# Patient Record
Sex: Female | Born: 1960 | Race: White | Hispanic: No | Smoking: Former smoker
Health system: Southern US, Community
[De-identification: ages and names within clinical notes are randomized; demographics above are authoritative.]

## PROBLEM LIST (undated history)

## (undated) DIAGNOSIS — K859 Acute pancreatitis without necrosis or infection, unspecified: Secondary | ICD-10-CM

## (undated) DIAGNOSIS — E161 Other hypoglycemia: Secondary | ICD-10-CM

## (undated) DIAGNOSIS — C7931 Secondary malignant neoplasm of brain: Secondary | ICD-10-CM

## (undated) DIAGNOSIS — R569 Unspecified convulsions: Secondary | ICD-10-CM

## (undated) DIAGNOSIS — E119 Type 2 diabetes mellitus without complications: Secondary | ICD-10-CM

## (undated) DIAGNOSIS — F419 Anxiety disorder, unspecified: Secondary | ICD-10-CM

## (undated) DIAGNOSIS — C3490 Malignant neoplasm of unspecified part of unspecified bronchus or lung: Secondary | ICD-10-CM

## (undated) DIAGNOSIS — L0291 Cutaneous abscess, unspecified: Secondary | ICD-10-CM

## (undated) DIAGNOSIS — R42 Dizziness and giddiness: Secondary | ICD-10-CM

## (undated) DIAGNOSIS — D3610 Benign neoplasm of peripheral nerves and autonomic nervous system, unspecified: Secondary | ICD-10-CM

## (undated) HISTORY — PX: SHOULDER SURGERY: SHX246

## (undated) HISTORY — DX: Cutaneous abscess, unspecified: L02.91

## (undated) HISTORY — DX: Anxiety disorder, unspecified: F41.9

## (undated) HISTORY — DX: Dizziness and giddiness: R42

## (undated) HISTORY — DX: Benign neoplasm of peripheral nerves and autonomic nervous system, unspecified: D36.10

---

## 1981-03-18 DIAGNOSIS — I10 Essential (primary) hypertension: Secondary | ICD-10-CM

## 1981-03-18 HISTORY — DX: Essential (primary) hypertension: I10

## 2002-04-10 ENCOUNTER — Emergency Department (HOSPITAL_COMMUNITY): Admission: EM | Admit: 2002-04-10 | Discharge: 2002-04-10 | Payer: Self-pay | Admitting: Emergency Medicine

## 2002-10-22 ENCOUNTER — Other Ambulatory Visit: Admission: RE | Admit: 2002-10-22 | Discharge: 2002-10-22 | Payer: Self-pay | Admitting: Family Medicine

## 2003-11-20 ENCOUNTER — Emergency Department (HOSPITAL_COMMUNITY): Admission: EM | Admit: 2003-11-20 | Discharge: 2003-11-20 | Payer: Self-pay | Admitting: Emergency Medicine

## 2004-03-18 HISTORY — PX: ABDOMINAL HYSTERECTOMY: SHX81

## 2004-09-17 ENCOUNTER — Other Ambulatory Visit: Admission: RE | Admit: 2004-09-17 | Discharge: 2004-09-17 | Payer: Self-pay | Admitting: *Deleted

## 2004-10-12 ENCOUNTER — Other Ambulatory Visit: Admission: RE | Admit: 2004-10-12 | Discharge: 2004-10-12 | Payer: Self-pay | Admitting: *Deleted

## 2004-10-23 ENCOUNTER — Encounter: Payer: Self-pay | Admitting: *Deleted

## 2004-10-24 ENCOUNTER — Observation Stay (HOSPITAL_COMMUNITY): Admission: AD | Admit: 2004-10-24 | Discharge: 2004-10-25 | Payer: Self-pay | Admitting: *Deleted

## 2004-10-24 ENCOUNTER — Encounter (INDEPENDENT_AMBULATORY_CARE_PROVIDER_SITE_OTHER): Payer: Self-pay | Admitting: Specialist

## 2005-01-24 ENCOUNTER — Ambulatory Visit: Payer: Self-pay | Admitting: Internal Medicine

## 2006-11-12 ENCOUNTER — Other Ambulatory Visit: Admission: RE | Admit: 2006-11-12 | Discharge: 2006-11-12 | Payer: Self-pay | Admitting: *Deleted

## 2010-04-07 ENCOUNTER — Encounter: Payer: Self-pay | Admitting: *Deleted

## 2010-08-03 NOTE — H&P (Signed)
Carolyn Hooper, Carolyn Hooper                ACCOUNT NO.:  [id number]   MEDICAL RECORD NO.:  [id number]           PATIENT TYPE:   LOCATION:                                 FACILITY:   PHYSICIAN:  Almedia Balls. Fore, M.D.   DATE OF BIRTH:  January 16, 1961   DATE OF ADMISSION:  DATE OF DISCHARGE:                                HISTORY & PHYSICAL   DATE OF ADMISSION:  Dictated on October 17, 2004 to be admitted on October 24, 2004.   CHIEF COMPLAINT:  Abnormal bleeding, pain, uterine enlargement.   HISTORY OF PRESENT ILLNESS:  The patient is a 50 year old gravida 2, para 1,  whose last menstrual period was September 23, 2004.  She was seen in our office on  September 17, 2004 with complaints of abnormal bleeding and continuing to bleed  daily over the past three to four weeks which had been progressively  worsening over the past year.  She underwent saline sonogram on September 21, 2004  which showed a uterus with increased size and probable myomata, increased  endometrial stripe.  Ovaries appeared normal.  She underwent hysteroscopy  and D and C on September 28, 2004 with benign endocervical changes and fragments  of proliferative endometrium.  She has continued to have abnormal bleeding  and examination with the hysteroscopy, D and C revealed the myomata present  in a submucous fashion.  She is admitted at this time for hysterectomy,  possible bilateral salpingo-oophorectomy.  Of note is that she has had two  Pap smears recently which showed atypical squamous cells of undetermined  significance, with the last one stating higher grade could not be excluded.  In light of the endocervical curettage which was quite thorough, done at the  time of D and C, and normal benign findings there, I believe it is safe to  proceed with hysterectomy.  The patient's uterus is quite high in the vagina  and her tolerance for vaginal examination, speculum examination is very low  so that colposcopy in the office would be not desirable.  She has  been fully  counseled as to the nature of the procedure and the risks involved to  include risks of anesthesia, injury to bowel, bladder, blood vessels,  ureters, postoperative hemorrhage, infection, recuperation.  She also has  been counseled as to the possibility of hormone replacement should her  ovaries be removed.  She fully understands all these considerations and  wishes to proceed on October 24, 2004.   PAST MEDICAL HISTORY:  Includes childbirth in June 1991 which was without  incident.  Evaluation for chest pain in November 2005 which included  multiple studies of cardiac status which were all normal.  She has taken  hormone suppression in an attempt to stop the abnormal bleeding.   ALLERGIES:  She is allergic to ADHESIVE TAPE.   HABITS:  She is a smoker, approximately one-half pack cigarettes per day.  Rare alcohol use.  High caffeine intake.   FAMILY HISTORY:  Includes father with insulin-dependent diabetes mellitus,  aunts and grandmother with diabetes mellitus.  Cardiovascular disease in her  father and maternal grandfather and several uncles.  Uncles on both sides  had colon cancer.   REVIEW OF SYSTEMS:  HEENT:  Negative.  CARDIORESPIRATORY:  As noted above.  GASTROINTESTINAL:  Negative.  GENITOURINARY:  As noted above.  NEUROMUSCULAR: Negative.   PHYSICAL EXAMINATION:  VITAL SIGNS:  Height 5 feet, 7-1/2 inches tall,  weight 291.  Blood pressure 136/80, pulse 84, respirations 18.  GENERAL APPEARANCE:  Well-developed white female in no acute distress.  HEENT:  Within normal limits.  NECK:  Supple without masses, adenopathy or bruits.  HEART:  Regular rate and rhythm without murmurs.  LUNGS:  Clear to auscultation and percussion.  BREASTS:  Examined sitting and lying without masses.  AXILLA:  Negative.  ABDOMEN:  Soft, without masses palpable, nontender except slight tenderness  in the right lower quadrant.  There is an increased panniculus present.  PELVIC:  External  genitalia, Bartholin's, Urethral, Skene's glands within  normal limits.  Vagina is clean.  Cervix is high in the vagina and is  slightly inflamed.  Uterus is mid position, somewhat of a difficult  examination but under anesthesia was felt to be enlarged to 14 to [redacted] weeks  gestational size and irregular.  Adnexa without palpable mass.  Anterior and  posterior cul-de-sac exam is confirmatory.  EXTREMITIES:  Within normal limits.  CNS:  Grossly intact.  SKIN:  Without suspicious lesions.   IMPRESSION:  1.  Abnormal uterine bleeding.  2.  Pelvic pain.  3.  Uterine enlargement.  4.  Intraepithelial lesion of cervix.   DISPOSITION:  As noted above.           ______________________________  Almedia Balls. [name], M.D.     SRF/MEDQ  D:  10/17/2004  T:  10/17/2004  Job:  [id number]

## 2010-08-03 NOTE — Discharge Summary (Signed)
NAMELARIZA, COTHRON                ACCOUNT NO.:  [id number]   MEDICAL RECORD NO.:  [id number]          PATIENT TYPE:  AMB   LOCATION:  DAY                          FACILITY:  [facility]   PHYSICIAN:  Almedia Balls. Fore, M.D.   DATE OF BIRTH:  Jun 16, 1960   DATE OF ADMISSION:  10/24/2004  DATE OF DISCHARGE:  10/25/2004                                 DISCHARGE SUMMARY   HISTORY:  The patient is a 50 year old with abnormal uterine bleeding,  uterine enlargement, pelvic pain, for hysterectomy, possible bilateral  salpingo-oophorectomy.  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  The remainder of her  history and physical are as previously dictated.   LABORATORY DATA:  Preoperative hemoglobin of 14.9.  Comprehensive metabolic  panel normal, although insulin was elevated at 174.  Chest x-ray was normal.   HOSPITAL COURSE:  The patient was taken to the operating room on October 24, 2004, at which time a transabdominal hysterectomy, right salpingo-  oophorectomy were performed.  The patient did well postoperatively.  Diet  and ambulation were progressed over the evening of August 9 and the early  morning of August 10.  On the morning of August 10 she was afebrile and  experiencing no problems, and it was felt that she could be discharged at  this time.   FINAL DIAGNOSES:  1.  Abnormal uterine bleeding.  2.  Uterine enlargement.  3.  Pelvic pain.  4.  Right ovarian cyst.  5.  Low-grade squamous intraepithelial lesion.   OPERATION:  Transabdominal hysterectomy, right salpingo-oophorectomy.   Pathology report unavailable at the time of discharge.   DISPOSITION:  Discharged home to return to the office in two weeks for  follow-up.  She was instructed to gradually progress her activities at home  and to limit lifting and driving for two weeks.  She was fully ambulatory,  on a regular diet, and in good condition at the time of  discharge.  She was  given a prescription for Percocet generic #30 to be taken one or two q.6h.  p.r.n. pain and doxycycline 100 mg #12 to be taken one b.i.d.       SRF/MEDQ  D:  10/25/2004  T:  10/25/2004  Job:  [id number]

## 2010-08-03 NOTE — Op Note (Signed)
[name], Carolyn Hooper                ACCOUNT NO.:  [id number]   MEDICAL RECORD NO.:  [id number]          Hooper TYPE:  AMB   LOCATION:  DAY                          FACILITY:  [facility]   PHYSICIAN:  Almedia Balls. Fore, M.D.   DATE OF BIRTH:  1960/11/24   DATE OF PROCEDURE:  10/24/2004  DATE OF DISCHARGE:                                 OPERATIVE REPORT   PREOPERATIVE DIAGNOSIS:  Abnormal uterine bleeding, pelvic pain, uterine  enlargement, low grade squamous intraepithelial cervical lesion.   POSTOPERATIVE DIAGNOSIS:  Abnormal uterine bleeding, pelvic pain, uterine  enlargement, low grade squamous intraepithelial cervical lesion. Pending  pathology.   OPERATION:  TAH/RSO.   ANESTHESIA:  General orotracheal.   SURGEON:  Almedia Balls. Carolyn Hooper, M.D.   FIRST ASSISTANT:  Leona Singleton, M.D.   INDICATIONS FOR SURGERY:  The Hooper is a 50 year old with the above-noted  problems who was counseled as to the need for surgery to treat these  problems. She was fully counseled as to mature of the procedure and the  risks involved to include risks of anesthesia, injury to bowel, bladder,  blood vessels, ureters, postoperative hemorrhage, infection, recuperation  and possible hormone replacement should her ovaries be removed. She fully  understands all these considerations and wishes to proceed on October 24, 2004.   OPERATIVE FINDINGS:  On entry into the abdomen, the uterus was noted to be  mid posterior, approximately 8-[redacted] weeks gestational size. There was what  appeared to be a corpus luteum cyst on the right ovary. The left ovary  appeared to be normal. Exploration of the upper abdomen revealed the lower  liver edge, gallbladder, spleen, kidneys, periaortic areas and appendix  areas to be normal to visualization and/or palpation.   DESCRIPTION OF PROCEDURE:  With the Hooper under general anesthesia,  prepared and draped in the usual sterile fashion with the Foley catheter in  the bladder, a lower  abdominal transverse incision was made and carried into  the peritoneal cavity without difficulty. A self-retaining retractor was  placed, and the bowel was packed off. The uterus was grasped with Kelly  clamps at the uterine ovarian anastomoses, tubes and round ligaments for  traction and hemostasis. Round ligaments were transected using Bovie  electrocoagulation with development of a bladder flap anteriorly and entry  into the retroperitoneal space. Because of the continued pain in her right  adnexal area and the apparent cystic structure present, it was felt that the  right ovary should be removed. Accordingly, the infundibulopelvic ligament  on the right was identified well above the course of the ureter and vessels,  clamped, cut, and doubly ligated with #1 chromic catgut. On the left, thee  utero-ovarian anastomosis and left tube were clamped; these structures were  then cut free with conservation of the of these structures and the pedicle  was doubly ligated with #1 chromic catgut. The uterine vessels were then  skeletonized, clamped, cut, and suture ligated bilaterally. The cardinal  ligaments bilaterally were then clamped, cut, and suture ligated with #1  chromic catgut. It was then possible to excise the cervix  using Bovie  electrocoagulation. The vaginal cuff was then rendered hemostatic with the  angle sutures of #1 chromic catgut and approximating sutures of interrupted  figure-of-eight type of #1 chromic catgut as well. The area was lavaged with  copious amounts of lactated Ringer solution, and after noting hemostasis was  maintained, the area was reperitonealized with a suture of #1 chromic  catgut. The left ovary was suspended from the round ligament with an  interrupted suture of #1 chromic catgut. At this point with good hemostasis  and a correct sponge and instrument count, the peritoneum was closed with a  continuous suture of #0 Vicryl. The fascia was closed with two  sutures of #0  Vicryl which were brought from the lateral aspects of the incision and tied  separately in the midline. Subcutaneous fat was reapproximated with  interrupted sutures of #0 Vicryl. The skin was closed with a subcuticular  suture of 3-0 plain catgut. Estimated blood loss 250 mL. The Hooper was  taken to the recovery room in good condition with clear urine in the Foley  catheter tubing. She will be placed on 23-hour observation following  surgery.       SRF/MEDQ  D:  10/24/2004  T:  10/24/2004  Job:  [id number]   cc:   Leona Singleton, M.D.  [address]  [place]  [place] [address]  Fax: [phone]

## 2011-04-07 ENCOUNTER — Encounter (HOSPITAL_COMMUNITY): Payer: Self-pay | Admitting: Emergency Medicine

## 2011-04-07 ENCOUNTER — Observation Stay (HOSPITAL_COMMUNITY)
Admission: EM | Admit: 2011-04-07 | Discharge: 2011-04-08 | Disposition: A | Discharge status: Home / Self Care | Payer: Self-pay | Attending: Emergency Medicine | Admitting: Emergency Medicine

## 2011-04-07 DIAGNOSIS — D333 Benign neoplasm of cranial nerves: Secondary | ICD-10-CM

## 2011-04-07 DIAGNOSIS — R42 Dizziness and giddiness: Secondary | ICD-10-CM | POA: Insufficient documentation

## 2011-04-07 DIAGNOSIS — E785 Hyperlipidemia, unspecified: Secondary | ICD-10-CM

## 2011-04-07 DIAGNOSIS — D140 Benign neoplasm of middle ear, nasal cavity and accessory sinuses: Principal | ICD-10-CM | POA: Insufficient documentation

## 2011-04-07 DIAGNOSIS — R269 Unspecified abnormalities of gait and mobility: Secondary | ICD-10-CM | POA: Insufficient documentation

## 2011-04-07 HISTORY — DX: Other hypoglycemia: E16.1

## 2011-04-07 MED ORDER — MECLIZINE HCL 25 MG PO TABS
25.0000 mg | ORAL_TABLET | Freq: Once | ORAL | Status: AC
  Administered 2011-04-07: 25 mg via ORAL
  Filled 2011-04-07: qty 1

## 2011-04-07 NOTE — ED Provider Notes (Signed)
History     CSN: [id number]  Arrival date & time 04/07/11  2244   First MD Initiated Contact with Patient 04/07/11 2304      Chief Complaint  Patient presents with  . Dizziness    (Consider location/radiation/quality/duration/timing/severity/associated sxs/prior treatment) HPI Patient presents with complaint of dizziness and sensation of spinning. She states that for the past 2 days she has noted mild "seasick feeling with some sensation of movement. The symptoms became worse tonight at 8:30 PM while she was getting ready to stand from a seated position. She describes a spinning sensation and changes in her balance. Symptoms lasted approximately 2 hours and upon my evaluation in the ED had mostly resolved. She did have nausea and vomiting associated with 4 episodes of emesis. Emesis was nonbloody and nonbilious. Prior to the symptoms she has been having nasal congestion and cough for the past 2 weeks she has had a productive cough and postnasal drainage. There's been no fever or chills or shortness of breath. She denies any headache. She has no weakness or numbness in her extremities and no diplopia. Movement make her symptoms worse and lying flat make symptoms better. There no other associated systemic symptoms. No other alleviating or modifying factors.  Past Medical History  Diagnosis Date  . Hyperinsulinemia     No past surgical history on file.  No family history on file.  History  Substance Use Topics  . Smoking status: Current Everyday Smoker -- 1.0 packs/day  . Smokeless tobacco: Not on file  . Alcohol Use:     OB History    Grav Para Term Preterm Abortions TAB SAB Ect Mult Living                  Review of Systems ROS reviewed and otherwise negative except for mentioned in HPI  Allergies  Adhesive  Home Medications   Current Outpatient Rx  Name Route Sig Dispense Refill  . CLONAZEPAM 0.5 MG PO TABS Oral Take 0.25 mg by mouth daily as needed. For anxiety      . ZICAM COLD REMEDY PO Oral Take 1 tablet by mouth every 6 (six) hours as needed. For cold symptoms    . METFORMIN HCL 850 MG PO TABS Oral Take 850 mg by mouth 2 (two) times daily with a meal.    . ALKA-SELTZER PLUS COLD & COUGH PO Oral Take 2 tablets by mouth every 6 (six) hours as needed. For cold symptoms    . ATORVASTATIN CALCIUM 10 MG PO TABS Oral Take 1 tablet (10 mg total) by mouth daily. 30 tablet 0  . MECLIZINE HCL 25 MG PO TABS Oral Take 1 tablet (25 mg total) by mouth 3 (three) times daily as needed for dizziness or nausea. 30 tablet 0    BP 117/65  Pulse 74  Temp(Src) 98.2 F (36.8 C) (Oral)  Resp 12  SpO2 99% Vitals reviewed Physical Exam Physical Examination: General appearance - alert, well appearing, and in no distress Mental status - alert, oriented to person, place, and time Eyes - pupils equal and reactive, extraocular eye movements intact, no nystagmus Mouth - mucous membranes moist, pharynx normal without lesions Chest - clear to auscultation, no wheezes, rales or rhonchi, symmetric air entry Heart - normal rate, regular rhythm, normal S1, S2, no murmurs, rubs, clicks or gallops Abdomen - soft, nontender, nondistended, no masses or organomegaly Neurological - alert, oriented, normal speech, cranial nerves 2-12 tested and intact, strength 5/5 in extremities x 4, negative  rhomberg, gait somewhat unsteady due to sensation of vertigo with movement, finger to nose testing intact Musculoskeletal - no joint tenderness, deformity or swelling Extremities - peripheral pulses normal, no pedal edema, no clubbing or cyanosis Skin - normal coloration and turgor, no rashes  ED Course  Procedures (including critical care time)  Date: 04/08/2011  Rate: 63  Rhythm: normal sinus rhythm  QRS Axis: normal  Intervals: normal  ST/T Wave abnormalities: nonspecific T wave changes  Conduction Disutrbances:incomplete right bundle branch block  Narrative Interpretation:   Old EKG  Reviewed: no significant changes from 10/23/04    Labs Reviewed  CBC - Abnormal; Notable for the following:    WBC 15.5 (*)    RBC 5.54 (*)    Hemoglobin 16.3 (*)    HCT 48.1 (*)    All other components within normal limits  BASIC METABOLIC PANEL - Abnormal; Notable for the following:    Glucose, Bld 119 (*)    All other components within normal limits  COMPREHENSIVE METABOLIC PANEL - Abnormal; Notable for the following:    Glucose, Bld 101 (*)    Total Bilirubin 0.2 (*)    All other components within normal limits  HEMOGLOBIN A1C - Abnormal; Notable for the following:    Hemoglobin A1C 5.8 (*)    Mean Plasma Glucose 120 (*)    All other components within normal limits  LIPID PANEL - Abnormal; Notable for the following:    Cholesterol 230 (*)    LDL Cholesterol 156 (*)    All other components within normal limits  GLUCOSE, CAPILLARY - Abnormal; Notable for the following:    Glucose-Capillary 128 (*)    All other components within normal limits  GLUCOSE, CAPILLARY - Abnormal; Notable for the following:    Glucose-Capillary 117 (*)    All other components within normal limits  GLUCOSE, POCT (MANUAL RESULT ENTRY) - Normal  URINALYSIS, ROUTINE W REFLEX MICROSCOPIC  PROTIME-INR  APTT  CARDIAC PANEL(CRET KIN+CKTOT+MB+TROPI)  URINE RAPID DRUG SCREEN (HOSP PERFORMED)   Ct Head Wo Contrast  04/08/2011  *RADIOLOGY REPORT*  Clinical Data: Dizziness and nausea.  CT HEAD WITHOUT CONTRAST  Technique:  Contiguous axial images were obtained from the base of the skull through the vertex without contrast.  Comparison: None.  Findings: There is no evidence of acute infarction, mass lesion, or intra- or extra-axial hemorrhage on CT.  The posterior fossa, including the cerebellum, brainstem and fourth ventricle, is within normal limits.  The third and lateral ventricles, and basal ganglia are unremarkable in appearance.  The cerebral hemispheres are symmetric in appearance, with normal gray- white  differentiation.  No mass effect or midline shift is seen.  There is no evidence of fracture; visualized osseous structures are unremarkable in appearance.  The orbits are within normal limits. The paranasal sinuses and mastoid air cells are well-aerated.  No significant soft tissue abnormalities are seen.  Cerumen is noted filling the left external auditory canal.  IMPRESSION:  1.  No acute intracranial pathology seen on CT. 2.  Cerumen noted filling the left external auditory canal.  Original Report Authenticated By: Tonia Ghent, M.D.   Mr Angiogram Almyra Deforest Wo Contrast  04/08/2011  *RADIOLOGY REPORT*  Clinical Data:  51 year old female with vertigo, TIA.  Comparison: Head CT without contrast 04/08/2011.  MRI HEAD WITHOUT AND WITH CONTRAST  Technique: Multiplanar, multiecho pulse sequences of the brain and surrounding structures were obtained according to standard protocol without and with intravenous contrast.  Contrast:  20  ml MultiHance.  Findings:  No restricted diffusion to suggest acute infarction.  No midline shift, mass effect,ventriculomegaly, extra-axial collection or acute intracranial hemorrhage.  Cervicomedullary junction and pituitary are within normal limits.  Major intracranial vascular flow voids are preserved.  Wallace Cullens and white matter signal is within normal limits throughout the brain.  Normal cerebral volume.  Negative visualized cervical spine.  Hyperostosis of the calvarium. Otherwise normal bone marrow signal.  Rounded 6 x 6 x 7 mm enhancing mass in the right internal auditory canal appears associated with the intracanilicular right seventh and eighth nerves segments.  No extension into the right cerebellopontine angle or mass effect on the brainstem.  T2 signal is preserved in the right cochlea and vestibular apparatus.  The left side visualized internal auditory structures are grossly normal.  No other abnormal enhancement.  Visualized orbit soft tissues are within normal limits.  Minimal  fluid signal in the left mastoid air cells.  Minimal mucosal thickening in the right maxillary sinus.  Negative scalp soft tissues.  IMPRESSION: 1.  Right IAC vestibular schwannoma, 6 x 6 x 7 mm. 2.  No other acute intracranial abnormality and otherwise negative MRI appearance of the brain. 3.  See MRA findings below. 4.  Minor mastoid and paranasal sinus inflammatory change.  MRA HEAD WITHOUT CONTRAST  Technique: Angiographic images of the Circle of Willis were obtained using MRA technique without  intravenous contrast.  Findings:  Antegrade flow in the posterior circulation.  Codominant distal vertebral arteries.  Normal left PICA.  Dominant, and duplicated, right AICA. A right AICA loop appears partially extend into the right IAC.  Similarly, there is a left AICA loop in the left IAC.  Normal vertebrobasilar junction.  Mild basilar artery irregularity without stenosis.  Superior cerebellar arteries and PCA origins are normal.  Posterior communicating arteries are diminutive or absent.  Bilateral PCA branches are within normal limits.  Antegrade flow in both ICA siphons.  Normal ophthalmic artery origins.  Normal carotid termini, MCA and ACA origins.  Tortuous but otherwise normal anterior communicating artery.  Visualized ACA branches are within normal limits.  Visualized bilateral MCA branches are within normal limits.  IMPRESSION: 1.  Note that a right AICA loop partially extends into the right IAC. 2.  Negative intracranial MRA.  Original Report Authenticated By: Harley Hallmark, M.D.   Mr Laqueta Jean Wo Contrast  04/08/2011  *RADIOLOGY REPORT*  Clinical Data:  51 year old female with vertigo, TIA.  Comparison: Head CT without contrast 04/08/2011.  MRI HEAD WITHOUT AND WITH CONTRAST  Technique: Multiplanar, multiecho pulse sequences of the brain and surrounding structures were obtained according to standard protocol without and with intravenous contrast.  Contrast:  20 ml MultiHance.  Findings:  No restricted  diffusion to suggest acute infarction.  No midline shift, mass effect,ventriculomegaly, extra-axial collection or acute intracranial hemorrhage.  Cervicomedullary junction and pituitary are within normal limits.  Major intracranial vascular flow voids are preserved.  Wallace Cullens and white matter signal is within normal limits throughout the brain.  Normal cerebral volume.  Negative visualized cervical spine.  Hyperostosis of the calvarium. Otherwise normal bone marrow signal.  Rounded 6 x 6 x 7 mm enhancing mass in the right internal auditory canal appears associated with the intracanilicular right seventh and eighth nerves segments.  No extension into the right cerebellopontine angle or mass effect on the brainstem.  T2 signal is preserved in the right cochlea and vestibular apparatus.  The left side visualized internal auditory structures are grossly  normal.  No other abnormal enhancement.  Visualized orbit soft tissues are within normal limits.  Minimal fluid signal in the left mastoid air cells.  Minimal mucosal thickening in the right maxillary sinus.  Negative scalp soft tissues.  IMPRESSION: 1.  Right IAC vestibular schwannoma, 6 x 6 x 7 mm. 2.  No other acute intracranial abnormality and otherwise negative MRI appearance of the brain. 3.  See MRA findings below. 4.  Minor mastoid and paranasal sinus inflammatory change.  MRA HEAD WITHOUT CONTRAST  Technique: Angiographic images of the Circle of Willis were obtained using MRA technique without  intravenous contrast.  Findings:  Antegrade flow in the posterior circulation.  Codominant distal vertebral arteries.  Normal left PICA.  Dominant, and duplicated, right AICA. A right AICA loop appears partially extend into the right IAC.  Similarly, there is a left AICA loop in the left IAC.  Normal vertebrobasilar junction.  Mild basilar artery irregularity without stenosis.  Superior cerebellar arteries and PCA origins are normal.  Posterior communicating arteries are  diminutive or absent.  Bilateral PCA branches are within normal limits.  Antegrade flow in both ICA siphons.  Normal ophthalmic artery origins.  Normal carotid termini, MCA and ACA origins.  Tortuous but otherwise normal anterior communicating artery.  Visualized ACA branches are within normal limits.  Visualized bilateral MCA branches are within normal limits.  IMPRESSION: 1.  Note that a right AICA loop partially extends into the right IAC. 2.  Negative intracranial MRA.  Original Report Authenticated By: Harley Hallmark, M.D.     1. Vestibular schwannoma   2. Hyperlipidemia       MDM  Patient presenting with symptoms of vertigo with nausea and vomiting. Patient states symptoms began couple of days ago but became acutely worse last night with several episodes of emesis before arrival. Patient has a normal neurologic exam including cranial nerve testing however upon ambulating patient she was somewhat unsteady in her gait and states that she felt the recurrence of sensation of movement. She had no nystagmus on exam but Dix-Hallpike testing was positive. I primarily suspect a peripheral cause of her vertigo. Her symptoms were minimally improved after meclizine.  Head CT was reassuring.  Pt was placed on TIA observation protocol to have MRI and ensure no CVA.        Ethelda Chick, MD 04/08/11 [phone]

## 2011-04-07 NOTE — ED Notes (Signed)
EMS VITALS: BP:146/77 Pulse: 67 Resp: 20 CBG: 114 O2: 98%

## 2011-04-07 NOTE — ED Notes (Signed)
PER EMS- Pt has been complaining of vertigo since 20:30 this evening. Vomited four times since. States that she feels like the room is spinning. Pt also c/o sinus infection that has been present for two weeks. Producing green sputum.

## 2011-04-08 ENCOUNTER — Observation Stay (HOSPITAL_COMMUNITY): Payer: Self-pay

## 2011-04-08 ENCOUNTER — Other Ambulatory Visit: Payer: Self-pay

## 2011-04-08 ENCOUNTER — Emergency Department (HOSPITAL_COMMUNITY): Payer: Self-pay

## 2011-04-08 DIAGNOSIS — R0989 Other specified symptoms and signs involving the circulatory and respiratory systems: Secondary | ICD-10-CM

## 2011-04-08 DIAGNOSIS — R42 Dizziness and giddiness: Secondary | ICD-10-CM

## 2011-04-08 LAB — COMPREHENSIVE METABOLIC PANEL
AST: 21 U/L (ref 0–37)
BUN: 15 mg/dL (ref 6–23)
CO2: 28 mEq/L (ref 19–32)
Calcium: 10.3 mg/dL (ref 8.4–10.5)
Creatinine, Ser: 0.75 mg/dL (ref 0.50–1.10)
GFR calc Af Amer: >90 mL/min (ref >90)
GFR calc non Af Amer: >90 mL/min (ref >90)

## 2011-04-08 LAB — BASIC METABOLIC PANEL
BUN: 15 mg/dL (ref 6–23)
CO2: 27 mEq/L (ref 19–32)
Chloride: 99 mEq/L (ref 96–112)
GFR calc non Af Amer: >90 mL/min (ref >90)
Glucose, Bld: 119 mg/dL — ABNORMAL HIGH (ref 70–99)
Potassium: 3.9 mEq/L (ref 3.5–5.1)
Sodium: 140 mEq/L (ref 135–145)

## 2011-04-08 LAB — CBC
HCT: 48.1 % — ABNORMAL HIGH (ref 36.0–46.0)
Hemoglobin: 16.3 g/dL — ABNORMAL HIGH (ref 12.0–15.0)
MCHC: 33.9 g/dL (ref 30.0–36.0)
RBC: 5.54 MIL/uL — ABNORMAL HIGH (ref 3.87–5.11)

## 2011-04-08 LAB — URINALYSIS, ROUTINE W REFLEX MICROSCOPIC
Bilirubin Urine: NEGATIVE
Glucose, UA: NEGATIVE mg/dL
Hgb urine dipstick: NEGATIVE
Specific Gravity, Urine: 1.016 (ref 1.005–1.030)
Urobilinogen, UA: 0.2 mg/dL (ref 0.0–1.0)
pH: 8 (ref 5.0–8.0)

## 2011-04-08 LAB — LIPID PANEL
Cholesterol: 230 mg/dL — ABNORMAL HIGH (ref 0–200)
HDL: 49 mg/dL (ref >39)
Total CHOL/HDL Ratio: 4.7 RATIO
Triglycerides: 126 mg/dL (ref <150)
VLDL: 25 mg/dL (ref 0–40)

## 2011-04-08 LAB — GLUCOSE, POCT (MANUAL RESULT ENTRY): POC Glucose: 117

## 2011-04-08 LAB — GLUCOSE, CAPILLARY
Glucose-Capillary: 128 mg/dL — ABNORMAL HIGH (ref 70–99)
Glucose-Capillary: 117 mg/dL — ABNORMAL HIGH (ref 70–99)

## 2011-04-08 LAB — RAPID URINE DRUG SCREEN, HOSP PERFORMED
Barbiturates: NOT DETECTED
Tetrahydrocannabinol: NOT DETECTED

## 2011-04-08 LAB — PROTIME-INR
INR: 0.92 (ref 0.00–1.49)
Prothrombin Time: 12.6 seconds (ref 11.6–15.2)

## 2011-04-08 LAB — CARDIAC PANEL(CRET KIN+CKTOT+MB+TROPI)
Relative Index: 1.8 (ref 0.0–2.5)
Total CK: 111 U/L (ref 7–177)

## 2011-04-08 LAB — APTT: aPTT: 32 seconds (ref 24–37)

## 2011-04-08 MED ORDER — ASPIRIN 81 MG PO CHEW: 81.0000 mg | CHEWABLE_TABLET | Freq: Once | ORAL | Status: DC

## 2011-04-08 MED ORDER — MECLIZINE HCL 25 MG PO TABS: 25.0000 mg | ORAL_TABLET | Freq: Three times a day (TID) | ORAL | Status: AC | PRN

## 2011-04-08 MED ORDER — ACETAMINOPHEN 325 MG PO TABS: 650.0000 mg | ORAL_TABLET | ORAL | Status: DC | PRN

## 2011-04-08 MED ORDER — SODIUM CHLORIDE 0.9 % IV SOLN
INTRAVENOUS | Status: DC
  Administered 2011-04-08: 04:00:00 via INTRAVENOUS

## 2011-04-08 MED ORDER — ATORVASTATIN CALCIUM 10 MG PO TABS: 10.0000 mg | ORAL_TABLET | Freq: Every day | ORAL | Status: DC

## 2011-04-08 MED ORDER — GADOBENATE DIMEGLUMINE 529 MG/ML IV SOLN: 20.0000 mL | Freq: Once | INTRAVENOUS | Status: DC

## 2011-04-08 MED ORDER — MECLIZINE HCL 25 MG PO TABS
25.0000 mg | ORAL_TABLET | Freq: Three times a day (TID) | ORAL | Status: DC | PRN
  Administered 2011-04-08: 25 mg via ORAL
  Filled 2011-04-08: qty 1

## 2011-04-08 NOTE — Progress Notes (Signed)
  Echocardiogram 2D Echocardiogram has been performed.  Cathie Beams Deneen 04/08/2011, 9:10 AM

## 2011-04-08 NOTE — ED Notes (Signed)
Pt returned from vascular lab. Requested something to eat. Ok per [employer]. Diet tray ordered.

## 2011-04-08 NOTE — ED Notes (Signed)
Patient transported to MRI via stretcher.

## 2011-04-08 NOTE — Progress Notes (Signed)
VASCULAR LAB PRELIMINARY  PRELIMINARY  PRELIMINARY  PRELIMINARY  Carotid duplex completed.    Preliminary report:  Bilateral:  No evidence of hemodynamically significant internal carotid artery stenosis.   Vertebral artery flow is antegrade.      Terance Hart, RVT 04/08/2011, 9:43 AM

## 2011-04-08 NOTE — ED Provider Notes (Signed)
Medical screening examination/treatment/procedure(s) were conducted as a shared visit with non-physician practitioner(s) and myself.  I personally evaluated the patient during the encounter  Pt seen primarily by me  Ethelda Chick, MD 04/08/11 [phone]

## 2011-04-08 NOTE — ED Notes (Signed)
Patient transported to MRI 

## 2011-04-08 NOTE — Progress Notes (Signed)
Observation review is complete. 

## 2011-04-08 NOTE — ED Provider Notes (Signed)
8:14 AM Patient is in CDU under observation, TIA protocol.  Patient with three days of increasing sensation of the floor wobbling and room spinning, gait imbalance.  Patient reports improvement of her symptoms but continues to have some unsteadiness of gait and visual changes such as the room "pulsing."  On exam, pt is A&Ox4, NAD, RRR, CTAB, CN III-XII intact, EOMs intact, few beats of horizontal nystagmus to the left, no pronator drift, grip strengths equal, motor and sensation intact in all extremities; finger to nose, rapid alternating movements normal.  Will continue to follow.    Patient found to have vestibular schwannoma.  I discussed the results with Dr Ignacia Palma, the patient, and Dr Jordan Likes (neurosurgery).  Dr Jordan Likes recommends d/c home with meclizine, follow up in his office in a few weeks.  Discussed all results with patient, pt verbalizes understanding.  Patient given prescription for statin due to hypercholesterolemia and prescription for meclizine, follow up with Dr Jordan Likes.  Patient agrees with plan.  Patient feeling much better on discharge, only mild, intermittent sensation of tilting or gait imbalance.  Pt ambulating easily without assistance at discharge.    Dillard Cannon Tancred, [place] 04/08/11 [phone]

## 2012-10-19 ENCOUNTER — Other Ambulatory Visit: Payer: Self-pay | Admitting: *Deleted

## 2012-10-19 MED ORDER — METFORMIN HCL 850 MG PO TABS: 850.0000 mg | ORAL_TABLET | Freq: Two times a day (BID) | ORAL | Status: DC

## 2012-10-30 ENCOUNTER — Telehealth: Payer: Self-pay | Admitting: Nurse Practitioner

## 2012-10-30 MED ORDER — CLONAZEPAM 0.5 MG PO TABS: 0.2500 mg | ORAL_TABLET | Freq: Two times a day (BID) | ORAL | Status: DC | PRN

## 2012-10-30 NOTE — Telephone Encounter (Signed)
Call in rx for klonopin 0.5 1 PO BID PRN #60 1 refill

## 2012-10-30 NOTE — Telephone Encounter (Signed)
RX up front

## 2012-12-02 ENCOUNTER — Other Ambulatory Visit: Payer: Self-pay | Admitting: Family Medicine

## 2012-12-03 NOTE — Telephone Encounter (Signed)
Last seen and last glucose 03/02/12  [facility]

## 2013-01-18 ENCOUNTER — Other Ambulatory Visit: Payer: Self-pay | Admitting: *Deleted

## 2013-01-18 MED ORDER — METFORMIN HCL 850 MG PO TABS: ORAL_TABLET | ORAL | Status: DC

## 2013-01-18 NOTE — Telephone Encounter (Signed)
Patient notified at last refill that NTBS. Please advise 

## 2013-01-18 NOTE — Telephone Encounter (Signed)
No more refills after this one without being seen 

## 2013-05-11 ENCOUNTER — Other Ambulatory Visit: Payer: Self-pay | Admitting: Nurse Practitioner

## 2013-05-18 ENCOUNTER — Telehealth: Payer: Self-pay | Admitting: Nurse Practitioner

## 2013-05-20 ENCOUNTER — Other Ambulatory Visit: Payer: Self-pay | Admitting: Nurse Practitioner

## 2013-05-20 MED ORDER — CLONAZEPAM 0.5 MG PO TABS: 0.2500 mg | ORAL_TABLET | Freq: Two times a day (BID) | ORAL | Status: DC | PRN

## 2013-05-20 MED ORDER — METFORMIN HCL 850 MG PO TABS: ORAL_TABLET | ORAL | Status: DC

## 2013-05-20 NOTE — Telephone Encounter (Signed)
Patient aware.

## 2013-05-20 NOTE — Telephone Encounter (Signed)
FYI - Patient just called and paid the $42.00 over the phone.  I put receipt in Cathy's box.

## 2013-05-20 NOTE — Telephone Encounter (Signed)
Please call in clonazepam 0.5 mg bid #60  with 0 refills Patient NTBS for follow up and lab work

## 2013-05-20 NOTE — Telephone Encounter (Signed)
Please find out from [employer] what to do- patient has been dismissed.

## 2013-05-20 NOTE — Telephone Encounter (Signed)
Patient NTBS for follow up and lab work  

## 2013-05-20 NOTE — Telephone Encounter (Signed)
Sent certified dismissal on 02/18/13 for $42. She did sign for it. She is past her 30 days that the letter states, but we can work on this, if you are wanting to ok 28month worth & her be seen & pay her bill in April will be fine.

## 2013-06-25 ENCOUNTER — Telehealth: Payer: Self-pay | Admitting: Nurse Practitioner

## 2013-06-25 MED ORDER — METFORMIN HCL 850 MG PO TABS: ORAL_TABLET | ORAL | Status: DC

## 2013-06-25 NOTE — Telephone Encounter (Signed)
Patient will need to be seen since it has been longer than one year since her last office visit. The lab orders have to be attached to a visit. There are discounts available for self pay patients for the office visit and through the laboratory processing facility.  Patient made aware. She would like an estimate of cost but was unable to speak with a billing specialist at this time. She will call back when she has time to talk.  I do not have any appointments available on Tuesday or Wednesday of next week which are the only days she can come in.  I did provide another refill of Metformin but stressed to the patient that this would be the last time we could fill this without her being seen. We refilled this last month and she was told she needed to be seen. She wasn't able to schedule an appointment while on the phone because she doesn't know her schedule. I explained that she will need to call back ASAP though because if she waits too long we will run into the same problem again that we would not be able to refill this again. She has enough clonazepam to last until she can come in.

## 2013-08-02 ENCOUNTER — Telehealth: Payer: Self-pay | Admitting: Family

## 2013-08-02 MED ORDER — METFORMIN HCL 850 MG PO TABS: ORAL_TABLET | ORAL | Status: DC

## 2013-08-02 NOTE — Telephone Encounter (Signed)
DONE

## 2013-08-19 ENCOUNTER — Encounter: Payer: Self-pay | Admitting: Family

## 2013-08-19 ENCOUNTER — Ambulatory Visit: Payer: Self-pay | Admitting: Family

## 2013-08-19 VITALS — BP 136/83 | HR 85 | Temp 98.3°F | Ht 68.0 in | Wt 296.0 lb

## 2013-08-19 DIAGNOSIS — F411 Generalized anxiety disorder: Secondary | ICD-10-CM | POA: Insufficient documentation

## 2013-08-19 DIAGNOSIS — E119 Type 2 diabetes mellitus without complications: Secondary | ICD-10-CM | POA: Insufficient documentation

## 2013-08-19 DIAGNOSIS — E1165 Type 2 diabetes mellitus with hyperglycemia: Secondary | ICD-10-CM | POA: Insufficient documentation

## 2013-08-19 DIAGNOSIS — E161 Other hypoglycemia: Secondary | ICD-10-CM

## 2013-08-19 DIAGNOSIS — Z0000 Encounter for general adult medical examination without abnormal findings: Secondary | ICD-10-CM

## 2013-08-19 LAB — POCT GLYCOSYLATED HEMOGLOBIN (HGB A1C): Hemoglobin A1C: 5.7

## 2013-08-19 MED ORDER — CLONAZEPAM 0.5 MG PO TABS: 0.2500 mg | ORAL_TABLET | Freq: Two times a day (BID) | ORAL | Status: DC | PRN

## 2013-08-19 MED ORDER — METFORMIN HCL 850 MG PO TABS: ORAL_TABLET | ORAL | Status: DC

## 2013-08-19 NOTE — Patient Instructions (Signed)

## 2013-08-19 NOTE — Progress Notes (Signed)
Subjective:    Patient ID: Carolyn Hooper, female    DOB: 1960/09/07, 53 y.o.   MRN: [id number]  Diabetes She presents for her follow-up diabetic visit. She has type 2 diabetes mellitus. Her disease course has been stable. Pertinent negatives for hypoglycemia include no confusion, dizziness or nervousness/anxiousness. Pertinent negatives for diabetes include no blurred vision, no foot paresthesias, no foot ulcerations and no visual change. There are no hypoglycemic complications. Symptoms are stable. There are no diabetic complications. Risk factors for coronary artery disease include diabetes mellitus, dyslipidemia, sedentary lifestyle, post-menopausal and family history. Current diabetic treatment includes oral agent (monotherapy). She is compliant with treatment all of the time. Her weight is stable. She is following a generally healthy diet. An ACE inhibitor/angiotensin II receptor blocker is not being taken. She does not see a podiatrist.Eye exam is not current (Pt educated on getting yearly exam- Last 2002).  Anxiety Presents for follow-up visit. Onset was 1 to 6 months ago. The problem has been resolved. Patient reports no confusion, dizziness, excessive worry, insomnia, nervous/anxious behavior, palpitations or restlessness. Symptoms occur rarely. The severity of symptoms is mild. The quality of sleep is good.   Past treatments include benzodiazephines. The treatment provided moderate relief. Compliance with prior treatments has been good.      Review of Systems  HENT: Negative.   Eyes: Negative for blurred vision.  Respiratory: Negative.   Cardiovascular: Negative.  Negative for palpitations.  Gastrointestinal: Negative.   Genitourinary: Negative.   Musculoskeletal: Negative.   Neurological: Negative for dizziness.  Psychiatric/Behavioral: Negative for confusion. The patient is not nervous/anxious and does not have insomnia.   All other systems reviewed and are negative.        Objective:   Physical Exam  Vitals reviewed. Constitutional: She is oriented to person, place, and time. She appears well-developed and well-nourished. No distress.  HENT:  Head: Normocephalic and atraumatic.  Right Ear: External ear normal.  Mouth/Throat: Oropharynx is clear and moist.  Eyes: Pupils are equal, round, and reactive to light.  Neck: Normal range of motion. Neck supple. No thyromegaly present.  Cardiovascular: Normal rate, regular rhythm, normal heart sounds and intact distal pulses.   No murmur heard. Pulmonary/Chest: Effort normal. No respiratory distress. She has no wheezes.  Breath sounds diminished   Abdominal: Soft. Bowel sounds are normal. She exhibits no distension. There is no tenderness.  Musculoskeletal: Normal range of motion. She exhibits no edema and no tenderness.  Neurological: She is alert and oriented to person, place, and time. She has normal reflexes. No cranial nerve deficit.  Skin: Skin is warm and dry.  Psychiatric: She has a normal mood and affect. Her behavior is normal. Judgment and thought content normal.      BP 136/83  Pulse 85  Temp(Src) 98.3 F (36.8 C) (Oral)  Ht '5\' 8"'  (1.727 m)  Wt 296 lb (134.265 kg)  BMI 45.02 kg/m2     Assessment & Plan:  1. Generalized anxiety disorder - clonazePAM (KLONOPIN) 0.5 MG tablet; Take 0.5 tablets (0.25 mg total) by mouth 2 (two) times daily as needed. For anxiety  Dispense: 60 tablet; Refill: 0  3. Hyperinsulinemia - metFORMIN (GLUCOPHAGE) 850 MG tablet; TAKE ONE TABLET BY MOUTH TWICE DAILY WITH A MEAL  Dispense: 60 tablet; Refill: 0 - POCT glycosylated hemoglobin (Hb A1C) - Insulin, fasting  4. Annual physical exam - BMP8+EGFR - Lipid panel  Meds ordered this encounter  Medications  . DISCONTD: clonazePAM (KLONOPIN) 0.5 MG tablet  Sig: Take 0.5 tablets (0.25 mg total) by mouth 2 (two) times daily as needed. For anxiety    Dispense:  60 tablet    Refill:  0    Order Specific  Question:  Supervising Provider    Answer:  Chipper Herb [[id number]]  . DISCONTD: metFORMIN (GLUCOPHAGE) 850 MG tablet    Sig: TAKE ONE TABLET BY MOUTH TWICE DAILY WITH A MEAL    Dispense:  60 tablet    Refill:  0    NTBS    Order Specific Question:  Supervising Provider    Answer:  Chipper Herb [[id number]]  . metFORMIN (GLUCOPHAGE) 850 MG tablet    Sig: TAKE ONE TABLET BY MOUTH TWICE DAILY WITH A MEAL    Dispense:  180 tablet    Refill:  4    Order Specific Question:  Supervising Provider    Answer:  Chipper Herb [[id number]]  . clonazePAM (KLONOPIN) 0.5 MG tablet    Sig: Take 0.5 tablets (0.25 mg total) by mouth 2 (two) times daily as needed. For anxiety    Dispense:  60 tablet    Refill:  0    Order Specific Question:  Supervising Provider    Answer:  Joycelyn Man     Continue all meds Labs pending Health Maintenance reviewed Diet and exercise encouraged RTO 1 year  Carolyn Dun, FNP

## 2013-08-20 LAB — BMP8+EGFR
BUN / CREAT RATIO: 10 (ref 9–23)
BUN: 9 mg/dL (ref 6–24)
CALCIUM: 10 mg/dL (ref 8.7–10.2)
CO2: 28 mmol/L (ref 18–29)
CREATININE: 0.91 mg/dL (ref 0.57–1.00)
Chloride: 101 mmol/L (ref 97–108)
GFR, EST AFRICAN AMERICAN: 83 mL/min/{1.73_m2} (ref >59)
GFR, EST NON AFRICAN AMERICAN: 72 mL/min/{1.73_m2} (ref >59)
Glucose: 129 mg/dL — ABNORMAL HIGH (ref 65–99)
Potassium: 5.4 mmol/L — ABNORMAL HIGH (ref 3.5–5.2)
SODIUM: 144 mmol/L (ref 134–144)

## 2013-08-20 LAB — LIPID PANEL
CHOLESTEROL TOTAL: 203 mg/dL — AB (ref 100–199)
Chol/HDL Ratio: 5.2 ratio units — ABNORMAL HIGH (ref 0.0–4.4)
HDL: 39 mg/dL — ABNORMAL LOW (ref >39)
LDL CALC: 137 mg/dL — AB (ref 0–99)
TRIGLYCERIDES: 137 mg/dL (ref 0–149)
VLDL CHOLESTEROL CAL: 27 mg/dL (ref 5–40)

## 2013-08-20 LAB — INSULIN, FASTING: INSULIN: 37 u[IU]/mL — ABNORMAL HIGH (ref 2.6–24.9)

## 2013-08-23 ENCOUNTER — Other Ambulatory Visit: Payer: Self-pay | Admitting: Family

## 2013-08-23 MED ORDER — PRAVASTATIN SODIUM 20 MG PO TABS: 20.0000 mg | ORAL_TABLET | Freq: Every day | ORAL | Status: DC

## 2013-08-23 MED ORDER — METFORMIN HCL 1000 MG PO TABS: 1000.0000 mg | ORAL_TABLET | Freq: Two times a day (BID) | ORAL | Status: DC

## 2013-11-08 ENCOUNTER — Encounter: Payer: Self-pay | Admitting: Family Medicine

## 2013-11-08 ENCOUNTER — Ambulatory Visit: Payer: Self-pay | Admitting: Family Medicine

## 2013-11-08 VITALS — BP 139/83 | HR 63 | Temp 97.3°F | Ht 68.0 in | Wt 296.0 lb

## 2013-11-08 DIAGNOSIS — L0390 Cellulitis, unspecified: Secondary | ICD-10-CM

## 2013-11-08 DIAGNOSIS — E161 Other hypoglycemia: Secondary | ICD-10-CM

## 2013-11-08 DIAGNOSIS — L0291 Cutaneous abscess, unspecified: Secondary | ICD-10-CM

## 2013-11-08 MED ORDER — CEPHALEXIN 500 MG PO CAPS: 500.0000 mg | ORAL_CAPSULE | Freq: Four times a day (QID) | ORAL | Status: DC

## 2013-11-08 MED ORDER — METFORMIN HCL 850 MG PO TABS: 850.0000 mg | ORAL_TABLET | Freq: Two times a day (BID) | ORAL | Status: DC

## 2013-11-08 MED ORDER — MUPIROCIN 2 % EX OINT: 1.0000 "application " | TOPICAL_OINTMENT | Freq: Two times a day (BID) | CUTANEOUS | Status: DC

## 2013-11-08 NOTE — Assessment & Plan Note (Signed)
Chronic intermittent condition for pt Wound Cx sent Start Keflex Mupirocin ointment in the future at onset of pimple (would give other ointment or oral but pt self pay nad finances a concern)

## 2013-11-08 NOTE — Assessment & Plan Note (Signed)
Chronic condition for pt.  CBG too low Decrease to metformin 500 BID Will Rx metformin 850 to use BID if needed as pt said in the past that this was the best dose for her.  Precautions given and all questions answered

## 2013-11-08 NOTE — Patient Instructions (Signed)
Please start the Keflex TWICE a day for 10 days Use the mupirocin ointment in the future for infected bumps that start Please cut your metofrmin in half and titrate as necessary. Keep your glucose above 100.

## 2013-11-08 NOTE — Progress Notes (Signed)
Carolyn Hooper is a 53 y.o. female who presents to [facility] today for L Breast cyst  L breast CYst: started as a small pustule on underside of L breast. Initially firm. Started draining today and applied pressure w/ further expression of puss. Painful. Has not applied any topical ointments or taken anything by mouth. Denies rash, fever, CP, SOB. Chronic h/o these tyipe of infections. Intermittent infections.  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 Would like to decrease dose.  Denies symptoms of hypoglycemia.    The following portions of the patient's history were reviewed and updated as appropriate: allergies, current medications, past medical history, family and social history, and problem list.  Patient is a smoker  Past Medical History  Diagnosis Date  . Hyperinsulinemia   . Anxiety   . Neuroma     audiotry neuroma  . Vertigo   . Abscess     hydroadenitis superativa    ROS as above otherwise neg.    Medications reviewed. Current Outpatient Prescriptions  Medication Sig Dispense Refill  . clonazePAM (KLONOPIN) 0.5 MG tablet Take 0.5 tablets (0.25 mg total) by mouth 2 (two) times daily as needed. For anxiety  60 tablet  0  . pravastatin (PRAVACHOL) 20 MG tablet Take 1 tablet (20 mg total) by mouth daily.  90 tablet  3  . cephALEXin (KEFLEX) 500 MG capsule Take 1 capsule (500 mg total) by mouth 4 (four) times daily.  40 capsule  0  . metFORMIN (GLUCOPHAGE) 850 MG tablet Take 1 tablet (850 mg total) by mouth 2 (two) times daily with a meal.  60 tablet  3  . mupirocin ointment (BACTROBAN) 2 % Apply 1 application topically 2 (two) times daily.  22 g  1   No current facility-administered medications for this visit.    Exam:  BP 139/83  Pulse 63  Temp(Src) 97.3 F (36.3 C) (Oral)  Ht 5\' 8"  (1.727 m)  Wt 296 lb (134.265 kg)  BMI 45.02 kg/m2 Gen: Well NAD HEENT: EOMI,  MMM SKin: L underside of  breast w/ open sore w/ surounding induration and erythema. Ttp.   No results found for this or any previous visit (from the past 72 hour(s)).  A/P (as seen in Problem list)  Abscess Chronic intermittent condition for pt Wound Cx sent Start Keflex Mupirocin ointment in the future at onset of pimple (would give other ointment or oral but pt self pay nad finances a concern)    Hyperinsulinemia Chronic condition for pt.  CBG too low Decrease to metformin 500 BID Will Rx metformin 850 to use BID if needed as pt said in the past that this was the best dose for her.  Precautions given and all questions answered

## 2013-11-12 LAB — ANAEROBIC AND AEROBIC CULTURE

## 2014-02-17 ENCOUNTER — Telehealth: Payer: Self-pay | Admitting: Family Medicine

## 2014-02-17 ENCOUNTER — Other Ambulatory Visit: Payer: Self-pay | Admitting: Family Medicine

## 2014-02-17 NOTE — Telephone Encounter (Signed)
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  If not dizzy or having any neurologic sx's then she is ok to fly.

## 2014-02-17 NOTE — Telephone Encounter (Signed)
Pt not seeing a specialist for this. See MRI report 2013. Pt does not have insurance is why not being followed for this. Please advise.

## 2014-02-17 NOTE — Telephone Encounter (Signed)
She should be ok to fly but may also want to ask her oncologist

## 2014-02-17 NOTE — Telephone Encounter (Signed)
Pt does have some dizziness so I advised she needs to be cleared by neuro before flying. Needs to make a followup appt soon for this. Pt refused because no insurance. Pt verbalized understanding.

## 2014-02-17 NOTE — Telephone Encounter (Signed)
Pt is not being followed by anyone for this since 2013.

## 2014-06-06 ENCOUNTER — Encounter: Payer: Self-pay | Admitting: Family Medicine

## 2014-06-06 ENCOUNTER — Ambulatory Visit: Payer: Self-pay | Admitting: Family Medicine

## 2014-06-06 VITALS — BP 141/81 | HR 79 | Temp 97.1°F | Ht 68.0 in | Wt 303.6 lb

## 2014-06-06 DIAGNOSIS — F411 Generalized anxiety disorder: Secondary | ICD-10-CM

## 2014-06-06 DIAGNOSIS — L0291 Cutaneous abscess, unspecified: Secondary | ICD-10-CM

## 2014-06-06 DIAGNOSIS — E161 Other hypoglycemia: Secondary | ICD-10-CM

## 2014-06-06 MED ORDER — CLONAZEPAM 0.5 MG PO TABS: 0.2500 mg | ORAL_TABLET | Freq: Two times a day (BID) | ORAL | Status: DC | PRN

## 2014-06-06 MED ORDER — CEPHALEXIN 500 MG PO CAPS: 500.0000 mg | ORAL_CAPSULE | Freq: Four times a day (QID) | ORAL | Status: DC

## 2014-06-06 MED ORDER — PRAVASTATIN SODIUM 20 MG PO TABS: 20.0000 mg | ORAL_TABLET | Freq: Every day | ORAL | Status: DC

## 2014-06-06 MED ORDER — METFORMIN HCL 850 MG PO TABS: 850.0000 mg | ORAL_TABLET | Freq: Two times a day (BID) | ORAL | Status: DC

## 2014-06-06 NOTE — Progress Notes (Signed)
Subjective:  Patient ID: Carolyn Hooper, female    DOB: December 05, 1960  Age: 54 y.o. MRN: [id number]  CC: Hyperlipidemia and Abscess   HPI ZEPPELIN BECKSTRAND presents for follow-up of anxiety today for Carolyn Hooper. She has a significant component of anxiety attacks when her anxiety gets out of control. This will lead to esophageal spasm and chest pain and then she will start crying and being unable to control his crying for an extended period of time. However this time she denies frank depression. She is not withdrawn she is staying active. She does have circumstances in her life where a significant other recently went through a bad depression and it isolated her from her friend. Additionally she has a mother who tends to be anxious and negative. On top of that she is about to attend her seventh wake this evening in the last 6 months she comes from a very large family. These all take their toll on her and she does not really have anybody she can she can share with in a positive manner this point. This has been the case ever since her significant other started having with his own depression.  Patient also states that she has problems with hyperinsulinemia. This will also make her rather weepy at times. In addition she can become nauseous and weak from that. Hirsch blood sugar will drop and she does not check her blood sugars and she is not officially a diabetic at this point. Her hemoglobin A1c has remained in normal range even though her insulin is high.   Using cephalexin intermittently for hydradenitis. Currently active at labia majora. Can occur elsewhere also.  History Sharyon has a past medical history of Hyperinsulinemia; Anxiety; Neuroma; Vertigo; and Abscess.   She has past surgical history that includes Shoulder surgery (Right, 1960s) and Abdominal hysterectomy (2006).   Her family history includes Arthritis in her father; COPD in her mother; Diabetes in her father; Heart disease in her father;  Kidney disease in her father; Liver disease in her father.She reports that she has been smoking.  She has never used smokeless tobacco. She reports that she drinks alcohol. She reports that she does not use illicit drugs.  Current Outpatient Prescriptions on File Prior to Visit  Medication Sig Dispense Refill  . mupirocin ointment (BACTROBAN) 2 % Apply 1 application topically 2 (two) times daily. 22 g 1   No current facility-administered medications on file prior to visit.    ROS Review of Systems  Constitutional: Negative for fever, chills, diaphoresis, appetite change, fatigue and unexpected weight change.  HENT: Negative for congestion, ear pain, hearing loss, postnasal drip, rhinorrhea, sneezing, sore throat and trouble swallowing.   Eyes: Negative for pain.  Respiratory: Negative for cough, chest tightness and shortness of breath.   Cardiovascular: Negative for chest pain and palpitations.  Gastrointestinal: Negative for nausea, vomiting, abdominal pain, diarrhea and constipation.  Genitourinary: Negative for dysuria, frequency and menstrual problem.  Musculoskeletal: Negative for joint swelling and arthralgias.  Skin: Negative for rash.  Neurological: Negative for dizziness, weakness, numbness and headaches.  Psychiatric/Behavioral: Negative for dysphoric mood and agitation.    Objective:  BP 141/81 mmHg  Pulse 79  Temp(Src) 97.1 F (36.2 C) (Oral)  Ht 5\' 8"  (1.727 m)  Wt 303 lb 9.6 oz (137.712 kg)  BMI 46.17 kg/m2  BP Readings from Last 3 Encounters:  06/06/14 141/81  11/08/13 139/83  08/19/13 136/83    Wt Readings from Last 3 Encounters:  06/06/14 303  lb 9.6 oz (137.712 kg)  11/08/13 296 lb (134.265 kg)  08/19/13 296 lb (134.265 kg)     Physical Exam  Constitutional: She is oriented to person, place, and time. She appears well-developed and well-nourished. No distress.  HENT:  Head: Normocephalic and atraumatic.  Right Ear: External ear normal.  Left Ear:  External ear normal.  Nose: Nose normal.  Mouth/Throat: Oropharynx is clear and moist.  Eyes: Conjunctivae and EOM are normal. Pupils are equal, round, and reactive to light.  Neck: Normal range of motion. Neck supple. No thyromegaly present.  Cardiovascular: Normal rate, regular rhythm and normal heart sounds.   No murmur heard. Pulmonary/Chest: Effort normal and breath sounds normal. No respiratory distress. She has no wheezes. She has no rales.  Abdominal: Soft. Bowel sounds are normal. She exhibits no distension. There is no tenderness.  Lymphadenopathy:    She has no cervical adenopathy.  Neurological: She is alert and oriented to person, place, and time. She has normal reflexes.  Skin: Skin is warm and dry.  Psychiatric: She has a normal mood and affect. Her behavior is normal. Judgment and thought content normal.    Lab Results  Component Value Date   HGBA1C 5.7% 08/19/2013   HGBA1C 5.8* 04/08/2011    Lab Results  Component Value Date   WBC 15.5* 04/07/2011   HGB 16.3* 04/07/2011   HCT 48.1* 04/07/2011   PLT 258 04/07/2011   GLUCOSE 129* 08/19/2013   CHOL 203* 08/19/2013   TRIG 137 08/19/2013   HDL 39* 08/19/2013   LDLCALC 137* 08/19/2013   ALT 24 04/08/2011   AST 21 04/08/2011   NA 144 08/19/2013   K 5.4* 08/19/2013   CL 101 08/19/2013   CREATININE 0.91 08/19/2013   BUN 9 08/19/2013   CO2 28 08/19/2013   INR 0.92 04/08/2011   HGBA1C 5.7% 08/19/2013    Ct Head Wo Contrast  04/08/2011   *RADIOLOGY REPORT*  Clinical Data: Dizziness and nausea.  CT HEAD WITHOUT CONTRAST  Technique:  Contiguous axial images were obtained from the base of the skull through the vertex without contrast.  Comparison: None.  Findings: There is no evidence of acute infarction, mass lesion, or intra- or extra-axial hemorrhage on CT.  The posterior fossa, including the cerebellum, brainstem and fourth ventricle, is within normal limits.  The third and lateral ventricles, and basal ganglia are  unremarkable in appearance.  The cerebral hemispheres are symmetric in appearance, with normal gray- white differentiation.  No mass effect or midline shift is seen.  There is no evidence of fracture; visualized osseous structures are unremarkable in appearance.  The orbits are within normal limits. The paranasal sinuses and mastoid air cells are well-aerated.  No significant soft tissue abnormalities are seen.  Cerumen is noted filling the left external auditory canal.  IMPRESSION:  1.  No acute intracranial pathology seen on CT. 2.  Cerumen noted filling the left external auditory canal.  Original Report Authenticated By: Santa Lighter, M.D.  Mr Angiogram Miguel Dibble Wo Contrast  04/08/2011   *RADIOLOGY REPORT*  Clinical Data:  54 year old female with vertigo, TIA.  Comparison: Head CT without contrast 04/08/2011.  MRI HEAD WITHOUT AND WITH CONTRAST  Technique: Multiplanar, multiecho pulse sequences of the brain and surrounding structures were obtained according to standard protocol without and with intravenous contrast.  Contrast:  20 ml MultiHance.  Findings:  No restricted diffusion to suggest acute infarction.  No midline shift, mass effect,ventriculomegaly, extra-axial collection or acute intracranial hemorrhage.  Cervicomedullary junction and pituitary are within normal limits.  Major intracranial vascular flow voids are preserved.  Pearline Cables and white matter signal is within normal limits throughout the brain.  Normal cerebral volume.  Negative visualized cervical spine.  Hyperostosis of the calvarium. Otherwise normal bone marrow signal.  Rounded 6 x 6 x 7 mm enhancing mass in the right internal auditory canal appears associated with the intracanilicular right seventh and eighth nerves segments.  No extension into the right cerebellopontine angle or mass effect on the brainstem.  T2 signal is preserved in the right cochlea and vestibular apparatus.  The left side visualized internal auditory structures are  grossly normal.  No other abnormal enhancement.  Visualized orbit soft tissues are within normal limits.  Minimal fluid signal in the left mastoid air cells.  Minimal mucosal thickening in the right maxillary sinus.  Negative scalp soft tissues.  IMPRESSION: 1.  Right IAC vestibular schwannoma, 6 x 6 x 7 mm. 2.  No other acute intracranial abnormality and otherwise negative MRI appearance of the brain. 3.  See MRA findings below. 4.  Minor mastoid and paranasal sinus inflammatory change.  MRA HEAD WITHOUT CONTRAST  Technique: Angiographic images of the Circle of Willis were obtained using MRA technique without  intravenous contrast.  Findings:  Antegrade flow in the posterior circulation.  Codominant distal vertebral arteries.  Normal left PICA.  Dominant, and duplicated, right AICA. A right AICA loop appears partially extend into the right IAC.  Similarly, there is a left AICA loop in the left IAC.  Normal vertebrobasilar junction.  Mild basilar artery irregularity without stenosis.  Superior cerebellar arteries and PCA origins are normal.  Posterior communicating arteries are diminutive or absent.  Bilateral PCA branches are within normal limits.  Antegrade flow in both ICA siphons.  Normal ophthalmic artery origins.  Normal carotid termini, MCA and ACA origins.  Tortuous but otherwise normal anterior communicating artery.  Visualized ACA branches are within normal limits.  Visualized bilateral MCA branches are within normal limits.  IMPRESSION: 1.  Note that a right AICA loop partially extends into the right IAC. 2.  Negative intracranial MRA.  Original Report Authenticated By: Randall An, M.D.  Mr Jeri Cos Wo Contrast  04/08/2011   *RADIOLOGY REPORT*  Clinical Data:  54 year old female with vertigo, TIA.  Comparison: Head CT without contrast 04/08/2011.  MRI HEAD WITHOUT AND WITH CONTRAST  Technique: Multiplanar, multiecho pulse sequences of the brain and surrounding structures were obtained according to  standard protocol without and with intravenous contrast.  Contrast:  20 ml MultiHance.  Findings:  No restricted diffusion to suggest acute infarction.  No midline shift, mass effect,ventriculomegaly, extra-axial collection or acute intracranial hemorrhage.  Cervicomedullary junction and pituitary are within normal limits.  Major intracranial vascular flow voids are preserved.  Pearline Cables and white matter signal is within normal limits throughout the brain.  Normal cerebral volume.  Negative visualized cervical spine.  Hyperostosis of the calvarium. Otherwise normal bone marrow signal.  Rounded 6 x 6 x 7 mm enhancing mass in the right internal auditory canal appears associated with the intracanilicular right seventh and eighth nerves segments.  No extension into the right cerebellopontine angle or mass effect on the brainstem.  T2 signal is preserved in the right cochlea and vestibular apparatus.  The left side visualized internal auditory structures are grossly normal.  No other abnormal enhancement.  Visualized orbit soft tissues are within normal limits.  Minimal fluid signal in the left mastoid air cells.  Minimal mucosal thickening in the right maxillary sinus.  Negative scalp soft tissues.  IMPRESSION: 1.  Right IAC vestibular schwannoma, 6 x 6 x 7 mm. 2.  No other acute intracranial abnormality and otherwise negative MRI appearance of the brain. 3.  See MRA findings below. 4.  Minor mastoid and paranasal sinus inflammatory change.  MRA HEAD WITHOUT CONTRAST  Technique: Angiographic images of the Circle of Willis were obtained using MRA technique without  intravenous contrast.  Findings:  Antegrade flow in the posterior circulation.  Codominant distal vertebral arteries.  Normal left PICA.  Dominant, and duplicated, right AICA. A right AICA loop appears partially extend into the right IAC.  Similarly, there is a left AICA loop in the left IAC.  Normal vertebrobasilar junction.  Mild basilar artery irregularity  without stenosis.  Superior cerebellar arteries and PCA origins are normal.  Posterior communicating arteries are diminutive or absent.  Bilateral PCA branches are within normal limits.  Antegrade flow in both ICA siphons.  Normal ophthalmic artery origins.  Normal carotid termini, MCA and ACA origins.  Tortuous but otherwise normal anterior communicating artery.  Visualized ACA branches are within normal limits.  Visualized bilateral MCA branches are within normal limits.  IMPRESSION: 1.  Note that a right AICA loop partially extends into the right IAC. 2.  Negative intracranial MRA.  Original Report Authenticated By: Randall An, M.D.   Assessment & Plan:   Beatrix was seen today for hyperlipidemia and abscess.  Diagnoses and all orders for this visit:  Generalized anxiety disorder Orders: -     clonazePAM (KLONOPIN) 0.5 MG tablet; Take 0.5 tablets (0.25 mg total) by mouth 2 (two) times daily as needed. For anxiety  Abscess Orders: -     cephALEXin (KEFLEX) 500 MG capsule; Take 1 capsule (500 mg total) by mouth 4 (four) times daily.  Hyperinsulinemia Orders: -     metFORMIN (GLUCOPHAGE) 850 MG tablet; Take 1 tablet (850 mg total) by mouth 2 (two) times daily with a meal.  Other orders -     pravastatin (PRAVACHOL) 20 MG tablet; Take 1 tablet (20 mg total) by mouth daily.   I am having Ms. Glendenning maintain her mupirocin ointment, clonazePAM, pravastatin, metFORMIN, and cephALEXin.  Meds ordered this encounter  Medications  . clonazePAM (KLONOPIN) 0.5 MG tablet    Sig: Take 0.5 tablets (0.25 mg total) by mouth 2 (two) times daily as needed. For anxiety    Dispense:  60 tablet    Refill:  1  . pravastatin (PRAVACHOL) 20 MG tablet    Sig: Take 1 tablet (20 mg total) by mouth daily.    Dispense:  90 tablet    Refill:  3  . metFORMIN (GLUCOPHAGE) 850 MG tablet    Sig: Take 1 tablet (850 mg total) by mouth 2 (two) times daily with a meal.    Dispense:  180 tablet    Refill:  3  .  cephALEXin (KEFLEX) 500 MG capsule    Sig: Take 1 capsule (500 mg total) by mouth 4 (four) times daily.    Dispense:  40 capsule    Refill:  1     Follow-up: Return in about 3 months (around 09/06/2014).  Claretta Fraise, M.D.

## 2014-10-01 ENCOUNTER — Other Ambulatory Visit: Payer: Self-pay | Admitting: Family

## 2015-04-28 ENCOUNTER — Telehealth: Payer: Self-pay | Admitting: Family

## 2015-05-22 ENCOUNTER — Ambulatory Visit: Payer: Self-pay | Admitting: Cardiology

## 2016-01-16 ENCOUNTER — Ambulatory Visit: Payer: Self-pay | Admitting: Family

## 2016-04-11 ENCOUNTER — Encounter: Payer: Self-pay | Admitting: Family

## 2016-04-11 ENCOUNTER — Ambulatory Visit (INDEPENDENT_AMBULATORY_CARE_PROVIDER_SITE_OTHER): Payer: Self-pay | Admitting: Family

## 2016-04-11 VITALS — BP 137/86 | HR 67 | Temp 97.7°F | Ht 68.0 in | Wt 294.0 lb

## 2016-04-11 DIAGNOSIS — E785 Hyperlipidemia, unspecified: Secondary | ICD-10-CM

## 2016-04-11 DIAGNOSIS — E161 Other hypoglycemia: Secondary | ICD-10-CM

## 2016-04-11 DIAGNOSIS — F411 Generalized anxiety disorder: Secondary | ICD-10-CM

## 2016-04-11 DIAGNOSIS — Z6841 Body Mass Index (BMI) 40.0 and over, adult: Secondary | ICD-10-CM

## 2016-04-11 DIAGNOSIS — L732 Hidradenitis suppurativa: Secondary | ICD-10-CM | POA: Insufficient documentation

## 2016-04-11 DIAGNOSIS — F17200 Nicotine dependence, unspecified, uncomplicated: Secondary | ICD-10-CM

## 2016-04-11 DIAGNOSIS — Z87891 Personal history of nicotine dependence: Secondary | ICD-10-CM | POA: Insufficient documentation

## 2016-04-11 DIAGNOSIS — E1169 Type 2 diabetes mellitus with other specified complication: Secondary | ICD-10-CM | POA: Insufficient documentation

## 2016-04-11 LAB — BAYER DCA HB A1C WAIVED: HB A1C: 6.6 % (ref <7.0)

## 2016-04-11 MED ORDER — CEPHALEXIN 500 MG PO CAPS: 500.0000 mg | ORAL_CAPSULE | Freq: Four times a day (QID) | ORAL | 1 refills | Status: DC

## 2016-04-11 MED ORDER — METFORMIN HCL 1000 MG PO TABS: 1000.0000 mg | ORAL_TABLET | Freq: Two times a day (BID) | ORAL | 3 refills | Status: DC

## 2016-04-11 MED ORDER — METFORMIN HCL 850 MG PO TABS: 850.0000 mg | ORAL_TABLET | Freq: Two times a day (BID) | ORAL | 3 refills | Status: DC

## 2016-04-11 MED ORDER — CLONAZEPAM 0.5 MG PO TABS: 0.2500 mg | ORAL_TABLET | Freq: Two times a day (BID) | ORAL | 5 refills | Status: DC | PRN

## 2016-04-11 NOTE — Progress Notes (Signed)
Subjective:    Patient ID: Carolyn Hooper, female    DOB: March 14, 1961, 56 y.o.   MRN: [id number]  Pt presents to the office today for chronic follow up.  Diabetes  She presents for her follow-up diabetic visit. She has type 2 diabetes mellitus. Her disease course has been stable. Hypoglycemia symptoms include nervousness/anxiousness. Pertinent negatives for hypoglycemia include no confusion or dizziness. Pertinent negatives for diabetes include no blurred vision, no foot paresthesias, no foot ulcerations and no visual change. There are no hypoglycemic complications. Symptoms are stable. There are no diabetic complications. Risk factors for coronary artery disease include diabetes mellitus, dyslipidemia, sedentary lifestyle, post-menopausal and family history. Current diabetic treatment includes oral agent (monotherapy). She is compliant with treatment all of the time. Her weight is stable. She is following a generally healthy diet. (Does not check blood glucose at home ) An ACE inhibitor/angiotensin II receptor blocker is not being taken. She does not see a podiatrist.Eye exam is not current (Pt educated on getting yearly exam- Last 2002).  Anxiety  Presents for follow-up visit. Onset was 1 to 6 months ago. The problem has been resolved. Symptoms include excessive worry and nervous/anxious behavior. Patient reports no confusion, dizziness, insomnia, palpitations or restlessness. Symptoms occur occasionally. The severity of symptoms is mild. The quality of sleep is good.   Past treatments include benzodiazephines. The treatment provided moderate relief. Compliance with prior treatments has been good.  Hyperlipidemia  This is a chronic problem. The current episode started more than 1 year ago. The problem is uncontrolled. Recent lipid tests were reviewed and are high. Exacerbating diseases include obesity. She is currently on no antihyperlipidemic treatment. The current treatment provides no improvement  of lipids. Risk factors for coronary artery disease include diabetes mellitus, dyslipidemia, obesity and post-menopausal.  Hidradenitis Suppurativa Pt states this is stable and takes keflex as needed for breakouts.    Review of Systems  HENT: Negative.   Eyes: Negative for blurred vision.  Respiratory: Negative.   Cardiovascular: Negative.  Negative for palpitations.  Gastrointestinal: Negative.   Genitourinary: Negative.   Musculoskeletal: Negative.   Neurological: Negative for dizziness.  Psychiatric/Behavioral: Negative for confusion. The patient is nervous/anxious. The patient does not have insomnia.   All other systems reviewed and are negative.      Objective:   Physical Exam  Constitutional: She is oriented to person, place, and time. She appears well-developed and well-nourished. No distress.  Morbid obese   HENT:  Head: Normocephalic and atraumatic.  Right Ear: External ear normal.  Left Ear: External ear normal.  Nose: Nose normal.  Mouth/Throat: Oropharynx is clear and moist.  Left cerumen impaction   Eyes: Pupils are equal, round, and reactive to light.  Neck: Normal range of motion. Neck supple. No thyromegaly present.  Cardiovascular: Normal rate, regular rhythm, normal heart sounds and intact distal pulses.   No murmur heard. Pulmonary/Chest: Effort normal. No respiratory distress. She has no wheezes.  Breath sounds diminished   Abdominal: Soft. Bowel sounds are normal. She exhibits no distension. There is no tenderness.  Musculoskeletal: Normal range of motion. She exhibits no edema or tenderness.  Neurological: She is alert and oriented to person, place, and time.  Skin: Skin is warm and dry.  Psychiatric: She has a normal mood and affect. Her behavior is normal. Judgment and thought content normal.  Vitals reviewed.     BP 137/86   Pulse 67   Temp 97.7 F (36.5 C) (Oral)   Ht 5'  8" (1.727 m)   Wt 294 lb (133.4 kg)   BMI 44.70 kg/m        Assessment & Plan:  1. Generalized anxiety disorder - CMP14+EGFR - clonazePAM (KLONOPIN) 0.5 MG tablet; Take 0.5 tablets (0.25 mg total) by mouth 2 (two) times daily as needed. For anxiety  Dispense: 30 tablet; Refill: 5  2. Hyperinsulinemia - CMP14+EGFR - Microalbumin / creatinine urine ratio - Bayer DCA Hb A1c Waived - metFORMIN (GLUCOPHAGE) 1000 MG tablet; Take 1 tablet (1,000 mg total) by mouth 2 (two) times daily with a meal.  Dispense: 180 tablet; Refill: 3 - metFORMIN (GLUCOPHAGE) 850 MG tablet; Take 1 tablet (850 mg total) by mouth 2 (two) times daily with a meal.  Dispense: 180 tablet; Refill: 3  3. Hyperlipidemia, unspecified hyperlipidemia type - CMP14+EGFR - Lipid panel  4. Morbid obesity with BMI of 40.0-44.9, adult (HCC) - CMP14+EGFR  5. Current smoker - CMP14+EGFR  6. Hidradenitis suppurativa - CMP14+EGFR - cephALEXin (KEFLEX) 500 MG capsule; Take 1 capsule (500 mg total) by mouth 4 (four) times daily.  Dispense: 28 capsule; Refill: 1   Continue all meds Labs pending Health Maintenance reviewed Diet and exercise encouraged RTO 6 months  Evelina Dun, FNP

## 2016-04-11 NOTE — Patient Instructions (Signed)

## 2016-04-12 ENCOUNTER — Other Ambulatory Visit: Payer: Self-pay | Admitting: Family

## 2016-04-12 LAB — LIPID PANEL
Chol/HDL Ratio: 6 ratio units — ABNORMAL HIGH (ref 0.0–4.4)
Cholesterol, Total: 210 mg/dL — ABNORMAL HIGH (ref 100–199)
HDL: 35 mg/dL — AB (ref >39)
LDL CALC: 140 mg/dL — AB (ref 0–99)
TRIGLYCERIDES: 177 mg/dL — AB (ref 0–149)
VLDL Cholesterol Cal: 35 mg/dL (ref 5–40)

## 2016-04-12 LAB — CMP14+EGFR
ALBUMIN: 3.9 g/dL (ref 3.5–5.5)
ALT: 40 IU/L — ABNORMAL HIGH (ref 0–32)
AST: 34 IU/L (ref 0–40)
Albumin/Globulin Ratio: 1.6 (ref 1.2–2.2)
Alkaline Phosphatase: 81 IU/L (ref 39–117)
BUN/Creatinine Ratio: 15 (ref 9–23)
BUN: 12 mg/dL (ref 6–24)
Bilirubin Total: 0.4 mg/dL (ref 0.0–1.2)
CO2: 25 mmol/L (ref 18–29)
CREATININE: 0.82 mg/dL (ref 0.57–1.00)
Calcium: 9.3 mg/dL (ref 8.7–10.2)
Chloride: 97 mmol/L (ref 96–106)
GFR calc non Af Amer: 80 mL/min/{1.73_m2} (ref >59)
GFR, EST AFRICAN AMERICAN: 93 mL/min/{1.73_m2} (ref >59)
GLOBULIN, TOTAL: 2.5 g/dL (ref 1.5–4.5)
Glucose: 166 mg/dL — ABNORMAL HIGH (ref 65–99)
Potassium: 5 mmol/L (ref 3.5–5.2)
Sodium: 139 mmol/L (ref 134–144)
TOTAL PROTEIN: 6.4 g/dL (ref 6.0–8.5)

## 2016-04-12 LAB — MICROALBUMIN / CREATININE URINE RATIO
CREATININE, UR: 25.7 mg/dL
Microalb/Creat Ratio: <11.7 mg/g creat (ref 0.0–30.0)

## 2016-04-12 MED ORDER — PRAVASTATIN SODIUM 20 MG PO TABS: 20.0000 mg | ORAL_TABLET | Freq: Every day | ORAL | 3 refills | Status: DC

## 2016-10-11 ENCOUNTER — Ambulatory Visit: Payer: Self-pay | Admitting: Family

## 2017-04-14 ENCOUNTER — Encounter: Payer: Self-pay | Admitting: Family

## 2017-04-14 ENCOUNTER — Ambulatory Visit (INDEPENDENT_AMBULATORY_CARE_PROVIDER_SITE_OTHER): Payer: BLUE CROSS/BLUE SHIELD | Admitting: Family

## 2017-04-14 VITALS — BP 142/87 | HR 72 | Temp 97.5°F | Ht 68.0 in | Wt 288.0 lb

## 2017-04-14 DIAGNOSIS — E1169 Type 2 diabetes mellitus with other specified complication: Secondary | ICD-10-CM | POA: Diagnosis not present

## 2017-04-14 DIAGNOSIS — Z1159 Encounter for screening for other viral diseases: Secondary | ICD-10-CM

## 2017-04-14 DIAGNOSIS — F17200 Nicotine dependence, unspecified, uncomplicated: Secondary | ICD-10-CM

## 2017-04-14 DIAGNOSIS — E785 Hyperlipidemia, unspecified: Secondary | ICD-10-CM

## 2017-04-14 DIAGNOSIS — E119 Type 2 diabetes mellitus without complications: Secondary | ICD-10-CM

## 2017-04-14 DIAGNOSIS — F411 Generalized anxiety disorder: Secondary | ICD-10-CM

## 2017-04-14 DIAGNOSIS — Z6841 Body Mass Index (BMI) 40.0 and over, adult: Secondary | ICD-10-CM | POA: Diagnosis not present

## 2017-04-14 DIAGNOSIS — Z0000 Encounter for general adult medical examination without abnormal findings: Secondary | ICD-10-CM | POA: Diagnosis not present

## 2017-04-14 DIAGNOSIS — Z1211 Encounter for screening for malignant neoplasm of colon: Secondary | ICD-10-CM

## 2017-04-14 LAB — BAYER DCA HB A1C WAIVED: HB A1C (BAYER DCA - WAIVED): 5.2 % (ref <7.0)

## 2017-04-14 NOTE — Patient Instructions (Signed)

## 2017-04-14 NOTE — Progress Notes (Signed)
Subjective:    Patient ID: Carolyn Hooper, female    DOB: 11-27-1960, 57 y.o.   MRN: [id number]  Pt presents to the office today for CPE without pap.  Hyperlipidemia  This is a chronic problem. The current episode started more than 1 year ago. The problem is uncontrolled. Recent lipid tests were reviewed and are high. Exacerbating diseases include obesity. She is currently on no antihyperlipidemic treatment. The current treatment provides no improvement of lipids. Risk factors for coronary artery disease include dyslipidemia, obesity, post-menopausal and a sedentary lifestyle.  Anxiety  Presents for follow-up visit. Symptoms include excessive worry and restlessness. Symptoms occur occasionally.    Diabetes  She presents for her follow-up diabetic visit. She has type 2 diabetes mellitus. Her disease course has been improving. There are no hypoglycemic associated symptoms. Pertinent negatives for diabetes include no foot paresthesias, no foot ulcerations and no visual change. Symptoms are stable. Pertinent negatives for diabetic complications include no CVA, heart disease, nephropathy or peripheral neuropathy. Risk factors for coronary artery disease include dyslipidemia, diabetes mellitus, obesity, family history and hypertension. (Does not check BS at home ) Eye exam is not current.      Review of Systems  All other systems reviewed and are negative.      Objective:   Physical Exam  Constitutional: She is oriented to person, place, and time. She appears well-developed and well-nourished. No distress.  Morbid obese   HENT:  Head: Normocephalic and atraumatic.  Right Ear: External ear normal.  Mouth/Throat: Oropharynx is clear and moist.  Eyes: Pupils are equal, round, and reactive to light.  Neck: Normal range of motion. Neck supple. No thyromegaly present.  Cardiovascular: Normal rate, regular rhythm, normal heart sounds and intact distal pulses.  No murmur  heard. Pulmonary/Chest: Effort normal and breath sounds normal. No respiratory distress. She has no wheezes.  Abdominal: Soft. Bowel sounds are normal. She exhibits no distension. There is no tenderness.  Musculoskeletal: Normal range of motion. She exhibits no edema or tenderness.  Neurological: She is alert and oriented to person, place, and time.  Skin: Skin is warm and dry.  Psychiatric: She has a normal mood and affect. Her behavior is normal. Judgment and thought content normal.  Vitals reviewed.    BP (!) 142/87   Pulse 72   Temp (!) 97.5 F (36.4 C) (Oral)   Ht _0  (1.727 m)   Wt 288 lb (130.6 kg)   BMI 43.79 kg/m      Assessment & Plan:  1. Annual physical exam - Bayer DCA Hb A1c Waived - CMP14+EGFR - Anemia Profile B - Lipid panel - TSH - VITAMIN D 25 Hydroxy (Vit-D Deficiency, Fractures) - HIV antibody - Hepatitis C antibody - Ambulatory referral to Gastroenterology - Ambulatory referral to Ophthalmology - Fecal occult blood, imunochemical; Future  2. Hyperlipidemia associated with type 2 diabetes mellitus (HCC) - CMP14+EGFR - Lipid panel  3. Generalized anxiety disorder - CMP14+EGFR  4. Morbid obesity with BMI of 40.0-44.9, adult (HCC) - CMP14+EGFR  5. Current smoker - CMP14+EGFR  6. Encounter for hepatitis C screening test for low risk patient - CMP14+EGFR - Hepatitis C antibody  7. Colon cancer screening  - CMP14+EGFR - Ambulatory referral to Gastroenterology - Fecal occult blood, imunochemical; Future  8. Diabetes mellitus without complication ([place]) - Bayer DCA Hb A1c Waived - CMP14+EGFR - Ambulatory referral to Ophthalmology - Microalbumin / creatinine urine ratio - Insulin, random   Continue all meds Labs pending Health  Maintenance reviewed Diet and exercise encouraged RTO 6  Months   Evelina Dun, FNP

## 2017-04-15 LAB — LIPID PANEL
Chol/HDL Ratio: 5.8 ratio — ABNORMAL HIGH (ref 0.0–4.4)
Cholesterol, Total: 210 mg/dL — ABNORMAL HIGH (ref 100–199)
HDL: 36 mg/dL — ABNORMAL LOW (ref >39)
LDL CALC: 143 mg/dL — AB (ref 0–99)
Triglycerides: 153 mg/dL — ABNORMAL HIGH (ref 0–149)
VLDL Cholesterol Cal: 31 mg/dL (ref 5–40)

## 2017-04-15 LAB — ANEMIA PROFILE B
BASOS ABS: 0.1 10*3/uL (ref 0.0–0.2)
Basos: 1 %
EOS (ABSOLUTE): 0.4 10*3/uL (ref 0.0–0.4)
Eos: 3 %
FOLATE: 9.6 ng/mL (ref >3.0)
Ferritin: 213 ng/mL — ABNORMAL HIGH (ref 15–150)
Hematocrit: 47.8 % — ABNORMAL HIGH (ref 34.0–46.6)
Hemoglobin: 16.4 g/dL — ABNORMAL HIGH (ref 11.1–15.9)
IRON: 44 ug/dL (ref 27–159)
Immature Grans (Abs): 0 10*3/uL (ref 0.0–0.1)
Immature Granulocytes: 0 %
Iron Saturation: 13 % — ABNORMAL LOW (ref 15–55)
LYMPHS ABS: 3.2 10*3/uL — AB (ref 0.7–3.1)
Lymphs: 26 %
MCH: 29 pg (ref 26.6–33.0)
MCHC: 34.3 g/dL (ref 31.5–35.7)
MCV: 85 fL (ref 79–97)
MONOS ABS: 0.7 10*3/uL (ref 0.1–0.9)
Monocytes: 6 %
NEUTROS ABS: 8 10*3/uL — AB (ref 1.4–7.0)
NEUTROS PCT: 64 %
PLATELETS: 292 10*3/uL (ref 150–379)
RBC: 5.66 x10E6/uL — AB (ref 3.77–5.28)
RDW: 14.7 % (ref 12.3–15.4)
Retic Ct Pct: 2 % (ref 0.6–2.6)
Total Iron Binding Capacity: 340 ug/dL (ref 250–450)
UIBC: 296 ug/dL (ref 131–425)
VITAMIN B 12: 700 pg/mL (ref 232–1245)
WBC: 12.4 10*3/uL — AB (ref 3.4–10.8)

## 2017-04-15 LAB — CMP14+EGFR
ALBUMIN: 4.1 g/dL (ref 3.5–5.5)
ALK PHOS: 91 IU/L (ref 39–117)
ALT: 40 IU/L — ABNORMAL HIGH (ref 0–32)
AST: 33 IU/L (ref 0–40)
Albumin/Globulin Ratio: 1.5 (ref 1.2–2.2)
BUN / CREAT RATIO: 12 (ref 9–23)
BUN: 10 mg/dL (ref 6–24)
Bilirubin Total: 0.3 mg/dL (ref 0.0–1.2)
CO2: 25 mmol/L (ref 20–29)
CREATININE: 0.81 mg/dL (ref 0.57–1.00)
Calcium: 9.9 mg/dL (ref 8.7–10.2)
Chloride: 99 mmol/L (ref 96–106)
GFR calc non Af Amer: 81 mL/min/{1.73_m2} (ref >59)
GFR, EST AFRICAN AMERICAN: 93 mL/min/{1.73_m2} (ref >59)
GLOBULIN, TOTAL: 2.8 g/dL (ref 1.5–4.5)
GLUCOSE: 158 mg/dL — AB (ref 65–99)
Potassium: 5.1 mmol/L (ref 3.5–5.2)
SODIUM: 141 mmol/L (ref 134–144)
TOTAL PROTEIN: 6.9 g/dL (ref 6.0–8.5)

## 2017-04-15 LAB — MICROALBUMIN / CREATININE URINE RATIO
Creatinine, Urine: 80.6 mg/dL
MICROALB/CREAT RATIO: 40.2 mg/g{creat} — AB (ref 0.0–30.0)
Microalbumin, Urine: 32.4 ug/mL

## 2017-04-15 LAB — INSULIN, RANDOM: INSULIN: 54.1 u[IU]/mL — AB (ref 2.6–24.9)

## 2017-04-15 LAB — TSH: TSH: 2.48 u[IU]/mL (ref 0.450–4.500)

## 2017-04-15 LAB — HIV ANTIBODY (ROUTINE TESTING W REFLEX): HIV Screen 4th Generation wRfx: NONREACTIVE

## 2017-04-15 LAB — VITAMIN D 25 HYDROXY (VIT D DEFICIENCY, FRACTURES): VIT D 25 HYDROXY: 35.2 ng/mL (ref 30.0–100.0)

## 2017-04-15 LAB — HEPATITIS C ANTIBODY: Hep C Virus Ab: <0.1 {s_co_ratio} (ref 0.0–0.9)

## 2017-04-17 ENCOUNTER — Other Ambulatory Visit: Payer: Self-pay | Admitting: Family

## 2017-04-17 MED ORDER — ATORVASTATIN CALCIUM 20 MG PO TABS: 20.0000 mg | ORAL_TABLET | Freq: Every day | ORAL | 1 refills | Status: DC

## 2017-04-21 ENCOUNTER — Telehealth: Payer: Self-pay | Admitting: Family

## 2017-04-21 ENCOUNTER — Encounter: Payer: Self-pay | Admitting: *Deleted

## 2017-04-21 NOTE — Telephone Encounter (Signed)
Aware of lab results  

## 2017-04-22 ENCOUNTER — Encounter: Payer: Self-pay | Admitting: Gastroenterology

## 2017-05-21 ENCOUNTER — Ambulatory Visit: Payer: Self-pay

## 2017-05-22 ENCOUNTER — Other Ambulatory Visit: Payer: Self-pay | Admitting: Family

## 2017-05-22 DIAGNOSIS — L732 Hidradenitis suppurativa: Secondary | ICD-10-CM

## 2017-05-22 DIAGNOSIS — F411 Generalized anxiety disorder: Secondary | ICD-10-CM

## 2017-05-22 DIAGNOSIS — E161 Other hypoglycemia: Secondary | ICD-10-CM

## 2017-05-23 ENCOUNTER — Telehealth: Payer: Self-pay | Admitting: Family

## 2017-05-23 DIAGNOSIS — E161 Other hypoglycemia: Secondary | ICD-10-CM

## 2017-05-23 MED ORDER — METFORMIN HCL 1000 MG PO TABS: 1000.0000 mg | ORAL_TABLET | Freq: Two times a day (BID) | ORAL | 3 refills | Status: DC

## 2017-05-23 MED ORDER — METFORMIN HCL 500 MG PO TABS: 500.0000 mg | ORAL_TABLET | Freq: Two times a day (BID) | ORAL | 3 refills | Status: DC

## 2017-05-23 MED ORDER — CLONAZEPAM 0.5 MG PO TABS: 0.2500 mg | ORAL_TABLET | Freq: Two times a day (BID) | ORAL | 5 refills | Status: DC | PRN

## 2017-05-23 MED ORDER — CEPHALEXIN 500 MG PO CAPS: 500.0000 mg | ORAL_CAPSULE | Freq: Four times a day (QID) | ORAL | 1 refills | Status: DC

## 2017-05-23 NOTE — Telephone Encounter (Signed)
Patient aware.

## 2017-05-23 NOTE — Telephone Encounter (Signed)
A1C was at goal. Will decrease to 1000 mg BID. Void first phone message.

## 2017-05-23 NOTE — Telephone Encounter (Signed)
Prescription sent to pharmacy. Metformin changed to 1500 mg BID from 1850 mg BID. Pt's A1C was at goal last time and that dose exceeds recommended maximum  dosing.

## 2017-05-23 NOTE — Telephone Encounter (Signed)
Pharm aware.  

## 2017-06-27 ENCOUNTER — Encounter: Payer: Self-pay | Admitting: Family Medicine

## 2017-06-27 ENCOUNTER — Ambulatory Visit (INDEPENDENT_AMBULATORY_CARE_PROVIDER_SITE_OTHER): Payer: BLUE CROSS/BLUE SHIELD | Admitting: Family Medicine

## 2017-06-27 DIAGNOSIS — M5432 Sciatica, left side: Secondary | ICD-10-CM | POA: Diagnosis not present

## 2017-06-27 DIAGNOSIS — S134XXA Sprain of ligaments of cervical spine, initial encounter: Secondary | ICD-10-CM

## 2017-06-27 DIAGNOSIS — R0789 Other chest pain: Secondary | ICD-10-CM

## 2017-06-27 DIAGNOSIS — M25561 Pain in right knee: Secondary | ICD-10-CM | POA: Diagnosis not present

## 2017-06-27 DIAGNOSIS — M25562 Pain in left knee: Secondary | ICD-10-CM

## 2017-06-27 MED ORDER — DICLOFENAC SODIUM 75 MG PO TBEC: 75.0000 mg | DELAYED_RELEASE_TABLET | Freq: Two times a day (BID) | ORAL | 0 refills | Status: DC

## 2017-06-27 MED ORDER — BACLOFEN 10 MG PO TABS: 10.0000 mg | ORAL_TABLET | Freq: Three times a day (TID) | ORAL | 0 refills | Status: DC
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
## 2017-08-05 ENCOUNTER — Telehealth: Payer: Self-pay | Admitting: Family

## 2018-01-29 ENCOUNTER — Other Ambulatory Visit: Payer: Self-pay | Admitting: Family

## 2018-01-29 DIAGNOSIS — L732 Hidradenitis suppurativa: Secondary | ICD-10-CM

## 2018-05-28 ENCOUNTER — Other Ambulatory Visit: Payer: Self-pay

## 2018-05-28 ENCOUNTER — Ambulatory Visit (INDEPENDENT_AMBULATORY_CARE_PROVIDER_SITE_OTHER): Payer: BLUE CROSS/BLUE SHIELD | Admitting: Family

## 2018-05-28 ENCOUNTER — Encounter: Payer: Self-pay | Admitting: Family

## 2018-05-28 VITALS — BP 127/74 | HR 77 | Ht 67.0 in | Wt 279.6 lb

## 2018-05-28 DIAGNOSIS — Z79899 Other long term (current) drug therapy: Secondary | ICD-10-CM | POA: Diagnosis not present

## 2018-05-28 DIAGNOSIS — C439 Malignant melanoma of skin, unspecified: Secondary | ICD-10-CM

## 2018-05-28 DIAGNOSIS — E119 Type 2 diabetes mellitus without complications: Secondary | ICD-10-CM | POA: Diagnosis not present

## 2018-05-28 DIAGNOSIS — L732 Hidradenitis suppurativa: Secondary | ICD-10-CM | POA: Diagnosis not present

## 2018-05-28 DIAGNOSIS — F411 Generalized anxiety disorder: Secondary | ICD-10-CM

## 2018-05-28 DIAGNOSIS — E785 Hyperlipidemia, unspecified: Secondary | ICD-10-CM | POA: Diagnosis not present

## 2018-05-28 DIAGNOSIS — Z1212 Encounter for screening for malignant neoplasm of rectum: Secondary | ICD-10-CM

## 2018-05-28 DIAGNOSIS — E161 Other hypoglycemia: Secondary | ICD-10-CM

## 2018-05-28 DIAGNOSIS — F17200 Nicotine dependence, unspecified, uncomplicated: Secondary | ICD-10-CM

## 2018-05-28 DIAGNOSIS — E1169 Type 2 diabetes mellitus with other specified complication: Secondary | ICD-10-CM

## 2018-05-28 DIAGNOSIS — Z1211 Encounter for screening for malignant neoplasm of colon: Secondary | ICD-10-CM

## 2018-05-28 DIAGNOSIS — M5432 Sciatica, left side: Secondary | ICD-10-CM

## 2018-05-28 DIAGNOSIS — Z6841 Body Mass Index (BMI) 40.0 and over, adult: Secondary | ICD-10-CM

## 2018-05-28 DIAGNOSIS — F1320 Sedative, hypnotic or anxiolytic dependence, uncomplicated: Secondary | ICD-10-CM

## 2018-05-28 LAB — BAYER DCA HB A1C WAIVED: HB A1C: 6.4 % (ref <7.0)

## 2018-05-28 MED ORDER — METFORMIN HCL 1000 MG PO TABS: 1000.0000 mg | ORAL_TABLET | Freq: Two times a day (BID) | ORAL | 3 refills | Status: DC

## 2018-05-28 MED ORDER — CEPHALEXIN 500 MG PO CAPS: 500.0000 mg | ORAL_CAPSULE | Freq: Four times a day (QID) | ORAL | 1 refills | Status: DC

## 2018-05-28 MED ORDER — DICLOFENAC SODIUM 75 MG PO TBEC: 75.0000 mg | DELAYED_RELEASE_TABLET | Freq: Two times a day (BID) | ORAL | 2 refills | Status: DC

## 2018-05-28 MED ORDER — CLONAZEPAM 0.5 MG PO TABS: 0.2500 mg | ORAL_TABLET | Freq: Two times a day (BID) | ORAL | 5 refills | Status: DC | PRN

## 2018-05-28 MED ORDER — BACLOFEN 10 MG PO TABS: 10.0000 mg | ORAL_TABLET | Freq: Three times a day (TID) | ORAL | 3 refills | Status: DC

## 2018-05-28 NOTE — Progress Notes (Signed)
Subjective:    Patient ID: Carolyn Hooper, female    DOB: Apr 29, 1960, 58 y.o.   MRN: [id number]  Chief Complaint  Patient presents with   Medical Management of Chronic Issues   Pt presents to the office today for chronic follow up. Pt states she is taking care of her mother who had  Back surgery in March 2019. She reports her mother is doing much better.  She reports her sister was diagnosed with melanoma in 2019. Denies any mole changes.  Diabetes  She presents for her follow-up diabetic visit. She has type 2 diabetes mellitus. Her disease course has been stable. Hypoglycemia symptoms include nervousness/anxiousness. Associated symptoms include visual change ("I have to wear reading glasses"). Pertinent negatives for diabetes include no blurred vision and no foot paresthesias. Symptoms are stable. Pertinent negatives for diabetic complications include no autonomic neuropathy, CVA, heart disease, nephropathy or peripheral neuropathy. Risk factors for coronary artery disease include dyslipidemia, diabetes mellitus, hypertension, sedentary lifestyle and post-menopausal. She is following a generally unhealthy diet. Her overall blood glucose range is 90-110 mg/dl. Eye exam is not current (educated on importance ).  Anxiety  Presents for follow-up visit. Symptoms include decreased concentration, excessive worry, irritability and nervous/anxious behavior. Symptoms occur occasionally. The severity of symptoms is mild.    Back Pain  This is a chronic problem. The current episode started more than 1 year ago. The problem occurs intermittently. The problem has been waxing and waning since onset. The pain is present in the lumbar spine and gluteal. The quality of the pain is described as aching. The pain radiates to the left thigh. The pain is at a severity of 5/10. The pain is moderate. The symptoms are aggravated by lying down. Associated symptoms include leg pain and numbness. Pertinent negatives  include no bladder incontinence, bowel incontinence or dysuria. She has tried NSAIDs and muscle relaxant for the symptoms. The treatment provided moderate relief.  Hyperlipidemia  This is a chronic problem. The current episode started more than 1 year ago. The problem is uncontrolled. Recent lipid tests were reviewed and are high. Exacerbating diseases include obesity. Associated symptoms include leg pain. She is currently on no antihyperlipidemic treatment. The current treatment provides no improvement of lipids.  Hidradenitis Suppurativa Pt takes keflex as needed. States she gets abscess every now and then in her groin and will take the antibiotic as needed that helps.     Review of Systems  Constitutional: Positive for irritability.  Eyes: Negative for blurred vision.  Gastrointestinal: Negative for bowel incontinence.  Genitourinary: Negative for bladder incontinence and dysuria.  Musculoskeletal: Positive for back pain.  Neurological: Positive for numbness.  Psychiatric/Behavioral: Positive for decreased concentration. The patient is nervous/anxious.   All other systems reviewed and are negative.      Objective:   Physical Exam Vitals signs reviewed.  Constitutional:      General: She is not in acute distress.    Appearance: She is well-developed. She is obese.  HENT:     Head: Normocephalic and atraumatic.     Right Ear: Tympanic membrane normal.     Left Ear: Tympanic membrane normal.  Eyes:     Pupils: Pupils are equal, round, and reactive to light.  Neck:     Musculoskeletal: Normal range of motion and neck supple.     Thyroid: No thyromegaly.  Cardiovascular:     Rate and Rhythm: Normal rate and regular rhythm.     Heart sounds: Normal heart sounds. No  murmur.  Pulmonary:     Effort: Pulmonary effort is normal. No respiratory distress.     Breath sounds: Normal breath sounds. No wheezing.  Abdominal:     General: Bowel sounds are normal. There is no distension.      Palpations: Abdomen is soft.     Tenderness: There is no abdominal tenderness.  Musculoskeletal: Normal range of motion.        General: No tenderness.     Right lower leg: Edema (trace) present.     Left lower leg: Edema (trace) present.  Skin:    General: Skin is warm and dry.     Comments: Dry, flaky skin  Neurological:     Mental Status: She is alert and oriented to person, place, and time.     Cranial Nerves: No cranial nerve deficit.     Deep Tendon Reflexes: Reflexes are normal and symmetric.  Psychiatric:        Behavior: Behavior normal.        Thought Content: Thought content normal.        Judgment: Judgment normal.    Diabetic Foot Exam - Simple   Simple Foot Form Diabetic Foot exam was performed with the following findings:  Yes 05/28/2018 10:29 AM  Visual Inspection No deformities, no ulcerations, no other skin breakdown bilaterally:  Yes Sensation Testing Intact to touch and monofilament testing bilaterally:  Yes Pulse Check Posterior Tibialis and Dorsalis pulse intact bilaterally:  Yes Comments      BP 127/74    Pulse 77    Ht '5\' 7"'  (1.702 m)    Wt 279 lb 9.6 oz (126.8 kg)    BMI 43.79 kg/m      Assessment & Plan:  ADHYA COCCO comes in today with chief complaint of Medical Management of Chronic Issues   Diagnosis and orders addressed:  1. Diabetes mellitus without complication ([name]) - Bayer DCA Hb A1c Waived - CBC with Differential/Platelet - CMP14+EGFR - Microalbumin / creatinine urine ratio - metFORMIN (GLUCOPHAGE) 1000 MG tablet; Take 1 tablet (1,000 mg total) by mouth 2 (two) times daily with a meal.  Dispense: 180 tablet; Refill: 3  2. Hyperinsulinemia - CBC with Differential/Platelet - CMP14+EGFR - metFORMIN (GLUCOPHAGE) 1000 MG tablet; Take 1 tablet (1,000 mg total) by mouth 2 (two) times daily with a meal.  Dispense: 180 tablet; Refill: 3  3. Hyperlipidemia associated with type 2 diabetes mellitus ([place]) - CBC with  Differential/Platelet - CMP14+EGFR - Lipid panel  4. Hidradenitis suppurativa - CBC with Differential/Platelet - CMP14+EGFR - cephALEXin (KEFLEX) 500 MG capsule; Take 1 capsule (500 mg total) by mouth 4 (four) times daily.  Dispense: 28 capsule; Refill: 1  5. Morbid obesity with BMI of 40.0-44.9, adult (HCC) - CBC with Differential/Platelet - CMP14+EGFR  6. Generalized anxiety disorder - CBC with Differential/Platelet - CMP14+EGFR - clonazePAM (KLONOPIN) 0.5 MG tablet; Take 0.5 tablets (0.25 mg total) by mouth 2 (two) times daily as needed. For anxiety  Dispense: 30 tablet; Refill: 5  7. Current smoker - CBC with Differential/Platelet - CMP14+EGFR  8. Controlled substance agreement signed - CBC with Differential/Platelet - CMP14+EGFR - ToxASSURE Select 13 (MW), Urine - clonazePAM (KLONOPIN) 0.5 MG tablet; Take 0.5 tablets (0.25 mg total) by mouth 2 (two) times daily as needed. For anxiety  Dispense: 30 tablet; Refill: 5  9. Benzodiazepine dependence (HCC) - CBC with Differential/Platelet - CMP14+EGFR - ToxASSURE Select 13 (MW), Urine - clonazePAM (KLONOPIN) 0.5 MG tablet; Take 0.5 tablets (0.25 mg  total) by mouth 2 (two) times daily as needed. 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 Colon cancer screening - Cologuard  12. Screening for malignant neoplasm of the rectum - Cologuard  13. Left sciatic nerve pain - baclofen (LIORESAL) 10 MG tablet; Take 1 tablet (10 mg total) by mouth 3 (three) times daily.  Dispense: 30 each; Refill: 3 - diclofenac (VOLTAREN) 75 MG EC tablet; Take 1 tablet (75 mg total) by mouth 2 (two) times daily.  Dispense: 30 tablet; Refill: 2   Labs pending Pt reviewed in [place] controlled database- No red flags Health Maintenance reviewed Diet and exercise encouraged  Follow up plan: 6 months    Evelina Dun, FNP

## 2018-05-29 LAB — CMP14+EGFR
ALT: 32 IU/L (ref 0–32)
AST: 22 IU/L (ref 0–40)
Albumin/Globulin Ratio: 1.6 (ref 1.2–2.2)
Albumin: 4.1 g/dL (ref 3.8–4.9)
Alkaline Phosphatase: 87 IU/L (ref 39–117)
BUN/Creatinine Ratio: 17 (ref 9–23)
BUN: 16 mg/dL (ref 6–24)
Bilirubin Total: 0.3 mg/dL (ref 0.0–1.2)
CALCIUM: 10.1 mg/dL (ref 8.7–10.2)
CHLORIDE: 97 mmol/L (ref 96–106)
CO2: 27 mmol/L (ref 20–29)
Creatinine, Ser: 0.92 mg/dL (ref 0.57–1.00)
GFR calc non Af Amer: 69 mL/min/{1.73_m2} (ref >59)
GFR, EST AFRICAN AMERICAN: 79 mL/min/{1.73_m2} (ref >59)
GLUCOSE: 127 mg/dL — AB (ref 65–99)
Globulin, Total: 2.6 g/dL (ref 1.5–4.5)
Potassium: 4.8 mmol/L (ref 3.5–5.2)
Sodium: 139 mmol/L (ref 134–144)
Total Protein: 6.7 g/dL (ref 6.0–8.5)

## 2018-05-29 LAB — LIPID PANEL
Chol/HDL Ratio: 5.4 ratio — ABNORMAL HIGH (ref 0.0–4.4)
Cholesterol, Total: 195 mg/dL (ref 100–199)
HDL: 36 mg/dL — AB (ref >39)
LDL Calculated: 118 mg/dL — ABNORMAL HIGH (ref 0–99)
Triglycerides: 203 mg/dL — ABNORMAL HIGH (ref 0–149)
VLDL CHOLESTEROL CAL: 41 mg/dL — AB (ref 5–40)

## 2018-05-29 LAB — CBC WITH DIFFERENTIAL/PLATELET
BASOS ABS: 0.1 10*3/uL (ref 0.0–0.2)
Basos: 1 %
EOS (ABSOLUTE): 0.5 10*3/uL — AB (ref 0.0–0.4)
Eos: 3 %
Hematocrit: 48.5 % — ABNORMAL HIGH (ref 34.0–46.6)
Hemoglobin: 16.1 g/dL — ABNORMAL HIGH (ref 11.1–15.9)
IMMATURE GRANS (ABS): 0.1 10*3/uL (ref 0.0–0.1)
IMMATURE GRANULOCYTES: 1 %
LYMPHS: 29 %
Lymphocytes Absolute: 4.6 10*3/uL — ABNORMAL HIGH (ref 0.7–3.1)
MCH: 28.6 pg (ref 26.6–33.0)
MCHC: 33.2 g/dL (ref 31.5–35.7)
MCV: 86 fL (ref 79–97)
MONOS ABS: 0.9 10*3/uL (ref 0.1–0.9)
Monocytes: 6 %
NEUTROS PCT: 60 %
Neutrophils Absolute: 9.8 10*3/uL — ABNORMAL HIGH (ref 1.4–7.0)
PLATELETS: 309 10*3/uL (ref 150–450)
RBC: 5.63 x10E6/uL — AB (ref 3.77–5.28)
RDW: 13.7 % (ref 11.7–15.4)
WBC: 16 10*3/uL — ABNORMAL HIGH (ref 3.4–10.8)

## 2018-05-29 LAB — MICROALBUMIN / CREATININE URINE RATIO
CREATININE, UR: 88.5 mg/dL
Microalb/Creat Ratio: <3 mg/g creat (ref 0–29)
Microalbumin, Urine: <3 ug/mL

## 2018-06-02 ENCOUNTER — Other Ambulatory Visit: Payer: Self-pay | Admitting: Family

## 2018-06-02 ENCOUNTER — Other Ambulatory Visit: Payer: Self-pay | Admitting: *Deleted

## 2018-06-02 MED ORDER — ATORVASTATIN CALCIUM 20 MG PO TABS: 20.0000 mg | ORAL_TABLET | Freq: Every day | ORAL | 1 refills | Status: DC

## 2018-06-03 LAB — TOXASSURE SELECT 13 (MW), URINE

## 2018-06-04 ENCOUNTER — Other Ambulatory Visit: Payer: Self-pay

## 2018-06-04 DIAGNOSIS — C439 Malignant melanoma of skin, unspecified: Secondary | ICD-10-CM

## 2018-09-11 ENCOUNTER — Encounter: Payer: Self-pay | Admitting: Family

## 2018-09-11 ENCOUNTER — Ambulatory Visit (INDEPENDENT_AMBULATORY_CARE_PROVIDER_SITE_OTHER): Payer: BC Managed Care – PPO | Admitting: Family

## 2018-09-11 ENCOUNTER — Other Ambulatory Visit: Payer: Self-pay

## 2018-09-11 DIAGNOSIS — E785 Hyperlipidemia, unspecified: Secondary | ICD-10-CM

## 2018-09-11 DIAGNOSIS — Z79899 Other long term (current) drug therapy: Secondary | ICD-10-CM

## 2018-09-11 DIAGNOSIS — F411 Generalized anxiety disorder: Secondary | ICD-10-CM | POA: Diagnosis not present

## 2018-09-11 DIAGNOSIS — F1320 Sedative, hypnotic or anxiolytic dependence, uncomplicated: Secondary | ICD-10-CM

## 2018-09-11 DIAGNOSIS — F17200 Nicotine dependence, unspecified, uncomplicated: Secondary | ICD-10-CM

## 2018-09-11 DIAGNOSIS — E1169 Type 2 diabetes mellitus with other specified complication: Secondary | ICD-10-CM

## 2018-09-11 DIAGNOSIS — Z6841 Body Mass Index (BMI) 40.0 and over, adult: Secondary | ICD-10-CM

## 2018-09-11 DIAGNOSIS — E119 Type 2 diabetes mellitus without complications: Secondary | ICD-10-CM | POA: Diagnosis not present

## 2018-09-11 DIAGNOSIS — E161 Other hypoglycemia: Secondary | ICD-10-CM

## 2018-09-11 DIAGNOSIS — L732 Hidradenitis suppurativa: Secondary | ICD-10-CM

## 2018-09-11 DIAGNOSIS — M5432 Sciatica, left side: Secondary | ICD-10-CM

## 2018-09-11 MED ORDER — CLONAZEPAM 0.5 MG PO TABS: 0.2500 mg | ORAL_TABLET | Freq: Two times a day (BID) | ORAL | 5 refills | Status: DC | PRN

## 2018-09-11 MED ORDER — BACLOFEN 10 MG PO TABS: 10.0000 mg | ORAL_TABLET | Freq: Three times a day (TID) | ORAL | 3 refills | Status: DC

## 2018-09-11 MED ORDER — CEPHALEXIN 500 MG PO CAPS: 500.0000 mg | ORAL_CAPSULE | Freq: Four times a day (QID) | ORAL | 1 refills | Status: DC

## 2018-09-11 MED ORDER — METFORMIN HCL 850 MG PO TABS: 850.0000 mg | ORAL_TABLET | Freq: Two times a day (BID) | ORAL | 2 refills | Status: DC

## 2018-09-11 MED ORDER — METFORMIN HCL 1000 MG PO TABS: 1000.0000 mg | ORAL_TABLET | Freq: Two times a day (BID) | ORAL | 3 refills | Status: DC

## 2018-09-11 MED ORDER — DICLOFENAC SODIUM 75 MG PO TBEC: 75.0000 mg | DELAYED_RELEASE_TABLET | Freq: Two times a day (BID) | ORAL | 2 refills | Status: DC

## 2018-09-11 NOTE — Progress Notes (Signed)
Virtual Visit via telephone Note  I connected with Carolyn Hooper on 09/11/18 at 8:02 AM by telephone and verified that I am speaking with the correct person using two identifiers. Carolyn Hooper is currently located at work and no one is currently with her during visit. The provider, Evelina Dun, FNP is located in their office at time of visit.  I discussed the limitations, risks, security and privacy concerns of performing an evaluation and management service by telephone and the availability of in person appointments. I also discussed with the patient that there may be a patient responsible charge related to this service. The patient expressed understanding and agreed to proceed.   History and Present Illness:  Pt calls the office today for chronic follow up. She states her anxiety has been increased because of COVID and her ex husband has transferred to hospice.  Diabetes She presents for her follow-up diabetic visit. She has type 2 diabetes mellitus. Her disease course has been stable. Hypoglycemia symptoms include nervousness/anxiousness. Pertinent negatives for diabetes include no blurred vision, no foot paresthesias and no visual change. Symptoms are stable. Pertinent negatives for diabetic complications include no CVA, heart disease, nephropathy or peripheral neuropathy. Risk factors for coronary artery disease include dyslipidemia, diabetes mellitus, hypertension and sedentary lifestyle. She is following a generally healthy diet. (Does not check BS at home regularly )  Hyperlipidemia This is a chronic problem. The current episode started more than 1 year ago. The problem is uncontrolled. Recent lipid tests were reviewed and are high. Exacerbating diseases include obesity. Associated symptoms include leg pain. Current antihyperlipidemic treatment includes statins and diet change. The current treatment provides mild improvement of lipids. Risk factors for coronary artery disease include  dyslipidemia, diabetes mellitus, a sedentary lifestyle and post-menopausal.  Anxiety Presents for follow-up visit. Symptoms include decreased concentration, depressed mood, excessive worry, irritability, nervous/anxious behavior, palpitations, panic and restlessness. Symptoms occur most days. The severity of symptoms is moderate. The quality of sleep is good.    Back Pain This is a chronic problem. The current episode started more than 1 year ago. The problem occurs intermittently. The problem has been rapidly improving since onset. The pain is present in the gluteal and lumbar spine. The quality of the pain is described as aching. The pain is moderate. Associated symptoms include leg pain, numbness and tingling. She has tried NSAIDs and muscle relaxant for the symptoms. The treatment provided moderate relief.  Hidradenitis Suppurativa PT uses keflex as needed for abscess flare up. States she last flare up was around 2 months ago.   Current opioids rx- Klonopin 0.5 mg BID as needed  # meds rx- 30 Effectiveness of current meds-stable Adverse reactions form pain meds-none Morphine equivalent- 0  Pill count performed-No Last drug screen - 05/28/18 ( high risk q85m, moderate risk q40m, low risk yearly ) Urine drug screen today- No Was the [place] reviewed- Yes  If yes were their any concerning findings? - None, patient only takes Klonopin as needed, has not had fill since 05/28/18.  No flowsheet data found.   Pain contract signed on: 05/29/18    Review of Systems  Constitutional: Positive for irritability.  Eyes: Negative for blurred vision.  Cardiovascular: Positive for palpitations.  Musculoskeletal: Positive for back pain.  Neurological: Positive for tingling and numbness.  Psychiatric/Behavioral: Positive for decreased concentration. The patient is nervous/anxious.      Observations/Objective: No SOB or distress   Assessment and Plan: Carolyn Hooper comes in today with  chief  complaint of No chief complaint on file.   Diagnosis and orders addressed:  1. Diabetes mellitus without complication (HCC) - metFORMIN (GLUCOPHAGE) 850 MG tablet; Take 1 tablet (1,000 mg total) by mouth 2 (two) times daily with a meal.  Dispense: 180 tablet; Refill: 3  2. Hyperlipidemia associated with type 2 diabetes mellitus ([place])  3. Morbid obesity with BMI of 40.0-44.9, adult ([place])  4. Generalized anxiety disorder - clonazePAM (KLONOPIN) 0.5 MG tablet; Take 0.5 tablets (0.25 mg total) by mouth 2 (two) times daily as needed. For anxiety  Dispense: 30 tablet; Refill: 5  5. Current smoker Smoking cessation discussed  6. Controlled substance agreement signed - clonazePAM (KLONOPIN) 0.5 MG tablet; Take 0.5 tablets (0.25 mg total) by mouth 2 (two) times daily as needed. For anxiety  Dispense: 30 tablet; Refill: 5  7. Benzodiazepine dependence (HCC) - clonazePAM (KLONOPIN) 0.5 MG tablet; Take 0.5 tablets (0.25 mg total) by mouth 2 (two) times daily as needed. For anxiety  Dispense: 30 tablet; Refill: 5  8. Left sciatic nerve pain - baclofen (LIORESAL) 10 MG tablet; Take 1 tablet (10 mg total) by mouth 3 (three) times daily.  Dispense: 30 each; Refill: 3 - diclofenac (VOLTAREN) 75 MG EC tablet; Take 1 tablet (75 mg total) by mouth 2 (two) times daily.  Dispense: 30 tablet; Refill: 2  9. Hidradenitis suppurativa Keep clean and dry Warm compressions - cephALEXin (KEFLEX) 500 MG capsule; Take 1 capsule (500 mg total) by mouth 4 (four) times daily.  Dispense: 28 capsule; Refill: 1  10. Hyperinsulinemia - metFORMIN (GLUCOPHAGE) 850 MG tablet; Take 1 tablet (1,000 mg total) by mouth 2 (two) times daily with a meal.  Dispense: 180 tablet; Refill: 3   Labs reviewed Health Maintenance reviewed Diet and exercise encouraged  Follow up plan: 3 months      I discussed the assessment and treatment plan with the patient. The patient was provided an opportunity to ask questions and all  were answered. The patient agreed with the plan and demonstrated an understanding of the instructions.   The patient was advised to call back or seek an in-person evaluation if the symptoms worsen or if the condition fails to improve as anticipated.  The above assessment and management plan was discussed with the patient. The patient verbalized understanding of and has agreed to the management plan. Patient is aware to call the clinic if symptoms persist or worsen. Patient is aware when to return to the clinic for a follow-up visit. Patient educated on when it is appropriate to go to the emergency department.   Time call ended:  8:35 AM  I provided 32 minutes of non-face-to-face time during this encounter.    Evelina Dun, FNP

## 2018-12-10 ENCOUNTER — Encounter: Payer: Self-pay | Admitting: Family

## 2018-12-10 ENCOUNTER — Ambulatory Visit (INDEPENDENT_AMBULATORY_CARE_PROVIDER_SITE_OTHER): Payer: BC Managed Care – PPO | Admitting: Family

## 2018-12-10 DIAGNOSIS — E1169 Type 2 diabetes mellitus with other specified complication: Secondary | ICD-10-CM | POA: Diagnosis not present

## 2018-12-10 DIAGNOSIS — E119 Type 2 diabetes mellitus without complications: Secondary | ICD-10-CM

## 2018-12-10 DIAGNOSIS — F17200 Nicotine dependence, unspecified, uncomplicated: Secondary | ICD-10-CM

## 2018-12-10 DIAGNOSIS — E785 Hyperlipidemia, unspecified: Secondary | ICD-10-CM

## 2018-12-10 DIAGNOSIS — Z6841 Body Mass Index (BMI) 40.0 and over, adult: Secondary | ICD-10-CM

## 2018-12-10 DIAGNOSIS — F411 Generalized anxiety disorder: Secondary | ICD-10-CM | POA: Diagnosis not present

## 2018-12-10 DIAGNOSIS — F1320 Sedative, hypnotic or anxiolytic dependence, uncomplicated: Secondary | ICD-10-CM

## 2018-12-10 DIAGNOSIS — Z79899 Other long term (current) drug therapy: Secondary | ICD-10-CM

## 2018-12-10 MED ORDER — CLONAZEPAM 0.5 MG PO TABS: 0.2500 mg | ORAL_TABLET | Freq: Two times a day (BID) | ORAL | 5 refills | Status: DC | PRN

## 2018-12-10 NOTE — Progress Notes (Signed)
Virtual Visit via telephone Note Due to COVID-19 pandemic this visit was conducted virtually. This visit type was conducted due to national recommendations for restrictions regarding the COVID-19 Pandemic (e.g. social distancing, sheltering in place) in an effort to limit this patient's exposure and mitigate transmission in our community. All issues noted in this document were discussed and addressed.  A physical exam was not performed with this format.  I connected with Carolyn Hooper on 12/10/18 at 9:05 AM by telephone and verified that I am speaking with the correct person using two identifiers. Carolyn Hooper is currently located at home  and friend is currently with her during visit. The provider, Evelina Dun, FNP is located in their office at time of visit.  I discussed the limitations, risks, security and privacy concerns of performing an evaluation and management service by telephone and the availability of in person appointments. I also discussed with the patient that there may be a patient responsible charge related to this service. The patient expressed understanding and agreed to proceed.   History and Present Illness:  Diabetes She presents for her follow-up diabetic visit. She has type 2 diabetes mellitus. Hypoglycemia symptoms include nervousness/anxiousness. There are no diabetic associated symptoms. Pertinent negatives for diabetes include no blurred vision, no foot paresthesias and no visual change. There are no hypoglycemic complications. Symptoms are stable. Pertinent negatives for diabetic complications include no CVA, heart disease or peripheral neuropathy. Risk factors for coronary artery disease include dyslipidemia, diabetes mellitus, hypertension and sedentary lifestyle. She is following a generally healthy diet. Her overall blood glucose range is 110-130 mg/dl. Eye exam is not current.  Hyperlipidemia This is a chronic problem. The current episode started more than 1  year ago. The problem is uncontrolled. Recent lipid tests were reviewed and are high. Exacerbating diseases include obesity. Current antihyperlipidemic treatment includes diet change. The current treatment provides no improvement of lipids. Risk factors for coronary artery disease include dyslipidemia, diabetes mellitus, hypertension, a sedentary lifestyle and post-menopausal.  Anxiety Presents for follow-up visit. Symptoms include decreased concentration, excessive worry, irritability, nervous/anxious behavior and restlessness. Symptoms occur occasionally. The severity of symptoms is moderate. The quality of sleep is good.        Review of Systems  Constitutional: Positive for irritability.  Eyes: Negative for blurred vision.  Psychiatric/Behavioral: Positive for decreased concentration. The patient is nervous/anxious.      Observations/Objective: No SOB or distress noted   Assessment and Plan: 1. Diabetes mellitus without complication ([place])  2. Hyperlipidemia associated with type 2 diabetes mellitus ([place])  3. Morbid obesity with BMI of 40.0-44.9, adult ([place])  4. Generalized anxiety disorder - clonazePAM (KLONOPIN) 0.5 MG tablet; Take 0.5 tablets (0.25 mg total) by mouth 2 (two) times daily as needed. For anxiety  Dispense: 30 tablet; Refill: 5  5. Current smoker  6. Controlled substance agreement signed - clonazePAM (KLONOPIN) 0.5 MG tablet; Take 0.5 tablets (0.25 mg total) by mouth 2 (two) times daily as needed. For anxiety  Dispense: 30 tablet; Refill: 5  7. Benzodiazepine dependence (HCC) - clonazePAM (KLONOPIN) 0.5 MG tablet; Take 0.5 tablets (0.25 mg total) by mouth 2 (two) times daily as needed. For anxiety  Dispense: 30 tablet; Refill: 5  Lbs reviewed Pt reviewed in [place] controlled database- No red flags noted RTO in 6 months    I discussed the assessment and treatment plan with the patient. The patient was provided an opportunity to ask questions and all were  answered. The patient agreed  with the plan and demonstrated an understanding of the instructions.   The patient was advised to call back or seek an in-person evaluation if the symptoms worsen or if the condition fails to improve as anticipated.  The above assessment and management plan was discussed with the patient. The patient verbalized understanding of and has agreed to the management plan. Patient is aware to call the clinic if symptoms persist or worsen. Patient is aware when to return to the clinic for a follow-up visit. Patient educated on when it is appropriate to go to the emergency department.   Time call ended: 9:24 AM     I provided 19 minutes of non-face-to-face time during this encounter.    Evelina Dun, FNP

## 2019-06-14 ENCOUNTER — Ambulatory Visit: Payer: Self-pay | Admitting: Family

## 2019-07-21 ENCOUNTER — Other Ambulatory Visit: Payer: Self-pay | Admitting: Family

## 2019-07-21 DIAGNOSIS — Z1231 Encounter for screening mammogram for malignant neoplasm of breast: Secondary | ICD-10-CM

## 2019-07-22 ENCOUNTER — Other Ambulatory Visit: Payer: Self-pay | Admitting: Family

## 2019-07-22 DIAGNOSIS — Z79899 Other long term (current) drug therapy: Secondary | ICD-10-CM

## 2019-07-22 DIAGNOSIS — F411 Generalized anxiety disorder: Secondary | ICD-10-CM

## 2019-07-22 DIAGNOSIS — F1320 Sedative, hypnotic or anxiolytic dependence, uncomplicated: Secondary | ICD-10-CM

## 2019-08-17 DIAGNOSIS — R52 Pain, unspecified: Secondary | ICD-10-CM | POA: Diagnosis not present

## 2019-08-17 DIAGNOSIS — M545 Low back pain: Secondary | ICD-10-CM | POA: Diagnosis not present

## 2019-08-17 DIAGNOSIS — M5489 Other dorsalgia: Secondary | ICD-10-CM | POA: Diagnosis not present

## 2019-08-17 DIAGNOSIS — R5381 Other malaise: Secondary | ICD-10-CM | POA: Diagnosis not present

## 2019-08-18 ENCOUNTER — Other Ambulatory Visit: Payer: Self-pay

## 2019-08-18 ENCOUNTER — Encounter (HOSPITAL_COMMUNITY): Payer: Self-pay | Admitting: Emergency Medicine

## 2019-08-18 ENCOUNTER — Emergency Department (HOSPITAL_COMMUNITY)
Admission: EM | Admit: 2019-08-18 | Discharge: 2019-08-18 | Disposition: A | Discharge status: Home / Self Care | Payer: BC Managed Care – PPO | Attending: Emergency Medicine | Admitting: Emergency Medicine

## 2019-08-18 DIAGNOSIS — M5432 Sciatica, left side: Secondary | ICD-10-CM | POA: Diagnosis not present

## 2019-08-18 DIAGNOSIS — M79605 Pain in left leg: Secondary | ICD-10-CM | POA: Diagnosis not present

## 2019-08-18 DIAGNOSIS — F17210 Nicotine dependence, cigarettes, uncomplicated: Secondary | ICD-10-CM | POA: Insufficient documentation

## 2019-08-18 DIAGNOSIS — E119 Type 2 diabetes mellitus without complications: Secondary | ICD-10-CM | POA: Insufficient documentation

## 2019-08-18 DIAGNOSIS — Z79899 Other long term (current) drug therapy: Secondary | ICD-10-CM | POA: Diagnosis not present

## 2019-08-18 DIAGNOSIS — M5442 Lumbago with sciatica, left side: Secondary | ICD-10-CM | POA: Diagnosis not present

## 2019-08-18 MED ORDER — DEXAMETHASONE SODIUM PHOSPHATE 10 MG/ML IJ SOLN
10.0000 mg | Freq: Once | INTRAMUSCULAR | Status: AC
  Administered 2019-08-18: 10 mg via INTRAMUSCULAR
  Filled 2019-08-18: qty 1

## 2019-08-18 MED ORDER — ONDANSETRON 8 MG PO TBDP
8.0000 mg | ORAL_TABLET | Freq: Once | ORAL | Status: AC
  Administered 2019-08-18: 8 mg via ORAL
  Filled 2019-08-18: qty 1

## 2019-08-18 MED ORDER — HYDROCODONE-ACETAMINOPHEN 5-325 MG PO TABS: 1.0000 | ORAL_TABLET | ORAL | 0 refills | Status: DC | PRN

## 2019-08-18 MED ORDER — METHYLPREDNISOLONE 4 MG PO TBPK: ORAL_TABLET | ORAL | 0 refills | Status: DC

## 2019-08-18 MED ORDER — HYDROMORPHONE HCL 1 MG/ML IJ SOLN
1.0000 mg | Freq: Once | INTRAMUSCULAR | Status: AC
  Administered 2019-08-18: 1 mg via INTRAMUSCULAR
  Filled 2019-08-18: qty 1

## 2019-08-18 NOTE — ED Provider Notes (Signed)
[facility] Provider Note   CSN: [id number] Arrival date & time: 08/18/19  0012     History Chief Complaint  Patient presents with  . Leg Pain    Carolyn Hooper is a 59 y.o. female.  Patient presents to the emergency department for evaluation of left hip and leg pain.  Patient reports that she has a history of recurrent sciatica.  She started having pain approximately 10 days ago.  Tonight the pain worsens.  Pain is sharp and stabbing behind the left hip and radiates to the thigh area.  No numbness, tingling or weakness.  No change in bowel or bladder function.  She has not had any falls.        Past Medical History:  Diagnosis Date  . Abscess    hydroadenitis superativa  . Anxiety   . Hyperinsulinemia   . Neuroma    audiotry neuroma  . Vertigo     Patient Active Problem List   Diagnosis Date Noted  . Controlled substance agreement signed 05/28/2018  . Benzodiazepine dependence ([place]) 05/28/2018  . Hyperlipidemia associated with type 2 diabetes mellitus ([place]) 04/11/2016  . Morbid obesity with BMI of 40.0-44.9, adult ([place]) 04/11/2016  . Current smoker 04/11/2016  . Hidradenitis suppurativa 04/11/2016  . Generalized anxiety disorder 08/19/2013  . Diabetes mellitus without complication ([place]) 86/76/1951  . Hyperinsulinemia 08/19/2013    Past Surgical History:  Procedure Laterality Date  . ABDOMINAL HYSTERECTOMY  2006   remaining left ovary  . SHOULDER SURGERY Right 1960s     OB History   No obstetric history on file.     Family History  Problem Relation Age of Onset  . COPD Mother   . Heart disease Father   . Diabetes Father   . Kidney disease Father   . Arthritis Father        RA  . Liver disease Father     Social History   Tobacco Use  . Smoking status: Current Every Day Smoker    Packs/day: 0.75  . Smokeless tobacco: Never Used  Substance Use Topics  . Alcohol use: Yes    Comment: very rare  . Drug use: No    Home  Medications Prior to Admission medications   Medication Sig Start Date End Date Taking? Authorizing Provider  baclofen (LIORESAL) 10 MG tablet Take 1 tablet (10 mg total) by mouth 3 (three) times daily. 09/11/18   [name], FNP  clonazePAM (KLONOPIN) 0.5 MG tablet Take 0.5 tablets (0.25 mg total) by mouth 2 (two) times daily as needed. For anxiety 12/10/18   Evelina Dun A, FNP  diclofenac (VOLTAREN) 75 MG EC tablet Take 1 tablet (75 mg total) by mouth 2 (two) times daily. 09/11/18   [name], FNP  HYDROcodone-acetaminophen (NORCO/VICODIN) 5-325 MG tablet Take 1 tablet by mouth every 4 (four) hours as needed for moderate pain. 08/18/19   Orpah Greek, MD  HYDROcodone-acetaminophen (NORCO/VICODIN) 5-325 MG tablet Take 1 tablet by mouth every 4 (four) hours as needed. 08/18/19   Orpah Greek, MD  metFORMIN (GLUCOPHAGE) 850 MG tablet Take 1 tablet (850 mg total) by mouth 2 (two) times daily with a meal. 09/11/18   [name], FNP  methylPREDNISolone (MEDROL DOSEPAK) 4 MG TBPK tablet As directed 08/18/19   Santana Gosdin, Gwenyth Allegra, MD    Allergies    Adhesive [tape]  Review of Systems   Review of Systems  Musculoskeletal: Positive for back pain.  All other  systems reviewed and are negative.   Physical Exam Updated Vital Signs BP (!) 106/57 (BP Location: Right Arm)   Pulse 62   Temp 98.6 F (37 C) (Oral)   Resp 17   Ht 5\' 8"  (1.727 m)   Wt 124.7 kg   SpO2 94%   BMI 41.81 kg/m   Physical Exam Vitals and nursing note reviewed.  Constitutional:      General: She is not in acute distress.    Appearance: Normal appearance. She is well-developed.  HENT:     Head: Normocephalic and atraumatic.     Right Ear: Hearing normal.     Left Ear: Hearing normal.     Nose: Nose normal.  Eyes:     Conjunctiva/sclera: Conjunctivae normal.     Pupils: Pupils are equal, round, and reactive to light.  Cardiovascular:     Rate and Rhythm: Regular rhythm.     Heart  sounds: S1 normal and S2 normal. No murmur. No friction rub. No gallop.   Pulmonary:     Effort: Pulmonary effort is normal. No respiratory distress.     Breath sounds: Normal breath sounds.  Chest:     Chest wall: No tenderness.  Abdominal:     General: Bowel sounds are normal.     Palpations: Abdomen is soft.     Tenderness: There is no abdominal tenderness. There is no guarding or rebound. Negative signs include Murphy's sign and McBurney's sign.     Hernia: No hernia is present.  Musculoskeletal:     Cervical back: Normal range of motion and neck supple.     Lumbar back: Tenderness present. Positive left straight leg raise test. Negative right straight leg raise test.       Back:     Left hip: No deformity or tenderness.  Skin:    General: Skin is warm and dry.     Findings: No rash.  Neurological:     Mental Status: She is alert and oriented to person, place, and time.     GCS: GCS eye subscore is 4. GCS verbal subscore is 5. GCS motor subscore is 6.     Cranial Nerves: No cranial nerve deficit.     Sensory: No sensory deficit.     Coordination: Coordination normal.  Psychiatric:        Speech: Speech normal.        Behavior: Behavior normal.        Thought Content: Thought content normal.     ED Results / Procedures / Treatments   Labs (all labs ordered are listed, but only abnormal results are displayed) Labs Reviewed - No data to display  EKG None  Radiology No results found.  Procedures Procedures (including critical care time)  Medications Ordered in ED Medications  dexamethasone (DECADRON) injection 10 mg (10 mg Intramuscular Given 08/18/19 0050)  HYDROmorphone (DILAUDID) injection 1 mg (1 mg Intramuscular Given 08/18/19 0050)  ondansetron (ZOFRAN-ODT) disintegrating tablet 8 mg (8 mg Oral Given 08/18/19 0049)    ED Course  I have reviewed the triage vital signs and the nursing notes.  Pertinent labs & imaging results that were available during my care of  the patient were reviewed by me and considered in my medical decision making (see chart for details).    MDM Rules/Calculators/A&P                      Patient presents to the ER with musculoskeletal back pain. Examination reveals  back tenderness without any associated neurologic findings. Patient's strength, sensation and reflexes were normal. There is no evidence of saddle anesthesia. Patient does not have a foot drop. Patient has not experienced any change in bowel or bladder function. As such, patient did not require any imaging or further studies. Patient was treated with analgesia.  She has improved.  Will discharge with analgesia, steroid course.  Follow-up with PCP.  Final Clinical Impression(s) / ED Diagnoses Final diagnoses:  Sciatica of left side    Rx / DC Orders ED Discharge Orders         Ordered    methylPREDNISolone (MEDROL DOSEPAK) 4 MG TBPK tablet     08/18/19 0125    HYDROcodone-acetaminophen (NORCO/VICODIN) 5-325 MG tablet  Every 4 hours PRN     08/18/19 0125    HYDROcodone-acetaminophen (NORCO/VICODIN) 5-325 MG tablet  Every 4 hours PRN     08/18/19 0125           Orpah Greek, MD 08/18/19 [phone]

## 2019-08-18 NOTE — ED Triage Notes (Signed)
Pt C/O left leg and back pain that started this morning. Pt reports taking flexeril and ibuprofen at 2130 with no relief.

## 2019-08-19 MED FILL — Hydrocodone-Acetaminophen Tab 5-325 MG: ORAL | Qty: 6 | Status: AC

## 2019-08-30 ENCOUNTER — Other Ambulatory Visit: Payer: Self-pay | Admitting: Family

## 2019-08-30 DIAGNOSIS — L732 Hidradenitis suppurativa: Secondary | ICD-10-CM

## 2019-09-02 DIAGNOSIS — L218 Other seborrheic dermatitis: Secondary | ICD-10-CM | POA: Diagnosis not present

## 2019-09-02 DIAGNOSIS — D2371 Other benign neoplasm of skin of right lower limb, including hip: Secondary | ICD-10-CM | POA: Diagnosis not present

## 2019-09-02 DIAGNOSIS — D485 Neoplasm of uncertain behavior of skin: Secondary | ICD-10-CM | POA: Diagnosis not present

## 2019-09-02 DIAGNOSIS — L821 Other seborrheic keratosis: Secondary | ICD-10-CM | POA: Diagnosis not present

## 2019-09-02 DIAGNOSIS — D1801 Hemangioma of skin and subcutaneous tissue: Secondary | ICD-10-CM | POA: Diagnosis not present

## 2019-09-02 DIAGNOSIS — L720 Epidermal cyst: Secondary | ICD-10-CM | POA: Diagnosis not present

## 2019-09-21 ENCOUNTER — Ambulatory Visit: Payer: BC Managed Care – PPO | Admitting: Family

## 2019-09-21 ENCOUNTER — Encounter: Payer: Self-pay | Admitting: Family

## 2019-09-21 ENCOUNTER — Other Ambulatory Visit: Payer: Self-pay

## 2019-09-21 VITALS — BP 113/72 | HR 74 | Temp 98.0°F | Ht 68.0 in | Wt 276.4 lb

## 2019-09-21 DIAGNOSIS — E1169 Type 2 diabetes mellitus with other specified complication: Secondary | ICD-10-CM

## 2019-09-21 DIAGNOSIS — E785 Hyperlipidemia, unspecified: Secondary | ICD-10-CM

## 2019-09-21 DIAGNOSIS — L732 Hidradenitis suppurativa: Secondary | ICD-10-CM | POA: Diagnosis not present

## 2019-09-21 DIAGNOSIS — F411 Generalized anxiety disorder: Secondary | ICD-10-CM

## 2019-09-21 DIAGNOSIS — F1320 Sedative, hypnotic or anxiolytic dependence, uncomplicated: Secondary | ICD-10-CM

## 2019-09-21 DIAGNOSIS — Z79899 Other long term (current) drug therapy: Secondary | ICD-10-CM | POA: Diagnosis not present

## 2019-09-21 DIAGNOSIS — E161 Other hypoglycemia: Secondary | ICD-10-CM

## 2019-09-21 DIAGNOSIS — Z6841 Body Mass Index (BMI) 40.0 and over, adult: Secondary | ICD-10-CM

## 2019-09-21 DIAGNOSIS — M5432 Sciatica, left side: Secondary | ICD-10-CM

## 2019-09-21 DIAGNOSIS — F17200 Nicotine dependence, unspecified, uncomplicated: Secondary | ICD-10-CM

## 2019-09-21 DIAGNOSIS — E119 Type 2 diabetes mellitus without complications: Secondary | ICD-10-CM | POA: Diagnosis not present

## 2019-09-21 LAB — BAYER DCA HB A1C WAIVED: HB A1C (BAYER DCA - WAIVED): 6.5 % (ref <7.0)

## 2019-09-21 MED ORDER — METFORMIN HCL 850 MG PO TABS: 850.0000 mg | ORAL_TABLET | Freq: Two times a day (BID) | ORAL | 2 refills | Status: DC

## 2019-09-21 MED ORDER — DICLOFENAC SODIUM 75 MG PO TBEC: 75.0000 mg | DELAYED_RELEASE_TABLET | Freq: Two times a day (BID) | ORAL | 2 refills | Status: DC

## 2019-09-21 MED ORDER — CLONAZEPAM 0.5 MG PO TABS: 0.2500 mg | ORAL_TABLET | Freq: Every day | ORAL | 5 refills | Status: DC

## 2019-09-21 MED ORDER — BLOOD GLUCOSE METER KIT: PACK | 0 refills | Status: DC

## 2019-09-21 MED ORDER — CEPHALEXIN 500 MG PO CAPS: 500.0000 mg | ORAL_CAPSULE | Freq: Two times a day (BID) | ORAL | 0 refills | Status: DC

## 2019-09-21 MED ORDER — METFORMIN HCL 1000 MG PO TABS: 1000.0000 mg | ORAL_TABLET | Freq: Two times a day (BID) | ORAL | 1 refills | Status: DC

## 2019-09-21 NOTE — Progress Notes (Signed)
Subjective:    Patient ID: Carolyn Hooper, female    DOB: 28-Jun-1960, 59 y.o.   MRN: [id number]  Chief Complaint  Patient presents with  . Diabetes   Pt presents to the office today for chronic follow up. Pt has hidradenitis suppurative and takes Keflex as needed. Pt saw Dermatologists last month and had tangential biopsy of her right ankle. Pt tolerated well, but would like me to look at the lesion to make sure it is "ok".  Diabetes She presents for her follow-up diabetic visit. She has type 2 diabetes mellitus. Her disease course has been stable. Pertinent negatives for hypoglycemia include no confusion. There are no diabetic associated symptoms. Pertinent negatives for diabetes include no blurred vision, no chest pain and no foot paresthesias. There are no hypoglycemic complications. Symptoms are stable. Pertinent negatives for diabetic complications include no CVA, nephropathy or peripheral neuropathy. Risk factors for coronary artery disease include diabetes mellitus, hypertension, post-menopausal and dyslipidemia. She is following a generally healthy diet. (Does not check BS at home) Eye exam is not current.  Hyperlipidemia This is a chronic problem. The current episode started more than 1 year ago. The problem is uncontrolled. Recent lipid tests were reviewed and are high. Pertinent negatives include no chest pain. Current antihyperlipidemic treatment includes diet change. The current treatment provides no improvement of lipids. Risk factors for coronary artery disease include dyslipidemia, diabetes mellitus, hypertension, a sedentary lifestyle and post-menopausal.  Anxiety Presents for follow-up visit. Symptoms include excessive worry and irritability. Patient reports no chest pain or confusion. Symptoms occur occasionally. The severity of symptoms is moderate. The quality of sleep is good.    Nicotine Dependence Presents for follow-up visit. Symptoms include irritability. Her urge  triggers include company of smokers. The symptoms have been stable. She smokes 1 pack of cigarettes per day.      Review of Systems  Constitutional: Positive for irritability.  Eyes: Negative for blurred vision.  Cardiovascular: Negative for chest pain.  Psychiatric/Behavioral: Negative for confusion.  All other systems reviewed and are negative.      Objective:   Physical Exam Vitals reviewed.  Constitutional:      General: She is not in acute distress.    Appearance: She is well-developed. She is obese.  HENT:     Head: Normocephalic and atraumatic.     Right Ear: Tympanic membrane normal.     Left Ear: Tympanic membrane normal.  Eyes:     Pupils: Pupils are equal, round, and reactive to light.  Neck:     Thyroid: No thyromegaly.  Cardiovascular:     Rate and Rhythm: Normal rate and regular rhythm.     Heart sounds: Normal heart sounds. No murmur heard.   Pulmonary:     Effort: Pulmonary effort is normal. No respiratory distress.     Breath sounds: Normal breath sounds. No wheezing.  Abdominal:     General: Bowel sounds are normal. There is no distension.     Palpations: Abdomen is soft.     Tenderness: There is no abdominal tenderness.  Musculoskeletal:        General: No tenderness. Normal range of motion.     Cervical back: Normal range of motion and neck supple.     Right lower leg: Edema (trace) present.     Left lower leg: Edema (trace) present.  Skin:    General: Skin is warm and dry.     Comments: Circular scabbed lesion on right lateral ankle  Neurological:  Mental Status: She is alert and oriented to person, place, and time.     Cranial Nerves: No cranial nerve deficit.     Deep Tendon Reflexes: Reflexes are normal and symmetric.  Psychiatric:        Behavior: Behavior normal.        Thought Content: Thought content normal.        Judgment: Judgment normal.    Diabetic Foot Exam - Simple   Simple Foot Form Diabetic Foot exam was performed with  the following findings: Yes 09/21/2019  8:56 AM  Visual Inspection No deformities, no ulcerations, no other skin breakdown bilaterally: Yes Sensation Testing Intact to touch and monofilament testing bilaterally: Yes Pulse Check Posterior Tibialis and Dorsalis pulse intact bilaterally: Yes Comments      BP 113/72   Pulse 74   Temp 98 F (36.7 C) (Temporal)   Ht 5' 8" (1.727 m)   Wt 276 lb 6.4 oz (125.4 kg)   BMI 42.03 kg/m  =   Assessment & Plan:  LADAYSHA SOUTAR comes in today with chief complaint of Diabetes   Diagnosis and orders addressed:  1. Type 2 diabetes mellitus with other specified complication, without long-term current use of insulin (HCC) - Microalbumin / creatinine urine ratio - Bayer DCA Hb A1c Waived - metFORMIN (GLUCOPHAGE) 1000 MG tablet; Take 1 tablet (1,000 mg total) by mouth 2 (two) times daily.  Dispense: 180 tablet; Refill: 1 - metFORMIN (GLUCOPHAGE) 850 MG tablet; Take 1 tablet (850 mg total) by mouth 2 (two) times daily with a meal.  Dispense: 180 tablet; Refill: 2 - CMP14+EGFR - CBC with Differential/Platelet - blood glucose meter kit and supplies; Dispense based on patient and insurance preference. Use up to four times daily as directed. (FOR ICD-10 E10.9, E11.9).  Dispense: 1 each; Refill: 0  2. Generalized anxiety disorder - ToxASSURE Select 13 (MW), Urine - clonazePAM (KLONOPIN) 0.5 MG tablet; Take 0.5 tablets (0.25 mg total) by mouth at bedtime. For anxiety  Dispense: 30 tablet; Refill: 5 - CMP14+EGFR - CBC with Differential/Platelet  3. Hyperlipidemia associated with type 2 diabetes mellitus ([place]) - CMP14+EGFR - CBC with Differential/Platelet - Lipid panel  4. Hyperinsulinemia - metFORMIN (GLUCOPHAGE) 850 MG tablet; Take 1 tablet (850 mg total) by mouth 2 (two) times daily with a meal.  Dispense: 180 tablet; Refill: 2 - CMP14+EGFR - CBC with Differential/Platelet  5. Hidradenitis suppurativa - CMP14+EGFR - CBC with  Differential/Platelet  6. Benzodiazepine dependence (HCC) - clonazePAM (KLONOPIN) 0.5 MG tablet; Take 0.5 tablets (0.25 mg total) by mouth at bedtime. For anxiety  Dispense: 30 tablet; Refill: 5 - CMP14+EGFR - CBC with Differential/Platelet  7. Controlled substance agreement signed - clonazePAM (KLONOPIN) 0.5 MG tablet; Take 0.5 tablets (0.25 mg total) by mouth at bedtime. For anxiety  Dispense: 30 tablet; Refill: 5 - CMP14+EGFR - CBC with Differential/Platelet  8. Current smoker - CMP14+EGFR - CBC with Differential/Platelet  9. Morbid obesity with BMI of 40.0-44.9, adult ([place]) - CMP14+EGFR - CBC with Differential/Platelet   Labs pending Health Maintenance reviewed- Pt will schedule colonoscopy and mammogram  Diet and exercise encouraged  Follow up plan: 6 months    Evelina Dun, FNP

## 2019-09-21 NOTE — Addendum Note (Signed)
Addended by: Brynda Peon F on: 09/21/2019 10:00 AM   Modules accepted: Orders

## 2019-09-21 NOTE — Patient Instructions (Signed)
Epidermal Cyst  An epidermal cyst is a sac made of skin tissue. The sac contains a substance called keratin. Keratin is a protein that is normally secreted through the hair follicles. When keratin becomes trapped in the top layer of skin (epidermis), it can form an epidermal cyst. Epidermal cysts can be found anywhere on your body. These cysts are usually harmless (benign), and they may not cause symptoms unless they become infected. What are the causes? This condition may be caused by:  A blocked hair follicle.  A hair that curls and re-enters the skin instead of growing straight out of the skin (ingrown hair).  A blocked pore.  Irritated skin.  An injury to the skin.  Certain conditions that are passed along from parent to child (inherited).  Human papillomavirus (HPV).  Long-term (chronic) sun damage to the skin. What increases the risk? The following factors may make you more likely to develop an epidermal cyst:  Having acne.  Being overweight.  Being 30-40 years old. What are the signs or symptoms? The only symptom of this condition may be a small, painless lump underneath the skin. When an epidermal cyst ruptures, it may become infected. Symptoms may include:  Redness.  Inflammation.  Tenderness.  Warmth.  Fever.  Keratin draining from the cyst. Keratin is grayish-white, bad-smelling substance.  Pus draining from the cyst. How is this diagnosed? This condition is diagnosed with a physical exam.  In some cases, you may have a sample of tissue (biopsy) taken from your cyst to be examined under a microscope or tested for bacteria.  You may be referred to a health care provider who specializes in skin care (dermatologist). How is this treated? In many cases, epidermal cysts go away on their own without treatment. If a cyst becomes infected, treatment may include:  Opening and draining the cyst, done by a health care provider. After draining, minor surgery to  remove the rest of the cyst may be done.  Antibiotic medicine.  Injections of medicines (steroids) that help to reduce inflammation.  Surgery to remove the cyst. Surgery may be done if the cyst: ? Becomes large. ? Bothers you. ? Has a chance of turning into cancer.  Do not try to open a cyst yourself. Follow these instructions at home:  Take over-the-counter and prescription medicines only as told by your health care provider.  If you were prescribed an antibiotic medicine, take it it as told by your health care provider. Do not stop using the antibiotic even if you start to feel better.  Keep the area around your cyst clean and dry.  Wear loose, dry clothing.  Avoid touching your cyst.  Check your cyst every day for signs of infection. Check for: ? Redness, swelling, or pain. ? Fluid or blood. ? Warmth. ? Pus or a bad smell.  Keep all follow-up visits as told by your health care provider. This is important. How is this prevented?  Wear clean, dry, clothing.  Avoid wearing tight clothing.  Keep your skin clean and dry. Take showers or baths every day. Contact a health care provider if:  Your cyst develops symptoms of infection.  Your condition is not improving or is getting worse.  You develop a cyst that looks different from other cysts you have had.  You have a fever. Get help right away if:  Redness spreads from the cyst into the surrounding area. Summary  An epidermal cyst is a sac made of skin tissue. These cysts are   usually harmless (benign), and they may not cause symptoms unless they become infected.  If a cyst becomes infected, treatment may include surgery to open and drain the cyst, or to remove it. Treatment may also include medicines by mouth or through an injection.  Take over-the-counter and prescription medicines only as told by your health care provider. If you were prescribed an antibiotic medicine, take it as told by your health care  provider. Do not stop using the antibiotic even if you start to feel better.  Contact a health care provider if your condition is not improving or is getting worse.  Keep all follow-up visits as told by your health care provider. This is important. This information is not intended to replace advice given to you by your health care provider. Make sure you discuss any questions you have with your health care provider. Document Revised: 06/25/2018 Document Reviewed: 09/15/2017 Elsevier Patient Education  2020 Elsevier Inc.  

## 2019-09-22 LAB — CMP14+EGFR
ALT: 30 IU/L (ref 0–32)
AST: 20 IU/L (ref 0–40)
Albumin/Globulin Ratio: 1.6 (ref 1.2–2.2)
Albumin: 4 g/dL (ref 3.8–4.9)
Alkaline Phosphatase: 91 IU/L (ref 48–121)
BUN/Creatinine Ratio: 16 (ref 9–23)
BUN: 13 mg/dL (ref 6–24)
Bilirubin Total: 0.2 mg/dL (ref 0.0–1.2)
CO2: 23 mmol/L (ref 20–29)
Calcium: 9.6 mg/dL (ref 8.7–10.2)
Chloride: 98 mmol/L (ref 96–106)
Creatinine, Ser: 0.8 mg/dL (ref 0.57–1.00)
GFR calc Af Amer: 93 mL/min/{1.73_m2} (ref >59)
GFR calc non Af Amer: 81 mL/min/{1.73_m2} (ref >59)
Globulin, Total: 2.5 g/dL (ref 1.5–4.5)
Glucose: 117 mg/dL — ABNORMAL HIGH (ref 65–99)
Potassium: 4.7 mmol/L (ref 3.5–5.2)
Sodium: 139 mmol/L (ref 134–144)
Total Protein: 6.5 g/dL (ref 6.0–8.5)

## 2019-09-22 LAB — LIPID PANEL
Chol/HDL Ratio: 5.2 ratio — ABNORMAL HIGH (ref 0.0–4.4)
Cholesterol, Total: 199 mg/dL (ref 100–199)
HDL: 38 mg/dL — ABNORMAL LOW (ref >39)
LDL Chol Calc (NIH): 128 mg/dL — ABNORMAL HIGH (ref 0–99)
Triglycerides: 186 mg/dL — ABNORMAL HIGH (ref 0–149)
VLDL Cholesterol Cal: 33 mg/dL (ref 5–40)

## 2019-09-22 LAB — CBC WITH DIFFERENTIAL/PLATELET
Basophils Absolute: 0.1 10*3/uL (ref 0.0–0.2)
Basos: 1 %
EOS (ABSOLUTE): 0.3 10*3/uL (ref 0.0–0.4)
Eos: 2 %
Hematocrit: 50.7 % — ABNORMAL HIGH (ref 34.0–46.6)
Hemoglobin: 16.1 g/dL — ABNORMAL HIGH (ref 11.1–15.9)
Immature Grans (Abs): 0.1 10*3/uL (ref 0.0–0.1)
Immature Granulocytes: 1 %
Lymphocytes Absolute: 4.2 10*3/uL — ABNORMAL HIGH (ref 0.7–3.1)
Lymphs: 28 %
MCH: 27.5 pg (ref 26.6–33.0)
MCHC: 31.8 g/dL (ref 31.5–35.7)
MCV: 87 fL (ref 79–97)
Monocytes Absolute: 0.9 10*3/uL (ref 0.1–0.9)
Monocytes: 6 %
Neutrophils Absolute: 9.3 10*3/uL — ABNORMAL HIGH (ref 1.4–7.0)
Neutrophils: 62 %
Platelets: 300 10*3/uL (ref 150–450)
RBC: 5.85 x10E6/uL — ABNORMAL HIGH (ref 3.77–5.28)
RDW: 14.3 % (ref 11.7–15.4)
WBC: 14.8 10*3/uL — ABNORMAL HIGH (ref 3.4–10.8)

## 2019-09-22 LAB — MICROALBUMIN / CREATININE URINE RATIO
Creatinine, Urine: 110.9 mg/dL
Microalb/Creat Ratio: 6 mg/g creat (ref 0–29)
Microalbumin, Urine: 6.1 ug/mL

## 2019-09-23 ENCOUNTER — Other Ambulatory Visit: Payer: Self-pay | Admitting: Family

## 2019-09-23 LAB — TOXASSURE SELECT 13 (MW), URINE

## 2019-09-23 MED ORDER — ATORVASTATIN CALCIUM 20 MG PO TABS
20.0000 mg | ORAL_TABLET | Freq: Every day | ORAL | 3 refills | Status: DC
Start: 2019-09-23 — End: 2020-07-10

## 2019-09-24 ENCOUNTER — Other Ambulatory Visit: Payer: Self-pay | Admitting: Family

## 2019-09-29 ENCOUNTER — Telehealth: Payer: Self-pay | Admitting: Family

## 2019-09-29 DIAGNOSIS — D72829 Elevated white blood cell count, unspecified: Secondary | ICD-10-CM

## 2019-09-29 NOTE — Telephone Encounter (Signed)
  REFERRAL REQUEST Telephone Note 09/29/2019  What type of referral do you need? Hematologist  Why do you need this referral? Patient stated Ref was supposed to be placed - VM left from Patient  Have you been seen at our office for this problem? Yes? (Advise that they may need an appointment with their PCP before a referral can be done)  Is there a particular doctor or location that you prefer? Dr. Learta Codding in [place].  Patient notified that referrals can take up to a week or longer to process. If they haven't heard anything within a week they should call back and speak with the referral department.

## 2019-09-30 NOTE — Telephone Encounter (Signed)
Hematologists referral placed.

## 2019-10-07 ENCOUNTER — Telehealth: Payer: Self-pay | Admitting: Oncology

## 2019-10-07 NOTE — Telephone Encounter (Signed)
Received a new hem referral from Evelina Dun, FNP at [facility]. Carolyn Hooper has been cld and scheduled to see Dr. Benay Spice on 10/4 at 2pm. Pt has requested to see Dr. Benay Spice specifically for care and declined other providers. I offered to schedule an earlier appt date and time, but the pt preferred to wait until October. Letter mailed.

## 2019-12-02 ENCOUNTER — Telehealth: Payer: Self-pay | Admitting: Family

## 2019-12-02 NOTE — Telephone Encounter (Signed)
Appointment scheduled.

## 2019-12-03 ENCOUNTER — Ambulatory Visit (INDEPENDENT_AMBULATORY_CARE_PROVIDER_SITE_OTHER): Payer: BC Managed Care – PPO | Admitting: Family Medicine

## 2019-12-03 ENCOUNTER — Encounter: Payer: Self-pay | Admitting: Family Medicine

## 2019-12-03 DIAGNOSIS — R110 Nausea: Secondary | ICD-10-CM

## 2019-12-03 DIAGNOSIS — R0602 Shortness of breath: Secondary | ICD-10-CM

## 2019-12-03 DIAGNOSIS — R05 Cough: Secondary | ICD-10-CM

## 2019-12-03 DIAGNOSIS — J069 Acute upper respiratory infection, unspecified: Secondary | ICD-10-CM | POA: Diagnosis not present

## 2019-12-03 DIAGNOSIS — L732 Hidradenitis suppurativa: Secondary | ICD-10-CM

## 2019-12-03 DIAGNOSIS — R059 Cough, unspecified: Secondary | ICD-10-CM

## 2019-12-03 MED ORDER — ONDANSETRON 4 MG PO TBDP: 4.0000 mg | ORAL_TABLET | Freq: Three times a day (TID) | ORAL | 0 refills | Status: DC | PRN

## 2019-12-03 MED ORDER — CEPHALEXIN 500 MG PO CAPS: 500.0000 mg | ORAL_CAPSULE | Freq: Two times a day (BID) | ORAL | 0 refills | Status: DC

## 2019-12-03 MED ORDER — ALBUTEROL SULFATE HFA 108 (90 BASE) MCG/ACT IN AERS: 2.0000 | INHALATION_SPRAY | Freq: Four times a day (QID) | RESPIRATORY_TRACT | 2 refills | Status: DC | PRN

## 2019-12-03 NOTE — Progress Notes (Signed)
Virtual Visit via Telephone Note  I connected with Carolyn Hooper on 12/03/19 at 2:31 PM by telephone and verified that I am speaking with the correct person using two identifiers. Carolyn Hooper is currently located at home and nobody is currently with her during this visit. The provider, Loman Brooklyn, FNP is located in their office at time of visit.  I discussed the limitations, risks, security and privacy concerns of performing an evaluation and management service by telephone and the availability of in person appointments. I also discussed with the patient that there may be a patient responsible charge related to this service. The patient expressed understanding and agreed to proceed.  Subjective: PCP: [name], FNP  Chief Complaint  Patient presents with  . URI   Patient complains of cough, headache, diarrhea, loss of taste, loss of smell and fatigue. She has had 5-7 episodes of diarrhea in the past 6 days. Additional symptoms include head congestion, sneezing, ear pain/pressure, facial pain/pressure, postnasal drainage, shortness of breath, wheezing and nausea. Onset of symptoms was 6 days ago, gradually worsening since that time. She is drinking plenty of fluids and urinating per her usual (actually a little more because she is pushing fluids). Evaluation to date: patient had a negative rapid COVID-19 test at [facility] three days ago. She does smoke. She is vaccinated against COVID-19. She would like to be tested with PCR for COVID-19 due to her symptoms.    ROS: Per HPI  Current Outpatient Medications:  .  atorvastatin (LIPITOR) 20 MG tablet, Take 1 tablet (20 mg total) by mouth daily., Disp: 90 tablet, Rfl: 3 .  blood glucose meter kit and supplies, Dispense based on patient and insurance preference. Use up to four times daily as directed. (FOR ICD-10 E10.9, E11.9)., Disp: 1 each, Rfl: 0 .  cephALEXin (KEFLEX) 500 MG capsule, Take 1 capsule (500 mg total) by mouth 2 (two) times  daily., Disp: 14 capsule, Rfl: 0 .  clonazePAM (KLONOPIN) 0.5 MG tablet, Take 0.5 tablets (0.25 mg total) by mouth at bedtime. For anxiety, Disp: 30 tablet, Rfl: 5 .  diclofenac (VOLTAREN) 75 MG EC tablet, Take 1 tablet (75 mg total) by mouth 2 (two) times daily., Disp: 90 tablet, Rfl: 2 .  metFORMIN (GLUCOPHAGE) 1000 MG tablet, Take 1 tablet (1,000 mg total) by mouth 2 (two) times daily., Disp: 180 tablet, Rfl: 1 .  metFORMIN (GLUCOPHAGE) 850 MG tablet, Take 1 tablet (850 mg total) by mouth 2 (two) times daily with a meal., Disp: 180 tablet, Rfl: 2  Allergies  Allergen Reactions  . Adhesive [Tape] Dermatitis   Past Medical History:  Diagnosis Date  . Abscess    hydroadenitis superativa  . Anxiety   . Hyperinsulinemia   . Neuroma    audiotry neuroma  . Vertigo     Observations/Objective: A&O  No respiratory distress or wheezing audible over the phone Mood, judgement, and thought processes all WNL  Assessment and Plan: 1. Upper respiratory tract infection, unspecified type - Discussed symptom management and symptoms that should prompt patient to go directly to the ER.   2. Cough - Novel Coronavirus, NAA ([facility]); Future  3. Shortness of breath - albuterol (VENTOLIN HFA) 108 (90 Base) MCG/ACT inhaler; Inhale 2 puffs into the lungs every 6 (six) hours as needed.  Dispense: 18 g; Refill: 2 - Novel Coronavirus, NAA ([facility]); Future  4. Nausea without vomiting - ondansetron (ZOFRAN ODT) 4 MG disintegrating tablet; Take 1 tablet (4 mg total)  by mouth every 8 (eight) hours as needed for nausea or vomiting.  Dispense: 20 tablet; Refill: 0 - Novel Coronavirus, NAA ([facility]); Future   Follow Up Instructions:  I discussed the assessment and treatment plan with the patient. The patient was provided an opportunity to ask questions and all were answered. The patient agreed with the plan and demonstrated an understanding of the instructions.   The patient was advised to call back or  seek an in-person evaluation if the symptoms worsen or if the condition fails to improve as anticipated.  The above assessment and management plan was discussed with the patient. The patient verbalized understanding of and has agreed to the management plan. Patient is aware to call the clinic if symptoms persist or worsen. Patient is aware when to return to the clinic for a follow-up visit. Patient educated on when it is appropriate to go to the emergency department.   Time call ended: 2:42 PM  I provided 13 minutes of non-face-to-face time during this encounter.  Hendricks Limes, MSN, APRN, FNP-C [facility] 12/03/19

## 2019-12-06 LAB — NOVEL CORONAVIRUS, NAA: SARS-CoV-2, NAA: NOT DETECTED

## 2019-12-20 ENCOUNTER — Encounter: Payer: BC Managed Care – PPO | Admitting: Oncology

## 2020-06-13 ENCOUNTER — Other Ambulatory Visit: Payer: Self-pay | Admitting: Family Medicine

## 2020-06-13 DIAGNOSIS — E1169 Type 2 diabetes mellitus with other specified complication: Secondary | ICD-10-CM

## 2020-06-13 DIAGNOSIS — D72829 Elevated white blood cell count, unspecified: Secondary | ICD-10-CM

## 2020-07-05 ENCOUNTER — Other Ambulatory Visit: Payer: Self-pay

## 2020-07-05 ENCOUNTER — Other Ambulatory Visit: Payer: BC Managed Care – PPO

## 2020-07-05 DIAGNOSIS — E785 Hyperlipidemia, unspecified: Secondary | ICD-10-CM

## 2020-07-05 DIAGNOSIS — D72829 Elevated white blood cell count, unspecified: Secondary | ICD-10-CM

## 2020-07-05 DIAGNOSIS — E1169 Type 2 diabetes mellitus with other specified complication: Secondary | ICD-10-CM

## 2020-07-05 LAB — CBC WITH DIFFERENTIAL/PLATELET
Basophils Absolute: 0.1 10*3/uL (ref 0.0–0.2)
Basos: 1 %
EOS (ABSOLUTE): 0.3 10*3/uL (ref 0.0–0.4)
Eos: 3 %
Hematocrit: 51.2 % — ABNORMAL HIGH (ref 34.0–46.6)
Hemoglobin: 16.7 g/dL — ABNORMAL HIGH (ref 11.1–15.9)
Immature Grans (Abs): 0 10*3/uL (ref 0.0–0.1)
Immature Granulocytes: 0 %
Lymphocytes Absolute: 2.6 10*3/uL (ref 0.7–3.1)
Lymphs: 23 %
MCH: 28.4 pg (ref 26.6–33.0)
MCHC: 32.6 g/dL (ref 31.5–35.7)
MCV: 87 fL (ref 79–97)
Monocytes Absolute: 0.6 10*3/uL (ref 0.1–0.9)
Monocytes: 6 %
Neutrophils Absolute: 7.5 10*3/uL — ABNORMAL HIGH (ref 1.4–7.0)
Neutrophils: 67 %
Platelets: 300 10*3/uL (ref 150–450)
RBC: 5.88 x10E6/uL — ABNORMAL HIGH (ref 3.77–5.28)
RDW: 13.2 % (ref 11.7–15.4)
WBC: 11.2 10*3/uL — ABNORMAL HIGH (ref 3.4–10.8)

## 2020-07-05 LAB — BAYER DCA HB A1C WAIVED: HB A1C (BAYER DCA - WAIVED): 7.5 % — ABNORMAL HIGH (ref <7.0)

## 2020-07-06 ENCOUNTER — Other Ambulatory Visit: Payer: Self-pay | Admitting: Family

## 2020-07-06 LAB — LIPID PANEL
Chol/HDL Ratio: 6.4 ratio — ABNORMAL HIGH (ref 0.0–4.4)
Cholesterol, Total: 204 mg/dL — ABNORMAL HIGH (ref 100–199)
HDL: 32 mg/dL — ABNORMAL LOW (ref >39)
LDL Chol Calc (NIH): 144 mg/dL — ABNORMAL HIGH (ref 0–99)
Triglycerides: 156 mg/dL — ABNORMAL HIGH (ref 0–149)
VLDL Cholesterol Cal: 28 mg/dL (ref 5–40)

## 2020-07-06 LAB — CMP14+EGFR
ALT: 46 IU/L — ABNORMAL HIGH (ref 0–32)
AST: 34 IU/L (ref 0–40)
Albumin/Globulin Ratio: 1.4 (ref 1.2–2.2)
Albumin: 3.8 g/dL (ref 3.8–4.9)
Alkaline Phosphatase: 93 IU/L (ref 44–121)
BUN/Creatinine Ratio: 13 (ref 12–28)
BUN: 12 mg/dL (ref 8–27)
Bilirubin Total: 0.4 mg/dL (ref 0.0–1.2)
CO2: 24 mmol/L (ref 20–29)
Calcium: 9.6 mg/dL (ref 8.7–10.3)
Chloride: 100 mmol/L (ref 96–106)
Creatinine, Ser: 0.96 mg/dL (ref 0.57–1.00)
Globulin, Total: 2.7 g/dL (ref 1.5–4.5)
Glucose: 225 mg/dL — ABNORMAL HIGH (ref 65–99)
Potassium: 4.9 mmol/L (ref 3.5–5.2)
Sodium: 140 mmol/L (ref 134–144)
Total Protein: 6.5 g/dL (ref 6.0–8.5)
eGFR: 68 mL/min/{1.73_m2} (ref >59)

## 2020-07-10 ENCOUNTER — Other Ambulatory Visit: Payer: Self-pay

## 2020-07-10 ENCOUNTER — Encounter: Payer: Self-pay | Admitting: Family

## 2020-07-10 ENCOUNTER — Ambulatory Visit: Payer: BC Managed Care – PPO | Admitting: Family

## 2020-07-10 DIAGNOSIS — E1169 Type 2 diabetes mellitus with other specified complication: Secondary | ICD-10-CM

## 2020-07-10 DIAGNOSIS — L732 Hidradenitis suppurativa: Secondary | ICD-10-CM

## 2020-07-10 DIAGNOSIS — M791 Myalgia, unspecified site: Secondary | ICD-10-CM

## 2020-07-10 DIAGNOSIS — F1320 Sedative, hypnotic or anxiolytic dependence, uncomplicated: Secondary | ICD-10-CM | POA: Diagnosis not present

## 2020-07-10 DIAGNOSIS — F411 Generalized anxiety disorder: Secondary | ICD-10-CM

## 2020-07-10 DIAGNOSIS — E785 Hyperlipidemia, unspecified: Secondary | ICD-10-CM

## 2020-07-10 DIAGNOSIS — E161 Other hypoglycemia: Secondary | ICD-10-CM

## 2020-07-10 DIAGNOSIS — Z79899 Other long term (current) drug therapy: Secondary | ICD-10-CM | POA: Diagnosis not present

## 2020-07-10 DIAGNOSIS — F17200 Nicotine dependence, unspecified, uncomplicated: Secondary | ICD-10-CM

## 2020-07-10 DIAGNOSIS — Z5320 Procedure and treatment not carried out because of patient's decision for unspecified reasons: Secondary | ICD-10-CM

## 2020-07-10 DIAGNOSIS — Z5329 Procedure and treatment not carried out because of patient's decision for other reasons: Secondary | ICD-10-CM

## 2020-07-10 DIAGNOSIS — Z6841 Body Mass Index (BMI) 40.0 and over, adult: Secondary | ICD-10-CM

## 2020-07-10 DIAGNOSIS — T466X5A Adverse effect of antihyperlipidemic and antiarteriosclerotic drugs, initial encounter: Secondary | ICD-10-CM

## 2020-07-10 MED ORDER — CLONAZEPAM 0.5 MG PO TABS: 0.2500 mg | ORAL_TABLET | Freq: Every day | ORAL | 5 refills | Status: DC

## 2020-07-10 MED ORDER — CEPHALEXIN 500 MG PO CAPS: 500.0000 mg | ORAL_CAPSULE | Freq: Two times a day (BID) | ORAL | 2 refills | Status: DC

## 2020-07-10 MED ORDER — METFORMIN HCL 1000 MG PO TABS: 1000.0000 mg | ORAL_TABLET | Freq: Two times a day (BID) | ORAL | 1 refills | Status: DC

## 2020-07-10 NOTE — Progress Notes (Signed)
Virtual Visit  Note Due to COVID-19 pandemic this visit was conducted virtually. This visit type was conducted due to national recommendations for restrictions regarding the COVID-19 Pandemic (e.g. social distancing, sheltering in place) in an effort to limit this patient's exposure and mitigate transmission in our community. All issues noted in this document were discussed and addressed.  A physical exam was not performed with this format.  I connected with Carolyn Hooper on 07/10/20 at 8:20 AM  by telephone and verified that I am speaking with the correct person using two identifiers. Carolyn Hooper is currently located at home and no one is currently with her during visit. The provider, Evelina Dun, FNP is located in their office at time of visit.  I discussed the limitations, risks, security and privacy concerns of performing an evaluation and management service by telephone and the availability of in person appointments. I also discussed with the patient that there may be a patient responsible charge related to this service. The patient expressed understanding and agreed to proceed.   History and Present Illness:  Pt presents to the office today for chronic follow up. Pt has hidradenitis suppurative and takes Keflex as needed. Diabetes She has type 2 diabetes mellitus. Hypoglycemia symptoms include nervousness/anxiousness. Pertinent negatives for diabetes include no blurred vision and no foot paresthesias. Symptoms are stable. Pertinent negatives for diabetic complications include no CVA or heart disease. Risk factors for coronary artery disease include dyslipidemia, diabetes mellitus, hypertension, sedentary lifestyle and post-menopausal. She is following a generally unhealthy diet. (Does not check BS at home) An ACE inhibitor/angiotensin II receptor blocker is not being taken. Eye exam is not current.  Hyperlipidemia This is a chronic problem. The current episode started more than 1 year  ago. The problem is uncontrolled. Recent lipid tests were reviewed and are high. Exacerbating diseases include obesity. Current antihyperlipidemic treatment includes statins. The current treatment provides moderate improvement of lipids. Risk factors for coronary artery disease include dyslipidemia, diabetes mellitus, hypertension, a sedentary lifestyle and post-menopausal.  Anxiety Presents for follow-up visit. Symptoms include depressed mood, excessive worry, irritability, nervous/anxious behavior and restlessness. Symptoms occur most days. The severity of symptoms is moderate. The quality of sleep is good.    Nicotine Dependence Presents for follow-up visit. Symptoms include irritability. Her urge triggers include company of smokers. The symptoms have been stable. She smokes 1 pack of cigarettes per day.      Review of Systems  Constitutional: Positive for irritability.  Eyes: Negative for blurred vision.  Psychiatric/Behavioral: The patient is nervous/anxious.   All other systems reviewed and are negative.    Observations/Objective: No SOB or distress noted.   Assessment and Plan: 1. Hyperinsulinemia  2. Hyperlipidemia associated with type 2 diabetes mellitus (HCC)  3. Benzodiazepine dependence (HCC) - clonazePAM (KLONOPIN) 0.5 MG tablet; Take 0.5 tablets (0.25 mg total) by mouth at bedtime. For anxiety  Dispense: 30 tablet; Refill: 5  4. Controlled substance agreement signed - clonazePAM (KLONOPIN) 0.5 MG tablet; Take 0.5 tablets (0.25 mg total) by mouth at bedtime. For anxiety  Dispense: 30 tablet; Refill: 5  5. Current smoker  6. Generalized anxiety disorde - clonazePAM (KLONOPIN) 0.5 MG tablet; Take 0.5 tablets (0.25 mg total) by mouth at bedtime. For anxiety  Dispense: 30 tablet; Refill: 5  7. Morbid obesity with BMI of 40.0-44.9, adult ([place])  8. Type 2 diabetes mellitus with other specified complication, without long-term current use of insulin (HCC) - metFORMIN  (GLUCOPHAGE) 1000 MG tablet; Take  1 tablet (1,000 mg total) by mouth 2 (two) times daily.  Dispense: 180 tablet; Refill: 1  9. Refusal of statin medication by patient  10. Myalgia due to statin   11. Hidradenitis suppurativa - cephALEXin (KEFLEX) 500 MG capsule; Take 1 capsule (500 mg total) by mouth 2 (two) times daily.  Dispense: 14 capsule; Refill: 2  Labs reviewed Encouraged Health Maintenance, she just started a new job and reports she will try to get these but will just have to take time.  Patient reviewed in [place] controlled database, no flags noted. Contract and drug screen are up to date.  RTO in 6 months    I discussed the assessment and treatment plan with the patient. The patient was provided an opportunity to ask questions and all were answered. The patient agreed with the plan and demonstrated an understanding of the instructions.   The patient was advised to call back or seek an in-person evaluation if the symptoms worsen or if the condition fails to improve as anticipated.  The above assessment and management plan was discussed with the patient. The patient verbalized understanding of and has agreed to the management plan. Patient is aware to call the clinic if symptoms persist or worsen. Patient is aware when to return to the clinic for a follow-up visit. Patient educated on when it is appropriate to go to the emergency department.   Time call ended:  8:43 AM   I provided 23 minutes of  non face-to-face time during this encounter.    Evelina Dun, FNP

## 2020-07-18 ENCOUNTER — Encounter: Payer: Self-pay | Admitting: Family Medicine

## 2020-07-24 ENCOUNTER — Encounter: Payer: Self-pay | Admitting: Family Medicine

## 2020-07-31 ENCOUNTER — Encounter: Payer: Self-pay | Admitting: Family Medicine

## 2020-08-30 ENCOUNTER — Ambulatory Visit: Payer: BC Managed Care – PPO | Admitting: Family Medicine

## 2020-08-30 ENCOUNTER — Other Ambulatory Visit: Payer: Self-pay

## 2020-08-30 ENCOUNTER — Encounter: Payer: Self-pay | Admitting: Family Medicine

## 2020-08-30 VITALS — BP 125/70 | HR 74 | Temp 98.1°F | Ht 68.0 in | Wt 273.5 lb

## 2020-08-30 DIAGNOSIS — N611 Abscess of the breast and nipple: Secondary | ICD-10-CM | POA: Diagnosis not present

## 2020-08-30 MED ORDER — DOXYCYCLINE HYCLATE 100 MG PO TABS: 100.0000 mg | ORAL_TABLET | Freq: Two times a day (BID) | ORAL | 0 refills | Status: AC

## 2020-08-30 NOTE — Patient Instructions (Signed)
Skin Abscess  A skin abscess is an infected area on or under your skin that contains a collection of pus and other material. An abscess may also be called a furuncle,carbuncle, or boil. An abscess can occur in or on almost any part of your body. Some abscesses break open (rupture) on their own. Most continue to get worse unless they are treated. The infection can spread deeper into the body and eventually into your blood, whichcan make you feel ill. Treatment usually involves draining the abscess. What are the causes? An abscess occurs when germs, like bacteria, pass through your skin and cause an infection. This may be caused by: A scrape or cut on your skin. A puncture wound through your skin, including a needle injection or insect bite. Blocked oil or sweat glands. Blocked and infected hair follicles. A cyst that forms beneath your skin (sebaceous cyst) and becomes infected. What increases the risk? This condition is more likely to develop in people who: Have a weak body defense system (immune system). Have diabetes. Have dry and irritated skin. Get frequent injections or use illegal IV drugs. Have a foreign body in a wound, such as a splinter. Have problems with their lymph system or veins. What are the signs or symptoms? Symptoms of this condition include: A painful, firm bump under the skin. A bump with pus at the top. This may break through the skin and drain. Other symptoms include: Redness surrounding the abscess site. Warmth. Swelling of the lymph nodes (glands) near the abscess. Tenderness. A sore on the skin. How is this diagnosed? This condition may be diagnosed based on: A physical exam. Your medical history. A sample of pus. This may be used to find out what is causing the infection. Blood tests. Imaging tests, such as an ultrasound, CT scan, or MRI. How is this treated? A small abscess that drains on its own may not need treatment. Treatment for larger abscesses  may include: Moist heat or heat pack applied to the area several times a day. A procedure to drain the abscess (incision and drainage). Antibiotic medicines. For a severe abscess, you may first get antibiotics through an IV and then change to antibiotics by mouth. Follow these instructions at home: Medicines  Take over-the-counter and prescription medicines only as told by your health care provider. If you were prescribed an antibiotic medicine, take it as told by your health care provider. Do not stop taking the antibiotic even if you start to feel better.  Abscess care  If you have an abscess that has not drained, apply heat to the affected area. Use the heat source that your health care provider recommends, such as a moist heat pack or a heating pad. Place a towel between your skin and the heat source. Leave the heat on for 20-30 minutes. Remove the heat if your skin turns bright red. This is especially important if you are unable to feel pain, heat, or cold. You may have a greater risk of getting burned. Follow instructions from your health care provider about how to take care of your abscess. Make sure you: Cover the abscess with a bandage (dressing). Change your dressing or gauze as told by your health care provider. Wash your hands with soap and water before you change the dressing or gauze. If soap and water are not available, use hand sanitizer. Check your abscess every day for signs of a worsening infection. Check for: More redness, swelling, or pain. More fluid or blood. Warmth. More  pus or a bad smell.  General instructions To avoid spreading the infection: Do not share personal care items, towels, or hot tubs with others. Avoid making skin contact with other people. Keep all follow-up visits as told by your health care provider. This is important. Contact a health care provider if you have: More redness, swelling, or pain around your abscess. More fluid or blood coming  from your abscess. Warm skin around your abscess. More pus or a bad smell coming from your abscess. A fever. Muscle aches. Chills or a general ill feeling. Get help right away if you: Have severe pain. See red streaks on your skin spreading away from the abscess. Summary A skin abscess is an infected area on or under your skin that contains a collection of pus and other material. A small abscess that drains on its own may not need treatment. Treatment for larger abscesses may include having a procedure to drain the abscess and taking an antibiotic. This information is not intended to replace advice given to you by your health care provider. Make sure you discuss any questions you have with your healthcare provider. Document Revised: 06/25/2018 Document Reviewed: 04/17/2017 Elsevier Patient Education  2022 [employer].

## 2020-08-30 NOTE — Progress Notes (Signed)
Acute Office Visit  Subjective:    Patient ID: Carolyn Hooper, female    DOB: 09/05/1960, 60 y.o.   MRN: [id number]  Chief Complaint  Patient presents with   Cyst    HPI Patient is in today for a cyst under her left breast x 5 days. The area is very tender and painful. It is also warm. She has been taking Keflex BID for 3 days without improvement. She has a history of recurrent boils and has keflex on hand for this. She has tried a warm compress at night. She denies fever, chills, or drainage.   Past Medical History:  Diagnosis Date   Abscess    hydroadenitis superativa   Anxiety    Hyperinsulinemia    Neuroma    audiotry neuroma   Vertigo     Past Surgical History:  Procedure Laterality Date   ABDOMINAL HYSTERECTOMY  2006   remaining left ovary   SHOULDER SURGERY Right 1960s    Family History  Problem Relation Age of Onset   COPD Mother    Heart disease Father    Diabetes Father    Kidney disease Father    Arthritis Father        RA   Liver disease Father     Social History   Socioeconomic History   Marital status: Divorced    Spouse name: Not on file   Number of children: Not on file   Years of education: Not on file   Highest education level: Not on file  Occupational History   Not on file  Tobacco Use   Smoking status: Every Day    Packs/day: 0.75    Pack years: 0.00    Types: Cigarettes   Smokeless tobacco: Never  Vaping Use   Vaping Use: Never used  Substance and Sexual Activity   Alcohol use: Yes    Comment: very rare   Drug use: No   Sexual activity: Not on file  Other Topics Concern   Not on file  Social History Narrative   Not on file   Social Determinants of Health   Financial Resource Strain: Not on file  Food Insecurity: Not on file  Transportation Needs: Not on file  Physical Activity: Not on file  Stress: Not on file  Social Connections: Not on file  Intimate Partner Violence: Not on file    Outpatient Medications  Prior to Visit  Medication Sig Dispense Refill   albuterol (VENTOLIN HFA) 108 (90 Base) MCG/ACT inhaler Inhale 2 puffs into the lungs every 6 (six) hours as needed. 18 g 2   blood glucose meter kit and supplies Dispense based on patient and insurance preference. Use up to four times daily as directed. (FOR ICD-10 E10.9, E11.9). 1 each 0   cephALEXin (KEFLEX) 500 MG capsule Take 1 capsule (500 mg total) by mouth 2 (two) times daily. 14 capsule 2   clonazePAM (KLONOPIN) 0.5 MG tablet Take 0.5 tablets (0.25 mg total) by mouth at bedtime. For anxiety 30 tablet 5   metFORMIN (GLUCOPHAGE) 1000 MG tablet Take 1 tablet (1,000 mg total) by mouth 2 (two) times daily. 180 tablet 1   No facility-administered medications prior to visit.    Allergies  Allergen Reactions   Adhesive [Tape] Dermatitis    Review of Systems As per HPI.     Objective:    Physical Exam Vitals and nursing note reviewed.  Constitutional:      General: She is not in acute distress.  Appearance: She is not ill-appearing, toxic-appearing or diaphoretic.  Pulmonary:     Effort: Pulmonary effort is normal. No respiratory distress.  Skin:    Findings: Abscess (lower left quadrant of left breast: 2 in x 2 in with warmth and tenderness. Induration present. No fluctuance or drainage present. No nipple retraction or discharge present. No dimpling of skin. No mass palpated.) present.  Neurological:     General: No focal deficit present.     Mental Status: She is alert and oriented to person, place, and time.  Psychiatric:        Mood and Affect: Mood normal.        Behavior: Behavior normal.    BP 125/70   Pulse 74   Temp 98.1 F (36.7 C) (Oral)   Ht '5\' 8"'  (1.727 m)   Wt 273 lb 8 oz (124.1 kg)   BMI 41.59 kg/m  Wt Readings from Last 3 Encounters:  08/30/20 273 lb 8 oz (124.1 kg)  09/21/19 276 lb 6.4 oz (125.4 kg)  08/18/19 275 lb (124.7 kg)    Health Maintenance Due  Topic Date Due   Pneumococcal Vaccine 30-8  Years old (1 - PCV) Never done   OPHTHALMOLOGY EXAM  Never done   COLONOSCOPY (Pts 45-52yr Insurance coverage will need to be confirmed)  Never done   MAMMOGRAM  Never done   COVID-19 Vaccine (3 - Moderna risk series) 09/06/2019   URINE MICROALBUMIN  09/20/2020    There are no preventive care reminders to display for this patient.   Lab Results  Component Value Date   TSH 2.480 04/14/2017   Lab Results  Component Value Date   WBC 11.2 (H) 07/05/2020   HGB 16.7 (H) 07/05/2020   HCT 51.2 (H) 07/05/2020   MCV 87 07/05/2020   PLT 300 07/05/2020   Lab Results  Component Value Date   NA 140 07/05/2020   K 4.9 07/05/2020   CO2 24 07/05/2020   GLUCOSE 225 (H) 07/05/2020   BUN 12 07/05/2020   CREATININE 0.96 07/05/2020   BILITOT 0.4 07/05/2020   ALKPHOS 93 07/05/2020   AST 34 07/05/2020   ALT 46 (H) 07/05/2020   PROT 6.5 07/05/2020   ALBUMIN 3.8 07/05/2020   CALCIUM 9.6 07/05/2020   EGFR 68 07/05/2020   Lab Results  Component Value Date   CHOL 204 (H) 07/05/2020   Lab Results  Component Value Date   HDL 32 (L) 07/05/2020   Lab Results  Component Value Date   LDLCALC 144 (H) 07/05/2020   Lab Results  Component Value Date   TRIG 156 (H) 07/05/2020   Lab Results  Component Value Date   CHOLHDL 6.4 (H) 07/05/2020   Lab Results  Component Value Date   HGBA1C 7.5 (H) 07/05/2020       Assessment & Plan:   NMeghamwas seen today for cyst.  Diagnoses and all orders for this visit:  Left breast abscess D/c keflex and switch to doxycycline as below. Warm compresses. Handout given. Follow up in 1 weeks, sooner for worsening symptoms.  -     doxycycline (VIBRA-TABS) 100 MG tablet; Take 1 tablet (100 mg total) by mouth 2 (two) times daily for 14 days. 1 po bid   The patient indicates understanding of these issues and agrees with the plan.    TGwenlyn Perking FNP

## 2020-09-01 ENCOUNTER — Other Ambulatory Visit: Payer: Self-pay

## 2020-09-01 ENCOUNTER — Encounter: Payer: Self-pay | Admitting: Family Medicine

## 2020-09-01 ENCOUNTER — Ambulatory Visit: Payer: BC Managed Care – PPO | Admitting: Family Medicine

## 2020-09-01 VITALS — BP 138/76 | HR 69 | Temp 97.1°F | Ht 68.0 in | Wt 274.0 lb

## 2020-09-01 DIAGNOSIS — N611 Abscess of the breast and nipple: Secondary | ICD-10-CM

## 2020-09-01 NOTE — Patient Instructions (Signed)
Moist heat (uncooked rice in a clear sock) 15 minutes 4 times per day, followed by massage to promote drainage Keep clean with mild soap and water OR normal saline Continue Doxycycline  Ibuprofen 600-800 mg (3-4 tablets) every 6-8 hours as needed for pain

## 2020-09-01 NOTE — Progress Notes (Signed)
Assessment & Plan:  1. Left breast abscess Moist heat (uncooked rice in a clean sock) 15 minutes 4 times per day, followed by massage to promote drainage Keep clean with mild soap and water OR normal saline Continue Doxycycline  Ibuprofen 600-800 mg (3-4 tablets) every 6-8 hours as needed for pain   Follow up plan: Return if symptoms worsen or fail to improve.  Hendricks Limes, MSN, APRN, FNP-C [facility]  Subjective:   Patient ID: Carolyn Hooper, female    DOB: June 29, 1960, 60 y.o.   MRN: [id number]  HPI: Carolyn Hooper is a 60 y.o. female presenting on 09/01/2020 for left breast abcess  Patient is returning due to a left breast abscess. She was seen two days ago and prescribed Doxycycline, which she has been taking. She reports the abscess started draining last night around 2:30 AM. She is concerned because she cannot see the area and wants to make sure everything is okay. The abscess has decreased in size significantly per her report. She was having terrible pain yesterday all day. Today she reports the pain is intermittent. Pain is worse when sitting or standing and pressure is down with gravity.   ROS: Negative unless specifically indicated above in HPI.   Relevant past medical history reviewed and updated as indicated.   Allergies and medications reviewed and updated.   Current Outpatient Medications:    albuterol (VENTOLIN HFA) 108 (90 Base) MCG/ACT inhaler, Inhale 2 puffs into the lungs every 6 (six) hours as needed., Disp: 18 g, Rfl: 2   blood glucose meter kit and supplies, Dispense based on patient and insurance preference. Use up to four times daily as directed. (FOR ICD-10 E10.9, E11.9)., Disp: 1 each, Rfl: 0   clonazePAM (KLONOPIN) 0.5 MG tablet, Take 0.5 tablets (0.25 mg total) by mouth at bedtime. For anxiety, Disp: 30 tablet, Rfl: 5   doxycycline (VIBRA-TABS) 100 MG tablet, Take 1 tablet (100 mg total) by mouth 2 (two) times daily for 14 days.  1 po bid, Disp: 28 tablet, Rfl: 0   metFORMIN (GLUCOPHAGE) 1000 MG tablet, Take 1 tablet (1,000 mg total) by mouth 2 (two) times daily., Disp: 180 tablet, Rfl: 1  Allergies  Allergen Reactions   Adhesive [Tape] Dermatitis    Objective:   BP 138/76   Pulse 69   Temp (!) 97.1 F (36.2 C) (Temporal)   Ht '5\' 8"'  (1.727 m)   Wt 274 lb (124.3 kg)   SpO2 95%   BMI 41.66 kg/m    Physical Exam Vitals reviewed.  Constitutional:      General: She is not in acute distress.    Appearance: Normal appearance. She is not ill-appearing, toxic-appearing or diaphoretic.  HENT:     Head: Normocephalic and atraumatic.  Eyes:     General: No scleral icterus.       Right eye: No discharge.        Left eye: No discharge.     Conjunctiva/sclera: Conjunctivae normal.  Cardiovascular:     Rate and Rhythm: Normal rate.  Pulmonary:     Effort: Pulmonary effort is normal. No respiratory distress.  Musculoskeletal:        General: Normal range of motion.     Cervical back: Normal range of motion.  Skin:    General: Skin is warm and dry.     Capillary Refill: Capillary refill takes less than 2 seconds.     Findings: Abscess (under left breast - very tender, mild  erythema surrounding) present.  Neurological:     General: No focal deficit present.     Mental Status: She is alert and oriented to person, place, and time. Mental status is at baseline.  Psychiatric:        Mood and Affect: Mood normal.        Behavior: Behavior normal.        Thought Content: Thought content normal.        Judgment: Judgment normal.

## 2020-09-15 ENCOUNTER — Ambulatory Visit: Payer: BC Managed Care – PPO | Admitting: Family Medicine

## 2021-01-03 ENCOUNTER — Telehealth: Payer: Self-pay | Admitting: Family

## 2021-01-04 NOTE — Telephone Encounter (Signed)
Patient aware  Patient wanted to verify that you will not order referral until she is seen?  Please advise

## 2021-01-04 NOTE — Telephone Encounter (Signed)
Please make her an appointment so we can discuss at appointment.

## 2021-01-04 NOTE — Telephone Encounter (Signed)
Pt needs to be seen in person as she is due for chronic follow up.

## 2021-01-04 NOTE — Telephone Encounter (Signed)
Is she having any problems? Such as changes in vision, gait, speech? If not we can just order a scan and continue to monitor it. We can place a referral if changes occur.

## 2021-01-04 NOTE — Telephone Encounter (Signed)
Patient aware and verbalized understanding. Patient would like to go ahead and get scan she has had no symptoms. Please set up referral patient aware they will call her with an appointment.

## 2021-01-04 NOTE — Telephone Encounter (Signed)
Can it be by phone?

## 2021-01-05 NOTE — Telephone Encounter (Signed)
Yes, the Neurologists will not accept referral without seeing office notes that we discussed this first. I am sorry.   Evelina Dun, FNP

## 2021-01-05 NOTE — Telephone Encounter (Signed)
Patient aware and verbalized understanding. °

## 2021-01-08 ENCOUNTER — Encounter: Payer: Self-pay | Admitting: Family

## 2021-01-08 ENCOUNTER — Other Ambulatory Visit: Payer: Self-pay

## 2021-01-08 ENCOUNTER — Ambulatory Visit: Payer: BC Managed Care – PPO | Admitting: Family

## 2021-01-08 VITALS — BP 137/78 | HR 54 | Temp 96.8°F | Ht 68.0 in | Wt 273.2 lb

## 2021-01-08 DIAGNOSIS — F411 Generalized anxiety disorder: Secondary | ICD-10-CM

## 2021-01-08 DIAGNOSIS — E1169 Type 2 diabetes mellitus with other specified complication: Secondary | ICD-10-CM

## 2021-01-08 DIAGNOSIS — Z6841 Body Mass Index (BMI) 40.0 and over, adult: Secondary | ICD-10-CM

## 2021-01-08 DIAGNOSIS — Z5320 Procedure and treatment not carried out because of patient's decision for unspecified reasons: Secondary | ICD-10-CM

## 2021-01-08 DIAGNOSIS — Z79899 Other long term (current) drug therapy: Secondary | ICD-10-CM

## 2021-01-08 DIAGNOSIS — E161 Other hypoglycemia: Secondary | ICD-10-CM

## 2021-01-08 DIAGNOSIS — L732 Hidradenitis suppurativa: Secondary | ICD-10-CM

## 2021-01-08 DIAGNOSIS — F17200 Nicotine dependence, unspecified, uncomplicated: Secondary | ICD-10-CM

## 2021-01-08 DIAGNOSIS — E785 Hyperlipidemia, unspecified: Secondary | ICD-10-CM

## 2021-01-08 DIAGNOSIS — F1320 Sedative, hypnotic or anxiolytic dependence, uncomplicated: Secondary | ICD-10-CM

## 2021-01-08 DIAGNOSIS — D333 Benign neoplasm of cranial nerves: Secondary | ICD-10-CM

## 2021-01-08 DIAGNOSIS — R0602 Shortness of breath: Secondary | ICD-10-CM | POA: Diagnosis not present

## 2021-01-08 MED ORDER — METFORMIN HCL 1000 MG PO TABS: 1000.0000 mg | ORAL_TABLET | Freq: Two times a day (BID) | ORAL | 1 refills | Status: DC

## 2021-01-08 MED ORDER — CLONAZEPAM 0.5 MG PO TABS: 0.2500 mg | ORAL_TABLET | Freq: Every day | ORAL | 5 refills | Status: DC

## 2021-01-08 MED ORDER — CEPHALEXIN 500 MG PO CAPS: 500.0000 mg | ORAL_CAPSULE | Freq: Two times a day (BID) | ORAL | 1 refills | Status: DC

## 2021-01-08 MED ORDER — ALBUTEROL SULFATE HFA 108 (90 BASE) MCG/ACT IN AERS: 2.0000 | INHALATION_SPRAY | Freq: Four times a day (QID) | RESPIRATORY_TRACT | 2 refills | Status: DC | PRN

## 2021-01-08 NOTE — Progress Notes (Signed)
Subjective:    Patient ID: Carolyn Hooper, female    DOB: 03/08/1961, 60 y.o.   MRN: [id number]  Chief Complaint  Patient presents with   Medical Management of Chronic Issues    Wants referral MRI for brain tumor    Pt presents to the office today for chronic follow up. Pt has hidradenitis suppurative and takes Keflex as needed.  She reports in 2013 she was diagnosed with a right IAC vestibular schwannoma, [id number]. She reports she has never had another MRI since then or followed up with a specialists. She reports last week she had a stabbing pain on her right head of 8 out 10. It lasted 15-20 seconds and resolved.  Diabetes She presents for her follow-up diabetic visit. She has type 2 diabetes mellitus. Hypoglycemia symptoms include nervousness/anxiousness. Pertinent negatives for diabetes include no blurred vision and no foot paresthesias. Symptoms are stable. Risk factors for coronary artery disease include dyslipidemia, diabetes mellitus, hypertension and sedentary lifestyle. She is following a generally unhealthy diet. (Does not check BS at home)  Hyperlipidemia This is a chronic problem. The current episode started more than 1 year ago. Exacerbating diseases include obesity. Associated symptoms include myalgias. Current antihyperlipidemic treatment includes diet change. The current treatment provides no improvement of lipids. Risk factors for coronary artery disease include dyslipidemia, hypertension, a sedentary lifestyle, diabetes mellitus and post-menopausal.  Anxiety Presents for follow-up visit. Symptoms include depressed mood, excessive worry, irritability, nervous/anxious behavior and restlessness. Symptoms occur most days. The severity of symptoms is moderate.    Nicotine Dependence Presents for follow-up visit. Symptoms include irritability. Her urge triggers include company of smokers. The symptoms have been stable. She smokes 1 pack of cigarettes per day.     Review of  Systems  Constitutional:  Positive for irritability.  Eyes:  Negative for blurred vision.  Musculoskeletal:  Positive for myalgias.  Psychiatric/Behavioral:  The patient is nervous/anxious.   All other systems reviewed and are negative.     Objective:   Physical Exam Vitals reviewed.  Constitutional:      General: She is not in acute distress.    Appearance: She is well-developed.  HENT:     Head: Normocephalic and atraumatic.     Right Ear: Tympanic membrane normal.     Left Ear: Tympanic membrane normal.  Eyes:     Pupils: Pupils are equal, round, and reactive to light.  Neck:     Thyroid: No thyromegaly.  Cardiovascular:     Rate and Rhythm: Normal rate and regular rhythm.     Heart sounds: Normal heart sounds. No murmur heard. Pulmonary:     Effort: Pulmonary effort is normal. No respiratory distress.     Breath sounds: Normal breath sounds. No wheezing.  Abdominal:     General: Bowel sounds are normal. There is no distension.     Palpations: Abdomen is soft.     Tenderness: There is no abdominal tenderness.  Musculoskeletal:        General: No tenderness. Normal range of motion.     Cervical back: Normal range of motion and neck supple.  Skin:    General: Skin is warm and dry.  Neurological:     Mental Status: She is alert and oriented to person, place, and time.     Cranial Nerves: No cranial nerve deficit.     Deep Tendon Reflexes: Reflexes are normal and symmetric.  Psychiatric:        Behavior: Behavior normal.  Thought Content: Thought content normal.        Judgment: Judgment normal.         BP 137/78   Pulse (!) 54   Temp (!) 96.8 F (36 C) (Temporal)   Ht _0  (1.727 m)   Wt 273 lb 3.2 oz (123.9 kg)   BMI 41.54 kg/m   Assessment & Plan:  SUSETTE SEMINARA comes in today with chief complaint of Medical Management of Chronic Issues (Wants referral MRI for brain tumor )   Diagnosis and orders addressed:  1. Benzodiazepine dependence  (HCC) - clonazePAM (KLONOPIN) 0.5 MG tablet; Take 0.5 tablets (0.25 mg total) by mouth at bedtime. For anxiety  Dispense: 30 tablet; Refill: 5 - CMP14+EGFR - CBC with Differential/Platelet  2. Controlled substance agreement signed - clonazePAM (KLONOPIN) 0.5 MG tablet; Take 0.5 tablets (0.25 mg total) by mouth at bedtime. For anxiety  Dispense: 30 tablet; Refill: 5 - CMP14+EGFR - CBC with Differential/Platelet  3. Generalized anxiety disorder - clonazePAM (KLONOPIN) 0.5 MG tablet; Take 0.5 tablets (0.25 mg total) by mouth at bedtime. For anxiety  Dispense: 30 tablet; Refill: 5 - CMP14+EGFR - CBC with Differential/Platelet  4. Shortness of breath - albuterol (VENTOLIN HFA) 108 (90 Base) MCG/ACT inhaler; Inhale 2 puffs into the lungs every 6 (six) hours as needed.  Dispense: 18 g; Refill: 2 - CMP14+EGFR - CBC with Differential/Platelet  5. Type 2 diabetes mellitus with other specified complication, without long-term current use of insulin (HCC) - metFORMIN (GLUCOPHAGE) 1000 MG tablet; Take 1 tablet (1,000 mg total) by mouth 2 (two) times daily.  Dispense: 180 tablet; Refill: 1 - CMP14+EGFR - CBC with Differential/Platelet - Bayer DCA Hb A1c Waived - Microalbumin / creatinine urine ratio  6. Hyperinsulinemia - CMP14+EGFR - CBC with Differential/Platelet  7. Hyperlipidemia associated with type 2 diabetes mellitus (HCC) - CMP14+EGFR - CBC with Differential/Platelet  8. Hidradenitis suppurativa - CMP14+EGFR - CBC with Differential/Platelet  9. Morbid obesity with BMI of 40.0-44.9, adult (HCC) - CMP14+EGFR - CBC with Differential/Platelet  10. Current smoker - CMP14+EGFR - CBC with Differential/Platelet  11. Refusal of statin medication by patient - CMP14+EGFR - CBC with Differential/Platelet  12. Unilateral vestibular schwannoma (HCC) - CMP14+EGFR - CBC with Differential/Platelet - MR Brain W Wo Contrast; Future   Labs pending Health Maintenance reviewed Diet and  exercise encouraged  Follow up plan: 6 months    Evelina Dun, FNP

## 2021-01-08 NOTE — Patient Instructions (Signed)
Acoustic Neuroma, Adult Acoustic neuroma, also called vestibular schwannoma, is a growth (tumor) on a nerve in the inner ear. It is not cancerous (is benign). The tumor grows on the eighth cranial nerve. This nerve passes between the brain and the inner ear through a bony tunnel (internal auditory canal). The eighth cranial nerve is important for hearing and balance. As the tumor gets larger, it has less room to grow inside of the canal. It starts to press on the nerve and can cause problems with hearing or balance. Nerves in the face may also be affected. Acoustic neuroma usually only happens on one side of the body. What are the causes? This condition is caused by an overgrowth of the cells (Schwann cells) that make up the outer layer of the eighth cranial nerve. The cause of this overgrowth is not known. What increases the risk? This condition is more likely to develop in: People who have a family history of neurofibromatosis. This is a condition that is passed along from parent to child (inherited). It causes benign tumors to grow on many nerves, including the eighth cranial nerve. Women. What are the signs or symptoms? If the tumor is large enough to cause symptoms, the most common symptom is hearing loss in one ear. Sometimes the hearing loss is sudden and can be permanent. Other symptoms may develop over time. Possible symptoms include: Ringing in the ear (tinnitus). Loss of balance or a sensation of spinning (vertigo). Headache. A large acoustic neuroma may start to affect other nerves in the face and throat. This can lead to facial weakness or numbness and tingling. You may also have difficulty swallowing. How is this diagnosed? This condition is diagnosed based on: Your symptoms and medical history. A physical exam. The exam may include tests to check your hearing and balance. Imaging tests of your inner ear or brain, such as an MRI, CT scan, or X-ray. These will be done to confirm the  diagnosis. How is this treated? Treatment for this condition depends on your symptoms, the size of the tumor, and your overall health. Treatment may include: Monitoring the tumor. Acoustic neuroma is a tumor that grows slowly. Treatment may not be needed unless the tumor is large and causes symptoms. Medicines, such as anticonvulsants or corticosteroids. Other medicines may be used to treat symptoms and prevent the tumor from growing. Radiation therapy to stop the tumor from growing. Surgery to remove all or part of the tumor. This may be done if you have symptoms or if your tumor is growing. Follow these instructions at home: Learn as much as you can about your condition and work closely with your team of health care providers. Take over-the-counter and prescription medicines only as told by your health care provider. Keep all follow-up visits as told by your health care provider. This is especially important if you have an acoustic neuroma that is being monitored. Contact a health care provider if you: Have any of the following symptoms that get worse: Tinnitus. Vertigo. Headache. Hearing loss. Have facial weakness, tingling, or numbness. Develop new symptoms. Have difficulty swallowing. Get help right away if you: Develop severe dizziness. Have difficulty standing or walking. Develop a severe headache. Pass out or lose consciousness. Have a seizure. Summary An acoustic neuroma is a benign tumor that grows on the eighth cranial nerve, which controls balance and hearing. These tumors can grow large enough that they can cause hearing loss, ringing in the ears, or balance problems. These tumors usually happen  only on one side. Treatment may not be needed unless the tumor is large and causes symptoms. In some cases, radiation or surgery may be needed to prevent problems with hearing and balance. This information is not intended to replace advice given to you by your health care provider.  Make sure you discuss any questions you have with your health care provider. Document Revised: 12/04/2017 Document Reviewed: 12/04/2017 Elsevier Patient Education  [place].

## 2021-01-09 LAB — CMP14+EGFR
ALT: 29 IU/L (ref 0–32)
AST: 22 IU/L (ref 0–40)
Albumin/Globulin Ratio: 1.7 (ref 1.2–2.2)
Albumin: 4.2 g/dL (ref 3.8–4.8)
Alkaline Phosphatase: 100 IU/L (ref 44–121)
BUN/Creatinine Ratio: 19 (ref 12–28)
BUN: 16 mg/dL (ref 8–27)
Bilirubin Total: 0.3 mg/dL (ref 0.0–1.2)
CO2: 26 mmol/L (ref 20–29)
Calcium: 9.9 mg/dL (ref 8.7–10.3)
Chloride: 99 mmol/L (ref 96–106)
Creatinine, Ser: 0.84 mg/dL (ref 0.57–1.00)
Globulin, Total: 2.5 g/dL (ref 1.5–4.5)
Glucose: 194 mg/dL — ABNORMAL HIGH (ref 70–99)
Potassium: 4.9 mmol/L (ref 3.5–5.2)
Sodium: 142 mmol/L (ref 134–144)
Total Protein: 6.7 g/dL (ref 6.0–8.5)
eGFR: 79 mL/min/{1.73_m2} (ref >59)

## 2021-01-09 LAB — MICROALBUMIN / CREATININE URINE RATIO
Creatinine, Urine: 95.6 mg/dL
Microalb/Creat Ratio: <3 mg/g creat (ref 0–29)
Microalbumin, Urine: <3 ug/mL

## 2021-01-09 LAB — CBC WITH DIFFERENTIAL/PLATELET
Basophils Absolute: 0.1 10*3/uL (ref 0.0–0.2)
Basos: 1 %
EOS (ABSOLUTE): 0.5 10*3/uL — ABNORMAL HIGH (ref 0.0–0.4)
Eos: 3 %
Hematocrit: 50.6 % — ABNORMAL HIGH (ref 34.0–46.6)
Hemoglobin: 16.1 g/dL — ABNORMAL HIGH (ref 11.1–15.9)
Immature Grans (Abs): 0.1 10*3/uL (ref 0.0–0.1)
Immature Granulocytes: 1 %
Lymphocytes Absolute: 4.1 10*3/uL — ABNORMAL HIGH (ref 0.7–3.1)
Lymphs: 29 %
MCH: 27.6 pg (ref 26.6–33.0)
MCHC: 31.8 g/dL (ref 31.5–35.7)
MCV: 87 fL (ref 79–97)
Monocytes Absolute: 0.8 10*3/uL (ref 0.1–0.9)
Monocytes: 6 %
Neutrophils Absolute: 8.7 10*3/uL — ABNORMAL HIGH (ref 1.4–7.0)
Neutrophils: 60 %
Platelets: 307 10*3/uL (ref 150–450)
RBC: 5.84 x10E6/uL — ABNORMAL HIGH (ref 3.77–5.28)
RDW: 13.4 % (ref 11.7–15.4)
WBC: 14.3 10*3/uL — ABNORMAL HIGH (ref 3.4–10.8)

## 2021-01-15 ENCOUNTER — Telehealth: Payer: Self-pay | Admitting: Family

## 2021-01-15 LAB — SPECIMEN STATUS REPORT

## 2021-01-15 NOTE — Telephone Encounter (Signed)
I have spoke with the lab - checking on A1C result from last Monday: they are calling [facility] to get added. Will update once we have the reading

## 2021-01-15 NOTE — Telephone Encounter (Signed)
Aware - will need to be re-drawn  Aware to monitor Heart rate for lows and report to [facility]

## 2021-01-15 NOTE — Telephone Encounter (Signed)
Pt called to get lab results. Reviewed results with pt per providers notes. Pt voiced understanding. Wants to know what her A1C was? Looks like it was collected but no result noted.  Pt also saw that on her AVS her pulse was low at her visit. Wants to know if she should be concerned about that? Should it be rechecked? Is it ok to wait until her next visit in 6 mths?  Please advise and call patient.

## 2021-01-25 ENCOUNTER — Other Ambulatory Visit: Payer: Self-pay

## 2021-01-25 ENCOUNTER — Ambulatory Visit (HOSPITAL_COMMUNITY)
Admission: RE | Admit: 2021-01-25 | Discharge: 2021-01-25 | Disposition: A | Discharge status: Home / Self Care | Payer: BC Managed Care – PPO | Source: Ambulatory Visit | Attending: Family | Admitting: Family

## 2021-01-25 DIAGNOSIS — D333 Benign neoplasm of cranial nerves: Secondary | ICD-10-CM | POA: Diagnosis not present

## 2021-01-25 DIAGNOSIS — D3615 Benign neoplasm of peripheral nerves and autonomic nervous system of abdomen: Secondary | ICD-10-CM | POA: Diagnosis not present

## 2021-01-25 MED ORDER — GADOBUTROL 1 MMOL/ML IV SOLN
10.0000 mL | Freq: Once | INTRAVENOUS | Status: AC | PRN
  Administered 2021-01-25: 10 mL via INTRAVENOUS

## 2021-01-29 ENCOUNTER — Telehealth: Payer: Self-pay | Admitting: Family

## 2021-01-29 NOTE — Telephone Encounter (Signed)
Pt aware by phone 

## 2021-10-13 DIAGNOSIS — Z888 Allergy status to other drugs, medicaments and biological substances status: Secondary | ICD-10-CM | POA: Diagnosis not present

## 2021-10-13 DIAGNOSIS — Z6841 Body Mass Index (BMI) 40.0 and over, adult: Secondary | ICD-10-CM | POA: Diagnosis not present

## 2021-10-13 DIAGNOSIS — Z91048 Other nonmedicinal substance allergy status: Secondary | ICD-10-CM | POA: Diagnosis not present

## 2021-10-13 DIAGNOSIS — L03115 Cellulitis of right lower limb: Secondary | ICD-10-CM | POA: Diagnosis not present

## 2021-10-13 DIAGNOSIS — E119 Type 2 diabetes mellitus without complications: Secondary | ICD-10-CM | POA: Diagnosis not present

## 2021-10-13 DIAGNOSIS — L03116 Cellulitis of left lower limb: Secondary | ICD-10-CM | POA: Diagnosis not present

## 2021-10-13 DIAGNOSIS — Z7984 Long term (current) use of oral hypoglycemic drugs: Secondary | ICD-10-CM | POA: Diagnosis not present

## 2021-10-13 DIAGNOSIS — F17210 Nicotine dependence, cigarettes, uncomplicated: Secondary | ICD-10-CM | POA: Diagnosis not present

## 2021-10-17 ENCOUNTER — Encounter: Payer: Self-pay | Admitting: Family Medicine

## 2021-10-17 ENCOUNTER — Ambulatory Visit: Payer: BC Managed Care – PPO | Admitting: Family Medicine

## 2021-10-17 VITALS — BP 105/71 | HR 80 | Temp 98.6°F | Ht 68.0 in | Wt 258.6 lb

## 2021-10-17 DIAGNOSIS — L03115 Cellulitis of right lower limb: Secondary | ICD-10-CM | POA: Diagnosis not present

## 2021-10-17 DIAGNOSIS — L03116 Cellulitis of left lower limb: Secondary | ICD-10-CM | POA: Diagnosis not present

## 2021-10-17 NOTE — Progress Notes (Signed)
Subjective:  Patient ID: Carolyn Hooper, female    DOB: [birth year], 61 y.o.   MRN: [id number]  Patient Care Team: [name], FNP as PCP - General (Nurse Practitioner) Danie Binder, MD (Inactive) as Consulting Physician (Gastroenterology)   Chief Complaint:  Cellulitis (Pt went to the ER on Saturday and was given medication - this is her first having it , states she has a lot of tenderness and fever in both of her legs)   HPI: Carolyn Hooper is a 61 y.o. female presenting on 10/17/2021 for Cellulitis (Pt went to the ER on Saturday and was given medication - this is her first having it , states she has a lot of tenderness and fever in both of her legs)   Pt presents today for reevaluation of bilateral lower extremity cellulitis. States this started about 3-4 weeks ago. She was seen in the ED on Saturday and given IV clindamycin and then sent home on oral clindamycin. She has been taking the medications as prescribed but reports the swelling and redness to her feet have slightly worsened. Denies pain but states legs are very pruritic. No fever, chills, weakness, confusion, or fatigue. She feels this all started due to flea bites to lower legs.      Relevant past medical, surgical, family, and social history reviewed and updated as indicated.  Allergies and medications reviewed and updated. Data reviewed: Chart in Epic.   Past Medical History:  Diagnosis Date   Abscess    hydroadenitis superativa   Anxiety    Hyperinsulinemia    Neuroma    audiotry neuroma   Vertigo     Past Surgical History:  Procedure Laterality Date   ABDOMINAL HYSTERECTOMY  2006   remaining left ovary   SHOULDER SURGERY Right 1960s    Social History   Socioeconomic History   Marital status: Divorced    Spouse name: Not on file   Number of children: Not on file   Years of education: Not on file   Highest education level: Not on file  Occupational History   Not on file  Tobacco Use    Smoking status: Every Day    Packs/day: 0.75    Types: Cigarettes   Smokeless tobacco: Never  Vaping Use   Vaping Use: Never used  Substance and Sexual Activity   Alcohol use: Yes    Comment: very rare   Drug use: No   Sexual activity: Not on file  Other Topics Concern   Not on file  Social History Narrative   Not on file   Social Determinants of Health   Financial Resource Strain: Not on file  Food Insecurity: Not on file  Transportation Needs: Not on file  Physical Activity: Not on file  Stress: Not on file  Social Connections: Not on file  Intimate Partner Violence: Not on file    Outpatient Encounter Medications as of 10/17/2021  Medication Sig   clindamycin (CLEOCIN) 300 MG capsule Take by mouth.   albuterol (VENTOLIN HFA) 108 (90 Base) MCG/ACT inhaler Inhale 2 puffs into the lungs every 6 (six) hours as needed.   blood glucose meter kit and supplies Dispense based on patient and insurance preference. Use up to four times daily as directed. (FOR ICD-10 E10.9, E11.9).   cephALEXin (KEFLEX) 500 MG capsule Take 1 capsule (500 mg total) by mouth 2 (two) times daily.   clindamycin (CLEOCIN) 300 MG capsule Take 300 mg by mouth every 6 (six)  hours.   clonazePAM (KLONOPIN) 0.5 MG tablet Take 0.5 tablets (0.25 mg total) by mouth at bedtime. For anxiety   metFORMIN (GLUCOPHAGE) 1000 MG tablet Take 1 tablet (1,000 mg total) by mouth 2 (two) times daily.   No facility-administered encounter medications on file as of 10/17/2021.    Allergies  Allergen Reactions   Adhesive [Tape] Dermatitis   Statins Other (See Comments)    Myalgia     Review of Systems  Constitutional:  Negative for activity change, appetite change, chills, diaphoresis, fatigue, fever and unexpected weight change.  HENT: Negative.    Eyes: Negative.   Respiratory:  Negative for cough, chest tightness and shortness of breath.   Cardiovascular:  Positive for leg swelling. Negative for chest pain and  palpitations.  Gastrointestinal:  Negative for abdominal pain, blood in stool, constipation, diarrhea, nausea and vomiting.  Endocrine: Negative.   Genitourinary:  Negative for decreased urine volume, difficulty urinating, dysuria, frequency and urgency.  Musculoskeletal:  Negative for arthralgias and myalgias.  Skin:  Positive for color change and rash. Negative for pallor and wound.  Allergic/Immunologic: Negative.   Neurological:  Negative for dizziness, tremors, seizures, syncope, facial asymmetry, speech difficulty, weakness, light-headedness, numbness and headaches.  Hematological: Negative.  Negative for adenopathy. Does not bruise/bleed easily.  Psychiatric/Behavioral:  Negative for confusion, hallucinations, sleep disturbance and suicidal ideas.   All other systems reviewed and are negative.       Objective:  BP 105/71   Pulse 80   Temp 98.6 F (37 C)   Ht $R'5\' 8"'Ma$  (1.727 m)   Wt 258 lb 9.6 oz (117.3 kg)   SpO2 93%   BMI 39.32 kg/m    Wt Readings from Last 3 Encounters:  10/17/21 258 lb 9.6 oz (117.3 kg)  01/08/21 273 lb 3.2 oz (123.9 kg)  09/01/20 274 lb (124.3 kg)    Physical Exam Vitals and nursing note reviewed.  Constitutional:      Appearance: Normal appearance. She is obese.  HENT:     Head: Normocephalic and atraumatic.     Mouth/Throat:     Mouth: Mucous membranes are moist.  Eyes:     Conjunctiva/sclera: Conjunctivae normal.     Pupils: Pupils are equal, round, and reactive to light.  Cardiovascular:     Rate and Rhythm: Normal rate and regular rhythm.     Heart sounds: Normal heart sounds. No murmur heard.    No friction rub. No gallop.  Pulmonary:     Effort: Pulmonary effort is normal.     Breath sounds: Normal breath sounds.  Musculoskeletal:     Right lower leg: 1+ Edema present.     Left lower leg: 1+ Edema present.  Skin:    General: Skin is warm and dry.     Capillary Refill: Capillary refill takes less than 2 seconds.     Findings:  Erythema present.     Comments: Cellulitis with folliculitis to bilateral lower legs, images below  Neurological:     General: No focal deficit present.     Mental Status: She is alert and oriented to person, place, and time.  Psychiatric:        Mood and Affect: Mood normal.        Behavior: Behavior normal.        Thought Content: Thought content normal.        Judgment: Judgment normal.      Results for orders placed or performed in visit on 01/08/21  CMP14+EGFR  Result Value Ref Range   Glucose 194 (H) 70 - 99 mg/dL   BUN 16 8 - 27 mg/dL   Creatinine, Ser 0.84 0.57 - 1.00 mg/dL   eGFR 79 >59 mL/min/1.73   BUN/Creatinine Ratio 19 12 - 28   Sodium 142 134 - 144 mmol/L   Potassium 4.9 3.5 - 5.2 mmol/L   Chloride 99 96 - 106 mmol/L   CO2 26 20 - 29 mmol/L   Calcium 9.9 8.7 - 10.3 mg/dL   Total Protein 6.7 6.0 - 8.5 g/dL   Albumin 4.2 3.8 - 4.8 g/dL   Globulin, Total 2.5 1.5 - 4.5 g/dL   Albumin/Globulin Ratio 1.7 1.2 - 2.2   Bilirubin Total 0.3 0.0 - 1.2 mg/dL   Alkaline Phosphatase 100 44 - 121 IU/L   AST 22 0 - 40 IU/L   ALT 29 0 - 32 IU/L  CBC with Differential/Platelet  Result Value Ref Range   WBC 14.3 (H) 3.4 - 10.8 x10E3/uL   RBC 5.84 (H) 3.77 - 5.28 x10E6/uL   Hemoglobin 16.1 (H) 11.1 - 15.9 g/dL   Hematocrit 50.6 (H) 34.0 - 46.6 %   MCV 87 79 - 97 fL   MCH 27.6 26.6 - 33.0 pg   MCHC 31.8 31.5 - 35.7 g/dL   RDW 13.4 11.7 - 15.4 %   Platelets 307 150 - 450 x10E3/uL   Neutrophils 60 Not Estab. %   Lymphs 29 Not Estab. %   Monocytes 6 Not Estab. %   Eos 3 Not Estab. %   Basos 1 Not Estab. %   Neutrophils Absolute 8.7 (H) 1.4 - 7.0 x10E3/uL   Lymphocytes Absolute 4.1 (H) 0.7 - 3.1 x10E3/uL   Monocytes Absolute 0.8 0.1 - 0.9 x10E3/uL   EOS (ABSOLUTE) 0.5 (H) 0.0 - 0.4 x10E3/uL   Basophils Absolute 0.1 0.0 - 0.2 x10E3/uL   Immature Granulocytes 1 Not Estab. %   Immature Grans (Abs) 0.1 0.0 - 0.1 x10E3/uL  Microalbumin / creatinine urine ratio  Result Value  Ref Range   Creatinine, Urine 95.6 Not Estab. mg/dL   Microalbumin, Urine <3.0 Not Estab. ug/mL   Microalb/Creat Ratio <3 0 - 29 mg/g creat  Specimen status report  Result Value Ref Range   specimen status report Comment        Pertinent labs & imaging results that were available during my care of the patient were reviewed by me and considered in my medical decision making.  Assessment & Plan:  Bexleigh was seen today for cellulitis.  Diagnoses and all orders for this visit:  Bilateral cellulitis of lower leg No indications of systemic infection. Reports swelling has worsened. Bilateral unna boots applied in office. Continue clindamycin. Return in 5-7 days for reevaluation and removal of unna boots.     Continue all other maintenance medications.  Follow up plan: Return in about 1 week (around 10/24/2021), or if symptoms worsen or fail to improve, for cellulitis.   Continue healthy lifestyle choices, including diet (rich in fruits, vegetables, and lean proteins, and low in salt and simple carbohydrates) and exercise (at least 30 minutes of moderate physical activity daily).  Educational handout given for cellulitis  The above assessment and management plan was discussed with the patient. The patient verbalized understanding of and has agreed to the management plan. Patient is aware to call the clinic if they develop any new symptoms or if symptoms persist or worsen. Patient is aware when to return to the clinic for a follow-up  visit. Patient educated on when it is appropriate to go to the emergency department.   Monia Pouch, FNP-C [facility] [phone]

## 2021-10-17 NOTE — Patient Instructions (Signed)
CeraVe or Cetaphil

## 2021-10-18 ENCOUNTER — Encounter: Payer: Self-pay | Admitting: Family Medicine

## 2021-10-18 ENCOUNTER — Ambulatory Visit (INDEPENDENT_AMBULATORY_CARE_PROVIDER_SITE_OTHER): Payer: BC Managed Care – PPO | Admitting: Family Medicine

## 2021-10-18 VITALS — BP 135/80 | HR 71 | Temp 98.4°F | Ht 68.0 in | Wt 258.0 lb

## 2021-10-18 DIAGNOSIS — Z5189 Encounter for other specified aftercare: Secondary | ICD-10-CM

## 2021-10-18 DIAGNOSIS — Z789 Other specified health status: Secondary | ICD-10-CM | POA: Diagnosis not present

## 2021-10-18 NOTE — Progress Notes (Signed)
Subjective:  Patient ID: Carolyn Hooper, female    DOB: [birth year], 61 y.o.   MRN: [id number]  Patient Care Team: [name], FNP as PCP - General (Nurse Practitioner) Danie Binder, MD (Inactive) as Consulting Physician (Gastroenterology)   Chief Complaint:  [name] boot check   HPI: Carolyn Hooper is a 61 y.o. female presenting on 10/18/2021 for unna boot check   Pt presents today for reevaluation of unna boots which were placed in office yesterday. She reports the ace wraps have come off and she has some itching under the wraps. No loss of sensation or function. No fever, chills, weakness, or confusion.     Relevant past medical, surgical, family, and social history reviewed and updated as indicated.  Allergies and medications reviewed and updated. Data reviewed: Chart in Epic.   Past Medical History:  Diagnosis Date   Abscess    hydroadenitis superativa   Anxiety    Hyperinsulinemia    Neuroma    audiotry neuroma   Vertigo     Past Surgical History:  Procedure Laterality Date   ABDOMINAL HYSTERECTOMY  2006   remaining left ovary   SHOULDER SURGERY Right 1960s    Social History   Socioeconomic History   Marital status: Divorced    Spouse name: Not on file   Number of children: Not on file   Years of education: Not on file   Highest education level: Not on file  Occupational History   Not on file  Tobacco Use   Smoking status: Every Day    Packs/day: 0.75    Types: Cigarettes   Smokeless tobacco: Never  Vaping Use   Vaping Use: Never used  Substance and Sexual Activity   Alcohol use: Yes    Comment: very rare   Drug use: No   Sexual activity: Not on file  Other Topics Concern   Not on file  Social History Narrative   Not on file   Social Determinants of Health   Financial Resource Strain: Not on file  Food Insecurity: Not on file  Transportation Needs: Not on file  Physical Activity: Not on file  Stress: Not on file  Social  Connections: Not on file  Intimate Partner Violence: Not on file    Outpatient Encounter Medications as of 10/18/2021  Medication Sig   albuterol (VENTOLIN HFA) 108 (90 Base) MCG/ACT inhaler Inhale 2 puffs into the lungs every 6 (six) hours as needed.   blood glucose meter kit and supplies Dispense based on patient and insurance preference. Use up to four times daily as directed. (FOR ICD-10 E10.9, E11.9).   cephALEXin (KEFLEX) 500 MG capsule Take 1 capsule (500 mg total) by mouth 2 (two) times daily.   clindamycin (CLEOCIN) 300 MG capsule Take 300 mg by mouth every 6 (six) hours.   clindamycin (CLEOCIN) 300 MG capsule Take by mouth.   clonazePAM (KLONOPIN) 0.5 MG tablet Take 0.5 tablets (0.25 mg total) by mouth at bedtime. For anxiety   metFORMIN (GLUCOPHAGE) 1000 MG tablet Take 1 tablet (1,000 mg total) by mouth 2 (two) times daily.   No facility-administered encounter medications on file as of 10/18/2021.    Allergies  Allergen Reactions   Adhesive [Tape] Dermatitis   Statins Other (See Comments)    Myalgia     Review of Systems  Constitutional:  Negative for activity change, appetite change, chills, fatigue and fever.  HENT: Negative.    Eyes: Negative.   Respiratory:  Negative for cough, chest tightness and shortness of breath.   Cardiovascular:  Positive for leg swelling. Negative for chest pain and palpitations.  Gastrointestinal:  Negative for blood in stool, constipation, diarrhea, nausea and vomiting.  Endocrine: Negative.   Genitourinary:  Negative for dysuria, frequency and urgency.  Musculoskeletal:  Negative for arthralgias and myalgias.  Skin:  Positive for color change, rash and wound.  Allergic/Immunologic: Negative.   Neurological:  Negative for dizziness and headaches.  Hematological: Negative.   Psychiatric/Behavioral:  Negative for confusion, hallucinations, sleep disturbance and suicidal ideas.   All other systems reviewed and are negative.        Objective:  BP 135/80   Pulse 71   Temp 98.4 F (36.9 C)   Ht '5\' 8"'  (1.727 m)   Wt 258 lb (117 kg)   SpO2 94%   BMI 39.23 kg/m    Wt Readings from Last 3 Encounters:  10/18/21 258 lb (117 kg)  10/17/21 258 lb 9.6 oz (117.3 kg)  01/08/21 273 lb 3.2 oz (123.9 kg)    Physical Exam Vitals and nursing note reviewed.  Constitutional:      General: She is not in acute distress.    Appearance: Normal appearance. She is obese. She is not ill-appearing, toxic-appearing or diaphoretic.  HENT:     Head: Normocephalic and atraumatic.     Mouth/Throat:     Mouth: Mucous membranes are moist.  Eyes:     Pupils: Pupils are equal, round, and reactive to light.  Cardiovascular:     Rate and Rhythm: Normal rate and regular rhythm.     Pulses: Normal pulses.  Pulmonary:     Effort: Pulmonary effort is normal.     Breath sounds: Normal breath sounds.  Musculoskeletal:     Right lower leg: Edema present.     Left lower leg: Edema present.  Skin:    General: Skin is warm and dry.     Capillary Refill: Capillary refill takes less than 2 seconds.     Comments: BLE unna boots on and intact.   Neurological:     General: No focal deficit present.     Mental Status: She is alert and oriented to person, place, and time.  Psychiatric:        Mood and Affect: Mood normal.        Behavior: Behavior normal.        Thought Content: Thought content normal.        Judgment: Judgment normal.     Results for orders placed or performed in visit on 01/08/21  CMP14+EGFR  Result Value Ref Range   Glucose 194 (H) 70 - 99 mg/dL   BUN 16 8 - 27 mg/dL   Creatinine, Ser 0.84 0.57 - 1.00 mg/dL   eGFR 79 >59 mL/min/1.73   BUN/Creatinine Ratio 19 12 - 28   Sodium 142 134 - 144 mmol/L   Potassium 4.9 3.5 - 5.2 mmol/L   Chloride 99 96 - 106 mmol/L   CO2 26 20 - 29 mmol/L   Calcium 9.9 8.7 - 10.3 mg/dL   Total Protein 6.7 6.0 - 8.5 g/dL   Albumin 4.2 3.8 - 4.8 g/dL   Globulin, Total 2.5 1.5 - 4.5  g/dL   Albumin/Globulin Ratio 1.7 1.2 - 2.2   Bilirubin Total 0.3 0.0 - 1.2 mg/dL   Alkaline Phosphatase 100 44 - 121 IU/L   AST 22 0 - 40 IU/L   ALT 29 0 - 32 IU/L  CBC with Differential/Platelet  Result Value Ref Range   WBC 14.3 (H) 3.4 - 10.8 x10E3/uL   RBC 5.84 (H) 3.77 - 5.28 x10E6/uL   Hemoglobin 16.1 (H) 11.1 - 15.9 g/dL   Hematocrit 50.6 (H) 34.0 - 46.6 %   MCV 87 79 - 97 fL   MCH 27.6 26.6 - 33.0 pg   MCHC 31.8 31.5 - 35.7 g/dL   RDW 13.4 11.7 - 15.4 %   Platelets 307 150 - 450 x10E3/uL   Neutrophils 60 Not Estab. %   Lymphs 29 Not Estab. %   Monocytes 6 Not Estab. %   Eos 3 Not Estab. %   Basos 1 Not Estab. %   Neutrophils Absolute 8.7 (H) 1.4 - 7.0 x10E3/uL   Lymphocytes Absolute 4.1 (H) 0.7 - 3.1 x10E3/uL   Monocytes Absolute 0.8 0.1 - 0.9 x10E3/uL   EOS (ABSOLUTE) 0.5 (H) 0.0 - 0.4 x10E3/uL   Basophils Absolute 0.1 0.0 - 0.2 x10E3/uL   Immature Granulocytes 1 Not Estab. %   Immature Grans (Abs) 0.1 0.0 - 0.1 x10E3/uL  Microalbumin / creatinine urine ratio  Result Value Ref Range   Creatinine, Urine 95.6 Not Estab. mg/dL   Microalbumin, Urine <3.0 Not Estab. ug/mL   Microalb/Creat Ratio <3 0 - 29 mg/g creat  Specimen status report  Result Value Ref Range   specimen status report Comment        Pertinent labs & imaging results that were available during my care of the patient were reviewed by me and considered in my medical decision making.  Assessment & Plan:  Kumiko was seen today for unna boot check.  Diagnoses and all orders for this visit:  Conflicted attitude towards treatment Visit for wound check Pt educated on purpose of [employer] therapy and agrees to continue with therapy. Reassurance provided.     Continue all other maintenance medications.  Follow up plan: Return if symptoms worsen or fail to improve.   Continue healthy lifestyle choices, including diet (rich in fruits, vegetables, and lean proteins, and low in salt and simple  carbohydrates) and exercise (at least 30 minutes of moderate physical activity daily).  Educational handout given for [employer]  The above assessment and management plan was discussed with the patient. The patient verbalized understanding of and has agreed to the management plan. Patient is aware to call the clinic if they develop any new symptoms or if symptoms persist or worsen. Patient is aware when to return to the clinic for a follow-up visit. Patient educated on when it is appropriate to go to the emergency department.   Monia Pouch, FNP-C [facility] [phone]

## 2021-10-19 NOTE — Patient Instructions (Addendum)
CeraVe or Cetaphil

## 2021-10-24 ENCOUNTER — Encounter: Payer: Self-pay | Admitting: Family Medicine

## 2021-10-24 ENCOUNTER — Ambulatory Visit (INDEPENDENT_AMBULATORY_CARE_PROVIDER_SITE_OTHER): Payer: BC Managed Care – PPO | Admitting: Family Medicine

## 2021-10-24 VITALS — BP 115/69 | HR 82 | Temp 98.0°F | Ht 68.0 in | Wt 258.0 lb

## 2021-10-24 DIAGNOSIS — L03115 Cellulitis of right lower limb: Secondary | ICD-10-CM | POA: Diagnosis not present

## 2021-10-24 DIAGNOSIS — L03116 Cellulitis of left lower limb: Secondary | ICD-10-CM | POA: Diagnosis not present

## 2021-10-24 NOTE — Progress Notes (Signed)
Subjective:  Patient ID: Carolyn Hooper, female    DOB: February 06, 1961, 61 y.o.   MRN: [id number]  Patient Care Team: [name], FNP as PCP - General (Nurse Practitioner) Danie Binder, MD (Inactive) as Consulting Physician (Gastroenterology)   Chief Complaint:  No chief complaint on file.   HPI: Carolyn Hooper is a 61 y.o. female presenting on 10/24/2021 for No chief complaint on file.   Pt presents today for reevaluation of bilateral lower extremity cellulitis. She was initially seen in the ED on 10/13/2021 and then in office on 10/17/2021. Unna boots were applied and she was instructed to continue Clindamycin as prescribed in the ED. Unna boots removed today and legs have greatly improved. No systemic signs of infection.      Relevant past medical, surgical, family, and social history reviewed and updated as indicated.  Allergies and medications reviewed and updated. Data reviewed: Chart in Epic.   Past Medical History:  Diagnosis Date   Abscess    hydroadenitis superativa   Anxiety    Hyperinsulinemia    Neuroma    audiotry neuroma   Vertigo     Past Surgical History:  Procedure Laterality Date   ABDOMINAL HYSTERECTOMY  2006   remaining left ovary   SHOULDER SURGERY Right 1960s    Social History   Socioeconomic History   Marital status: Divorced    Spouse name: Not on file   Number of children: Not on file   Years of education: Not on file   Highest education level: Not on file  Occupational History   Not on file  Tobacco Use   Smoking status: Every Day    Packs/day: 0.75    Types: Cigarettes   Smokeless tobacco: Never  Vaping Use   Vaping Use: Never used  Substance and Sexual Activity   Alcohol use: Yes    Comment: very rare   Drug use: No   Sexual activity: Not on file  Other Topics Concern   Not on file  Social History Narrative   Not on file   Social Determinants of Health   Financial Resource Strain: Not on file  Food Insecurity:  Not on file  Transportation Needs: Not on file  Physical Activity: Not on file  Stress: Not on file  Social Connections: Not on file  Intimate Partner Violence: Not on file    Outpatient Encounter Medications as of 10/24/2021  Medication Sig   albuterol (VENTOLIN HFA) 108 (90 Base) MCG/ACT inhaler Inhale 2 puffs into the lungs every 6 (six) hours as needed.   blood glucose meter kit and supplies Dispense based on patient and insurance preference. Use up to four times daily as directed. (FOR ICD-10 E10.9, E11.9).   cephALEXin (KEFLEX) 500 MG capsule Take 1 capsule (500 mg total) by mouth 2 (two) times daily.   clindamycin (CLEOCIN) 300 MG capsule Take 300 mg by mouth every 6 (six) hours.   clonazePAM (KLONOPIN) 0.5 MG tablet Take 0.5 tablets (0.25 mg total) by mouth at bedtime. For anxiety   metFORMIN (GLUCOPHAGE) 1000 MG tablet Take 1 tablet (1,000 mg total) by mouth 2 (two) times daily.   [EXPIRED] clindamycin (CLEOCIN) 300 MG capsule Take by mouth.   No facility-administered encounter medications on file as of 10/24/2021.    Allergies  Allergen Reactions   Adhesive [Tape] Dermatitis   Statins Other (See Comments)    Myalgia     Review of Systems  Constitutional:  Negative for activity  change, appetite change, chills, fatigue and fever.  HENT: Negative.    Eyes: Negative.   Respiratory:  Negative for cough, chest tightness and shortness of breath.   Cardiovascular:  Negative for chest pain, palpitations and leg swelling.  Gastrointestinal:  Negative for blood in stool, constipation, diarrhea, nausea and vomiting.  Endocrine: Negative.   Genitourinary:  Negative for dysuria, frequency and urgency.  Musculoskeletal:  Negative for arthralgias and myalgias.  Skin:  Positive for color change, rash and wound. Negative for pallor.  Allergic/Immunologic: Negative.   Neurological:  Negative for dizziness and headaches.  Hematological: Negative.   Psychiatric/Behavioral:  Negative for  confusion, hallucinations, sleep disturbance and suicidal ideas.   All other systems reviewed and are negative.       Objective:  BP 115/69   Pulse 82   Temp 98 F (36.7 C)   Ht '5\' 8"'  (1.727 m)   Wt 258 lb (117 kg)   SpO2 95%   BMI 39.23 kg/m    Wt Readings from Last 3 Encounters:  10/24/21 258 lb (117 kg)  10/18/21 258 lb (117 kg)  10/17/21 258 lb 9.6 oz (117.3 kg)    Physical Exam Vitals and nursing note reviewed.  Constitutional:      General: She is not in acute distress.    Appearance: Normal appearance. She is obese. She is not ill-appearing, toxic-appearing or diaphoretic.  HENT:     Head: Normocephalic and atraumatic.  Eyes:     Pupils: Pupils are equal, round, and reactive to light.  Cardiovascular:     Rate and Rhythm: Normal rate and regular rhythm.     Pulses: Normal pulses.     Heart sounds: Normal heart sounds. No murmur heard.    No friction rub. No gallop.  Pulmonary:     Effort: Pulmonary effort is normal.     Breath sounds: Normal breath sounds.  Skin:    General: Skin is warm and dry.     Capillary Refill: Capillary refill takes less than 2 seconds.     Comments: Minimal bilateral lower extremity erythema with minimal swelling, greatly improved.   Neurological:     Mental Status: She is alert.     Gait: Gait normal.  Psychiatric:        Mood and Affect: Mood normal.        Behavior: Behavior normal.        Thought Content: Thought content normal.        Judgment: Judgment normal.     Results for orders placed or performed in visit on 01/08/21  CMP14+EGFR  Result Value Ref Range   Glucose 194 (H) 70 - 99 mg/dL   BUN 16 8 - 27 mg/dL   Creatinine, Ser 0.84 0.57 - 1.00 mg/dL   eGFR 79 >59 mL/min/1.73   BUN/Creatinine Ratio 19 12 - 28   Sodium 142 134 - 144 mmol/L   Potassium 4.9 3.5 - 5.2 mmol/L   Chloride 99 96 - 106 mmol/L   CO2 26 20 - 29 mmol/L   Calcium 9.9 8.7 - 10.3 mg/dL   Total Protein 6.7 6.0 - 8.5 g/dL   Albumin 4.2 3.8 -  4.8 g/dL   Globulin, Total 2.5 1.5 - 4.5 g/dL   Albumin/Globulin Ratio 1.7 1.2 - 2.2   Bilirubin Total 0.3 0.0 - 1.2 mg/dL   Alkaline Phosphatase 100 44 - 121 IU/L   AST 22 0 - 40 IU/L   ALT 29 0 - 32 IU/L  CBC  with Differential/Platelet  Result Value Ref Range   WBC 14.3 (H) 3.4 - 10.8 x10E3/uL   RBC 5.84 (H) 3.77 - 5.28 x10E6/uL   Hemoglobin 16.1 (H) 11.1 - 15.9 g/dL   Hematocrit 50.6 (H) 34.0 - 46.6 %   MCV 87 79 - 97 fL   MCH 27.6 26.6 - 33.0 pg   MCHC 31.8 31.5 - 35.7 g/dL   RDW 13.4 11.7 - 15.4 %   Platelets 307 150 - 450 x10E3/uL   Neutrophils 60 Not Estab. %   Lymphs 29 Not Estab. %   Monocytes 6 Not Estab. %   Eos 3 Not Estab. %   Basos 1 Not Estab. %   Neutrophils Absolute 8.7 (H) 1.4 - 7.0 x10E3/uL   Lymphocytes Absolute 4.1 (H) 0.7 - 3.1 x10E3/uL   Monocytes Absolute 0.8 0.1 - 0.9 x10E3/uL   EOS (ABSOLUTE) 0.5 (H) 0.0 - 0.4 x10E3/uL   Basophils Absolute 0.1 0.0 - 0.2 x10E3/uL   Immature Granulocytes 1 Not Estab. %   Immature Grans (Abs) 0.1 0.0 - 0.1 x10E3/uL  Microalbumin / creatinine urine ratio  Result Value Ref Range   Creatinine, Urine 95.6 Not Estab. mg/dL   Microalbumin, Urine <3.0 Not Estab. ug/mL   Microalb/Creat Ratio <3 0 - 29 mg/g creat  Specimen status report  Result Value Ref Range   specimen status report Comment        Pertinent labs & imaging results that were available during my care of the patient were reviewed by me and considered in my medical decision making.  Assessment & Plan:  Diagnoses and all orders for this visit:  Bilateral cellulitis of lower leg Unna boots removed today. Legs have improved greatly, no need to reapply [employer] today. Complete full course of antibiotics. Wash lower extremities twice daily with soap and water and apply cetaphil or CeraVe.     Continue all other maintenance medications.  Follow up plan: Make follow up with PCP for management of chronic medial conditions.    Continue healthy lifestyle  choices, including diet (rich in fruits, vegetables, and lean proteins, and low in salt and simple carbohydrates) and exercise (at least 30 minutes of moderate physical activity daily).   The above assessment and management plan was discussed with the patient. The patient verbalized understanding of and has agreed to the management plan. Patient is aware to call the clinic if they develop any new symptoms or if symptoms persist or worsen. Patient is aware when to return to the clinic for a follow-up visit. Patient educated on when it is appropriate to go to the emergency department.   Monia Pouch, FNP-C [facility] [phone]

## 2021-10-29 ENCOUNTER — Encounter: Payer: Self-pay | Admitting: Family

## 2021-10-29 ENCOUNTER — Ambulatory Visit (INDEPENDENT_AMBULATORY_CARE_PROVIDER_SITE_OTHER): Payer: BC Managed Care – PPO | Admitting: Family

## 2021-10-29 ENCOUNTER — Telehealth: Payer: Self-pay | Admitting: Family

## 2021-10-29 VITALS — BP 128/70 | HR 77 | Temp 97.5°F | Ht 68.0 in | Wt 258.8 lb

## 2021-10-29 DIAGNOSIS — L03115 Cellulitis of right lower limb: Secondary | ICD-10-CM | POA: Diagnosis not present

## 2021-10-29 DIAGNOSIS — F17200 Nicotine dependence, unspecified, uncomplicated: Secondary | ICD-10-CM

## 2021-10-29 DIAGNOSIS — L03116 Cellulitis of left lower limb: Secondary | ICD-10-CM

## 2021-10-29 DIAGNOSIS — R609 Edema, unspecified: Secondary | ICD-10-CM | POA: Diagnosis not present

## 2021-10-29 MED ORDER — DOXYCYCLINE HYCLATE 100 MG PO TABS: 100.0000 mg | ORAL_TABLET | Freq: Two times a day (BID) | ORAL | 0 refills | Status: DC

## 2021-10-29 NOTE — Patient Instructions (Signed)

## 2021-10-29 NOTE — Progress Notes (Signed)
Subjective:    Patient ID: Carolyn Hooper, female    DOB: 12-24-1960, 61 y.o.   MRN: [id number]  Chief Complaint  Patient presents with   Cellulitis   fmla    HPI Pt presents to the office today with bilateral cellulitis. She was seen on 10/13/21 and started on clindamycin. Then was seen on 10/17/21 and unna boots applied and told to complete clindamycin.   She denies any fevers, warmth, discharge, and tenderness.   She is a current smoker to 1/2-1 pack a day.   She is also caring for her 77 mother and takes her doctors appointments.   Review of Systems  Skin:  Positive for color change.  All other systems reviewed and are negative.      Objective:   Physical Exam Vitals reviewed.  Constitutional:      General: She is not in acute distress.    Appearance: She is well-developed. She is obese.  HENT:     Head: Normocephalic and atraumatic.  Eyes:     Pupils: Pupils are equal, round, and reactive to light.  Neck:     Thyroid: No thyromegaly.  Cardiovascular:     Rate and Rhythm: Normal rate and regular rhythm.     Heart sounds: Normal heart sounds. No murmur heard. Pulmonary:     Effort: Pulmonary effort is normal. No respiratory distress.     Breath sounds: Normal breath sounds. No wheezing.  Abdominal:     General: Bowel sounds are normal. There is no distension.     Palpations: Abdomen is soft.     Tenderness: There is no abdominal tenderness.  Musculoskeletal:        General: No tenderness. Normal range of motion.     Cervical back: Normal range of motion and neck supple.  Skin:    General: Skin is warm and dry.     Findings: Erythema present.     Comments: Bilateral legs erythemas, feet erythemas and warmth present. No tenderness noted.   Neurological:     Mental Status: She is alert and oriented to person, place, and time.     Cranial Nerves: No cranial nerve deficit.     Deep Tendon Reflexes: Reflexes are normal and symmetric.  Psychiatric:         Behavior: Behavior normal.        Thought Content: Thought content normal.        Judgment: Judgment normal.        BP 128/70   Pulse 77   Temp (!) 97.5 F (36.4 C) (Temporal)   Ht _0  (1.727 m)   Wt 258 lb 12.8 oz (117.4 kg)   BMI 39.35 kg/m       Assessment & Plan:  Carolyn Hooper comes in today with chief complaint of Cellulitis and fmla   Diagnosis and orders addressed:  1. Bilateral cellulitis of lower leg Stop Clindamycin and doxycyline   - doxycycline (VIBRA-TABS) 100 MG tablet; Take 1 tablet (100 mg total) by mouth 2 (two) times daily.  Dispense: 20 tablet; Refill: 0 - Compression stockings - CBC with Differential/Platelet - BMP8+EGFR  2. Peripheral edema - Compression stockings - CBC with Differential/Platelet - BMP8+EGFR  Keep feet elevated Start doxycyline  Pt does not want Unna boots, will try compression stockings  Labs pending  Requesting intermittent FMLA for doctor visits, hard to sit for long periods of time related to pain. Also caring for her 34 year old mother to doctor appointments.  Follow up plan: 4 days    Evelina Dun, FNP

## 2021-10-29 NOTE — Telephone Encounter (Signed)
Talked to [facility]

## 2021-10-29 NOTE — Patient Instructions (Signed)
Our records indicate that you are due for your annual mammogram/breast imaging. While there is no way to prevent breast cancer, early detection provides the best opportunity for curing it. For women over the age of 62, the [place] recommends a yearly clinical breast exam and a yearly mammogram. These practices have saved thousands of lives. We need your help to ensure that you are receiving optimal medical care. Please call the imaging location that has done you previous mammograms. Please remember to list [place] as your primary care. This helps make sure we receive a report and can update your chart.  Below is the contact information for several local breast imaging centers. You may call the location that works best for you, and they will be happy to assistance in making you an appointment. You do not need an order for a regular screening mammogram. However, if you are having any problems or concerns with you breast area, please let your primary care provider know, and appropriate orders will be placed. Please let our office know if you have any questions or concerns. Or if you need information for another imaging center not on this list or outside of the area. We are commented to working with you on your health care journey.   The mobile unit/bus ([facility]) - they come twice a month to our location.  These appointments can be made through our office or by call [facility] [place]  [place], [place] [address] Phone [phone]  [facility] Radiology Department  [address] [place], [place] [address] [phone]  [facility] ([facility] of [place])  [phone] [address], [place] [address] [phone]  [place] [address] [place] [address] [phone]  [place]  [address] [place] [address] [phone]  [facility] in [place], [place] [address] [phone]  [facility] [place], [place] [address] [phone]  [facility] at [facility] [place]  [place], [place] [address] [phone]  [place] [facility] [place] [address] [phone]

## 2021-10-30 ENCOUNTER — Other Ambulatory Visit: Payer: Self-pay | Admitting: Family

## 2021-10-30 ENCOUNTER — Telehealth: Payer: Self-pay | Admitting: Family

## 2021-10-30 DIAGNOSIS — Z79899 Other long term (current) drug therapy: Secondary | ICD-10-CM

## 2021-10-30 DIAGNOSIS — F1320 Sedative, hypnotic or anxiolytic dependence, uncomplicated: Secondary | ICD-10-CM

## 2021-10-30 DIAGNOSIS — F411 Generalized anxiety disorder: Secondary | ICD-10-CM

## 2021-10-30 LAB — BMP8+EGFR
BUN/Creatinine Ratio: 15 (ref 12–28)
BUN: 12 mg/dL (ref 8–27)
CO2: 22 mmol/L (ref 20–29)
Calcium: 9.4 mg/dL (ref 8.7–10.3)
Chloride: 101 mmol/L (ref 96–106)
Creatinine, Ser: 0.8 mg/dL (ref 0.57–1.00)
Glucose: 143 mg/dL — ABNORMAL HIGH (ref 70–99)
Potassium: 4.6 mmol/L (ref 3.5–5.2)
Sodium: 142 mmol/L (ref 134–144)
eGFR: 84 mL/min/{1.73_m2} (ref >59)

## 2021-10-30 LAB — CBC WITH DIFFERENTIAL/PLATELET
Basophils Absolute: 0.1 10*3/uL (ref 0.0–0.2)
Basos: 1 %
EOS (ABSOLUTE): 0.4 10*3/uL (ref 0.0–0.4)
Eos: 3 %
Hematocrit: 50.2 % — ABNORMAL HIGH (ref 34.0–46.6)
Hemoglobin: 16.6 g/dL — ABNORMAL HIGH (ref 11.1–15.9)
Immature Grans (Abs): 0 10*3/uL (ref 0.0–0.1)
Immature Granulocytes: 0 %
Lymphocytes Absolute: 2.9 10*3/uL (ref 0.7–3.1)
Lymphs: 22 %
MCH: 28.8 pg (ref 26.6–33.0)
MCHC: 33.1 g/dL (ref 31.5–35.7)
MCV: 87 fL (ref 79–97)
Monocytes Absolute: 0.9 10*3/uL (ref 0.1–0.9)
Monocytes: 7 %
Neutrophils Absolute: 8.9 10*3/uL — ABNORMAL HIGH (ref 1.4–7.0)
Neutrophils: 67 %
Platelets: 316 10*3/uL (ref 150–450)
RBC: 5.77 x10E6/uL — ABNORMAL HIGH (ref 3.77–5.28)
RDW: 13.8 % (ref 11.7–15.4)
WBC: 13.2 10*3/uL — ABNORMAL HIGH (ref 3.4–10.8)

## 2021-10-30 NOTE — Telephone Encounter (Signed)
Lmtcb.

## 2021-10-30 NOTE — Telephone Encounter (Signed)
Patient aware and verbalized understanding. °

## 2021-10-30 NOTE — Telephone Encounter (Signed)
Did she go to [facility] have them measure her?  We are working on her [employer]

## 2021-11-01 ENCOUNTER — Ambulatory Visit (INDEPENDENT_AMBULATORY_CARE_PROVIDER_SITE_OTHER): Payer: BC Managed Care – PPO | Admitting: Family

## 2021-11-01 ENCOUNTER — Encounter: Payer: Self-pay | Admitting: Family

## 2021-11-01 VITALS — BP 113/75 | HR 74 | Temp 98.2°F | Ht 68.0 in | Wt 256.2 lb

## 2021-11-01 DIAGNOSIS — F17200 Nicotine dependence, unspecified, uncomplicated: Secondary | ICD-10-CM

## 2021-11-01 DIAGNOSIS — E1169 Type 2 diabetes mellitus with other specified complication: Secondary | ICD-10-CM

## 2021-11-01 DIAGNOSIS — L732 Hidradenitis suppurativa: Secondary | ICD-10-CM

## 2021-11-01 DIAGNOSIS — M791 Myalgia, unspecified site: Secondary | ICD-10-CM

## 2021-11-01 DIAGNOSIS — Z0001 Encounter for general adult medical examination with abnormal findings: Secondary | ICD-10-CM

## 2021-11-01 DIAGNOSIS — F411 Generalized anxiety disorder: Secondary | ICD-10-CM

## 2021-11-01 DIAGNOSIS — Z6841 Body Mass Index (BMI) 40.0 and over, adult: Secondary | ICD-10-CM

## 2021-11-01 DIAGNOSIS — F1320 Sedative, hypnotic or anxiolytic dependence, uncomplicated: Secondary | ICD-10-CM

## 2021-11-01 DIAGNOSIS — E785 Hyperlipidemia, unspecified: Secondary | ICD-10-CM | POA: Diagnosis not present

## 2021-11-01 DIAGNOSIS — Z0000 Encounter for general adult medical examination without abnormal findings: Secondary | ICD-10-CM | POA: Diagnosis not present

## 2021-11-01 DIAGNOSIS — L03116 Cellulitis of left lower limb: Secondary | ICD-10-CM

## 2021-11-01 DIAGNOSIS — Z79899 Other long term (current) drug therapy: Secondary | ICD-10-CM

## 2021-11-01 DIAGNOSIS — Z5320 Procedure and treatment not carried out because of patient's decision for unspecified reasons: Secondary | ICD-10-CM

## 2021-11-01 DIAGNOSIS — L03115 Cellulitis of right lower limb: Secondary | ICD-10-CM

## 2021-11-01 DIAGNOSIS — D333 Benign neoplasm of cranial nerves: Secondary | ICD-10-CM

## 2021-11-01 DIAGNOSIS — E161 Other hypoglycemia: Secondary | ICD-10-CM

## 2021-11-01 LAB — BAYER DCA HB A1C WAIVED: HB A1C (BAYER DCA - WAIVED): 7 % — ABNORMAL HIGH (ref 4.8–5.6)

## 2021-11-01 MED ORDER — METFORMIN HCL 1000 MG PO TABS: 1000.0000 mg | ORAL_TABLET | Freq: Two times a day (BID) | ORAL | 1 refills | Status: DC

## 2021-11-01 MED ORDER — CLONAZEPAM 0.5 MG PO TABS: 0.2500 mg | ORAL_TABLET | Freq: Every day | ORAL | 2 refills | Status: DC

## 2021-11-01 NOTE — Progress Notes (Signed)
Subjective:    Patient ID: Carolyn Hooper, female    DOB: 1960/06/12, 61 y.o.   MRN: [id number]  Chief Complaint  Patient presents with   Cellulitis   Pt presents to the office today for CPE without pap and follow up on cellulitis. Pt has hidradenitis suppurative and takes Keflex as needed.   She seen three days ago and started on doxycycline. Denies any pain.   Pt is morbid obese with a BMI 38 and DM and hyperlipidemia.   She reports in 2013 she was diagnosed with a right IAC vestibular schwannoma, 6X6X64m. She repeated her MRI on 01/25/21 that showed, "Redemonstrated right IAC vestibular schwannoma, which has decreased in size compared to 2013." Hyperlipidemia This is a chronic problem. The problem is uncontrolled. Exacerbating diseases include obesity. Current antihyperlipidemic treatment includes diet change. The current treatment provides moderate improvement of lipids. Risk factors for coronary artery disease include dyslipidemia, diabetes mellitus, hypertension, a sedentary lifestyle and post-menopausal.  Diabetes She presents for her follow-up diabetic visit. She has type 2 diabetes mellitus. Hypoglycemia symptoms include nervousness/anxiousness. Pertinent negatives for diabetes include no blurred vision. Symptoms are stable. Risk factors for coronary artery disease include dyslipidemia, diabetes mellitus, hypertension and sedentary lifestyle. She is following a generally unhealthy diet. (Does not check glucose )  Anxiety Presents for follow-up visit. Symptoms include excessive worry and nervous/anxious behavior. Patient reports no irritability or restlessness. Symptoms occur occasionally. The severity of symptoms is mild.    Nicotine Dependence Presents for follow-up visit. Symptoms are negative for irritability. Her urge triggers include company of smokers. The symptoms have been stable. She smokes 1 pack of cigarettes per day.      Review of Systems  Constitutional:   Negative for irritability.  Eyes:  Negative for blurred vision.  Psychiatric/Behavioral:  The patient is nervous/anxious.    Family History  Problem Relation Age of Onset   COPD Mother    Heart disease Father    Diabetes Father    Kidney disease Father    Arthritis Father        RA   Liver disease Father    Social History   Socioeconomic History   Marital status: Divorced    Spouse name: Not on file   Number of children: Not on file   Years of education: Not on file   Highest education level: Not on file  Occupational History   Not on file  Tobacco Use   Smoking status: Every Day    Packs/day: 0.75    Types: Cigarettes   Smokeless tobacco: Never  Vaping Use   Vaping Use: Never used  Substance and Sexual Activity   Alcohol use: Yes    Comment: very rare   Drug use: No   Sexual activity: Not on file  Other Topics Concern   Not on file  Social History Narrative   Not on file   Social Determinants of Health   Financial Resource Strain: Not on file  Food Insecurity: Not on file  Transportation Needs: Not on file  Physical Activity: Not on file  Stress: Not on file  Social Connections: Not on file       Objective:   Physical Exam Vitals reviewed.  Constitutional:      General: She is not in acute distress.    Appearance: She is well-developed. She is obese.  HENT:     Head: Normocephalic and atraumatic.     Right Ear: Tympanic membrane normal.  Left Ear: Tympanic membrane normal.  Eyes:     Pupils: Pupils are equal, round, and reactive to light.  Neck:     Thyroid: No thyromegaly.  Cardiovascular:     Rate and Rhythm: Normal rate and regular rhythm.     Heart sounds: Normal heart sounds. No murmur heard. Pulmonary:     Effort: Pulmonary effort is normal. No respiratory distress.     Breath sounds: Normal breath sounds. No wheezing.  Abdominal:     General: Bowel sounds are normal. There is no distension.     Palpations: Abdomen is soft.      Tenderness: There is no abdominal tenderness.  Musculoskeletal:        General: No tenderness. Normal range of motion.     Cervical back: Normal range of motion and neck supple.     Right lower leg: Edema (trace) present.     Left lower leg: Edema (trace) present.     Comments: Bilateral feet warm to touch and erythemas, same as previous visit.  Skin:    General: Skin is warm and dry.  Neurological:     Mental Status: She is alert and oriented to person, place, and time.     Cranial Nerves: No cranial nerve deficit.     Deep Tendon Reflexes: Reflexes are normal and symmetric.  Psychiatric:        Behavior: Behavior normal.        Thought Content: Thought content normal.        Judgment: Judgment normal.           BP 113/75   Pulse 74   Temp 98.2 F (36.8 C) (Temporal)   Ht _0  (1.727 m)   Wt 256 lb 3.2 oz (116.2 kg)   SpO2 94%   BMI 38.96 kg/m   Assessment & Plan:  Carolyn Hooper comes in today with chief complaint of Cellulitis   Diagnosis and orders addressed:  1. Type 2 diabetes mellitus with other specified complication, without long-term current use of insulin (HCC) - Bayer DCA Hb A1c Waived - Microalbumin / creatinine urine ratio - metFORMIN (GLUCOPHAGE) 1000 MG tablet; Take 1 tablet (1,000 mg total) by mouth 2 (two) times daily.  Dispense: 180 tablet; Refill: 1 - CMP14+EGFR  2. Hyperlipidemia associated with type 2 diabetes mellitus (HCC) - Lipid panel - CMP14+EGFR  3. Unilateral vestibular schwannoma (HCC) - CMP14+EGFR  4. Hidradenitis suppurativa - CMP14+EGFR  5. Benzodiazepine dependence (HCC) - clonazePAM (KLONOPIN) 0.5 MG tablet; Take 0.5 tablets (0.25 mg total) by mouth at bedtime. For anxiety  Dispense: 30 tablet; Refill: 2 - ToxASSURE Select 13 (MW), Urine - CMP14+EGFR  6. Controlled substance agreement signed - clonazePAM (KLONOPIN) 0.5 MG tablet; Take 0.5 tablets (0.25 mg total) by mouth at bedtime. For anxiety  Dispense: 30 tablet;  Refill: 2 - ToxASSURE Select 13 (MW), Urine - CMP14+EGFR  7. Current smoker - CMP14+EGFR  8. Hyperinsulinemia - CMP14+EGFR  9. Generalized anxiety disorder - clonazePAM (KLONOPIN) 0.5 MG tablet; Take 0.5 tablets (0.25 mg total) by mouth at bedtime. For anxiety  Dispense: 30 tablet; Refill: 2 - CMP14+EGFR  10. Morbid obesity with BMI of 40.0-44.9, adult ([place]) - CMP14+EGFR  11. Myalgia due to statin  - CMP14+EGFR  12. Refusal of statin medication by patient  - CMP14+EGFR  13. Annual physical exam  - Bayer DCA Hb A1c Waived - Microalbumin / creatinine urine ratio - Lipid panel - TSH - CMP14+EGFR  14. Bilateral cellulitis of lower  leg Continue doxycycline, if continues to may need referral to vascular    Labs pending Health Maintenance reviewed Diet and exercise encouraged  Follow up plan: 2 weeks for cellulitis   Evelina Dun, FNP

## 2021-11-02 ENCOUNTER — Other Ambulatory Visit: Payer: Self-pay | Admitting: Family

## 2021-11-02 LAB — LIPID PANEL
Chol/HDL Ratio: 5.1 ratio — ABNORMAL HIGH (ref 0.0–4.4)
Cholesterol, Total: 185 mg/dL (ref 100–199)
HDL: 36 mg/dL — ABNORMAL LOW (ref >39)
LDL Chol Calc (NIH): 126 mg/dL — ABNORMAL HIGH (ref 0–99)
Triglycerides: 128 mg/dL (ref 0–149)
VLDL Cholesterol Cal: 23 mg/dL (ref 5–40)

## 2021-11-02 LAB — CMP14+EGFR
ALT: 32 IU/L (ref 0–32)
AST: 29 IU/L (ref 0–40)
Albumin/Globulin Ratio: 1.7 (ref 1.2–2.2)
Albumin: 3.9 g/dL (ref 3.9–4.9)
Alkaline Phosphatase: 94 IU/L (ref 44–121)
BUN/Creatinine Ratio: 14 (ref 12–28)
BUN: 13 mg/dL (ref 8–27)
Bilirubin Total: 0.4 mg/dL (ref 0.0–1.2)
CO2: 23 mmol/L (ref 20–29)
Calcium: 9.5 mg/dL (ref 8.7–10.3)
Chloride: 99 mmol/L (ref 96–106)
Creatinine, Ser: 0.9 mg/dL (ref 0.57–1.00)
Globulin, Total: 2.3 g/dL (ref 1.5–4.5)
Glucose: 168 mg/dL — ABNORMAL HIGH (ref 70–99)
Potassium: 4.4 mmol/L (ref 3.5–5.2)
Sodium: 141 mmol/L (ref 134–144)
Total Protein: 6.2 g/dL (ref 6.0–8.5)
eGFR: 73 mL/min/{1.73_m2} (ref >59)

## 2021-11-02 LAB — TSH: TSH: 2.21 u[IU]/mL (ref 0.450–4.500)

## 2021-11-03 LAB — MICROALBUMIN / CREATININE URINE RATIO
Creatinine, Urine: 255.1 mg/dL
Microalb/Creat Ratio: 15 mg/g creat (ref 0–29)
Microalbumin, Urine: 37.2 ug/mL

## 2021-11-06 LAB — TOXASSURE SELECT 13 (MW), URINE

## 2021-11-07 ENCOUNTER — Encounter: Payer: Self-pay | Admitting: Family Medicine

## 2021-11-16 ENCOUNTER — Ambulatory Visit (INDEPENDENT_AMBULATORY_CARE_PROVIDER_SITE_OTHER): Payer: BC Managed Care – PPO | Admitting: Family

## 2021-11-16 ENCOUNTER — Encounter: Payer: Self-pay | Admitting: Family

## 2021-11-16 VITALS — BP 116/59 | HR 68 | Temp 96.6°F | Ht 68.0 in | Wt 256.8 lb

## 2021-11-16 DIAGNOSIS — I739 Peripheral vascular disease, unspecified: Secondary | ICD-10-CM

## 2021-11-16 DIAGNOSIS — L03116 Cellulitis of left lower limb: Secondary | ICD-10-CM | POA: Diagnosis not present

## 2021-11-16 DIAGNOSIS — L03115 Cellulitis of right lower limb: Secondary | ICD-10-CM

## 2021-11-16 NOTE — Progress Notes (Signed)
   Subjective:    Patient ID: Carolyn Hooper, female    DOB: July 09, 1960, 61 y.o.   MRN: [id number]  Chief Complaint  Patient presents with   Follow-up    HPI  Pt presents to the office today recheck cellulitis. She completed doxycyline. She is wearing compression hose daily. Denies any pain, fever, edema, tenderness, or warmth.    Review of Systems  All other systems reviewed and are negative.      Objective:   Physical Exam Vitals reviewed.  Constitutional:      General: She is not in acute distress.    Appearance: She is well-developed. She is obese.  HENT:     Head: Normocephalic and atraumatic.  Eyes:     Pupils: Pupils are equal, round, and reactive to light.  Neck:     Thyroid: No thyromegaly.  Cardiovascular:     Rate and Rhythm: Normal rate and regular rhythm.     Heart sounds: Normal heart sounds. No murmur heard. Pulmonary:     Effort: Pulmonary effort is normal. No respiratory distress.     Breath sounds: Normal breath sounds. No wheezing.  Abdominal:     General: Bowel sounds are normal. There is no distension.     Palpations: Abdomen is soft.     Tenderness: There is no abdominal tenderness.  Musculoskeletal:        General: No tenderness. Normal range of motion.     Cervical back: Normal range of motion and neck supple.     Right lower leg: No edema.     Left lower leg: No edema.     Comments: Bilateral leg discoloration, no tenderness or warmth present  Skin:    General: Skin is warm and dry.  Neurological:     Mental Status: She is alert and oriented to person, place, and time.     Cranial Nerves: No cranial nerve deficit.     Deep Tendon Reflexes: Reflexes are normal and symmetric.  Psychiatric:        Behavior: Behavior normal.        Thought Content: Thought content normal.        Judgment: Judgment normal.     BP (!) 116/59   Pulse 68   Temp (!) 96.6 F (35.9 C) (Temporal)   Ht 5\' 8"  (1.727 m)   Wt 256 lb 12.8 oz (116.5 kg)   SpO2  96%   BMI 39.05 kg/m        Assessment & Plan:  Carolyn Hooper comes in today with chief complaint of Follow-up   Diagnosis and orders addressed:  1. Bilateral cellulitis of lower leg Resolved  2. PAD (peripheral artery disease) (HCC) Continue compression hose Elevation    Keep chronic follow up   Evelina Dun, FNP

## 2021-11-16 NOTE — Patient Instructions (Signed)
Peripheral Vascular Disease  Peripheral vascular disease (PVD) is a disease of the blood vessels. PVD may also be called peripheral artery disease (PAD) or poor circulation. PVD is the blocking or hardening of the arteries anywhere within the circulatory system beyond the heart. This can result in a decreased supply of blood to the arms, legs, and internal organs, such as the stomach or kidneys. However, PVD most often affects a person's lower legs and feet. Without treatment, PVD often worsens. PVD can lead to acute limb ischemia. This occurs when an arm or leg suddenly has trouble getting enough blood. This is a medical emergency. What are the causes? The most common cause of PVD is atherosclerosis. This is a buildup of fatty material and other substances (plaque)inside your arteries. Pieces of plaque can break off from the walls of an artery and become stuck in a smaller artery, blocking blood flow and possibly causing acute limb ischemia. Other common causes of PVD include: Blood clots that form inside the blood vessels. Injuries to blood vessels. Diseases that cause inflammation of blood vessels or cause blood vessel tightening (spasms). What increases the risk? The following factors may make you more likely to develop this condition: A family history of PVD. Common medical conditions, including: High cholesterol. Diabetes. High blood pressure (hypertension). Heart disease. Known atherosclerotic disease in another area of the body. Past injury, such as burns or a broken bone. Other medical conditions, such as: Buerger's disease. This is caused by inflamed blood vessels in your hands and feet. Some forms of arthritis. Birth defects that affect the arteries in your legs. Kidney disease. Using tobacco and nicotine products. Not getting enough exercise. Obesity. Being age 10 or older, or being age 40 or older and having the other risk factors. What are the signs or symptoms? This  condition may cause different symptoms. Your symptoms depend on what body part is not getting enough blood. Common signs and symptoms include: Cramps in your buttocks, legs, and feet. Intermittent claudication. This is pain and weakness in your legs during activity that resolves with rest. Leg pain at rest and leg numbness, tingling, or weakness. Coldness in a leg or foot, especially when compared to the other leg or foot. Skin or hair changes. These can include: Hair loss. Shiny skin. Pale or bluish skin. Thick toenails. Inability to get or maintain an erection (erectile dysfunction). Tiredness (fatigue). Weak pulse or no pulse in the feet. People with PVD are more likely to develop open wounds (ulcers) and sores on their toes, feet, or legs. The ulcers or sores may take longer than normal to heal. How is this diagnosed? PVD is diagnosed based on your signs and symptoms, a physical exam, and your medical history. You may also have other tests to find the cause. Tests include: Ankle-brachial index test.This test compares the blood pressure readings of the legs and arms. This may also include an exercise ankle-brachial index test in which you walk on a treadmill to check your symptoms. Doppler ultrasound. This takes pictures of blood flow through your blood vessels. Imaging studies that use dye to show blood flow. These are: CT angiogram. Magnetic resonance angiogram, or MRA. How is this treated? Treatment for PVD depends on the cause of your condition, how severe your symptoms are, and your age. Underlying causes need to be treated and controlled. These include long-term (chronic) conditions, such as diabetes, high cholesterol, and hypertension. Treatment may include: Lifestyle changes, such as: Quitting tobacco use. Exercising regularly. Following a  low-fat, low-cholesterol diet. Not drinking alcohol. Taking medicines, such as: Blood thinners to prevent blood clots. Medicines to  improve blood flow. Medicines to improve cholesterol levels. Procedures, such as: Angioplasty. This uses an inflated balloon to open a blocked artery and improve blood flow. Stent implant. This inserts a small mesh tube to keep a blocked artery open. Peripheral bypass surgery. This reroutes blood flow around a blocked artery. Surgery to remove dead tissue from an infected wound (debridement). Amputation. This is surgical removal of the affected limb. It may be necessary in cases of acute limb ischemia when medical or surgical treatments have not helped. Follow these instructions at home: Medicines Take over-the-counter and prescription medicines only as told by your health care provider. If you are taking blood thinners: Talk with your health care provider before you take any medicines that contain aspirin or NSAIDs, such as ibuprofen. These medicines increase your risk for dangerous bleeding. Take your medicine exactly as told, at the same time every day. Avoid activities that could cause injury or bruising, and follow instructions about how to prevent falls. Wear a medical alert bracelet or carry a card that lists what medicines you take. Lifestyle     Exercise regularly. Ask your health care provider about some good activities for you. Talk with your health care provider about maintaining a healthy weight. If needed, ask about losing weight. Eat a diet that is low in fat and cholesterol. If you need help, talk with your health care provider. Do not drink alcohol. Do not use any products that contain nicotine or tobacco. These products include cigarettes, chewing tobacco, and vaping devices, such as e-cigarettes. If you need help quitting, ask your health care provider. General instructions Take good care of your feet. To do this: Wear comfortable shoes that fit well. Check your feet often for any cuts or sores. Get an annual influenza vaccine. Keep all follow-up visits. This is  important. Where to find more information Society for Vascular Surgery: vascular.org American Heart Association: heart.org National Heart, Lung, and Blood Institute: https://www.hartman-hill.biz/ Contact a health care provider if: You have leg cramps while walking. You have leg pain when you rest. Your leg or foot feels cold. Your skin changes color. You have erectile dysfunction. You have cuts or sores on your legs or feet that do not heal. Get help right away if: You have sudden changes in color and feeling of your arms or legs, such as: Your arm or leg turns cold, numb, and blue. Your arm or leg becomes red, warm, swollen, painful, or numb. You have any symptoms of a stroke. "BE FAST" is an easy way to remember the main warning signs of a stroke: B - Balance. Signs are dizziness, sudden trouble walking, or loss of balance. E - Eyes. Signs are trouble seeing or a sudden change in vision. F - Face. Signs are sudden weakness or numbness of the face, or the face or eyelid drooping on one side. A - Arms. Signs are weakness or numbness in an arm. This happens suddenly and usually on one side of the body. S - Speech. Signs are sudden trouble speaking, slurred speech, or trouble understanding what people say. T - Time. Time to call emergency services. Write down what time symptoms started. You have other signs of a stroke, such as: A sudden, severe headache with no known cause. Nausea or vomiting. Seizure. You have chest pain or trouble breathing. These symptoms may represent a serious problem that is an emergency.  Do not wait to see if the symptoms will go away. Get medical help right away. Call your local emergency services (911 in the [place] Do not drive yourself to the hospital. Summary Peripheral vascular disease (PVD) is a disease of the blood vessels. PVD is the blocking or hardening of the arteries anywhere within the circulatory system beyond the heart. PVD may cause different symptoms. Your  symptoms depend on what part of your body is not getting enough blood. Treatment for PVD depends on what caused it, how severe your symptoms are, and your age. This information is not intended to replace advice given to you by your health care provider. Make sure you discuss any questions you have with your health care provider. Document Revised: 09/06/2019 Document Reviewed: 09/06/2019 Elsevier Patient Education  [place].

## 2022-02-11 ENCOUNTER — Emergency Department (HOSPITAL_COMMUNITY): Payer: BC Managed Care – PPO

## 2022-02-11 ENCOUNTER — Encounter (HOSPITAL_COMMUNITY): Payer: Self-pay

## 2022-02-11 ENCOUNTER — Observation Stay (HOSPITAL_COMMUNITY)
Admission: EM | Admit: 2022-02-11 | Discharge: 2022-02-13 | Disposition: A | Discharge status: Home / Self Care | Payer: BC Managed Care – PPO | Attending: Family Medicine | Admitting: Family Medicine

## 2022-02-11 ENCOUNTER — Other Ambulatory Visit: Payer: Self-pay

## 2022-02-11 DIAGNOSIS — I700 Atherosclerosis of aorta: Secondary | ICD-10-CM | POA: Diagnosis not present

## 2022-02-11 DIAGNOSIS — R569 Unspecified convulsions: Secondary | ICD-10-CM | POA: Diagnosis not present

## 2022-02-11 DIAGNOSIS — Z6838 Body mass index (BMI) 38.0-38.9, adult: Secondary | ICD-10-CM

## 2022-02-11 DIAGNOSIS — Z841 Family history of disorders of kidney and ureter: Secondary | ICD-10-CM

## 2022-02-11 DIAGNOSIS — Z833 Family history of diabetes mellitus: Secondary | ICD-10-CM

## 2022-02-11 DIAGNOSIS — C7931 Secondary malignant neoplasm of brain: Secondary | ICD-10-CM | POA: Diagnosis not present

## 2022-02-11 DIAGNOSIS — R220 Localized swelling, mass and lump, head: Secondary | ICD-10-CM | POA: Diagnosis not present

## 2022-02-11 DIAGNOSIS — G9389 Other specified disorders of brain: Secondary | ICD-10-CM

## 2022-02-11 DIAGNOSIS — Z7984 Long term (current) use of oral hypoglycemic drugs: Secondary | ICD-10-CM | POA: Diagnosis not present

## 2022-02-11 DIAGNOSIS — D72829 Elevated white blood cell count, unspecified: Secondary | ICD-10-CM | POA: Diagnosis not present

## 2022-02-11 DIAGNOSIS — E669 Obesity, unspecified: Secondary | ICD-10-CM | POA: Diagnosis not present

## 2022-02-11 DIAGNOSIS — I611 Nontraumatic intracerebral hemorrhage in hemisphere, cortical: Secondary | ICD-10-CM | POA: Diagnosis not present

## 2022-02-11 DIAGNOSIS — R471 Dysarthria and anarthria: Secondary | ICD-10-CM | POA: Diagnosis present

## 2022-02-11 DIAGNOSIS — F17210 Nicotine dependence, cigarettes, uncomplicated: Secondary | ICD-10-CM | POA: Diagnosis not present

## 2022-02-11 DIAGNOSIS — C3432 Malignant neoplasm of lower lobe, left bronchus or lung: Secondary | ICD-10-CM | POA: Diagnosis present

## 2022-02-11 DIAGNOSIS — I609 Nontraumatic subarachnoid hemorrhage, unspecified: Secondary | ICD-10-CM

## 2022-02-11 DIAGNOSIS — Z79899 Other long term (current) drug therapy: Secondary | ICD-10-CM | POA: Insufficient documentation

## 2022-02-11 DIAGNOSIS — Z888 Allergy status to other drugs, medicaments and biological substances status: Secondary | ICD-10-CM

## 2022-02-11 DIAGNOSIS — R9431 Abnormal electrocardiogram [ECG] [EKG]: Secondary | ICD-10-CM | POA: Diagnosis not present

## 2022-02-11 DIAGNOSIS — E1165 Type 2 diabetes mellitus with hyperglycemia: Secondary | ICD-10-CM | POA: Diagnosis not present

## 2022-02-11 DIAGNOSIS — I619 Nontraumatic intracerebral hemorrhage, unspecified: Secondary | ICD-10-CM | POA: Insufficient documentation

## 2022-02-11 DIAGNOSIS — Z8249 Family history of ischemic heart disease and other diseases of the circulatory system: Secondary | ICD-10-CM | POA: Diagnosis not present

## 2022-02-11 DIAGNOSIS — I959 Hypotension, unspecified: Secondary | ICD-10-CM | POA: Diagnosis not present

## 2022-02-11 DIAGNOSIS — Z1152 Encounter for screening for COVID-19: Secondary | ICD-10-CM | POA: Diagnosis not present

## 2022-02-11 DIAGNOSIS — R4701 Aphasia: Secondary | ICD-10-CM | POA: Diagnosis not present

## 2022-02-11 DIAGNOSIS — K5730 Diverticulosis of large intestine without perforation or abscess without bleeding: Secondary | ICD-10-CM | POA: Diagnosis not present

## 2022-02-11 DIAGNOSIS — E785 Hyperlipidemia, unspecified: Secondary | ICD-10-CM | POA: Diagnosis not present

## 2022-02-11 DIAGNOSIS — Z90710 Acquired absence of both cervix and uterus: Secondary | ICD-10-CM | POA: Diagnosis not present

## 2022-02-11 DIAGNOSIS — E119 Type 2 diabetes mellitus without complications: Secondary | ICD-10-CM | POA: Diagnosis present

## 2022-02-11 DIAGNOSIS — I639 Cerebral infarction, unspecified: Secondary | ICD-10-CM | POA: Diagnosis not present

## 2022-02-11 DIAGNOSIS — R053 Chronic cough: Secondary | ICD-10-CM | POA: Diagnosis present

## 2022-02-11 DIAGNOSIS — G936 Cerebral edema: Secondary | ICD-10-CM | POA: Diagnosis present

## 2022-02-11 DIAGNOSIS — I2510 Atherosclerotic heart disease of native coronary artery without angina pectoris: Secondary | ICD-10-CM | POA: Diagnosis not present

## 2022-02-11 DIAGNOSIS — Z825 Family history of asthma and other chronic lower respiratory diseases: Secondary | ICD-10-CM

## 2022-02-11 DIAGNOSIS — K8020 Calculus of gallbladder without cholecystitis without obstruction: Secondary | ICD-10-CM | POA: Diagnosis not present

## 2022-02-11 DIAGNOSIS — R918 Other nonspecific abnormal finding of lung field: Principal | ICD-10-CM | POA: Diagnosis present

## 2022-02-11 DIAGNOSIS — R4789 Other speech disturbances: Secondary | ICD-10-CM | POA: Diagnosis not present

## 2022-02-11 DIAGNOSIS — R4781 Slurred speech: Secondary | ICD-10-CM | POA: Diagnosis not present

## 2022-02-11 DIAGNOSIS — R531 Weakness: Secondary | ICD-10-CM | POA: Diagnosis present

## 2022-02-11 DIAGNOSIS — D3610 Benign neoplasm of peripheral nerves and autonomic nervous system, unspecified: Secondary | ICD-10-CM | POA: Diagnosis not present

## 2022-02-11 DIAGNOSIS — R29702 NIHSS score 2: Secondary | ICD-10-CM | POA: Diagnosis present

## 2022-02-11 DIAGNOSIS — Z91048 Other nonmedicinal substance allergy status: Secondary | ICD-10-CM

## 2022-02-11 DIAGNOSIS — Z8261 Family history of arthritis: Secondary | ICD-10-CM

## 2022-02-11 DIAGNOSIS — F419 Anxiety disorder, unspecified: Secondary | ICD-10-CM | POA: Diagnosis present

## 2022-02-11 DIAGNOSIS — R29818 Other symptoms and signs involving the nervous system: Secondary | ICD-10-CM | POA: Diagnosis not present

## 2022-02-11 DIAGNOSIS — R0689 Other abnormalities of breathing: Secondary | ICD-10-CM | POA: Diagnosis not present

## 2022-02-11 LAB — DIFFERENTIAL
Abs Immature Granulocytes: 0.05 10*3/uL (ref 0.00–0.07)
Basophils Absolute: 0.1 10*3/uL (ref 0.0–0.1)
Basophils Relative: 1 %
Eosinophils Absolute: 0.4 10*3/uL (ref 0.0–0.5)
Eosinophils Relative: 3 %
Immature Granulocytes: 0 %
Lymphocytes Relative: 22 %
Lymphs Abs: 2.7 10*3/uL (ref 0.7–4.0)
Monocytes Absolute: 0.7 10*3/uL (ref 0.1–1.0)
Monocytes Relative: 6 %
Neutro Abs: 8.6 10*3/uL — ABNORMAL HIGH (ref 1.7–7.7)
Neutrophils Relative %: 68 %

## 2022-02-11 LAB — CBC
HCT: 47.2 % — ABNORMAL HIGH (ref 36.0–46.0)
Hemoglobin: 15.4 g/dL — ABNORMAL HIGH (ref 12.0–15.0)
MCH: 28.6 pg (ref 26.0–34.0)
MCHC: 32.6 g/dL (ref 30.0–36.0)
MCV: 87.7 fL (ref 80.0–100.0)
Platelets: 274 10*3/uL (ref 150–400)
RBC: 5.38 MIL/uL — ABNORMAL HIGH (ref 3.87–5.11)
RDW: 14.4 % (ref 11.5–15.5)
WBC: 12.6 10*3/uL — ABNORMAL HIGH (ref 4.0–10.5)
nRBC: 0 % (ref 0.0–0.2)

## 2022-02-11 LAB — COMPREHENSIVE METABOLIC PANEL
ALT: 31 U/L (ref 0–44)
AST: 23 U/L (ref 15–41)
Albumin: 3.7 g/dL (ref 3.5–5.0)
Alkaline Phosphatase: 70 U/L (ref 38–126)
Anion gap: 13 (ref 5–15)
BUN: 13 mg/dL (ref 8–23)
CO2: 25 mmol/L (ref 22–32)
Calcium: 9.2 mg/dL (ref 8.9–10.3)
Chloride: 102 mmol/L (ref 98–111)
Creatinine, Ser: 0.76 mg/dL (ref 0.44–1.00)
GFR, Estimated: >60 mL/min (ref >60)
Glucose, Bld: 195 mg/dL — ABNORMAL HIGH (ref 70–99)
Potassium: 3.6 mmol/L (ref 3.5–5.1)
Sodium: 140 mmol/L (ref 135–145)
Total Bilirubin: 0.3 mg/dL (ref 0.3–1.2)
Total Protein: 7 g/dL (ref 6.5–8.1)

## 2022-02-11 LAB — I-STAT CHEM 8, ED
BUN: 14 mg/dL (ref 8–23)
Calcium, Ion: 0.91 mmol/L — ABNORMAL LOW (ref 1.15–1.40)
Chloride: 105 mmol/L (ref 98–111)
Creatinine, Ser: 0.7 mg/dL (ref 0.44–1.00)
Glucose, Bld: 184 mg/dL — ABNORMAL HIGH (ref 70–99)
HCT: 46 % (ref 36.0–46.0)
Hemoglobin: 15.6 g/dL — ABNORMAL HIGH (ref 12.0–15.0)
Potassium: 3.8 mmol/L (ref 3.5–5.1)
Sodium: 138 mmol/L (ref 135–145)
TCO2: 24 mmol/L (ref 22–32)

## 2022-02-11 LAB — PROTIME-INR
INR: 0.9 (ref 0.8–1.2)
Prothrombin Time: 12.3 seconds (ref 11.4–15.2)

## 2022-02-11 LAB — ETHANOL: Alcohol, Ethyl (B): <10 mg/dL (ref <10)

## 2022-02-11 LAB — APTT: aPTT: 27 seconds (ref 24–36)

## 2022-02-11 MED ORDER — DEXAMETHASONE SODIUM PHOSPHATE 4 MG/ML IJ SOLN
4.0000 mg | Freq: Four times a day (QID) | INTRAMUSCULAR | Status: DC
  Administered 2022-02-12 – 2022-02-13 (×6): 4 mg via INTRAVENOUS
  Filled 2022-02-11 (×6): qty 1

## 2022-02-11 MED ORDER — DIPHENHYDRAMINE HCL 50 MG/ML IJ SOLN
25.0000 mg | Freq: Once | INTRAMUSCULAR | Status: AC
  Administered 2022-02-12: 25 mg via INTRAVENOUS
  Filled 2022-02-11: qty 1

## 2022-02-11 MED ORDER — DEXAMETHASONE SODIUM PHOSPHATE 10 MG/ML IJ SOLN
10.0000 mg | Freq: Once | INTRAMUSCULAR | Status: AC
  Administered 2022-02-12: 10 mg via INTRAVENOUS
  Filled 2022-02-11: qty 1

## 2022-02-11 MED ORDER — ONDANSETRON HCL 4 MG/2ML IJ SOLN
INTRAMUSCULAR | Status: AC
  Administered 2022-02-11: 4 mg
  Filled 2022-02-11: qty 2

## 2022-02-11 MED ORDER — IOHEXOL 350 MG/ML SOLN
100.0000 mL | Freq: Once | INTRAVENOUS | Status: AC | PRN
  Administered 2022-02-12: 100 mL via INTRAVENOUS

## 2022-02-11 NOTE — Progress Notes (Signed)
2233 Code Stroke cart activated-LKW 2100, expressive aphasia. Pt already in CT 2239 TS paged 2247 pt back from CT, TS called on phone, having issues with logging on 2254 Dr Thera Flake on camera 2255 CT read- Large area of vasogenic edema in the left frontal/parietal lobe with overlying subarachnoid blood given to Dr Fayrene Helper.

## 2022-02-11 NOTE — ED Triage Notes (Signed)
Pt bib EMS after pt woke up around 9pm and was having difficulty forming words. Pt called son who then notified EMS. Per EMS, pt had slurred speech and difficulty forming words. No hx of stroke. 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 Dr Langston Masker at bedside when pt brought in and code stroke activated at that time. Pt taken immediately to CT scan. LKW was 2100.

## 2022-02-11 NOTE — ED Provider Notes (Signed)
[facility] EMERGENCY DEPARTMENT Provider Note   CSN: [id number] Arrival date & time: 02/11/22  2230  An emergency department physician performed an initial assessment on this suspected stroke patient at 2233.  History  Chief Complaint  Patient presents with   Code Stroke    Carolyn Hooper is a 61 y.o. female w/ hx of diabetes here with difficulty speaking, onset around 9 pm this evening, she called her son at that time feeling unwell.  Reports she has had worsening nausea and dizziness since her ambulance ride into the ED.  Denies prior hx of stroke.  She does have a history of a right-sided schwannoma noted on MRI of her brain approximate 1 year ago November 2022.  HPI     Home Medications Prior to Admission medications   Medication Sig Start Date End Date Taking? Authorizing Provider  albuterol (VENTOLIN HFA) 108 (90 Base) MCG/ACT inhaler Inhale 2 puffs into the lungs every 6 (six) hours as needed. 01/08/21   [name], FNP  blood glucose meter kit and supplies Dispense based on patient and insurance preference. Use up to four times daily as directed. (FOR ICD-10 E10.9, E11.9). 09/21/19   Evelina Dun A, FNP  clonazePAM (KLONOPIN) 0.5 MG tablet Take 0.5 tablets (0.25 mg total) by mouth at bedtime. For anxiety 11/01/21   Evelina Dun A, FNP  metFORMIN (GLUCOPHAGE) 1000 MG tablet Take 1 tablet (1,000 mg total) by mouth 2 (two) times daily. 11/01/21   [name], FNP      Allergies    Adhesive [tape] and Statins    Review of Systems   Review of Systems  Physical Exam Updated Vital Signs BP (!) 99/55 (BP Location: Left Arm)   Pulse 80   Temp 97.6 F (36.4 C) (Oral)   Resp 17   SpO2 95%  Physical Exam Constitutional:      General: She is not in acute distress. HENT:     Head: Normocephalic and atraumatic.  Eyes:     Conjunctiva/sclera: Conjunctivae normal.     Pupils: Pupils are equal, round, and reactive to light.  Cardiovascular:     Rate and  Rhythm: Normal rate and regular rhythm.  Pulmonary:     Effort: Pulmonary effort is normal. No respiratory distress.  Abdominal:     General: There is no distension.     Tenderness: There is no abdominal tenderness.  Skin:    General: Skin is warm and dry.  Neurological:     Mental Status: She is alert and oriented to person, place, and time. Mental status is at baseline.     Comments: Expressive aphasia, normal strength testing in all extremities, sensation in tact  Psychiatric:        Mood and Affect: Mood normal.        Behavior: Behavior normal.     ED Results / Procedures / Treatments   Labs (all labs ordered are listed, but only abnormal results are displayed) Labs Reviewed  CBC - Abnormal; Notable for the following components:      Result Value   WBC 12.6 (*)    RBC 5.38 (*)    Hemoglobin 15.4 (*)    HCT 47.2 (*)    All other components within normal limits  DIFFERENTIAL - Abnormal; Notable for the following components:   Neutro Abs 8.6 (*)    All other components within normal limits  COMPREHENSIVE METABOLIC PANEL - Abnormal; Notable for the following components:   Glucose, Bld 195 (*)  All other components within normal limits  I-STAT CHEM 8, ED - Abnormal; Notable for the following components:   Glucose, Bld 184 (*)    Calcium, Ion 0.91 (*)    Hemoglobin 15.6 (*)    All other components within normal limits  ETHANOL  PROTIME-INR  APTT  RAPID URINE DRUG SCREEN, HOSP PERFORMED  URINALYSIS, ROUTINE W REFLEX MICROSCOPIC    EKG EKG Interpretation  Date/Time:  Monday February 11 2022 22:55:56 EST Ventricular Rate:  80 PR Interval:  172 QRS Duration: 109 QT Interval:  420 QTC Calculation: 485 R Axis:   4 Text Interpretation: Sinus rhythm Confirmed by Octaviano Glow [phone]) on 02/11/2022 11:05:23 PM  Radiology CT HEAD CODE STROKE WO CONTRAST  Result Date: 02/11/2022 CLINICAL DATA:  Code stroke. EXAM: CT HEAD WITHOUT CONTRAST TECHNIQUE: Contiguous  axial images were obtained from the base of the skull through the vertex without intravenous contrast. RADIATION DOSE REDUCTION: This exam was performed according to the departmental dose-optimization program which includes automated exposure control, adjustment of the mA and/or kV according to patient size and/or use of iterative reconstruction technique. COMPARISON:  Brain MRI 01/25/2021 FINDINGS: Brain: There is a large area of vasogenic edema within the left frontal and parietal lobes. On the right, there is a punctate hyperdense focus with adjacent vasogenic edema at the superior paramedian right frontal lobe (3:30). No midline shift. Vascular: No abnormal hyperdensity of the major intracranial arteries or dural venous sinuses. No intracranial atherosclerosis. Skull: The visualized skull base, calvarium and extracranial soft tissues are normal. Sinuses/Orbits: No fluid levels or advanced mucosal thickening of the visualized paranasal sinuses. No mastoid or middle ear effusion. The orbits are normal. IMPRESSION: Large area of vasogenic edema in the left frontal/parietal lobe with overlying subarachnoid blood. Second, smaller lesion in the superior right frontal lobe. To gather appearance is concerning for metastatic disease. MRI of the brain with and without contrast is suggested. Critical Value/emergent results were called by telephone at the time of interpretation on 02/11/2022 at 10:53 pm to provider Brendaliz Kuk , who verbally acknowledged these results. Electronically Signed   By: Ulyses Jarred M.D.   On: 02/11/2022 22:53    Procedures Procedures    Medications Ordered in ED Medications  dexamethasone (DECADRON) injection 10 mg (has no administration in time range)  diphenhydrAMINE (BENADRYL) injection 25 mg (has no administration in time range)  dexamethasone (DECADRON) injection 4 mg (has no administration in time range)  iohexol (OMNIPAQUE) 350 MG/ML injection 100 mL (has no administration  in time range)  ondansetron (ZOFRAN) 4 MG/2ML injection (4 mg  Given 02/11/22 2236)    ED Course/ Medical Decision Making/ A&P Clinical Course as of 02/12/22 0006  Mon Feb 11, 2022  2235 Code stroke, LKW approx 9 pm this evening, expressive aphasia, accucheck ~200, at CT now.  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  I suspect this is the likely etiology of her speech difficulty, particularly the parietal mass.  I will discuss case with Neurosurgery, anticipating medical admission. [MT]  2346 Neurologist recommending MRI brain w/ and w/o contrast, admission.  Neurosurgery recommending IV steroids, admission to [facility].  Will need transfer.  No available beds at this time.  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 [MT]    Clinical Course User Index [MT] Keriana Sarsfield, Carola Rhine, MD  Medical Decision Making Amount and/or Complexity of Data Reviewed Labs: ordered. Radiology: ordered.  Risk Prescription drug management. Decision regarding hospitalization.   This patient presents to the ED with concern for aphasia, vertigo. This involves an extensive number of treatment options, and is a complaint that carries with it a high risk of complications and morbidity.  The differential diagnosis includes CVA vs other  Co-morbidities that complicate the patient evaluation: diabetes risk factor for CVA  Additional history obtained from EMS  I ordered and personally interpreted labs.  The pertinent results include: No emergent findings  I ordered imaging studies including CT head I independently visualized and interpreted imaging which showed intracranial mass with vasogenic edema I agree with the radiologist interpretation  The patient was maintained on a cardiac monitor.  I personally viewed and interpreted the cardiac monitored which showed an underlying rhythm of: Sinus  rhythm  Per my interpretation the patient's ECG shows sinus rhythm no acute infarct  I ordered medication including IV Decadron for vasogenic edema.  IV Zofran for nausea.     I requested consultation with the neurology, neurosurgery and discussed lab and imaging findings as well as pertinent plan - they recommend: Medical admission for MR imaging of the brain with without contrast, CT imaging as an angiogram of the head and neck, and then likely cancer staging with imaging of the chest abdomen pelvis.  The neurosurgery team recommended IV Decadron 10 mg loading dose and then 4 mg q6h. they will consult on the patient when she is transferred to [facility].  The neurologist agreed with these general recommendations, and did not have specific recommendations for antiepileptics at this time.  However if the patient were to develop or display seizure-like activity, consider loading with antiepileptic medication, and obtaining EEG.  After the interventions noted above, I reevaluated the patient and found that they have: improved   Dispostion:  After consideration of the diagnostic results and the patients response to treatment, I feel that the patent would benefit from          Final Clinical Impression(s) / ED Diagnoses Final diagnoses:  Aphasia  Brain mass    Rx / DC Orders ED Discharge Orders     None         Iliyana Convey, Carola Rhine, MD 02/12/22 0006

## 2022-02-12 DIAGNOSIS — R4789 Other speech disturbances: Secondary | ICD-10-CM | POA: Diagnosis present

## 2022-02-12 DIAGNOSIS — Z6838 Body mass index (BMI) 38.0-38.9, adult: Secondary | ICD-10-CM | POA: Diagnosis not present

## 2022-02-12 DIAGNOSIS — E1165 Type 2 diabetes mellitus with hyperglycemia: Secondary | ICD-10-CM

## 2022-02-12 DIAGNOSIS — K8020 Calculus of gallbladder without cholecystitis without obstruction: Secondary | ICD-10-CM | POA: Diagnosis not present

## 2022-02-12 DIAGNOSIS — R918 Other nonspecific abnormal finding of lung field: Secondary | ICD-10-CM | POA: Diagnosis not present

## 2022-02-12 DIAGNOSIS — D72829 Elevated white blood cell count, unspecified: Secondary | ICD-10-CM

## 2022-02-12 DIAGNOSIS — Z7984 Long term (current) use of oral hypoglycemic drugs: Secondary | ICD-10-CM | POA: Diagnosis not present

## 2022-02-12 DIAGNOSIS — I639 Cerebral infarction, unspecified: Secondary | ICD-10-CM | POA: Diagnosis not present

## 2022-02-12 DIAGNOSIS — Z91048 Other nonmedicinal substance allergy status: Secondary | ICD-10-CM | POA: Diagnosis not present

## 2022-02-12 DIAGNOSIS — Z833 Family history of diabetes mellitus: Secondary | ICD-10-CM | POA: Diagnosis not present

## 2022-02-12 DIAGNOSIS — E785 Hyperlipidemia, unspecified: Secondary | ICD-10-CM | POA: Diagnosis present

## 2022-02-12 DIAGNOSIS — Z1152 Encounter for screening for COVID-19: Secondary | ICD-10-CM | POA: Diagnosis not present

## 2022-02-12 DIAGNOSIS — Z841 Family history of disorders of kidney and ureter: Secondary | ICD-10-CM | POA: Diagnosis not present

## 2022-02-12 DIAGNOSIS — Z90710 Acquired absence of both cervix and uterus: Secondary | ICD-10-CM | POA: Diagnosis not present

## 2022-02-12 DIAGNOSIS — G9389 Other specified disorders of brain: Secondary | ICD-10-CM

## 2022-02-12 DIAGNOSIS — D3610 Benign neoplasm of peripheral nerves and autonomic nervous system, unspecified: Secondary | ICD-10-CM | POA: Diagnosis present

## 2022-02-12 DIAGNOSIS — R569 Unspecified convulsions: Secondary | ICD-10-CM | POA: Diagnosis present

## 2022-02-12 DIAGNOSIS — I619 Nontraumatic intracerebral hemorrhage, unspecified: Secondary | ICD-10-CM | POA: Diagnosis not present

## 2022-02-12 DIAGNOSIS — I611 Nontraumatic intracerebral hemorrhage in hemisphere, cortical: Secondary | ICD-10-CM | POA: Diagnosis present

## 2022-02-12 DIAGNOSIS — G936 Cerebral edema: Secondary | ICD-10-CM | POA: Diagnosis present

## 2022-02-12 DIAGNOSIS — R4701 Aphasia: Secondary | ICD-10-CM

## 2022-02-12 DIAGNOSIS — C7931 Secondary malignant neoplasm of brain: Secondary | ICD-10-CM | POA: Diagnosis present

## 2022-02-12 DIAGNOSIS — Z79899 Other long term (current) drug therapy: Secondary | ICD-10-CM | POA: Diagnosis not present

## 2022-02-12 DIAGNOSIS — E669 Obesity, unspecified: Secondary | ICD-10-CM | POA: Diagnosis present

## 2022-02-12 DIAGNOSIS — Z8261 Family history of arthritis: Secondary | ICD-10-CM | POA: Diagnosis not present

## 2022-02-12 DIAGNOSIS — K5730 Diverticulosis of large intestine without perforation or abscess without bleeding: Secondary | ICD-10-CM | POA: Diagnosis not present

## 2022-02-12 DIAGNOSIS — C3432 Malignant neoplasm of lower lobe, left bronchus or lung: Secondary | ICD-10-CM | POA: Diagnosis present

## 2022-02-12 DIAGNOSIS — I609 Nontraumatic subarachnoid hemorrhage, unspecified: Secondary | ICD-10-CM

## 2022-02-12 DIAGNOSIS — F17210 Nicotine dependence, cigarettes, uncomplicated: Secondary | ICD-10-CM | POA: Diagnosis not present

## 2022-02-12 DIAGNOSIS — Z825 Family history of asthma and other chronic lower respiratory diseases: Secondary | ICD-10-CM | POA: Diagnosis not present

## 2022-02-12 DIAGNOSIS — I2510 Atherosclerotic heart disease of native coronary artery without angina pectoris: Secondary | ICD-10-CM | POA: Diagnosis not present

## 2022-02-12 DIAGNOSIS — Z8249 Family history of ischemic heart disease and other diseases of the circulatory system: Secondary | ICD-10-CM | POA: Diagnosis not present

## 2022-02-12 DIAGNOSIS — I700 Atherosclerosis of aorta: Secondary | ICD-10-CM | POA: Diagnosis not present

## 2022-02-12 DIAGNOSIS — Z888 Allergy status to other drugs, medicaments and biological substances status: Secondary | ICD-10-CM | POA: Diagnosis not present

## 2022-02-12 LAB — URINALYSIS, ROUTINE W REFLEX MICROSCOPIC
Bilirubin Urine: NEGATIVE
Glucose, UA: NEGATIVE mg/dL
Hgb urine dipstick: NEGATIVE
Ketones, ur: 5 mg/dL — AB
Leukocytes,Ua: NEGATIVE
Nitrite: NEGATIVE
Protein, ur: NEGATIVE mg/dL
Specific Gravity, Urine: 1.016 (ref 1.005–1.030)
pH: 5 (ref 5.0–8.0)

## 2022-02-12 LAB — COMPREHENSIVE METABOLIC PANEL
ALT: 33 U/L (ref 0–44)
AST: 29 U/L (ref 15–41)
Albumin: 3.6 g/dL (ref 3.5–5.0)
Alkaline Phosphatase: 72 U/L (ref 38–126)
Anion gap: 9 (ref 5–15)
BUN: 13 mg/dL (ref 8–23)
CO2: 25 mmol/L (ref 22–32)
Calcium: 9.3 mg/dL (ref 8.9–10.3)
Chloride: 104 mmol/L (ref 98–111)
Creatinine, Ser: 0.73 mg/dL (ref 0.44–1.00)
GFR, Estimated: >60 mL/min (ref >60)
Glucose, Bld: 262 mg/dL — ABNORMAL HIGH (ref 70–99)
Potassium: 4.5 mmol/L (ref 3.5–5.1)
Sodium: 138 mmol/L (ref 135–145)
Total Bilirubin: 0.4 mg/dL (ref 0.3–1.2)
Total Protein: 7.1 g/dL (ref 6.5–8.1)

## 2022-02-12 LAB — CBC
HCT: 49.8 % — ABNORMAL HIGH (ref 36.0–46.0)
Hemoglobin: 16.6 g/dL — ABNORMAL HIGH (ref 12.0–15.0)
MCH: 29 pg (ref 26.0–34.0)
MCHC: 33.3 g/dL (ref 30.0–36.0)
MCV: 87.1 fL (ref 80.0–100.0)
Platelets: 276 10*3/uL (ref 150–400)
RBC: 5.72 MIL/uL — ABNORMAL HIGH (ref 3.87–5.11)
RDW: 14.2 % (ref 11.5–15.5)
WBC: 14.7 10*3/uL — ABNORMAL HIGH (ref 4.0–10.5)
nRBC: 0 % (ref 0.0–0.2)

## 2022-02-12 LAB — CBG MONITORING, ED
Glucose-Capillary: 197 mg/dL — ABNORMAL HIGH (ref 70–99)
Glucose-Capillary: 292 mg/dL — ABNORMAL HIGH (ref 70–99)
Glucose-Capillary: 301 mg/dL — ABNORMAL HIGH (ref 70–99)
Glucose-Capillary: 294 mg/dL — ABNORMAL HIGH (ref 70–99)
Glucose-Capillary: 244 mg/dL — ABNORMAL HIGH (ref 70–99)
Glucose-Capillary: 284 mg/dL — ABNORMAL HIGH (ref 70–99)

## 2022-02-12 LAB — PHOSPHORUS: Phosphorus: 3.3 mg/dL (ref 2.5–4.6)

## 2022-02-12 LAB — RAPID URINE DRUG SCREEN, HOSP PERFORMED
Amphetamines: NOT DETECTED
Barbiturates: NOT DETECTED
Benzodiazepines: NOT DETECTED
Cocaine: NOT DETECTED
Opiates: NOT DETECTED
Tetrahydrocannabinol: NOT DETECTED

## 2022-02-12 LAB — MAGNESIUM: Magnesium: 2 mg/dL (ref 1.7–2.4)

## 2022-02-12 LAB — HEMOGLOBIN A1C
Hgb A1c MFr Bld: 8 % — ABNORMAL HIGH (ref 4.8–5.6)
Mean Plasma Glucose: 183 mg/dL

## 2022-02-12 LAB — HIV ANTIBODY (ROUTINE TESTING W REFLEX): HIV Screen 4th Generation wRfx: NONREACTIVE

## 2022-02-12 MED ORDER — INSULIN ASPART 100 UNIT/ML IJ SOLN
0.0000 [IU] | Freq: Three times a day (TID) | INTRAMUSCULAR | Status: DC
  Administered 2022-02-12: 11 [IU] via SUBCUTANEOUS
  Filled 2022-02-12: qty 1

## 2022-02-12 MED ORDER — INSULIN ASPART 100 UNIT/ML IJ SOLN
0.0000 [IU] | Freq: Every day | INTRAMUSCULAR | Status: DC
  Administered 2022-02-12: 3 [IU] via SUBCUTANEOUS
  Filled 2022-02-12: qty 1

## 2022-02-12 MED ORDER — LEVETIRACETAM 500 MG PO TABS
500.0000 mg | ORAL_TABLET | Freq: Two times a day (BID) | ORAL | Status: DC
  Administered 2022-02-12 – 2022-02-13 (×3): 500 mg via ORAL
  Filled 2022-02-12 (×3): qty 1

## 2022-02-12 MED ORDER — ONDANSETRON HCL 4 MG/2ML IJ SOLN: 4.0000 mg | Freq: Four times a day (QID) | INTRAMUSCULAR | Status: DC | PRN

## 2022-02-12 MED ORDER — ACETAMINOPHEN 650 MG RE SUPP: 650.0000 mg | Freq: Four times a day (QID) | RECTAL | Status: DC | PRN

## 2022-02-12 MED ORDER — ONDANSETRON HCL 4 MG PO TABS: 4.0000 mg | ORAL_TABLET | Freq: Four times a day (QID) | ORAL | Status: DC | PRN

## 2022-02-12 MED ORDER — ACETAMINOPHEN 325 MG PO TABS
650.0000 mg | ORAL_TABLET | Freq: Four times a day (QID) | ORAL | Status: DC | PRN
  Administered 2022-02-12: 650 mg via ORAL
  Filled 2022-02-12: qty 2

## 2022-02-12 MED ORDER — INSULIN ASPART 100 UNIT/ML IJ SOLN
0.0000 [IU] | Freq: Three times a day (TID) | INTRAMUSCULAR | Status: DC
  Administered 2022-02-12: 11 [IU] via SUBCUTANEOUS
  Administered 2022-02-12: 7 [IU] via SUBCUTANEOUS
  Administered 2022-02-13: 11 [IU] via SUBCUTANEOUS
  Administered 2022-02-13: 7 [IU] via SUBCUTANEOUS
  Filled 2022-02-12 (×2): qty 1

## 2022-02-12 NOTE — Consult Note (Signed)
[facility] TeleNeurology Consult Services   Patient Name:   Carolyn, Hooper Date of Birth:   [birth year] Identification Number:   MRN - [id number] Date of Service:   02/11/2022 22:39:24  Diagnosis:       I61.9 - Intracerebral haemorrhage, unspecified  Impression: Pt presents after sudden onset dysarthria and expressive aphasia as well as an episode of focal shaking in the RUE. She is found to have large left frontal and right high cortical hemorrhage with significant surrounding edema. Would recommend MRI brain w/&w/out contrast as well as vascular imaging of the head and neck. Pt to be evaluated by neurosurgery.  Recommendation:  Diagnostic Studies:      Repeat CT head in first 8-12hrs      CTA head and neck with contrast  Laboratory Studies:       INR/PT       aPTT?       CBC  Medications:       Load with Keppra 1gm now.       Keppra 500mg  bid.  Nursing Recommendations:       Telemetry, IV Fluids?Avoid dextrose containing fluids, Maintain euglycemia       Head of bed 30 degrees       Neuro checks q1-2?hrs?during ICU stay       Once stable neuro checks q4?hrs       keep BP less than 140/90's with goal of 130/80s  Consultations:       Need Neurosurgery consultation?STAT       Recommend Speech therapy if failed dysphagia screen       Physical therapy/Occupational therapy  DVT Prophylaxis:       SCDs  Disposition:       Neurology will Follow  ------------------------------------------------------------------------------  Metrics: Last Known Well: 02/11/2022 21:00:25 [facility] Notification Time: 02/11/2022 65:99:35 Arrival Time: 02/11/2022 22:30:54 Stamp Time: 02/11/2022 70:17:79 Initial Response Time: 02/11/2022 22:47:07 Symptoms: dysarthria and aphasia. Initial patient interaction: 02/11/2022 22:55:44 NIHSS Assessment Completed: 02/11/2022 23:11:48 Patient is not a candidate for Thrombolytic. Thrombolytic Medical Decision: 02/11/2022  23:11:48 Patient was not deemed candidate for Thrombolytic because of following reasons: Current or Previous ICH.  CT head showed no acute hemorrhage or acute core infarct. I personally Reviewed the CT Head and it Showed large left and small right hemorrhage with surrounding edema.  Primary Provider Notified of Diagnostic Impression and Management Plan on: 02/12/2022 00:07:22    History of Present Illness: Patient is a 61 year old Female.  Patient was brought by EMS for symptoms of dysarthria and aphasia. Pt reports she was getting ready to watch a TV show when suddenly she couldn't stop blinking. It was very rapid. So she took her reading glasses off and she felt as though she was having a seizure. Her RUE was drawn up tight, she couldn't speak at all, she felt like her muscles were seizing. Pt got her roommates attention but he doesn't speak english so he didn't understand. After some time (she can't say how much), she got him to help her to the restroom and it was hard for her to straighten up so she lay down on the bed until she was able to get to her phone to call for help. She couldn't remember what to dial when she did get there, couldn't recall 911. She couldn't dial with her RUE, she remembers her son's number so she just said "help". She was also able to say the word stroke so he called 911. Pt was saying the wrong  words upon arrival to the ER so she is much improved. Currently, pt is speaking very slowly but articulating without aphasia or dysarthria.    Past Medical History:      Diabetes Mellitus      Hyperlipidemia      There is no history of Hypertension      There is no history of Atrial Fibrillation      There is no history of Coronary Artery Disease      There is no history of Stroke      There is no history of Covid-19      There is no history of Seizures      There is no history of Migraine Headaches [place] PMH:  benzo dependence, unilateral vestibular schwannoma  causing vertigo  Medications:  No Anticoagulant use  No Antiplatelet use Reviewed EMR for current medications  Allergies:  NKDA  Social History: Smoking: Yes Alcohol Use: No Drug Use: No  Family History:  There is no family history of premature cerebrovascular disease pertinent to this consultation  ROS : 14 Points Review of Systems was performed and was negative except mentioned in HPI.  Past Surgical History: There Is No Surgical History Contributory To Today's Visit     Examination: BP(99/55), Pulse(80), Blood Glucose(184) 1A: Level of Consciousness - Alert; keenly responsive + 0 1B: Ask Month and Age - Both Questions Right + 0 1C: Blink Eyes & Squeeze Hands - Performs Both Tasks + 0 2: Test Horizontal Extraocular Movements - Normal + 0 3: Test Visual Fields - No Visual Loss + 0 4: Test Facial Palsy (Use Grimace if Obtunded) - Normal symmetry + 0 5A: Test Left Arm Motor Drift - No Drift for 10 Seconds + 0 5B: Test Right Arm Motor Drift - No Drift for 10 Seconds + 0 6A: Test Left Leg Motor Drift - No Drift for 5 Seconds + 0 6B: Test Right Leg Motor Drift - No Drift for 5 Seconds + 0 7: Test Limb Ataxia (FNF/Heel-Shin) - No Ataxia + 0 8: Test Sensation - Mild-Moderate Loss: Less Sharp/More Dull + 1 9: Test Language/Aphasia - Mild-Moderate Aphasia: Some Obvious Changes, Without Significant Limitation + 1 10: Test Dysarthria - Normal + 0 11: Test Extinction/Inattention - No abnormality + 0 NIHSS Score: 2  ICH Score: 0   GlasGow Coma Score: 13-15 (0)  Age >= 80: No (0)  ICH volume >= 61mL: No (0)  Intraventricular hemorrhage: No (0)  Infratentorial origin of hemorrhage: No (0)  Pre-Morbid Modified Rankin Scale: 0 Points = No symptoms at all   Patient/Family was informed the Neurology Consult would occur via TeleHealth consult by way of interactive audio and video telecommunications and consented to receiving care in this manner.   Dr Elzie Rings  [facility] For Inpatient follow-up with [facility] physician please call [facility] [phone]. This is not an outpatient service. Post hospital discharge, please contact hospital directly.

## 2022-02-12 NOTE — Progress Notes (Signed)
  Transition of Care [facility]) Screening Note   Patient Details  Name: Carolyn Hooper Date of Birth: [birth year]   Transition of Care [facility]) CM/SW Contact:    Ihor Gully, LCSW Phone Number: 02/12/2022, 1:44 PM    Transition of Care Department [facility]) has reviewed patient and no TOC needs have been identified at this time. We will continue to monitor patient advancement through interdisciplinary progression rounds. If new patient transition needs arise, please place a TOC consult.

## 2022-02-12 NOTE — H&P (Signed)
History and Physical    Patient: Carolyn Hooper:[id number] DOB: 1960/07/17 DOA: 02/11/2022 DOS: the patient was seen and examined on 02/12/2022 PCP: [name], FNP  Patient coming from: Home  Chief Complaint:  Chief Complaint  Patient presents with   Code Stroke   HPI: Carolyn Hooper is a 61 y.o. female with medical history significant of T2DM, right-sided schwannoma who presented to the emergency department via EMS due to sudden onset of speech difficulty.  Patient woke up around 9 PM and had difficulty in forming words, she also complaining of shaking in both arms with sensation of weakness with right being worse than left.  She endorsed difficulty in ambulation due to bilateral weakness of the legs (R > L), patient called the son who activated EMS, on arrival of EMS team, patient was noted to have slurred speech with difficulty in forming words.  Patient was taken to the ED for further evaluation and management.  Last known well was 8:33 PM.  ED Course:  In the emergency department, BP was soft at 99/55, but other vital signs were within normal range.  Workup in the ED showed WBC 12.6, hemoglobin 15.4, hematocrit 47.2, MCV 87.7, platelets 274.  BMP was normal except for blood glucose at 195.  Alcohol level was less than 10. CT head without contrast showed large area  of vasogenic edema in the left frontal/parietal lobe with overlying subarachnoid blood. Second, smaller lesion in the superior right frontal lobe. CT angiography head and neck showed no emergent LVO or high-grade stenosis of the intracranial arteries.  Unchanged appearance of area of vasogenic edema in the left hemisphere.  8 mm nodules in the right upper and middle lobes, likely metastasis. CT chest, abdomen and pelvis with contrast showed: 1. 5.4 cm mass in the medial left lower lobe concerning for malignancy. 2. Additional bilateral pulmonary nodules measuring up to 9 mm suspicious for metastases. 3. No evidence  of malignancy in the abdomen or pelvis. 4. 4.4 cm gallstone in the gallbladder neck. 5.  Aortic Atherosclerosis Teleneurologist was consulted and recommended MRI brain with and without contrast and to admit the patient.  Neurosurgery was consulted and recommended IV steroids and to admit patient to [facility] with goal to consult with patient on arrival to [facility]. Hospitalist was asked to admit patient for further evaluation and management.  Review of Systems: Review of systems as noted in the HPI. All other systems reviewed and are negative.   Past Medical History:  Diagnosis Date   Abscess    hydroadenitis superativa   Anxiety    Hyperinsulinemia    Neuroma    audiotry neuroma   Vertigo    Past Surgical History:  Procedure Laterality Date   ABDOMINAL HYSTERECTOMY  2006   remaining left ovary   SHOULDER SURGERY Right 1960s    Social History:  reports that she has been smoking cigarettes. She has been smoking an average of .75 packs per day. She has never used smokeless tobacco. She reports current alcohol use. She reports that she does not use drugs.   Allergies  Allergen Reactions   Adhesive [Tape] Dermatitis   Statins Other (See Comments)    Myalgia     Family History  Problem Relation Age of Onset   COPD Mother    Heart disease Father    Diabetes Father    Kidney disease Father    Arthritis Father        RA   Liver disease  Father      Prior to Admission medications   Medication Sig Start Date End Date Taking? Authorizing Provider  albuterol (VENTOLIN HFA) 108 (90 Base) MCG/ACT inhaler Inhale 2 puffs into the lungs every 6 (six) hours as needed. 01/08/21   [name], FNP  blood glucose meter kit and supplies Dispense based on patient and insurance preference. Use up to four times daily as directed. (FOR ICD-10 E10.9, E11.9). 09/21/19   Evelina Dun A, FNP  clonazePAM (KLONOPIN) 0.5 MG tablet Take 0.5 tablets (0.25 mg total) by mouth at bedtime. For anxiety  11/01/21   Evelina Dun A, FNP  metFORMIN (GLUCOPHAGE) 1000 MG tablet Take 1 tablet (1,000 mg total) by mouth 2 (two) times daily. 11/01/21   [name], FNP    Physical Exam: BP (!) 92/56   Pulse 83   Temp 97.6 F (36.4 C) (Oral)   Resp 15   SpO2 96%   General: 61 y.o. year-old female well developed well nourished in no acute distress.  Alert and oriented x3. HEENT: NCAT, EOMI Neck: Supple, trachea medial Cardiovascular: Regular rate and rhythm with no rubs or gallops.  No thyromegaly or JVD noted.  No lower extremity edema. 2/4 pulses in all 4 extremities. Respiratory: Clear to auscultation with no wheezes or rales. Good inspiratory effort. Abdomen: Soft, nontender nondistended with normal bowel sounds x4 quadrants. Muskuloskeletal: No cyanosis, clubbing or edema noted bilaterally Neuro: CN II-XII intact, expressive aphasia.  Strength 5/5 x 4, sensation, reflexes intact Skin: No ulcerative lesions noted or rashes Psychiatry: Judgement and insight appear normal. Mood is appropriate for condition and setting          Labs on Admission:  Basic Metabolic Panel: Recent Labs  Lab 02/11/22 2233 02/11/22 2240  NA 140 138  K 3.6 3.8  CL 102 105  CO2 25  --   GLUCOSE 195* 184*  BUN 13 14  CREATININE 0.76 0.70  CALCIUM 9.2  --    Liver Function Tests: Recent Labs  Lab 02/11/22 2233  AST 23  ALT 31  ALKPHOS 70  BILITOT 0.3  PROT 7.0  ALBUMIN 3.7   No results for input(s): "LIPASE", "AMYLASE" in the last 168 hours. No results for input(s): "AMMONIA" in the last 168 hours. CBC: Recent Labs  Lab 02/11/22 2233 02/11/22 2240  WBC 12.6*  --   NEUTROABS 8.6*  --   HGB 15.4* 15.6*  HCT 47.2* 46.0  MCV 87.7  --   PLT 274  --    Cardiac Enzymes: No results for input(s): "CKTOTAL", "CKMB", "CKMBINDEX", "TROPONINI" in the last 168 hours.  BNP (last 3 results) No results for input(s): "BNP" in the last 8760 hours.  ProBNP (last 3 results) No results for  input(s): "PROBNP" in the last 8760 hours.  CBG: No results for input(s): "GLUCAP" in the last 168 hours.  Radiological Exams on Admission: CT ANGIO HEAD NECK W WO CM  Result Date: 02/12/2022 CLINICAL DATA:  Stroke follow-up EXAM: CT ANGIOGRAPHY HEAD AND NECK TECHNIQUE: Multidetector CT imaging of the head and neck was performed using the standard protocol during bolus administration of intravenous contrast. Multiplanar CT image reconstructions and MIPs were obtained to evaluate the vascular anatomy. Carotid stenosis measurements (when applicable) are obtained utilizing NASCET criteria, using the distal internal carotid diameter as the denominator. RADIATION DOSE REDUCTION: This exam was performed according to the departmental dose-optimization program which includes automated exposure control, adjustment of the mA and/or kV according to patient size  and/or use of iterative reconstruction technique. CONTRAST:  118m OMNIPAQUE IOHEXOL 350 MG/ML SOLN COMPARISON:  Head CT 02/12/2019 FINDINGS: CTA NECK FINDINGS SKELETON: There is no bony spinal canal stenosis. No lytic or blastic lesion. OTHER NECK: Normal pharynx, larynx and major salivary glands. No cervical lymphadenopathy. Unremarkable thyroid gland. UPPER CHEST: 8 mm nodules in the right upper and middle lobes AORTIC ARCH: There is calcific atherosclerosis of the aortic arch. There is no aneurysm, dissection or hemodynamically significant stenosis of the visualized portion of the aorta. Conventional 3 vessel aortic branching pattern. The visualized proximal subclavian arteries are widely patent. RIGHT CAROTID SYSTEM: Normal without aneurysm, dissection or stenosis. LEFT CAROTID SYSTEM: Normal without aneurysm, dissection or stenosis. VERTEBRAL ARTERIES: Left dominant configuration. Both origins are clearly patent. There is no dissection, occlusion or flow-limiting stenosis to the skull base (V1-V3 segments). CTA HEAD FINDINGS Unchanged appearance of area  of vasogenic edema in the left hemisphere. POSTERIOR CIRCULATION: --Vertebral arteries: Normal V4 segments. --Inferior cerebellar arteries: Normal. --Basilar artery: Normal. --Superior cerebellar arteries: Normal. --Posterior cerebral arteries (PCA): Normal. ANTERIOR CIRCULATION: --Intracranial internal carotid arteries: Normal. --Anterior cerebral arteries (ACA): Normal. Both A1 segments are present. Patent anterior communicating artery (a-comm). --Middle cerebral arteries (MCA): Normal. VENOUS SINUSES: As permitted by contrast timing, patent. ANATOMIC VARIANTS: None Review of the MIP images confirms the above findings. IMPRESSION: 1. No emergent large vessel occlusion or high-grade stenosis of the intracranial arteries. 2. Unchanged appearance of area of vasogenic edema in the left hemisphere. 3. A 8 mm nodules in the right upper and middle lobes, likely metastases. This is more completely characterized on concomitant CT chest, abdomen and pelvis. Aortic atherosclerosis (ICD10-I70.0). Electronically Signed   By: KUlyses JarredM.D.   On: 02/12/2022 01:39   CT CHEST ABDOMEN PELVIS W CONTRAST  Result Date: 02/12/2022 CLINICAL DATA:  Brain tumor seen on CT query metastatic disease EXAM: CT CHEST, ABDOMEN, AND PELVIS WITH CONTRAST TECHNIQUE: Multidetector CT imaging of the chest, abdomen and pelvis was performed following the standard protocol during bolus administration of intravenous contrast. RADIATION DOSE REDUCTION: This exam was performed according to the departmental dose-optimization program which includes automated exposure control, adjustment of the mA and/or kV according to patient size and/or use of iterative reconstruction technique. CONTRAST:  1064mOMNIPAQUE IOHEXOL 350 MG/ML SOLN COMPARISON:  None Available. FINDINGS: CT CHEST FINDINGS Cardiovascular: Coronary artery calcifications. Aortic arch calcifications. Normal heart size. No pericardial effusion. Mediastinum/Nodes: No enlarged mediastinal,  hilar, or axillary lymph nodes. Thyroid gland, trachea, and esophagus demonstrate no significant findings. Lungs/Pleura: Numerous bilateral small pulmonary nodules with the largest measuring 9 mm in the lateral right upper lobe (4/41). Irregular mass along the left lower lobe medially measures 5.4 x 4.1 by 5.4 cm. This obstructs multiple segmental right lower lobe bronchi. No pleural effusion or pneumothorax. Musculoskeletal: No chest wall mass or suspicious bone lesions identified. CT ABDOMEN PELVIS FINDINGS Hepatobiliary: Large 4.4 cm gallstone in the gallbladder neck. No evidence of cholecystitis. No biliary dilation. No suspicious hepatic lesion. Pancreas: Unremarkable. Spleen: Unremarkable. Adrenals/Urinary Tract: Normal adrenal glands. No suspicious renal mass. No hydronephrosis or urinary calculi. Unremarkable bladder. Stomach/Bowel: Normal caliber large and small bowel. Colonic diverticulosis without diverticulitis. Normal appendix. Unremarkable stomach. Vascular/Lymphatic: Aortic athero sclerotic calcification. There are few prominent subcentimeter upper retroperitoneal nodes (for example precaval node measuring 8 mm in short axis (3/61). Reproductive: Hysterectomy.  No suspicious adnexal mass. Other: No free intraperitoneal fluid or air. Musculoskeletal: No acute osseous abnormality or destructive osseous lesion. Thoracolumbar spondylosis. Sclerosis  in the left femoral head has a benign appearance. IMPRESSION: 1. 5.4 cm mass in the medial left lower lobe concerning for malignancy. 2. Additional bilateral pulmonary nodules measuring up to 9 mm suspicious for metastases. 3. No evidence of malignancy in the abdomen or pelvis. 4. 4.4 cm gallstone in the gallbladder neck. 5.  Aortic Atherosclerosis (ICD10-I70.0). Electronically Signed   By: Placido Sou M.D.   On: 02/12/2022 01:31   CT HEAD CODE STROKE WO CONTRAST  Result Date: 02/11/2022 CLINICAL DATA:  Code stroke. EXAM: CT HEAD WITHOUT CONTRAST  TECHNIQUE: Contiguous axial images were obtained from the base of the skull through the vertex without intravenous contrast. RADIATION DOSE REDUCTION: This exam was performed according to the departmental dose-optimization program which includes automated exposure control, adjustment of the mA and/or kV according to patient size and/or use of iterative reconstruction technique. COMPARISON:  Brain MRI 01/25/2021 FINDINGS: Brain: There is a large area of vasogenic edema within the left frontal and parietal lobes. On the right, there is a punctate hyperdense focus with adjacent vasogenic edema at the superior paramedian right frontal lobe (3:30). No midline shift. Vascular: No abnormal hyperdensity of the major intracranial arteries or dural venous sinuses. No intracranial atherosclerosis. Skull: The visualized skull base, calvarium and extracranial soft tissues are normal. Sinuses/Orbits: No fluid levels or advanced mucosal thickening of the visualized paranasal sinuses. No mastoid or middle ear effusion. The orbits are normal. IMPRESSION: Large area of vasogenic edema in the left frontal/parietal lobe with overlying subarachnoid blood. Second, smaller lesion in the superior right frontal lobe. To gather appearance is concerning for metastatic disease. MRI of the brain with and without contrast is suggested. Critical Value/emergent results were called by telephone at the time of interpretation on 02/11/2022 at 10:53 pm to provider [name] TRIFAN , who verbally acknowledged these results. Electronically Signed   By: Ulyses Jarred M.D.   On: 02/11/2022 22:53    EKG: I independently viewed the EKG done and my findings are as followed: Normal sinus rhythm at a rate of 80 bpm  Assessment/Plan Present on Admission:  Uncontrolled type 2 diabetes mellitus with hyperglycemia, without long-term current use of insulin (HCC)  Principal Problem:   Subarachnoid hemorrhage (HCC) Active Problems:   Uncontrolled type 2  diabetes mellitus with hyperglycemia, without long-term current use of insulin (HCC)   Aphasia   Brain mass   Leukocytosis  Subarachnoid hemorrhage Aphasia Brain mass Patient with vasogenic edema in the left frontal/parietal lobe with overlying subarachnoid blood who now presents with aphasia and 5.4 cm mass in the medial left lower lobe concerning for malignancy, as well as bilateral pulmonary nodules suspected to be metastasis. MRI brain with and without contrast in the morning per teleneurologist recommendation Vascular imaging of the head and neck recommended by the neurologist showed no LVO. Neurosurgery recommended IV steroids and patient was treated with IV Decadron, it was recommended for patient to be admitted to [facility] for further evaluation by the neurosurgical team per EDP. Inpatient teleneurology will be consulted in the morning and we shall await further recommendation.  Leukocytosis possibly reactive WBC 12.6, no suspicion for acute infectious process at this time Continue to monitor WBC with morning labs  Type 2 diabetes mellitus with hyperglycemia Hemoglobin A1c on 11/01/2021 was 7.0 Continue ISS and hypoglycemia protocol Metformin will be held at this time   DVT prophylaxis: SCDs  Code Status: Full code  Family Communication: Son at bedside (all questions answered to satisfaction)  Consults: Teleneurology, neurosurgery  Severity of Illness: The appropriate patient status for this patient is OBSERVATION. Observation status is judged to be reasonable and necessary in order to provide the required intensity of service to ensure the patient's safety. The patient's presenting symptoms, physical exam findings, and initial radiographic and laboratory data in the context of their medical condition is felt to place them at decreased risk for further clinical deterioration. Furthermore, it is anticipated that the patient will be medically stable for discharge from the  hospital within 2 midnights of admission.   Author: Bernadette Hoit, DO 02/12/2022 2:57 AM  For on call review [email or web address].

## 2022-02-12 NOTE — ED Notes (Signed)
Lunch tray given to patient

## 2022-02-12 NOTE — ED Notes (Signed)
Breakfast tray given to patient.

## 2022-02-12 NOTE — Progress Notes (Addendum)
Patient admitted to the hospital earlier this morning by Dr. Josephine Cables  Patient seen and examined.  Her son was in the room.  They both feel that her aphasia is better since she has been started on steroids.  Her strength is equal in her legs bilaterally.  Grip strength mildly decreased in right hand.  She does not have any facial asymmetry at this time.  She is still having some trouble with aphasia and finding her words, but overall she appears to be significantly improved since symptom onset.  She is a smoker.  She has a chronic cough and her son feels this has been worsening over the last few months.  She also endorses an unintentional weight loss of 35 to 40 pounds in the past 6 months.  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 Hortense Ramal, and will start on keppra 500mg  bid for seizure prophylaxis   Lung mass -CT of chest, abdomen pelvis performed for staging -No disease noted in abdomen, but there was comment of left lower lobe lung mass as well as smaller bilateral pulmonary deposits -Will likely need further workup with biopsy  Type 2 diabetes, uncontrolled with hyperglycemia -She is chronically on metformin -Currently blood sugars more elevated in the setting of steroids -Continue resistant scale SSI -Monitor trend  Goals of care -Discussed CODE STATUS with the patient in the presence of her son -She did not express  that she had considered DNR status in the past.  After discussing with her son during my visit, she wishes to pursue full code for now, pending further workup of her current disease -I have offered palliative care consultation to follow along to help readdress goals as further data regarding her prognosis becomes available.  She reports that she is familiar with palliative care and would be agreeable for them to follow along at [place]

## 2022-02-12 NOTE — ED Notes (Signed)
Sitting up eating dinner. No complaints. Neuro check WNL.

## 2022-02-12 NOTE — ED Notes (Signed)
Asked pt about purewic and bedpan she isn't sure what she will feel comfortable with.

## 2022-02-12 NOTE — ED Notes (Signed)
Still no urine at this time

## 2022-02-12 NOTE — Progress Notes (Signed)
Code stroke  Call time  2220 Ems in route Beeper time  No beeper Exam start  2230 Exam end  2246 Epic   2246 SOc   2246  [place]  2247

## 2022-02-12 NOTE — ED Notes (Signed)
No urine at this time

## 2022-02-12 NOTE — ED Provider Notes (Incomplete)
[facility] EMERGENCY DEPARTMENT Provider Note   CSN: [id number] Arrival date & time: 02/11/22  2230     History {Add pertinent medical, surgical, social history, OB history to HPI:1} No chief complaint on file.   Carolyn Hooper is a 61 y.o. female w/ hx of diabetes here with difficulty speaking, onset around 9 pm this evening, she called her son at that time feeling unwell.  Reports she has had worsening nausea and dizziness since her ambulance ride into the ED.  Denies prior hx of stroke.  She does have a history of a right-sided schwannoma noted on MRI of her brain approximate 1 year ago November 2022.  HPI     Home Medications Prior to Admission medications   Medication Sig Start Date End Date Taking? Authorizing Provider  albuterol (VENTOLIN HFA) 108 (90 Base) MCG/ACT inhaler Inhale 2 puffs into the lungs every 6 (six) hours as needed. 01/08/21   [name], FNP  blood glucose meter kit and supplies Dispense based on patient and insurance preference. Use up to four times daily as directed. (FOR ICD-10 E10.9, E11.9). 09/21/19   Evelina Dun A, FNP  clonazePAM (KLONOPIN) 0.5 MG tablet Take 0.5 tablets (0.25 mg total) by mouth at bedtime. For anxiety 11/01/21   Evelina Dun A, FNP  metFORMIN (GLUCOPHAGE) 1000 MG tablet Take 1 tablet (1,000 mg total) by mouth 2 (two) times daily. 11/01/21   [name], FNP      Allergies    Adhesive [tape] and Statins    Review of Systems   Review of Systems  Physical Exam Updated Vital Signs There were no vitals taken for this visit. Physical Exam Constitutional:      General: She is not in acute distress. HENT:     Head: Normocephalic and atraumatic.  Eyes:     Conjunctiva/sclera: Conjunctivae normal.     Pupils: Pupils are equal, round, and reactive to light.  Cardiovascular:     Rate and Rhythm: Normal rate and regular rhythm.  Pulmonary:     Effort: Pulmonary effort is normal. No respiratory distress.  Abdominal:      General: There is no distension.     Tenderness: There is no abdominal tenderness.  Skin:    General: Skin is warm and dry.  Neurological:     Mental Status: She is alert and oriented to person, place, and time. Mental status is at baseline.     Comments: Expressive aphasia, normal strength testing in all extremities, sensation in tact  Psychiatric:        Mood and Affect: Mood normal.        Behavior: Behavior normal.     ED Results / Procedures / Treatments   Labs (all labs ordered are listed, but only abnormal results are displayed) Labs Reviewed - No data to display  EKG None  Radiology No results found.  Procedures Procedures  {Document cardiac monitor, telemetry assessment procedure when appropriate:1}  Medications Ordered in ED Medications - No data to display  ED Course/ Medical Decision Making/ A&P Clinical Course as of 02/12/22 0003  Mon Feb 11, 2022  2235 Code stroke, LKW approx 9 pm this evening, expressive aphasia, accucheck ~200, at CT now.  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  I suspect this is the likely etiology of her speech difficulty, particularly the parietal mass.  I will discuss case with Neurosurgery, anticipating medical admission. [MT]  2346 Neurologist  recommending MRI brain w/ and w/o contrast, admission.  Neurosurgery recommending IV steroids, admission to [facility].  Will need transfer.  No available beds at this time.  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 [MT]    Clinical Course User Index [MT] Kishaun Erekson, Carola Rhine, MD                           Medical Decision Making Amount and/or Complexity of Data Reviewed Labs: ordered. Radiology: ordered.  Risk Prescription drug management. Decision regarding hospitalization.   This patient presents to the ED with concern for aphasia, vertigo. This involves an extensive number of treatment options,  and is a complaint that carries with it a high risk of complications and morbidity.  The differential diagnosis includes CVA vs other  Co-morbidities that complicate the patient evaluation: diabetes risk factor for CVA  Additional history obtained from EMS  I ordered and personally interpreted labs.  The pertinent results include:  ***  I ordered imaging studies including CT head I independently visualized and interpreted imaging which showed *** I agree with the radiologist interpretation  The patient was maintained on a cardiac monitor.  I personally viewed and interpreted the cardiac monitored which showed an underlying rhythm of: ***  Per my interpretation the patient's ECG shows ***  I ordered medication including ***  for *** I have reviewed the patients home medicines and have made adjustments as needed  Test Considered: ***  I requested consultation with the neurology,  and discussed lab and imaging findings as well as pertinent plan - they recommend: ***  After the interventions noted above, I reevaluated the patient and found that they have: {resolved/improved/worsened:23923::"improved"}  Social Determinants of Health:***  Dispostion:  After consideration of the diagnostic results and the patients response to treatment, I feel that the patent would benefit from ***.   {Document critical care time when appropriate:1} {Document review of labs and clinical decision tools ie heart score, Chads2Vasc2 etc:1}  {Document your independent review of radiology images, and any outside records:1} {Document your discussion with family members, caretakers, and with consultants:1} {Document social determinants of health affecting pt's care:1} {Document your decision making why or why not admission, treatments were needed:1} Final Clinical Impression(s) / ED Diagnoses Final diagnoses:  None    Rx / DC Orders ED Discharge Orders     None

## 2022-02-12 NOTE — Inpatient Diabetes Management (Signed)
Inpatient Diabetes Program Recommendations  AACE/ADA: New Consensus Statement on Inpatient Glycemic Control   Target Ranges:  Prepandial:   less than 140 mg/dL      Peak postprandial:   less than 180 mg/dL (1-2 hours)      Critically ill patients:  140 - 180 mg/dL    Latest Reference Range & Units 02/11/22 22:30 02/12/22 06:37 02/12/22 07:41  Glucose-Capillary 70 - 99 mg/dL 197 (H) 292 (H) 301 (H)    Latest Reference Range & Units 02/11/22 22:33  Glucose 70 - 99 mg/dL 195 (H)   Review of Glycemic Control  Diabetes history: DM2 Outpatient Diabetes medications: Metformin 1000 mg BID Current orders for Inpatient glycemic control: Novolog 0-15 units TID with meals; Decadron 4 mg Q6H   NOTE: Patient currently in the ED and being admitted with subarachnoid hemorrhage, aphasia, brain mass.  Patient is ordered Decadron which is contributing to hyperglycemia. Glucose 301 mg/dl today at 7:41 am. Per MAR, Novolog correction for 8am has not been given yet. Sent communication to [employer], Management consultant.   Thanks, Barnie Alderman, RN, MSN, [place] Diabetes Coordinator Inpatient Diabetes Program [phone] (Team Pager from 8am to [place])

## 2022-02-12 NOTE — ED Notes (Signed)
Pure wick canister changed out. Patient put out 978mls. Patient also asking about medication and a diet order stating "I haven't ate since yesterday around 7 pm". Nurse notified.

## 2022-02-13 ENCOUNTER — Other Ambulatory Visit: Payer: Self-pay | Admitting: Pulmonary Disease

## 2022-02-13 ENCOUNTER — Other Ambulatory Visit (HOSPITAL_COMMUNITY): Payer: Self-pay

## 2022-02-13 ENCOUNTER — Inpatient Hospital Stay (HOSPITAL_COMMUNITY): Payer: BC Managed Care – PPO

## 2022-02-13 ENCOUNTER — Encounter: Payer: Self-pay | Admitting: Pulmonary Disease

## 2022-02-13 DIAGNOSIS — R918 Other nonspecific abnormal finding of lung field: Secondary | ICD-10-CM | POA: Diagnosis not present

## 2022-02-13 DIAGNOSIS — I609 Nontraumatic subarachnoid hemorrhage, unspecified: Secondary | ICD-10-CM | POA: Diagnosis not present

## 2022-02-13 LAB — CBC
HCT: 46.2 % — ABNORMAL HIGH (ref 36.0–46.0)
Hemoglobin: 15.4 g/dL — ABNORMAL HIGH (ref 12.0–15.0)
MCH: 29 pg (ref 26.0–34.0)
MCHC: 33.3 g/dL (ref 30.0–36.0)
MCV: 87 fL (ref 80.0–100.0)
Platelets: 292 10*3/uL (ref 150–400)
RBC: 5.31 MIL/uL — ABNORMAL HIGH (ref 3.87–5.11)
RDW: 14.1 % (ref 11.5–15.5)
WBC: 16.4 10*3/uL — ABNORMAL HIGH (ref 4.0–10.5)
nRBC: 0 % (ref 0.0–0.2)

## 2022-02-13 LAB — GLUCOSE, CAPILLARY
Glucose-Capillary: 248 mg/dL — ABNORMAL HIGH (ref 70–99)
Glucose-Capillary: 255 mg/dL — ABNORMAL HIGH (ref 70–99)
Glucose-Capillary: 236 mg/dL — ABNORMAL HIGH (ref 70–99)
Glucose-Capillary: 174 mg/dL — ABNORMAL HIGH (ref 70–99)

## 2022-02-13 LAB — BASIC METABOLIC PANEL
Anion gap: 9 (ref 5–15)
BUN: 14 mg/dL (ref 8–23)
CO2: 24 mmol/L (ref 22–32)
Calcium: 9.3 mg/dL (ref 8.9–10.3)
Chloride: 103 mmol/L (ref 98–111)
Creatinine, Ser: 0.77 mg/dL (ref 0.44–1.00)
GFR, Estimated: >60 mL/min (ref >60)
Glucose, Bld: 247 mg/dL — ABNORMAL HIGH (ref 70–99)
Potassium: 4 mmol/L (ref 3.5–5.1)
Sodium: 136 mmol/L (ref 135–145)

## 2022-02-13 MED ORDER — LEVETIRACETAM 500 MG PO TABS
500.0000 mg | ORAL_TABLET | Freq: Two times a day (BID) | ORAL | 0 refills | Status: DC
  Filled 2022-02-13: qty 60, 30d supply, fill #0

## 2022-02-13 MED ORDER — ONDANSETRON HCL 4 MG PO TABS
4.0000 mg | ORAL_TABLET | Freq: Four times a day (QID) | ORAL | 0 refills | Status: DC | PRN
  Filled 2022-02-13: qty 20, 5d supply, fill #0

## 2022-02-13 MED ORDER — DIAZEPAM 5 MG PO TABS
5.0000 mg | ORAL_TABLET | Freq: Once | ORAL | Status: AC
  Administered 2022-02-13: 5 mg via ORAL
  Filled 2022-02-13: qty 1

## 2022-02-13 MED ORDER — GADOBUTROL 1 MMOL/ML IV SOLN
10.0000 mL | Freq: Once | INTRAVENOUS | Status: AC | PRN
  Administered 2022-02-13: 10 mL via INTRAVENOUS

## 2022-02-13 MED ORDER — DEXAMETHASONE 4 MG PO TABS
4.0000 mg | ORAL_TABLET | Freq: Two times a day (BID) | ORAL | 0 refills | Status: DC
  Filled 2022-02-13: qty 28, 14d supply, fill #0

## 2022-02-13 NOTE — Plan of Care (Signed)

## 2022-02-13 NOTE — TOC Transition Note (Signed)
Transition of Care [facility]) - CM/SW Discharge Note   Patient Details  Name: JONITA HIROTA MRN: [id number] Date of Birth: 06/07/1960  [facility]) CM/SW Contact:  Pollie Friar, RN Phone Number: 02/13/2022, 2:57 PM   Clinical Narrative:    Pt is discharging home with self care. Pt to f/u for biopsy as outpatient.  TOC following.   Final next level of care: Home/Self Care Barriers to Discharge: No Barriers Identified   Patient Goals and CMS Choice        Discharge Placement                       Discharge Plan and Services                                     Social Determinants of Health (SDOH) Interventions     Readmission Risk Interventions     No data to display

## 2022-02-13 NOTE — H&P (View-Only) (Signed)
NAME:  Carolyn Hooper, MRN:  [id number], DOB:  1960-11-19, LOS: 1 ADMISSION DATE:  02/11/2022, CONSULTATION DATE: 02/13/2022 REFERRING MD: [facility], CHIEF COMPLAINT: Lung mass  History of Present Illness:  61 year old female lifelong smoker, he was diagnosed with a right ear schwannoma approximate 11 months ago with other than vertigo no issues.  She awoke with right-sided weakness and expressive aphasia admitted to [facility] for evaluation and treatment.  CT scan showed left hemisphere vasogenic edema questionable metastases with CT of the chest showing left 5.4 cm mass with multiple nodules.  Pulmonary critical care has been asked to evaluate for possible navigational bronchoscopy for tissue biopsy.  CT scan is to be reviewed by pulmonologist with further recommendations to follow.  Pertinent  Medical History   Past Medical History:  Diagnosis Date   Abscess    hydroadenitis superativa   Anxiety    Hyperinsulinemia    Neuroma    audiotry neuroma   Vertigo      Significant Hospital Events: Including procedures, antibiotic start and stop dates in addition to other pertinent events     Interim History / Subjective:  Transferred from [facility] for neurology evaluation and possible bronchoscopy for tissue biopsy  Objective   Blood pressure (!) 115/51, pulse 72, temperature 98.2 F (36.8 C), resp. rate 18, height _0  (1.727 m), weight 113.4 kg, SpO2 95 %.        Intake/Output Summary (Last 24 hours) at 02/13/2022 0931 Last data filed at 02/12/2022 1525 Gross per 24 hour  Intake --  Output 601 ml  Net -601 ml   Filed Weights   02/12/22 0900  Weight: 113.4 kg    Examination: General: Awake and alert no acute distress HENT: No JVD is appreciated Lungs: Decreased breath sounds in the bases Cardiovascular: Heart sounds are regular Abdomen: Obese soft nontender Extremities: Warm and dry Neuro: Continues to have expressive aphasia strength is increased  in the right side GU: Voids  Resolved Hospital Problem list     Assessment & Plan:  61 year old female pack-a-day smoker who has a history of right schwannoma from approximately 11 months ago.  Who now presents with waking up with right-sided weakness, expressive aphasia CT of the head with vasogenic edema and CT of the chest with right upper lobe nodule and was 5.4 cm left mass consistent with metastatic cancer.  Pulmonary critical care asked to evaluate for possible navigation bronchoscopy for tissue biopsy. 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 Per neuro  Steroid exacerbation of diabetes CBG (last 3)  Recent Labs    02/12/22 2130 02/13/22 0311 02/13/22 0602  GLUCAP 284* 248* 255*   Sliding-scale insulin protocol  Best Practice (right click and "Reselect all SmartList Selections" daily)   Diet/type: Regular consistency (see orders) DVT prophylaxis: not indicated GI prophylaxis: PPI Lines: N/A Foley:  N/A Code Status:  full code Last date of multidisciplinary goals of care discussion [tbd]  Labs   CBC: Recent Labs  Lab 02/11/22 2233 02/11/22 2240 02/12/22 0446  WBC 12.6*  --  14.7*  NEUTROABS 8.6*  --   --   HGB 15.4* 15.6* 16.6*  HCT 47.2* 46.0 49.8*  MCV 87.7  --  87.1  PLT 274  --  935    Basic Metabolic Panel: Recent Labs  Lab  02/11/22 2233 02/11/22 2240 02/12/22 0446  NA 140 138 138  K 3.6 3.8 4.5  CL 102 105 104  CO2 25  --  25  GLUCOSE 195* 184* 262*  BUN _0 CREATININE 0.76 0.70 0.73  CALCIUM 9.2  --  9.3  MG  --   --  2.0  PHOS  --   --  3.3   GFR: Estimated Creatinine Clearance: 96.3 mL/min (by C-G formula based on SCr of 0.73 mg/dL). Recent Labs  Lab 02/11/22 2233 02/12/22 0446  WBC 12.6* 14.7*    Liver Function Tests: Recent  Labs  Lab 02/11/22 2233 02/12/22 0446  AST 23 29  ALT 31 33  ALKPHOS 70 72  BILITOT 0.3 0.4  PROT 7.0 7.1  ALBUMIN 3.7 3.6   No results for input(s): "LIPASE", "AMYLASE" in the last 168 hours. No results for input(s): "AMMONIA" in the last 168 hours.  ABG    Component Value Date/Time   TCO2 24 02/11/2022 2240     Coagulation Profile: Recent Labs  Lab 02/11/22 2233  INR 0.9    Cardiac Enzymes: No results for input(s): "CKTOTAL", "CKMB", "CKMBINDEX", "TROPONINI" in the last 168 hours.  HbA1C: HB A1C (BAYER DCA - WAIVED)  Date/Time Value Ref Range Status  11/01/2021 10:07 AM 7.0 (H) 4.8 - 5.6 % Final    Comment:             Prediabetes: 5.7 - 6.4          Diabetes: >6.4          Glycemic control for adults with diabetes: <7.0   07/05/2020 09:00 AM 7.5 (H) <7.0 % Final    Comment:                                          Diabetic Adult            <7.0                                       Healthy Adult        4.3 - 5.7                                                           (DCCT/NGSP) American Diabetes Association's Summary of Glycemic Recommendations for Adults with Diabetes: Hemoglobin A1c <7.0%. More stringent glycemic goals (A1c <6.0%) may further reduce complications at the cost of increased risk of hypoglycemia.    Hgb A1c MFr Bld  Date/Time Value Ref Range Status  02/12/2022 04:46 AM 8.0 (H) 4.8 - 5.6 % Final    Comment:    (NOTE)         Prediabetes: 5.7 - 6.4         Diabetes: >6.4         Glycemic control for adults with diabetes: <7.0     CBG: Recent Labs  Lab 02/12/22 1237 02/12/22 1657 02/12/22 2130 02/13/22 0311 02/13/22 0602  GLUCAP 294* 244* 284* 248* 255*    Review of Systems:   10 point review of system taken, please see HPI  for positives and negatives. Positive for right-sided weakness Positive for aphasia Positive for seizure activity  Past Medical History:  She,  has a past medical history of Abscess, Anxiety,  Hyperinsulinemia, Neuroma, and Vertigo.   Surgical History:   Past Surgical History:  Procedure Laterality Date   ABDOMINAL HYSTERECTOMY  2006   remaining left ovary   SHOULDER SURGERY Right 1960s     Social History:   reports that she has been smoking cigarettes. She has been smoking an average of .75 packs per day. She has never used smokeless tobacco. She reports current alcohol use. She reports that she does not use drugs.   Family History:  Her family history includes Arthritis in her father; COPD in her mother; Diabetes in her father; Heart disease in her father; Kidney disease in her father; Liver disease in her father.   Allergies Allergies  Allergen Reactions   Adhesive [Tape] Dermatitis   Statins Other (See Comments)    Myalgia      Home Medications  Prior to Admission medications   Medication Sig Start Date End Date Taking? Authorizing Provider  albuterol (VENTOLIN HFA) 108 (90 Base) MCG/ACT inhaler Inhale 2 puffs into the lungs every 6 (six) hours as needed. 01/08/21  Yes Hawks, [name] A, FNP  clonazePAM (KLONOPIN) 0.5 MG tablet Take 0.5 tablets (0.25 mg total) by mouth at bedtime. For anxiety 11/01/21  Yes Hawks, [name] A, FNP  metFORMIN (GLUCOPHAGE) 1000 MG tablet Take 1 tablet (1,000 mg total) by mouth 2 (two) times daily. 11/01/21  Yes Hawks, Theador Hawthorne, FNP  blood glucose meter kit and supplies Dispense based on patient and insurance preference. Use up to four times daily as directed. (FOR ICD-10 E10.9, E11.9). 09/21/19   [name], FNP     Critical care time: Ferol Luz Kairi Tufo ACNP Acute Care Nurse Practitioner [employer] Please consult Amion 02/13/2022, 9:31 AM

## 2022-02-13 NOTE — Consult Note (Signed)
Carolyn Hooper is an 61 y.o. female.   Chief Complaint: Metastatic lung cancer to brain HPI: 61 year old smoker presents with word finding difficulties and some unsteadiness.  Workup demonstrated evidence of left posterior frontal edema with likely metastatic tumor associated.  Patient also with likely a deposit in the right temporal lobe.  She has undergone further evaluation which demonstrates evidence for large left lung mass with some possible metastatic disease and not adenopathy.  Patient has improved on steroids.  She has been placed on Keppra.  Plan is for her to go home and come back next week for a robot guided biopsy of her lung mass to establish tissue diagnosis.  MRI scan of brain is pending.  Past Medical History:  Diagnosis Date   Abscess    hydroadenitis superativa   Anxiety    Hyperinsulinemia    Neuroma    audiotry neuroma   Vertigo     Past Surgical History:  Procedure Laterality Date   ABDOMINAL HYSTERECTOMY  2006   remaining left ovary   SHOULDER SURGERY Right 8s    Family History  Problem Relation Age of Onset   COPD Mother    Heart disease Father    Diabetes Father    Kidney disease Father    Arthritis Father        RA   Liver disease Father    Social History:  reports that she has been smoking cigarettes. She has been smoking an average of .75 packs per day. She has never used smokeless tobacco. She reports current alcohol use. She reports that she does not use drugs.  Allergies:  Allergies  Allergen Reactions   Adhesive [Tape] Dermatitis   Statins Other (See Comments)    Myalgia     Medications Prior to Admission  Medication Sig Dispense Refill   albuterol (VENTOLIN HFA) 108 (90 Base) MCG/ACT inhaler Inhale 2 puffs into the lungs every 6 (six) hours as needed. 18 g 2   clonazePAM (KLONOPIN) 0.5 MG tablet Take 0.5 tablets (0.25 mg total) by mouth at bedtime. For anxiety 30 tablet 2   metFORMIN (GLUCOPHAGE) 1000 MG tablet Take 1 tablet (1,000 mg  total) by mouth 2 (two) times daily. 180 tablet 1   blood glucose meter kit and supplies Dispense based on patient and insurance preference. Use up to four times daily as directed. (FOR ICD-10 E10.9, E11.9). 1 each 0    Results for orders placed or performed during the hospital encounter of 02/11/22 (from the past 48 hour(s))  CBG monitoring, ED     Status: Abnormal   Collection Time: 02/11/22 10:30 PM  Result Value Ref Range   Glucose-Capillary 197 (H) 70 - 99 mg/dL    Comment: Glucose reference range applies only to samples taken after fasting for at least 8 hours.  Ethanol     Status: None   Collection Time: 02/11/22 10:33 PM  Result Value Ref Range   Alcohol, Ethyl (B) <10 <10 mg/dL    Comment: (NOTE) Lowest detectable limit for serum alcohol is 10 mg/dL.  For medical purposes only. Performed at [facility], [address] [place], [place] [address]   Protime-INR     Status: None   Collection Time: 02/11/22 10:33 PM  Result Value Ref Range   Prothrombin Time 12.3 11.4 - 15.2 seconds   INR 0.9 0.8 - 1.2    Comment: (NOTE) INR goal varies based on device and disease states. Performed at [facility], [address] [place] [address], [place],  [place] [address]   APTT     Status: None   Collection Time: 02/11/22 10:33 PM  Result Value Ref Range   aPTT 27 24 - 36 seconds    Comment: Performed at [facility], [address] [place], [place] [address]  CBC     Status: Abnormal   Collection Time: 02/11/22 10:33 PM  Result Value Ref Range   WBC 12.6 (H) 4.0 - 10.5 K/uL   RBC 5.38 (H) 3.87 - 5.11 MIL/uL   Hemoglobin 15.4 (H) 12.0 - 15.0 g/dL   HCT 47.2 (H) 36.0 - 46.0 %   MCV 87.7 80.0 - 100.0 fL   MCH 28.6 26.0 - 34.0 pg   MCHC 32.6 30.0 - 36.0 g/dL   RDW 14.4 11.5 - 15.5 %   Platelets 274 150 - 400 K/uL   nRBC 0.0 0.0 - 0.2 %    Comment: Performed at [facility], [address] [place], [place] [address]  Differential     Status: Abnormal   Collection Time: 02/11/22 10:33 PM  Result  Value Ref Range   Neutrophils Relative % 68 %   Neutro Abs 8.6 (H) 1.7 - 7.7 K/uL   Lymphocytes Relative 22 %   Lymphs Abs 2.7 0.7 - 4.0 K/uL   Monocytes Relative 6 %   Monocytes Absolute 0.7 0.1 - 1.0 K/uL   Eosinophils Relative 3 %   Eosinophils Absolute 0.4 0.0 - 0.5 K/uL   Basophils Relative 1 %   Basophils Absolute 0.1 0.0 - 0.1 K/uL   Immature Granulocytes 0 %   Abs Immature Granulocytes 0.05 0.00 - 0.07 K/uL    Comment: Performed at [facility], [address] [place], [place] [address]  Comprehensive metabolic panel     Status: Abnormal   Collection Time: 02/11/22 10:33 PM  Result Value Ref Range   Sodium 140 135 - 145 mmol/L   Potassium 3.6 3.5 - 5.1 mmol/L   Chloride 102 98 - 111 mmol/L   CO2 25 22 - 32 mmol/L   Glucose, Bld 195 (H) 70 - 99 mg/dL    Comment: Glucose reference range applies only to samples taken after fasting for at least 8 hours.   BUN 13 8 - 23 mg/dL   Creatinine, Ser 0.76 0.44 - 1.00 mg/dL   Calcium 9.2 8.9 - 10.3 mg/dL   Total Protein 7.0 6.5 - 8.1 g/dL   Albumin 3.7 3.5 - 5.0 g/dL   AST 23 15 - 41 U/L   ALT 31 0 - 44 U/L   Alkaline Phosphatase 70 38 - 126 U/L   Total Bilirubin 0.3 0.3 - 1.2 mg/dL   GFR, Estimated >60 >60 mL/min    Comment: (NOTE) Calculated using the CKD-EPI Creatinine Equation (2021)    Anion gap 13 5 - 15    Comment: Performed at [facility], [address], [place], [place] [address]  I-stat chem 8, ED     Status: Abnormal   Collection Time: 02/11/22 10:40 PM  Result Value Ref Range   Sodium 138 135 - 145 mmol/L   Potassium 3.8 3.5 - 5.1 mmol/L   Chloride 105 98 - 111 mmol/L   BUN 14 8 - 23 mg/dL   Creatinine, Ser 0.70 0.44 - 1.00 mg/dL   Glucose, Bld 184 (H) 70 - 99 mg/dL    Comment: Glucose reference range applies only to samples taken after fasting for at least 8 hours.   Calcium, Ion 0.91 (L) 1.15 - 1.40 mmol/L  TCO2 24 22 - 32 mmol/L   Hemoglobin 15.6 (H) 12.0 - 15.0 g/dL   HCT 46.0 36.0 - 46.0 %  Urine  rapid drug screen (hosp performed)     Status: None   Collection Time: 02/12/22  4:00 AM  Result Value Ref Range   Opiates NONE DETECTED NONE DETECTED   Cocaine NONE DETECTED NONE DETECTED   Benzodiazepines NONE DETECTED NONE DETECTED   Amphetamines NONE DETECTED NONE DETECTED   Tetrahydrocannabinol NONE DETECTED NONE DETECTED   Barbiturates NONE DETECTED NONE DETECTED    Comment: (NOTE) DRUG SCREEN FOR MEDICAL PURPOSES ONLY.  IF CONFIRMATION IS NEEDED FOR ANY PURPOSE, NOTIFY LAB WITHIN 5 DAYS.  LOWEST DETECTABLE LIMITS FOR URINE DRUG SCREEN Drug Class                     Cutoff (ng/mL) Amphetamine and metabolites    1000 Barbiturate and metabolites    200 Benzodiazepine                 200 Opiates and metabolites        300 Cocaine and metabolites        300 THC                            50 Performed at [facility], [address] [place], [place] [address]   Urinalysis, Routine w reflex microscopic Urine, Clean Catch     Status: Abnormal   Collection Time: 02/12/22  4:00 AM  Result Value Ref Range   Color, Urine YELLOW YELLOW   APPearance CLEAR CLEAR   Specific Gravity, Urine 1.016 1.005 - 1.030   pH 5.0 5.0 - 8.0   Glucose, UA NEGATIVE NEGATIVE mg/dL   Hgb urine dipstick NEGATIVE NEGATIVE   Bilirubin Urine NEGATIVE NEGATIVE   Ketones, ur 5 (A) NEGATIVE mg/dL   Protein, ur NEGATIVE NEGATIVE mg/dL   Nitrite NEGATIVE NEGATIVE   Leukocytes,Ua NEGATIVE NEGATIVE    Comment: Performed at [facility], [address] [place], [place] [address]  Hemoglobin A1c     Status: Abnormal   Collection Time: 02/12/22  4:46 AM  Result Value Ref Range   Hgb A1c MFr Bld 8.0 (H) 4.8 - 5.6 %    Comment: (NOTE)         Prediabetes: 5.7 - 6.4         Diabetes: >6.4         Glycemic control for adults with diabetes: <7.0    Mean Plasma Glucose 183 mg/dL    Comment: (NOTE) Performed At: [facility] [place], [place] [id number] Rush Farmer MD [id number]    HIV Antibody (routine testing w rflx)     Status: None   Collection Time: 02/12/22  4:46 AM  Result Value Ref Range   HIV Screen 4th Generation wRfx Non Reactive Non Reactive    Comment: Performed at [facility], [address] [place], [place] [address]  Comprehensive metabolic panel     Status: Abnormal   Collection Time: 02/12/22  4:46 AM  Result Value Ref Range   Sodium 138 135 - 145 mmol/L   Potassium 4.5 3.5 - 5.1 mmol/L   Chloride 104 98 - 111 mmol/L   CO2 25 22 - 32 mmol/L   Glucose, Bld 262 (H) 70 - 99 mg/dL    Comment: Glucose reference range applies only to samples taken after fasting for at least  8 hours.   BUN 13 8 - 23 mg/dL   Creatinine, Ser 0.73 0.44 - 1.00 mg/dL   Calcium 9.3 8.9 - 10.3 mg/dL   Total Protein 7.1 6.5 - 8.1 g/dL   Albumin 3.6 3.5 - 5.0 g/dL   AST 29 15 - 41 U/L   ALT 33 0 - 44 U/L   Alkaline Phosphatase 72 38 - 126 U/L   Total Bilirubin 0.4 0.3 - 1.2 mg/dL   GFR, Estimated >60 >60 mL/min    Comment: (NOTE) Calculated using the CKD-EPI Creatinine Equation (2021)    Anion gap 9 5 - 15    Comment: Performed at [facility], [address], [place], [place] [address]  CBC     Status: Abnormal   Collection Time: 02/12/22  4:46 AM  Result Value Ref Range   WBC 14.7 (H) 4.0 - 10.5 K/uL   RBC 5.72 (H) 3.87 - 5.11 MIL/uL   Hemoglobin 16.6 (H) 12.0 - 15.0 g/dL   HCT 49.8 (H) 36.0 - 46.0 %   MCV 87.1 80.0 - 100.0 fL   MCH 29.0 26.0 - 34.0 pg   MCHC 33.3 30.0 - 36.0 g/dL   RDW 14.2 11.5 - 15.5 %   Platelets 276 150 - 400 K/uL   nRBC 0.0 0.0 - 0.2 %    Comment: Performed at [facility], [address], [place], [place] [address]  Magnesium     Status: None   Collection Time: 02/12/22  4:46 AM  Result Value Ref Range   Magnesium 2.0 1.7 - 2.4 mg/dL    Comment: Performed at [facility], [address], [place], [place] [address]  Phosphorus     Status: None   Collection Time: 02/12/22  4:46 AM  Result Value Ref Range   Phosphorus 3.3 2.5 -  4.6 mg/dL    Comment: Performed at [facility], [address] [place], [place] [address]  CBG monitoring, ED     Status: Abnormal   Collection Time: 02/12/22  6:37 AM  Result Value Ref Range   Glucose-Capillary 292 (H) 70 - 99 mg/dL    Comment: Glucose reference range applies only to samples taken after fasting for at least 8 hours.  CBG monitoring, ED     Status: Abnormal   Collection Time: 02/12/22  7:41 AM  Result Value Ref Range   Glucose-Capillary 301 (H) 70 - 99 mg/dL    Comment: Glucose reference range applies only to samples taken after fasting for at least 8 hours.  CBG monitoring, ED     Status: Abnormal   Collection Time: 02/12/22 12:37 PM  Result Value Ref Range   Glucose-Capillary 294 (H) 70 - 99 mg/dL    Comment: Glucose reference range applies only to samples taken after fasting for at least 8 hours.  CBG monitoring, ED     Status: Abnormal   Collection Time: 02/12/22  4:57 PM  Result Value Ref Range   Glucose-Capillary 244 (H) 70 - 99 mg/dL    Comment: Glucose reference range applies only to samples taken after fasting for at least 8 hours.  CBG monitoring, ED     Status: Abnormal   Collection Time: 02/12/22  9:30 PM  Result Value Ref Range   Glucose-Capillary 284 (H) 70 - 99 mg/dL    Comment: Glucose reference range applies only to samples taken after fasting for at least 8 hours.  Glucose, capillary     Status: Abnormal   Collection Time: 02/13/22  3:11  AM  Result Value Ref Range   Glucose-Capillary 248 (H) 70 - 99 mg/dL    Comment: Glucose reference range applies only to samples taken after fasting for at least 8 hours.   Comment 1 Notify RN    Comment 2 Document in Chart   Glucose, capillary     Status: Abnormal   Collection Time: 02/13/22  6:02 AM  Result Value Ref Range   Glucose-Capillary 255 (H) 70 - 99 mg/dL    Comment: Glucose reference range applies only to samples taken after fasting for at least 8 hours.   Comment 1 Notify RN    Comment 2  Document in Chart    CT ANGIO HEAD NECK W WO CM  Result Date: 02/12/2022 CLINICAL DATA:  Stroke follow-up EXAM: CT ANGIOGRAPHY HEAD AND NECK TECHNIQUE: Multidetector CT imaging of the head and neck was performed using the standard protocol during bolus administration of intravenous contrast. Multiplanar CT image reconstructions and MIPs were obtained to evaluate the vascular anatomy. Carotid stenosis measurements (when applicable) are obtained utilizing NASCET criteria, using the distal internal carotid diameter as the denominator. RADIATION DOSE REDUCTION: This exam was performed according to the departmental dose-optimization program which includes automated exposure control, adjustment of the mA and/or kV according to patient size and/or use of iterative reconstruction technique. CONTRAST:  165m OMNIPAQUE IOHEXOL 350 MG/ML SOLN COMPARISON:  Head CT 02/12/2019 FINDINGS: CTA NECK FINDINGS SKELETON: There is no bony spinal canal stenosis. No lytic or blastic lesion. OTHER NECK: Normal pharynx, larynx and major salivary glands. No cervical lymphadenopathy. Unremarkable thyroid gland. UPPER CHEST: 8 mm nodules in the right upper and middle lobes AORTIC ARCH: There is calcific atherosclerosis of the aortic arch. There is no aneurysm, dissection or hemodynamically significant stenosis of the visualized portion of the aorta. Conventional 3 vessel aortic branching pattern. The visualized proximal subclavian arteries are widely patent. RIGHT CAROTID SYSTEM: Normal without aneurysm, dissection or stenosis. LEFT CAROTID SYSTEM: Normal without aneurysm, dissection or stenosis. VERTEBRAL ARTERIES: Left dominant configuration. Both origins are clearly patent. There is no dissection, occlusion or flow-limiting stenosis to the skull base (V1-V3 segments). CTA HEAD FINDINGS Unchanged appearance of area of vasogenic edema in the left hemisphere. POSTERIOR CIRCULATION: --Vertebral arteries: Normal V4 segments. --Inferior  cerebellar arteries: Normal. --Basilar artery: Normal. --Superior cerebellar arteries: Normal. --Posterior cerebral arteries (PCA): Normal. ANTERIOR CIRCULATION: --Intracranial internal carotid arteries: Normal. --Anterior cerebral arteries (ACA): Normal. Both A1 segments are present. Patent anterior communicating artery (a-comm). --Middle cerebral arteries (MCA): Normal. VENOUS SINUSES: As permitted by contrast timing, patent. ANATOMIC VARIANTS: None Review of the MIP images confirms the above findings. IMPRESSION: 1. No emergent large vessel occlusion or high-grade stenosis of the intracranial arteries. 2. Unchanged appearance of area of vasogenic edema in the left hemisphere. 3. A 8 mm nodules in the right upper and middle lobes, likely metastases. This is more completely characterized on concomitant CT chest, abdomen and pelvis. Aortic atherosclerosis (ICD10-I70.0). Electronically Signed   By: KUlyses JarredM.D.   On: 02/12/2022 01:39   CT CHEST ABDOMEN PELVIS W CONTRAST  Result Date: 02/12/2022 CLINICAL DATA:  Brain tumor seen on CT query metastatic disease EXAM: CT CHEST, ABDOMEN, AND PELVIS WITH CONTRAST TECHNIQUE: Multidetector CT imaging of the chest, abdomen and pelvis was performed following the standard protocol during bolus administration of intravenous contrast. RADIATION DOSE REDUCTION: This exam was performed according to the departmental dose-optimization program which includes automated exposure control, adjustment of the mA and/or kV according to patient size  and/or use of iterative reconstruction technique. CONTRAST:  122m OMNIPAQUE IOHEXOL 350 MG/ML SOLN COMPARISON:  None Available. FINDINGS: CT CHEST FINDINGS Cardiovascular: Coronary artery calcifications. Aortic arch calcifications. Normal heart size. No pericardial effusion. Mediastinum/Nodes: No enlarged mediastinal, hilar, or axillary lymph nodes. Thyroid gland, trachea, and esophagus demonstrate no significant findings. Lungs/Pleura:  Numerous bilateral small pulmonary nodules with the largest measuring 9 mm in the lateral right upper lobe (4/41). Irregular mass along the left lower lobe medially measures 5.4 x 4.1 by 5.4 cm. This obstructs multiple segmental right lower lobe bronchi. No pleural effusion or pneumothorax. Musculoskeletal: No chest wall mass or suspicious bone lesions identified. CT ABDOMEN PELVIS FINDINGS Hepatobiliary: Large 4.4 cm gallstone in the gallbladder neck. No evidence of cholecystitis. No biliary dilation. No suspicious hepatic lesion. Pancreas: Unremarkable. Spleen: Unremarkable. Adrenals/Urinary Tract: Normal adrenal glands. No suspicious renal mass. No hydronephrosis or urinary calculi. Unremarkable bladder. Stomach/Bowel: Normal caliber large and small bowel. Colonic diverticulosis without diverticulitis. Normal appendix. Unremarkable stomach. Vascular/Lymphatic: Aortic athero sclerotic calcification. There are few prominent subcentimeter upper retroperitoneal nodes (for example precaval node measuring 8 mm in short axis (3/61). Reproductive: Hysterectomy.  No suspicious adnexal mass. Other: No free intraperitoneal fluid or air. Musculoskeletal: No acute osseous abnormality or destructive osseous lesion. Thoracolumbar spondylosis. Sclerosis in the left femoral head has a benign appearance. IMPRESSION: 1. 5.4 cm mass in the medial left lower lobe concerning for malignancy. 2. Additional bilateral pulmonary nodules measuring up to 9 mm suspicious for metastases. 3. No evidence of malignancy in the abdomen or pelvis. 4. 4.4 cm gallstone in the gallbladder neck. 5.  Aortic Atherosclerosis (ICD10-I70.0). Electronically Signed   By: TPlacido SouM.D.   On: 02/12/2022 01:31   CT HEAD CODE STROKE WO CONTRAST  Result Date: 02/11/2022 CLINICAL DATA:  Code stroke. EXAM: CT HEAD WITHOUT CONTRAST TECHNIQUE: Contiguous axial images were obtained from the base of the skull through the vertex without intravenous contrast.  RADIATION DOSE REDUCTION: This exam was performed according to the departmental dose-optimization program which includes automated exposure control, adjustment of the mA and/or kV according to patient size and/or use of iterative reconstruction technique. COMPARISON:  Brain MRI 01/25/2021 FINDINGS: Brain: There is a large area of vasogenic edema within the left frontal and parietal lobes. On the right, there is a punctate hyperdense focus with adjacent vasogenic edema at the superior paramedian right frontal lobe (3:30). No midline shift. Vascular: No abnormal hyperdensity of the major intracranial arteries or dural venous sinuses. No intracranial atherosclerosis. Skull: The visualized skull base, calvarium and extracranial soft tissues are normal. Sinuses/Orbits: No fluid levels or advanced mucosal thickening of the visualized paranasal sinuses. No mastoid or middle ear effusion. The orbits are normal. IMPRESSION: Large area of vasogenic edema in the left frontal/parietal lobe with overlying subarachnoid blood. Second, smaller lesion in the superior right frontal lobe. To gather appearance is concerning for metastatic disease. MRI of the brain with and without contrast is suggested. Critical Value/emergent results were called by telephone at the time of interpretation on 02/11/2022 at 10:53 pm to provider [name] TRIFAN , who verbally acknowledged these results. Electronically Signed   By: KUlyses JarredM.D.   On: 02/11/2022 22:53    Pertinent items noted in HPI and remainder of comprehensive ROS otherwise negative.  Blood pressure (!) 115/51, pulse 72, temperature 98.2 F (36.8 C), resp. rate 18, height 5' 8" (1.727 m), weight 113.4 kg, SpO2 95 %.  Patient is awake and alert.  She is oriented and  appropriate.  Her speech is reasonably fluent but she does have some word finding difficulty.  Cranial nerve function normal bilateral.  Motor examination 5/5.  No pronator drift.  Sensory examination nonfocal.   Chest and abdomen benign.   Assessment/Plan Likely metastatic lung cancer to the brain.  Evaluate with MRI scan.  Establish tissue diagnosis with biopsy.  Follow-up with me in the office to discuss treatment plans for the metastatic process in the brain.  Mallie Mussel A Pool 02/13/2022, 10:34 AM

## 2022-02-13 NOTE — Progress Notes (Addendum)
Discharge instructions reviewed and read to pt.  Copy of instructions given to pt. Scripts filled by [facility] and were delivered to pt at bedside.   Instructions printout for bronchoscopy were reviewed and given to pt Pt's mother here and will be taking pt home, pt eating her dinner meal at this time and then will call out to front desk when dressed to go home.

## 2022-02-13 NOTE — Discharge Summary (Signed)
Physician Discharge Summary   Patient: Carolyn Hooper MRN: [id number] DOB: [birth year]  Admit date:     02/11/2022  Discharge date: 02/13/22  Discharge Physician: Edwin Dada   PCP: [name], FNP     Recommendations at discharge:  Follow up with Pulmonology Dr. Valeta Harms for bronchoscopy and biopsy of lung nodule Follow up with Dr. 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 F with obesity, smoking who presented with aphasia.  In the ER, CT head showed brain mass with vasogenic edema and CT chest showed lung mass.    Brain mass Lung mass Cerebral edema due to brain mass with aphasia Concern for primary lung cancer with brain metastasis.  Case reviewed with IR and Pulmonary, there was agreement that biopsy by bronchoscpy should be most straightforward and safe.  Pulmonology discussed with patient who requested to perform this in 1 week as an outpatient  She was started on Decadron to treat edema related to mass.  She was evaluated by PT and OT who felt she was at physical baseline.  She was started on keppra for seizure prophylaxis.    Obesity  BMI 38          [facility] Controlled Substances Registry was reviewed for this patient prior to discharge.  Consultants: Neurosurgery, Pulmonology Procedures performed: MRI brain with and without contrast  Disposition: Home   DISCHARGE MEDICATION: Allergies as of 02/13/2022       Reactions   Adhesive [tape] Dermatitis   Statins Other (See Comments)   Myalgia         Medication List     TAKE these medications    albuterol 108 (90 Base) MCG/ACT inhaler Commonly known as: VENTOLIN  HFA Inhale 2 puffs into the lungs every 6 (six) hours as needed.   blood glucose meter kit and supplies Dispense based on patient and insurance preference. Use up to four times daily as directed. (FOR ICD-10 E10.9, E11.9).   clonazePAM 0.5 MG tablet Commonly known as: KLONOPIN Take 0.5 tablets (0.25 mg total) by mouth at bedtime. For anxiety   dexamethasone 4 MG tablet Commonly known as: DECADRON Take 1 tablet (4 mg total) by mouth 2 (two) times daily with a meal.   levETIRAcetam 500 MG tablet Commonly known as: KEPPRA Take 1 tablet (500 mg total) by mouth 2 (two) times daily.   metFORMIN 1000 MG tablet Commonly known as: GLUCOPHAGE Take 1 tablet (1,000 mg total) by mouth 2 (two) times daily.   ondansetron 4 MG tablet Commonly known as: ZOFRAN Take 1 tablet (4 mg total) by mouth every 6 (six) hours as needed for nausea.        Follow-up Information     Earnie Larsson, MD. Schedule an appointment as soon as possible for a visit in 1 week(s).   Specialty: Neurosurgery Contact information: [address] [place] [address] [phone]         June Leap L, DO Follow up.   Specialty: Pulmonary Disease Contact information: [address] [place] [address] [phone]  Discharge Instructions     Discharge instructions   Complete by: As directed    **IMPORTANT DISCHARGE INSTRUCTIONS**   From Dr. Loleta Books: You were admitted for word finding difficulties Here, we found that you had an abnormal brain imaging.    Further work up showed that this was likely coming from a mass in your lung.  You were evaluated by Dr. Valeta Harms from Pulmonology and Dr. Annette Stable from Neurosurgery.  You should follow up with both  Your MRI of the brain showed two small masses in the brain.  To prevent swelling of the brain around the masses, you should take dexamethasone 4 mg twice daily When you see Dr. Valeta Harms, ask if you need to  continue this after your bronchoscopy and if so, for how long  The masses can cause irritation to the neighboring brain tissue.  To prevent seizures from this irritation, take levetiracetam/Keppra 500 mg twice daily  Dr. Juline Patch office has arranged for your bronchoscopy to sample tissue See the number to his office listed below if you have questions  Follow up with Dr. Annette Stable as directed  Dr. Valeta Harms will manage the appropriate Oncology follow up after the biopsy   Increase activity slowly   Complete by: As directed        Discharge Exam: Filed Weights   02/12/22 0900  Weight: 113.4 kg    General: Pt is alert, awake, not in acute distress Cardiovascular: RRR, nl S1-S2, no murmurs appreciated.   No LE edema.   Respiratory: Normal respiratory rate and rhythm.  CTAB without rales or wheezes. Abdominal: Abdomen soft and non-tender.  No distension or HSM.   Neuro/Psych: Strength symmetric in upper and lower extremities.  Judgment and insight appear normal.   Condition at discharge: good  The results of significant diagnostics from this hospitalization (including imaging, microbiology, ancillary and laboratory) are listed below for reference.   Imaging Studies: MR BRAIN W WO CONTRAST  Result Date: 02/13/2022 CLINICAL DATA:  Assess for intracranial metastatic disease. History of right vestibular schwannoma. Recent abnormal neuro imaging with edema in the left brain. EXAM: MRI HEAD WITHOUT AND WITH CONTRAST TECHNIQUE: Multiplanar, multiecho pulse sequences of the brain and surrounding structures were obtained without and with intravenous contrast. CONTRAST:  2m GADAVIST GADOBUTROL 1 MMOL/ML IV SOLN COMPARISON:  CT yesterday.  MRI 01/26/2021 FINDINGS: Brain: There is a lobular up to 2.2 cm in diameter mass at the left posterior frontal vertex with regional edema. There is a second 6 x 8 mm mass in the medial right posterior frontal vertex with minimal edema. These are most likely to be  metastatic lesions. No evidence of ischemic change within the brain. Approximately 5 mm vestibular schwannoma remains visible in the right IAC. No hydrocephalus. No extra-axial collection. No evidence of hemorrhage or shift. Vascular: Major vessels at the base of the brain show flow. Skull and upper cervical spine: Negative Sinuses/Orbits: Clear/normal Other: None IMPRESSION: 1. 2.2 cm mass at the left posterior frontal vertex with regional edema. 6 x 8 mm mass at the medial right posterior frontal vertex with minimal edema. These are most likely metastatic lesions. 2. 5 mm vestibular schwannoma remains visible in the right IAC. Electronically Signed   By: MNelson ChimesM.D.   On: 02/13/2022 15:19   CT ANGIO HEAD NECK W WO CM  Result Date: 02/12/2022 CLINICAL DATA:  Stroke follow-up EXAM: CT ANGIOGRAPHY HEAD AND NECK TECHNIQUE: Multidetector CT imaging of the head and neck was performed using the standard protocol  during bolus administration of intravenous contrast. Multiplanar CT image reconstructions and MIPs were obtained to evaluate the vascular anatomy. Carotid stenosis measurements (when applicable) are obtained utilizing NASCET criteria, using the distal internal carotid diameter as the denominator. RADIATION DOSE REDUCTION: This exam was performed according to the departmental dose-optimization program which includes automated exposure control, adjustment of the mA and/or kV according to patient size and/or use of iterative reconstruction technique. CONTRAST:  185m OMNIPAQUE IOHEXOL 350 MG/ML SOLN COMPARISON:  Head CT 02/12/2019 FINDINGS: CTA NECK FINDINGS SKELETON: There is no bony spinal canal stenosis. No lytic or blastic lesion. OTHER NECK: Normal pharynx, larynx and major salivary glands. No cervical lymphadenopathy. Unremarkable thyroid gland. UPPER CHEST: 8 mm nodules in the right upper and middle lobes AORTIC ARCH: There is calcific atherosclerosis of the aortic arch. There is no aneurysm,  dissection or hemodynamically significant stenosis of the visualized portion of the aorta. Conventional 3 vessel aortic branching pattern. The visualized proximal subclavian arteries are widely patent. RIGHT CAROTID SYSTEM: Normal without aneurysm, dissection or stenosis. LEFT CAROTID SYSTEM: Normal without aneurysm, dissection or stenosis. VERTEBRAL ARTERIES: Left dominant configuration. Both origins are clearly patent. There is no dissection, occlusion or flow-limiting stenosis to the skull base (V1-V3 segments). CTA HEAD FINDINGS Unchanged appearance of area of vasogenic edema in the left hemisphere. POSTERIOR CIRCULATION: --Vertebral arteries: Normal V4 segments. --Inferior cerebellar arteries: Normal. --Basilar artery: Normal. --Superior cerebellar arteries: Normal. --Posterior cerebral arteries (PCA): Normal. ANTERIOR CIRCULATION: --Intracranial internal carotid arteries: Normal. --Anterior cerebral arteries (ACA): Normal. Both A1 segments are present. Patent anterior communicating artery (a-comm). --Middle cerebral arteries (MCA): Normal. VENOUS SINUSES: As permitted by contrast timing, patent. ANATOMIC VARIANTS: None Review of the MIP images confirms the above findings. IMPRESSION: 1. No emergent large vessel occlusion or high-grade stenosis of the intracranial arteries. 2. Unchanged appearance of area of vasogenic edema in the left hemisphere. 3. A 8 mm nodules in the right upper and middle lobes, likely metastases. This is more completely characterized on concomitant CT chest, abdomen and pelvis. Aortic atherosclerosis (ICD10-I70.0). Electronically Signed   By: KUlyses JarredM.D.   On: 02/12/2022 01:39   CT CHEST ABDOMEN PELVIS W CONTRAST  Result Date: 02/12/2022 CLINICAL DATA:  Brain tumor seen on CT query metastatic disease EXAM: CT CHEST, ABDOMEN, AND PELVIS WITH CONTRAST TECHNIQUE: Multidetector CT imaging of the chest, abdomen and pelvis was performed following the standard protocol during bolus  administration of intravenous contrast. RADIATION DOSE REDUCTION: This exam was performed according to the departmental dose-optimization program which includes automated exposure control, adjustment of the mA and/or kV according to patient size and/or use of iterative reconstruction technique. CONTRAST:  1069mOMNIPAQUE IOHEXOL 350 MG/ML SOLN COMPARISON:  None Available. FINDINGS: CT CHEST FINDINGS Cardiovascular: Coronary artery calcifications. Aortic arch calcifications. Normal heart size. No pericardial effusion. Mediastinum/Nodes: No enlarged mediastinal, hilar, or axillary lymph nodes. Thyroid gland, trachea, and esophagus demonstrate no significant findings. Lungs/Pleura: Numerous bilateral small pulmonary nodules with the largest measuring 9 mm in the lateral right upper lobe (4/41). Irregular mass along the left lower lobe medially measures 5.4 x 4.1 by 5.4 cm. This obstructs multiple segmental right lower lobe bronchi. No pleural effusion or pneumothorax. Musculoskeletal: No chest wall mass or suspicious bone lesions identified. CT ABDOMEN PELVIS FINDINGS Hepatobiliary: Large 4.4 cm gallstone in the gallbladder neck. No evidence of cholecystitis. No biliary dilation. No suspicious hepatic lesion. Pancreas: Unremarkable. Spleen: Unremarkable. Adrenals/Urinary Tract: Normal adrenal glands. No suspicious renal mass. No hydronephrosis or urinary calculi. Unremarkable  bladder. Stomach/Bowel: Normal caliber large and small bowel. Colonic diverticulosis without diverticulitis. Normal appendix. Unremarkable stomach. Vascular/Lymphatic: Aortic athero sclerotic calcification. There are few prominent subcentimeter upper retroperitoneal nodes (for example precaval node measuring 8 mm in short axis (3/61). Reproductive: Hysterectomy.  No suspicious adnexal mass. Other: No free intraperitoneal fluid or air. Musculoskeletal: No acute osseous abnormality or destructive osseous lesion. Thoracolumbar spondylosis. Sclerosis  in the left femoral head has a benign appearance. IMPRESSION: 1. 5.4 cm mass in the medial left lower lobe concerning for malignancy. 2. Additional bilateral pulmonary nodules measuring up to 9 mm suspicious for metastases. 3. No evidence of malignancy in the abdomen or pelvis. 4. 4.4 cm gallstone in the gallbladder neck. 5.  Aortic Atherosclerosis (ICD10-I70.0). Electronically Signed   By: Placido Sou M.D.   On: 02/12/2022 01:31   CT HEAD CODE STROKE WO CONTRAST  Result Date: 02/11/2022 CLINICAL DATA:  Code stroke. EXAM: CT HEAD WITHOUT CONTRAST TECHNIQUE: Contiguous axial images were obtained from the base of the skull through the vertex without intravenous contrast. RADIATION DOSE REDUCTION: This exam was performed according to the departmental dose-optimization program which includes automated exposure control, adjustment of the mA and/or kV according to patient size and/or use of iterative reconstruction technique. COMPARISON:  Brain MRI 01/25/2021 FINDINGS: Brain: There is a large area of vasogenic edema within the left frontal and parietal lobes. On the right, there is a punctate hyperdense focus with adjacent vasogenic edema at the superior paramedian right frontal lobe (3:30). No midline shift. Vascular: No abnormal hyperdensity of the major intracranial arteries or dural venous sinuses. No intracranial atherosclerosis. Skull: The visualized skull base, calvarium and extracranial soft tissues are normal. Sinuses/Orbits: No fluid levels or advanced mucosal thickening of the visualized paranasal sinuses. No mastoid or middle ear effusion. The orbits are normal. IMPRESSION: Large area of vasogenic edema in the left frontal/parietal lobe with overlying subarachnoid blood. Second, smaller lesion in the superior right frontal lobe. To gather appearance is concerning for metastatic disease. MRI of the brain with and without contrast is suggested. Critical Value/emergent results were called by telephone at  the time of interpretation on 02/11/2022 at 10:53 pm to provider [name] TRIFAN , who verbally acknowledged these results. Electronically Signed   By: Ulyses Jarred M.D.   On: 02/11/2022 22:53    Microbiology: Results for orders placed or performed in visit on 12/03/19  Novel Coronavirus, NAA ([facility])     Status: None   Collection Time: 12/03/19  4:15 PM   Specimen: Nasopharyngeal(NP) swabs in vial transport medium  Result Value Ref Range Status   SARS-CoV-2, NAA Not Detected Not Detected Final    Comment: This nucleic acid amplification test was developed and its performance characteristics determined by [employer]. Nucleic acid amplification tests include RT-PCR and TMA. This test has not been FDA cleared or approved. This test has been authorized by FDA under an Emergency Use Authorization (EUA). This test is only authorized for the duration of time the declaration that circumstances exist justifying the authorization of the emergency use of in vitro diagnostic tests for detection of SARS-CoV-2 virus and/or diagnosis of COVID-19 infection under section 564(b)(1) of the Act, 21 U.S.C. 956LOV-5(I) (1), unless the authorization is terminated or revoked sooner. When diagnostic testing is negative, the possibility of a false negative result should be considered in the context of a patient's recent exposures and the presence of clinical signs and symptoms consistent with COVID-19. An individual without symptoms of COVID-19 and who is not  shedding SARS-CoV-2 virus wo uld expect to have a negative (not detected) result in this assay.     Labs: CBC: Recent Labs  Lab 02/11/22 2233 02/11/22 2240 02/12/22 0446  WBC 12.6*  --  14.7*  NEUTROABS 8.6*  --   --   HGB 15.4* 15.6* 16.6*  HCT 47.2* 46.0 49.8*  MCV 87.7  --  87.1  PLT 274  --  912   Basic Metabolic Panel: Recent Labs  Lab 02/11/22 2233 02/11/22 2240 02/12/22 0446  NA 140 138 138  K 3.6 3.8 4.5  CL 102 105  104  CO2 25  --  25  GLUCOSE 195* 184* 262*  BUN _0 CREATININE 0.76 0.70 0.73  CALCIUM 9.2  --  9.3  MG  --   --  2.0  PHOS  --   --  3.3   Liver Function Tests: Recent Labs  Lab 02/11/22 2233 02/12/22 0446  AST 23 29  ALT 31 33  ALKPHOS 70 72  BILITOT 0.3 0.4  PROT 7.0 7.1  ALBUMIN 3.7 3.6   CBG: Recent Labs  Lab 02/12/22 2130 02/13/22 0311 02/13/22 0602 02/13/22 1142 02/13/22 1619  GLUCAP 284* 248* 255* 236* 174*    Discharge time spent: approximately 35 minutes spent on discharge counseling, evaluation of patient on day of discharge, and coordination of discharge planning with nursing, social work, pharmacy and case management  Signed: Edwin Dada, MD [facility] 02/13/2022

## 2022-02-13 NOTE — Evaluation (Signed)
Physical Therapy Evaluation and Discharge Patient Details Name: Carolyn Hooper MRN: [id number] DOB: [birth year] Today's Date: 02/13/2022  History of Present Illness  61 y.o. female with medical history significant of T2DM, right-sided schwannoma who presented to the emergency department via EMS due to sudden onset of speech difficulty and bil LE weakness R>L. CT +SAH with brain mass; also large left lung mass.  Clinical Impression   Patient evaluated by Physical Therapy with no further acute PT needs identified. All education has been completed and the patient has no further questions. Patient was mildly unsteady with ambulation which was corrected with use of RW. Patient states family is able to procure and she will use her late father's RW. Anticipate patient will need further PT after her planned surgery. PT is signing off. Thank you for this referral.        Recommendations for follow up therapy are one component of a multi-disciplinary discharge planning process, led by the attending physician.  Recommendations may be updated based on patient status, additional functional criteria and insurance authorization.  Follow Up Recommendations No PT follow up      Assistance Recommended at Discharge Set up Supervision/Assistance  Patient can return home with the following  Help with stairs or ramp for entrance;Assistance with cooking/housework;Direct supervision/assist for medications management;Direct supervision/assist for financial management;Assist for transportation    Equipment Recommendations Other (comment) (pt has family RW she can use; they will provide for her)  Recommendations for Other Services       Functional Status Assessment Patient has had a recent decline in their functional status and demonstrates the ability to make significant improvements in function in a reasonable and predictable amount of time.     Precautions / Restrictions Precautions Precautions: Fall       Mobility  Bed Mobility Overal bed mobility: Independent                  Transfers Overall transfer level: Needs assistance Equipment used: Rolling walker (2 wheels) Transfers: Sit to/from Stand Sit to Stand: Supervision           General transfer comment: vc for hand placment/sequencing only as instruction for how to use RW    Ambulation/Gait Ambulation/Gait assistance: Min assist, Supervision Gait Distance (Feet): 300 Feet (no device; 100 ft with RW) Assistive device: Rolling walker (2 wheels) Gait Pattern/deviations: Step-through pattern, Drifts right/left   Gait velocity interpretation: >2.62 ft/sec, indicative of community ambulatory   General Gait Details: with walker able to maintain straight path (without drifts to her right with head turns)  Science writer    Modified Rankin (Stroke Patients Only)       Balance Overall balance assessment: Needs assistance                               Standardized Balance Assessment Standardized Balance Assessment : Dynamic Gait Index   Dynamic Gait Index Level Surface: Moderate Impairment Change in Gait Speed: Moderate Impairment Gait with Horizontal Head Turns: Mild Impairment Gait with Vertical Head Turns: Mild Impairment Gait and Pivot Turn: Moderate Impairment Step Over Obstacle: Mild Impairment Step Around Obstacles: Mild Impairment Steps: Moderate Impairment Total Score: 12       Pertinent Vitals/Pain Pain Assessment Pain Assessment: No/denies pain    Home Living Family/patient expects to be discharged to:: Private residence Living Arrangements: Other relatives (53 yo friend  of family) Available Help at Discharge: Friend(s) Type of Home: Mobile home Home Access: Stairs to enter   Entrance Stairs-Number of Steps: 1   Home Layout: One level Home Equipment: Cane - single point;Shower seat;Rolling Environmental consultant (2 wheels) (walking sticks; RW and shower seat  would be borrowed from another house/family member) Additional Comments: Reports multiple family members that can bring walker to her home    Prior Function Prior Level of Function : Independent/Modified Independent;Working/employed;Driving (works from home; 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, newspaper        Extremity/Trunk Assessment        Lower Extremity Assessment Lower Extremity Assessment: RLE deficits/detail RLE Deficits / Details: mild weakness compared to LLE    Cervical / Trunk Assessment Cervical / Trunk Assessment: Normal  Communication   Communication: Expressive difficulties  Cognition Arousal/Alertness: Awake/alert Behavior During Therapy: WFL for tasks assessed/performed Overall Cognitive Status: Within Functional Limits for tasks assessed                                 General Comments: word-finding issues, but able to describe word she is searching for and be understood        General Comments      Exercises     Assessment/Plan    PT Assessment Patient does not need any further PT services  PT Problem List         PT Treatment Interventions      PT Goals (Current goals can be found in the Care Plan section)  Acute Rehab PT Goals Patient Stated Goal: be safe on her feet and able to go home PT Goal Formulation: All assessment and education complete, DC therapy    Frequency       Co-evaluation               AM-PAC PT "6 Clicks" Mobility  Outcome Measure Help needed turning from your back to your side while in a flat bed without using bedrails?: None Help needed moving from lying on your back to sitting on the side of a flat bed without using bedrails?: None Help needed moving to and from a bed to a chair (including a wheelchair)?: None Help needed standing up from a chair using your arms (e.g., wheelchair or bedside chair)?: None Help needed to walk in hospital room?: A  Little Help needed climbing 3-5 steps with a railing? : A Little 6 Click Score: 22    End of Session Equipment Utilized During Treatment: Gait belt Activity Tolerance: Patient tolerated treatment well Patient left: in bed;with call bell/phone within reach Nurse Communication: Mobility status;Other (comment) (will have family bring RW to her for home use) PT Visit Diagnosis: Unsteadiness on feet (R26.81)    Time: 1324-4010 PT Time Calculation (min) (ACUTE ONLY): 19 min   Charges:   PT Evaluation $PT Eval Low Complexity: [place], PT Acute Rehabilitation Services  Office [phone]   Rexanne Mano 02/13/2022, 11:46 AM

## 2022-02-13 NOTE — Consult Note (Signed)
NAME:  Carolyn Hooper, MRN:  [id number], DOB:  02-01-1961, LOS: 1 ADMISSION DATE:  02/11/2022, CONSULTATION DATE: 02/13/2022 REFERRING MD: Triad, CHIEF COMPLAINT: Lung mass  History of Present Illness:  61 year old female lifelong smoker, he was diagnosed with a right ear schwannoma approximate 11 months ago with other than vertigo no issues.  She awoke with right-sided weakness and expressive aphasia admitted to [facility] for evaluation and treatment.  CT scan showed left hemisphere vasogenic edema questionable metastases with CT of the chest showing left 5.4 cm mass with multiple nodules.  Pulmonary critical care has been asked to evaluate for possible navigational bronchoscopy for tissue biopsy.  CT scan is to be reviewed by pulmonologist with further recommendations to follow.  Pertinent  Medical History   Past Medical History:  Diagnosis Date   Abscess    hydroadenitis superativa   Anxiety    Hyperinsulinemia    Neuroma    audiotry neuroma   Vertigo      Significant Hospital Events: Including procedures, antibiotic start and stop dates in addition to other pertinent events     Interim History / Subjective:  Transferred from [facility] for neurology evaluation and possible bronchoscopy for tissue biopsy  Objective   Blood pressure (!) 115/51, pulse 72, temperature 98.2 F (36.8 C), resp. rate 18, height _0  (1.727 m), weight 113.4 kg, SpO2 95 %.        Intake/Output Summary (Last 24 hours) at 02/13/2022 0931 Last data filed at 02/12/2022 1525 Gross per 24 hour  Intake --  Output 601 ml  Net -601 ml   Filed Weights   02/12/22 0900  Weight: 113.4 kg    Examination: General: Awake and alert no acute distress HENT: No JVD is appreciated Lungs: Decreased breath sounds in the bases Cardiovascular: Heart sounds are regular Abdomen: Obese soft nontender Extremities: Warm and dry Neuro: Continues to have expressive aphasia strength is increased  in the right side GU: Voids  Resolved Hospital Problem list     Assessment & Plan:  61 year old female pack-a-day smoker who has a history of right schwannoma from approximately 11 months ago.  Who now presents with waking up with right-sided weakness, expressive aphasia CT of the head with vasogenic edema and CT of the chest with right upper lobe nodule and was 5.4 cm left mass consistent with metastatic cancer.  Pulmonary critical care asked to evaluate for possible navigation bronchoscopy for tissue biopsy. 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 Per neuro  Steroid exacerbation of diabetes CBG (last 3)  Recent Labs    02/12/22 2130 02/13/22 0311 02/13/22 0602  GLUCAP 284* 248* 255*   Sliding-scale insulin protocol  Best Practice (right click and "Reselect all SmartList Selections" daily)   Diet/type: Regular consistency (see orders) DVT prophylaxis: not indicated GI prophylaxis: PPI Lines: N/A Foley:  N/A Code Status:  full code Last date of multidisciplinary goals of care discussion [tbd]  Labs   CBC: Recent Labs  Lab 02/11/22 2233 02/11/22 2240 02/12/22 0446  WBC 12.6*  --  14.7*  NEUTROABS 8.6*  --   --   HGB 15.4* 15.6* 16.6*  HCT 47.2* 46.0 49.8*  MCV 87.7  --  87.1  PLT 274  --  248    Basic Metabolic Panel: Recent Labs  Lab  02/11/22 2233 02/11/22 2240 02/12/22 0446  NA 140 138 138  K 3.6 3.8 4.5  CL 102 105 104  CO2 25  --  25  GLUCOSE 195* 184* 262*  BUN _0 CREATININE 0.76 0.70 0.73  CALCIUM 9.2  --  9.3  MG  --   --  2.0  PHOS  --   --  3.3   GFR: Estimated Creatinine Clearance: 96.3 mL/min (by C-G formula based on SCr of 0.73 mg/dL). Recent Labs  Lab 02/11/22 2233 02/12/22 0446  WBC 12.6* 14.7*    Liver Function Tests: Recent  Labs  Lab 02/11/22 2233 02/12/22 0446  AST 23 29  ALT 31 33  ALKPHOS 70 72  BILITOT 0.3 0.4  PROT 7.0 7.1  ALBUMIN 3.7 3.6   No results for input(s): "LIPASE", "AMYLASE" in the last 168 hours. No results for input(s): "AMMONIA" in the last 168 hours.  ABG    Component Value Date/Time   TCO2 24 02/11/2022 2240     Coagulation Profile: Recent Labs  Lab 02/11/22 2233  INR 0.9    Cardiac Enzymes: No results for input(s): "CKTOTAL", "CKMB", "CKMBINDEX", "TROPONINI" in the last 168 hours.  HbA1C: HB A1C (BAYER DCA - WAIVED)  Date/Time Value Ref Range Status  11/01/2021 10:07 AM 7.0 (H) 4.8 - 5.6 % Final    Comment:             Prediabetes: 5.7 - 6.4          Diabetes: >6.4          Glycemic control for adults with diabetes: <7.0   07/05/2020 09:00 AM 7.5 (H) <7.0 % Final    Comment:                                          Diabetic Adult            <7.0                                       Healthy Adult        4.3 - 5.7                                                           (DCCT/NGSP) American Diabetes Association's Summary of Glycemic Recommendations for Adults with Diabetes: Hemoglobin A1c <7.0%. More stringent glycemic goals (A1c <6.0%) may further reduce complications at the cost of increased risk of hypoglycemia.    Hgb A1c MFr Bld  Date/Time Value Ref Range Status  02/12/2022 04:46 AM 8.0 (H) 4.8 - 5.6 % Final    Comment:    (NOTE)         Prediabetes: 5.7 - 6.4         Diabetes: >6.4         Glycemic control for adults with diabetes: <7.0     CBG: Recent Labs  Lab 02/12/22 1237 02/12/22 1657 02/12/22 2130 02/13/22 0311 02/13/22 0602  GLUCAP 294* 244* 284* 248* 255*    Review of Systems:   10 point review of system taken, please see HPI  for positives and negatives. Positive for right-sided weakness Positive for aphasia Positive for seizure activity  Past Medical History:  She,  has a past medical history of Abscess, Anxiety,  Hyperinsulinemia, Neuroma, and Vertigo.   Surgical History:   Past Surgical History:  Procedure Laterality Date   ABDOMINAL HYSTERECTOMY  2006   remaining left ovary   SHOULDER SURGERY Right 1960s     Social History:   reports that she has been smoking cigarettes. She has been smoking an average of .75 packs per day. She has never used smokeless tobacco. She reports current alcohol use. She reports that she does not use drugs.   Family History:  Her family history includes Arthritis in her father; COPD in her mother; Diabetes in her father; Heart disease in her father; Kidney disease in her father; Liver disease in her father.   Allergies Allergies  Allergen Reactions   Adhesive [Tape] Dermatitis   Statins Other (See Comments)    Myalgia      Home Medications  Prior to Admission medications   Medication Sig Start Date End Date Taking? Authorizing Provider  albuterol (VENTOLIN HFA) 108 (90 Base) MCG/ACT inhaler Inhale 2 puffs into the lungs every 6 (six) hours as needed. 01/08/21  Yes Hawks, [name] A, FNP  clonazePAM (KLONOPIN) 0.5 MG tablet Take 0.5 tablets (0.25 mg total) by mouth at bedtime. For anxiety 11/01/21  Yes Hawks, [name] A, FNP  metFORMIN (GLUCOPHAGE) 1000 MG tablet Take 1 tablet (1,000 mg total) by mouth 2 (two) times daily. 11/01/21  Yes Hawks, Theador Hawthorne, FNP  blood glucose meter kit and supplies Dispense based on patient and insurance preference. Use up to four times daily as directed. (FOR ICD-10 E10.9, E11.9). 09/21/19   [name], FNP     Critical care time: Ferol Luz Odilon Cass ACNP Acute Care Nurse Practitioner [place] Please consult Amion 02/13/2022, 9:31 AM

## 2022-02-13 NOTE — Progress Notes (Signed)
PCCM:  Letter printed from [facility] with all of her instructions for Tuesday next week.  This was also reviewed with the patients son at bedside.   Garner Nash, DO [facility] 02/13/2022 12:32 PM

## 2022-02-15 ENCOUNTER — Other Ambulatory Visit: Payer: Self-pay | Admitting: *Deleted

## 2022-02-15 ENCOUNTER — Other Ambulatory Visit: Payer: BC Managed Care – PPO

## 2022-02-15 DIAGNOSIS — Z01818 Encounter for other preprocedural examination: Secondary | ICD-10-CM | POA: Diagnosis not present

## 2022-02-17 LAB — NOVEL CORONAVIRUS, NAA: SARS-CoV-2, NAA: NOT DETECTED

## 2022-02-17 LAB — SPECIMEN STATUS REPORT

## 2022-02-18 ENCOUNTER — Encounter (HOSPITAL_COMMUNITY): Payer: Self-pay | Admitting: Pulmonary Disease

## 2022-02-18 ENCOUNTER — Other Ambulatory Visit: Payer: Self-pay

## 2022-02-18 NOTE — Anesthesia Preprocedure Evaluation (Addendum)
Anesthesia Evaluation  Patient identified by MRN, date of birth, ID band Patient awake    Reviewed: Allergy & Precautions, H&P , NPO status , Patient's Chart, lab work & pertinent test results  Airway Mallampati: II  TM Distance: >3 FB Neck ROM: Full    Dental no notable dental hx. (+) Teeth Intact, Dental Advisory Given   Pulmonary neg pulmonary ROS, former smoker Lung mass   Pulmonary exam normal breath sounds clear to auscultation       Cardiovascular Exercise Tolerance: Good hypertension,  Rhythm:Regular Rate:Normal     Neuro/Psych Seizures -,   Anxiety     Brain mets    GI/Hepatic negative GI ROS, Neg liver ROS,,,  Endo/Other  diabetes, Type 2, Oral Hypoglycemic Agents  Morbid obesity  Renal/GU negative Renal ROS  negative genitourinary   Musculoskeletal   Abdominal   Peds  Hematology negative hematology ROS (+)   Anesthesia Other Findings   Reproductive/Obstetrics negative OB ROS                             Anesthesia Physical Anesthesia Plan  ASA: 4  Anesthesia Plan: General   Post-op Pain Management: Tylenol PO (pre-op)*   Induction: Intravenous  PONV Risk Score and Plan: 3 and Ondansetron, Dexamethasone, Propofol infusion and TIVA  Airway Management Planned: Oral ETT  Additional Equipment:   Intra-op Plan:   Post-operative Plan: Extubation in OR  Informed Consent: I have reviewed the patients History and Physical, chart, labs and discussed the procedure including the risks, benefits and alternatives for the proposed anesthesia with the patient or authorized representative who has indicated his/her understanding and acceptance.     Dental advisory given  Plan Discussed with: CRNA  Anesthesia Plan Comments: (PAT note written 02/18/2022 by Myra Gianotti, PA-C.  )       Anesthesia Quick Evaluation

## 2022-02-18 NOTE — Progress Notes (Signed)
Orders placed for procedure.

## 2022-02-18 NOTE — Progress Notes (Signed)
Anesthesia Chart Review: SAME DAY WORK-UP  Case: [id number] Date/Time: 02/19/22 0730   Procedures:      ROBOTIC ASSISTED NAVIGATIONAL BRONCHOSCOPY (Bilateral)     VIDEO BRONCHOSCOPY WITH ENDOBRONCHIAL ULTRASOUND (Bilateral)   Anesthesia type: General   Diagnosis: Lung mass [R91.8]   Pre-op diagnosis: lung mass   Location: [facility] RO[facility] 3 / [place] [facility]   Surgeons: Garner Nash, DO       DISCUSSION: Patient is a 61 year old female scheduled for the above procedure.   History includes former smoker (quit 02/11/22), HTN (during pregnancy), DM2, hyperinsulinemia, auditory neuroma (right schwannoma), vertigo, hysterectomy (with RSO 10/24/04)., brain mass (02/11/22 with associated seizure-like activity, see below).  [place] admission 02/11/22-02/13/22 for speech difficulties and an episode of RUE focal shaking with concern for CVA. Initial head CT showed large area of vasogenic edema in the left frontal/parietal lobe with with concern for overlying SAH, second small lesion in the superior right frontal lobe--findings suggestive of metastatic disease. No large vessel occlusion of high grade stenosis by CTA head/neck. CT chest/abd/pelvis showed a 5.4 cm mass in the LLL with additional bilateral pulmonary nodules suspirious for malignancy with metastases. No evidence of malignancy in the abdomen or pelvis, but other findings of 4.4 cm gallstone, aortic atherosclerosis. 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 Imaging suggestive of primary lung cancer with metastasis. She was started on Decadaron to treat edema and Keppra for seizure prophylaxis. IR and pulmonology were contacted to discuss biopsy by bronchoscopy. Evaluated by neurology and pulmonology while admitted.  Out-patient follow-up with Dr. Valeta Harms for bronchoscopy planned after she had been on Decadron for several days. Follow-up with neurosurgeon Dr. Annette Stable also recommended.   Anesthesia team to evaluate on the day of surgery. She had labs on 01/2722/02/13/22. A1c 8.0%, H/H 15.4/46.2, PLT 292, Cr 0.77, AST 29, ALT 33. EKG showed NSR 02/11/22.     VS:  BP Readings from Last 3 Encounters:  02/13/22 134/70  11/16/21 (!) 116/59  11/01/21 113/75   Pulse Readings from Last 3 Encounters:  02/13/22 64  11/16/21 68  11/01/21 74     PROVIDERS: [name], FNP is PCP  June Leap, DO is pulmonologist Deri Fuelling, MD is neurosurgeon   LABS: Most recent labs results in [facility] include: Lab Results  Component Value Date   WBC 16.4 (H) 02/13/2022   HGB 15.4 (H) 02/13/2022   HCT 46.2 (H) 02/13/2022   PLT 292 02/13/2022   GLUCOSE 247 (H) 02/13/2022   CHOL 185 11/01/2021   TRIG 128 11/01/2021   HDL 36 (L) 11/01/2021   LDLCALC 126 (H) 11/01/2021   ALT 33 02/12/2022   AST 29 02/12/2022   NA 136 02/13/2022   K 4.0 02/13/2022   CL 103 02/13/2022   CREATININE 0.77 02/13/2022   BUN 14 02/13/2022   CO2 24 02/13/2022   TSH 2.210 11/01/2021   INR 0.9 02/11/2022   HGBA1C 8.0 (H) 02/12/2022     IMAGES: MRI Brain 02/13/22: IMPRESSION: 1. 2.2 cm mass at the left posterior frontal vertex with regional edema. 6 x 8 mm mass at the medial right posterior frontal vertex with minimal edema. These are most likely metastatic lesions. 2. 5 mm vestibular schwannoma remains visible in the right IAC.  CTA Head/Neck  02/12/22: IMPRESSION: 1. No emergent large vessel occlusion or high-grade stenosis of the intracranial arteries. 2. Unchanged appearance of area of vasogenic edema in the left hemisphere. 3. A 8 mm nodules in the right upper and middle lobes, likely metastases. This is more completely characterized on concomitant CT chest, abdomen and pelvis. - Aortic atherosclerosis  (ICD10-I70.0).  CT Chest/Abd/Pelvis 02/12/22: IMPRESSION: 1. 5.4 cm mass in the medial left lower lobe concerning for malignancy. 2. Additional bilateral pulmonary nodules measuring up to 9 mm suspicious for metastases. 3. No evidence of malignancy in the abdomen or pelvis. 4. 4.4 cm gallstone in the gallbladder neck. 5.  Aortic Atherosclerosis (ICD10-I70.0).    EKG: 02/11/22: NSR   CV: Echo 04/08/11: Study Conclusions  Left ventricle: The cavity size was normal. Systolic  function was normal. The estimated ejection fraction was in  the range of 60% to 65%. Wall motion was normal; there were  no regional wall motion abnormalities.   US Carotid 04/08/11: Summary:  No significant extracranial carotid artery stenosis  demonstrated. Veterbrals are patent with antegrade flow.    Past Medical History:  Diagnosis Date   Abscess    hydroadenitis superativa   Anxiety    Diabetes mellitus without complication (HCC)    Hyperinsulinemia    Hypertension 1983   gestational - pre-eclampsia   Neuroma    audiotry neuroma   Seizure (HCC)    Vertigo     Past Surgical History:  Procedure Laterality Date   ABDOMINAL HYSTERECTOMY  2006   remaining left ovary   SHOULDER SURGERY Right 1960s    MEDICATIONS: No current facility-administered medications for this encounter.    albuterol (VENTOLIN HFA) 108 (90 Base) MCG/ACT inhaler   Aspirin-Acetaminophen-Caffeine (GOODYS EXTRA STRENGTH) 500-325-65 MG PACK   clonazePAM (KLONOPIN) 0.5 MG tablet   dexamethasone (DECADRON) 4 MG tablet   levETIRAcetam (KEPPRA) 500 MG tablet   metFORMIN (GLUCOPHAGE) 1000 MG tablet   metFORMIN (GLUCOPHAGE) 850 MG tablet   ondansetron (ZOFRAN) 4 MG tablet   blood glucose meter kit and supplies    Allison Zelenak, PA-C Surgical Short Stay/Anesthesiology [facility] Phone [phone] [facility] Phone [phone] 02/18/2022 2:24 PM        

## 2022-02-18 NOTE — Progress Notes (Signed)
Spoke with pt for pre-op call. Pt recently diagnosed with a brain mass that presented itself by a seizure. Pt's speech is somewhat "choppy". Pt states she has only had the one seizure and it was blinking of eyes rapidly. Pt states she has Hyperinsulemia, A1C was 8.0 02/12/22.   Pt denies cardiac history, states she pre-eclampsia in 1983 with HTN, but never afterwards.   Shower instructions given to pt and she voiced understanding.

## 2022-02-19 ENCOUNTER — Telehealth: Payer: Self-pay | Admitting: Pulmonary Disease

## 2022-02-19 ENCOUNTER — Ambulatory Visit (HOSPITAL_COMMUNITY): Payer: BC Managed Care – PPO

## 2022-02-19 ENCOUNTER — Encounter (HOSPITAL_COMMUNITY)
Admission: RE | Disposition: A | Discharge status: Home / Self Care | Payer: Self-pay | Source: Ambulatory Visit | Attending: Pulmonary Disease

## 2022-02-19 ENCOUNTER — Other Ambulatory Visit: Payer: Self-pay

## 2022-02-19 ENCOUNTER — Ambulatory Visit (HOSPITAL_COMMUNITY): Payer: BC Managed Care – PPO | Admitting: Vascular Surgery

## 2022-02-19 ENCOUNTER — Ambulatory Visit (HOSPITAL_COMMUNITY)
Admission: RE | Admit: 2022-02-19 | Discharge: 2022-02-19 | Disposition: A | Discharge status: Home / Self Care | Payer: BC Managed Care – PPO | Source: Ambulatory Visit | Attending: Pulmonary Disease | Admitting: Pulmonary Disease

## 2022-02-19 ENCOUNTER — Encounter (HOSPITAL_COMMUNITY): Payer: Self-pay | Admitting: Pulmonary Disease

## 2022-02-19 DIAGNOSIS — I10 Essential (primary) hypertension: Secondary | ICD-10-CM | POA: Insufficient documentation

## 2022-02-19 DIAGNOSIS — I509 Heart failure, unspecified: Secondary | ICD-10-CM | POA: Diagnosis not present

## 2022-02-19 DIAGNOSIS — C3490 Malignant neoplasm of unspecified part of unspecified bronchus or lung: Secondary | ICD-10-CM | POA: Diagnosis not present

## 2022-02-19 DIAGNOSIS — Z7984 Long term (current) use of oral hypoglycemic drugs: Secondary | ICD-10-CM | POA: Insufficient documentation

## 2022-02-19 DIAGNOSIS — F419 Anxiety disorder, unspecified: Secondary | ICD-10-CM | POA: Insufficient documentation

## 2022-02-19 DIAGNOSIS — R911 Solitary pulmonary nodule: Secondary | ICD-10-CM | POA: Diagnosis not present

## 2022-02-19 DIAGNOSIS — E119 Type 2 diabetes mellitus without complications: Secondary | ICD-10-CM | POA: Insufficient documentation

## 2022-02-19 DIAGNOSIS — C3432 Malignant neoplasm of lower lobe, left bronchus or lung: Secondary | ICD-10-CM | POA: Diagnosis not present

## 2022-02-19 DIAGNOSIS — Z6836 Body mass index (BMI) 36.0-36.9, adult: Secondary | ICD-10-CM | POA: Diagnosis not present

## 2022-02-19 DIAGNOSIS — Z87891 Personal history of nicotine dependence: Secondary | ICD-10-CM | POA: Diagnosis not present

## 2022-02-19 DIAGNOSIS — R918 Other nonspecific abnormal finding of lung field: Secondary | ICD-10-CM | POA: Diagnosis present

## 2022-02-19 HISTORY — DX: Type 2 diabetes mellitus without complications: E11.9

## 2022-02-19 HISTORY — PX: BRONCHIAL BRUSHINGS: SHX5108

## 2022-02-19 HISTORY — PX: BRONCHIAL NEEDLE ASPIRATION BIOPSY: SHX5106

## 2022-02-19 HISTORY — DX: Unspecified convulsions: R56.9

## 2022-02-19 HISTORY — PX: BRONCHIAL BIOPSY: SHX5109

## 2022-02-19 LAB — GLUCOSE, CAPILLARY
Glucose-Capillary: 198 mg/dL — ABNORMAL HIGH (ref 70–99)
Glucose-Capillary: 213 mg/dL — ABNORMAL HIGH (ref 70–99)

## 2022-02-19 SURGERY — BRONCHOSCOPY, WITH BIOPSY USING ELECTROMAGNETIC NAVIGATION
Anesthesia: General | Laterality: Bilateral

## 2022-02-19 MED ORDER — INSULIN ASPART 100 UNIT/ML IJ SOLN
0.0000 [IU] | INTRAMUSCULAR | Status: DC | PRN
  Filled 2022-02-19: qty 0.14

## 2022-02-19 MED ORDER — ONDANSETRON HCL 4 MG/2ML IJ SOLN
INTRAMUSCULAR | Status: DC | PRN
  Administered 2022-02-19: 4 mg via INTRAVENOUS

## 2022-02-19 MED ORDER — PROPOFOL 500 MG/50ML IV EMUL
INTRAVENOUS | Status: DC | PRN
  Administered 2022-02-19: 100 ug/kg/min via INTRAVENOUS

## 2022-02-19 MED ORDER — ROCURONIUM BROMIDE 10 MG/ML (PF) SYRINGE
PREFILLED_SYRINGE | INTRAVENOUS | Status: DC | PRN
  Administered 2022-02-19: 50 mg via INTRAVENOUS

## 2022-02-19 MED ORDER — LIDOCAINE 2% (20 MG/ML) 5 ML SYRINGE
INTRAMUSCULAR | Status: DC | PRN
  Administered 2022-02-19: 60 mg via INTRAVENOUS

## 2022-02-19 MED ORDER — SUGAMMADEX SODIUM 200 MG/2ML IV SOLN
INTRAVENOUS | Status: DC | PRN
  Administered 2022-02-19: 200 mg via INTRAVENOUS

## 2022-02-19 MED ORDER — FENTANYL CITRATE (PF) 100 MCG/2ML IJ SOLN
INTRAMUSCULAR | Status: DC | PRN
  Administered 2022-02-19 (×2): 25 ug via INTRAVENOUS

## 2022-02-19 MED ORDER — LACTATED RINGERS IV SOLN: INTRAVENOUS | Status: DC

## 2022-02-19 MED ORDER — ACETAMINOPHEN 500 MG PO TABS
1000.0000 mg | ORAL_TABLET | Freq: Once | ORAL | Status: AC
  Administered 2022-02-19: 1000 mg via ORAL
  Filled 2022-02-19: qty 2

## 2022-02-19 MED ORDER — CHLORHEXIDINE GLUCONATE 0.12 % MT SOLN
15.0000 mL | Freq: Once | OROMUCOSAL | Status: AC
  Administered 2022-02-19: 15 mL via OROMUCOSAL
  Filled 2022-02-19 (×2): qty 15

## 2022-02-19 MED ORDER — FENTANYL CITRATE (PF) 100 MCG/2ML IJ SOLN: 25.0000 ug | INTRAMUSCULAR | Status: DC | PRN

## 2022-02-19 MED ORDER — PROPOFOL 10 MG/ML IV BOLUS
INTRAVENOUS | Status: DC | PRN
  Administered 2022-02-19: 20 mg via INTRAVENOUS
  Administered 2022-02-19: 130 mg via INTRAVENOUS
  Administered 2022-02-19: 10 mg via INTRAVENOUS
  Administered 2022-02-19: 20 mg via INTRAVENOUS

## 2022-02-19 MED ORDER — DEXAMETHASONE SODIUM PHOSPHATE 10 MG/ML IJ SOLN
INTRAMUSCULAR | Status: DC | PRN
  Administered 2022-02-19: 4 mg via INTRAVENOUS

## 2022-02-19 SURGICAL SUPPLY — 29 items
BRUSH CYTOL CELLEBRITY 1.5X140 (MISCELLANEOUS) IMPLANT
CANISTER SUCT 3000ML PPV (MISCELLANEOUS) ×3 IMPLANT
CONT SPEC 4OZ CLIKSEAL STRL BL (MISCELLANEOUS) ×3 IMPLANT
COVER BACK TABLE 60X90IN (DRAPES) ×3 IMPLANT
COVER DOME SNAP 22 D (MISCELLANEOUS) ×3 IMPLANT
FORCEPS BIOP RJ4 1.8 (CUTTING FORCEPS) IMPLANT
GAUZE SPONGE 4X4 12PLY STRL (GAUZE/BANDAGES/DRESSINGS) ×3 IMPLANT
GLOVE BIO SURGEON STRL SZ7.5 (GLOVE) ×3 IMPLANT
GOWN STRL REUS W/ TWL LRG LVL3 (GOWN DISPOSABLE) ×3 IMPLANT
GOWN STRL REUS W/TWL LRG LVL3 (GOWN DISPOSABLE) ×2
KIT CLEAN ENDO COMPLIANCE (KITS) ×6 IMPLANT
KIT TURNOVER KIT B (KITS) ×3 IMPLANT
MARKER SKIN DUAL TIP RULER LAB (MISCELLANEOUS) ×3 IMPLANT
NDL EBUS SONO TIP PENTAX (NEEDLE) ×3 IMPLANT
NEEDLE EBUS SONO TIP PENTAX (NEEDLE) ×2 IMPLANT
NS IRRIG 1000ML POUR BTL (IV SOLUTION) ×3 IMPLANT
OIL SILICONE PENTAX (PARTS (SERVICE/REPAIRS)) ×3 IMPLANT
PAD ARMBOARD 7.5X6 YLW CONV (MISCELLANEOUS) ×6 IMPLANT
SOL ANTI FOG 6CC (MISCELLANEOUS) ×3 IMPLANT
SYR 20CC LL (SYRINGE) ×6 IMPLANT
SYR 20ML ECCENTRIC (SYRINGE) ×6 IMPLANT
SYR 50ML SLIP (SYRINGE) IMPLANT
SYR 5ML LUER SLIP (SYRINGE) ×3 IMPLANT
TOWEL OR 17X24 6PK STRL BLUE (TOWEL DISPOSABLE) ×3 IMPLANT
TRAP SPECIMEN MUCOUS 40CC (MISCELLANEOUS) IMPLANT
TUBE CONNECTING 20X1/4 (TUBING) ×6 IMPLANT
UNDERPAD 30X30 (UNDERPADS AND DIAPERS) ×3 IMPLANT
VALVE DISPOSABLE (MISCELLANEOUS) ×3 IMPLANT
WATER STERILE IRR 1000ML POUR (IV SOLUTION) ×3 IMPLANT

## 2022-02-19 NOTE — Telephone Encounter (Signed)
PCCM:  PET scan orders placed MRI brain already complete  Garner Nash, DO [facility] 02/19/2022 9:08 AM

## 2022-02-19 NOTE — Anesthesia Procedure Notes (Signed)
Procedure Name: Intubation Date/Time: 02/19/2022 7:54 AM  Performed by: Wilburn Cornelia, CRNAPre-anesthesia Checklist: Patient identified, Emergency Drugs available, Suction available, Patient being monitored and Timeout performed Patient Re-evaluated:Patient Re-evaluated prior to induction Oxygen Delivery Method: Circle system utilized Preoxygenation: Pre-oxygenation with 100% oxygen Induction Type: IV induction Ventilation: Mask ventilation without difficulty Laryngoscope Size: Mac and 3 Grade View: Grade III Tube type: Oral Tube size: 8.5 mm Number of attempts: 1 Airway Equipment and Method: Stylet Placement Confirmation: ETT inserted through vocal cords under direct vision, positive ETCO2, CO2 detector and breath sounds checked- equal and bilateral Secured at: 22 cm Tube secured with: Tape Dental Injury: Teeth and Oropharynx as per pre-operative assessment

## 2022-02-19 NOTE — Anesthesia Postprocedure Evaluation (Signed)
Anesthesia Post Note  Patient: ABREANNA DRAWDY  Procedure(s) Performed: ROBOTIC ASSISTED NAVIGATIONAL BRONCHOSCOPY (Bilateral) BRONCHIAL NEEDLE ASPIRATION BIOPSIES BRONCHIAL BIOPSIES BRONCHIAL BRUSHINGS     Patient location during evaluation: PACU Anesthesia Type: General Level of consciousness: awake and alert Pain management: pain level controlled Vital Signs Assessment: post-procedure vital signs reviewed and stable Respiratory status: spontaneous breathing, nonlabored ventilation and respiratory function stable Cardiovascular status: blood pressure returned to baseline and stable Postop Assessment: no apparent nausea or vomiting Anesthetic complications: no  No notable events documented.  Last Vitals:  Vitals:   02/19/22 0945 02/19/22 1000  BP: (!) 119/48 128/64  Pulse: (!) 48 (!) 55  Resp: 17 11  Temp:  36.6 C  SpO2: 93% 93%    Last Pain:  Vitals:   02/19/22 1000  TempSrc:   PainSc: 0-No pain                 Garron Eline,W. EDMOND

## 2022-02-19 NOTE — Op Note (Signed)
Video Bronchoscopy with Robotic Assisted Bronchoscopic Navigation   Date of Operation: 02/19/2022   Pre-op Diagnosis: Left lower lobe lung mass  Post-op Diagnosis: Left lower lobe lung mass  Surgeon: Garner Nash, DO  Assistants: none   Anesthesia: General endotracheal anesthesia  Operation: Flexible video fiberoptic bronchoscopy with robotic assistance and biopsies.  Estimated Blood Loss: Minimal  Complications: None  Indications and History: Carolyn Hooper is a 61 y.o. female with history of left lower lobe lung mass. The risks, benefits, complications, treatment options and expected outcomes were discussed with the patient.  The possibilities of pneumothorax, pneumonia, reaction to medication, pulmonary aspiration, perforation of a viscus, bleeding, failure to diagnose a condition and creating a complication requiring transfusion or operation were discussed with the patient who freely signed the consent.    Description of Procedure: The patient was seen in the Preoperative Area, was examined and was deemed appropriate to proceed.  The patient was taken to [facility] endoscopy room 3, identified as Carolyn Hooper and the procedure verified as Flexible Video Fiberoptic Bronchoscopy.  A Time Out was held and the above information confirmed.   Prior to the date of the procedure a high-resolution CT scan of the chest was performed. Utilizing ION software program a virtual tracheobronchial tree was generated to allow the creation of distinct navigation pathways to the patient's parenchymal abnormalities. After being taken to the operating room general anesthesia was initiated and the patient  was orally intubated. The video fiberoptic bronchoscope was introduced via the endotracheal tube and a general inspection was performed which showed normal right and left lung anatomy, aspiration of the bilateral mainstems was completed to remove any remaining secretions. Robotic catheter inserted into  patient's endotracheal tube.   Target #1 left lower lobe lung mass: The distinct navigation pathways prepared prior to this procedure were then utilized to navigate to patient's lesion identified on CT scan. The robotic catheter was secured into place and the vision probe was withdrawn.  Lesion location was approximated using fluoroscopy and three-dimensional cone beam CT imaging for peripheral targeting. Under fluoroscopic guidance transbronchial needle brushings, transbronchial needle biopsies, and transbronchial forceps biopsies were performed to be sent for cytology and pathology.   At the end of the procedure a general airway inspection was performed and there was no evidence of active bleeding. The bronchoscope was removed.  The patient tolerated the procedure well. There was no significant blood loss and there were no obvious complications. A post-procedural chest x-ray is pending.  Samples Target #1: 1. Transbronchial needle brushings from left lower lobe 2. Transbronchial Wang needle biopsies from Left lower lobe 3. Transbronchial forceps biopsies from left lower lobe  Plans:  The patient will be discharged from the PACU to home when recovered from anesthesia and after chest x-ray is reviewed. We will review the cytology, pathology results with the patient when they become available. Outpatient followup will be with Dr. Julien Nordmann.   Garner Nash, DO [facility] 02/19/2022 9:04 AM

## 2022-02-19 NOTE — Telephone Encounter (Signed)
PET scan has been scheduled for 12/20.

## 2022-02-19 NOTE — Transfer of Care (Signed)
Immediate Anesthesia Transfer of Care Note  Patient: Carolyn Hooper  Procedure(s) Performed: ROBOTIC ASSISTED NAVIGATIONAL BRONCHOSCOPY (Bilateral) BRONCHIAL NEEDLE ASPIRATION BIOPSIES BRONCHIAL BIOPSIES BRONCHIAL BRUSHINGS  Patient Location: PACU  Anesthesia Type:General  Level of Consciousness: awake, alert , and oriented  Airway & Oxygen Therapy: Patient Spontanous Breathing and Patient connected to nasal cannula oxygen  Post-op Assessment: Report given to RN and Post -op Vital signs reviewed and stable  Post vital signs: Reviewed and stable  Last Vitals:  Vitals Value Taken Time  BP 156/69 02/19/22 0900  Temp    Pulse 67 02/19/22 0900  Resp 11 02/19/22 0900  SpO2 94 % 02/19/22 0900  Vitals shown include unvalidated device data.  Last Pain:  Vitals:   02/19/22 0633  TempSrc:   PainSc: 0-No pain         Complications: No notable events documented.

## 2022-02-19 NOTE — Interval H&P Note (Signed)
History and Physical Interval Note:  02/19/2022 7:38 AM  Carolyn Hooper  has presented today for surgery, with the diagnosis of lung mass.  The various methods of treatment have been discussed with the patient and family. After consideration of risks, benefits and other options for treatment, the patient has consented to  Procedure(s): ROBOTIC ASSISTED NAVIGATIONAL BRONCHOSCOPY (Bilateral) VIDEO BRONCHOSCOPY WITH ENDOBRONCHIAL ULTRASOUND (Bilateral) as a surgical intervention.  The patient's history has been reviewed, patient examined, no change in status, stable for surgery.  I have reviewed the patient's chart and labs.  Questions were answered to the patient's satisfaction.     [place]

## 2022-02-19 NOTE — Discharge Instructions (Signed)
Flexible Bronchoscopy, Care After This sheet gives you information about how to care for yourself after your test. Your doctor may also give you more specific instructions. If you have problems or questions, contact your doctor. Follow these instructions at home: Eating and drinking Do not eat or drink anything (not even water) for 2 hours after your test, or until your numbing medicine (local anesthetic) wears off. When your numbness is gone and your cough and gag reflexes have come back, you may: Eat only soft foods. Slowly drink liquids. The day after the test, go back to your normal diet. Driving Do not drive for 24 hours if you were given a medicine to help you relax (sedative). Do not drive or use heavy machinery while taking prescription pain medicine. General instructions  Take over-the-counter and prescription medicines only as told by your doctor. Return to your normal activities as told. Ask what activities are safe for you. Do not use any products that have nicotine or tobacco in them. This includes cigarettes and e-cigarettes. If you need help quitting, ask your doctor. Keep all follow-up visits as told by your doctor. This is important. It is very important if you had a tissue sample (biopsy) taken. Get help right away if: You have shortness of breath that gets worse. You get light-headed. You feel like you are going to pass out (faint). You have chest pain. You cough up: More than a little blood. More blood than before. Summary Do not eat or drink anything (not even water) for 2 hours after your test, or until your numbing medicine wears off. Do not use cigarettes. Do not use e-cigarettes. Get help right away if you have chest pain.  This information is not intended to replace advice given to you by your health care provider. Make sure you discuss any questions you have with your health care provider. Document Released: 12/30/2008 Document Revised: 02/14/2017 Document  Reviewed: 03/22/2016 Elsevier Patient Education  2020 [employer].

## 2022-02-20 ENCOUNTER — Telehealth: Payer: Self-pay | Admitting: Internal Medicine

## 2022-02-20 ENCOUNTER — Telehealth: Payer: Self-pay | Admitting: Medical Oncology

## 2022-02-20 NOTE — Telephone Encounter (Signed)
Scheduled appointment per referral. Patient is aware of appointment date and time. Patient is aware to arrive 15 mins prior to appointment time and to bring updated insurance cards. Patient is aware of location.   

## 2022-02-20 NOTE — Telephone Encounter (Signed)
LVM for pt -she can take her seizure medicine as scheduled on the day of her PET scan . However, she cannot take her metformin after midnight before her PET scan.

## 2022-02-21 ENCOUNTER — Telehealth: Payer: Self-pay | Admitting: Family

## 2022-02-22 ENCOUNTER — Other Ambulatory Visit: Payer: Self-pay | Admitting: Medical Oncology

## 2022-02-22 DIAGNOSIS — R918 Other nonspecific abnormal finding of lung field: Secondary | ICD-10-CM

## 2022-02-22 NOTE — Telephone Encounter (Signed)
Pt aware all her ppw is on her PCPs desk and that she has been out of the office and will return on Monday. Pt has her first Oncology appt on Monday.

## 2022-02-24 ENCOUNTER — Encounter (HOSPITAL_COMMUNITY): Payer: Self-pay | Admitting: Pulmonary Disease

## 2022-02-24 DIAGNOSIS — C3490 Malignant neoplasm of unspecified part of unspecified bronchus or lung: Secondary | ICD-10-CM | POA: Diagnosis not present

## 2022-02-24 NOTE — Progress Notes (Unsigned)
[facility] [phone]   [phone]  CONSULT NOTE  REFERRING PHYSICIAN: Dr. Valeta Harms  REASON FOR CONSULTATION:  Non-small cell lung cancer   HPI Carolyn Hooper is a 60 y.o. 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  The patient's evaluation began on 02/11/2022 in which she presented to the emergency room for code stroke. She reports she was at home and blinking uncontrollably. She tried to express this to her 62 year old housemate but was unable to verbalize that she needed help.  Due to the aphagia, she had a CT scan of the head performed which led to a brain MRI which showed a left posterior  frontal lobe mass with regional edema and an additional mass at the medial right posterior frontal cortex.   She subsequently had a CT scan of the chest, abdomen, and pelvis which showed a 5.4 cm mass in the medial left lower lobe concerning for malignancy and additional bilateral pulmonary nodules suspicious for metastases.  There is no evidence of malignancy in the abdomen or pelvis.  The patient's staging PET scan is scheduled for next week on 03/06/2022.  The patient underwent bronchoscopy and biopsy under the care of Dr. Valeta Harms on 02/19/2022. The final pathology was consistent with NSCLC, however, the IHC stains are pending.   Overall, the patient is feeling fair. She denies any unusual fatigue due to the steroid helping her energy. Her speech is about 70-80% back to her baseline. She denies any headaches recently. However, if she gets a headache, she is wondering what she would take.   She lost about 35 lbs gradually over the course of the 1.5 years. She denies night sweats.  She denies any chills or fevers.  She denies any cough or shortness of breath.  She had 1 episode of hemoptysis  with old/dark blood last night.  She is unsure if this came from her lungs or sinuses.  Denies any chest pain.  Denies any nausea, vomiting, diarrhea, or constipation.   The patient is accompanied by her mother today.  The patient has a younger sister with melanoma and lymphoma.  The patient states she has an uncle with colorectal cancer.  She is another uncle with colorectal cancer.  Her maternal grandmother had breast cancer.  Her paternal uncle has renal cell carcinoma.  The patient is a former smoker having smoked approximately 40 to 50 years averaging 1 to 1.5 packs of cigarettes per day.  Denies any alcohol or drug use.  The patient is divorced.  She has 1 son who lives down the street from her.  She works at a [employer] company and she is working on her short-term disability paperwork.    HPI  Past Medical History:  Diagnosis Date   Abscess    hydroadenitis superativa   Anxiety    Diabetes mellitus without complication ([place])    Hyperinsulinemia    Hypertension 1983   gestational - pre-eclampsia   Neuroma    audiotry neuroma   Seizure [facility])    Vertigo     Past Surgical History:  Procedure Laterality Date   ABDOMINAL HYSTERECTOMY  2006   remaining left ovary   BRONCHIAL BIOPSY  02/19/2022   Procedure: BRONCHIAL BIOPSIES;  Surgeon: Garner Nash, DO;  Location: [facility];  Service: Pulmonary;;   BRONCHIAL BRUSHINGS  02/19/2022   Procedure: BRONCHIAL BRUSHINGS;  Surgeon: Garner Nash, DO;  Location: [facility];  Service: Pulmonary;;   BRONCHIAL NEEDLE ASPIRATION BIOPSY  02/19/2022   Procedure: BRONCHIAL NEEDLE ASPIRATION BIOPSIES;  Surgeon: Garner Nash, DO;  Location: [facility];  Service: Pulmonary;;   SHOULDER SURGERY Right 1960s    Family History  Problem Relation Age of Onset   COPD Mother    Heart disease Father    Diabetes Father    Kidney disease Father    Arthritis Father        RA   Liver disease Father     Social History Social History    Tobacco Use   Smoking status: Former    Packs/day: 0.75    Types: Cigarettes    Quit date: 02/11/2022    Years since quitting: 0.0   Smokeless tobacco: Never  Vaping Use   Vaping Use: Never used  Substance Use Topics   Alcohol use: Yes    Comment: very rare   Drug use: No    Allergies  Allergen Reactions   Adhesive [Tape] Dermatitis   Statins Other (See Comments)    Myalgia, itching    Current Outpatient Medications  Medication Sig Dispense Refill   albuterol (VENTOLIN HFA) 108 (90 Base) MCG/ACT inhaler Inhale 2 puffs into the lungs every 6 (six) hours as needed. 18 g 2   Aspirin-Acetaminophen-Caffeine (GOODYS EXTRA STRENGTH) 500-325-65 MG PACK Take 1 packet by mouth daily as needed (headaches).     blood glucose meter kit and supplies Dispense based on patient and insurance preference. Use up to four times daily as directed. (FOR ICD-10 E10.9, E11.9). 1 each 0   clonazePAM (KLONOPIN) 0.5 MG tablet Take 0.5 tablets (0.25 mg total) by mouth at bedtime. For anxiety (Patient taking differently: Take 0.25 mg by mouth daily as needed for anxiety (and vertigo).) 30 tablet 2   metFORMIN (GLUCOPHAGE) 1000 MG tablet Take 1 tablet (1,000 mg total) by mouth 2 (two) times daily. (Patient taking differently: Take 500-1,000 mg by mouth See admin instructions. 500 mg or 1000 mg with meals depending on how much carbs and sugars Pt eats) 180 tablet 1   metFORMIN (GLUCOPHAGE) 850 MG tablet Take 425-850 mg by mouth See admin instructions. 425 mg - 850 mg with meals, depending on how much carbs and sugars Pt eats     prochlorperazine (COMPAZINE) 10 MG tablet Take 1 tablet (10 mg total) by mouth every 6 (six) hours as needed. 30 tablet 2   dexamethasone (DECADRON) 4 MG tablet Take 1 tablet (4 mg total) by mouth 2 (two) times daily with a meal. 28 tablet 0   levETIRAcetam (KEPPRA) 500 MG tablet Take 1 tablet (500 mg total) by mouth 2 (two) times daily. 60 tablet 0   ondansetron (ZOFRAN) 4 MG tablet  Take 1 tablet (4 mg total) by mouth every 6 (six) hours as needed for nausea. (Patient not taking: Reported on 02/25/2022) 20 tablet 0   No current facility-administered medications for this visit.    REVIEW OF SYSTEMS:   Review of Systems  Constitutional: Positive for weight loss. Negative for appetite change, chills, fatigue, and fever.  HENT: Negative for mouth sores, nosebleeds, sore throat and trouble swallowing.   Eyes: Negative for eye problems and icterus.  Respiratory: Negative for cough, hemoptysis, shortness of breath and wheezing.   Cardiovascular: Negative for chest pain and leg swelling.  Gastrointestinal: Negative for abdominal pain, constipation, diarrhea, nausea and vomiting.  Genitourinary: Negative  for bladder incontinence, difficulty urinating, dysuria, frequency and hematuria.   Musculoskeletal: Negative for back pain, gait problem, neck pain and neck stiffness.  Skin: Negative for itching and rash.  Neurological: Negative for dizziness, extremity weakness, gait problem, headaches, light-headedness and seizures.  Hematological: Negative for adenopathy. Does not bruise/bleed easily.  Psychiatric/Behavioral: Positive for improving speech disturbance. Negative for confusion, depression and sleep disturbance. The patient is not nervous/anxious.     PHYSICAL EXAMINATION:  Blood pressure (!) 146/54, pulse (!) 55, temperature 97.7 F (36.5 C), temperature source Oral, resp. rate 16, weight 235 lb 3.2 oz (106.7 kg), SpO2 94 %.  ECOG PERFORMANCE STATUS: 1  Physical Exam  Constitutional: Oriented to person, place, and time and well-developed, well-nourished, and in no distress.  HENT:  Head: Normocephalic and atraumatic.  Mouth/Throat: Oropharynx is clear and moist. No oropharyngeal exudate.  Eyes: Conjunctivae are normal. Right eye exhibits no discharge. Left eye exhibits no discharge. No scleral icterus.  Neck: Normal range of motion. Neck supple.  Cardiovascular:  Normal rate, regular rhythm, normal heart sounds and intact distal pulses.   Pulmonary/Chest: Effort normal quiet breath sounds bilaterally. No respiratory distress. No wheezes. No rales.  Abdominal: Soft. Bowel sounds are normal. Exhibits no distension and no mass. There is no tenderness.  Musculoskeletal: Normal range of motion. Exhibits no edema.  Lymphadenopathy:    No cervical adenopathy.  Neurological: Alert and oriented to person, place, and time. Exhibits normal muscle tone. Gait normal. Coordination normal.  Skin: Skin is warm and dry. No rash noted. Not diaphoretic. No erythema. No pallor.  Psychiatric: Mood, memory and judgment normal.  Vitals reviewed.  LABORATORY DATA: Lab Results  Component Value Date   WBC 19.8 (H) 02/25/2022   HGB 16.4 (H) 02/25/2022   HCT 48.7 (H) 02/25/2022   MCV 85.4 02/25/2022   PLT 253 02/25/2022      Chemistry      Component Value Date/Time   NA 138 02/25/2022 1300   NA 141 11/01/2021 1009   K 4.1 02/25/2022 1300   CL 101 02/25/2022 1300   CO2 28 02/25/2022 1300   BUN 23 02/25/2022 1300   BUN 13 11/01/2021 1009   CREATININE 0.76 02/25/2022 1300      Component Value Date/Time   CALCIUM 10.0 02/25/2022 1300   ALKPHOS 55 02/25/2022 1300   AST 24 02/25/2022 1300   ALT 68 (H) 02/25/2022 1300   BILITOT 0.5 02/25/2022 1300       RADIOGRAPHIC STUDIES: DG Chest Port 1 View  Result Date: 02/19/2022 CLINICAL DATA:  Congestive heart failure EXAM: PORTABLE CHEST 1 VIEW COMPARISON:  Chest radiograph, 10/23/2004 CT chest abdomen pelvis, 02/12/2022 FINDINGS: The heart size and mediastinal contours are within normal limits. Medial left lower lobe mass and multiple small bilateral pulmonary nodules better appreciated by prior CT. No acute appearing airspace opacity. The visualized skeletal structures are unremarkable. IMPRESSION: 1. Medial left lower lobe mass and multiple small bilateral pulmonary nodules better appreciated by prior CT. 2.  No acute  appearing airspace opacity. Electronically Signed   By: Delanna Ahmadi M.D.   On: 02/19/2022 10:07   DG C-ARM BRONCHOSCOPY  Result Date: 02/19/2022 C-ARM BRONCHOSCOPY: Fluoroscopy was utilized by the requesting physician.  No radiographic interpretation.   MR BRAIN W WO CONTRAST  Result Date: 02/13/2022 CLINICAL DATA:  Assess for intracranial metastatic disease. History of right vestibular schwannoma. Recent abnormal neuro imaging with edema in the left brain. EXAM: MRI HEAD WITHOUT AND WITH CONTRAST TECHNIQUE: Multiplanar, multiecho  pulse sequences of the brain and surrounding structures were obtained without and with intravenous contrast. CONTRAST:  94m GADAVIST GADOBUTROL 1 MMOL/ML IV SOLN COMPARISON:  CT yesterday.  MRI 01/26/2021 FINDINGS: Brain: There is a lobular up to 2.2 cm in diameter mass at the left posterior frontal vertex with regional edema. There is a second 6 x 8 mm mass in the medial right posterior frontal vertex with minimal edema. These are most likely to be metastatic lesions. No evidence of ischemic change within the brain. Approximately 5 mm vestibular schwannoma remains visible in the right IAC. No hydrocephalus. No extra-axial collection. No evidence of hemorrhage or shift. Vascular: Major vessels at the base of the brain show flow. Skull and upper cervical spine: Negative Sinuses/Orbits: Clear/normal Other: None IMPRESSION: 1. 2.2 cm mass at the left posterior frontal vertex with regional edema. 6 x 8 mm mass at the medial right posterior frontal vertex with minimal edema. These are most likely metastatic lesions. 2. 5 mm vestibular schwannoma remains visible in the right IAC. Electronically Signed   By: MNelson ChimesM.D.   On: 02/13/2022 15:19   CT ANGIO HEAD NECK W WO CM  Result Date: 02/12/2022 CLINICAL DATA:  Stroke follow-up EXAM: CT ANGIOGRAPHY HEAD AND NECK TECHNIQUE: Multidetector CT imaging of the head and neck was performed using the standard protocol during bolus  administration of intravenous contrast. Multiplanar CT image reconstructions and MIPs were obtained to evaluate the vascular anatomy. Carotid stenosis measurements (when applicable) are obtained utilizing NASCET criteria, using the distal internal carotid diameter as the denominator. RADIATION DOSE REDUCTION: This exam was performed according to the departmental dose-optimization program which includes automated exposure control, adjustment of the mA and/or kV according to patient size and/or use of iterative reconstruction technique. CONTRAST:  104mOMNIPAQUE IOHEXOL 350 MG/ML SOLN COMPARISON:  Head CT 02/12/2019 FINDINGS: CTA NECK FINDINGS SKELETON: There is no bony spinal canal stenosis. No lytic or blastic lesion. OTHER NECK: Normal pharynx, larynx and major salivary glands. No cervical lymphadenopathy. Unremarkable thyroid gland. UPPER CHEST: 8 mm nodules in the right upper and middle lobes AORTIC ARCH: There is calcific atherosclerosis of the aortic arch. There is no aneurysm, dissection or hemodynamically significant stenosis of the visualized portion of the aorta. Conventional 3 vessel aortic branching pattern. The visualized proximal subclavian arteries are widely patent. RIGHT CAROTID SYSTEM: Normal without aneurysm, dissection or stenosis. LEFT CAROTID SYSTEM: Normal without aneurysm, dissection or stenosis. VERTEBRAL ARTERIES: Left dominant configuration. Both origins are clearly patent. There is no dissection, occlusion or flow-limiting stenosis to the skull base (V1-V3 segments). CTA HEAD FINDINGS Unchanged appearance of area of vasogenic edema in the left hemisphere. POSTERIOR CIRCULATION: --Vertebral arteries: Normal V4 segments. --Inferior cerebellar arteries: Normal. --Basilar artery: Normal. --Superior cerebellar arteries: Normal. --Posterior cerebral arteries (PCA): Normal. ANTERIOR CIRCULATION: --Intracranial internal carotid arteries: Normal. --Anterior cerebral arteries (ACA): Normal. Both A1  segments are present. Patent anterior communicating artery (a-comm). --Middle cerebral arteries (MCA): Normal. VENOUS SINUSES: As permitted by contrast timing, patent. ANATOMIC VARIANTS: None Review of the MIP images confirms the above findings. IMPRESSION: 1. No emergent large vessel occlusion or high-grade stenosis of the intracranial arteries. 2. Unchanged appearance of area of vasogenic edema in the left hemisphere. 3. A 8 mm nodules in the right upper and middle lobes, likely metastases. This is more completely characterized on concomitant CT chest, abdomen and pelvis. Aortic atherosclerosis (ICD10-I70.0). Electronically Signed   By: KeUlyses Jarred.D.   On: 02/12/2022 01:39   CT CHEST  ABDOMEN PELVIS W CONTRAST  Result Date: 02/12/2022 CLINICAL DATA:  Brain tumor seen on CT query metastatic disease EXAM: CT CHEST, ABDOMEN, AND PELVIS WITH CONTRAST TECHNIQUE: Multidetector CT imaging of the chest, abdomen and pelvis was performed following the standard protocol during bolus administration of intravenous contrast. RADIATION DOSE REDUCTION: This exam was performed according to the departmental dose-optimization program which includes automated exposure control, adjustment of the mA and/or kV according to patient size and/or use of iterative reconstruction technique. CONTRAST:  133m OMNIPAQUE IOHEXOL 350 MG/ML SOLN COMPARISON:  None Available. FINDINGS: CT CHEST FINDINGS Cardiovascular: Coronary artery calcifications. Aortic arch calcifications. Normal heart size. No pericardial effusion. Mediastinum/Nodes: No enlarged mediastinal, hilar, or axillary lymph nodes. Thyroid gland, trachea, and esophagus demonstrate no significant findings. Lungs/Pleura: Numerous bilateral small pulmonary nodules with the largest measuring 9 mm in the lateral right upper lobe (4/41). Irregular mass along the left lower lobe medially measures 5.4 x 4.1 by 5.4 cm. This obstructs multiple segmental right lower lobe bronchi. No  pleural effusion or pneumothorax. Musculoskeletal: No chest wall mass or suspicious bone lesions identified. CT ABDOMEN PELVIS FINDINGS Hepatobiliary: Large 4.4 cm gallstone in the gallbladder neck. No evidence of cholecystitis. No biliary dilation. No suspicious hepatic lesion. Pancreas: Unremarkable. Spleen: Unremarkable. Adrenals/Urinary Tract: Normal adrenal glands. No suspicious renal mass. No hydronephrosis or urinary calculi. Unremarkable bladder. Stomach/Bowel: Normal caliber large and small bowel. Colonic diverticulosis without diverticulitis. Normal appendix. Unremarkable stomach. Vascular/Lymphatic: Aortic athero sclerotic calcification. There are few prominent subcentimeter upper retroperitoneal nodes (for example precaval node measuring 8 mm in short axis (3/61). Reproductive: Hysterectomy.  No suspicious adnexal mass. Other: No free intraperitoneal fluid or air. Musculoskeletal: No acute osseous abnormality or destructive osseous lesion. Thoracolumbar spondylosis. Sclerosis in the left femoral head has a benign appearance. IMPRESSION: 1. 5.4 cm mass in the medial left lower lobe concerning for malignancy. 2. Additional bilateral pulmonary nodules measuring up to 9 mm suspicious for metastases. 3. No evidence of malignancy in the abdomen or pelvis. 4. 4.4 cm gallstone in the gallbladder neck. 5.  Aortic Atherosclerosis (ICD10-I70.0). Electronically Signed   By: TPlacido SouM.D.   On: 02/12/2022 01:31   CT HEAD CODE STROKE WO CONTRAST  Result Date: 02/11/2022 CLINICAL DATA:  Code stroke. EXAM: CT HEAD WITHOUT CONTRAST TECHNIQUE: Contiguous axial images were obtained from the base of the skull through the vertex without intravenous contrast. RADIATION DOSE REDUCTION: This exam was performed according to the departmental dose-optimization program which includes automated exposure control, adjustment of the mA and/or kV according to patient size and/or use of iterative reconstruction technique.  COMPARISON:  Brain MRI 01/25/2021 FINDINGS: Brain: There is a large area of vasogenic edema within the left frontal and parietal lobes. On the right, there is a punctate hyperdense focus with adjacent vasogenic edema at the superior paramedian right frontal lobe (3:30). No midline shift. Vascular: No abnormal hyperdensity of the major intracranial arteries or dural venous sinuses. No intracranial atherosclerosis. Skull: The visualized skull base, calvarium and extracranial soft tissues are normal. Sinuses/Orbits: No fluid levels or advanced mucosal thickening of the visualized paranasal sinuses. No mastoid or middle ear effusion. The orbits are normal. IMPRESSION: Large area of vasogenic edema in the left frontal/parietal lobe with overlying subarachnoid blood. Second, smaller lesion in the superior right frontal lobe. To gather appearance is concerning for metastatic disease. MRI of the brain with and without contrast is suggested. Critical Value/emergent results were called by telephone at the time of interpretation on 02/11/2022 at 10:53  pm to provider [name] TRIFAN , who verbally acknowledged these results. Electronically Signed   By: Ulyses Jarred M.D.   On: 02/11/2022 22:53    ASSESSMENT: This is a very pleasant 61 year old Caucasian female diagnosed with stage IV non-small cell lung cancer, pending IHC (T3, N0, M1b).  She is presented with a 5.4 cm mass in the medial left lower lobe with bilateral pulmonary nodules and metastatic disease to the brain.  She was diagnosed in November 2023.  Her Guardant360 molecular test were negative for any actionable mutations.  The patient is scheduled for her staging PET scan on 03/06/2022.  The patient was seen with Dr. Julien Nordmann today.  Dr. Julien Nordmann had a lengthy discussion with the patient today about her current condition and recommended treatment options.  Dr. Julien Nordmann gave the patient the option of a referral to palliative care/hospice versus starting on  systemic treatment with therapy and immunotherapy.  The patient is interested in starting systemic treatment.  We will wait for the results of her final pathology before determining her systemic treatment which will either be carboplatin for an AUC of 5, Alimta 500 mg/m, and Keytruda 200 mg IV every 3 weeks versus carboplatin for an AUC of 5, paclitaxel 175 mg/m, Keytruda 200 mg IV every 3 weeks with Neulasta support.    The patient is interested in this option and she is expected to undergo her first cycle of treatment next week on 03/06/2022.  The adverse side effects of treatment were discussed including but not limited to myelosuppression, alopecia, nausea, vomiting, kidney, liver dysfunction, and fatigue.  I discussed with her the adverse effect of the immunotherapy including but not limited to immunotherapy mediated skin rash, diarrhea, inflammation of the lung, kidney, liver, thyroid or other endocrine dysfunction   I will arrange for chemo education class prior to receiving her first cycle of treatment.  I sent a prescription for Compazine 10 mg p.o. every 6 hours as needed to the patient's pharmacy for nausea and vomiting.  We will refer the patient to radiation oncology and neuro-oncology with Dr. Mickeal Skinner regarding the metastatic brain lesions.  I have refilled her Decadron and Keppra until she can be seen by radiation oncology and neuro-oncology.  Due to the patient's reported seizure, I discussed with patient she should not be driving at this time.  We have placed a referral to the social worker to discuss with social services she qualifies for as well as to discuss transportation services.  The patient was given a handout on the contact info for the [facility] department.  Patient has a history of plaque psoriasis.  Dr. Julien Nordmann discussed that her plaque psoriasis can be flared up with immunotherapy.  If the patient's final pathology is consistent with adenocarcinoma, I will arrange for her  to receive a B12 injection at her chemo education appointment.  I will also need to send her prescription for folic acid 1 mg p.o. daily.   For any headaches, discussed that Tylenol is preferred.  The patient reports that her pulse has been in the 18s recently.  She is going to schedule follow-up visit with her PCP.  We will see the patient back for follow-up visit 1 week after her first infusion to manage any adverse side effects of treatment.  The patient voices understanding of current disease status and treatment options and is in agreement with the current care plan.  All questions were answered. The patient knows to call the clinic with any problems, questions or concerns.  We can certainly see the patient much sooner if necessary.  Thank you so much for allowing me to participate in the care of Carolyn Hooper. I will continue to follow up the patient with you and assist in her care.  Disclaimer: This note was dictated with voice recognition software. Similar sounding words can inadvertently be transcribed and may not be corrected upon review.   Nella Botsford L Tiger Spieker February 25, 2022, 2:44 PM  ADDENDUM: Hematology/Oncology Attending: I had a face-to-face encounter with the patient today.  I reviewed her records, lab, scan and recommended her care plan.  This is a very pleasant 61 years old white female with long history for smoking as well as black psoriasis of the scalp, acoustic schwannoma as well as anxiety and hyperinsulinemia.  The patient mentioned that on February 11, 2022 she presented to the emergency department for suspicious stroke when she was home with uncontrolled blinking and lack of communication with some confusion and aphasia.  She presented to the emergency department and at that time she had CT scan of the head followed by MRI of the brain that showed left posterior frontal lobe mass with regional edema and additional mass at the medial right posterior frontal  cortex.  This was suspicious for metastatic disease to the brain.  The patient started treatment with Keppra and Decadron but she also had restaging imaging studies including CT scan of the chest, abdomen and pelvis on 11/11/2021 and it showed irregular mass along the left lower lobe medially measuring 5.4 x 4.1 x 5.4 cm obstructing multiple segmental left lower lobe bronchi.  There was also numerous bilateral small pulmonary nodules with the largest measuring 0.9 cm in the lateral right upper lobe.  There was no enlarged mediastinal, hilar or axillary lymph nodes.  The patient was seen by Dr. Valeta Harms and on February 19, 2022 she underwent flexible video fiberoptic bronchoscopy with robotic assistance and biopsies.  The preliminary pathology is consistent with non-small cell lung cancer pending immunohistochemical stains for the final diagnosis.  She has molecular studies by [employer] that showed no actionable mutations but the tissue molecular studies as well as PD-L1 expression are still pending The patient was referred to [place] today for evaluation and recommendation regarding her condition.  She is almost running out of her medication with Keppra. I had a lengthy discussion with the patient and her mother today about her current condition and treatment options. This is a very pleasant 61 years old white female with a stage IV (T3, N0, M1b) non-small cell lung cancer pending immunohistochemical stains for the subtype diagnosed in December 2023 and presented with large left lower lobe lung mass in addition to bilateral pulmonary nodules and metastatic disease to the brain. I recommended for the patient to complete the staging workup with a PET scan which is scheduled to be done on March 06, 2022. I explained to the patient that she has incurable condition and all the treatment will be of palliative nature. I will refer her to radiation oncology for palliative radiotherapy to the metastatic brain lesion likely  with SRS. I also discussed with the patient her systemic treatment options and she was given the option of palliative systemic treatment with platinum based chemotherapy in addition to immunotherapy versus palliative care and hospice.  She is interested in treatment and we are waiting for the final histology to decide the chemotherapy combination with her with carboplatin and Alimta for adenocarcinoma or carboplatin and paclitaxel for squamous cell carcinoma in  addition to [facility] as the immunotherapy agent. The patient was advised of the adverse effect of this treatment including but not limited to alopecia, myelosuppression, nausea and vomiting, peripheral neuropathy, liver or renal dysfunction as well as immunotherapy adverse effects. I will be happy to arrange for her first cycle of the treatment to start next week once the final pathology becomes available. She will have a chemotherapy education class before the first dose of treatment. Will call her pharmacy with prescription for Compazine 10 mg p.o. every 6 hours as needed for nausea and will also add folic acid and vitamin B12 injection if the final pathology was consistent with adenocarcinoma. The patient will come back for follow-up visit 1 week after the start of her treatment for management of any adverse effect of her treatment. She was advised to call immediately if she has any other concerning symptoms in the interval.  The total time spent in the appointment was 90 minutes. Disclaimer: This note was dictated with voice recognition software. Similar sounding words can inadvertently be transcribed and may be missed upon review. Eilleen Kempf, MD  I explained to the patient that she has incurable condition and all the treatment will be of palliative nature

## 2022-02-25 ENCOUNTER — Other Ambulatory Visit: Payer: Self-pay

## 2022-02-25 ENCOUNTER — Inpatient Hospital Stay: Payer: BC Managed Care – PPO | Admitting: Physician Assistant

## 2022-02-25 ENCOUNTER — Inpatient Hospital Stay: Payer: BC Managed Care – PPO | Attending: Physician Assistant

## 2022-02-25 VITALS — BP 146/54 | HR 55 | Temp 97.7°F | Resp 16 | Wt 235.2 lb

## 2022-02-25 DIAGNOSIS — C3432 Malignant neoplasm of lower lobe, left bronchus or lung: Secondary | ICD-10-CM | POA: Insufficient documentation

## 2022-02-25 DIAGNOSIS — Z5112 Encounter for antineoplastic immunotherapy: Secondary | ICD-10-CM | POA: Diagnosis not present

## 2022-02-25 DIAGNOSIS — Z5111 Encounter for antineoplastic chemotherapy: Secondary | ICD-10-CM | POA: Insufficient documentation

## 2022-02-25 DIAGNOSIS — R918 Other nonspecific abnormal finding of lung field: Secondary | ICD-10-CM

## 2022-02-25 DIAGNOSIS — Z7984 Long term (current) use of oral hypoglycemic drugs: Secondary | ICD-10-CM | POA: Diagnosis not present

## 2022-02-25 DIAGNOSIS — E119 Type 2 diabetes mellitus without complications: Secondary | ICD-10-CM | POA: Insufficient documentation

## 2022-02-25 DIAGNOSIS — Z803 Family history of malignant neoplasm of breast: Secondary | ICD-10-CM | POA: Diagnosis not present

## 2022-02-25 DIAGNOSIS — G9389 Other specified disorders of brain: Secondary | ICD-10-CM

## 2022-02-25 DIAGNOSIS — C801 Malignant (primary) neoplasm, unspecified: Secondary | ICD-10-CM

## 2022-02-25 DIAGNOSIS — L400 Psoriasis vulgaris: Secondary | ICD-10-CM | POA: Insufficient documentation

## 2022-02-25 DIAGNOSIS — C7931 Secondary malignant neoplasm of brain: Secondary | ICD-10-CM

## 2022-02-25 DIAGNOSIS — Z800 Family history of malignant neoplasm of digestive organs: Secondary | ICD-10-CM | POA: Insufficient documentation

## 2022-02-25 DIAGNOSIS — Z87891 Personal history of nicotine dependence: Secondary | ICD-10-CM | POA: Insufficient documentation

## 2022-02-25 DIAGNOSIS — R569 Unspecified convulsions: Secondary | ICD-10-CM | POA: Insufficient documentation

## 2022-02-25 DIAGNOSIS — C3492 Malignant neoplasm of unspecified part of left bronchus or lung: Secondary | ICD-10-CM

## 2022-02-25 DIAGNOSIS — F419 Anxiety disorder, unspecified: Secondary | ICD-10-CM | POA: Insufficient documentation

## 2022-02-25 DIAGNOSIS — R519 Headache, unspecified: Secondary | ICD-10-CM | POA: Diagnosis not present

## 2022-02-25 DIAGNOSIS — Z79899 Other long term (current) drug therapy: Secondary | ICD-10-CM | POA: Diagnosis not present

## 2022-02-25 DIAGNOSIS — Z7189 Other specified counseling: Secondary | ICD-10-CM | POA: Insufficient documentation

## 2022-02-25 DIAGNOSIS — Z7952 Long term (current) use of systemic steroids: Secondary | ICD-10-CM | POA: Diagnosis not present

## 2022-02-25 DIAGNOSIS — C3490 Malignant neoplasm of unspecified part of unspecified bronchus or lung: Secondary | ICD-10-CM | POA: Insufficient documentation

## 2022-02-25 LAB — CBC WITH DIFFERENTIAL (CANCER CENTER ONLY)
Abs Immature Granulocytes: 0.12 10*3/uL — ABNORMAL HIGH (ref 0.00–0.07)
Basophils Absolute: 0 10*3/uL (ref 0.0–0.1)
Basophils Relative: 0 %
Eosinophils Absolute: 0 10*3/uL (ref 0.0–0.5)
Eosinophils Relative: 0 %
HCT: 48.7 % — ABNORMAL HIGH (ref 36.0–46.0)
Hemoglobin: 16.4 g/dL — ABNORMAL HIGH (ref 12.0–15.0)
Immature Granulocytes: 1 %
Lymphocytes Relative: 7 %
Lymphs Abs: 1.3 10*3/uL (ref 0.7–4.0)
MCH: 28.8 pg (ref 26.0–34.0)
MCHC: 33.7 g/dL (ref 30.0–36.0)
MCV: 85.4 fL (ref 80.0–100.0)
Monocytes Absolute: 0.7 10*3/uL (ref 0.1–1.0)
Monocytes Relative: 4 %
Neutro Abs: 17.6 10*3/uL — ABNORMAL HIGH (ref 1.7–7.7)
Neutrophils Relative %: 88 %
Platelet Count: 253 10*3/uL (ref 150–400)
RBC: 5.7 MIL/uL — ABNORMAL HIGH (ref 3.87–5.11)
RDW: 14 % (ref 11.5–15.5)
WBC Count: 19.8 10*3/uL — ABNORMAL HIGH (ref 4.0–10.5)
nRBC: 0 % (ref 0.0–0.2)

## 2022-02-25 LAB — CMP (CANCER CENTER ONLY)
ALT: 68 U/L — ABNORMAL HIGH (ref 0–44)
AST: 24 U/L (ref 15–41)
Albumin: 3.9 g/dL (ref 3.5–5.0)
Alkaline Phosphatase: 55 U/L (ref 38–126)
Anion gap: 9 (ref 5–15)
BUN: 23 mg/dL (ref 8–23)
CO2: 28 mmol/L (ref 22–32)
Calcium: 10 mg/dL (ref 8.9–10.3)
Chloride: 101 mmol/L (ref 98–111)
Creatinine: 0.76 mg/dL (ref 0.44–1.00)
GFR, Estimated: >60 mL/min (ref >60)
Glucose, Bld: 186 mg/dL — ABNORMAL HIGH (ref 70–99)
Potassium: 4.1 mmol/L (ref 3.5–5.1)
Sodium: 138 mmol/L (ref 135–145)
Total Bilirubin: 0.5 mg/dL (ref 0.3–1.2)
Total Protein: 6.4 g/dL — ABNORMAL LOW (ref 6.5–8.1)

## 2022-02-25 MED ORDER — PROCHLORPERAZINE MALEATE 10 MG PO TABS: 10.0000 mg | ORAL_TABLET | Freq: Four times a day (QID) | ORAL | 2 refills | Status: DC | PRN

## 2022-02-25 MED ORDER — DEXAMETHASONE 4 MG PO TABS: 4.0000 mg | ORAL_TABLET | Freq: Two times a day (BID) | ORAL | 0 refills | Status: DC

## 2022-02-25 MED ORDER — LEVETIRACETAM 500 MG PO TABS: 500.0000 mg | ORAL_TABLET | Freq: Two times a day (BID) | ORAL | 0 refills | Status: DC

## 2022-02-25 NOTE — Patient Instructions (Addendum)
Summary:  -There are two main categories of lung cancer, they are named based on the size of the cancer cell. One is called Non-Small cell lung cancer. The other type is Small Cell Lung Cancer -The sample (biopsy) that they took of your tumor was consistent with a subtype of Non-small cell lung cancer called ____.  -We covered a lot of important information at your appointment today regarding what the treatment plan is moving forward. Here are the the main points that were discussed at your office visit with [place] today:  -The treatment that you will receive consists of two chemotherapy drugs, called Carboplatin and/or Alimta/Taxol (also called Pemetrexed/paclitaxel) and one immunotherapy drug called Keytruda (pembrolizumab).  -We are planning on starting your treatment next week on 03/06/22 but before your start your treatment, I would like you to attend a Chemotherapy Education Class. This involves having you sit down with one of our nurse educators. She will discuss with your one-on-one more details about your treatment as well as general information about resources here at the cancer center.  -Your treatment will be given once every 3 weeks. We will check your labs once a week for the first ~5 treatments just to make sure that important components of your blood are in an acceptable range -We will get a CT scan after 3 treatments to check on the progress of treatment  Medications:  -I have sent a few important medication prescriptions to your pharmacy.  -Compazine was sent to your pharmacy. This medication is for nausea. You may take this every 6 hours as needed if you feel nauseous.  -I have also sent a prescription for 1 mg of folic acid to your pharmacy. We need you to take 1 tablet every day.  -We will administer vitamin B12 every 9 weeks while you are here in the clinic. You have received your first dose today.  -I will refill the keppra and decadron until you can see Dr. Mickeal Skinner and radiation  oncology.   Referrals or Imaging: -Please keep the appointment for the PET scan next week on 03/06/22 -Referral to Dr. Mickeal Skinner, neuro-oncologist and radiation oncology    Follow up:  -We will see you back for a follow up visit 1 week after your first treatment to see how it went and help manage any side effects of treatment that you may have   -If you need to reach [place] at any time, the main office number to the cancer center is [phone], when you call, ask to speak to either Cassie's or Dr. Worthy Flank nurse.

## 2022-02-26 ENCOUNTER — Telehealth: Payer: Self-pay | Admitting: Family

## 2022-02-26 LAB — CYTOLOGY - NON PAP

## 2022-02-26 NOTE — Telephone Encounter (Signed)
A lady from [facility] called to check if STD paperwork for patient had been filled out. Can contact her at [phone].

## 2022-02-26 NOTE — Telephone Encounter (Signed)
Lmtcb [place]

## 2022-02-26 NOTE — Telephone Encounter (Signed)
Spoke with Jinny Blossom at [facility] she is aware

## 2022-02-26 NOTE — Telephone Encounter (Signed)
Has paper work been filled out?

## 2022-02-26 NOTE — Telephone Encounter (Signed)
Left detailed message on patient phone

## 2022-02-26 NOTE — Telephone Encounter (Signed)
Will be ready for pick up tomorrow after lunch.   Evelina Dun, FNP

## 2022-02-26 NOTE — Progress Notes (Signed)
Received a return phone call from Caryl Pina, the nurse for [employer] (pt[facility]) at [facility]. She let me know that the pts disability paperwork will be completed tomorrow and sent to [place]. I offered to call the pt, however Caryl Pina said she was planning on calling her today, anyway. I requested that she mentioned to the pt that her and I spoke. Caryl Pina was agreeable to this.

## 2022-02-27 ENCOUNTER — Other Ambulatory Visit: Payer: Self-pay | Admitting: Internal Medicine

## 2022-02-27 ENCOUNTER — Other Ambulatory Visit: Payer: Self-pay | Admitting: Physician Assistant

## 2022-02-27 ENCOUNTER — Inpatient Hospital Stay: Payer: BC Managed Care – PPO | Admitting: Licensed Clinical Social Worker

## 2022-02-27 DIAGNOSIS — C3492 Malignant neoplasm of unspecified part of left bronchus or lung: Secondary | ICD-10-CM

## 2022-02-27 DIAGNOSIS — R918 Other nonspecific abnormal finding of lung field: Secondary | ICD-10-CM

## 2022-02-27 MED ORDER — FOLIC ACID 1 MG PO TABS: 1.0000 mg | ORAL_TABLET | Freq: Every day | ORAL | 2 refills | Status: DC

## 2022-02-27 NOTE — Progress Notes (Signed)
I called the patient to review her final pathology.  I was unable to reach her.  The patient did give me permission a few days ago to leave a detailed voicemail.  She will need a B12 injection and I have sent a scheduling message to make her B12 injection before her chemo education appointment on 03/01/2022.  I have also sent folic acid to her pharmacy to start taking 1 tablet p.o. daily.  I will also send her a MyChart message later today with the summary.

## 2022-02-27 NOTE — Progress Notes (Signed)
DISCONTINUE SELECTED CLINICAL TRIAL - Non-Small Cell Lung  Trial: LUNGMAP: A Master Protocol To Evaluate Biomarker-Driven Therapies And Immunotherapies In Previously-Treated Non-Small Cell Lung Cancer (Lung-Map Screening Study)  **Trial eligibility and accrual should be confirmed by your research team**  REASON: Other Reason PRIOR TREATMENT: Trial: LUNGMAP: A Master Protocol To Evaluate Biomarker-Driven Therapies And Immunotherapies In Previously-Treated Non-Small Cell Lung Cancer (Lung-Map Screening Study)  START ON PATHWAY REGIMEN - Non-Small Cell Lung     A cycle is every 21 days:     Pembrolizumab      Pemetrexed      Carboplatin   **Always confirm dose/schedule in your pharmacy ordering system**  Patient Characteristics: Stage IV Metastatic, Nonsquamous, Molecular Analysis Completed, Molecular Alteration Present and Targeted Therapy Exhausted OR EGFR Exon 20+ or KRAS G12C+ or HER2+ Present and No Prior Chemo/Immunotherapy OR No Alteration Present, Initial  Chemotherapy/Immunotherapy, PS = 0, 1, No Alteration Present, No Alteration Present, Candidate for Immunotherapy, PD-L1 Expression Positive 1-49% (TPS) / Negative / Not Tested / Awaiting Test Results and Immunotherapy Candidate Therapeutic Status: Stage IV Metastatic Histology: Nonsquamous Cell Broad Molecular Profiling Status: Molecular Analysis Completed Molecular Analysis Results: No Alteration Present ECOG Performance Status: 1 Chemotherapy/Immunotherapy Line of Therapy: Initial Chemotherapy/Immunotherapy EGFR Exons 18-21 Mutation Testing Status: Completed and Negative ALK Fusion/Rearrangement Testing Status: Completed and Negative BRAF V600 Mutation Testing Status: Completed and Negative KRAS G12C Mutation Testing Status: Completed and Negative MET Exon 14 Mutation Testing Status: Completed and Negative RET Fusion/Rearrangement Testing Status: Completed and Negative HER2 Mutation Testing Status: Completed and  Negative NTRK Fusion/Rearrangement Testing Status: Completed and Negative ROS1 Fusion/Rearrangement Testing Status: Completed and Negative Immunotherapy Candidate Status: Candidate for Immunotherapy PD-L1 Expression Status: Awaiting Test Results Intent of Therapy: Non-Curative / Palliative Intent, Discussed with Patient

## 2022-02-27 NOTE — Telephone Encounter (Signed)
Pt called in, aware forms faxed in, copy left at front desk for her

## 2022-02-27 NOTE — Progress Notes (Signed)
[place] [facility]  Initial Assessment   Carolyn Hooper is a 61 y.o. year old female contacted by phone. Clinical Social Work was referred by medical provider for assessment of psychosocial needs.   SDOH (Social Determinants of Health) assessments performed: Yes SDOH Interventions    Flowsheet Row Office Visit from 10/24/2021 in [place] Visit from 09/21/2019 in [place] Interventions    Depression Interventions/Treatment  Medication Medication       SDOH Screenings   Food Insecurity: No Food Insecurity (02/13/2022)  Housing: Low Risk  (02/13/2022)  Transportation Needs: No Transportation Needs (02/13/2022)  Utilities: Not At Risk (02/13/2022)  Depression (PHQ2-9): Low Risk  (10/29/2021)  Tobacco Use: Medium Risk (02/24/2022)     Distress Screen completed: No     No data to display            Family/Social Information:  Housing Arrangement: patient lives with an 63 y/o house mate from [place].   Pt experienced a seizure which led to her hospital admission and subsequent cancer diagnosis.  Pt and her house mate have discussed her condition and what he should look for to prompt either a 911 call or a call to pt's family should she experience seizure like symptoms again. Family members/support persons in your life? Pt's son resides 2 houses away and pt's sister resides 2 houses away on the other side.  Pt's mother resides in [place] and all three are assisting pt as they are able.  Pt's mother does not see well enough to drive after dark, but is able to assist pt w/ transportation before dark. Transportation concerns: Pt's only transportation concern is if she will still be at the cancer center after dark.  On Friday 12/15 pt has chemo education scheduled which is anticipated to take 2 hours.  Pt's mother will be accompanying her, but is unable to drive after dark and pt is unable to drive due to seizures.  CSW  contacted transportation to request we accommodate transportation need for this appointmen, awaiting response. Employment: Working full time for [employer].  Pt is presently working with HR to initiate FMLA benefits.  Income source: Employment Financial concerns: No Type of concern: None Food access concerns: no Religious or spiritual practice: Not known Services Currently in place:  none  Coping/ Adjustment to diagnosis: Patient understands treatment plan and what happens next? yes Concerns about diagnosis and/or treatment: How will I care for myself and Quality of life Patient reported stressors: Depression, Adjusting to my illness, and Facing my mortality Hopes and/or priorities: Pt's priority is to start treatment w/ the hope of positive results.  Pt's speech has been affected and she is presently trying to get her Critical Illness Insurance benefit and paperwork in order to ensure a smooth transition for her family to manage her affairs should her disease progress. Patient enjoys time with family/ friends Current coping skills/ strengths: Ability for insight , Capable of independent living , Scientist, research (life sciences) , General fund of knowledge , Motivation for treatment/growth , and Supportive family/friends     SUMMARY: Current SDOH Barriers:  Pt is concerned about her ability to communicate should her speech be further impacted.  Clinical Social Work Clinical Goal(s):  Patient will work with SW to address concerns related to securing Critical Illness benefits  Interventions: Discussed common feeling and emotions when being diagnosed with cancer, and the importance of support during treatment Informed patient of the support team roles  and support services at [facility] Provided CSW contact information and encouraged patient to call with any questions or concerns Referred patient to transportation to accommodate appointment which will end after dark.  CSW printed paperwork necessary to be  submitted for Critical Illness benefits and will give pt this paperwork to pt at her appointment 12/15 .   Follow Up Plan: Patient will contact CSW with any support or resource needs Patient verbalizes understanding of plan: Yes    Henriette Combs, LCSW

## 2022-02-27 NOTE — Progress Notes (Addendum)
CLINICAL TRIAL SELECTED - Non-Small Cell Lung  Trial: LUNGMAP: A Master Protocol To Evaluate Biomarker-Driven Therapies And Immunotherapies In Previously-Treated Non-Small Cell Lung Cancer (Lung-Map Screening Study)  **Trial eligibility and accrual should be confirmed by your research team**  Patient Characteristics: Stage IV Metastatic, Nonsquamous, Molecular Analysis Completed, Molecular Alteration Present and Targeted Therapy Exhausted OR EGFR Exon 20+ or KRAS G12C+ or HER2+ Present and No Prior Chemo/Immunotherapy OR No Alteration Present, Initial  Chemotherapy/Immunotherapy, PS = 0, 1, No Alteration Present, No Alteration Present, Candidate for Immunotherapy, PD-L1 Expression Positive 1-49% (TPS) / Negative / Not Tested / Awaiting Test Results and Immunotherapy Candidate Therapeutic Status: Stage IV Metastatic Histology: Nonsquamous Cell Broad Molecular Profiling Status: Molecular Analysis Completed Molecular Analysis Results: No Alteration Present ECOG Performance Status: 1 Chemotherapy/Immunotherapy Line of Therapy: Initial Chemotherapy/Immunotherapy EGFR Exons 18-21 Mutation Testing Status: Completed and Negative ALK Fusion/Rearrangement Testing Status: Completed and Negative BRAF V600 Mutation Testing Status: Completed and Negative KRAS G12C Mutation Testing Status: Completed and Negative MET Exon 14 Mutation Testing Status: Completed and Negative RET Fusion/Rearrangement Testing Status: Completed and Negative HER2 Mutation Testing Status: Completed and Negative NTRK Fusion/Rearrangement Testing Status: Completed and Negative ROS1 Fusion/Rearrangement Testing Status: Completed and Negative Immunotherapy Candidate Status: Candidate for Immunotherapy PD-L1 Expression Status: Awaiting Test Results Intent of Therapy: Non-Curative / Palliative Intent, Discussed with Patient

## 2022-02-28 ENCOUNTER — Other Ambulatory Visit: Payer: Self-pay

## 2022-02-28 ENCOUNTER — Telehealth: Payer: Self-pay

## 2022-02-28 NOTE — Progress Notes (Signed)
Patient has already seen Dr. Julien Nordmann at the cancer center.  And reviewed pathology results.  She is already set up for treatments.  Thanks,  BLI  Garner Nash, DO [facility] 02/28/2022 4:43 PM

## 2022-02-28 NOTE — Telephone Encounter (Signed)
This nurse spoke with patient today and made her aware of her injection appointment on 12/15 for B12.  This patient also asked about her appointment schedule.  Informed her about her scheduled appointments coming up and advised that we can give her an updated print out on 12/15 when she comes for her patient education.  No further questions or concerns

## 2022-03-01 ENCOUNTER — Inpatient Hospital Stay: Payer: BC Managed Care – PPO | Admitting: Licensed Clinical Social Worker

## 2022-03-01 ENCOUNTER — Other Ambulatory Visit: Payer: Self-pay

## 2022-03-01 ENCOUNTER — Inpatient Hospital Stay: Payer: BC Managed Care – PPO

## 2022-03-01 DIAGNOSIS — Z7952 Long term (current) use of systemic steroids: Secondary | ICD-10-CM | POA: Diagnosis not present

## 2022-03-01 DIAGNOSIS — R569 Unspecified convulsions: Secondary | ICD-10-CM | POA: Diagnosis not present

## 2022-03-01 DIAGNOSIS — Z800 Family history of malignant neoplasm of digestive organs: Secondary | ICD-10-CM | POA: Diagnosis not present

## 2022-03-01 DIAGNOSIS — Z7984 Long term (current) use of oral hypoglycemic drugs: Secondary | ICD-10-CM | POA: Diagnosis not present

## 2022-03-01 DIAGNOSIS — C3492 Malignant neoplasm of unspecified part of left bronchus or lung: Secondary | ICD-10-CM

## 2022-03-01 DIAGNOSIS — Z803 Family history of malignant neoplasm of breast: Secondary | ICD-10-CM | POA: Diagnosis not present

## 2022-03-01 DIAGNOSIS — Z79899 Other long term (current) drug therapy: Secondary | ICD-10-CM | POA: Diagnosis not present

## 2022-03-01 DIAGNOSIS — E119 Type 2 diabetes mellitus without complications: Secondary | ICD-10-CM | POA: Diagnosis not present

## 2022-03-01 DIAGNOSIS — R519 Headache, unspecified: Secondary | ICD-10-CM | POA: Diagnosis not present

## 2022-03-01 DIAGNOSIS — C3432 Malignant neoplasm of lower lobe, left bronchus or lung: Secondary | ICD-10-CM | POA: Diagnosis not present

## 2022-03-01 DIAGNOSIS — Z5111 Encounter for antineoplastic chemotherapy: Secondary | ICD-10-CM | POA: Diagnosis not present

## 2022-03-01 DIAGNOSIS — C7931 Secondary malignant neoplasm of brain: Secondary | ICD-10-CM | POA: Diagnosis not present

## 2022-03-01 DIAGNOSIS — L400 Psoriasis vulgaris: Secondary | ICD-10-CM | POA: Diagnosis not present

## 2022-03-01 DIAGNOSIS — Z87891 Personal history of nicotine dependence: Secondary | ICD-10-CM | POA: Diagnosis not present

## 2022-03-01 DIAGNOSIS — F419 Anxiety disorder, unspecified: Secondary | ICD-10-CM | POA: Diagnosis not present

## 2022-03-01 DIAGNOSIS — Z5112 Encounter for antineoplastic immunotherapy: Secondary | ICD-10-CM | POA: Diagnosis not present

## 2022-03-01 MED ORDER — CYANOCOBALAMIN 1000 MCG/ML IJ SOLN
1000.0000 ug | Freq: Once | INTRAMUSCULAR | Status: AC
  Administered 2022-03-01: 1000 ug via INTRAMUSCULAR
  Filled 2022-03-01: qty 1

## 2022-03-01 NOTE — Progress Notes (Signed)
[place] CSW Progress Note  Holiday representative met with patient to provide a hard copy of the [employer] Critical Illness application.  Pt reports she has spoken w/ Christella Scheuermann and the claim has been filed.  CSW assured pt if any documentation is needed from the hospital we will provide requested documentation to support pt's application.  Pt to f/u w/ CSW for counseling once treatment begins.  CSW to remain available throughout duration of treatment to provide support as appropriate.      Henriette Combs, LCSW

## 2022-03-03 ENCOUNTER — Other Ambulatory Visit: Payer: Self-pay

## 2022-03-04 ENCOUNTER — Ambulatory Visit: Payer: BC Managed Care – PPO

## 2022-03-04 NOTE — Progress Notes (Signed)
Radiation Oncology         [phone]) [phone] ________________________________  Name: Carolyn Hooper        MRN: [id number]  Date of Service: 03/07/2022 DOB: 10-14-1960  MA:UQJFH, Theador Hawthorne, FNP  Curt Bears, MD     REFERRING PHYSICIAN: Curt Bears, MD   DIAGNOSIS: The encounter diagnosis was Non-small cell lung cancer, unspecified laterality ([place]).   HISTORY OF PRESENT ILLNESS: Carolyn Hooper is a 61 y.o. female seen at the request of Dr. Julien Nordmann for a new diagnosis of lung cancer.  The patient  initially presented on 02/11/2022 to the emergency room with code stroke.  Apparently she was unable to verbalize that she needed help and had symptoms of aphasia and had seizure like activity.  Imaging when she arrived with CT to the emergency room showed a large area of vasogenic edema in the left frontal/parietal lobe and a second smaller lesion in the superior right frontal lobe.  A CT chest abdomen pelvis with contrast showed a 5.4 cm mass in the left lower lobe concerning for malignancy and additional bilateral pulmonary nodules measuring up to 9 mm.  No evidence of metastatic changes were seen elsewhere.  An MRI of the brain with and without contrast on 02/13/2022 measured the left posterior frontal mass at 2.2 cm with regional edema and in the right posterior frontal lobe with minimal edema a 6 x 8 mm mass.  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  She underwent bronchoscopy on 02/19/2022, brushings and fine-needle aspirate of the left lower lobe were consistent with non-small cell lung cancer with a nonspecific immunoprofile but differential included adenosquamous carcinoma.  Her PET scan from yesterday showed***.  She receive her first dose of carboplatin, Alimta, and Keytruda yesterday as well.  Of note she has remained on dexamethasone and her current dose is 4 mg twice daily.    PREVIOUS RADIATION THERAPY: {EXAM; YES/NO:19492::"No"}   PAST  MEDICAL HISTORY:  Past Medical History:  Diagnosis Date   Abscess    hydroadenitis superativa   Anxiety    Diabetes mellitus without complication ([place])    Hyperinsulinemia    Hypertension 1983   gestational - pre-eclampsia   Neuroma    audiotry neuroma   Seizure ([place])    Vertigo        PAST SURGICAL HISTORY: Past Surgical History:  Procedure Laterality Date   ABDOMINAL HYSTERECTOMY  2006   remaining left ovary   BRONCHIAL BIOPSY  02/19/2022   Procedure: BRONCHIAL BIOPSIES;  Surgeon: Garner Nash, DO;  Location: [place];  Service: Pulmonary;;   BRONCHIAL BRUSHINGS  02/19/2022   Procedure: BRONCHIAL BRUSHINGS;  Surgeon: Garner Nash, DO;  Location: [place];  Service: Pulmonary;;   BRONCHIAL NEEDLE ASPIRATION BIOPSY  02/19/2022   Procedure: BRONCHIAL NEEDLE ASPIRATION BIOPSIES;  Surgeon: Garner Nash, DO;  Location: [facility];  Service: Pulmonary;;   SHOULDER SURGERY Right 1960s     FAMILY HISTORY:  Family History  Problem Relation Age of Onset   COPD Mother    Heart disease Father    Diabetes Father    Kidney disease Father    Arthritis Father        RA   Liver disease Father      SOCIAL HISTORY:  reports that she quit smoking about 3 weeks ago. Her smoking use included cigarettes. She smoked an average of .75 packs per day. She has never used smokeless tobacco. She  reports current alcohol use. She reports that she does not use drugs.  The patient is divorced and lives in [place].   ALLERGIES: Adhesive [tape] and Statins   MEDICATIONS:  Current Outpatient Medications  Medication Sig Dispense Refill   albuterol (VENTOLIN HFA) 108 (90 Base) MCG/ACT inhaler Inhale 2 puffs into the lungs every 6 (six) hours as needed. 18 g 2   Aspirin-Acetaminophen-Caffeine (GOODYS EXTRA STRENGTH) 500-325-65 MG PACK Take 1 packet by mouth daily as needed (headaches).     blood glucose meter kit and supplies Dispense based on patient and insurance preference.  Use up to four times daily as directed. (FOR ICD-10 E10.9, E11.9). 1 each 0   clonazePAM (KLONOPIN) 0.5 MG tablet Take 0.5 tablets (0.25 mg total) by mouth at bedtime. For anxiety (Patient taking differently: Take 0.25 mg by mouth daily as needed for anxiety (and vertigo).) 30 tablet 2   dexamethasone (DECADRON) 4 MG tablet Take 1 tablet (4 mg total) by mouth 2 (two) times daily with a meal. 28 tablet 0   folic acid (FOLVITE) 1 MG tablet Take 1 tablet (1 mg total) by mouth daily. 30 tablet 2   levETIRAcetam (KEPPRA) 500 MG tablet Take 1 tablet (500 mg total) by mouth 2 (two) times daily. 60 tablet 0   metFORMIN (GLUCOPHAGE) 1000 MG tablet Take 1 tablet (1,000 mg total) by mouth 2 (two) times daily. (Patient taking differently: Take 500-1,000 mg by mouth See admin instructions. 500 mg or 1000 mg with meals depending on how much carbs and sugars Pt eats) 180 tablet 1   metFORMIN (GLUCOPHAGE) 850 MG tablet Take 425-850 mg by mouth See admin instructions. 425 mg - 850 mg with meals, depending on how much carbs and sugars Pt eats     ondansetron (ZOFRAN) 4 MG tablet Take 1 tablet (4 mg total) by mouth every 6 (six) hours as needed for nausea. (Patient not taking: Reported on 02/25/2022) 20 tablet 0   prochlorperazine (COMPAZINE) 10 MG tablet Take 1 tablet (10 mg total) by mouth every 6 (six) hours as needed. 30 tablet 2   No current facility-administered medications for this visit.     REVIEW OF SYSTEMS: On review of systems, the patient reports that ***      PHYSICAL EXAM:  Wt Readings from Last 3 Encounters:  02/25/22 235 lb 3.2 oz (106.7 kg)  02/19/22 238 lb (108 kg)  02/12/22 250 lb (113.4 kg)   Temp Readings from Last 3 Encounters:  02/25/22 97.7 F (36.5 C) (Oral)  02/19/22 97.9 F (36.6 C)  02/13/22 97.9 F (36.6 C) (Oral)   BP Readings from Last 3 Encounters:  02/25/22 (!) 146/54  02/19/22 128/64  02/13/22 134/70   Pulse Readings from Last 3 Encounters:  02/25/22 (!) 55   02/19/22 (!) 55  02/13/22 64    /10  In general this is a well appearing Caucasian female in no acute distress.  She's alert and oriented x4 and appropriate throughout the examination. Cardiopulmonary assessment is negative for acute distress and she exhibits normal effort.     ECOG = ***  0 - Asymptomatic (Fully active, able to carry on all predisease activities without restriction)  1 - Symptomatic but completely ambulatory (Restricted in physically strenuous activity but ambulatory and able to carry out work of a light or sedentary nature. For example, light housework, office work)  2 - Symptomatic, <50% in bed during the day (Ambulatory and capable of all self care but unable to carry out  any work activities. Up and about more than 50% of waking hours)  3 - Symptomatic, >50% in bed, but not bedbound (Capable of only limited self-care, confined to bed or chair 50% or more of waking hours)  4 - Bedbound (Completely disabled. Cannot carry on any self-care. Totally confined to bed or chair)  5 - Death   Eustace Pen MM, Creech RH, Tormey DC, et al. [phone]). "Toxicity and response criteria of the [facility]". [place] Oncol. 5 (6): 649-55    LABORATORY DATA:  Lab Results  Component Value Date   WBC 19.8 (H) 02/25/2022   HGB 16.4 (H) 02/25/2022   HCT 48.7 (H) 02/25/2022   MCV 85.4 02/25/2022   PLT 253 02/25/2022   Lab Results  Component Value Date   NA 138 02/25/2022   K 4.1 02/25/2022   CL 101 02/25/2022   CO2 28 02/25/2022   Lab Results  Component Value Date   ALT 68 (H) 02/25/2022   AST 24 02/25/2022   ALKPHOS 55 02/25/2022   BILITOT 0.5 02/25/2022      RADIOGRAPHY: DG Chest Port 1 View  Result Date: 02/19/2022 CLINICAL DATA:  Congestive heart failure EXAM: PORTABLE CHEST 1 VIEW COMPARISON:  Chest radiograph, 10/23/2004 CT chest abdomen pelvis, 02/12/2022 FINDINGS: The heart size and mediastinal contours are within normal limits. Medial left  lower lobe mass and multiple small bilateral pulmonary nodules better appreciated by prior CT. No acute appearing airspace opacity. The visualized skeletal structures are unremarkable. IMPRESSION: 1. Medial left lower lobe mass and multiple small bilateral pulmonary nodules better appreciated by prior CT. 2.  No acute appearing airspace opacity. Electronically Signed   By: Delanna Ahmadi M.D.   On: 02/19/2022 10:07   DG C-ARM BRONCHOSCOPY  Result Date: 02/19/2022 C-ARM BRONCHOSCOPY: Fluoroscopy was utilized by the requesting physician.  No radiographic interpretation.   MR BRAIN W WO CONTRAST  Result Date: 02/13/2022 CLINICAL DATA:  Assess for intracranial metastatic disease. History of right vestibular schwannoma. Recent abnormal neuro imaging with edema in the left brain. EXAM: MRI HEAD WITHOUT AND WITH CONTRAST TECHNIQUE: Multiplanar, multiecho pulse sequences of the brain and surrounding structures were obtained without and with intravenous contrast. CONTRAST:  56m GADAVIST GADOBUTROL 1 MMOL/ML IV SOLN COMPARISON:  CT yesterday.  MRI 01/26/2021 FINDINGS: Brain: There is a lobular up to 2.2 cm in diameter mass at the left posterior frontal vertex with regional edema. There is a second 6 x 8 mm mass in the medial right posterior frontal vertex with minimal edema. These are most likely to be metastatic lesions. No evidence of ischemic change within the brain. Approximately 5 mm vestibular schwannoma remains visible in the right IAC. No hydrocephalus. No extra-axial collection. No evidence of hemorrhage or shift. Vascular: Major vessels at the base of the brain show flow. Skull and upper cervical spine: Negative Sinuses/Orbits: Clear/normal Other: None IMPRESSION: 1. 2.2 cm mass at the left posterior frontal vertex with regional edema. 6 x 8 mm mass at the medial right posterior frontal vertex with minimal edema. These are most likely metastatic lesions. 2. 5 mm vestibular schwannoma remains visible in the  right IAC. Electronically Signed   By: MNelson ChimesM.D.   On: 02/13/2022 15:19   CT ANGIO HEAD NECK W WO CM  Result Date: 02/12/2022 CLINICAL DATA:  Stroke follow-up EXAM: CT ANGIOGRAPHY HEAD AND NECK TECHNIQUE: Multidetector CT imaging of the head and neck was performed using the standard protocol during bolus administration of intravenous  contrast. Multiplanar CT image reconstructions and MIPs were obtained to evaluate the vascular anatomy. Carotid stenosis measurements (when applicable) are obtained utilizing NASCET criteria, using the distal internal carotid diameter as the denominator. RADIATION DOSE REDUCTION: This exam was performed according to the departmental dose-optimization program which includes automated exposure control, adjustment of the mA and/or kV according to patient size and/or use of iterative reconstruction technique. CONTRAST:  178m OMNIPAQUE IOHEXOL 350 MG/ML SOLN COMPARISON:  Head CT 02/12/2019 FINDINGS: CTA NECK FINDINGS SKELETON: There is no bony spinal canal stenosis. No lytic or blastic lesion. OTHER NECK: Normal pharynx, larynx and major salivary glands. No cervical lymphadenopathy. Unremarkable thyroid gland. UPPER CHEST: 8 mm nodules in the right upper and middle lobes AORTIC ARCH: There is calcific atherosclerosis of the aortic arch. There is no aneurysm, dissection or hemodynamically significant stenosis of the visualized portion of the aorta. Conventional 3 vessel aortic branching pattern. The visualized proximal subclavian arteries are widely patent. RIGHT CAROTID SYSTEM: Normal without aneurysm, dissection or stenosis. LEFT CAROTID SYSTEM: Normal without aneurysm, dissection or stenosis. VERTEBRAL ARTERIES: Left dominant configuration. Both origins are clearly patent. There is no dissection, occlusion or flow-limiting stenosis to the skull base (V1-V3 segments). CTA HEAD FINDINGS Unchanged appearance of area of vasogenic edema in the left hemisphere. POSTERIOR  CIRCULATION: --Vertebral arteries: Normal V4 segments. --Inferior cerebellar arteries: Normal. --Basilar artery: Normal. --Superior cerebellar arteries: Normal. --Posterior cerebral arteries (PCA): Normal. ANTERIOR CIRCULATION: --Intracranial internal carotid arteries: Normal. --Anterior cerebral arteries (ACA): Normal. Both A1 segments are present. Patent anterior communicating artery (a-comm). --Middle cerebral arteries (MCA): Normal. VENOUS SINUSES: As permitted by contrast timing, patent. ANATOMIC VARIANTS: None Review of the MIP images confirms the above findings. IMPRESSION: 1. No emergent large vessel occlusion or high-grade stenosis of the intracranial arteries. 2. Unchanged appearance of area of vasogenic edema in the left hemisphere. 3. A 8 mm nodules in the right upper and middle lobes, likely metastases. This is more completely characterized on concomitant CT chest, abdomen and pelvis. Aortic atherosclerosis (ICD10-I70.0). Electronically Signed   By: KUlyses JarredM.D.   On: 02/12/2022 01:39   CT CHEST ABDOMEN PELVIS W CONTRAST  Result Date: 02/12/2022 CLINICAL DATA:  Brain tumor seen on CT query metastatic disease EXAM: CT CHEST, ABDOMEN, AND PELVIS WITH CONTRAST TECHNIQUE: Multidetector CT imaging of the chest, abdomen and pelvis was performed following the standard protocol during bolus administration of intravenous contrast. RADIATION DOSE REDUCTION: This exam was performed according to the departmental dose-optimization program which includes automated exposure control, adjustment of the mA and/or kV according to patient size and/or use of iterative reconstruction technique. CONTRAST:  1058mOMNIPAQUE IOHEXOL 350 MG/ML SOLN COMPARISON:  None Available. FINDINGS: CT CHEST FINDINGS Cardiovascular: Coronary artery calcifications. Aortic arch calcifications. Normal heart size. No pericardial effusion. Mediastinum/Nodes: No enlarged mediastinal, hilar, or axillary lymph nodes. Thyroid gland,  trachea, and esophagus demonstrate no significant findings. Lungs/Pleura: Numerous bilateral small pulmonary nodules with the largest measuring 9 mm in the lateral right upper lobe (4/41). Irregular mass along the left lower lobe medially measures 5.4 x 4.1 by 5.4 cm. This obstructs multiple segmental right lower lobe bronchi. No pleural effusion or pneumothorax. Musculoskeletal: No chest wall mass or suspicious bone lesions identified. CT ABDOMEN PELVIS FINDINGS Hepatobiliary: Large 4.4 cm gallstone in the gallbladder neck. No evidence of cholecystitis. No biliary dilation. No suspicious hepatic lesion. Pancreas: Unremarkable. Spleen: Unremarkable. Adrenals/Urinary Tract: Normal adrenal glands. No suspicious renal mass. No hydronephrosis or urinary calculi. Unremarkable bladder. Stomach/Bowel: Normal caliber large  and small bowel. Colonic diverticulosis without diverticulitis. Normal appendix. Unremarkable stomach. Vascular/Lymphatic: Aortic athero sclerotic calcification. There are few prominent subcentimeter upper retroperitoneal nodes (for example precaval node measuring 8 mm in short axis (3/61). Reproductive: Hysterectomy.  No suspicious adnexal mass. Other: No free intraperitoneal fluid or air. Musculoskeletal: No acute osseous abnormality or destructive osseous lesion. Thoracolumbar spondylosis. Sclerosis in the left femoral head has a benign appearance. IMPRESSION: 1. 5.4 cm mass in the medial left lower lobe concerning for malignancy. 2. Additional bilateral pulmonary nodules measuring up to 9 mm suspicious for metastases. 3. No evidence of malignancy in the abdomen or pelvis. 4. 4.4 cm gallstone in the gallbladder neck. 5.  Aortic Atherosclerosis (ICD10-I70.0). Electronically Signed   By: Placido Sou M.D.   On: 02/12/2022 01:31   CT HEAD CODE STROKE WO CONTRAST  Result Date: 02/11/2022 CLINICAL DATA:  Code stroke. EXAM: CT HEAD WITHOUT CONTRAST TECHNIQUE: Contiguous axial images were obtained  from the base of the skull through the vertex without intravenous contrast. RADIATION DOSE REDUCTION: This exam was performed according to the departmental dose-optimization program which includes automated exposure control, adjustment of the mA and/or kV according to patient size and/or use of iterative reconstruction technique. COMPARISON:  Brain MRI 01/25/2021 FINDINGS: Brain: There is a large area of vasogenic edema within the left frontal and parietal lobes. On the right, there is a punctate hyperdense focus with adjacent vasogenic edema at the superior paramedian right frontal lobe (3:30). No midline shift. Vascular: No abnormal hyperdensity of the major intracranial arteries or dural venous sinuses. No intracranial atherosclerosis. Skull: The visualized skull base, calvarium and extracranial soft tissues are normal. Sinuses/Orbits: No fluid levels or advanced mucosal thickening of the visualized paranasal sinuses. No mastoid or middle ear effusion. The orbits are normal. IMPRESSION: Large area of vasogenic edema in the left frontal/parietal lobe with overlying subarachnoid blood. Second, smaller lesion in the superior right frontal lobe. To gather appearance is concerning for metastatic disease. MRI of the brain with and without contrast is suggested. Critical Value/emergent results were called by telephone at the time of interpretation on 02/11/2022 at 10:53 pm to provider [name] TRIFAN , who verbally acknowledged these results. Electronically Signed   By: Ulyses Jarred M.D.   On: 02/11/2022 22:53       IMPRESSION/PLAN: 1. 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 Dr. Lisbeth Renshaw discusses the pathology findings and reviews the nature of ***  We discussed the risks, benefits, short, and long term effects of radiotherapy, as well as the curative intent, and the patient is interested in proceeding. Dr. Lisbeth Renshaw discusses the delivery and logistics of radiotherapy and  anticipates a course of ***  Written consent is obtained and placed in the chart, a copy was provided to the patient. She will proceed with MRI brain with 3T SRS protocol on ***. She will also simulate on ***.    In a visit lasting *** minutes, greater than 50% of the time was spent face to face discussing the patient's condition, in preparation for the discussion, and coordinating the patient's care.   The above documentation reflects my direct findings during this shared patient visit. Please see the separate note by Dr. Lisbeth Renshaw on this date for the remainder of the patient's plan of care.    Carola Rhine, [facility]   **Disclaimer: This note was dictated with voice recognition software. Similar sounding words can inadvertently be transcribed and this note may contain transcription errors which may  not have been corrected upon publication of note.**

## 2022-03-05 ENCOUNTER — Other Ambulatory Visit: Payer: Self-pay

## 2022-03-05 ENCOUNTER — Encounter: Payer: Self-pay | Admitting: Internal Medicine

## 2022-03-05 DIAGNOSIS — C7931 Secondary malignant neoplasm of brain: Secondary | ICD-10-CM

## 2022-03-05 MED FILL — Fosaprepitant Dimeglumine For IV Infusion 150 MG (Base Eq): INTRAVENOUS | Qty: 5 | Status: AC

## 2022-03-05 MED FILL — Dexamethasone Sodium Phosphate Inj 100 MG/10ML: INTRAMUSCULAR | Qty: 1 | Status: AC

## 2022-03-05 NOTE — Progress Notes (Signed)
Location/Histology of Brain Tumor: Lung Cancer with mets to brain  Patient presented to the emergency room 02/11/2022 with stroke like symptoms.  She was noted to have some aphasia and had seizure like activity.  PET 03/06/2022: Tracer avid left lower lobe lung mass is identified compatible with primary bronchogenic carcinoma.  Bilateral tracer avid pulmonary nodules compatible with metastatic disease.  No signs of tracer avid nodal metastasis or distant metastatic disease.  Bronchoscopy 02/19/2022:   MRI Brain 02/13/2022: left posterior frontal mass at 2.2 cm with regional edema and in the right posterior frontal lobe with minimal edema a 6 x 8 mm mass.  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   CT Head 02/11/2022:  large area of vasogenic edema in the left frontal/parietal lobe and a second smaller lesion in the superior right frontal lobe.  CT AP 02/11/2022: 5.4 cm mass in the left lower lobe concerning for malignancy and additional bilateral pulmonary nodules measuring up to 9 mm.  No evidence of metastatic changes were seen elsewhere.           Past or anticipated interventions, if any, per neurosurgery:    Past or  anticipated interventions, if any, per medical oncology:  [facility] / Dr. Julien Nordmann 02/25/2022 - Dr. Julien Nordmann gave the patient the option of a referral to palliative care/hospice versus starting on systemic treatment with therapy and immunotherapy.  The patient is interested in starting systemic treatment.  -We will wait for the results of her final pathology before determining her systemic treatment which will either be carboplatin for an AUC of 5, Alimta 500 mg/m, and Keytruda 200 mg IV every 3 weeks versus carboplatin for an AUC of 5, paclitaxel 175 mg/m, Keytruda 200 mg IV every 3 weeks with Neulasta support.  -The patient is interested in this option and she is expected to undergo her first cycle of treatment next week on  03/06/2022.    Dose of Decadron, if applicable: 4 mg BID   Recent neurologic symptoms, if any:  Seizures: No Headaches: No Nausea: No Dizziness/ataxia: No Difficulty with hand coordination: She reports baseline left hand stiffness due to cubital tunnel syndrome. Focal numbness/weakness: No Visual deficits/changes: No Confusion/Memory deficits: No Speech: She reports her speech was affected by the seizures she had.   Signs/Symptoms Weight changes, if any: No Respiratory complaints, if any: Denies SOB Hemoptysis, if any: Occasional dry cough.  Denies hemoptysis. Pain issues, if any: No   SAFETY ISSUES: Prior radiation? No Pacemaker/ICD? No Possible current pregnancy? Hysterectomy Is the patient on methotrexate? No  Additional Complaints / other details:

## 2022-03-05 NOTE — Progress Notes (Signed)
Called pt to introduce myself as her Arboriculturist and to discuss the [employer].  Pt would like to apply and since her short term nor long term disability has started yet she's currently not receiving income so she will provide a letter of support on 03/27/22.  Once received I will approve her for the grant.

## 2022-03-06 ENCOUNTER — Other Ambulatory Visit: Payer: Self-pay

## 2022-03-06 ENCOUNTER — Ambulatory Visit (HOSPITAL_COMMUNITY)
Admission: RE | Admit: 2022-03-06 | Discharge: 2022-03-06 | Disposition: A | Discharge status: Home / Self Care | Payer: BC Managed Care – PPO | Source: Ambulatory Visit | Attending: Pulmonary Disease | Admitting: Pulmonary Disease

## 2022-03-06 ENCOUNTER — Inpatient Hospital Stay: Payer: BC Managed Care – PPO

## 2022-03-06 ENCOUNTER — Other Ambulatory Visit: Payer: Self-pay | Admitting: Medical Oncology

## 2022-03-06 VITALS — BP 136/60 | HR 61 | Temp 97.9°F | Resp 18 | Wt 237.1 lb

## 2022-03-06 DIAGNOSIS — C7931 Secondary malignant neoplasm of brain: Secondary | ICD-10-CM | POA: Diagnosis not present

## 2022-03-06 DIAGNOSIS — Z5112 Encounter for antineoplastic immunotherapy: Secondary | ICD-10-CM | POA: Diagnosis not present

## 2022-03-06 DIAGNOSIS — F419 Anxiety disorder, unspecified: Secondary | ICD-10-CM | POA: Diagnosis not present

## 2022-03-06 DIAGNOSIS — E119 Type 2 diabetes mellitus without complications: Secondary | ICD-10-CM | POA: Diagnosis not present

## 2022-03-06 DIAGNOSIS — C3490 Malignant neoplasm of unspecified part of unspecified bronchus or lung: Secondary | ICD-10-CM | POA: Diagnosis not present

## 2022-03-06 DIAGNOSIS — C3492 Malignant neoplasm of unspecified part of left bronchus or lung: Secondary | ICD-10-CM

## 2022-03-06 DIAGNOSIS — R569 Unspecified convulsions: Secondary | ICD-10-CM | POA: Diagnosis not present

## 2022-03-06 DIAGNOSIS — C3432 Malignant neoplasm of lower lobe, left bronchus or lung: Secondary | ICD-10-CM | POA: Diagnosis not present

## 2022-03-06 DIAGNOSIS — R519 Headache, unspecified: Secondary | ICD-10-CM | POA: Diagnosis not present

## 2022-03-06 DIAGNOSIS — Z803 Family history of malignant neoplasm of breast: Secondary | ICD-10-CM | POA: Diagnosis not present

## 2022-03-06 DIAGNOSIS — Z87891 Personal history of nicotine dependence: Secondary | ICD-10-CM | POA: Diagnosis not present

## 2022-03-06 DIAGNOSIS — I878 Other specified disorders of veins: Secondary | ICD-10-CM

## 2022-03-06 DIAGNOSIS — Z800 Family history of malignant neoplasm of digestive organs: Secondary | ICD-10-CM | POA: Diagnosis not present

## 2022-03-06 DIAGNOSIS — R918 Other nonspecific abnormal finding of lung field: Secondary | ICD-10-CM | POA: Diagnosis not present

## 2022-03-06 DIAGNOSIS — Z79899 Other long term (current) drug therapy: Secondary | ICD-10-CM | POA: Diagnosis not present

## 2022-03-06 DIAGNOSIS — Z7952 Long term (current) use of systemic steroids: Secondary | ICD-10-CM | POA: Diagnosis not present

## 2022-03-06 DIAGNOSIS — L400 Psoriasis vulgaris: Secondary | ICD-10-CM | POA: Diagnosis not present

## 2022-03-06 DIAGNOSIS — Z5111 Encounter for antineoplastic chemotherapy: Secondary | ICD-10-CM | POA: Diagnosis not present

## 2022-03-06 DIAGNOSIS — Z7984 Long term (current) use of oral hypoglycemic drugs: Secondary | ICD-10-CM | POA: Diagnosis not present

## 2022-03-06 LAB — CMP (CANCER CENTER ONLY)
ALT: 49 U/L — ABNORMAL HIGH (ref 0–44)
AST: 15 U/L (ref 15–41)
Albumin: 3.7 g/dL (ref 3.5–5.0)
Alkaline Phosphatase: 47 U/L (ref 38–126)
Anion gap: 6 (ref 5–15)
BUN: 30 mg/dL — ABNORMAL HIGH (ref 8–23)
CO2: 29 mmol/L (ref 22–32)
Calcium: 9.5 mg/dL (ref 8.9–10.3)
Chloride: 103 mmol/L (ref 98–111)
Creatinine: 0.76 mg/dL (ref 0.44–1.00)
GFR, Estimated: >60 mL/min (ref >60)
Glucose, Bld: 211 mg/dL — ABNORMAL HIGH (ref 70–99)
Potassium: 4.3 mmol/L (ref 3.5–5.1)
Sodium: 138 mmol/L (ref 135–145)
Total Bilirubin: 0.7 mg/dL (ref 0.3–1.2)
Total Protein: 6.3 g/dL — ABNORMAL LOW (ref 6.5–8.1)

## 2022-03-06 LAB — CBC WITH DIFFERENTIAL (CANCER CENTER ONLY)
Abs Immature Granulocytes: 0.1 10*3/uL — ABNORMAL HIGH (ref 0.00–0.07)
Basophils Absolute: 0 10*3/uL (ref 0.0–0.1)
Basophils Relative: 0 %
Eosinophils Absolute: 0 10*3/uL (ref 0.0–0.5)
Eosinophils Relative: 0 %
HCT: 48 % — ABNORMAL HIGH (ref 36.0–46.0)
Hemoglobin: 16.8 g/dL — ABNORMAL HIGH (ref 12.0–15.0)
Immature Granulocytes: 1 %
Lymphocytes Relative: 10 %
Lymphs Abs: 1.6 10*3/uL (ref 0.7–4.0)
MCH: 29.9 pg (ref 26.0–34.0)
MCHC: 35 g/dL (ref 30.0–36.0)
MCV: 85.6 fL (ref 80.0–100.0)
Monocytes Absolute: 0.6 10*3/uL (ref 0.1–1.0)
Monocytes Relative: 4 %
Neutro Abs: 13.3 10*3/uL — ABNORMAL HIGH (ref 1.7–7.7)
Neutrophils Relative %: 85 %
Platelet Count: 195 10*3/uL (ref 150–400)
RBC: 5.61 MIL/uL — ABNORMAL HIGH (ref 3.87–5.11)
RDW: 14.4 % (ref 11.5–15.5)
WBC Count: 15.6 10*3/uL — ABNORMAL HIGH (ref 4.0–10.5)
nRBC: 0 % (ref 0.0–0.2)

## 2022-03-06 LAB — TSH: TSH: 1.152 u[IU]/mL (ref 0.350–4.500)

## 2022-03-06 LAB — GLUCOSE, CAPILLARY: Glucose-Capillary: 158 mg/dL — ABNORMAL HIGH (ref 70–99)

## 2022-03-06 MED ORDER — SODIUM CHLORIDE 0.9 % IV SOLN
10.0000 mg | Freq: Once | INTRAVENOUS | Status: AC
  Administered 2022-03-06: 10 mg via INTRAVENOUS
  Filled 2022-03-06: qty 10

## 2022-03-06 MED ORDER — SODIUM CHLORIDE 0.9 % IV SOLN: Freq: Once | INTRAVENOUS | Status: AC

## 2022-03-06 MED ORDER — PALONOSETRON HCL INJECTION 0.25 MG/5ML
0.2500 mg | Freq: Once | INTRAVENOUS | Status: AC
  Administered 2022-03-06: 0.25 mg via INTRAVENOUS
  Filled 2022-03-06: qty 5

## 2022-03-06 MED ORDER — FLUDEOXYGLUCOSE F - 18 (FDG) INJECTION
11.6700 | Freq: Once | INTRAVENOUS | Status: AC | PRN
  Administered 2022-03-06: 11.67 via INTRAVENOUS

## 2022-03-06 MED ORDER — SODIUM CHLORIDE 0.9 % IV SOLN
200.0000 mg | Freq: Once | INTRAVENOUS | Status: AC
  Administered 2022-03-06: 200 mg via INTRAVENOUS
  Filled 2022-03-06: qty 200

## 2022-03-06 MED ORDER — SODIUM CHLORIDE 0.9 % IV SOLN
150.0000 mg | Freq: Once | INTRAVENOUS | Status: AC
  Administered 2022-03-06: 150 mg via INTRAVENOUS
  Filled 2022-03-06: qty 150

## 2022-03-06 MED ORDER — SODIUM CHLORIDE 0.9 % IV SOLN
739.0000 mg | Freq: Once | INTRAVENOUS | Status: AC
  Administered 2022-03-06: 740 mg via INTRAVENOUS
  Filled 2022-03-06: qty 74

## 2022-03-06 MED ORDER — SODIUM CHLORIDE 0.9 % IV SOLN
500.0000 mg/m2 | Freq: Once | INTRAVENOUS | Status: AC
  Administered 2022-03-06: 1100 mg via INTRAVENOUS
  Filled 2022-03-06: qty 40

## 2022-03-06 NOTE — Patient Instructions (Signed)
[facility]  Discharge Instructions: Thank you for choosing [facility] to provide your oncology and hematology care.   If you have a lab appointment with the [facility], please go directly to the [facility] and check in at the registration area.   Wear comfortable clothing and clothing appropriate for easy access to any Portacath or PICC line.   We strive to give you quality time with your provider. You may need to reschedule your appointment if you arrive late (15 or more minutes).  Arriving late affects you and other patients whose appointments are after yours.  Also, if you miss three or more appointments without notifying the office, you may be dismissed from the clinic at the provider's discretion.      For prescription refill requests, have your pharmacy contact our office and allow 72 hours for refills to be completed.    Today you received the following chemotherapy and/or immunotherapy agents : Keytruda, Alimta, Carboplatin      To help prevent nausea and vomiting after your treatment, we encourage you to take your nausea medication as directed.  BELOW ARE SYMPTOMS THAT SHOULD BE REPORTED IMMEDIATELY: *FEVER GREATER THAN 100.4 F (38 C) OR HIGHER *CHILLS OR SWEATING *NAUSEA AND VOMITING THAT IS NOT CONTROLLED WITH YOUR NAUSEA MEDICATION *UNUSUAL SHORTNESS OF BREATH *UNUSUAL BRUISING OR BLEEDING *URINARY PROBLEMS (pain or burning when urinating, or frequent urination) *BOWEL PROBLEMS (unusual diarrhea, constipation, pain near the anus) TENDERNESS IN MOUTH AND THROAT WITH OR WITHOUT PRESENCE OF ULCERS (sore throat, sores in mouth, or a toothache) UNUSUAL RASH, SWELLING OR PAIN  UNUSUAL VAGINAL DISCHARGE OR ITCHING   Items with * indicate a potential emergency and should be followed up as soon as possible or go to the Emergency Department if any problems should occur.  Please show the CHEMOTHERAPY ALERT CARD or IMMUNOTHERAPY ALERT  CARD at check-in to the Emergency Department and triage nurse.  Should you have questions after your visit or need to cancel or reschedule your appointment, please contact [facility]  Dept: [phone]  and follow the prompts.  Office hours are 8:00 a.m. to 4:30 p.m. Monday - Friday. Please note that voicemails left after 4:00 p.m. may not be returned until the following business day.  We are closed weekends and major holidays. You have access to a nurse at all times for urgent questions. Please call the main number to the clinic Dept: [phone] and follow the prompts.   For any non-urgent questions, you may also contact your provider using MyChart. We now offer e-Visits for anyone 1 and older to request care online for non-urgent symptoms. For details visit [email or web address].   Also download the MyChart app! Go to the app store, search "MyChart", open the app, select [facility], and log in with your MyChart username and password.  Masks are optional in the cancer centers. If you would like for your care team to wear a mask while they are taking care of you, please let them know. You may have one support person who is at least 62 years old accompany you for your appointments. Pembrolizumab Injection What is this medication? PEMBROLIZUMAB (PEM broe LIZ ue mab) treats some types of cancer. It works by helping your immune system slow or stop the spread of cancer cells. It is a monoclonal antibody. This medicine may be used for other purposes; ask your health care provider or pharmacist if you have questions. COMMON BRAND NAME(S):  Keytruda What should I tell my care team before I take this medication? They need to know if you have any of these conditions: Allogeneic stem cell transplant (uses someone else's stem cells) Autoimmune diseases, such as Crohn disease, ulcerative colitis, lupus History of chest radiation Nervous system problems, such as  Guillain-Barre syndrome, myasthenia gravis Organ transplant An unusual or allergic reaction to pembrolizumab, other medications, foods, dyes, or preservatives Pregnant or trying to get pregnant Breast-feeding How should I use this medication? This medication is injected into a vein. It is given by your care team in a hospital or clinic setting. A special MedGuide will be given to you before each treatment. Be sure to read this information carefully each time. Talk to your care team about the use of this medication in children. While it may be prescribed for children as young as 6 months for selected conditions, precautions do apply. Overdosage: If you think you have taken too much of this medicine contact a poison control center or emergency room at once. NOTE: This medicine is only for you. Do not share this medicine with others. What if I miss a dose? Keep appointments for follow-up doses. It is important not to miss your dose. Call your care team if you are unable to keep an appointment. What may interact with this medication? Interactions have not been studied. This list may not describe all possible interactions. Give your health care provider a list of all the medicines, herbs, non-prescription drugs, or dietary supplements you use. Also tell them if you smoke, drink alcohol, or use illegal drugs. Some items may interact with your medicine. What should I watch for while using this medication? Your condition will be monitored carefully while you are receiving this medication. You may need blood work while taking this medication. This medication may cause serious skin reactions. They can happen weeks to months after starting the medication. Contact your care team right away if you notice fevers or flu-like symptoms with a rash. The rash may be red or purple and then turn into blisters or peeling of the skin. You may also notice a red rash with swelling of the face, lips, or lymph nodes in your  neck or under your arms. Tell your care team right away if you have any change in your eyesight. Talk to your care team if you may be pregnant. Serious birth defects can occur if you take this medication during pregnancy and for 4 months after the last dose. You will need a negative pregnancy test before starting this medication. Contraception is recommended while taking this medication and for 4 months after the last dose. Your care team can help you find the option that works for you. Do not breastfeed while taking this medication and for 4 months after the last dose. What side effects may I notice from receiving this medication? Side effects that you should report to your care team as soon as possible: Allergic reactions--skin rash, itching, hives, swelling of the face, lips, tongue, or throat Dry cough, shortness of breath or trouble breathing Eye pain, redness, irritation, or discharge with blurry or decreased vision Heart muscle inflammation--unusual weakness or fatigue, shortness of breath, chest pain, fast or irregular heartbeat, dizziness, swelling of the ankles, feet, or hands Hormone gland problems--headache, sensitivity to light, unusual weakness or fatigue, dizziness, fast or irregular heartbeat, increased sensitivity to cold or heat, excessive sweating, constipation, hair loss, increased thirst or amount of urine, tremors or shaking, irritability Infusion reactions--chest pain, shortness  of breath or trouble breathing, feeling faint or lightheaded Kidney injury (glomerulonephritis)--decrease in the amount of urine, red or dark brown urine, foamy or bubbly urine, swelling of the ankles, hands, or feet Liver injury--right upper belly pain, loss of appetite, nausea, light-colored stool, dark yellow or brown urine, yellowing skin or eyes, unusual weakness or fatigue Pain, tingling, or numbness in the hands or feet, muscle weakness, change in vision, confusion or trouble speaking, loss of  balance or coordination, trouble walking, seizures Rash, fever, and swollen lymph nodes Redness, blistering, peeling, or loosening of the skin, including inside the mouth Sudden or severe stomach pain, bloody diarrhea, fever, nausea, vomiting Side effects that usually do not require medical attention (report to your care team if they continue or are bothersome): Bone, joint, or muscle pain Diarrhea Fatigue Loss of appetite Nausea Skin rash This list may not describe all possible side effects. Call your doctor for medical advice about side effects. You may report side effects to FDA at 1-800-FDA-1088. Where should I keep my medication? This medication is given in a hospital or clinic. It will not be stored at home. NOTE: This sheet is a summary. It may not cover all possible information. If you have questions about this medicine, talk to your doctor, pharmacist, or health care provider.  2023 Elsevier/Gold Standard (2012-11-23 00:00:00) Pemetrexed Injection What is this medication? PEMETREXED (PEM e TREX ed) treats some types of cancer. It works by slowing down the growth of cancer cells. This medicine may be used for other purposes; ask your health care provider or pharmacist if you have questions. COMMON BRAND NAME(S): Alimta, PEMFEXY What should I tell my care team before I take this medication? They need to know if you have any of these conditions: Infection, such as chickenpox, cold sores, or herpes Kidney disease Low blood cell levels (white cells, red cells, and platelets) Lung or breathing disease, such as asthma Radiation therapy An unusual or allergic reaction to pemetrexed, other medications, foods, dyes, or preservatives If you or your partner are pregnant or trying to get pregnant Breast-feeding How should I use this medication? This medication is injected into a vein. It is given by your care team in a hospital or clinic setting. Talk to your care team about the use of  this medication in children. Special care may be needed. Overdosage: If you think you have taken too much of this medicine contact a poison control center or emergency room at once. NOTE: This medicine is only for you. Do not share this medicine with others. What if I miss a dose? Keep appointments for follow-up doses. It is important not to miss your dose. Call your care team if you are unable to keep an appointment. What may interact with this medication? Do not take this medication with any of the following: Live virus vaccines This medication may also interact with the following: Ibuprofen This list may not describe all possible interactions. Give your health care provider a list of all the medicines, herbs, non-prescription drugs, or dietary supplements you use. Also tell them if you smoke, drink alcohol, or use illegal drugs. Some items may interact with your medicine. What should I watch for while using this medication? Your condition will be monitored carefully while you are receiving this medication. This medication may make you feel generally unwell. This is not uncommon as chemotherapy can affect healthy cells as well as cancer cells. Report any side effects. Continue your course of treatment even though you feel  ill unless your care team tells you to stop. This medication can cause serious side effects. To reduce the risk, your care team may give you other medications to take before receiving this one. Be sure to follow the directions from your care team. This medication can cause a rash or redness in areas of the body that have previously had radiation therapy. If you have had radiation therapy, tell your care team if you notice a rash in this area. This medication may increase your risk of getting an infection. Call your care team for advice if you get a fever, chills, sore throat, or other symptoms of a cold or flu. Do not treat yourself. Try to avoid being around people who are  sick. Be careful brushing or flossing your teeth or using a toothpick because you may get an infection or bleed more easily. If you have any dental work done, tell your dentist you are receiving this medication. Avoid taking medications that contain aspirin, acetaminophen, ibuprofen, naproxen, or ketoprofen unless instructed by your care team. These medications may hide a fever. Check with your care team if you have severe diarrhea, nausea, and vomiting, or if you sweat a lot. The loss of too much body fluid may make it dangerous for you to take this medication. Talk to your care team if you or your partner wish to become pregnant or think either of you might be pregnant. This medication can cause serious birth defects if taken during pregnancy and for 6 months after the last dose. A negative pregnancy test is required before starting this medication. A reliable form of contraception is recommended while taking this medication and for 6 months after the last dose. Talk to your care team about reliable forms of contraception. Do not father a child while taking this medication and for 3 months after the last dose. Use a condom while having sex during this time period. Do not breastfeed while taking this medication and for 1 week after the last dose. This medication may cause infertility. Talk to your care team if you are concerned about your fertility. What side effects may I notice from receiving this medication? Side effects that you should report to your care team as soon as possible: Allergic reactions--skin rash, itching, hives, swelling of the face, lips, tongue, or throat Dry cough, shortness of breath or trouble breathing Infection--fever, chills, cough, sore throat, wounds that don't heal, pain or trouble when passing urine, general feeling of discomfort or being unwell Kidney injury--decrease in the amount of urine, swelling of the ankles, hands, or feet Low red blood cell level--unusual  weakness or fatigue, dizziness, headache, trouble breathing Redness, blistering, peeling, or loosening of the skin, including inside the mouth Unusual bruising or bleeding Side effects that usually do not require medical attention (report to your care team if they continue or are bothersome): Fatigue Loss of appetite Nausea Vomiting This list may not describe all possible side effects. Call your doctor for medical advice about side effects. You may report side effects to FDA at 1-800-FDA-1088. Where should I keep my medication? This medication is given in a hospital or clinic. It will not be stored at home. NOTE: This sheet is a summary. It may not cover all possible information. If you have questions about this medicine, talk to your doctor, pharmacist, or health care provider.  2023 Elsevier/Gold Standard (2021-07-09 00:00:00) Carboplatin Injection What is this medication? CARBOPLATIN (KAR boe pla tin) treats some types of cancer. It works by  slowing down the growth of cancer cells. This medicine may be used for other purposes; ask your health care provider or pharmacist if you have questions. COMMON BRAND NAME(S): Paraplatin What should I tell my care team before I take this medication? They need to know if you have any of these conditions: Blood disorders Hearing problems Kidney disease Recent or ongoing radiation therapy An unusual or allergic reaction to carboplatin, cisplatin, other medications, foods, dyes, or preservatives Pregnant or trying to get pregnant Breast-feeding How should I use this medication? This medication is injected into a vein. It is given by your care team in a hospital or clinic setting. Talk to your care team about the use of this medication in children. Special care may be needed. Overdosage: If you think you have taken too much of this medicine contact a poison control center or emergency room at once. NOTE: This medicine is only for you. Do not share  this medicine with others. What if I miss a dose? Keep appointments for follow-up doses. It is important not to miss your dose. Call your care team if you are unable to keep an appointment. What may interact with this medication? Medications for seizures Some antibiotics, such as amikacin, gentamicin, neomycin, streptomycin, tobramycin Vaccines This list may not describe all possible interactions. Give your health care provider a list of all the medicines, herbs, non-prescription drugs, or dietary supplements you use. Also tell them if you smoke, drink alcohol, or use illegal drugs. Some items may interact with your medicine. What should I watch for while using this medication? Your condition will be monitored carefully while you are receiving this medication. You may need blood work while taking this medication. This medication may make you feel generally unwell. This is not uncommon, as chemotherapy can affect healthy cells as well as cancer cells. Report any side effects. Continue your course of treatment even though you feel ill unless your care team tells you to stop. In some cases, you may be given additional medications to help with side effects. Follow all directions for their use. This medication may increase your risk of getting an infection. Call your care team for advice if you get a fever, chills, sore throat, or other symptoms of a cold or flu. Do not treat yourself. Try to avoid being around people who are sick. Avoid taking medications that contain aspirin, acetaminophen, ibuprofen, naproxen, or ketoprofen unless instructed by your care team. These medications may hide a fever. Be careful brushing or flossing your teeth or using a toothpick because you may get an infection or bleed more easily. If you have any dental work done, tell your dentist you are receiving this medication. Talk to your care team if you wish to become pregnant or think you might be pregnant. This medication can  cause serious birth defects. Talk to your care team about effective forms of contraception. Do not breast-feed while taking this medication. What side effects may I notice from receiving this medication? Side effects that you should report to your care team as soon as possible: Allergic reactions--skin rash, itching, hives, swelling of the face, lips, tongue, or throat Infection--fever, chills, cough, sore throat, wounds that don't heal, pain or trouble when passing urine, general feeling of discomfort or being unwell Low red blood cell level--unusual weakness or fatigue, dizziness, headache, trouble breathing Pain, tingling, or numbness in the hands or feet, muscle weakness, change in vision, confusion or trouble speaking, loss of balance or coordination, trouble walking, seizures  Unusual bruising or bleeding Side effects that usually do not require medical attention (report to your care team if they continue or are bothersome): Hair loss Nausea Unusual weakness or fatigue Vomiting This list may not describe all possible side effects. Call your doctor for medical advice about side effects. You may report side effects to FDA at 1-800-FDA-1088. Where should I keep my medication? This medication is given in a hospital or clinic. It will not be stored at home. NOTE: This sheet is a summary. It may not cover all possible information. If you have questions about this medicine, talk to your doctor, pharmacist, or health care provider.  2023 Elsevier/Gold Standard (2021-06-18 00:00:00)

## 2022-03-07 ENCOUNTER — Inpatient Hospital Stay (HOSPITAL_BASED_OUTPATIENT_CLINIC_OR_DEPARTMENT_OTHER): Payer: BC Managed Care – PPO | Admitting: Internal Medicine

## 2022-03-07 ENCOUNTER — Ambulatory Visit
Admission: RE | Admit: 2022-03-07 | Discharge: 2022-03-07 | Disposition: A | Discharge status: Home / Self Care | Payer: BC Managed Care – PPO | Source: Ambulatory Visit | Attending: Radiation Oncology | Admitting: Radiation Oncology

## 2022-03-07 ENCOUNTER — Encounter: Payer: Self-pay | Admitting: Radiation Oncology

## 2022-03-07 VITALS — BP 151/75 | HR 63 | Temp 98.0°F | Resp 18 | Ht 68.0 in | Wt 241.1 lb

## 2022-03-07 DIAGNOSIS — C7931 Secondary malignant neoplasm of brain: Secondary | ICD-10-CM | POA: Insufficient documentation

## 2022-03-07 DIAGNOSIS — Z803 Family history of malignant neoplasm of breast: Secondary | ICD-10-CM | POA: Diagnosis not present

## 2022-03-07 DIAGNOSIS — D333 Benign neoplasm of cranial nerves: Secondary | ICD-10-CM | POA: Diagnosis not present

## 2022-03-07 DIAGNOSIS — Z7984 Long term (current) use of oral hypoglycemic drugs: Secondary | ICD-10-CM | POA: Diagnosis not present

## 2022-03-07 DIAGNOSIS — G40909 Epilepsy, unspecified, not intractable, without status epilepticus: Secondary | ICD-10-CM | POA: Insufficient documentation

## 2022-03-07 DIAGNOSIS — Z7952 Long term (current) use of systemic steroids: Secondary | ICD-10-CM | POA: Insufficient documentation

## 2022-03-07 DIAGNOSIS — E119 Type 2 diabetes mellitus without complications: Secondary | ICD-10-CM | POA: Diagnosis not present

## 2022-03-07 DIAGNOSIS — C3492 Malignant neoplasm of unspecified part of left bronchus or lung: Secondary | ICD-10-CM

## 2022-03-07 DIAGNOSIS — Z800 Family history of malignant neoplasm of digestive organs: Secondary | ICD-10-CM | POA: Diagnosis not present

## 2022-03-07 DIAGNOSIS — L400 Psoriasis vulgaris: Secondary | ICD-10-CM | POA: Diagnosis not present

## 2022-03-07 DIAGNOSIS — R519 Headache, unspecified: Secondary | ICD-10-CM | POA: Diagnosis not present

## 2022-03-07 DIAGNOSIS — I700 Atherosclerosis of aorta: Secondary | ICD-10-CM | POA: Diagnosis not present

## 2022-03-07 DIAGNOSIS — R569 Unspecified convulsions: Secondary | ICD-10-CM

## 2022-03-07 DIAGNOSIS — Z5111 Encounter for antineoplastic chemotherapy: Secondary | ICD-10-CM | POA: Diagnosis not present

## 2022-03-07 DIAGNOSIS — I10 Essential (primary) hypertension: Secondary | ICD-10-CM | POA: Diagnosis not present

## 2022-03-07 DIAGNOSIS — C3432 Malignant neoplasm of lower lobe, left bronchus or lung: Secondary | ICD-10-CM | POA: Diagnosis not present

## 2022-03-07 DIAGNOSIS — Z79899 Other long term (current) drug therapy: Secondary | ICD-10-CM | POA: Diagnosis not present

## 2022-03-07 DIAGNOSIS — C3431 Malignant neoplasm of lower lobe, right bronchus or lung: Secondary | ICD-10-CM | POA: Diagnosis not present

## 2022-03-07 DIAGNOSIS — C3490 Malignant neoplasm of unspecified part of unspecified bronchus or lung: Secondary | ICD-10-CM | POA: Diagnosis not present

## 2022-03-07 DIAGNOSIS — Z87891 Personal history of nicotine dependence: Secondary | ICD-10-CM | POA: Diagnosis not present

## 2022-03-07 DIAGNOSIS — R609 Edema, unspecified: Secondary | ICD-10-CM | POA: Insufficient documentation

## 2022-03-07 DIAGNOSIS — F419 Anxiety disorder, unspecified: Secondary | ICD-10-CM | POA: Diagnosis not present

## 2022-03-07 DIAGNOSIS — Z5112 Encounter for antineoplastic immunotherapy: Secondary | ICD-10-CM | POA: Diagnosis not present

## 2022-03-07 LAB — T4: T4, Total: 6.7 ug/dL (ref 4.5–12.0)

## 2022-03-07 MED ORDER — DEXAMETHASONE 1 MG PO TABS: ORAL_TABLET | ORAL | 0 refills | Status: DC

## 2022-03-07 NOTE — Progress Notes (Signed)
[facility] [place], [place] [address] [phone]   New Patient Evaluation  Date of Service: 03/07/22 Patient Name: Carolyn Hooper Patient MRN: [id number] Patient DOB: December 12, 1960 Provider: Ventura Sellers, MD  Identifying Statement:  Carolyn Hooper is a 61 y.o. 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    Referring Provider: Heilingoetter, Tobe Sos, PA-C [address] [place],  [place] [address]  Primary Cancer:  Oncologic History: Oncology History  Non-small cell lung cancer ([employer])  02/25/2022 Initial Diagnosis   Non-small cell lung cancer ([place])   03/06/2022 -  Chemotherapy   Patient is on Treatment Plan : LUNG Carboplatin (5) + Pemetrexed (500) + Pembrolizumab (200) D1 q21d Induction x 4 cycles / Maintenance Pemetrexed (500) + Pembrolizumab (200) D1 q21d       History of Present Illness: The patient's records from the referring physician were obtained and reviewed and the patient interviewed to confirm this HPI.  Carolyn Hooper presented to neurologic attention on 02/11/22 with episode of sudden inability to speak. This resolved after some hours, she returned to prior baseline.  CNS imaging demonstrated lesions in both frontal lobes c/w metastatic disease, given concurrent lung mass.    Medications: Current Outpatient Medications on File Prior to Visit  Medication Sig Dispense Refill   albuterol (VENTOLIN HFA) 108 (90 Base) MCG/ACT inhaler Inhale 2 puffs into the lungs every 6 (six) hours as needed. 18 g 2   Aspirin-Acetaminophen-Caffeine (GOODYS EXTRA STRENGTH) 500-325-65 MG PACK Take 1 packet by mouth daily as needed (headaches).     blood glucose meter kit and supplies Dispense based on patient and insurance preference. Use up to four times daily as directed. (FOR ICD-10 E10.9, E11.9). 1  each 0   clonazePAM (KLONOPIN) 0.5 MG tablet Take 0.5 tablets (0.25 mg total) by mouth at bedtime. For anxiety (Patient taking differently: Take 0.25 mg by mouth daily as needed for anxiety (and vertigo).) 30 tablet 2   dexamethasone (DECADRON) 4 MG tablet Take 1 tablet (4 mg total) by mouth 2 (two) times daily with a meal. 28 tablet 0   folic acid (FOLVITE) 1 MG tablet Take 1 tablet (1 mg total) by mouth daily. 30 tablet 2   levETIRAcetam (KEPPRA) 500 MG tablet Take 1 tablet (500 mg total) by mouth 2 (two) times daily. 60 tablet 0   metFORMIN (GLUCOPHAGE) 1000 MG tablet Take 1 tablet (1,000 mg total) by mouth 2 (two) times daily. (Patient taking differently: Take 500-1,000 mg by mouth See admin instructions. 500 mg or 1000 mg with meals depending on how much carbs and sugars Pt eats) 180 tablet 1   metFORMIN (GLUCOPHAGE) 850 MG tablet Take 425-850 mg by mouth See admin instructions. 425 mg - 850 mg with meals, depending on how much carbs and sugars Pt eats     ondansetron (ZOFRAN) 4 MG tablet Take 1 tablet (4 mg total) by mouth every 6 (six) hours as needed for nausea. 20 tablet 0   prochlorperazine (COMPAZINE) 10 MG tablet Take 1 tablet (10 mg total) by mouth every 6 (six) hours as needed. 30 tablet 2   No current facility-administered medications on file prior to visit.    Allergies:  Allergies  Allergen Reactions   Adhesive [Tape] Dermatitis   Statins Other (See Comments)    Myalgia, itching   Past Medical History:  Past Medical History:  Diagnosis Date   Abscess    hydroadenitis superativa   Anxiety    Diabetes mellitus without complication ([place])    Hyperinsulinemia    Hypertension 1983   gestational - pre-eclampsia   Neuroma    audiotry neuroma   Seizure [facility])    Vertigo    Past Surgical History:  Past Surgical History:  Procedure Laterality Date   ABDOMINAL HYSTERECTOMY  2006   remaining left ovary   BRONCHIAL BIOPSY  02/19/2022   Procedure: BRONCHIAL BIOPSIES;   Surgeon: Garner Nash, DO;  Location: [facility];  Service: Pulmonary;;   BRONCHIAL BRUSHINGS  02/19/2022   Procedure: BRONCHIAL BRUSHINGS;  Surgeon: Garner Nash, DO;  Location: [facility];  Service: Pulmonary;;   BRONCHIAL NEEDLE ASPIRATION BIOPSY  02/19/2022   Procedure: BRONCHIAL NEEDLE ASPIRATION BIOPSIES;  Surgeon: Garner Nash, DO;  Location: [facility];  Service: Pulmonary;;   SHOULDER SURGERY Right 1960s   Social History:  Social History   Socioeconomic History   Marital status: Divorced    Spouse name: Not on file   Number of children: Not on file   Years of education: Not on file   Highest education level: Not on file  Occupational History   Not on file  Tobacco Use   Smoking status: Former    Packs/day: 0.75    Types: Cigarettes    Quit date: 02/11/2022    Years since quitting: 0.0   Smokeless tobacco: Never  Vaping Use   Vaping Use: Never used  Substance and Sexual Activity   Alcohol use: Yes    Comment: very rare   Drug use: No   Sexual activity: Not on file  Other Topics Concern   Not on file  Social History Narrative   Not on file   Social Determinants of Health   Financial Resource Strain: Not on file  Food Insecurity: No Food Insecurity (02/13/2022)   Hunger Vital Sign    Worried About Running Out of Food in the Last Year: Never true    Ran Out of Food in the Last Year: Never true  Transportation Needs: No Transportation Needs (02/13/2022)   PRAPARE - Hydrologist (Medical): No    Lack of Transportation (Non-Medical): No  Physical Activity: Not on file  Stress: Not on file  Social Connections: Not on file  Intimate Partner Violence: Not At Risk (02/13/2022)   Humiliation, Afraid, Rape, and Kick questionnaire    Fear of Current or Ex-Partner: No    Emotionally Abused: No    Physically Abused: No    Sexually Abused: No   Family History:  Family History  Problem Relation Age of Onset   COPD Mother     Heart disease Father    Diabetes Father    Kidney disease Father    Arthritis Father        RA   Liver disease Father     Review of Systems: Constitutional: Doesn't report fevers, chills or abnormal weight loss Eyes: Doesn't report blurriness of vision Ears, nose, mouth, throat, and face: Doesn't report sore throat Respiratory: Doesn't report cough, dyspnea or wheezes Cardiovascular: Doesn't report palpitation, chest discomfort  Gastrointestinal:  Doesn't report nausea, constipation, diarrhea GU: Doesn't report incontinence Skin: Doesn't report skin rashes Neurological: Per HPI Musculoskeletal: Doesn't report joint pain Behavioral/Psych: Doesn't report anxiety  Physical Exam: There were no vitals filed for this visit. KPS: 80. General: Alert, cooperative, pleasant, in no acute distress  Head: Normal EENT: No conjunctival injection or scleral icterus.  Lungs: Resp effort normal Cardiac: Regular rate Abdomen: Non-distended abdomen Skin: No rashes cyanosis or petechiae. Extremities: No clubbing or edema  Neurologic Exam: Mental Status: Awake, alert, attentive to examiner. Oriented to self and environment. Language is fluent with intact comprehension.  Cranial Nerves: Visual acuity is grossly normal. Visual fields are full. Extra-ocular movements intact. No ptosis. Face is symmetric Motor: Tone and bulk are normal. Power is full in both arms and legs. Reflexes are symmetric, no pathologic reflexes present.  Sensory: Intact to light touch Gait: Normal.   Labs: I have reviewed the data as listed    Component Value Date/Time   NA 138 03/06/2022 1340   NA 141 11/01/2021 1009   K 4.3 03/06/2022 1340   CL 103 03/06/2022 1340   CO2 29 03/06/2022 1340   GLUCOSE 211 (H) 03/06/2022 1340   BUN 30 (H) 03/06/2022 1340   BUN 13 11/01/2021 1009   CREATININE 0.76 03/06/2022 1340   CALCIUM 9.5 03/06/2022 1340   PROT 6.3 (L) 03/06/2022 1340   PROT 6.2 11/01/2021 1009   ALBUMIN  3.7 03/06/2022 1340   ALBUMIN 3.9 11/01/2021 1009   AST 15 03/06/2022 1340   ALT 49 (H) 03/06/2022 1340   ALKPHOS 47 03/06/2022 1340   BILITOT 0.7 03/06/2022 1340   GFRNONAA >60 03/06/2022 1340   GFRAA 93 09/21/2019 0807   Lab Results  Component Value Date   WBC 15.6 (H) 03/06/2022   NEUTROABS 13.3 (H) 03/06/2022   HGB 16.8 (H) 03/06/2022   HCT 48.0 (H) 03/06/2022   MCV 85.6 03/06/2022   PLT 195 03/06/2022    Imaging:  NM PET Image Initial (PI) Skull Base To Thigh (F-18 FDG)  Result Date: 03/07/2022 CLINICAL DATA:  Initial treatment strategy for non-small cell lung cancer. EXAM: NUCLEAR MEDICINE PET SKULL BASE TO THIGH TECHNIQUE: 11.67 mCi F-18 FDG was injected intravenously. Full-ring PET imaging was performed from the skull base to thigh after the radiotracer. CT data was obtained and used for attenuation correction and anatomic localization. Fasting blood glucose: 158 mg/dl COMPARISON:  CT chest, abdomen and pelvis 02/12/2022 FINDINGS: Mediastinal blood pool activity: SUV max 3.12 Liver activity: SUV max NA NECK: No hypermetabolic lymph nodes in the neck. Incidental CT findings: None. CHEST: Tracer avid subpleural mass within the posteromedial left lower lobe measures 5.2 x 4.4 cm with SUV max of 8.92, image 48/7. Multiple bilateral pulmonary nodules are identified concerning for metastatic disease. Index lesions include: -right upper lobe lung nodule measures 0.9 cm with SUV max of 2.38, image 25/7. -left lower lobe lung nodule measures 0.7 cm with SUV max of 7.92, image 36/7. No tracer avid axillary, mediastinal or hilar lymph nodes. Incidental CT findings: Aortic atherosclerosis and coronary artery calcifications. ABDOMEN/PELVIS: No abnormal hypermetabolic activity within the liver, pancreas, adrenal glands, or spleen. No hypermetabolic lymph nodes in the abdomen or pelvis. Incidental CT findings: Large stone in the gallbladder measures 4 cm, image 130/4. Aortic atherosclerosis.  SKELETON: No focal hypermetabolic activity to suggest skeletal metastasis. Incidental CT findings: None. IMPRESSION: 1. Tracer avid left lower lobe lung mass is identified compatible with primary bronchogenic carcinoma. 2. Bilateral tracer avid pulmonary nodules compatible with metastatic disease. 3. No signs of tracer avid nodal metastasis or distant metastatic disease. 4.  Aortic Atherosclerosis (ICD10-I70.0). Electronically Signed   By: Kerby Moors M.D.   On: 03/07/2022 04:43   DG Chest Port 1 View  Result Date: 02/19/2022 CLINICAL DATA:  Congestive heart failure EXAM: PORTABLE CHEST 1 VIEW COMPARISON:  Chest radiograph, 10/23/2004 CT chest abdomen pelvis, 02/12/2022 FINDINGS: The heart size and mediastinal contours are within normal limits. Medial left lower lobe mass and multiple small bilateral pulmonary nodules better appreciated by prior CT. No acute appearing airspace opacity. The visualized skeletal structures are unremarkable. IMPRESSION: 1. Medial left lower lobe mass and multiple small bilateral pulmonary nodules better appreciated by prior CT. 2.  No acute appearing airspace opacity. Electronically Signed   By: Delanna Ahmadi M.D.   On: 02/19/2022 10:07   DG C-ARM BRONCHOSCOPY  Result Date: 02/19/2022 C-ARM BRONCHOSCOPY: Fluoroscopy was utilized by the requesting physician.  No radiographic interpretation.   MR BRAIN W WO CONTRAST  Result Date: 02/13/2022 CLINICAL DATA:  Assess for intracranial metastatic disease. History of right vestibular schwannoma. Recent abnormal neuro imaging with edema in the left brain. EXAM: MRI HEAD WITHOUT AND WITH CONTRAST TECHNIQUE: Multiplanar, multiecho pulse sequences of the brain and surrounding structures were obtained without and with intravenous contrast. CONTRAST:  75m GADAVIST GADOBUTROL 1 MMOL/ML IV SOLN COMPARISON:  CT yesterday.  MRI 01/26/2021 FINDINGS: Brain: There is a lobular up to 2.2 cm in diameter mass at the left posterior frontal  vertex with regional edema. There is a second 6 x 8 mm mass in the medial right posterior frontal vertex with minimal edema. These are most likely to be metastatic lesions. No evidence of ischemic change within the brain. Approximately 5 mm vestibular schwannoma remains visible in the right IAC. No hydrocephalus. No extra-axial collection. No evidence of hemorrhage or shift. Vascular: Major vessels at the base of the brain show flow. Skull and upper cervical spine: Negative Sinuses/Orbits: Clear/normal Other: None IMPRESSION: 1. 2.2 cm mass at the left posterior frontal vertex with regional edema. 6 x 8 mm mass at the medial right posterior frontal vertex with minimal edema. These are most likely metastatic lesions. 2. 5 mm vestibular schwannoma remains visible in the right IAC. Electronically Signed   By: MNelson ChimesM.D.   On: 02/13/2022 15:19   CT ANGIO HEAD NECK W WO CM  Result Date: 02/12/2022 CLINICAL DATA:  Stroke follow-up EXAM: CT ANGIOGRAPHY HEAD AND NECK TECHNIQUE: Multidetector CT imaging of the head and neck was performed using the standard protocol during bolus administration of intravenous contrast. Multiplanar CT image reconstructions and MIPs were obtained to evaluate the vascular anatomy. Carotid stenosis measurements (when applicable) are obtained utilizing NASCET criteria, using the distal internal carotid diameter as the denominator. RADIATION DOSE REDUCTION: This exam was performed according to the departmental dose-optimization program which includes automated exposure control, adjustment of the mA and/or kV according to patient size and/or use of iterative reconstruction technique. CONTRAST:  1044mOMNIPAQUE IOHEXOL 350 MG/ML SOLN COMPARISON:  Head CT 02/12/2019 FINDINGS: CTA NECK FINDINGS SKELETON: There is no bony spinal canal stenosis. No lytic or blastic lesion. OTHER NECK: Normal pharynx, larynx and major salivary glands. No cervical lymphadenopathy. Unremarkable thyroid gland.  UPPER CHEST: 8 mm nodules in the right upper and middle lobes AORTIC ARCH: There is calcific atherosclerosis of the aortic arch. There is no aneurysm, dissection or hemodynamically significant stenosis of the visualized portion of the aorta. Conventional 3 vessel aortic branching pattern. The visualized proximal subclavian arteries are widely patent. RIGHT CAROTID SYSTEM: Normal without aneurysm, dissection or stenosis. LEFT CAROTID SYSTEM: Normal without aneurysm, dissection or stenosis. VERTEBRAL ARTERIES: Left dominant configuration. Both origins are clearly patent. There is no dissection, occlusion or flow-limiting stenosis to the  skull base (V1-V3 segments). CTA HEAD FINDINGS Unchanged appearance of area of vasogenic edema in the left hemisphere. POSTERIOR CIRCULATION: --Vertebral arteries: Normal V4 segments. --Inferior cerebellar arteries: Normal. --Basilar artery: Normal. --Superior cerebellar arteries: Normal. --Posterior cerebral arteries (PCA): Normal. ANTERIOR CIRCULATION: --Intracranial internal carotid arteries: Normal. --Anterior cerebral arteries (ACA): Normal. Both A1 segments are present. Patent anterior communicating artery (a-comm). --Middle cerebral arteries (MCA): Normal. VENOUS SINUSES: As permitted by contrast timing, patent. ANATOMIC VARIANTS: None Review of the MIP images confirms the above findings. IMPRESSION: 1. No emergent large vessel occlusion or high-grade stenosis of the intracranial arteries. 2. Unchanged appearance of area of vasogenic edema in the left hemisphere. 3. A 8 mm nodules in the right upper and middle lobes, likely metastases. This is more completely characterized on concomitant CT chest, abdomen and pelvis. Aortic atherosclerosis (ICD10-I70.0). Electronically Signed   By: Ulyses Jarred M.D.   On: 02/12/2022 01:39   CT CHEST ABDOMEN PELVIS W CONTRAST  Result Date: 02/12/2022 CLINICAL DATA:  Brain tumor seen on CT query metastatic disease EXAM: CT CHEST, ABDOMEN,  AND PELVIS WITH CONTRAST TECHNIQUE: Multidetector CT imaging of the chest, abdomen and pelvis was performed following the standard protocol during bolus administration of intravenous contrast. RADIATION DOSE REDUCTION: This exam was performed according to the departmental dose-optimization program which includes automated exposure control, adjustment of the mA and/or kV according to patient size and/or use of iterative reconstruction technique. CONTRAST:  183m OMNIPAQUE IOHEXOL 350 MG/ML SOLN COMPARISON:  None Available. FINDINGS: CT CHEST FINDINGS Cardiovascular: Coronary artery calcifications. Aortic arch calcifications. Normal heart size. No pericardial effusion. Mediastinum/Nodes: No enlarged mediastinal, hilar, or axillary lymph nodes. Thyroid gland, trachea, and esophagus demonstrate no significant findings. Lungs/Pleura: Numerous bilateral small pulmonary nodules with the largest measuring 9 mm in the lateral right upper lobe (4/41). Irregular mass along the left lower lobe medially measures 5.4 x 4.1 by 5.4 cm. This obstructs multiple segmental right lower lobe bronchi. No pleural effusion or pneumothorax. Musculoskeletal: No chest wall mass or suspicious bone lesions identified. CT ABDOMEN PELVIS FINDINGS Hepatobiliary: Large 4.4 cm gallstone in the gallbladder neck. No evidence of cholecystitis. No biliary dilation. No suspicious hepatic lesion. Pancreas: Unremarkable. Spleen: Unremarkable. Adrenals/Urinary Tract: Normal adrenal glands. No suspicious renal mass. No hydronephrosis or urinary calculi. Unremarkable bladder. Stomach/Bowel: Normal caliber large and small bowel. Colonic diverticulosis without diverticulitis. Normal appendix. Unremarkable stomach. Vascular/Lymphatic: Aortic athero sclerotic calcification. There are few prominent subcentimeter upper retroperitoneal nodes (for example precaval node measuring 8 mm in short axis (3/61). Reproductive: Hysterectomy.  No suspicious adnexal mass. Other:  No free intraperitoneal fluid or air. Musculoskeletal: No acute osseous abnormality or destructive osseous lesion. Thoracolumbar spondylosis. Sclerosis in the left femoral head has a benign appearance. IMPRESSION: 1. 5.4 cm mass in the medial left lower lobe concerning for malignancy. 2. Additional bilateral pulmonary nodules measuring up to 9 mm suspicious for metastases. 3. No evidence of malignancy in the abdomen or pelvis. 4. 4.4 cm gallstone in the gallbladder neck. 5.  Aortic Atherosclerosis (ICD10-I70.0). Electronically Signed   By: TPlacido SouM.D.   On: 02/12/2022 01:31   CT HEAD CODE STROKE WO CONTRAST  Result Date: 02/11/2022 CLINICAL DATA:  Code stroke. EXAM: CT HEAD WITHOUT CONTRAST TECHNIQUE: Contiguous axial images were obtained from the base of the skull through the vertex without intravenous contrast. RADIATION DOSE REDUCTION: This exam was performed according to the departmental dose-optimization program which includes automated exposure control, adjustment of the mA and/or kV according to patient size  and/or use of iterative reconstruction technique. COMPARISON:  Brain MRI 01/25/2021 FINDINGS: Brain: There is a large area of vasogenic edema within the left frontal and parietal lobes. On the right, there is a punctate hyperdense focus with adjacent vasogenic edema at the superior paramedian right frontal lobe (3:30). No midline shift. Vascular: No abnormal hyperdensity of the major intracranial arteries or dural venous sinuses. No intracranial atherosclerosis. Skull: The visualized skull base, calvarium and extracranial soft tissues are normal. Sinuses/Orbits: No fluid levels or advanced mucosal thickening of the visualized paranasal sinuses. No mastoid or middle ear effusion. The orbits are normal. IMPRESSION: Large area of vasogenic edema in the left frontal/parietal lobe with overlying subarachnoid blood. Second, smaller lesion in the superior right frontal lobe. To gather appearance is  concerning for metastatic disease. MRI of the brain with and without contrast is suggested. Critical Value/emergent results were called by telephone at the time of interpretation on 02/11/2022 at 10:53 pm to provider [name] TRIFAN , who verbally acknowledged these results. Electronically Signed   By: Ulyses Jarred M.D.   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  There is an additional smaller metastasis within the right frontal lobe.  Case was discussed in CNS tumor board, recommendation was for radiosurgery to both lesions following 3T MRI study.  She is agreeable with this.  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  Decadron should be decreased to 36m daily x1 weeks, then decrease by 168mdaily each week until stopped.  Keppra will con't 50031mID for now.  We reviewed epilepsy safety, driving restrictions today.  We spent twenty additional minutes teaching regarding the natural history, biology, and historical experience in the treatment of neurologic complications of cancer.   We appreciate the opportunity to participate in the care of Carolyn Hooper We ask that Carolyn DUNIVANturn to clinic in 3 months following next brain MRI, or sooner as needed.  All questions were answered. The patient knows to call the clinic with any problems, questions or concerns. No barriers to learning were detected.  The total time spent in the encounter was 40 minutes and more than 50% was on counseling and review of test results   ZacVentura SellersD Medical Director of Neuro-Oncology [facility] WesShort/21/23 1:51 PM

## 2022-03-08 ENCOUNTER — Other Ambulatory Visit: Payer: Self-pay

## 2022-03-08 ENCOUNTER — Telehealth: Payer: Self-pay

## 2022-03-08 DIAGNOSIS — C7931 Secondary malignant neoplasm of brain: Secondary | ICD-10-CM | POA: Diagnosis not present

## 2022-03-08 DIAGNOSIS — C3432 Malignant neoplasm of lower lobe, left bronchus or lung: Secondary | ICD-10-CM | POA: Diagnosis not present

## 2022-03-08 NOTE — Telephone Encounter (Signed)
T/C from pt to confirm her decadron taper.  Pt advised, per last office,Decadron should be decreased to 4mg  daily x1 weeks, then decrease by 1mg  daily each week until stopped.  Pt with verbal understanding.

## 2022-03-08 NOTE — Progress Notes (Signed)
The proposed treatment discussed in conference is for discussion purpose only and is not a binding recommendation.  The patients have not been physically examined, or presented with their treatment options.  Therefore, final treatment plans cannot be decided.  

## 2022-03-12 ENCOUNTER — Other Ambulatory Visit: Payer: Self-pay | Admitting: Physician Assistant

## 2022-03-12 ENCOUNTER — Encounter (HOSPITAL_COMMUNITY): Payer: Self-pay

## 2022-03-12 ENCOUNTER — Telehealth: Payer: Self-pay | Admitting: *Deleted

## 2022-03-12 DIAGNOSIS — C3492 Malignant neoplasm of unspecified part of left bronchus or lung: Secondary | ICD-10-CM

## 2022-03-12 DIAGNOSIS — K1379 Other lesions of oral mucosa: Secondary | ICD-10-CM

## 2022-03-12 MED ORDER — MAGIC MOUTHWASH: 5.0000 mL | Freq: Three times a day (TID) | ORAL | 0 refills | Status: DC | PRN

## 2022-03-12 MED ORDER — LEVETIRACETAM 750 MG PO TABS: 750.0000 mg | ORAL_TABLET | Freq: Two times a day (BID) | ORAL | 3 refills | Status: DC

## 2022-03-12 NOTE — Addendum Note (Signed)
Addended by: Ventura Sellers on: 03/12/2022 09:15 AM   Modules accepted: Orders

## 2022-03-12 NOTE — Telephone Encounter (Signed)
Patient called to report that she has sores in her mouth since starting her treatment.  Mild white patching on her tongue.  Denies any pain with swallowing.  Salt products are an irritant.  Routing concern to [employer] [place].     Also reports that she had increased seizure activity yesterday in her left hand.  Today is better.  Still has some mild word stumbling issues but states it is not drastically different than before.  She is currently on Decadron 4 mg once daily and will start the taper on 03/14/2022 as instructed.  She will run out Keppra in 1 day and is unsure if she needs higher dose or not but either way she needs refill.  Staying with her mother in [place] so Pharmacy to forward to would be [facility] [address] instead of regular pharmacy.  Call back [phone].

## 2022-03-12 NOTE — Telephone Encounter (Signed)
Spoke with patient and communicated new Rx by both Cassie and Dr Mickeal Skinner.

## 2022-03-13 ENCOUNTER — Other Ambulatory Visit: Payer: Self-pay

## 2022-03-15 ENCOUNTER — Ambulatory Visit
Admission: RE | Admit: 2022-03-15 | Discharge: 2022-03-15 | Disposition: A | Discharge status: Home / Self Care | Payer: BC Managed Care – PPO | Source: Ambulatory Visit | Attending: Radiation Oncology | Admitting: Radiation Oncology

## 2022-03-15 DIAGNOSIS — C7931 Secondary malignant neoplasm of brain: Secondary | ICD-10-CM

## 2022-03-15 DIAGNOSIS — D333 Benign neoplasm of cranial nerves: Secondary | ICD-10-CM | POA: Diagnosis not present

## 2022-03-15 DIAGNOSIS — C711 Malignant neoplasm of frontal lobe: Secondary | ICD-10-CM | POA: Diagnosis not present

## 2022-03-15 MED ORDER — GADOPICLENOL 0.5 MMOL/ML IV SOLN
10.0000 mL | Freq: Once | INTRAVENOUS | Status: AC | PRN
  Administered 2022-03-15: 10 mL via INTRAVENOUS

## 2022-03-17 NOTE — Progress Notes (Signed)
Has armband been applied?  {yes no:314532}  Does patient have an allergy to IV contrast dye?: No.   Has patient ever received premedication for IV contrast dye?: n/a   Does patient take metformin?: {yes no:314532}  If patient does take metformin when was the last dose: {Time; dates multiple:15870}  Date of lab work: 03/06/2022 BUN: 30 CR: 0.76 eGfr: >60  IV site: {iv locations:314275}  Has IV site been added to flowsheet?  {yes no:314532}  There were no vitals taken for this visit.

## 2022-03-19 ENCOUNTER — Ambulatory Visit
Admission: RE | Admit: 2022-03-19 | Discharge: 2022-03-19 | Disposition: A | Discharge status: Home / Self Care | Payer: BC Managed Care – PPO | Source: Ambulatory Visit | Attending: Radiation Oncology | Admitting: Radiation Oncology

## 2022-03-19 ENCOUNTER — Other Ambulatory Visit: Payer: Self-pay

## 2022-03-19 VITALS — BP 132/61 | HR 69 | Temp 98.0°F | Resp 20 | Ht 68.0 in | Wt 243.0 lb

## 2022-03-19 DIAGNOSIS — C7931 Secondary malignant neoplasm of brain: Secondary | ICD-10-CM | POA: Insufficient documentation

## 2022-03-19 DIAGNOSIS — C3432 Malignant neoplasm of lower lobe, left bronchus or lung: Secondary | ICD-10-CM | POA: Insufficient documentation

## 2022-03-19 DIAGNOSIS — Z87891 Personal history of nicotine dependence: Secondary | ICD-10-CM | POA: Insufficient documentation

## 2022-03-19 DIAGNOSIS — C3490 Malignant neoplasm of unspecified part of unspecified bronchus or lung: Secondary | ICD-10-CM

## 2022-03-19 DIAGNOSIS — Z510 Encounter for antineoplastic radiation therapy: Secondary | ICD-10-CM | POA: Diagnosis not present

## 2022-03-19 DIAGNOSIS — G9389 Other specified disorders of brain: Secondary | ICD-10-CM

## 2022-03-19 MED ORDER — LORAZEPAM 1 MG PO TABS
0.5000 mg | ORAL_TABLET | Freq: Once | ORAL | Status: AC
  Administered 2022-03-19: 0.5 mg via ORAL
  Filled 2022-03-19: qty 0.5

## 2022-03-19 MED ORDER — SODIUM CHLORIDE 0.9% FLUSH: 10.0000 mL | INTRAVENOUS | Status: DC | PRN

## 2022-03-20 ENCOUNTER — Other Ambulatory Visit: Payer: Self-pay | Admitting: Radiation Oncology

## 2022-03-20 ENCOUNTER — Telehealth: Payer: Self-pay | Admitting: *Deleted

## 2022-03-20 ENCOUNTER — Telehealth: Payer: Self-pay | Admitting: Medical Oncology

## 2022-03-20 ENCOUNTER — Telehealth (HOSPITAL_COMMUNITY): Payer: Self-pay

## 2022-03-20 MED ORDER — NYSTATIN 100000 UNIT/ML MT SUSP: 5.0000 mL | Freq: Four times a day (QID) | OROMUCOSAL | 0 refills | Status: DC

## 2022-03-20 NOTE — Telephone Encounter (Signed)
Port a cath appt- LVM someone from radiology will call her.

## 2022-03-20 NOTE — Telephone Encounter (Signed)
Left patient a voicemail to let her know we sent in a prescription for Nystatin solution to her pharmacy, [facility] [place].  Call back number [phone] left if she has questions.  Gloriajean Dell. Leonie Green, BSN

## 2022-03-20 NOTE — Telephone Encounter (Signed)
Called to schedule port placement, no answer, left vm. AW  

## 2022-03-21 ENCOUNTER — Telehealth: Payer: Self-pay | Admitting: Medical Oncology

## 2022-03-21 NOTE — Telephone Encounter (Signed)
Phone. She said she goes back and forth between using her home phone and her mothers home phone. May need to call both numbers.    Port[facility] will call her with port app and give her instructions.  Thrush- She is taking MMW  and her mouth is feeling better . She wanted to know if it had an antifungal in it. I told her yes. I also told her xrt ordered Nystatin yesterday

## 2022-03-22 DIAGNOSIS — C7931 Secondary malignant neoplasm of brain: Secondary | ICD-10-CM | POA: Diagnosis not present

## 2022-03-22 DIAGNOSIS — Z87891 Personal history of nicotine dependence: Secondary | ICD-10-CM | POA: Diagnosis not present

## 2022-03-22 DIAGNOSIS — Z510 Encounter for antineoplastic radiation therapy: Secondary | ICD-10-CM | POA: Diagnosis not present

## 2022-03-22 DIAGNOSIS — C3432 Malignant neoplasm of lower lobe, left bronchus or lung: Secondary | ICD-10-CM | POA: Diagnosis not present

## 2022-03-26 ENCOUNTER — Ambulatory Visit
Admission: RE | Admit: 2022-03-26 | Discharge: 2022-03-26 | Disposition: A | Discharge status: Home / Self Care | Payer: BC Managed Care – PPO | Source: Ambulatory Visit | Attending: Radiation Oncology | Admitting: Radiation Oncology

## 2022-03-26 ENCOUNTER — Other Ambulatory Visit: Payer: Self-pay

## 2022-03-26 ENCOUNTER — Telehealth: Payer: Self-pay | Admitting: Radiation Oncology

## 2022-03-26 ENCOUNTER — Encounter: Payer: Self-pay | Admitting: Radiation Oncology

## 2022-03-26 DIAGNOSIS — Z87891 Personal history of nicotine dependence: Secondary | ICD-10-CM | POA: Diagnosis not present

## 2022-03-26 DIAGNOSIS — C3432 Malignant neoplasm of lower lobe, left bronchus or lung: Secondary | ICD-10-CM | POA: Diagnosis not present

## 2022-03-26 DIAGNOSIS — Z510 Encounter for antineoplastic radiation therapy: Secondary | ICD-10-CM | POA: Diagnosis not present

## 2022-03-26 DIAGNOSIS — C7931 Secondary malignant neoplasm of brain: Secondary | ICD-10-CM | POA: Diagnosis not present

## 2022-03-26 LAB — RAD ONC ARIA SESSION SUMMARY
Course Elapsed Days: 0
Plan Fractions Treated to Date: 1
Plan Prescribed Dose Per Fraction: 18 Gy
Plan Total Fractions Prescribed: 1
Plan Total Prescribed Dose: 18 Gy
Reference Point Dosage Given to Date: 18 Gy
Reference Point Session Dosage Given: 18 Gy
Session Number: 1

## 2022-03-26 MED FILL — Dexamethasone Sodium Phosphate Inj 100 MG/10ML: INTRAMUSCULAR | Qty: 1 | Status: AC

## 2022-03-26 MED FILL — Fosaprepitant Dimeglumine For IV Infusion 150 MG (Base Eq): INTRAVENOUS | Qty: 5 | Status: AC

## 2022-03-26 NOTE — Op Note (Signed)
  Name: Carolyn Hooper  MRN: [id number]  Date: 03/26/2022   DOB: [birth year]  Stereotactic Radiosurgery Operative Note  PRE-OPERATIVE DIAGNOSIS:  Multiple Brain Metastases  POST-OPERATIVE DIAGNOSIS:  Multiple Brain Metastases  PROCEDURE:  Stereotactic Radiosurgery  SURGEON:  Charlie Pitter, MD  NARRATIVE: The patient underwent a radiation treatment planning session in the radiation oncology simulation suite under the care of the radiation oncology physician and physicist.  I participated closely in the radiation treatment planning afterwards. The patient underwent planning CT which was fused to 3T high resolution MRI with 1 mm axial slices.  These images were fused on the planning system.  We contoured the gross target volumes and subsequently expanded this to yield the Planning Target Volume. I actively participated in the planning process.  I helped to define and review the target contours and also the contours of the optic pathway, eyes, brainstem and selected nearby organs at risk.  All the dose constraints for critical structures were reviewed and compared to AAPM Task Group 101.  The prescription dose conformity was reviewed.  I approved the plan electronically.    Accordingly, Carolyn Hooper was brought to the TrueBeam stereotactic radiation treatment linac and placed in the custom immobilization mask.  The patient was aligned according to the IR fiducial markers with BrainLab Exactrac, then orthogonal x-rays were used in ExacTrac with the 6DOF robotic table and the shifts were made to align the patient  Carolyn Hooper received stereotactic radiosurgery uneventfully.    Lesions treated:  2   Complex lesions treated:  0 (>3.5 cm, <11mm of optic path, or within the brainstem)   The detailed description of the procedure is recorded in the radiation oncology procedure note.  I was present for the duration of the procedure.  DISPOSITION:  Following delivery, the patient was transported to nursing  in stable condition and monitored for possible acute effects to be discharged to home in stable condition with follow-up in one month.  Charlie Pitter, MD 03/26/2022 2:24 PM

## 2022-03-26 NOTE — Telephone Encounter (Signed)
Delayed entry. Pt called 03/25/22 and had questions about her treatment scheduled for 03/26/22. I called her back the same day and left a message asking her to call back so we could answer her concerns.

## 2022-03-26 NOTE — Progress Notes (Signed)
Carolyn Hooper rested with [place] for 30 minutes following her [place] treatment.  Patient denies headache, dizziness, nausea, diplopia or ringing in the ears. Denies fatigue. Patient without complaints. Understands to avoid strenuous activity for the next 24 hours and call [phone] with needs.  She had some concerns regarding her decadron dose and speech changes since lowering her dosage.  I reached out to Dr. Mickeal Skinner who advised that she increase her steroid to 3 mg daily with breakfast.  She verbalized understanding of her medication changes.  She walked out of the nursing clinic with her son and mother without difficulty.   Carolyn Hooper. Leonie Green, BSN

## 2022-03-27 ENCOUNTER — Encounter (HOSPITAL_COMMUNITY): Payer: Self-pay | Admitting: Emergency Medicine

## 2022-03-27 ENCOUNTER — Inpatient Hospital Stay: Payer: BC Managed Care – PPO

## 2022-03-27 ENCOUNTER — Telehealth: Payer: Self-pay

## 2022-03-27 ENCOUNTER — Emergency Department (HOSPITAL_COMMUNITY)
Admission: EM | Admit: 2022-03-27 | Discharge: 2022-03-27 | Disposition: A | Discharge status: Home / Self Care | Payer: BC Managed Care – PPO | Attending: Emergency Medicine | Admitting: Emergency Medicine

## 2022-03-27 ENCOUNTER — Other Ambulatory Visit: Payer: Self-pay

## 2022-03-27 ENCOUNTER — Inpatient Hospital Stay (HOSPITAL_BASED_OUTPATIENT_CLINIC_OR_DEPARTMENT_OTHER): Payer: BC Managed Care – PPO | Admitting: Internal Medicine

## 2022-03-27 ENCOUNTER — Other Ambulatory Visit: Payer: Self-pay | Admitting: Internal Medicine

## 2022-03-27 ENCOUNTER — Encounter: Payer: Self-pay | Admitting: Internal Medicine

## 2022-03-27 ENCOUNTER — Other Ambulatory Visit: Payer: Self-pay | Admitting: Radiology

## 2022-03-27 ENCOUNTER — Inpatient Hospital Stay: Payer: BC Managed Care – PPO | Attending: Physician Assistant

## 2022-03-27 DIAGNOSIS — Z87891 Personal history of nicotine dependence: Secondary | ICD-10-CM | POA: Diagnosis not present

## 2022-03-27 DIAGNOSIS — R4701 Aphasia: Secondary | ICD-10-CM | POA: Diagnosis not present

## 2022-03-27 DIAGNOSIS — Z7984 Long term (current) use of oral hypoglycemic drugs: Secondary | ICD-10-CM | POA: Insufficient documentation

## 2022-03-27 DIAGNOSIS — E119 Type 2 diabetes mellitus without complications: Secondary | ICD-10-CM | POA: Diagnosis not present

## 2022-03-27 DIAGNOSIS — C3432 Malignant neoplasm of lower lobe, left bronchus or lung: Secondary | ICD-10-CM | POA: Insufficient documentation

## 2022-03-27 DIAGNOSIS — C7931 Secondary malignant neoplasm of brain: Secondary | ICD-10-CM | POA: Insufficient documentation

## 2022-03-27 DIAGNOSIS — C3492 Malignant neoplasm of unspecified part of left bronchus or lung: Secondary | ICD-10-CM

## 2022-03-27 DIAGNOSIS — R569 Unspecified convulsions: Secondary | ICD-10-CM | POA: Insufficient documentation

## 2022-03-27 DIAGNOSIS — Z9079 Acquired absence of other genital organ(s): Secondary | ICD-10-CM | POA: Insufficient documentation

## 2022-03-27 DIAGNOSIS — Z90721 Acquired absence of ovaries, unilateral: Secondary | ICD-10-CM | POA: Insufficient documentation

## 2022-03-27 DIAGNOSIS — Z5112 Encounter for antineoplastic immunotherapy: Secondary | ICD-10-CM | POA: Insufficient documentation

## 2022-03-27 DIAGNOSIS — Z79899 Other long term (current) drug therapy: Secondary | ICD-10-CM | POA: Insufficient documentation

## 2022-03-27 DIAGNOSIS — I10 Essential (primary) hypertension: Secondary | ICD-10-CM | POA: Diagnosis not present

## 2022-03-27 DIAGNOSIS — C3490 Malignant neoplasm of unspecified part of unspecified bronchus or lung: Secondary | ICD-10-CM

## 2022-03-27 DIAGNOSIS — R064 Hyperventilation: Secondary | ICD-10-CM | POA: Diagnosis not present

## 2022-03-27 DIAGNOSIS — Z90710 Acquired absence of both cervix and uterus: Secondary | ICD-10-CM | POA: Insufficient documentation

## 2022-03-27 DIAGNOSIS — Z5111 Encounter for antineoplastic chemotherapy: Secondary | ICD-10-CM | POA: Insufficient documentation

## 2022-03-27 DIAGNOSIS — Z7952 Long term (current) use of systemic steroids: Secondary | ICD-10-CM | POA: Insufficient documentation

## 2022-03-27 HISTORY — DX: Malignant neoplasm of unspecified part of unspecified bronchus or lung: C34.90

## 2022-03-27 HISTORY — DX: Malignant neoplasm of unspecified part of unspecified bronchus or lung: C79.31

## 2022-03-27 HISTORY — DX: Type 2 diabetes mellitus without complications: E11.9

## 2022-03-27 LAB — CMP (CANCER CENTER ONLY)
ALT: 78 U/L — ABNORMAL HIGH (ref 0–44)
AST: 20 U/L (ref 15–41)
Albumin: 3.6 g/dL (ref 3.5–5.0)
Alkaline Phosphatase: 47 U/L (ref 38–126)
Anion gap: 10 (ref 5–15)
BUN: 21 mg/dL (ref 8–23)
CO2: 27 mmol/L (ref 22–32)
Calcium: 9.4 mg/dL (ref 8.9–10.3)
Chloride: 99 mmol/L (ref 98–111)
Creatinine: 0.69 mg/dL (ref 0.44–1.00)
GFR, Estimated: >60 mL/min (ref >60)
Glucose, Bld: 315 mg/dL — ABNORMAL HIGH (ref 70–99)
Potassium: 4.2 mmol/L (ref 3.5–5.1)
Sodium: 136 mmol/L (ref 135–145)
Total Bilirubin: 0.4 mg/dL (ref 0.3–1.2)
Total Protein: 6 g/dL — ABNORMAL LOW (ref 6.5–8.1)

## 2022-03-27 LAB — CBC WITH DIFFERENTIAL (CANCER CENTER ONLY)
Abs Immature Granulocytes: 0.22 10*3/uL — ABNORMAL HIGH (ref 0.00–0.07)
Basophils Absolute: 0.1 10*3/uL (ref 0.0–0.1)
Basophils Relative: 1 %
Eosinophils Absolute: 0 10*3/uL (ref 0.0–0.5)
Eosinophils Relative: 0 %
HCT: 41.1 % (ref 36.0–46.0)
Hemoglobin: 14.1 g/dL (ref 12.0–15.0)
Immature Granulocytes: 3 %
Lymphocytes Relative: 13 %
Lymphs Abs: 1.2 10*3/uL (ref 0.7–4.0)
MCH: 29.8 pg (ref 26.0–34.0)
MCHC: 34.3 g/dL (ref 30.0–36.0)
MCV: 86.9 fL (ref 80.0–100.0)
Monocytes Absolute: 0.4 10*3/uL (ref 0.1–1.0)
Monocytes Relative: 5 %
Neutro Abs: 7.1 10*3/uL (ref 1.7–7.7)
Neutrophils Relative %: 78 %
Platelet Count: 161 10*3/uL (ref 150–400)
RBC: 4.73 MIL/uL (ref 3.87–5.11)
RDW: 15.7 % — ABNORMAL HIGH (ref 11.5–15.5)
WBC Count: 9 10*3/uL (ref 4.0–10.5)
nRBC: 0 % (ref 0.0–0.2)

## 2022-03-27 MED ORDER — DIAZEPAM 5 MG/ML IJ SOLN
5.0000 mg | Freq: Once | INTRAMUSCULAR | Status: AC
  Administered 2022-03-27: 5 mg via INTRAVENOUS
  Filled 2022-03-27: qty 2

## 2022-03-27 NOTE — Telephone Encounter (Signed)
LM for pt (Mother's answering machine) with instructions and sent a message via My Chart

## 2022-03-27 NOTE — Progress Notes (Signed)
[facility] [phone]   [phone]  OFFICE PROGRESS NOTE  [name], [place] [address]  DIAGNOSIS: Stage IV (T3, N0, M1b) non-small cell lung cancer, adenosquamous carcinoma presented with large medial left lower lobe lung mass with bilateral pulmonary nodules and metastatic disease to the brain diagnosed in November 2023  PRIOR THERAPY: Status post SRS to brain metastasis completed March 26, 2022.  CURRENT THERAPY: Systemic chemotherapy with carboplatin for AUC of 5, Alimta 500 Mg/M2 and Keytruda 200 Mg IV every 3 weeks status post 1 cycle.  First dose was given on March 06, 2022.  INTERVAL HISTORY: Carolyn Hooper 62 y.o. female returns to the clinic today for follow-up visit accompanied by her son.  The patient is feeling weak and fatigued today.  She was at the emergency department last night with several episodes of seizure after her Lead treatment yesterday.  Dr. Mickeal Skinner was consulted and he increased her dose of Keppra to 2625 mg daily.  The patient also on Decadron 3 mg p.o. daily.  She has cushingoid features and also was very groggy today.  She denied having any chest pain, shortness breath, cough or hemoptysis.  She has no nausea, vomiting, diarrhea or constipation.  She tolerated the first cycle of her treatment fairly well.  She is here today for evaluation before starting cycle #2.  MEDICAL HISTORY: Past Medical History:  Diagnosis Date   Abscess    hydroadenitis superativa   Anxiety    DM (diabetes mellitus) ([place])    Hyperinsulinemia    Hypertension 1983   gestational - pre-eclampsia   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[facility])    Neuroma    audiotry neuroma   Seizure ([place])    Vertigo     ALLERGIES:  is allergic to adhesive [tape] and statins.  MEDICATIONS:  Current Outpatient Medications  Medication Sig Dispense Refill   magic mouthwash SOLN Take 5 mLs by mouth 3 (three) times daily as  needed for mouth pain. 240 mL 0   albuterol (VENTOLIN HFA) 108 (90 Base) MCG/ACT inhaler Inhale 2 puffs into the lungs every 6 (six) hours as needed. 18 g 2   Aspirin-Acetaminophen-Caffeine (GOODYS EXTRA STRENGTH) 500-325-65 MG PACK Take 1 packet by mouth daily as needed (headaches).     blood glucose meter kit and supplies Dispense based on patient and insurance preference. Use up to four times daily as directed. (FOR ICD-10 E10.9, E11.9). 1 each 0   clonazePAM (KLONOPIN) 0.5 MG tablet Take 0.5 tablets (0.25 mg total) by mouth at bedtime. For anxiety (Patient taking differently: Take 0.25 mg by mouth daily as needed for anxiety (and vertigo).) 30 tablet 2   dexamethasone (DECADRON) 1 MG tablet Take 3 tablets (3 mg total) by mouth daily with breakfast for 7 days, THEN 2 tablets (2 mg total) daily with breakfast for 7 days, THEN 1 tablet (1 mg total) daily with breakfast for 7 days. 42 tablet 0   folic acid (FOLVITE) 1 MG tablet Take 1 tablet (1 mg total) by mouth daily. 30 tablet 2   levETIRAcetam (KEPPRA) 750 MG tablet Take 1 tablet (750 mg total) by mouth 2 (two) times daily. 60 tablet 3   metFORMIN (GLUCOPHAGE) 1000 MG tablet Take 1 tablet (1,000 mg total) by mouth 2 (two) times daily. (Patient taking differently: Take 500-1,000 mg by mouth See admin instructions. 500 mg or 1000 mg with meals depending on how much carbs  and sugars Pt eats) 180 tablet 1   metFORMIN (GLUCOPHAGE) 850 MG tablet Take 425-850 mg by mouth See admin instructions. 425 mg - 850 mg with meals, depending on how much carbs and sugars Pt eats     nystatin (MYCOSTATIN) 100000 UNIT/ML suspension Take 5 mLs (500,000 Units total) by mouth 4 (four) times daily. 60 mL 0   ondansetron (ZOFRAN) 4 MG tablet Take 1 tablet (4 mg total) by mouth every 6 (six) hours as needed for nausea. 20 tablet 0   prochlorperazine (COMPAZINE) 10 MG tablet Take 1 tablet (10 mg total) by mouth every 6 (six) hours as needed. 30 tablet 2   No current  facility-administered medications for this visit.    SURGICAL HISTORY:  Past Surgical History:  Procedure Laterality Date   ABDOMINAL HYSTERECTOMY  2006   remaining left ovary   BRONCHIAL BIOPSY  02/19/2022   Procedure: BRONCHIAL BIOPSIES;  Surgeon: Garner Nash, DO;  Location: [facility];  Service: Pulmonary;;   BRONCHIAL BRUSHINGS  02/19/2022   Procedure: BRONCHIAL BRUSHINGS;  Surgeon: Garner Nash, DO;  Location: [facility];  Service: Pulmonary;;   BRONCHIAL NEEDLE ASPIRATION BIOPSY  02/19/2022   Procedure: BRONCHIAL NEEDLE ASPIRATION BIOPSIES;  Surgeon: Garner Nash, DO;  Location: [facility];  Service: Pulmonary;;   SHOULDER SURGERY Right 1960s    REVIEW OF SYSTEMS:  Constitutional: positive for fatigue Eyes: negative Ears, nose, mouth, throat, and face: negative Respiratory: negative Cardiovascular: negative Gastrointestinal: negative Genitourinary:negative Integument/breast: negative Hematologic/lymphatic: negative Musculoskeletal:positive for muscle weakness Neurological: positive for coordination problems, seizures, and weakness Behavioral/Psych: negative Endocrine: negative Allergic/Immunologic: negative   PHYSICAL EXAMINATION: General appearance: alert, cooperative, fatigued, and no distress Head: Normocephalic, without obvious abnormality, atraumatic Neck: no adenopathy, no JVD, supple, symmetrical, trachea midline, and thyroid not enlarged, symmetric, no tenderness/mass/nodules Lymph nodes: Cervical, supraclavicular, and axillary nodes normal. Resp: clear to auscultation bilaterally Back: symmetric, no curvature. ROM normal. No CVA tenderness. Cardio: regular rate and rhythm, S1, S2 normal, no murmur, click, rub or gallop GI: soft, non-tender; bowel sounds normal; no masses,  no organomegaly Extremities: extremities normal, atraumatic, no cyanosis or edema Neurologic: Alert and oriented X 3, normal strength and tone. Normal symmetric reflexes.  Normal coordination and gait  ECOG PERFORMANCE STATUS: 1 - Symptomatic but completely ambulatory  Blood pressure 123/61, pulse 69, temperature 97.8 F (36.6 C), temperature source Temporal, resp. rate 18, weight 245 lb 7 oz (111.3 kg), SpO2 95 %.  LABORATORY DATA: Lab Results  Component Value Date   WBC 9.0 03/27/2022   HGB 14.1 03/27/2022   HCT 41.1 03/27/2022   MCV 86.9 03/27/2022   PLT 161 03/27/2022      Chemistry      Component Value Date/Time   NA 136 03/27/2022 1317   NA 141 11/01/2021 1009   K 4.2 03/27/2022 1317   CL 99 03/27/2022 1317   CO2 27 03/27/2022 1317   BUN 21 03/27/2022 1317   BUN 13 11/01/2021 1009   CREATININE 0.69 03/27/2022 1317      Component Value Date/Time   CALCIUM 9.4 03/27/2022 1317   ALKPHOS 47 03/27/2022 1317   AST 20 03/27/2022 1317   ALT 78 (H) 03/27/2022 1317   BILITOT 0.4 03/27/2022 1317       RADIOGRAPHIC STUDIES: MR Brain W Wo Contrast  Result Date: 03/19/2022 CLINICAL DATA:  62 year old female with non-small cell lung cancer. History of treated right side vestibular schwannoma. Brain MRI in November revealing bilateral superior frontal lobe brain  metastases. Reportedly status post SRS. Subsequent encounter. EXAM: MRI HEAD WITHOUT AND WITH CONTRAST TECHNIQUE: Multiplanar, multiecho pulse sequences of the brain and surrounding structures were obtained without and with intravenous contrast. CONTRAST:  10 mL Vueway COMPARISON:  Brain MRI 02/13/2022 and earlier. FINDINGS: Brain: Thin slice postcontrast imaging today compared to November. Lobulated and enhancing left middle frontal gyrus mass is 20 x 20 x 26 mm (AP by transverse by CC). Visually, this has not significantly changed in size or configuration since November. Regional T2 and FLAIR hyperintense vasogenic edema has regressed but not resolved (series 10, image 44). Mild regional mass effect has regressed. Mild hemosiderin is stable. Contralateral small and round medial right  perirolandic 8 mm enhancing metastasis, also visually stable since November and with no residual edema today (series 10, image 49). Small chronic right internal auditory canal enhancing tumor is stable since 2022 (series 13, image 54). No new abnormal enhancement identified. No dural thickening. No superimposed restricted diffusion to suggest acute infarction. No midline shift, ventriculomegaly, extra-axial collection or acute intracranial hemorrhage. Cervicomedullary junction and pituitary are within normal limits. Outside of the treatment areas gray and white matter signal is stable and within normal limits. Vascular: Major intracranial vascular flow voids are stable. Skull and upper cervical spine: Negative visible cervical spine. Skull bone marrow signal is stable. Sinuses/Orbits: Stable, negative. Other: Mastoids remain clear. IMPRESSION: 1. Stable size of two bilateral superior frontal lobe brain metastases since November. Surrounding edema has regressed at both (resolved on the right). 2. Chronic small right vestibular schwannoma. No new metastatic disease or intracranial abnormality identified. Electronically Signed   By: Genevie Ann M.D.   On: 03/19/2022 09:03   NM PET Image Initial (PI) Skull Base To Thigh (F-18 FDG)  Result Date: 03/07/2022 CLINICAL DATA:  Initial treatment strategy for non-small cell lung cancer. EXAM: NUCLEAR MEDICINE PET SKULL BASE TO THIGH TECHNIQUE: 11.67 mCi F-18 FDG was injected intravenously. Full-ring PET imaging was performed from the skull base to thigh after the radiotracer. CT data was obtained and used for attenuation correction and anatomic localization. Fasting blood glucose: 158 mg/dl COMPARISON:  CT chest, abdomen and pelvis 02/12/2022 FINDINGS: Mediastinal blood pool activity: SUV max 3.12 Liver activity: SUV max NA NECK: No hypermetabolic lymph nodes in the neck. Incidental CT findings: None. CHEST: Tracer avid subpleural mass within the posteromedial left lower lobe  measures 5.2 x 4.4 cm with SUV max of 8.92, image 48/7. Multiple bilateral pulmonary nodules are identified concerning for metastatic disease. Index lesions include: -right upper lobe lung nodule measures 0.9 cm with SUV max of 2.38, image 25/7. -left lower lobe lung nodule measures 0.7 cm with SUV max of 7.92, image 36/7. No tracer avid axillary, mediastinal or hilar lymph nodes. Incidental CT findings: Aortic atherosclerosis and coronary artery calcifications. ABDOMEN/PELVIS: No abnormal hypermetabolic activity within the liver, pancreas, adrenal glands, or spleen. No hypermetabolic lymph nodes in the abdomen or pelvis. Incidental CT findings: Large stone in the gallbladder measures 4 cm, image 130/4. Aortic atherosclerosis. SKELETON: No focal hypermetabolic activity to suggest skeletal metastasis. Incidental CT findings: None. IMPRESSION: 1. Tracer avid left lower lobe lung mass is identified compatible with primary bronchogenic carcinoma. 2. Bilateral tracer avid pulmonary nodules compatible with metastatic disease. 3. No signs of tracer avid nodal metastasis or distant metastatic disease. 4.  Aortic Atherosclerosis (ICD10-I70.0). Electronically Signed   By: Kerby Moors M.D.   On: 03/07/2022 04:43    ASSESSMENT AND PLAN: This is a very pleasant 61 years  old white female with Stage IV (T3, N0, M1b) non-small cell lung cancer, adenosquamous carcinoma presented with large medial left lower lobe lung mass with bilateral pulmonary nodules and metastatic disease to the brain diagnosed in November 2023 She is status post SRS to brain metastasis on March 26, 8370 that was complicated last night with several episodes of seizure activity and the patient was seen at the emergency department and her dose of Keppra was increased by Dr. Mickeal Skinner.  She is also on a tapered dose of Decadron 3 mg p.o. daily. The patient is currently on systemic chemotherapy with carboplatin for AUC of 5, Alimta 500 Mg/M2 and Keytruda 200  Mg IV every 3 weeks status post 1 cycle.  She tolerated the first cycle of her treatment fairly well.  She was supposed to start cycle #2 today. The patient is not feeling well after her SRS procedure yesterday. I recommended for her to delay the start of cycle number 2 by 1 week until improvement of her condition. I will see her back for follow-up visit at that time. For the seizure activity, she is followed by Dr. Mickeal Skinner and he will continue to monitor her dose of Keppra closely. The patient was advised to call immediately if she has any other concerning symptoms in the interval. The patient voices understanding of current disease status and treatment options and is in agreement with the current care plan.  All questions were answered. The patient knows to call the clinic with any problems, questions or concerns. We can certainly see the patient much sooner if necessary.  The total time spent in the appointment was 40 minutes.  Disclaimer: This note was dictated with voice recognition software. Similar sounding words can inadvertently be transcribed and may not be corrected upon review.

## 2022-03-27 NOTE — Progress Notes (Addendum)
                                                                                                                                                             Patient Name: Carolyn Hooper MRN: [id number] DOB: 06/04/1960 Referring Physician: Curt Bears (Profile Not Attached) Date of Service: 03/26/2022 [facility], [place]                                                        End Of Treatment Note  Diagnoses: C79.31-Secondary malignant neoplasm of brain  Cancer Staging: Stage IV, NSCLC, possible adenosquamous histology of the LLL with brain metastases; Right Vestibular Schwannoma.  Intent: Palliative  Radiation Treatment Dates: 03/26/2022 through 03/26/2022 Site Technique Total Dose (Gy) Dose per Fx (Gy) Completed Fx Beam Energies  Brain: SRS Treatment [id number] PTV_2_Rperiolandic_74mm IMRT 18/18 18 1/1 [id number]   Narrative: The patient tolerated radiation therapy relatively well.  Her steroids are being tapered by Dr. Mickeal Skinner and he is managing her seizure history as well.  Plan: The patient will receive a call in about one month from the radiation oncology department. She will continue follow up with Dr. Mickeal Skinner as well. He will also follow her vestibular schwannoma in surveillance, but we would consider treatment if this progressed.   ________________________________________________    Carola Rhine, PAC

## 2022-03-27 NOTE — ED Provider Notes (Signed)
[facility] DEPT Provider Note: Georgena Spurling, MD, FACEP  CSN: [id number] MRN: [id number] ARRIVAL: 03/27/22 at [place]: [place]  Seizure   HISTORY OF PRESENT ILLNESS  03/27/22 2:35 AM Carolyn Hooper is a 62 y.o. 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  Her son witnessed a seizure just prior to arrival that lasted about 5 to 7 minutes.  It involve full body jerking but she did not fall to the ground.  This is only the second seizure she has had, the first occurred on 02/11/2022.  She is on Keppra which she takes twice daily and has been compliant with this.  Following the seizure she had some difficulty speaking also occurred after the previous seizure.  She denies any injury.   Past Medical History:  Diagnosis Date   Abscess    hydroadenitis superativa   Anxiety    DM (diabetes mellitus) ([place])    Hyperinsulinemia    Hypertension 1983   gestational - pre-eclampsia   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[facility])    Neuroma    audiotry neuroma   Seizure ([place])    Vertigo     Past Surgical History:  Procedure Laterality Date   ABDOMINAL HYSTERECTOMY  2006   remaining left ovary   BRONCHIAL BIOPSY  02/19/2022   Procedure: BRONCHIAL BIOPSIES;  Surgeon: Garner Nash, DO;  Location: [facility];  Service: Pulmonary;;   BRONCHIAL BRUSHINGS  02/19/2022   Procedure: BRONCHIAL BRUSHINGS;  Surgeon: Garner Nash, DO;  Location: [facility];  Service: Pulmonary;;   BRONCHIAL NEEDLE ASPIRATION BIOPSY  02/19/2022   Procedure: BRONCHIAL NEEDLE ASPIRATION BIOPSIES;  Surgeon: Garner Nash, DO;  Location: [facility];  Service: Pulmonary;;   SHOULDER SURGERY Right 1960s    Family History  Problem Relation Age of Onset   COPD Mother    Heart disease Father    Diabetes Father    Kidney disease Father    Arthritis Father        RA   Liver disease Father     Social History    Tobacco Use   Smoking status: Former    Packs/day: 0.75    Types: Cigarettes    Quit date: 02/11/2022    Years since quitting: 0.1   Smokeless tobacco: Never  Vaping Use   Vaping Use: Never used  Substance Use Topics   Alcohol use: Yes    Comment: very rare   Drug use: No    Prior to Admission medications   Medication Sig Start Date End Date Taking? Authorizing Provider  magic mouthwash SOLN Take 5 mLs by mouth 3 (three) times daily as needed for mouth pain. 03/12/22   Heilingoetter, [name], PA-C  albuterol (VENTOLIN HFA) 108 (90 Base) MCG/ACT inhaler Inhale 2 puffs into the lungs every 6 (six) hours as needed. 01/08/21   [name], FNP  Aspirin-Acetaminophen-Caffeine (GOODYS EXTRA STRENGTH) [phone] MG PACK Take 1 packet by mouth daily as needed (headaches).    [provider]  blood glucose meter kit and supplies Dispense based on patient and insurance preference. Use up to four times daily as directed. (FOR ICD-10 E10.9, E11.9). 09/21/19   Evelina Dun A, FNP  clonazePAM (KLONOPIN) 0.5 MG tablet Take 0.5 tablets (0.25 mg total) by mouth at bedtime. For anxiety Patient taking differently: Take 0.25 mg by mouth daily as needed for anxiety (and vertigo). 11/01/21   Hawks,  [name] A, FNP  dexamethasone (DECADRON) 1 MG tablet Take 3 tablets (3 mg total) by mouth daily with breakfast for 7 days, THEN 2 tablets (2 mg total) daily with breakfast for 7 days, THEN 1 tablet (1 mg total) daily with breakfast for 7 days. 03/14/22 04/04/22  Ventura Sellers, MD  folic acid (FOLVITE) 1 MG tablet Take 1 tablet (1 mg total) by mouth daily. 02/27/22   Heilingoetter, [name], PA-C  levETIRAcetam (KEPPRA) 750 MG tablet Take 1 tablet (750 mg total) by mouth 2 (two) times daily. 03/12/22   Ventura Sellers, MD  metFORMIN (GLUCOPHAGE) 1000 MG tablet Take 1 tablet (1,000 mg total) by mouth 2 (two) times daily. Patient taking differently: Take 500-1,000 mg by mouth See admin  instructions. 500 mg or 1000 mg with meals depending on how much carbs and sugars Pt eats 11/01/21   Evelina Dun A, FNP  metFORMIN (GLUCOPHAGE) 850 MG tablet Take 425-850 mg by mouth See admin instructions. 425 mg - 850 mg with meals, depending on how much carbs and sugars Pt eats    [provider]  nystatin (MYCOSTATIN) 100000 UNIT/ML suspension Take 5 mLs (500,000 Units total) by mouth 4 (four) times daily. 03/20/22   Hayden Pedro, PA-C  ondansetron (ZOFRAN) 4 MG tablet Take 1 tablet (4 mg total) by mouth every 6 (six) hours as needed for nausea. 02/13/22   Danford, Suann Larry, MD  prochlorperazine (COMPAZINE) 10 MG tablet Take 1 tablet (10 mg total) by mouth every 6 (six) hours as needed. 02/25/22   Heilingoetter, [name], PA-C    Allergies Adhesive [tape] and Statins   REVIEW OF SYSTEMS  Negative except as noted here or in the History of Present Illness.   PHYSICAL EXAMINATION  Initial Vital Signs Blood pressure 135/77, pulse 65, temperature 98 F (36.7 C), temperature source Oral, resp. rate 15, SpO2 97 %.  Examination General: Well-developed, well-nourished female in no acute distress; appearance consistent with age of record HENT: normocephalic; atraumatic; no bite mark on tongue Eyes: pupils equal, round and reactive to light; extraocular muscles intact Neck: supple Heart: regular rate and rhythm Lungs: clear to auscultation bilaterally Abdomen: soft; nondistended; nontender; bowel sounds present Extremities: No deformity; full range of motion; pulses normal Neurologic: Awake, alert; mild dysarthria; motor function intact in all extremities and symmetric; no facial droop Skin: Warm and dry Psychiatric: Normal mood and affect   RESULTS  Summary of this visit's results, reviewed and interpreted by myself:   EKG Interpretation  Date/Time:    Ventricular Rate:    PR Interval:    QRS Duration:   QT Interval:    QTC Calculation:   R Axis:      Text Interpretation:         Laboratory Studies: No results found for this or any previous visit (from the past 24 hour(s)). Imaging Studies: No results found.  ED COURSE and MDM  Nursing notes, initial and subsequent vitals signs, including pulse oximetry, reviewed and interpreted by myself.  Vitals:   03/27/22 0229 03/27/22 0230 03/27/22 0234  BP:  135/77   Pulse:  65   Resp:  15   Temp:  98 F (36.7 C)   TempSrc:  Oral   SpO2: 98% 97%   Weight:   108.9 kg  Height:   5\' 8"  (1.727 m)   Medications  diazepam (VALIUM) injection 5 mg (5 mg Intravenous Given 03/27/22 0258)   3:44 AM Patient was given 5 mg of  IV Valium.  She is still awake and alert and has ambulated without difficulty.  She is comfortable going home with her son.  She has chemotherapy later today and will advise them of her seizure.   PROCEDURES  Procedures   ED DIAGNOSES     ICD-10-CM   1. Seizure with provoking factor ([place])  R56.9          Alexandre Lightsey, MD 03/27/22 0345

## 2022-03-27 NOTE — ED Triage Notes (Signed)
Pt arrived via [facility] from home for seizure son stated seizure lasted 5-80mins, "full body jerking", unable to speak per son. Standing the whole time no fall or loss of conscious. pt 2nd seizure event. 1st seizure 11/27. Dx with brain tumor on 11/29. Radiation was yesterday 2pm. Seizure is Side affect to radiation on keppra, bid.took both dose yesterday.asphasia and some common is normal after seizure. AAOx4.   Carolyn Hooper

## 2022-03-27 NOTE — Telephone Encounter (Signed)
Pt called regarding seizures encountered. 03/27/2022 #1 - 0130 #2 - 0430  After #1 went to ER and was treated and released. States symptoms mimic similar to before [facility] treatment. R eye blinking, couldn't breath, son describes patient as hyperventilating.

## 2022-03-27 NOTE — Telephone Encounter (Signed)
I spoke with Rodman Key, pt's son and the pt regarding pt's medication after last night seizure and ED visit.  Pt was advised to increase her dose of Keppra  am dose to 2625 mg and prednisone 3 mg dose.  Pt is asking what is the PM dose for today and her continuing dose for Keppra and prednisone.  Pt is also asking for a prescription for valium to help with her anxiety and to help decrease possible seizures  Please advise

## 2022-03-27 NOTE — Telephone Encounter (Signed)
Per Dr. Mickeal Skinner, Pt to take 2000 mg of Keppra at this time then 1000mg  BID. If still continuing to seize the patient is to go immediately to the ED for further treatment   Sent message to Abelina Bachelor. Pt is scheduled to have chemo today and doesn't know if she is to come still or not. Asked Diane to advise or call patient.

## 2022-03-28 ENCOUNTER — Telehealth: Payer: Self-pay

## 2022-03-28 ENCOUNTER — Telehealth: Payer: Self-pay | Admitting: Medical Oncology

## 2022-03-28 ENCOUNTER — Encounter: Payer: Self-pay | Admitting: Internal Medicine

## 2022-03-28 ENCOUNTER — Telehealth: Payer: Self-pay | Admitting: Internal Medicine

## 2022-03-28 MED ORDER — DEXAMETHASONE 1 MG PO TABS: 3.0000 mg | ORAL_TABLET | Freq: Every day | ORAL | 1 refills | Status: DC

## 2022-03-28 MED ORDER — LEVETIRACETAM 1000 MG PO TABS: 1000.0000 mg | ORAL_TABLET | Freq: Two times a day (BID) | ORAL | 2 refills | Status: DC

## 2022-03-28 NOTE — Progress Notes (Signed)
1/10--Today was a follow up appointment with Dr Julien Nordmann. She is accompanied by her son, Carolyn Hooper. Carolyn Hooper was scheduled to receive her C2 of Carboplatin/Alimta/Keytruda today, however she experienced seizure activity early this morning and was taken to the ED to get this under control. Dr.Mohamed decided to hold her chemotherapy for 1 week and will reassess when she returns. Carolyn Hooper is supposed to have her port placed this Friday 1/12. Carolyn Hooper endorsed tolerating her first cycle of chemotherapy well.  Carolyn Hooper was concerned about her short term disability expiring on 1/31, and needing her long term disability paperwork filled out. I asked the patient to bring the paperwork with her next week and we can address it then. Carolyn Hooper and her son verbalized understanding.

## 2022-03-28 NOTE — Telephone Encounter (Signed)
This nurse returned call to patient related to a message received stating that she needs a refill on Dexmethasone and she also needs a new prescription for her Keppra.  This nurse was not able to speak with the patient and could not leave a message.  Will send a My Chart message.  No further concerns at this time.

## 2022-03-28 NOTE — Progress Notes (Addendum)
On 02/25/22 I met pt for the first time at her Med Onc consult with Dr.Mohamed. She was accompanied by her mother, Lucita Ferrara. Introduced myself and explained my role as Art therapist in the context of her care. Patient is very pleasant. Her biggest concern at the moment was keeping her seizures under control. Dr.Mohamed explained that her neurosurgeon will be the one to manage the medication she is on for her seizures. She also needs to be seen by neurooncology and Cassie Heilingoetter placed the referral for that service. Pt already had a PET scheduled for 12/20. Once the final pathology from the patients biopsy have returned, treatment can be determined. Pt will also be receiving SRS to her brain mets and a consult to RadOnc was placed by Dr.Mohamed.  Another one of the pts main concerns was a critical illness benefit that is offered through her [employer] and does it cover invasive cancer. I explained that I am unfamiliar with that process and I placed a referral to SW to help her with this.  I will continue to follow up with the patient as necessary.

## 2022-03-28 NOTE — Telephone Encounter (Signed)
Son asking if pt can take  OTC organic " [place] tail and lions main mushroom powder."?  He also asked if she can take Liquid IV?  I told him Dr Julien Nordmann does not recommend these supplements as there is not enough research on how it interacts with chemotherapy or non chemo drugs such as her seizure medication. I told her it is ok for Gatorade if she is dehydrated .vomiting , diarrhea.

## 2022-03-28 NOTE — Telephone Encounter (Signed)
Scheduled per 01/10 los, patient has been called and voicemail was left regarding upcoming appointments.

## 2022-03-29 ENCOUNTER — Ambulatory Visit (HOSPITAL_COMMUNITY)
Admission: RE | Admit: 2022-03-29 | Discharge: 2022-03-29 | Disposition: A | Discharge status: Home / Self Care | Payer: BC Managed Care – PPO | Source: Ambulatory Visit | Attending: Internal Medicine | Admitting: Internal Medicine

## 2022-03-29 ENCOUNTER — Other Ambulatory Visit: Payer: Self-pay

## 2022-03-29 ENCOUNTER — Encounter: Payer: Self-pay | Admitting: Medical Oncology

## 2022-03-29 ENCOUNTER — Encounter (HOSPITAL_COMMUNITY): Payer: Self-pay

## 2022-03-29 ENCOUNTER — Other Ambulatory Visit: Payer: Self-pay | Admitting: Internal Medicine

## 2022-03-29 DIAGNOSIS — Z452 Encounter for adjustment and management of vascular access device: Secondary | ICD-10-CM | POA: Diagnosis not present

## 2022-03-29 DIAGNOSIS — C3492 Malignant neoplasm of unspecified part of left bronchus or lung: Secondary | ICD-10-CM | POA: Insufficient documentation

## 2022-03-29 DIAGNOSIS — I10 Essential (primary) hypertension: Secondary | ICD-10-CM | POA: Diagnosis not present

## 2022-03-29 DIAGNOSIS — I878 Other specified disorders of veins: Secondary | ICD-10-CM

## 2022-03-29 DIAGNOSIS — C7931 Secondary malignant neoplasm of brain: Secondary | ICD-10-CM | POA: Insufficient documentation

## 2022-03-29 DIAGNOSIS — C3432 Malignant neoplasm of lower lobe, left bronchus or lung: Secondary | ICD-10-CM | POA: Diagnosis not present

## 2022-03-29 HISTORY — PX: IR IMAGING GUIDED PORT INSERTION: IMG5740

## 2022-03-29 LAB — GLUCOSE, CAPILLARY: Glucose-Capillary: 198 mg/dL — ABNORMAL HIGH (ref 70–99)

## 2022-03-29 MED ORDER — MIDAZOLAM HCL 2 MG/2ML IJ SOLN
INTRAMUSCULAR | Status: AC | PRN
  Administered 2022-03-29: .5 mg via INTRAVENOUS
  Administered 2022-03-29: 1 mg via INTRAVENOUS
  Administered 2022-03-29: .5 mg via INTRAVENOUS

## 2022-03-29 MED ORDER — SODIUM CHLORIDE 0.9 % IV SOLN
INTRAVENOUS | Status: AC | PRN
  Administered 2022-03-29: 10 mL/h via INTRAVENOUS

## 2022-03-29 MED ORDER — MIDAZOLAM HCL 2 MG/2ML IJ SOLN
INTRAMUSCULAR | Status: AC
  Filled 2022-03-29: qty 2

## 2022-03-29 MED ORDER — LIDOCAINE-EPINEPHRINE 1 %-1:100000 IJ SOLN
INTRAMUSCULAR | Status: AC
  Administered 2022-03-29: 15 mL via INTRADERMAL
  Filled 2022-03-29: qty 1

## 2022-03-29 MED ORDER — FENTANYL CITRATE (PF) 100 MCG/2ML IJ SOLN
INTRAMUSCULAR | Status: AC
  Filled 2022-03-29: qty 2

## 2022-03-29 MED ORDER — HEPARIN SOD (PORK) LOCK FLUSH 100 UNIT/ML IV SOLN
INTRAVENOUS | Status: AC
  Administered 2022-03-29: 500 [IU]
  Filled 2022-03-29: qty 5

## 2022-03-29 MED ORDER — FENTANYL CITRATE (PF) 100 MCG/2ML IJ SOLN
INTRAMUSCULAR | Status: AC | PRN
  Administered 2022-03-29 (×2): 25 ug via INTRAVENOUS
  Administered 2022-03-29: 50 ug via INTRAVENOUS

## 2022-03-29 MED ORDER — SODIUM CHLORIDE 0.9 % IV SOLN: INTRAVENOUS | Status: DC

## 2022-03-29 NOTE — Procedures (Signed)
Interventional Radiology Procedure Note  Procedure: Placement of a right IJ approach single lumen PowerPort.  Tip is positioned at the superior cavoatrial junction and catheter is ready for immediate use.  Complications: No immediate Recommendations:  - Ok to shower tomorrow - Do not submerge for 7 days - Routine line care   Signed,  Ayra Hodgdon K. Kaine Mcquillen, MD   

## 2022-03-29 NOTE — H&P (Signed)
Chief Complaint: Chemotherapy access. Request is for portacath placement  Referring Physician(s): Mohamed,Mohamed  Supervising Physician: Malachy Moan  Patient Status: [facility] - Out-pt  History of Present Illness: Carolyn Hooper is a 62 y.o. female outpatient. History of HTN,  metastatic NSCL (left). Team is requesting portacath for chemotherapy access.  Return precautions and treatment recommendations and follow-up discussed with the patient  who is agreeable with the plan.   Past Medical History:  Diagnosis Date   Abscess    hydroadenitis superativa   Anxiety    DM (diabetes mellitus) (HCC)    Hyperinsulinemia    Hypertension 1983   gestational - pre-eclampsia   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[facility])    Neuroma    audiotry neuroma   Seizure (HCC)    Vertigo     Past Surgical History:  Procedure Laterality Date   ABDOMINAL HYSTERECTOMY  2006   remaining left ovary   BRONCHIAL BIOPSY  02/19/2022   Procedure: BRONCHIAL BIOPSIES;  Surgeon: Josephine Igo, DO;  Location: [facility] ENDOSCOPY;  Service: Pulmonary;;   BRONCHIAL BRUSHINGS  02/19/2022   Procedure: BRONCHIAL BRUSHINGS;  Surgeon: Josephine Igo, DO;  Location: [facility] ENDOSCOPY;  Service: Pulmonary;;   BRONCHIAL NEEDLE ASPIRATION BIOPSY  02/19/2022   Procedure: BRONCHIAL NEEDLE ASPIRATION BIOPSIES;  Surgeon: Josephine Igo, DO;  Location: [facility] ENDOSCOPY;  Service: Pulmonary;;   SHOULDER SURGERY Right 1960s    Allergies: Adhesive [tape] and Statins  Medications: Prior to Admission medications   Medication Sig Start Date End Date Taking? Authorizing Provider  clonazePAM (KLONOPIN) 0.5 MG tablet Take 0.5 tablets (0.25 mg total) by mouth at bedtime. For anxiety Patient taking differently: Take 0.25 mg by mouth daily as needed for anxiety (and vertigo). 11/01/21  Yes Hawks, [name] A, FNP  dexamethasone (DECADRON) 1 MG tablet Take 3 tablets (3 mg total) by mouth daily with breakfast. 03/28/22  Yes Vaslow, Georgeanna Lea,  MD  folic acid (FOLVITE) 1 MG tablet Take 1 tablet (1 mg total) by mouth daily. 02/27/22  Yes Heilingoetter, [name], PA-C  levETIRAcetam (KEPPRA) 1000 MG tablet Take 1 tablet (1,000 mg total) by mouth 2 (two) times daily. 03/28/22  Yes Vaslow, Georgeanna Lea, MD  magic mouthwash SOLN Take 5 mLs by mouth 3 (three) times daily as needed for mouth pain. 03/12/22   Heilingoetter, [name], PA-C  metFORMIN (GLUCOPHAGE) 1000 MG tablet Take 1 tablet (1,000 mg total) by mouth 2 (two) times daily. Patient taking differently: Take 500-1,000 mg by mouth See admin instructions. 500 mg or 1000 mg with meals depending on how much carbs and sugars Pt eats 11/01/21  Yes Hawks, [name] A, FNP  metFORMIN (GLUCOPHAGE) 850 MG tablet Take 425-850 mg by mouth See admin instructions. 425 mg - 850 mg with meals, depending on how much carbs and sugars Pt eats   Yes [provider]  ondansetron (ZOFRAN) 4 MG tablet Take 1 tablet (4 mg total) by mouth every 6 (six) hours as needed for nausea. 02/13/22  Yes Danford, Earl Lites, MD  prochlorperazine (COMPAZINE) 10 MG tablet Take 1 tablet (10 mg total) by mouth every 6 (six) hours as needed. 02/25/22  Yes Heilingoetter, [name], PA-C  albuterol (VENTOLIN HFA) 108 (90 Base) MCG/ACT inhaler Inhale 2 puffs into the lungs every 6 (six) hours as needed. 01/08/21   [name], FNP  Aspirin-Acetaminophen-Caffeine (GOODYS EXTRA STRENGTH) [phone] MG PACK Take 1 packet by mouth daily as needed (headaches).    [provider]  blood glucose meter kit and supplies Dispense based on patient and insurance preference. Use up to four times daily as directed. (FOR ICD-10 E10.9, E11.9). 09/21/19   [name], FNP  nystatin (MYCOSTATIN) 100000 UNIT/ML suspension Take 5 mLs (500,000 Units total) by mouth 4 (four) times daily. 03/20/22   Ronny Bacon, PA-C     Family History  Problem Relation Age of Onset   COPD Mother    Heart disease Father     Diabetes Father    Kidney disease Father    Arthritis Father        RA   Liver disease Father     Social History   Socioeconomic History   Marital status: Divorced    Spouse name: Not on file   Number of children: Not on file   Years of education: Not on file   Highest education level: Not on file  Occupational History   Not on file  Tobacco Use   Smoking status: Former    Packs/day: 0.75    Types: Cigarettes    Quit date: 02/11/2022    Years since quitting: 0.1   Smokeless tobacco: Never  Vaping Use   Vaping Use: Never used  Substance and Sexual Activity   Alcohol use: Yes    Comment: very rare   Drug use: No   Sexual activity: Not on file  Other Topics Concern   Not on file  Social History Narrative   Not on file   Social Determinants of Health   Financial Resource Strain: Not on file  Food Insecurity: No Food Insecurity (02/13/2022)   Hunger Vital Sign    Worried About Running Out of Food in the Last Year: Never true    Ran Out of Food in the Last Year: Never true  Transportation Needs: No Transportation Needs (02/13/2022)   PRAPARE - Administrator, Civil Service (Medical): No    Lack of Transportation (Non-Medical): No  Physical Activity: Not on file  Stress: Not on file  Social Connections: Not on file    Review of Systems: A 12 point ROS discussed and pertinent positives are indicated in the HPI above.  All other systems are negative.  Review of Systems  Constitutional:  Negative for fatigue and fever.  HENT:  Negative for congestion.   Respiratory:  Negative for cough and shortness of breath.   Gastrointestinal:  Negative for abdominal pain, diarrhea, nausea and vomiting.    Vital Signs: BP 131/69   Pulse 69   Temp (!) 97 F (36.1 C) (Temporal)   Resp 17   Ht 5' 7.5" (1.715 m)   Wt 245 lb (111.1 kg)   SpO2 95%   BMI 37.81 kg/m     Physical Exam Vitals and nursing note reviewed.  Constitutional:      Appearance: She is  well-developed.  HENT:     Head: Normocephalic and atraumatic.  Eyes:     Conjunctiva/sclera: Conjunctivae normal.  Cardiovascular:     Rate and Rhythm: Normal rate and regular rhythm.     Heart sounds: Normal heart sounds.  Pulmonary:     Effort: Pulmonary effort is normal.     Breath sounds: Normal breath sounds.  Musculoskeletal:        General: Normal range of motion.     Cervical back: Normal range of motion.  Skin:    General: Skin is warm.  Neurological:     Mental Status: She is alert and oriented to  person, place, and time.     Imaging: MR Brain W Wo Contrast  Result Date: 03/19/2022 CLINICAL DATA:  62 year old female with non-small cell lung cancer. History of treated right side vestibular schwannoma. Brain MRI in November revealing bilateral superior frontal lobe brain metastases. Reportedly status post SRS. Subsequent encounter. EXAM: MRI HEAD WITHOUT AND WITH CONTRAST TECHNIQUE: Multiplanar, multiecho pulse sequences of the brain and surrounding structures were obtained without and with intravenous contrast. CONTRAST:  10 mL Vueway COMPARISON:  Brain MRI 02/13/2022 and earlier. FINDINGS: Brain: Thin slice postcontrast imaging today compared to November. Lobulated and enhancing left middle frontal gyrus mass is 20 x 20 x 26 mm (AP by transverse by CC). Visually, this has not significantly changed in size or configuration since November. Regional T2 and FLAIR hyperintense vasogenic edema has regressed but not resolved (series 10, image 44). Mild regional mass effect has regressed. Mild hemosiderin is stable. Contralateral small and round medial right perirolandic 8 mm enhancing metastasis, also visually stable since November and with no residual edema today (series 10, image 49). Small chronic right internal auditory canal enhancing tumor is stable since 2022 (series 13, image 54). No new abnormal enhancement identified. No dural thickening. No superimposed restricted diffusion to  suggest acute infarction. No midline shift, ventriculomegaly, extra-axial collection or acute intracranial hemorrhage. Cervicomedullary junction and pituitary are within normal limits. Outside of the treatment areas gray and white matter signal is stable and within normal limits. Vascular: Major intracranial vascular flow voids are stable. Skull and upper cervical spine: Negative visible cervical spine. Skull bone marrow signal is stable. Sinuses/Orbits: Stable, negative. Other: Mastoids remain clear. IMPRESSION: 1. Stable size of two bilateral superior frontal lobe brain metastases since November. Surrounding edema has regressed at both (resolved on the right). 2. Chronic small right vestibular schwannoma. No new metastatic disease or intracranial abnormality identified. Electronically Signed   By: Genevie Ann M.D.   On: 03/19/2022 09:03   NM PET Image Initial (PI) Skull Base To Thigh (F-18 FDG)  Result Date: 03/07/2022 CLINICAL DATA:  Initial treatment strategy for non-small cell lung cancer. EXAM: NUCLEAR MEDICINE PET SKULL BASE TO THIGH TECHNIQUE: 11.67 mCi F-18 FDG was injected intravenously. Full-ring PET imaging was performed from the skull base to thigh after the radiotracer. CT data was obtained and used for attenuation correction and anatomic localization. Fasting blood glucose: 158 mg/dl COMPARISON:  CT chest, abdomen and pelvis 02/12/2022 FINDINGS: Mediastinal blood pool activity: SUV max 3.12 Liver activity: SUV max NA NECK: No hypermetabolic lymph nodes in the neck. Incidental CT findings: None. CHEST: Tracer avid subpleural mass within the posteromedial left lower lobe measures 5.2 x 4.4 cm with SUV max of 8.92, image 48/7. Multiple bilateral pulmonary nodules are identified concerning for metastatic disease. Index lesions include: -right upper lobe lung nodule measures 0.9 cm with SUV max of 2.38, image 25/7. -left lower lobe lung nodule measures 0.7 cm with SUV max of 7.92, image 36/7. No tracer  avid axillary, mediastinal or hilar lymph nodes. Incidental CT findings: Aortic atherosclerosis and coronary artery calcifications. ABDOMEN/PELVIS: No abnormal hypermetabolic activity within the liver, pancreas, adrenal glands, or spleen. No hypermetabolic lymph nodes in the abdomen or pelvis. Incidental CT findings: Large stone in the gallbladder measures 4 cm, image 130/4. Aortic atherosclerosis. SKELETON: No focal hypermetabolic activity to suggest skeletal metastasis. Incidental CT findings: None. IMPRESSION: 1. Tracer avid left lower lobe lung mass is identified compatible with primary bronchogenic carcinoma. 2. Bilateral tracer avid pulmonary nodules compatible with metastatic  disease. 3. No signs of tracer avid nodal metastasis or distant metastatic disease. 4.  Aortic Atherosclerosis (ICD10-I70.0). Electronically Signed   By: Signa Kell M.D.   On: 03/07/2022 04:43    Labs:  CBC: Recent Labs    02/13/22 0651 02/25/22 1300 03/06/22 1340 03/27/22 1317  WBC 16.4* 19.8* 15.6* 9.0  HGB 15.4* 16.4* 16.8* 14.1  HCT 46.2* 48.7* 48.0* 41.1  PLT 292 253 195 161    COAGS: Recent Labs    02/11/22 2233  INR 0.9  APTT 27    BMP: Recent Labs    02/13/22 0651 02/25/22 1300 03/06/22 1340 03/27/22 1317  NA 136 138 138 136  K 4.0 4.1 4.3 4.2  CL 103 101 103 99  CO2 24 28 29 27   GLUCOSE 247* 186* 211* 315*  BUN 14 23 30* 21  CALCIUM 9.3 10.0 9.5 9.4  CREATININE 0.77 0.76 0.76 0.69  GFRNONAA >60 >60 >60 >60    LIVER FUNCTION TESTS: Recent Labs    02/12/22 0446 02/25/22 1300 03/06/22 1340 03/27/22 1317  BILITOT 0.4 0.5 0.7 0.4  AST 29 24 15 20   ALT 33 68* 49* 78*  ALKPHOS 72 55 47 47  PROT 7.1 6.4* 6.3* 6.0*  ALBUMIN 3.6 3.9 3.7 3.6     Assessment and Plan:  62 y.o. female outpatient. History of HTN,  metastatic NSCL (left). Team is requesting portacath for chemotherapy access.   PET scan from 12.21.223 shows RIJ is accessible. Labs from 1.10.24 unremarkable. All  medications are within acceptable parameters. Allergies include tape.  Risks and benefits of image guided port-a-catheter placement was discussed with the patient including, but not limited to bleeding, infection, pneumothorax, or fibrin sheath development and need for additional procedures.  All of the patient's questions were answered, patient is agreeable to proceed.  Consent signed and in chart.   Thank you for this interesting consult.  I greatly enjoyed meeting ADELLE ZACHAR and look forward to participating in their care.  A copy of this report was sent to the requesting provider on this date.  Electronically Signed: 3.10.24, NP 03/29/2022, 10:35 AM   I spent a total of  30 Minutes   in face to face in clinical consultation, greater than 50% of which was counseling/coordinating care for portacath placement

## 2022-03-29 NOTE — Sedation Documentation (Signed)
Complaining of pressure and some pain

## 2022-03-31 NOTE — Progress Notes (Unsigned)
Carolyn Hooper OFFICE PROGRESS NOTE  Carolyn Hooper, [place] [address]  DIAGNOSIS: Stage IV (T3, N0, M1b) non-small cell lung cancer, adenosquamous carcinoma presented with large medial left lower lobe lung mass with bilateral pulmonary nodules and metastatic disease to the brain diagnosed in November 2023   PRIOR THERAPY: Status post SRS to brain metastasis completed March 26, 2022   CURRENT THERAPY: Systemic chemotherapy with carboplatin for AUC of 5, Alimta 500 Mg/M2 and Keytruda 200 Mg IV every 3 weeks status post 1 cycle. First dose was given on March 06, 2022.   INTERVAL HISTORY: Carolyn Hooper 62 y.o. Hooper returns to the clinic today for a follow-up visit accompanied by her mother.  The patient was last seen in the clinic last week by Dr. Julien Nordmann on 03/27/2022.  At that point time, the patient had recently experienced a seizure and was in the emergency room.  Dr. Mickeal Skinner who increased her Keppra and Decadron.  Dr. Julien Nordmann delayed cycle #2 until she had improvement in her condition as she had fatigue and generalized weakness.  Since last being seen, the patient states she is doing fairly well.  Although, she states that she had her brain radiation and her seizure that her speech "sounds like a toddler".  Her speech is slow and takes her time to think about what she is going to say. She finds this very frustrating and is wondering what she can do to improve this.  Since last week, she thinks she may have had another episode that lasted "nanoseconds" where her speech was gibberish for a second.  She thinks this occurred around 03/28/22 she has a 1 month follow-up phone call with radiation oncology in 05/06/2022.   She denies any fever, chills, night sweats.  She denies any weight loss.  She denies any chest pain, shortness of breath, or hemoptysis.  She sometimes may have a mild dry cough to clear her throat.  She may intermittently denies any nausea,  vomiting, diarrhea, or constipation.  Denies any headache or visual changes.  She tolerated her first cycle of chemo well except for thrush for which she needs a refill of nystatin.  Is also meeting with a financial advocate for her FMLA.  She is here today for evaluation repeat blood work before undergoing cycle #2.   MEDICAL HISTORY: Past Medical History:  Diagnosis Date   Abscess    hydroadenitis superativa   Anxiety    DM (diabetes mellitus) ([facility])    Hyperinsulinemia    Hypertension 1983   gestational - pre-eclampsia   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[facility])    Neuroma    audiotry neuroma   Seizure ([place])    Vertigo     ALLERGIES:  is allergic to adhesive [tape] and statins.  MEDICATIONS:  Current Outpatient Medications  Medication Sig Dispense Refill   magic mouthwash SOLN Take 5 mLs by mouth 3 (three) times daily as needed for mouth pain. 240 mL 0   albuterol (VENTOLIN HFA) 108 (90 Base) MCG/ACT inhaler Inhale 2 puffs into the lungs every 6 (six) hours as needed. 18 g 2   Aspirin-Acetaminophen-Caffeine (GOODYS EXTRA STRENGTH) 500-325-65 MG PACK Take 1 packet by mouth daily as needed (headaches).     blood glucose meter kit and supplies Dispense based on patient and insurance preference. Use up to four times daily as directed. (FOR ICD-10 E10.9, E11.9). 1 each 0   clonazePAM (KLONOPIN) 0.5 MG tablet Take 0.5 tablets (  0.25 mg total) by mouth at bedtime. For anxiety (Patient taking differently: Take 0.25 mg by mouth daily as needed for anxiety (and vertigo).) 30 tablet 2   dexamethasone (DECADRON) 1 MG tablet Take 3 tablets (3 mg total) by mouth daily with breakfast. 90 tablet 1   folic acid (FOLVITE) 1 MG tablet Take 1 tablet (1 mg total) by mouth daily. 30 tablet 2   levETIRAcetam (KEPPRA) 1000 MG tablet Take 1 tablet (1,000 mg total) by mouth 2 (two) times daily. 60 tablet 2   metFORMIN (GLUCOPHAGE) 1000 MG tablet Take 1 tablet (1,000 mg total) by mouth 2 (two) times daily.  (Patient taking differently: Take 500-1,000 mg by mouth See admin instructions. 500 mg or 1000 mg with meals depending on how much carbs and sugars Pt eats) 180 tablet 1   metFORMIN (GLUCOPHAGE) 850 MG tablet Take 425-850 mg by mouth See admin instructions. 425 mg - 850 mg with meals, depending on how much carbs and sugars Pt eats     nystatin (MYCOSTATIN) 100000 UNIT/ML suspension Take 5 mLs (500,000 Units total) by mouth 4 (four) times daily. 60 mL 0   ondansetron (ZOFRAN) 4 MG tablet Take 1 tablet (4 mg total) by mouth every 6 (six) hours as needed for nausea. 20 tablet 0   prochlorperazine (COMPAZINE) 10 MG tablet Take 1 tablet (10 mg total) by mouth every 6 (six) hours as needed. 30 tablet 2   No current facility-administered medications for this visit.   Facility-Administered Medications Ordered in Other Visits  Medication Dose Route Frequency Provider Last Rate Last Admin   0.9 %  sodium chloride infusion   Intravenous Once [name], MD       CARBOplatin (PARAPLATIN) 750 mg in sodium chloride 0.9 % 250 mL chemo infusion  750 mg Intravenous Once [name], MD       dexamethasone (DECADRON) 10 mg in sodium chloride 0.9 % 50 mL IVPB  10 mg Intravenous Once [name], MD       fosaprepitant (EMEND) 150 mg in sodium chloride 0.9 % 145 mL IVPB  150 mg Intravenous Once [name], MD       heparin lock flush 100 unit/mL  500 Units Intracatheter Once PRN [name], MD       palonosetron (ALOXI) injection 0.25 mg  0.25 mg Intravenous Once [name], MD       pembrolizumab [facility]) 200 mg in sodium chloride 0.9 % 50 mL chemo infusion  200 mg Intravenous Once [name], MD       PEMEtrexed (ALIMTA) 1,100 mg in sodium chloride 0.9 % 100 mL chemo infusion  500 mg/m2 (Treatment Plan Recorded) Intravenous Once [name], MD       sodium chloride flush (NS) 0.9 % injection 10 mL  10 mL Intracatheter PRN [name], MD        SURGICAL  HISTORY:  Past Surgical History:  Procedure Laterality Date   ABDOMINAL HYSTERECTOMY  2006   remaining left ovary   BRONCHIAL BIOPSY  02/19/2022   Procedure: BRONCHIAL BIOPSIES;  Surgeon: Josephine Igo, DO;  Location: [facility];  Service: Pulmonary;;   BRONCHIAL BRUSHINGS  02/19/2022   Procedure: BRONCHIAL BRUSHINGS;  Surgeon: Josephine Igo, DO;  Location: [facility];  Service: Pulmonary;;   BRONCHIAL NEEDLE ASPIRATION BIOPSY  02/19/2022   Procedure: BRONCHIAL NEEDLE ASPIRATION BIOPSIES;  Surgeon: Josephine Igo, DO;  Location: [facility];  Service: Pulmonary;;   IR IMAGING GUIDED PORT INSERTION  03/29/2022  SHOULDER SURGERY Right 1960s    REVIEW OF SYSTEMS:   Review of Systems  Constitutional: Negative for appetite change, chills, fatigue, and fever.  HENT: Negative for mouth sores, nosebleeds, sore throat and trouble swallowing.   Eyes: Negative for eye problems and icterus.  Respiratory: Positive for mild cough.  Negative for hemoptysis, shortness of breath and wheezing.   Cardiovascular: Negative for chest pain and leg swelling.  Gastrointestinal: Negative for abdominal pain, constipation, diarrhea, nausea and vomiting.  Genitourinary: Negative for bladder incontinence, difficulty urinating, dysuria, frequency and hematuria.   Musculoskeletal: Negative for back pain, gait problem, neck pain and neck stiffness.  Skin: Negative for itching and rash.  Neurological: Negative for dizziness, extremity weakness, gait problem, headaches, light-headedness and seizures.  Hematological: Negative for adenopathy. Does not bruise/bleed easily.  Psychiatric/Behavioral: Positive for speech disturbance. Negative for confusion, depression and sleep disturbance. The patient is not nervous/anxious.    PHYSICAL EXAMINATION:  Blood pressure (!) 142/70, pulse 65, temperature 97.6 F (36.4 C), temperature source Oral, resp. rate 16, weight 244 lb 12.8 oz (111 kg), SpO2 98 %.  ECOG  PERFORMANCE STATUS: 1  Physical Exam  Constitutional: Oriented to person, place, and time and well-developed, well-nourished, and in no distress. No distress.  HENT:  Head: Normocephalic and atraumatic.  Mouth/Throat: Oropharynx is clear and moist. No oropharyngeal exudate.  Eyes: Conjunctivae are normal. Right eye exhibits no discharge. Left eye exhibits no discharge. No scleral icterus.  Neck: Normal range of motion. Neck supple.  Cardiovascular: Normal rate, regular rhythm, normal heart sounds and intact distal pulses.   Pulmonary/Chest: Effort normal and breath sounds normal. No respiratory distress. No wheezes. No rales.  Abdominal: Soft. Bowel sounds are normal. Exhibits no distension and no mass. There is no tenderness.  Musculoskeletal: Normal range of motion. Exhibits no edema.  Lymphadenopathy:    No cervical adenopathy.  Neurological: Alert and oriented to person, place, and time. Exhibits normal muscle tone. Gait normal. Coordination normal.  Skin: Skin is warm and dry. No rash noted. Not diaphoretic. No erythema. No pallor.  Psychiatric: Positive for slow speech and trouble with word finding.  Mood, memory and judgment normal.  Vitals reviewed.  LABORATORY DATA: Lab Results  Component Value Date   WBC 12.2 (H) 04/02/2022   HGB 14.2 04/02/2022   HCT 41.1 04/02/2022   MCV 86.5 04/02/2022   PLT 189 04/02/2022      Chemistry      Component Value Date/Time   NA 138 04/02/2022 0748   NA 141 11/01/2021 1009   K 4.2 04/02/2022 0748   CL 102 04/02/2022 0748   CO2 27 04/02/2022 0748   BUN 26 (H) 04/02/2022 0748   BUN 13 11/01/2021 1009   CREATININE 0.63 04/02/2022 0748      Component Value Date/Time   CALCIUM 9.3 04/02/2022 0748   ALKPHOS 49 04/02/2022 0748   AST 15 04/02/2022 0748   ALT 60 (H) 04/02/2022 0748   BILITOT 0.6 04/02/2022 0748       RADIOGRAPHIC STUDIES:  IR IMAGING GUIDED PORT INSERTION  Result Date: 03/29/2022 INDICATION: 62 year old Hooper  with left lower lobe non-small cell lung cancer. She presents for port catheter placement prior to chemotherapy. EXAM: IMPLANTED PORT A CATH PLACEMENT WITH ULTRASOUND AND FLUOROSCOPIC GUIDANCE MEDICATIONS: None. ANESTHESIA/SEDATION: Versed 2 mg IV; Fentanyl 100 mcg IV; Moderate Sedation Time:  15 minutes The patient's vital signs and level of consciousness were continuously monitored during the procedure by the interventional radiology nurse under my direct  supervision. FLUOROSCOPY: Radiation exposure index: COMPLICATIONS: None immediate. PROCEDURE: The right neck and chest was prepped with chlorhexidine, and draped in the usual sterile fashion using maximum barrier technique (cap and mask, sterile gown, sterile gloves, large sterile sheet, hand hygiene and cutaneous antiseptic). Local anesthesia was attained by infiltration with 1% lidocaine with epinephrine. Ultrasound demonstrated patency of the right internal jugular vein, and this was documented with an image. Under real-time ultrasound guidance, this vein was accessed with a 21 gauge micropuncture needle and image documentation was performed. A small dermatotomy was made at the access site with an 11 scalpel. A 0.018" wire was advanced into the SVC and the access needle exchanged for a 28F micropuncture vascular sheath. The 0.018" wire was then removed and a 0.035" wire advanced into the IVC. An appropriate location for the subcutaneous reservoir was selected below the clavicle and an incision was made through the skin and underlying soft tissues. The subcutaneous tissues were then dissected using a combination of blunt and sharp surgical technique and a pocket was formed. A single lumen power injectable portacatheter was then tunneled through the subcutaneous tissues from the pocket to the dermatotomy and the port reservoir placed within the subcutaneous pocket. The venous access site was then serially dilated and a peel away vascular sheath placed over the  wire. The wire was removed and the port catheter advanced into position under fluoroscopic guidance. The catheter tip is positioned in the superior cavoatrial junction. This was documented with a spot image. The portacatheter was then tested and found to flush and aspirate well. The port was flushed with saline followed by 100 units/mL heparinized saline. The pocket was then closed in two layers using first subdermal inverted interrupted absorbable sutures followed by a running subcuticular suture. The epidermis was then sealed with Dermabond. The dermatotomy at the venous access site was also closed with Dermabond. IMPRESSION: Successful placement of a right IJ approach Power Port with ultrasound and fluoroscopic guidance. The catheter is ready for use. Electronically Signed   By: Jacqulynn Cadet M.D.   On: 03/29/2022 15:28   MR Brain W Wo Contrast  Result Date: 03/19/2022 CLINICAL DATA:  62 year old Hooper with non-small cell lung cancer. History of treated right side vestibular schwannoma. Brain MRI in November revealing bilateral superior frontal lobe brain metastases. Reportedly status post SRS. Subsequent encounter. EXAM: MRI HEAD WITHOUT AND WITH CONTRAST TECHNIQUE: Multiplanar, multiecho pulse sequences of the brain and surrounding structures were obtained without and with intravenous contrast. CONTRAST:  10 mL Vueway COMPARISON:  Brain MRI 02/13/2022 and earlier. FINDINGS: Brain: Thin slice postcontrast imaging today compared to November. Lobulated and enhancing left middle frontal gyrus mass is 20 x 20 x 26 mm (AP by transverse by CC). Visually, this has not significantly changed in size or configuration since November. Regional T2 and FLAIR hyperintense vasogenic edema has regressed but not resolved (series 10, image 44). Mild regional mass effect has regressed. Mild hemosiderin is stable. Contralateral small and round medial right perirolandic 8 mm enhancing metastasis, also visually stable since  November and with no residual edema today (series 10, image 49). Small chronic right internal auditory canal enhancing tumor is stable since 2022 (series 13, image 54). No new abnormal enhancement identified. No dural thickening. No superimposed restricted diffusion to suggest acute infarction. No midline shift, ventriculomegaly, extra-axial collection or acute intracranial hemorrhage. Cervicomedullary junction and pituitary are within normal limits. Outside of the treatment areas gray and white matter signal is stable and within normal limits.  Vascular: Major intracranial vascular flow voids are stable. Skull and upper cervical spine: Negative visible cervical spine. Skull bone marrow signal is stable. Sinuses/Orbits: Stable, negative. Other: Mastoids remain clear. IMPRESSION: 1. Stable size of two bilateral superior frontal lobe brain metastases since November. Surrounding edema has regressed at both (resolved on the right). 2. Chronic small right vestibular schwannoma. No new metastatic disease or intracranial abnormality identified. Electronically Signed   By: Genevie Ann M.D.   On: 03/19/2022 09:03   NM PET Image Initial (PI) Skull Base To Thigh (F-18 FDG)  Result Date: 03/07/2022 CLINICAL DATA:  Initial treatment strategy for non-small cell lung cancer. EXAM: NUCLEAR MEDICINE PET SKULL BASE TO THIGH TECHNIQUE: 11.67 mCi F-18 FDG was injected intravenously. Full-ring PET imaging was performed from the skull base to thigh after the radiotracer. CT data was obtained and used for attenuation correction and anatomic localization. Fasting blood glucose: 158 mg/dl COMPARISON:  CT chest, abdomen and pelvis 02/12/2022 FINDINGS: Mediastinal blood pool activity: SUV max 3.12 Liver activity: SUV max NA NECK: No hypermetabolic lymph nodes in the neck. Incidental CT findings: None. CHEST: Tracer avid subpleural mass within the posteromedial left lower lobe measures 5.2 x 4.4 cm with SUV max of 8.92, image 48/7. Multiple  bilateral pulmonary nodules are identified concerning for metastatic disease. Index lesions include: -right upper lobe lung nodule measures 0.9 cm with SUV max of 2.38, image 25/7. -left lower lobe lung nodule measures 0.7 cm with SUV max of 7.92, image 36/7. No tracer avid axillary, mediastinal or hilar lymph nodes. Incidental CT findings: Aortic atherosclerosis and coronary artery calcifications. ABDOMEN/PELVIS: No abnormal hypermetabolic activity within the liver, pancreas, adrenal glands, or spleen. No hypermetabolic lymph nodes in the abdomen or pelvis. Incidental CT findings: Large stone in the gallbladder measures 4 cm, image 130/4. Aortic atherosclerosis. SKELETON: No focal hypermetabolic activity to suggest skeletal metastasis. Incidental CT findings: None. IMPRESSION: 1. Tracer avid left lower lobe lung mass is identified compatible with primary bronchogenic carcinoma. 2. Bilateral tracer avid pulmonary nodules compatible with metastatic disease. 3. No signs of tracer avid nodal metastasis or distant metastatic disease. 4.  Aortic Atherosclerosis (ICD10-I70.0). Electronically Signed   By: Kerby Moors M.D.   On: 03/07/2022 04:43     ASSESSMENT/PLAN:  This is a very pleasant 62 year old Caucasian Hooper diagnosed with stage IV (T3, N0, M1 B) non-small cell lung cancer, adenosquamous carcinoma.  She presented with a large medial left lower lobe lung mass with bilateral pulmonary nodules metastatic disease to the brain.  She was diagnosed in November 2023.  She completed SRS to the metastatic brain lesions on 03/26/2022.  The patient had developed seizures and was seen in the emergency room.  Her dose of Keppra was increased and she was also put on higher dose of Decadron.  She underwent 1 cycle of systemic chemotherapy and immunotherapy with carboplatin for an AUC of 5, Alimta 500 mg/m, Keytruda 20 mg IV every 3 weeks.  She is status post 1 cycle.  She tolerated well without any concerning adverse  side effects.  The patient was seen with Dr. Julien Nordmann today.  Labs reviewed.  Recommend that she proceed with cycle #2 today scheduled.  We will see her back for follow-up visit in 3 weeks for evaluation repeat blood work before undergoing cycle #3.  Will arrange for weekly labs.  I have refilled her nystatin.  She will continue to follow with radiation oncology and neuro oncology regarding her Keppra and Decadron taper.  The patient  is wondering what she can do to improve her speech.  I will reach out to Dr. Barbaraann Cao to see if she needs a follow-up or has any additional recommendations.  I will see what he thinks about speech therapy.  Patient left some of the documents she required for her FMLA at home.  She will bring them next time to meet with financial advocate.  Patient has allergy to adhesive and has some erythema near where her tape was for her Port-A-Cath placement.  It does not appear infected.  The patient was advised to call immediately if she has any concerning symptoms in the interval. The patient voices understanding of current disease status and treatment options and is in agreement with the current care plan. All questions were answered. The patient knows to call the clinic with any problems, questions or concerns. We can certainly see the patient much sooner if necessary      Orders Placed This Encounter  Procedures   CBC with Differential (Cancer Center Only)    Standing Status:   Standing    Number of Occurrences:   12    Standing Expiration Date:   04/03/2023   CMP (Cancer Center only)    Standing Status:   Standing    Number of Occurrences:   12    Standing Expiration Date:   04/03/2023     Johnette Abraham Neysa Arts, PA-C 04/02/22  ADDENDUM: Hematology/Oncology Attending: I had a face-to-face encounter with the patient today.  I reviewed her records, lab and recommended her care plan.  This is a very pleasant 62 years old white Hooper with Stage IV (T3, N0,  M1b) non-small cell lung cancer, adenosquamous carcinoma presented with large medial left lower lobe lung mass with bilateral pulmonary nodules and metastatic disease to the brain diagnosed in November 2023.  She is status post SRS to brain metastasis completed March 26, 2022. The patient is currently undergoing systemic chemotherapy with carboplatin, Alimta and Keytruda status post 1 cycle.  She was supposed to start cycle #2 of her treatment last week but the patient was seen at the emergency department the night before with recurrent seizure and she was loaded with Keppra at a very high dose and monitored by Dr. Barbaraann Cao.  She was not feeling well at that time.  Her treatment was delayed until today and she is feeling much better but continues to have slow speech. I recommended for the patient to proceed with cycle #2 today as planned. She had a Port-A-Cath placed few days ago. The patient will come back for follow-up visit in 3 weeks for evaluation and management of any adverse effect of her treatment. She was advised to call immediately if she has any other concerning symptoms in the interval.  The total time spent in the appointment was 30 minutes. Disclaimer: This note was dictated with voice recognition software. Similar sounding words can inadvertently be transcribed and may be missed upon review. Lajuana Matte, MD

## 2022-04-01 ENCOUNTER — Telehealth: Payer: Self-pay | Admitting: Medical Oncology

## 2022-04-01 MED FILL — Dexamethasone Sodium Phosphate Inj 100 MG/10ML: INTRAMUSCULAR | Qty: 1 | Status: AC

## 2022-04-01 MED FILL — Fosaprepitant Dimeglumine For IV Infusion 150 MG (Base Eq): INTRAVENOUS | Qty: 5 | Status: AC

## 2022-04-01 NOTE — Telephone Encounter (Signed)
Port placed Friday . The port site is covered with a transparent dressing. Site itching intermittently. I told her the nurse will assess the site tomorrow and remove dressing. Pt allergic to adhesives.

## 2022-04-02 ENCOUNTER — Inpatient Hospital Stay: Payer: BC Managed Care – PPO

## 2022-04-02 ENCOUNTER — Inpatient Hospital Stay: Payer: BC Managed Care – PPO | Admitting: Physician Assistant

## 2022-04-02 ENCOUNTER — Other Ambulatory Visit: Payer: Self-pay

## 2022-04-02 ENCOUNTER — Other Ambulatory Visit: Payer: Self-pay | Admitting: Internal Medicine

## 2022-04-02 ENCOUNTER — Other Ambulatory Visit: Payer: BC Managed Care – PPO

## 2022-04-02 ENCOUNTER — Telehealth: Payer: Self-pay

## 2022-04-02 VITALS — BP 142/70 | HR 65 | Temp 97.6°F | Resp 16 | Wt 244.8 lb

## 2022-04-02 VITALS — BP 120/62 | HR 68 | Temp 97.8°F | Resp 18

## 2022-04-02 DIAGNOSIS — Z90721 Acquired absence of ovaries, unilateral: Secondary | ICD-10-CM | POA: Insufficient documentation

## 2022-04-02 DIAGNOSIS — E119 Type 2 diabetes mellitus without complications: Secondary | ICD-10-CM | POA: Diagnosis not present

## 2022-04-02 DIAGNOSIS — C3492 Malignant neoplasm of unspecified part of left bronchus or lung: Secondary | ICD-10-CM

## 2022-04-02 DIAGNOSIS — Z9079 Acquired absence of other genital organ(s): Secondary | ICD-10-CM | POA: Diagnosis not present

## 2022-04-02 DIAGNOSIS — Z7984 Long term (current) use of oral hypoglycemic drugs: Secondary | ICD-10-CM | POA: Diagnosis not present

## 2022-04-02 DIAGNOSIS — C3432 Malignant neoplasm of lower lobe, left bronchus or lung: Secondary | ICD-10-CM | POA: Diagnosis not present

## 2022-04-02 DIAGNOSIS — C3490 Malignant neoplasm of unspecified part of unspecified bronchus or lung: Secondary | ICD-10-CM

## 2022-04-02 DIAGNOSIS — Z5111 Encounter for antineoplastic chemotherapy: Secondary | ICD-10-CM | POA: Diagnosis not present

## 2022-04-02 DIAGNOSIS — C7931 Secondary malignant neoplasm of brain: Secondary | ICD-10-CM

## 2022-04-02 DIAGNOSIS — Z5112 Encounter for antineoplastic immunotherapy: Secondary | ICD-10-CM | POA: Diagnosis not present

## 2022-04-02 DIAGNOSIS — Z7952 Long term (current) use of systemic steroids: Secondary | ICD-10-CM | POA: Insufficient documentation

## 2022-04-02 DIAGNOSIS — Z79899 Other long term (current) drug therapy: Secondary | ICD-10-CM | POA: Insufficient documentation

## 2022-04-02 DIAGNOSIS — Z7189 Other specified counseling: Secondary | ICD-10-CM | POA: Diagnosis not present

## 2022-04-02 DIAGNOSIS — Z95828 Presence of other vascular implants and grafts: Secondary | ICD-10-CM

## 2022-04-02 DIAGNOSIS — Z90710 Acquired absence of both cervix and uterus: Secondary | ICD-10-CM | POA: Insufficient documentation

## 2022-04-02 LAB — CMP (CANCER CENTER ONLY)
ALT: 60 U/L — ABNORMAL HIGH (ref 0–44)
AST: 15 U/L (ref 15–41)
Albumin: 3.4 g/dL — ABNORMAL LOW (ref 3.5–5.0)
Alkaline Phosphatase: 49 U/L (ref 38–126)
Anion gap: 9 (ref 5–15)
BUN: 26 mg/dL — ABNORMAL HIGH (ref 8–23)
CO2: 27 mmol/L (ref 22–32)
Calcium: 9.3 mg/dL (ref 8.9–10.3)
Chloride: 102 mmol/L (ref 98–111)
Creatinine: 0.63 mg/dL (ref 0.44–1.00)
GFR, Estimated: >60 mL/min (ref >60)
Glucose, Bld: 203 mg/dL — ABNORMAL HIGH (ref 70–99)
Potassium: 4.2 mmol/L (ref 3.5–5.1)
Sodium: 138 mmol/L (ref 135–145)
Total Bilirubin: 0.6 mg/dL (ref 0.3–1.2)
Total Protein: 6.1 g/dL — ABNORMAL LOW (ref 6.5–8.1)

## 2022-04-02 LAB — CBC WITH DIFFERENTIAL (CANCER CENTER ONLY)
Abs Immature Granulocytes: 0.31 10*3/uL — ABNORMAL HIGH (ref 0.00–0.07)
Basophils Absolute: 0.1 10*3/uL (ref 0.0–0.1)
Basophils Relative: 1 %
Eosinophils Absolute: 0 10*3/uL (ref 0.0–0.5)
Eosinophils Relative: 0 %
HCT: 41.1 % (ref 36.0–46.0)
Hemoglobin: 14.2 g/dL (ref 12.0–15.0)
Immature Granulocytes: 3 %
Lymphocytes Relative: 19 %
Lymphs Abs: 2.3 10*3/uL (ref 0.7–4.0)
MCH: 29.9 pg (ref 26.0–34.0)
MCHC: 34.5 g/dL (ref 30.0–36.0)
MCV: 86.5 fL (ref 80.0–100.0)
Monocytes Absolute: 0.8 10*3/uL (ref 0.1–1.0)
Monocytes Relative: 7 %
Neutro Abs: 8.7 10*3/uL — ABNORMAL HIGH (ref 1.7–7.7)
Neutrophils Relative %: 70 %
Platelet Count: 189 10*3/uL (ref 150–400)
RBC: 4.75 MIL/uL (ref 3.87–5.11)
RDW: 16.4 % — ABNORMAL HIGH (ref 11.5–15.5)
WBC Count: 12.2 10*3/uL — ABNORMAL HIGH (ref 4.0–10.5)
nRBC: 0.2 % (ref 0.0–0.2)

## 2022-04-02 MED ORDER — HEPARIN SOD (PORK) LOCK FLUSH 100 UNIT/ML IV SOLN: 500.0000 [IU] | Freq: Once | INTRAVENOUS | Status: DC | PRN

## 2022-04-02 MED ORDER — SODIUM CHLORIDE 0.9 % IV SOLN: Freq: Once | INTRAVENOUS | Status: AC

## 2022-04-02 MED ORDER — SODIUM CHLORIDE 0.9 % IV SOLN
10.0000 mg | Freq: Once | INTRAVENOUS | Status: AC
  Administered 2022-04-02: 10 mg via INTRAVENOUS
  Filled 2022-04-02: qty 1
  Filled 2022-04-02: qty 10

## 2022-04-02 MED ORDER — PALONOSETRON HCL INJECTION 0.25 MG/5ML
0.2500 mg | Freq: Once | INTRAVENOUS | Status: AC
  Administered 2022-04-02: 0.25 mg via INTRAVENOUS
  Filled 2022-04-02: qty 5

## 2022-04-02 MED ORDER — NYSTATIN 100000 UNIT/ML MT SUSP: 5.0000 mL | Freq: Four times a day (QID) | OROMUCOSAL | 0 refills | Status: DC

## 2022-04-02 MED ORDER — SODIUM CHLORIDE 0.9% FLUSH
10.0000 mL | INTRAVENOUS | Status: AC | PRN
  Administered 2022-04-02: 10 mL

## 2022-04-02 MED ORDER — SODIUM CHLORIDE 0.9 % IV SOLN
200.0000 mg | Freq: Once | INTRAVENOUS | Status: AC
  Administered 2022-04-02: 200 mg via INTRAVENOUS
  Filled 2022-04-02: qty 8

## 2022-04-02 MED ORDER — SODIUM CHLORIDE 0.9 % IV SOLN
500.0000 mg/m2 | Freq: Once | INTRAVENOUS | Status: AC
  Administered 2022-04-02: 1100 mg via INTRAVENOUS
  Filled 2022-04-02: qty 40

## 2022-04-02 MED ORDER — SODIUM CHLORIDE 0.9 % IV SOLN
150.0000 mg | Freq: Once | INTRAVENOUS | Status: AC
  Administered 2022-04-02: 150 mg via INTRAVENOUS
  Filled 2022-04-02: qty 5
  Filled 2022-04-02: qty 150

## 2022-04-02 MED ORDER — SODIUM CHLORIDE 0.9% FLUSH: 10.0000 mL | INTRAVENOUS | Status: DC | PRN

## 2022-04-02 MED ORDER — SODIUM CHLORIDE 0.9 % IV SOLN
739.0000 mg | Freq: Once | INTRAVENOUS | Status: AC
  Administered 2022-04-02: 750 mg via INTRAVENOUS
  Filled 2022-04-02: qty 75

## 2022-04-02 NOTE — Telephone Encounter (Signed)
Patient called and notified that Dr. Barbaraann Cao wanted her to have follow-up scans in two months time. Following the scan, she would be scheduled for a follow-up appointment with MD to review results.  Patient appreciative of the call and knows that a member of the team will contact her to get her scheduled.

## 2022-04-02 NOTE — Patient Instructions (Signed)
[facility]  Discharge Instructions: Thank you for choosing [facility] to provide your oncology and hematology care.   If you have a lab appointment with the Cancer Center, please go directly to the Cancer Center and check in at the registration area.   Wear comfortable clothing and clothing appropriate for easy access to any Portacath or PICC line.   We strive to give you quality time with your provider. You may need to reschedule your appointment if you arrive late (15 or more minutes).  Arriving late affects you and other patients whose appointments are after yours.  Also, if you miss three or more appointments without notifying the office, you may be dismissed from the clinic at the provider's discretion.      For prescription refill requests, have your pharmacy contact our office and allow 72 hours for refills to be completed.    Today you received the following chemotherapy and/or immunotherapy agents: Pembrolizumab (Keytruda), Pemetrexed (Alimta), and Carboplatin.   To help prevent nausea and vomiting after your treatment, we encourage you to take your nausea medication as directed.  BELOW ARE SYMPTOMS THAT SHOULD BE REPORTED IMMEDIATELY: *FEVER GREATER THAN 100.4 F (38 C) OR HIGHER *CHILLS OR SWEATING *NAUSEA AND VOMITING THAT IS NOT CONTROLLED WITH YOUR NAUSEA MEDICATION *UNUSUAL SHORTNESS OF BREATH *UNUSUAL BRUISING OR BLEEDING *URINARY PROBLEMS (pain or burning when urinating, or frequent urination) *BOWEL PROBLEMS (unusual diarrhea, constipation, pain near the anus) TENDERNESS IN MOUTH AND THROAT WITH OR WITHOUT PRESENCE OF ULCERS (sore throat, sores in mouth, or a toothache) UNUSUAL RASH, SWELLING OR PAIN  UNUSUAL VAGINAL DISCHARGE OR ITCHING   Items with * indicate a potential emergency and should be followed up as soon as possible or go to the Emergency Department if any problems should occur.  Please show the CHEMOTHERAPY  ALERT CARD or IMMUNOTHERAPY ALERT CARD at check-in to the Emergency Department and triage nurse.  Should you have questions after your visit or need to cancel or reschedule your appointment, please contact [facility]  Dept: [phone]  and follow the prompts.  Office hours are 8:00 a.m. to 4:30 p.m. Monday - Friday. Please note that voicemails left after 4:00 p.m. may not be returned until the following business day.  We are closed weekends and major holidays. You have access to a nurse at all times for urgent questions. Please call the main number to the clinic Dept: [phone] and follow the prompts.   For any non-urgent questions, you may also contact your provider using MyChart. We now offer e-Visits for anyone 38 and older to request care online for non-urgent symptoms. For details visit [email or web address].   Also download the MyChart app! Go to the app store, search "MyChart", open the app, select [place], and log in with your MyChart username and password.

## 2022-04-03 ENCOUNTER — Other Ambulatory Visit: Payer: Self-pay | Admitting: Radiation Therapy

## 2022-04-03 ENCOUNTER — Other Ambulatory Visit: Payer: Self-pay

## 2022-04-04 ENCOUNTER — Telehealth: Payer: Self-pay | Admitting: *Deleted

## 2022-04-04 ENCOUNTER — Telehealth: Payer: Self-pay | Admitting: Medical Oncology

## 2022-04-04 ENCOUNTER — Telehealth: Payer: Self-pay | Admitting: Internal Medicine

## 2022-04-04 DIAGNOSIS — C3490 Malignant neoplasm of unspecified part of unspecified bronchus or lung: Secondary | ICD-10-CM

## 2022-04-04 NOTE — Addendum Note (Signed)
Addended by: Charisse Klinefelter on: 04/04/2022 03:04 PM   Modules accepted: Orders

## 2022-04-04 NOTE — Telephone Encounter (Signed)
Scheduled per 01/16 los, patient has been called and notified of upcoming appointments.

## 2022-04-04 NOTE — Telephone Encounter (Signed)
Returned call re showering with new port a cath.-line busy x 2

## 2022-04-04 NOTE — Telephone Encounter (Signed)
Patient called to question if she should continue Decadron 3 mg once daily.  Her Keppra was increased and she was restarted on Decadron post SRS that resulted in several episodes of seizures.  She denies having any further seizures since 03/27/2022 when the changes in medication occurred.    She also request Speech Therapy referral and Occupational Therapy as handwriting and speech have gotten worse since the radiation and seizures.      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  Would PT also be recommended.  Routing to Dr Barbaraann Cao.

## 2022-04-04 NOTE — Telephone Encounter (Signed)
Spoke with patient and communicated instructions per Dr Barbaraann Cao.

## 2022-04-08 ENCOUNTER — Other Ambulatory Visit: Payer: Self-pay | Admitting: *Deleted

## 2022-04-08 ENCOUNTER — Telehealth: Payer: Self-pay | Admitting: Medical Oncology

## 2022-04-08 ENCOUNTER — Other Ambulatory Visit: Payer: Self-pay

## 2022-04-08 DIAGNOSIS — K1379 Other lesions of oral mucosa: Secondary | ICD-10-CM

## 2022-04-08 DIAGNOSIS — C3490 Malignant neoplasm of unspecified part of unspecified bronchus or lung: Secondary | ICD-10-CM

## 2022-04-08 MED ORDER — NYSTATIN 100000 UNIT/ML MT SUSP: 5.0000 mL | Freq: Four times a day (QID) | OROMUCOSAL | 0 refills | Status: DC | PRN

## 2022-04-08 NOTE — Telephone Encounter (Addendum)
Asking for refill for nystatin. She describes mouth sores . Refilled per Cassie.   Sniffles. , sneezing , no fever. I instructed her to moniter symptoms now and call for worsening symptoms and /or fever 100.5 or higher.

## 2022-04-08 NOTE — Progress Notes (Signed)
Added order for speech therapy

## 2022-04-09 ENCOUNTER — Inpatient Hospital Stay: Payer: BC Managed Care – PPO

## 2022-04-09 ENCOUNTER — Other Ambulatory Visit: Payer: Self-pay

## 2022-04-09 DIAGNOSIS — Z5112 Encounter for antineoplastic immunotherapy: Secondary | ICD-10-CM | POA: Diagnosis not present

## 2022-04-09 DIAGNOSIS — Z90710 Acquired absence of both cervix and uterus: Secondary | ICD-10-CM | POA: Diagnosis not present

## 2022-04-09 DIAGNOSIS — E119 Type 2 diabetes mellitus without complications: Secondary | ICD-10-CM | POA: Diagnosis not present

## 2022-04-09 DIAGNOSIS — Z7984 Long term (current) use of oral hypoglycemic drugs: Secondary | ICD-10-CM | POA: Diagnosis not present

## 2022-04-09 DIAGNOSIS — C3492 Malignant neoplasm of unspecified part of left bronchus or lung: Secondary | ICD-10-CM

## 2022-04-09 DIAGNOSIS — Z79899 Other long term (current) drug therapy: Secondary | ICD-10-CM | POA: Diagnosis not present

## 2022-04-09 DIAGNOSIS — Z7952 Long term (current) use of systemic steroids: Secondary | ICD-10-CM | POA: Diagnosis not present

## 2022-04-09 DIAGNOSIS — Z9079 Acquired absence of other genital organ(s): Secondary | ICD-10-CM | POA: Diagnosis not present

## 2022-04-09 DIAGNOSIS — C3432 Malignant neoplasm of lower lobe, left bronchus or lung: Secondary | ICD-10-CM | POA: Diagnosis not present

## 2022-04-09 DIAGNOSIS — Z95828 Presence of other vascular implants and grafts: Secondary | ICD-10-CM

## 2022-04-09 DIAGNOSIS — Z5111 Encounter for antineoplastic chemotherapy: Secondary | ICD-10-CM | POA: Diagnosis not present

## 2022-04-09 DIAGNOSIS — C7931 Secondary malignant neoplasm of brain: Secondary | ICD-10-CM | POA: Diagnosis not present

## 2022-04-09 DIAGNOSIS — Z90721 Acquired absence of ovaries, unilateral: Secondary | ICD-10-CM | POA: Diagnosis not present

## 2022-04-09 LAB — CBC WITH DIFFERENTIAL (CANCER CENTER ONLY)
Abs Immature Granulocytes: 0.04 10*3/uL (ref 0.00–0.07)
Basophils Absolute: 0 10*3/uL (ref 0.0–0.1)
Basophils Relative: 1 %
Eosinophils Absolute: 0 10*3/uL (ref 0.0–0.5)
Eosinophils Relative: 1 %
HCT: 34.8 % — ABNORMAL LOW (ref 36.0–46.0)
Hemoglobin: 12.3 g/dL (ref 12.0–15.0)
Immature Granulocytes: 1 %
Lymphocytes Relative: 19 %
Lymphs Abs: 0.8 10*3/uL (ref 0.7–4.0)
MCH: 30.3 pg (ref 26.0–34.0)
MCHC: 35.3 g/dL (ref 30.0–36.0)
MCV: 85.7 fL (ref 80.0–100.0)
Monocytes Absolute: 0.3 10*3/uL (ref 0.1–1.0)
Monocytes Relative: 6 %
Neutro Abs: 3.1 10*3/uL (ref 1.7–7.7)
Neutrophils Relative %: 72 %
Platelet Count: 108 10*3/uL — ABNORMAL LOW (ref 150–400)
RBC: 4.06 MIL/uL (ref 3.87–5.11)
RDW: 15.6 % — ABNORMAL HIGH (ref 11.5–15.5)
WBC Count: 4.3 10*3/uL (ref 4.0–10.5)
nRBC: 0 % (ref 0.0–0.2)

## 2022-04-09 LAB — CMP (CANCER CENTER ONLY)
ALT: 56 U/L — ABNORMAL HIGH (ref 0–44)
AST: 22 U/L (ref 15–41)
Albumin: 3.4 g/dL — ABNORMAL LOW (ref 3.5–5.0)
Alkaline Phosphatase: 52 U/L (ref 38–126)
Anion gap: 10 (ref 5–15)
BUN: 24 mg/dL — ABNORMAL HIGH (ref 8–23)
CO2: 27 mmol/L (ref 22–32)
Calcium: 9.2 mg/dL (ref 8.9–10.3)
Chloride: 100 mmol/L (ref 98–111)
Creatinine: 0.6 mg/dL (ref 0.44–1.00)
GFR, Estimated: >60 mL/min (ref >60)
Glucose, Bld: 277 mg/dL — ABNORMAL HIGH (ref 70–99)
Potassium: 4.2 mmol/L (ref 3.5–5.1)
Sodium: 137 mmol/L (ref 135–145)
Total Bilirubin: 0.5 mg/dL (ref 0.3–1.2)
Total Protein: 5.6 g/dL — ABNORMAL LOW (ref 6.5–8.1)

## 2022-04-09 MED ORDER — HEPARIN SOD (PORK) LOCK FLUSH 100 UNIT/ML IV SOLN
500.0000 [IU] | INTRAVENOUS | Status: AC | PRN
  Administered 2022-04-09: 500 [IU]

## 2022-04-09 MED ORDER — SODIUM CHLORIDE 0.9% FLUSH
10.0000 mL | INTRAVENOUS | Status: AC | PRN
  Administered 2022-04-09: 10 mL

## 2022-04-11 ENCOUNTER — Telehealth: Payer: Self-pay | Admitting: Medical Oncology

## 2022-04-11 NOTE — Telephone Encounter (Signed)
New symptoms Left eye gummy,not matted ,no drainage.She also reports nasal congestion ,chest congestion , periodic cough with yellow brown, green phlegm. Her mother had similar symptoms recently.  Labs on Tuesday were ok -plts 103k.  I instructed pt to use OTC meds to manage symptoms such as zyrtec, claratin. Warm compress to left eye and report fever ,chills , worsening cough with green sputum.

## 2022-04-12 ENCOUNTER — Other Ambulatory Visit: Payer: Self-pay

## 2022-04-15 ENCOUNTER — Other Ambulatory Visit: Payer: Self-pay | Admitting: Physician Assistant

## 2022-04-15 ENCOUNTER — Telehealth: Payer: Self-pay | Admitting: Physician Assistant

## 2022-04-15 DIAGNOSIS — K1379 Other lesions of oral mucosa: Secondary | ICD-10-CM

## 2022-04-15 MED ORDER — NYSTATIN 100000 UNIT/ML MT SUSP
5.0000 mL | Freq: Three times a day (TID) | OROMUCOSAL | 1 refills | Status: DC | PRN
  Filled 2022-04-15: qty 240, 16d supply, fill #0
  Filled 2022-05-14: qty 240, 16d supply, fill #1

## 2022-04-15 MED ORDER — MAGIC MOUTHWASH: 5.0000 mL | Freq: Three times a day (TID) | ORAL | 1 refills | Status: DC | PRN

## 2022-04-15 NOTE — Telephone Encounter (Signed)
I called the patient to let her know MMW is on back order at her pharmacy. Therefore, I have sent to [facility]. She is scheduled for port flush tomorrow. She will pick this up from [place] tomorrow on the way home.

## 2022-04-15 NOTE — Progress Notes (Signed)
Nystatin is on back order at her pharmacy. I have sent MMW to the [facility].

## 2022-04-15 NOTE — Telephone Encounter (Signed)
Sent MMW to different pharmacy

## 2022-04-16 ENCOUNTER — Other Ambulatory Visit: Payer: Self-pay

## 2022-04-16 ENCOUNTER — Other Ambulatory Visit (HOSPITAL_COMMUNITY): Payer: Self-pay

## 2022-04-16 ENCOUNTER — Encounter: Payer: Self-pay | Admitting: Internal Medicine

## 2022-04-16 ENCOUNTER — Inpatient Hospital Stay: Payer: BC Managed Care – PPO

## 2022-04-16 DIAGNOSIS — Z90721 Acquired absence of ovaries, unilateral: Secondary | ICD-10-CM | POA: Diagnosis not present

## 2022-04-16 DIAGNOSIS — Z9079 Acquired absence of other genital organ(s): Secondary | ICD-10-CM | POA: Diagnosis not present

## 2022-04-16 DIAGNOSIS — Z95828 Presence of other vascular implants and grafts: Secondary | ICD-10-CM

## 2022-04-16 DIAGNOSIS — Z90710 Acquired absence of both cervix and uterus: Secondary | ICD-10-CM | POA: Diagnosis not present

## 2022-04-16 DIAGNOSIS — Z5111 Encounter for antineoplastic chemotherapy: Secondary | ICD-10-CM | POA: Diagnosis not present

## 2022-04-16 DIAGNOSIS — C7931 Secondary malignant neoplasm of brain: Secondary | ICD-10-CM | POA: Diagnosis not present

## 2022-04-16 DIAGNOSIS — E119 Type 2 diabetes mellitus without complications: Secondary | ICD-10-CM | POA: Diagnosis not present

## 2022-04-16 DIAGNOSIS — C3490 Malignant neoplasm of unspecified part of unspecified bronchus or lung: Secondary | ICD-10-CM | POA: Diagnosis not present

## 2022-04-16 DIAGNOSIS — Z7984 Long term (current) use of oral hypoglycemic drugs: Secondary | ICD-10-CM | POA: Diagnosis not present

## 2022-04-16 DIAGNOSIS — Z79899 Other long term (current) drug therapy: Secondary | ICD-10-CM | POA: Diagnosis not present

## 2022-04-16 DIAGNOSIS — Z7952 Long term (current) use of systemic steroids: Secondary | ICD-10-CM | POA: Diagnosis not present

## 2022-04-16 DIAGNOSIS — C3432 Malignant neoplasm of lower lobe, left bronchus or lung: Secondary | ICD-10-CM | POA: Diagnosis not present

## 2022-04-16 DIAGNOSIS — C3492 Malignant neoplasm of unspecified part of left bronchus or lung: Secondary | ICD-10-CM

## 2022-04-16 DIAGNOSIS — Z5112 Encounter for antineoplastic immunotherapy: Secondary | ICD-10-CM | POA: Diagnosis not present

## 2022-04-16 LAB — CBC WITH DIFFERENTIAL (CANCER CENTER ONLY)
Abs Immature Granulocytes: 0.06 10*3/uL (ref 0.00–0.07)
Basophils Absolute: 0 10*3/uL (ref 0.0–0.1)
Basophils Relative: 0 %
Eosinophils Absolute: 0 10*3/uL (ref 0.0–0.5)
Eosinophils Relative: 0 %
HCT: 32.8 % — ABNORMAL LOW (ref 36.0–46.0)
Hemoglobin: 11.5 g/dL — ABNORMAL LOW (ref 12.0–15.0)
Immature Granulocytes: 1 %
Lymphocytes Relative: 18 %
Lymphs Abs: 1.1 10*3/uL (ref 0.7–4.0)
MCH: 30.9 pg (ref 26.0–34.0)
MCHC: 35.1 g/dL (ref 30.0–36.0)
MCV: 88.2 fL (ref 80.0–100.0)
Monocytes Absolute: 0.4 10*3/uL (ref 0.1–1.0)
Monocytes Relative: 6 %
Neutro Abs: 4.4 10*3/uL (ref 1.7–7.7)
Neutrophils Relative %: 75 %
Platelet Count: 114 10*3/uL — ABNORMAL LOW (ref 150–400)
RBC: 3.72 MIL/uL — ABNORMAL LOW (ref 3.87–5.11)
RDW: 18.7 % — ABNORMAL HIGH (ref 11.5–15.5)
WBC Count: 5.9 10*3/uL (ref 4.0–10.5)
nRBC: 0.3 % — ABNORMAL HIGH (ref 0.0–0.2)

## 2022-04-16 LAB — CMP (CANCER CENTER ONLY)
ALT: 64 U/L — ABNORMAL HIGH (ref 0–44)
AST: 20 U/L (ref 15–41)
Albumin: 3.5 g/dL (ref 3.5–5.0)
Alkaline Phosphatase: 48 U/L (ref 38–126)
Anion gap: 8 (ref 5–15)
BUN: 19 mg/dL (ref 8–23)
CO2: 26 mmol/L (ref 22–32)
Calcium: 9.1 mg/dL (ref 8.9–10.3)
Chloride: 102 mmol/L (ref 98–111)
Creatinine: 0.61 mg/dL (ref 0.44–1.00)
GFR, Estimated: >60 mL/min (ref >60)
Glucose, Bld: 330 mg/dL — ABNORMAL HIGH (ref 70–99)
Potassium: 4.2 mmol/L (ref 3.5–5.1)
Sodium: 136 mmol/L (ref 135–145)
Total Bilirubin: 0.5 mg/dL (ref 0.3–1.2)
Total Protein: 6.3 g/dL — ABNORMAL LOW (ref 6.5–8.1)

## 2022-04-16 MED ORDER — SODIUM CHLORIDE 0.9% FLUSH
10.0000 mL | INTRAVENOUS | Status: AC | PRN
  Administered 2022-04-16: 10 mL

## 2022-04-16 MED ORDER — HEPARIN SOD (PORK) LOCK FLUSH 100 UNIT/ML IV SOLN
500.0000 [IU] | INTRAVENOUS | Status: AC | PRN
  Administered 2022-04-16: 500 [IU]

## 2022-04-17 ENCOUNTER — Ambulatory Visit: Payer: BC Managed Care – PPO

## 2022-04-17 ENCOUNTER — Ambulatory Visit: Payer: BC Managed Care – PPO | Admitting: Internal Medicine

## 2022-04-17 ENCOUNTER — Other Ambulatory Visit: Payer: BC Managed Care – PPO

## 2022-04-19 ENCOUNTER — Encounter: Payer: Self-pay | Admitting: Internal Medicine

## 2022-04-20 LAB — GUARDANT 360

## 2022-04-23 ENCOUNTER — Ambulatory Visit: Payer: BC Managed Care – PPO | Admitting: Family

## 2022-04-23 MED FILL — Dexamethasone Sodium Phosphate Inj 100 MG/10ML: INTRAMUSCULAR | Qty: 1 | Status: AC

## 2022-04-23 MED FILL — Fosaprepitant Dimeglumine For IV Infusion 150 MG (Base Eq): INTRAVENOUS | Qty: 5 | Status: AC

## 2022-04-24 ENCOUNTER — Inpatient Hospital Stay: Payer: BC Managed Care – PPO

## 2022-04-24 ENCOUNTER — Other Ambulatory Visit: Payer: BC Managed Care – PPO

## 2022-04-24 ENCOUNTER — Other Ambulatory Visit: Payer: Self-pay

## 2022-04-24 ENCOUNTER — Inpatient Hospital Stay: Payer: BC Managed Care – PPO | Attending: Physician Assistant | Admitting: Internal Medicine

## 2022-04-24 VITALS — BP 131/74 | HR 69 | Temp 97.6°F | Resp 16 | Wt 246.0 lb

## 2022-04-24 DIAGNOSIS — Z7984 Long term (current) use of oral hypoglycemic drugs: Secondary | ICD-10-CM | POA: Insufficient documentation

## 2022-04-24 DIAGNOSIS — C3492 Malignant neoplasm of unspecified part of left bronchus or lung: Secondary | ICD-10-CM

## 2022-04-24 DIAGNOSIS — C3490 Malignant neoplasm of unspecified part of unspecified bronchus or lung: Secondary | ICD-10-CM

## 2022-04-24 DIAGNOSIS — C7931 Secondary malignant neoplasm of brain: Secondary | ICD-10-CM | POA: Diagnosis not present

## 2022-04-24 DIAGNOSIS — E119 Type 2 diabetes mellitus without complications: Secondary | ICD-10-CM | POA: Insufficient documentation

## 2022-04-24 DIAGNOSIS — I10 Essential (primary) hypertension: Secondary | ICD-10-CM | POA: Insufficient documentation

## 2022-04-24 DIAGNOSIS — Z5111 Encounter for antineoplastic chemotherapy: Secondary | ICD-10-CM | POA: Diagnosis not present

## 2022-04-24 DIAGNOSIS — Z5112 Encounter for antineoplastic immunotherapy: Secondary | ICD-10-CM | POA: Insufficient documentation

## 2022-04-24 DIAGNOSIS — C3432 Malignant neoplasm of lower lobe, left bronchus or lung: Secondary | ICD-10-CM | POA: Diagnosis not present

## 2022-04-24 DIAGNOSIS — Z79899 Other long term (current) drug therapy: Secondary | ICD-10-CM | POA: Insufficient documentation

## 2022-04-24 DIAGNOSIS — Z90710 Acquired absence of both cervix and uterus: Secondary | ICD-10-CM | POA: Diagnosis not present

## 2022-04-24 DIAGNOSIS — Z95828 Presence of other vascular implants and grafts: Secondary | ICD-10-CM | POA: Insufficient documentation

## 2022-04-24 DIAGNOSIS — Z7952 Long term (current) use of systemic steroids: Secondary | ICD-10-CM | POA: Diagnosis not present

## 2022-04-24 LAB — CMP (CANCER CENTER ONLY)
ALT: 58 U/L — ABNORMAL HIGH (ref 0–44)
AST: 19 U/L (ref 15–41)
Albumin: 3.5 g/dL (ref 3.5–5.0)
Alkaline Phosphatase: 50 U/L (ref 38–126)
Anion gap: 9 (ref 5–15)
BUN: 18 mg/dL (ref 8–23)
CO2: 28 mmol/L (ref 22–32)
Calcium: 9.4 mg/dL (ref 8.9–10.3)
Chloride: 102 mmol/L (ref 98–111)
Creatinine: 0.61 mg/dL (ref 0.44–1.00)
GFR, Estimated: >60 mL/min (ref >60)
Glucose, Bld: 209 mg/dL — ABNORMAL HIGH (ref 70–99)
Potassium: 3.9 mmol/L (ref 3.5–5.1)
Sodium: 139 mmol/L (ref 135–145)
Total Bilirubin: 0.6 mg/dL (ref 0.3–1.2)
Total Protein: 5.7 g/dL — ABNORMAL LOW (ref 6.5–8.1)

## 2022-04-24 LAB — CBC WITH DIFFERENTIAL (CANCER CENTER ONLY)
Abs Immature Granulocytes: 0.16 10*3/uL — ABNORMAL HIGH (ref 0.00–0.07)
Basophils Absolute: 0.1 10*3/uL (ref 0.0–0.1)
Basophils Relative: 1 %
Eosinophils Absolute: 0 10*3/uL (ref 0.0–0.5)
Eosinophils Relative: 0 %
HCT: 35.4 % — ABNORMAL LOW (ref 36.0–46.0)
Hemoglobin: 12.2 g/dL (ref 12.0–15.0)
Immature Granulocytes: 2 %
Lymphocytes Relative: 24 %
Lymphs Abs: 2.5 10*3/uL (ref 0.7–4.0)
MCH: 31.5 pg (ref 26.0–34.0)
MCHC: 34.5 g/dL (ref 30.0–36.0)
MCV: 91.5 fL (ref 80.0–100.0)
Monocytes Absolute: 0.8 10*3/uL (ref 0.1–1.0)
Monocytes Relative: 8 %
Neutro Abs: 6.8 10*3/uL (ref 1.7–7.7)
Neutrophils Relative %: 65 %
Platelet Count: 191 10*3/uL (ref 150–400)
RBC: 3.87 MIL/uL (ref 3.87–5.11)
RDW: 21.2 % — ABNORMAL HIGH (ref 11.5–15.5)
WBC Count: 10.3 10*3/uL (ref 4.0–10.5)
nRBC: 0.5 % — ABNORMAL HIGH (ref 0.0–0.2)

## 2022-04-24 LAB — TSH: TSH: 3.791 u[IU]/mL (ref 0.350–4.500)

## 2022-04-24 MED ORDER — PALONOSETRON HCL INJECTION 0.25 MG/5ML
0.2500 mg | Freq: Once | INTRAVENOUS | Status: AC
  Administered 2022-04-24: 0.25 mg via INTRAVENOUS
  Filled 2022-04-24: qty 5

## 2022-04-24 MED ORDER — SODIUM CHLORIDE 0.9 % IV SOLN
200.0000 mg | Freq: Once | INTRAVENOUS | Status: AC
  Administered 2022-04-24: 200 mg via INTRAVENOUS
  Filled 2022-04-24: qty 8

## 2022-04-24 MED ORDER — SODIUM CHLORIDE 0.9 % IV SOLN
150.0000 mg | Freq: Once | INTRAVENOUS | Status: AC
  Administered 2022-04-24: 150 mg via INTRAVENOUS
  Filled 2022-04-24: qty 150

## 2022-04-24 MED ORDER — SODIUM CHLORIDE 0.9% FLUSH
10.0000 mL | Freq: Once | INTRAVENOUS | Status: AC
  Administered 2022-04-24: 10 mL

## 2022-04-24 MED ORDER — SODIUM CHLORIDE 0.9 % IV SOLN
500.0000 mg/m2 | Freq: Once | INTRAVENOUS | Status: AC
  Administered 2022-04-24: 1100 mg via INTRAVENOUS
  Filled 2022-04-24: qty 40

## 2022-04-24 MED ORDER — SODIUM CHLORIDE 0.9 % IV SOLN: Freq: Once | INTRAVENOUS | Status: AC

## 2022-04-24 MED ORDER — SODIUM CHLORIDE 0.9% FLUSH
10.0000 mL | INTRAVENOUS | Status: DC | PRN
  Administered 2022-04-24: 10 mL

## 2022-04-24 MED ORDER — SODIUM CHLORIDE 0.9 % IV SOLN
10.0000 mg | Freq: Once | INTRAVENOUS | Status: AC
  Administered 2022-04-24: 10 mg via INTRAVENOUS
  Filled 2022-04-24: qty 10

## 2022-04-24 MED ORDER — HEPARIN SOD (PORK) LOCK FLUSH 100 UNIT/ML IV SOLN
500.0000 [IU] | Freq: Once | INTRAVENOUS | Status: AC | PRN
  Administered 2022-04-24: 500 [IU]

## 2022-04-24 MED ORDER — CYANOCOBALAMIN 1000 MCG/ML IJ SOLN
1000.0000 ug | Freq: Once | INTRAMUSCULAR | Status: AC
  Administered 2022-04-24: 1000 ug via INTRAMUSCULAR
  Filled 2022-04-24: qty 1

## 2022-04-24 MED ORDER — SODIUM CHLORIDE 0.9 % IV SOLN
739.0000 mg | Freq: Once | INTRAVENOUS | Status: AC
  Administered 2022-04-24: 750 mg via INTRAVENOUS
  Filled 2022-04-24: qty 75

## 2022-04-24 NOTE — Patient Instructions (Addendum)
[facility]  [facility]  Discharge Instructions: Thank you for choosing [facility] to provide your oncology and hematology care.   If you have a lab appointment with the [facility], please go directly to the [facility] and check in at the registration area.   Wear comfortable clothing and clothing appropriate for easy access to any Portacath or PICC line.   We strive to give you quality time with your provider. You may need to reschedule your appointment if you arrive late (15 or more minutes).  Arriving late affects you and other patients whose appointments are after yours.  Also, if you miss three or more appointments without notifying the office, you may be dismissed from the clinic at the provider's discretion.      For prescription refill requests, have your pharmacy contact our office and allow 72 hours for refills to be completed.    Today you received the following chemotherapy and/or immunotherapy agents: pembrolizumab, pemetrexed, and carboplatin      To help prevent nausea and vomiting after your treatment, we encourage you to take your nausea medication as directed.  BELOW ARE SYMPTOMS THAT SHOULD BE REPORTED IMMEDIATELY: *FEVER GREATER THAN 100.4 F (38 C) OR HIGHER *CHILLS OR SWEATING *NAUSEA AND VOMITING THAT IS NOT CONTROLLED WITH YOUR NAUSEA MEDICATION *UNUSUAL SHORTNESS OF BREATH *UNUSUAL BRUISING OR BLEEDING *URINARY PROBLEMS (pain or burning when urinating, or frequent urination) *BOWEL PROBLEMS (unusual diarrhea, constipation, pain near the anus) TENDERNESS IN MOUTH AND THROAT WITH OR WITHOUT PRESENCE OF ULCERS (sore throat, sores in mouth, or a toothache) UNUSUAL RASH, SWELLING OR PAIN  UNUSUAL VAGINAL DISCHARGE OR ITCHING   Items with * indicate a potential emergency and should be followed up as soon as possible or go to the Emergency Department if any problems should  occur.  Please show the CHEMOTHERAPY ALERT CARD or IMMUNOTHERAPY ALERT CARD at check-in to the Emergency Department and triage nurse.  Should you have questions after your visit or need to cancel or reschedule your appointment, please contact [facility]  Dept: [phone]  and follow the prompts.  Office hours are 8:00 a.m. to 4:30 p.m. Monday - Friday. Please note that voicemails left after 4:00 p.m. may not be returned until the following business day.  We are closed weekends and major holidays. You have access to a nurse at all times for urgent questions. Please call the main number to the clinic Dept: [phone] and follow the prompts.   For any non-urgent questions, you may also contact your provider using MyChart. We now offer e-Visits for anyone 30 and older to request care online for non-urgent symptoms. For details visit [email or web address].   Also download the MyChart app! Go to the app store, search "MyChart", open the app, select [place], and log in with your MyChart username and password.

## 2022-04-24 NOTE — Progress Notes (Signed)
[facility] Telephone:([phone]   Fax:([phone]  OFFICE PROGRESS NOTE  [name], [address] [place] [address]  DIAGNOSIS: Stage IV (T3, N0, M1b) non-small cell lung cancer, adenosquamous carcinoma presented with large medial left lower lobe lung mass with bilateral pulmonary nodules and metastatic disease to the brain diagnosed in November 2023   Detected Alteration(s) / Biomarker(s) Associated FDA-approved therapies Clinical Trial Availability % cfDNA or Amplification  ATM [id number] approved in other indication Olaparib, Talazoparib Yes 0.5%  ATM R337H ND None None  . PD-L1 expression is negative.  PRIOR THERAPY: Status post SRS to brain metastasis completed March 26, 2022    CURRENT THERAPY: Systemic chemotherapy with carboplatin for AUC of 5, Alimta 500 Mg/M2 and Keytruda 200 Mg IV every 3 weeks status post 1 cycle. First dose was given on March 06, 2022.  Status post 2 cycles.  INTERVAL HISTORY: Carolyn Hooper 62 y.o. female returns to the clinic today for follow-up visit accompanied by her son.  The patient is feeling fine today with no concerning complaints except for mild fatigue.  She denied having any current chest pain, shortness of breath except with exertion with no cough or hemoptysis.  She has no nausea, vomiting, diarrhea or constipation.  She has no headache or visual changes.  She denied having any fever or chills.  She has no recent weight loss or night sweats.  She has been tolerating her systemic chemotherapy fairly well.  She is here for evaluation before starting cycle #3.  MEDICAL HISTORY: Past Medical History:  Diagnosis Date   Abscess    hydroadenitis superativa   Anxiety    DM (diabetes mellitus) ([place])    Hyperinsulinemia    Hypertension 1983   gestational - pre-eclampsia   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[facility])    Neuroma    audiotry neuroma   Seizure ([place])    Vertigo     ALLERGIES:  is  allergic to adhesive [tape] and statins.  MEDICATIONS:  Current Outpatient Medications  Medication Sig Dispense Refill   albuterol (VENTOLIN HFA) 108 (90 Base) MCG/ACT inhaler Inhale 2 puffs into the lungs every 6 (six) hours as needed. 18 g 2   blood glucose meter kit and supplies Dispense based on patient and insurance preference. Use up to four times daily as directed. (FOR ICD-10 E10.9, E11.9). 1 each 0   clonazePAM (KLONOPIN) 0.5 MG tablet Take 0.5 tablets (0.25 mg total) by mouth at bedtime. For anxiety (Patient taking differently: Take 0.25 mg by mouth daily as needed for anxiety (and vertigo).) 30 tablet 2   dexamethasone (DECADRON) 1 MG tablet Take 3 tablets (3 mg total) by mouth daily with breakfast. 90 tablet 1   folic acid (FOLVITE) 1 MG tablet Take 1 tablet (1 mg total) by mouth daily. 30 tablet 2   levETIRAcetam (KEPPRA) 1000 MG tablet Take 1 tablet (1,000 mg total) by mouth 2 (two) times daily. 60 tablet 2   magic mouthwash (nystatin, diphenhydrAMINE, alum & mag hydroxide) suspension mixture Take 5 mLs by mouth 3 (three) times daily as needed for mouth pain. 240 mL 1   metFORMIN (GLUCOPHAGE) 1000 MG tablet Take 1 tablet (1,000 mg total) by mouth 2 (two) times daily. (Patient taking differently: Take 500-1,000 mg by mouth See admin instructions. 500 mg or 1000 mg with meals depending on how much carbs and sugars Pt eats) 180 tablet 1   metFORMIN (GLUCOPHAGE) 850 MG tablet  Take 425-850 mg by mouth See admin instructions. 425 mg - 850 mg with meals, depending on how much carbs and sugars Pt eats     nystatin (MYCOSTATIN) 100000 UNIT/ML suspension TAKE 5 ML BY MOUTH  4 TIMES DAILY AS NEEDED 60 mL 0   ondansetron (ZOFRAN) 4 MG tablet Take 1 tablet (4 mg total) by mouth every 6 (six) hours as needed for nausea. 20 tablet 0   prochlorperazine (COMPAZINE) 10 MG tablet Take 1 tablet (10 mg total) by mouth every 6 (six) hours as needed. 30 tablet 2   No current facility-administered  medications for this visit.    SURGICAL HISTORY:  Past Surgical History:  Procedure Laterality Date   ABDOMINAL HYSTERECTOMY  2006   remaining left ovary   BRONCHIAL BIOPSY  02/19/2022   Procedure: BRONCHIAL BIOPSIES;  Surgeon: Garner Nash, DO;  Location: [facility];  Service: Pulmonary;;   BRONCHIAL BRUSHINGS  02/19/2022   Procedure: BRONCHIAL BRUSHINGS;  Surgeon: Garner Nash, DO;  Location: [facility];  Service: Pulmonary;;   BRONCHIAL NEEDLE ASPIRATION BIOPSY  02/19/2022   Procedure: BRONCHIAL NEEDLE ASPIRATION BIOPSIES;  Surgeon: Garner Nash, DO;  Location: [facility];  Service: Pulmonary;;   IR IMAGING GUIDED PORT INSERTION  03/29/2022   SHOULDER SURGERY Right 1960s    REVIEW OF SYSTEMS:  A comprehensive review of systems was negative except for: Constitutional: positive for fatigue   PHYSICAL EXAMINATION: General appearance: alert, cooperative, fatigued, and no distress Head: Normocephalic, without obvious abnormality, atraumatic Neck: no adenopathy, no JVD, supple, symmetrical, trachea midline, and thyroid not enlarged, symmetric, no tenderness/mass/nodules Lymph nodes: Cervical, supraclavicular, and axillary nodes normal. Resp: clear to auscultation bilaterally Back: symmetric, no curvature. ROM normal. No CVA tenderness. Cardio: regular rate and rhythm, S1, S2 normal, no murmur, click, rub or gallop GI: soft, non-tender; bowel sounds normal; no masses,  no organomegaly Extremities: extremities normal, atraumatic, no cyanosis or edema  ECOG PERFORMANCE STATUS: 1 - Symptomatic but completely ambulatory  Blood pressure 131/74, pulse 69, temperature 97.6 F (36.4 C), temperature source Oral, resp. rate 16, weight 246 lb (111.6 kg), SpO2 96 %.  LABORATORY DATA: Lab Results  Component Value Date   WBC 10.3 04/24/2022   HGB 12.2 04/24/2022   HCT 35.4 (L) 04/24/2022   MCV 91.5 04/24/2022   PLT 191 04/24/2022      Chemistry      Component Value  Date/Time   NA 139 04/24/2022 1012   NA 141 11/01/2021 1009   K 3.9 04/24/2022 1012   CL 102 04/24/2022 1012   CO2 28 04/24/2022 1012   BUN 18 04/24/2022 1012   BUN 13 11/01/2021 1009   CREATININE 0.61 04/24/2022 1012      Component Value Date/Time   CALCIUM 9.4 04/24/2022 1012   ALKPHOS 50 04/24/2022 1012   AST 19 04/24/2022 1012   ALT 58 (H) 04/24/2022 1012   BILITOT 0.6 04/24/2022 1012       RADIOGRAPHIC STUDIES: IR IMAGING GUIDED PORT INSERTION  Result Date: 03/29/2022 INDICATION: 62 year old female with left lower lobe non-small cell lung cancer. She presents for port catheter placement prior to chemotherapy. EXAM: IMPLANTED PORT A CATH PLACEMENT WITH ULTRASOUND AND FLUOROSCOPIC GUIDANCE MEDICATIONS: None. ANESTHESIA/SEDATION: Versed 2 mg IV; Fentanyl 100 mcg IV; Moderate Sedation Time:  15 minutes The patient's vital signs and level of consciousness were continuously monitored during the procedure by the interventional radiology nurse under my direct supervision. FLUOROSCOPY: Radiation exposure index: COMPLICATIONS: None immediate. PROCEDURE: The  right neck and chest was prepped with chlorhexidine, and draped in the usual sterile fashion using maximum barrier technique (cap and mask, sterile gown, sterile gloves, large sterile sheet, hand hygiene and cutaneous antiseptic). Local anesthesia was attained by infiltration with 1% lidocaine with epinephrine. Ultrasound demonstrated patency of the right internal jugular vein, and this was documented with an image. Under real-time ultrasound guidance, this vein was accessed with a 21 gauge micropuncture needle and image documentation was performed. A small dermatotomy was made at the access site with an 11 scalpel. A 0.018" wire was advanced into the SVC and the access needle exchanged for a 44F micropuncture vascular sheath. The 0.018" wire was then removed and a 0.035" wire advanced into the IVC. An appropriate location for the subcutaneous  reservoir was selected below the clavicle and an incision was made through the skin and underlying soft tissues. The subcutaneous tissues were then dissected using a combination of blunt and sharp surgical technique and a pocket was formed. A single lumen power injectable portacatheter was then tunneled through the subcutaneous tissues from the pocket to the dermatotomy and the port reservoir placed within the subcutaneous pocket. The venous access site was then serially dilated and a peel away vascular sheath placed over the wire. The wire was removed and the port catheter advanced into position under fluoroscopic guidance. The catheter tip is positioned in the superior cavoatrial junction. This was documented with a spot image. The portacatheter was then tested and found to flush and aspirate well. The port was flushed with saline followed by 100 units/mL heparinized saline. The pocket was then closed in two layers using first subdermal inverted interrupted absorbable sutures followed by a running subcuticular suture. The epidermis was then sealed with Dermabond. The dermatotomy at the venous access site was also closed with Dermabond. IMPRESSION: Successful placement of a right IJ approach Power Port with ultrasound and fluoroscopic guidance. The catheter is ready for use. Electronically Signed   By: Jacqulynn Cadet M.D.   On: 03/29/2022 15:28    ASSESSMENT AND PLAN: This is a very pleasant 62 years old white female with Stage IV (T3, N0, M1b) non-small cell lung cancer, adenosquamous carcinoma presented with large medial left lower lobe lung mass with bilateral pulmonary nodules and metastatic disease to the brain diagnosed in November 2023 the patient has no actionable mutations and has negative PD-L1 expression. 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 She is currently undergoing treatment with systemic chemotherapy with carboplatin for AUC of 5, Alimta 500 Mg/M2 and Keytruda 200 Mg IV every 3 weeks  status post 2 cycles.  She has been tolerating her treatment fairly well with no concerning adverse effect except for mild fatigue. I recommended for the patient to proceed with cycle #3 today as planned. I will see her back for follow-up visit in 3 weeks for evaluation with repeat CT scan of the chest, abdomen and pelvis for restaging of her disease. The patient was advised to call immediately if she has any other concerning symptoms in the interval. The patient voices understanding of current disease status and treatment options and is in agreement with the current care plan.  All questions were answered. The patient knows to call the clinic with any problems, questions or concerns. We can certainly see the patient much sooner if necessary.  The total time spent in the appointment was 20 minutes.  Disclaimer: This note was dictated with voice recognition software. Similar sounding words can inadvertently be transcribed and  may not be corrected upon review.

## 2022-04-25 ENCOUNTER — Other Ambulatory Visit (HOSPITAL_COMMUNITY): Payer: Self-pay

## 2022-04-25 ENCOUNTER — Other Ambulatory Visit: Payer: Self-pay | Admitting: *Deleted

## 2022-04-25 DIAGNOSIS — R918 Other nonspecific abnormal finding of lung field: Secondary | ICD-10-CM

## 2022-04-25 LAB — T4: T4, Total: 8 ug/dL (ref 4.5–12.0)

## 2022-04-25 MED ORDER — LIDOCAINE-PRILOCAINE 2.5-2.5 % EX CREA
1.0000 | TOPICAL_CREAM | CUTANEOUS | 0 refills | Status: DC | PRN
  Filled 2022-04-25: qty 30, 30d supply, fill #0

## 2022-04-25 MED ORDER — FOLIC ACID 1 MG PO TABS
1.0000 mg | ORAL_TABLET | Freq: Every day | ORAL | 2 refills | Status: DC
  Filled 2022-04-25: qty 30, 30d supply, fill #0

## 2022-04-29 ENCOUNTER — Other Ambulatory Visit (HOSPITAL_COMMUNITY): Payer: Self-pay

## 2022-04-29 NOTE — Therapy (Incomplete)
OUTPATIENT OCCUPATIONAL THERAPY ORTHO EVALUATION  Patient Name: Carolyn Hooper MRN: [id number] DOB:1960-09-25, 62 y.o., female Today's Date: 04/29/2022  PCP: Evelina Dun, [place]  REFERRING PROVIDER: Ventura Sellers, MD   END OF SESSION:   Past Medical History:  Diagnosis Date   Abscess    hydroadenitis superativa   Anxiety    DM (diabetes mellitus) ([place])    Hyperinsulinemia    Hypertension 1983   gestational - pre-eclampsia   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[facility])    Neuroma    audiotry neuroma   Seizure ([place])    Vertigo    Past Surgical History:  Procedure Laterality Date   ABDOMINAL HYSTERECTOMY  2006   remaining left ovary   BRONCHIAL BIOPSY  02/19/2022   Procedure: BRONCHIAL BIOPSIES;  Surgeon: Garner Nash, DO;  Location: [facility];  Service: Pulmonary;;   BRONCHIAL BRUSHINGS  02/19/2022   Procedure: BRONCHIAL BRUSHINGS;  Surgeon: Garner Nash, DO;  Location: [facility];  Service: Pulmonary;;   BRONCHIAL NEEDLE ASPIRATION BIOPSY  02/19/2022   Procedure: BRONCHIAL NEEDLE ASPIRATION BIOPSIES;  Surgeon: Garner Nash, DO;  Location: [facility];  Service: Pulmonary;;   IR IMAGING GUIDED PORT INSERTION  03/29/2022   SHOULDER SURGERY Right 1960s   Patient Active Problem List   Diagnosis Date Noted   Port-A-Cath in place 04/24/2022   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 ([place]) 03/07/2022   Focal seizure ([place]) 03/07/2022   Non-small cell lung cancer ([place]) 02/25/2022   Goals of care, counseling/discussion 02/25/2022   Lung mass 02/13/2022   Subarachnoid hemorrhage ([place]) 02/12/2022   Aphasia 02/12/2022   Brain mass 02/12/2022   Leukocytosis 02/12/2022   Mass of lower lobe of left lung 02/12/2022   Unilateral vestibular schwannoma ([place]) 01/08/2021   Refusal of statin medication by patient 07/10/2020   Myalgia due to statin 07/10/2020   Controlled substance agreement signed 05/28/2018   Benzodiazepine dependence ([place]) 05/28/2018    Hyperlipidemia associated with type 2 diabetes mellitus ([place]) 04/11/2016   Morbid obesity with BMI of 40.0-44.9, adult ([place]) 04/11/2016   Current smoker 04/11/2016   Hidradenitis suppurativa 04/11/2016   Generalized anxiety disorder 08/19/2013   Uncontrolled type 2 diabetes mellitus with hyperglycemia, without long-term current use of insulin ([place]) 08/19/2013   Hyperinsulinemia 08/19/2013    ONSET DATE: ***  REFERRING DIAG: C34.90,C79.31 (ICD-10-CM) - 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  Rationale for Evaluation and Treatment: Rehabilitation  SUBJECTIVE:   SUBJECTIVE STATEMENT: She states ***.  Pt accompanied by: {accompnied:27141}  PERTINENT HISTORY: ***  PRECAUTIONS: {Therapy precautions:24002}  WEIGHT BEARING RESTRICTIONS: {Yes ***/No:24003}  PAIN:  Are you having pain? *** in *** Rating: ***/10 at rest now, up to ***/10 at worst in past week   FALLS: Has patient fallen in last 6 months? {fallsyesno:27318}  LIVING ENVIRONMENT: Lives with: {OPRC lives with:25569::"lives with their family"} Lives in: {Lives in:25570} Stairs: {opstairs:27293} Has following equipment at home: {Assistive devices:23999}  PLOF: {PLOF:24004}  PATIENT GOALS: ***   OBJECTIVE: (All objective assessments below are from initial evaluation on: 05/01/22 unless otherwise specified.)   HAND DOMINANCE: Right ***  ADLs: Overall ADLs: States decreased ability to grab, hold household objects, pain and inability to open containers, perform FMS tasks (manipulate fasteners on clothing), mild to moderate bathing problems as well. ***   FUNCTIONAL OUTCOME MEASURES: Eval: Quck DASH ***% impairment today  (Higher % Score  =  More Impairment)    Patient  Specific Functional Scale: *** (***, ***, ***)  (Higher Score  =  Better Ability for the Selected Tasks)     Patient Rated Wrist Evaluation (PRWE): Pain: ***/50; Function: ***/50; Total Score:  ***/100 (Higher Score  =  More Pain and/or Debility)    UPPER EXTREMITY ROM     Shoulder to Wrist AROM Right eval Left eval  Shoulder flexion    Shoulder abduction    Shoulder extension    Shoulder internal rotation    Shoulder external rotation    Elbow flexion    Elbow extension    Forearm supination    Forearm pronation     Wrist flexion    Wrist extension    Wrist ulnar deviation    Wrist radial deviation    Functional dart thrower's motion (F-DTM) in ulnar flexion    F-DTM in radial extension     (Blank rows = not tested)   Hand AROM Right eval Left eval  Full Fist Ability (or Gap to Distal Palmar Crease)    Thumb Opposition  (Kapandji Scale)     Thumb MCP (0-60)    Thumb IP (0-80)    Thumb Radial Abduction Span     Thumb Palmar Abduction Span     Index MCP (0-90)     Index PIP (0-100)     Index DIP (0-70)      Long MCP (0-90)      Long PIP (0-100)      Long DIP (0-70)      Ring MCP (0-90)      Ring PIP (0-100)      Ring DIP (0-70)      Little MCP (0-90)      Little PIP (0-100)      Little DIP (0-70)      (Blank rows = not tested)   UPPER EXTREMITY MMT:    Eval: *** NT at eval due to recent and still healing injuries. Will be tested when appropriate.   MMT Right TBD Left TBD  Shoulder flexion    Shoulder abduction    Shoulder adduction    Shoulder extension    Shoulder internal rotation    Shoulder external rotation    Middle trapezius    Lower trapezius    Elbow flexion    Elbow extension    Forearm supination    Forearm pronation    Wrist flexion    Wrist extension    Wrist ulnar deviation    Wrist radial deviation    (Blank rows = not tested)  HAND FUNCTION: Eval: Observed weakness in affected hand.  Grip strength Right: *** lbs, Left: *** lbs   COORDINATION: Eval: Observed coordination impairments with affected hand. Box and Blocks Test: *** Blocks today (*** is [facility]); 9 Hole Peg Test Right: ***sec, Left: *** sec (*** sec is  WFL)   SENSATION: Eval: *** Light touch intact today, though diminished around sx area    EDEMA:   Eval: *** Mildly swollen in hand and wrist today, ***cm circumferentially around ***  COGNITION: Eval: Overall cognitive status: WFL for evaluation today ***  OBSERVATIONS:   Eval: ***   TODAY'S TREATMENT:  Post-evaluation treatment: ***    PATIENT EDUCATION: Education details: See tx section above for details  Person educated: Patient Education method: Veterinary surgeon, Teach back, Handouts  Education comprehension: States and demonstrates understanding, Additional Education required    HOME EXERCISE PROGRAM: See tx section above for details    GOALS: Goals reviewed with  patient? Yes   SHORT TERM GOALS: (STG required if POC>30 days) Target Date: ***  Pt will obtain protective, custom orthotic. Goal status: TBD/PRN,  MET ***  2.  Pt will demo/state understanding of initial HEP to improve pain levels and prerequisite motion. Goal status: INITIAL   LONG TERM GOALS: Target Date: ***  Pt will improve functional ability by decreased impairment per Quick DASH / PSFS / PRWE assessment from *** to *** or better, for better quality of life. Goal status: INITIAL  2.  Pt will improve grip strength in *** hand from ***lbs to at least ***lbs for functional use at home and in IADLs. Goal status: INITIAL  3.  Pt will improve A/ROM in *** from *** to at least ***, to have functional motion for tasks like reach and grasp.  Goal status: INITIAL  4.  Pt will improve strength in *** from *** MMT to at least *** MMT to have increased functional ability to carry out selfcare and higher-level homecare tasks with no difficulty. Goal status: INITIAL  5.  Pt will improve coordination skills in ***, as seen by better score on *** testing to have increased functional ability to carry out fine motor tasks (fasteners, etc.) and more complex, coordinated IADLs (meal prep, sports, etc.).   Goal status: INITIAL  6.  Pt will decrease pain at worst from ***/10 to ***/10 or better to have better sleep and occupational participation in daily roles. Goal status: INITIAL   ASSESSMENT:  CLINICAL IMPRESSION: Patient is a 62 y.o. female who was seen today for occupational therapy evaluation for Lt CuTS, and poor coordination that effects handwriting and limits daily abilities. She will benefit from OP OT to increase quality of life.    PERFORMANCE DEFICITS: in functional skills including {OT physical skills:25468}, cognitive skills including {OT cognitive skills:25469}, and psychosocial skills including {OT psychosocial skills:25470}.   IMPAIRMENTS: are limiting patient from {OT performance deficits:25471}.   COMORBIDITIES: {Comorbidities:25485} that affects occupational performance. Patient will benefit from skilled OT to address above impairments and improve overall function.  MODIFICATION OR ASSISTANCE TO COMPLETE EVALUATION: {OT modification:25474}  OT OCCUPATIONAL PROFILE AND HISTORY: {OT PROFILE AND HISTORY:25484}  CLINICAL DECISION MAKING: {OT CDM:25475}  REHAB POTENTIAL: {rehabpotential:25112}  EVALUATION COMPLEXITY: {Evaluation complexity:25115}      PLAN:  OT FREQUENCY: {rehab frequency:25116}  OT DURATION: {rehab duration:25117}  PLANNED INTERVENTIONS: {OT Interventions:25467}  RECOMMENDED OTHER SERVICES: ***  CONSULTED AND AGREED WITH PLAN OF CARE: {ZOX:09604}  PLAN FOR NEXT SESSION: ***   Benito Mccreedy, OTR/L, CHT 04/29/2022, 8:31 AM

## 2022-04-30 ENCOUNTER — Inpatient Hospital Stay: Payer: BC Managed Care – PPO

## 2022-04-30 ENCOUNTER — Other Ambulatory Visit: Payer: Self-pay

## 2022-04-30 DIAGNOSIS — Z5112 Encounter for antineoplastic immunotherapy: Secondary | ICD-10-CM | POA: Diagnosis not present

## 2022-04-30 DIAGNOSIS — Z7952 Long term (current) use of systemic steroids: Secondary | ICD-10-CM | POA: Diagnosis not present

## 2022-04-30 DIAGNOSIS — I10 Essential (primary) hypertension: Secondary | ICD-10-CM | POA: Diagnosis not present

## 2022-04-30 DIAGNOSIS — C3492 Malignant neoplasm of unspecified part of left bronchus or lung: Secondary | ICD-10-CM

## 2022-04-30 DIAGNOSIS — Z5111 Encounter for antineoplastic chemotherapy: Secondary | ICD-10-CM | POA: Diagnosis not present

## 2022-04-30 DIAGNOSIS — C7931 Secondary malignant neoplasm of brain: Secondary | ICD-10-CM | POA: Diagnosis not present

## 2022-04-30 DIAGNOSIS — C3490 Malignant neoplasm of unspecified part of unspecified bronchus or lung: Secondary | ICD-10-CM

## 2022-04-30 DIAGNOSIS — E119 Type 2 diabetes mellitus without complications: Secondary | ICD-10-CM | POA: Diagnosis not present

## 2022-04-30 DIAGNOSIS — Z79899 Other long term (current) drug therapy: Secondary | ICD-10-CM | POA: Diagnosis not present

## 2022-04-30 DIAGNOSIS — Z7984 Long term (current) use of oral hypoglycemic drugs: Secondary | ICD-10-CM | POA: Diagnosis not present

## 2022-04-30 DIAGNOSIS — Z90710 Acquired absence of both cervix and uterus: Secondary | ICD-10-CM | POA: Diagnosis not present

## 2022-04-30 DIAGNOSIS — Z95828 Presence of other vascular implants and grafts: Secondary | ICD-10-CM

## 2022-04-30 DIAGNOSIS — C3432 Malignant neoplasm of lower lobe, left bronchus or lung: Secondary | ICD-10-CM | POA: Diagnosis not present

## 2022-04-30 LAB — CMP (CANCER CENTER ONLY)
ALT: 59 U/L — ABNORMAL HIGH (ref 0–44)
AST: 22 U/L (ref 15–41)
Albumin: 3.7 g/dL (ref 3.5–5.0)
Alkaline Phosphatase: 53 U/L (ref 38–126)
Anion gap: 10 (ref 5–15)
BUN: 25 mg/dL — ABNORMAL HIGH (ref 8–23)
CO2: 28 mmol/L (ref 22–32)
Calcium: 9.5 mg/dL (ref 8.9–10.3)
Chloride: 99 mmol/L (ref 98–111)
Creatinine: 0.57 mg/dL (ref 0.44–1.00)
GFR, Estimated: >60 mL/min (ref >60)
Glucose, Bld: 294 mg/dL — ABNORMAL HIGH (ref 70–99)
Potassium: 4.4 mmol/L (ref 3.5–5.1)
Sodium: 137 mmol/L (ref 135–145)
Total Bilirubin: 0.7 mg/dL (ref 0.3–1.2)
Total Protein: 6.2 g/dL — ABNORMAL LOW (ref 6.5–8.1)

## 2022-04-30 LAB — CBC WITH DIFFERENTIAL (CANCER CENTER ONLY)
Abs Immature Granulocytes: 0.02 10*3/uL (ref 0.00–0.07)
Basophils Absolute: 0 10*3/uL (ref 0.0–0.1)
Basophils Relative: 0 %
Eosinophils Absolute: 0 10*3/uL (ref 0.0–0.5)
Eosinophils Relative: 0 %
HCT: 32.3 % — ABNORMAL LOW (ref 36.0–46.0)
Hemoglobin: 11.4 g/dL — ABNORMAL LOW (ref 12.0–15.0)
Immature Granulocytes: 0 %
Lymphocytes Relative: 18 %
Lymphs Abs: 0.9 10*3/uL (ref 0.7–4.0)
MCH: 31.7 pg (ref 26.0–34.0)
MCHC: 35.3 g/dL (ref 30.0–36.0)
MCV: 89.7 fL (ref 80.0–100.0)
Monocytes Absolute: 0.2 10*3/uL (ref 0.1–1.0)
Monocytes Relative: 4 %
Neutro Abs: 4 10*3/uL (ref 1.7–7.7)
Neutrophils Relative %: 78 %
Platelet Count: 113 10*3/uL — ABNORMAL LOW (ref 150–400)
RBC: 3.6 MIL/uL — ABNORMAL LOW (ref 3.87–5.11)
RDW: 19.1 % — ABNORMAL HIGH (ref 11.5–15.5)
WBC Count: 5.2 10*3/uL (ref 4.0–10.5)
nRBC: 0 % (ref 0.0–0.2)

## 2022-04-30 MED ORDER — HEPARIN SOD (PORK) LOCK FLUSH 100 UNIT/ML IV SOLN
500.0000 [IU] | Freq: Once | INTRAVENOUS | Status: AC
  Administered 2022-04-30: 500 [IU]

## 2022-04-30 MED ORDER — SODIUM CHLORIDE 0.9% FLUSH
10.0000 mL | Freq: Once | INTRAVENOUS | Status: AC
  Administered 2022-04-30: 10 mL

## 2022-05-01 ENCOUNTER — Ambulatory Visit: Payer: BC Managed Care – PPO | Admitting: Rehabilitative and Restorative Service Providers"

## 2022-05-01 ENCOUNTER — Ambulatory Visit: Payer: BC Managed Care – PPO | Admitting: Speech Pathology

## 2022-05-01 NOTE — Therapy (Signed)
OUTPATIENT OCCUPATIONAL THERAPY ORTHO EVALUATION  Patient Name: Carolyn Hooper MRN: [id number] DOB:08-Jun-1960, 62 y.o., female Today's Date: 05/06/2022  PCP: Evelina Dun, [place]  REFERRING PROVIDER: Ventura Sellers, MD   END OF SESSION:  OT End of Session - 05/06/22 1102     Visit Number 1    Number of Visits 5    Date for OT Re-Evaluation 06/07/22    Authorization Type BCBS    OT Start Time 1103    OT Stop Time 1149    OT Time Calculation (min) 46 min    Activity Tolerance Patient tolerated treatment well;No increased pain;Patient limited by fatigue    Behavior During Therapy [facility] for tasks assessed/performed             Past Medical History:  Diagnosis Date   Abscess    hydroadenitis superativa   Anxiety    DM (diabetes mellitus) ([place])    Hyperinsulinemia    Hypertension 1983   gestational - pre-eclampsia   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[facility])    Neuroma    audiotry neuroma   Seizure ([place])    Vertigo    Past Surgical History:  Procedure Laterality Date   ABDOMINAL HYSTERECTOMY  2006   remaining left ovary   BRONCHIAL BIOPSY  02/19/2022   Procedure: BRONCHIAL BIOPSIES;  Surgeon: Garner Nash, DO;  Location: [facility];  Service: Pulmonary;;   BRONCHIAL BRUSHINGS  02/19/2022   Procedure: BRONCHIAL BRUSHINGS;  Surgeon: Garner Nash, DO;  Location: [facility];  Service: Pulmonary;;   BRONCHIAL NEEDLE ASPIRATION BIOPSY  02/19/2022   Procedure: BRONCHIAL NEEDLE ASPIRATION BIOPSIES;  Surgeon: Garner Nash, DO;  Location: [facility];  Service: Pulmonary;;   IR IMAGING GUIDED PORT INSERTION  03/29/2022   SHOULDER SURGERY Right 1960s   Patient Active Problem List   Diagnosis Date Noted   Port-A-Cath in place 04/24/2022   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 ([place]) 03/07/2022   Focal seizure ([place]) 03/07/2022   Non-small cell lung cancer ([place]) 02/25/2022   Goals of care, counseling/discussion 02/25/2022   Lung mass 02/13/2022    Subarachnoid hemorrhage ([place]) 02/12/2022   Aphasia 02/12/2022   Brain mass 02/12/2022   Leukocytosis 02/12/2022   Mass of lower lobe of left lung 02/12/2022   Unilateral vestibular schwannoma ([place]) 01/08/2021   Refusal of statin medication by patient 07/10/2020   Myalgia due to statin 07/10/2020   Controlled substance agreement signed 05/28/2018   Benzodiazepine dependence ([place]) 05/28/2018   Hyperlipidemia associated with type 2 diabetes mellitus ([place]) 04/11/2016   Morbid obesity ([place]) 04/11/2016   Former smoker 04/11/2016   Hidradenitis suppurativa 04/11/2016   Generalized anxiety disorder 08/19/2013   Uncontrolled type 2 diabetes mellitus with hyperglycemia, without long-term current use of insulin ([place]) 08/19/2013   Hyperinsulinemia 08/19/2013    ONSET DATE: Seizure in Nov 2023  REFERRING DIAG: C34.90,C79.31 (ICD-10-CM) - 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 She also states weakness in b/l hands. She was a Administrator, who played the piano and crocheted and had a lot of dexterity in her earlier years, she states the  difference now is significant to her.  At the very end of the evaluation she also brings up having some bilateral upper extremity weakness that she would like to work on generally as well.   PERTINENT HISTORY: She has history of lung cancer that has metastasized to her brain.  She had a seizure last November, likely caused by cancer and/or chemo/radiation.  She states having poor coordination compared to her baseline, and she is also seeking speech therapy  for speech/cognitive issues.  PRECAUTIONS: Other: active cancer and in treatment ; WEIGHT BEARING RESTRICTIONS: No  PAIN:  Are you having pain? Not now at rest  Rating: 0/10 at rest now, up to 1-2/10 at worst in past week   FALLS: Has patient fallen in last 6 months? No  LIVING ENVIRONMENT: Lives with: she has been staying with her mother and/or son at times (closer to providers) Has following equipment at home: None  PLOF: Independent  PATIENT GOALS: To improve her handwriting in the right dominant hand and coordination in both hands, as well as get a general upper body strengthening program   OBJECTIVE: (All objective assessments below are from initial evaluation on: 05/01/22 unless otherwise specified.)   HAND DOMINANCE: Right   ADLs: Overall ADLs: States decreased ability to write, type, pick up small things like medications.   FUNCTIONAL OUTCOME MEASURES: Eval: Quck DASH 7.5% impairment today  (Higher % Score  =  More Impairment)     UPPER EXTREMITY ROM    Eval: Due to length of evaluation, these things were not addressed in detail, however she appeared to have within functional range of motion in the entire upper extremity, including the hands and thumbs.  OT would recommend checking wrist motion in greater detail as this is often linked to tightness and difficulties in the hands.  Shoulder to Wrist AROM Right TBD Left TBD  Wrist flexion    Wrist extension    (Blank rows = not tested)   Hand AROM Right TBD Left TBD  Full Fist Ability (or Gap to Distal Palmar Crease)    Thumb Opposition  (Kapandji Scale)     (Blank rows = not tested)   UPPER EXTREMITY MMT:    Eval:  NT at eval due length of time to complete evaluation, this should be addressed in the next session as time allows due to complaints of upper body weakness  MMT Right TBD Left TBD  Shoulder flexion    Shoulder abduction    Shoulder adduction    Shoulder extension    Shoulder internal  rotation    Shoulder external rotation    Middle trapezius    Lower trapezius    Elbow flexion    Elbow extension    Forearm supination    Forearm pronation    Wrist flexion    Wrist extension    Wrist ulnar deviation    Wrist radial deviation    (Blank rows = not tested)  HAND FUNCTION: Eval: Observed weakness in affected hand.  Grip strength Right: 49 lbs, Left: 35 lbs   COORDINATION: Eval: Observed coordination impairments with affected b/l hands.  Details unable to be determined at eval due to the length of time it took to complete the eval  Box and Blocks Test: TBD Blocks today (TBD is [facility]); 9 Hole Peg Test Right: TBDsec, Left: TBD sec  SENSATION: Eval:  Light touch intact today, though diminished in Lt hand SF and RF somewhat chronically, details TBD  EDEMA:   Eval:  none significant   COGNITION: Eval: Overall cognitive status: WFL for evaluation today, and she was very pleasant and well-spoken, however she was very verbose and had many details and explanations about her past and current conditions which took up a significant portion of the evaluation time today- limiting the things that could be accomplished.   OBSERVATIONS:   Eval: She had dry flaking and red skin over her left elbow compared to the right elbow.  This does not seem insignificant as she has left elbow complaints of ulnar neuritis.  Additionally she had tremors when trying to hand right and type in front of the OT today, the detailed measure could not be taken due to her detailed explanations today.  Motion appears intact and strength seems at least fair, she states being "the one that everyone asks to open jars at home."  Her endurance may be decreased however.   TODAY'S TREATMENT:  Post-evaluation treatment: OT starts by explaining the tremors and poor coordination can be a side effect of cancer and chemotherapy.  OT cannot guarantee that the skills will improve, but by practicing coordination and various  techniques they may be able to improve or prevent a decline in skills.  For CuTS issues, OT advises her to watch her habits, avoid direct pressure to Lt elbow, moisturize it, keep it at a 90* or straighter angle in the night and possibly wear an elbow support for that purpose at night.  She states understanding.  OT also recommends to continue with all FMS and FNL activities that are safe for her, even if they are a struggle, as this is what will help prevent dysfunction.  OT also prints out the following in-hand manipulations for her to practice every day 2-3 x day to increase FMS and proprioception through b/l hands.    In-Hand Manipulation Skills  Rotation:  Hold pen, try to "twirl" like a baton, keeping parallel (or flat) with surface of table. Try going BOTH directions 10x  Flip:  Hold pen in writing position,  flip in an arch to "erase" position, then back to "write" position. Do not lift hand off table.  10x  Translation:  Open hand palm up,  put an object (bigger is easier (fat marker), smaller is harder (penny)) in your palm and then use your fingers and thumb to move it to the tips of your fingers, pinched against your thumb.  10x  Shift:  Hold pen like a dart, start "shifting" it forward (like putting a key in a key hole) until you are holding it at the base, then shift it backwards until you are holding it at the tip again. 10x     PATIENT EDUCATION: Education details: See tx section above for details  Person educated: Patient Education method: Verbal Instruction, Teach back, Handouts  Education comprehension: States and demonstrates understanding, Additional Education required    HOME EXERCISE PROGRAM: See tx section above for details    GOALS: Goals reviewed with patient? Yes   SHORT TERM GOALS: (STG required if POC>30 days) Target Date: 05/17/22   Pt will demo/state understanding of initial HEP to improve FMS, coordination, and general upper body strength. Goal status:  INITIAL  2.  Pt will complete the remaining portions of evaluation of FMS, strength, as her conversation limited the initial eval.  Goal status: INITIAL   LONG TERM GOALS: Target Date: 06/07/22  Pt will improve coordination skills in b/l UEs, as seen by better score on 9-hole peg and box and blocks  testing to have increased functional ability to carry out fine motor tasks (fasteners, etc.) and more complex, coordinated IADLs (meal prep, sports, etc.).  Goal status: INITIAL - Needs baseline assessment next session   2. More goals to be added TBD, PRN  ASSESSMENT:  CLINICAL IMPRESSION: Patient is a 62 y.o. female who was seen today for occupational therapy evaluation for Lt CuTS, and poor coordination that effects handwriting and limits daily abilities. She will benefit from OP OT to increase quality of life.  At end of session she also brings up being generally weakn in b/l UEs, and would   PERFORMANCE DEFICITS: in functional skills including IADLs, coordination, dexterity, proprioception, sensation, strength, Fine motor control, body mechanics, endurance, and UE functional use, cognitive skills including attention, problem solving, and safety awareness, and psychosocial skills including coping strategies, environmental adaptation, and habits.   IMPAIRMENTS: are limiting patient from IADLs and leisure.   COMORBIDITIES: has co-morbidities such as cancer and a host of other issues related to that  that affects occupational performance. Patient will benefit from skilled OT to address above impairments and improve overall function.  MODIFICATION OR ASSISTANCE TO COMPLETE EVALUATION: No modification of tasks or assist necessary to complete an evaluation.  OT OCCUPATIONAL PROFILE AND HISTORY: Problem focused assessment: Including review of records relating to presenting problem.  CLINICAL DECISION MAKING: LOW - limited treatment options, no task modification necessary  REHAB POTENTIAL:  Good  EVALUATION COMPLEXITY: Low      PLAN:  OT FREQUENCY: 1x/week  OT DURATION: 4 weeks  PLANNED INTERVENTIONS: self care/ADL training, therapeutic exercise, therapeutic activity, neuromuscular re-education, manual therapy, scar mobilization, passive range of motion, splinting, compression bandaging, moist heat, cryotherapy, patient/family education, coping strategies training, and Dry needling  RECOMMENDED OTHER SERVICES: she is seeing SLP for other issues right now  CONSULTED AND AGREED WITH PLAN OF CARE: Patient  PLAN FOR NEXT SESSION:  Measure 2 point discrimination in both hands, check grip strength and pinch strength and coordination of both hands in detail.  Try to keep her on track as she tends to get slightly off topic in conversation.   Benito Mccreedy, OTR/L, CHT 05/06/2022, 1:54 PM

## 2022-05-03 ENCOUNTER — Ambulatory Visit: Payer: BC Managed Care – PPO | Admitting: Family

## 2022-05-03 ENCOUNTER — Other Ambulatory Visit (HOSPITAL_COMMUNITY): Payer: Self-pay

## 2022-05-03 ENCOUNTER — Encounter: Payer: Self-pay | Admitting: Family

## 2022-05-03 VITALS — BP 133/69 | HR 79 | Temp 97.8°F | Ht 68.0 in | Wt 249.0 lb

## 2022-05-03 DIAGNOSIS — D333 Benign neoplasm of cranial nerves: Secondary | ICD-10-CM

## 2022-05-03 DIAGNOSIS — Z6837 Body mass index (BMI) 37.0-37.9, adult: Secondary | ICD-10-CM

## 2022-05-03 DIAGNOSIS — E1169 Type 2 diabetes mellitus with other specified complication: Secondary | ICD-10-CM

## 2022-05-03 DIAGNOSIS — E1165 Type 2 diabetes mellitus with hyperglycemia: Secondary | ICD-10-CM | POA: Diagnosis not present

## 2022-05-03 DIAGNOSIS — M791 Myalgia, unspecified site: Secondary | ICD-10-CM

## 2022-05-03 DIAGNOSIS — L732 Hidradenitis suppurativa: Secondary | ICD-10-CM | POA: Diagnosis not present

## 2022-05-03 DIAGNOSIS — F411 Generalized anxiety disorder: Secondary | ICD-10-CM

## 2022-05-03 DIAGNOSIS — C3490 Malignant neoplasm of unspecified part of unspecified bronchus or lung: Secondary | ICD-10-CM

## 2022-05-03 DIAGNOSIS — Z79899 Other long term (current) drug therapy: Secondary | ICD-10-CM

## 2022-05-03 DIAGNOSIS — Z87891 Personal history of nicotine dependence: Secondary | ICD-10-CM

## 2022-05-03 DIAGNOSIS — F1320 Sedative, hypnotic or anxiolytic dependence, uncomplicated: Secondary | ICD-10-CM

## 2022-05-03 DIAGNOSIS — C7931 Secondary malignant neoplasm of brain: Secondary | ICD-10-CM

## 2022-05-03 DIAGNOSIS — E785 Hyperlipidemia, unspecified: Secondary | ICD-10-CM

## 2022-05-03 DIAGNOSIS — T466X5A Adverse effect of antihyperlipidemic and antiarteriosclerotic drugs, initial encounter: Secondary | ICD-10-CM

## 2022-05-03 LAB — BAYER DCA HB A1C WAIVED: HB A1C (BAYER DCA - WAIVED): 9.7 % — ABNORMAL HIGH (ref 4.8–5.6)

## 2022-05-03 MED ORDER — ALPRAZOLAM 0.5 MG PO TABS
0.5000 mg | ORAL_TABLET | Freq: Two times a day (BID) | ORAL | 5 refills | Status: DC | PRN
  Filled 2022-05-03 – 2022-05-14 (×2): qty 30, 15d supply, fill #0
  Filled 2022-07-11: qty 30, 15d supply, fill #1

## 2022-05-03 NOTE — Progress Notes (Signed)
Subjective:    Patient ID: Carolyn Hooper, female    DOB: 1960/06/10, 62 y.o.   MRN: [id number]  Chief Complaint  Patient presents with   Medical Management of Chronic Issues    Discuss rf of klonopin or change to xanax per patient    Pt presents to the office today for chronic follow up. Pt has hidradenitis suppurative and takes Keflex as needed.   She has [place] lung cancer with mets to brain. She is currently getting chemo 3 weeks. She is starting speech and OT on Monday.     Pt is morbid obese with a BMI 37 and DM and hyperlipidemia.    She is followed by [facility] for vestibular schwannoma and brain mets.   She can not tolerate statin because of myalgia.  Diabetes She presents for her follow-up diabetic visit. She has type 2 diabetes mellitus. Hypoglycemia symptoms include nervousness/anxiousness. Pertinent negatives for diabetes include no blurred vision and no foot paresthesias. Symptoms are stable. Pertinent negatives for diabetic complications include no peripheral neuropathy. Risk factors for coronary artery disease include dyslipidemia, diabetes mellitus, hypertension and sedentary lifestyle. She is following a generally unhealthy diet. Her overall blood glucose range is 140-180 mg/dl.  Hyperlipidemia This is a chronic problem. The current episode started more than 1 year ago. The problem is uncontrolled. Exacerbating diseases include obesity. Current antihyperlipidemic treatment includes diet change. The current treatment provides no improvement of lipids. Risk factors for coronary artery disease include diabetes mellitus, hypertension, a sedentary lifestyle, post-menopausal and dyslipidemia.  Anxiety Presents for follow-up visit. Symptoms include excessive worry, nervous/anxious behavior and restlessness. Symptoms occur occasionally. The severity of symptoms is moderate.        Review of Systems  Eyes:  Negative for blurred vision.  Psychiatric/Behavioral:  The  patient is nervous/anxious.   All other systems reviewed and are negative.      Objective:   Physical Exam Vitals reviewed.  Constitutional:      General: She is not in acute distress.    Appearance: She is well-developed. She is obese.  HENT:     Head: Normocephalic and atraumatic.     Right Ear: Tympanic membrane normal.     Left Ear: Tympanic membrane normal.  Eyes:     Pupils: Pupils are equal, round, and reactive to light.  Neck:     Thyroid: No thyromegaly.  Cardiovascular:     Rate and Rhythm: Normal rate and regular rhythm.     Heart sounds: Normal heart sounds. No murmur heard. Pulmonary:     Effort: Pulmonary effort is normal. No respiratory distress.     Breath sounds: Normal breath sounds. No wheezing.  Abdominal:     General: Bowel sounds are normal. There is no distension.     Palpations: Abdomen is soft. There is no mass.     Tenderness: There is no abdominal tenderness.  Musculoskeletal:        General: No tenderness. Normal range of motion.     Cervical back: Normal range of motion and neck supple.     Right lower leg: Edema (trace) present.     Left lower leg: Edema (trace) present.  Skin:    General: Skin is warm and dry.     Coloration: Skin is pale.  Neurological:     Mental Status: She is alert and oriented to person, place, and time.     Cranial Nerves: No cranial nerve deficit.     Deep Tendon Reflexes: Reflexes are  normal and symmetric.  Psychiatric:        Behavior: Behavior normal.        Thought Content: Thought content normal.        Judgment: Judgment normal.       BP 133/69   Pulse 79   Temp 97.8 F (36.6 C) (Temporal)   Ht 5\' 8"  (1.727 m)   Wt 249 lb (112.9 kg)   HC 67.5" (171.5 cm)   SpO2 97%   BMI 37.86 kg/m      Assessment & Plan:  Carolyn Hooper comes in today with chief complaint of Medical Management of Chronic Issues (Discuss rf of klonopin or change to xanax per patient )   Diagnosis and orders addressed:  1.  Benzodiazepine dependence (HCC) - ALPRAZolam (XANAX) 0.5 MG tablet; Take 1 tablet (0.5 mg total) by mouth 2 (two) times daily as needed for anxiety.  Dispense: 30 tablet; Refill: 5  2. Controlled substance agreement signed - ALPRAZolam (XANAX) 0.5 MG tablet; Take 1 tablet (0.5 mg total) by mouth 2 (two) times daily as needed for anxiety.  Dispense: 30 tablet; Refill: 5  3. Former smoker  4. Generalized anxiety disorder - ALPRAZolam (XANAX) 0.5 MG tablet; Take 1 tablet (0.5 mg total) by mouth 2 (two) times daily as needed for anxiety.  Dispense: 30 tablet; Refill: 5  5. Hidradenitis suppurativa  6. Hyperlipidemia associated with type 2 diabetes mellitus ([place])  7. 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 Morbid obesity ([place])   9. Myalgia due to statin  10. Non-small cell lung cancer, unspecified laterality ([place])  11. Uncontrolled type 2 diabetes mellitus with hyperglycemia, without long-term current use of insulin (HCC) - Bayer DCA Hb A1c Waived  12. 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

## 2022-05-03 NOTE — Patient Instructions (Signed)

## 2022-05-06 ENCOUNTER — Other Ambulatory Visit: Payer: Self-pay

## 2022-05-06 ENCOUNTER — Ambulatory Visit: Payer: BC Managed Care – PPO | Attending: Internal Medicine | Admitting: Speech Pathology

## 2022-05-06 ENCOUNTER — Ambulatory Visit: Payer: BC Managed Care – PPO | Admitting: Rehabilitative and Restorative Service Providers"

## 2022-05-06 ENCOUNTER — Ambulatory Visit
Admission: RE | Admit: 2022-05-06 | Discharge: 2022-05-06 | Disposition: A | Discharge status: Home / Self Care | Payer: BC Managed Care – PPO | Source: Ambulatory Visit | Attending: Radiation Oncology | Admitting: Radiation Oncology

## 2022-05-06 ENCOUNTER — Encounter: Payer: Self-pay | Admitting: Rehabilitative and Restorative Service Providers"

## 2022-05-06 DIAGNOSIS — F985 Adult onset fluency disorder: Secondary | ICD-10-CM | POA: Diagnosis not present

## 2022-05-06 DIAGNOSIS — M6281 Muscle weakness (generalized): Secondary | ICD-10-CM | POA: Diagnosis not present

## 2022-05-06 DIAGNOSIS — C3490 Malignant neoplasm of unspecified part of unspecified bronchus or lung: Secondary | ICD-10-CM | POA: Diagnosis not present

## 2022-05-06 DIAGNOSIS — C7931 Secondary malignant neoplasm of brain: Secondary | ICD-10-CM | POA: Diagnosis not present

## 2022-05-06 DIAGNOSIS — R278 Other lack of coordination: Secondary | ICD-10-CM | POA: Insufficient documentation

## 2022-05-06 DIAGNOSIS — R41841 Cognitive communication deficit: Secondary | ICD-10-CM | POA: Diagnosis not present

## 2022-05-06 NOTE — Progress Notes (Signed)
  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  Intent: Palliative  Radiation Treatment Dates: 03/26/2022 through 03/26/2022 Site Technique Total Dose (Gy) Dose per Fx (Gy) Completed Fx Beam Energies  Brain: SRS Treatment PTV_1_LmidFrontGyrus_18mm PTV_2_Rperiolandic_41mm IMRT 18/18 18 1/1 6XFFF   (as documented in provider EOT note)   The patient was available for call today.  The patient did note fatigue during radiation but has since. The patient did not note hair loss or skin changes in the field of radiation during therapy. The patient is taking dexamethasone. The patient does not have symptoms of  weakness or loss of control of the extremities. The patient does not have symptoms of headache. The patient does not have symptoms of seizure or uncontrolled movement. The patient does not have symptoms of changes in vision. The patient does not have changes in speech. The patient does not have confusion.   The patient was counseled that she will be contacted by our brain and spine navigator to schedule surveillance imaging. The patient was encouraged to call if  she have not received a call to schedule imaging, or if she develop concerns or questions regarding radiation. The patient will also continue to follow up with Dr. Mickeal Skinner in medical oncology.   This concludes the interview.   Leandra Kern, LPN

## 2022-05-07 ENCOUNTER — Inpatient Hospital Stay: Payer: BC Managed Care – PPO

## 2022-05-07 DIAGNOSIS — Z79899 Other long term (current) drug therapy: Secondary | ICD-10-CM | POA: Diagnosis not present

## 2022-05-07 DIAGNOSIS — C3490 Malignant neoplasm of unspecified part of unspecified bronchus or lung: Secondary | ICD-10-CM

## 2022-05-07 DIAGNOSIS — Z95828 Presence of other vascular implants and grafts: Secondary | ICD-10-CM

## 2022-05-07 DIAGNOSIS — Z5112 Encounter for antineoplastic immunotherapy: Secondary | ICD-10-CM | POA: Diagnosis not present

## 2022-05-07 DIAGNOSIS — I10 Essential (primary) hypertension: Secondary | ICD-10-CM | POA: Diagnosis not present

## 2022-05-07 DIAGNOSIS — E119 Type 2 diabetes mellitus without complications: Secondary | ICD-10-CM | POA: Diagnosis not present

## 2022-05-07 DIAGNOSIS — Z7952 Long term (current) use of systemic steroids: Secondary | ICD-10-CM | POA: Diagnosis not present

## 2022-05-07 DIAGNOSIS — C3492 Malignant neoplasm of unspecified part of left bronchus or lung: Secondary | ICD-10-CM

## 2022-05-07 DIAGNOSIS — Z7984 Long term (current) use of oral hypoglycemic drugs: Secondary | ICD-10-CM | POA: Diagnosis not present

## 2022-05-07 DIAGNOSIS — Z90710 Acquired absence of both cervix and uterus: Secondary | ICD-10-CM | POA: Diagnosis not present

## 2022-05-07 DIAGNOSIS — Z5111 Encounter for antineoplastic chemotherapy: Secondary | ICD-10-CM | POA: Diagnosis not present

## 2022-05-07 DIAGNOSIS — C3432 Malignant neoplasm of lower lobe, left bronchus or lung: Secondary | ICD-10-CM | POA: Diagnosis not present

## 2022-05-07 DIAGNOSIS — C7931 Secondary malignant neoplasm of brain: Secondary | ICD-10-CM | POA: Diagnosis not present

## 2022-05-07 LAB — CMP (CANCER CENTER ONLY)
ALT: 70 U/L — ABNORMAL HIGH (ref 0–44)
AST: 22 U/L (ref 15–41)
Albumin: 3.7 g/dL (ref 3.5–5.0)
Alkaline Phosphatase: 51 U/L (ref 38–126)
Anion gap: 10 (ref 5–15)
BUN: 19 mg/dL (ref 8–23)
CO2: 26 mmol/L (ref 22–32)
Calcium: 8.8 mg/dL — ABNORMAL LOW (ref 8.9–10.3)
Chloride: 101 mmol/L (ref 98–111)
Creatinine: 0.53 mg/dL (ref 0.44–1.00)
GFR, Estimated: >60 mL/min (ref >60)
Glucose, Bld: 313 mg/dL — ABNORMAL HIGH (ref 70–99)
Potassium: 4.2 mmol/L (ref 3.5–5.1)
Sodium: 137 mmol/L (ref 135–145)
Total Bilirubin: 0.5 mg/dL (ref 0.3–1.2)
Total Protein: 6 g/dL — ABNORMAL LOW (ref 6.5–8.1)

## 2022-05-07 LAB — CBC WITH DIFFERENTIAL (CANCER CENTER ONLY)
Abs Immature Granulocytes: 0.04 10*3/uL (ref 0.00–0.07)
Basophils Absolute: 0 10*3/uL (ref 0.0–0.1)
Basophils Relative: 0 %
Eosinophils Absolute: 0 10*3/uL (ref 0.0–0.5)
Eosinophils Relative: 0 %
HCT: 31 % — ABNORMAL LOW (ref 36.0–46.0)
Hemoglobin: 10.7 g/dL — ABNORMAL LOW (ref 12.0–15.0)
Immature Granulocytes: 1 %
Lymphocytes Relative: 18 %
Lymphs Abs: 1.2 10*3/uL (ref 0.7–4.0)
MCH: 32.5 pg (ref 26.0–34.0)
MCHC: 34.5 g/dL (ref 30.0–36.0)
MCV: 94.2 fL (ref 80.0–100.0)
Monocytes Absolute: 0.5 10*3/uL (ref 0.1–1.0)
Monocytes Relative: 7 %
Neutro Abs: 4.8 10*3/uL (ref 1.7–7.7)
Neutrophils Relative %: 74 %
Platelet Count: 81 10*3/uL — ABNORMAL LOW (ref 150–400)
RBC: 3.29 MIL/uL — ABNORMAL LOW (ref 3.87–5.11)
RDW: 22 % — ABNORMAL HIGH (ref 11.5–15.5)
WBC Count: 6.5 10*3/uL (ref 4.0–10.5)
nRBC: 1.1 % — ABNORMAL HIGH (ref 0.0–0.2)

## 2022-05-07 MED ORDER — SODIUM CHLORIDE 0.9% FLUSH
10.0000 mL | Freq: Once | INTRAVENOUS | Status: AC
  Administered 2022-05-07: 10 mL

## 2022-05-07 MED ORDER — HEPARIN SOD (PORK) LOCK FLUSH 100 UNIT/ML IV SOLN
500.0000 [IU] | Freq: Once | INTRAVENOUS | Status: AC
  Administered 2022-05-07: 500 [IU]

## 2022-05-07 NOTE — Therapy (Unsigned)
OUTPATIENT SPEECH LANGUAGE PATHOLOGY EVALUATION   Patient Name: Carolyn Hooper MRN: [id number] DOB:1961/03/16, 62 y.o., female Today's Date: 05/07/2022  PCP: [name], FNP REFERRING PROVIDER: Ventura Sellers, MD  END OF SESSION:  End of Session - 05/07/22 1326     Visit Number 1    Number of Visits 9    Date for SLP Re-Evaluation 07/06/22    SLP Start Time 1015    SLP Stop Time  61    SLP Time Calculation (min) 45 min             Past Medical History:  Diagnosis Date   Abscess    hydroadenitis superativa   Anxiety    DM (diabetes mellitus) ([place])    Hyperinsulinemia    Hypertension 1983   gestational - pre-eclampsia   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[facility])    Neuroma    audiotry neuroma   Seizure ([facility])    Vertigo    Past Surgical History:  Procedure Laterality Date   ABDOMINAL HYSTERECTOMY  2006   remaining left ovary   BRONCHIAL BIOPSY  02/19/2022   Procedure: BRONCHIAL BIOPSIES;  Surgeon: Garner Nash, DO;  Location: [place];  Service: Pulmonary;;   BRONCHIAL BRUSHINGS  02/19/2022   Procedure: BRONCHIAL BRUSHINGS;  Surgeon: Garner Nash, DO;  Location: [place];  Service: Pulmonary;;   BRONCHIAL NEEDLE ASPIRATION BIOPSY  02/19/2022   Procedure: BRONCHIAL NEEDLE ASPIRATION BIOPSIES;  Surgeon: Garner Nash, DO;  Location: [place] [facility];  Service: Pulmonary;;   IR IMAGING GUIDED PORT INSERTION  03/29/2022   SHOULDER SURGERY Right 1960s   Patient Active Problem List   Diagnosis Date Noted   Port-A-Cath in place 04/24/2022   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 ([place]) 03/07/2022   Focal 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[place]) 03/07/2022   Non-small cell lung cancer ([place]) 02/25/2022   Goals of care, counseling/discussion 02/25/2022   Lung mass 02/13/2022   Subarachnoid hemorrhage ([place]) 02/12/2022   Aphasia 02/12/2022   Brain mass 02/12/2022   Leukocytosis 02/12/2022   Mass of lower lobe of left lung 02/12/2022   Unilateral vestibular schwannoma  ([place]) 01/08/2021   Refusal of statin medication by patient 07/10/2020   Myalgia due to statin 07/10/2020   Controlled substance agreement signed 05/28/2018   Benzodiazepine dependence ([place]) 05/28/2018   Hyperlipidemia associated with type 2 diabetes mellitus ([place]) 04/11/2016   Morbid obesity ([place]) 04/11/2016   Former smoker 04/11/2016   Hidradenitis suppurativa 04/11/2016   Generalized anxiety disorder 08/19/2013   Uncontrolled type 2 diabetes mellitus with hyperglycemia, without long-term current use of insulin ([place]) 08/19/2013   Hyperinsulinemia 08/19/2013    ONSET DATE: Referred on 04/08/22 (Changes began 11/24)    REFERRING DIAG: C34.90,C79.31 (ICD-10-CM) - 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 Seizure in Nov that has caused speech ang cognitive changes.   PAIN:  Are you having pain? No  FALLS: Has patient fallen in last 6 months?  No  LIVING ENVIRONMENT: Lives with: lives with their family Lives in: House/apartment  PLOF:  Level of assistance: Independent with ADLs Employment: On disability  PATIENT GOALS: To improve word finding and decrease disfluencies   OBJECTIVE:  DIAGNOSTIC FINDINGS:   CT HEAD WO CONTRAST  IMPRESSION: Large area of vasogenic edema in the left frontal/parietal lobe with overlying subarachnoid blood. Second, smaller lesion in the superior right frontal lobe. To gather appearance is concerning for metastatic disease. MRI of the brain with and without contrast is suggested.   Critical Value/emergent results were called by telephone at the time of interpretation on 02/11/2022 at 10:53 pm to provider  [name] TRIFAN , who verbally acknowledged these results.     Electronically Signed   By: Ulyses Jarred M.D.   On: 02/11/2022 22:53  COGNITION: Overall cognitive status: Within functional limits for tasks assessed Areas of impairment:  NA; pt reports slightly decreased processing speed for language. Functional deficits: ^  AUDITORY COMPREHENSION: Overall auditory comprehension: Appears intact YES/NO questions: Appears intact Following directions: Appears intact Conversation: Moderately Complex Interfering components: processing speed Effective technique: extra processing time  READING COMPREHENSION: Impaired: reports impairment at paragraph level  EXPRESSION: verbal  VERBAL EXPRESSION: Level of generative/spontaneous verbalization: conversation Automatic speech: NA Repetition: Appears intact Naming: Further assessment warranted Pragmatics: Appears intact Comments: Pt reports significant anomia; however, none was observed this evaluation. Pt reports she has more instances when feeling fatigued.  Interfering components:  NA Effective technique:  na Non-verbal means of communication: N/A  WRITTEN EXPRESSION: Dominant hand: right Written expression: Not tested  MOTOR SPEECH: Overall motor speech: impaired Level of impairment: Word, Phrase, Sentence, and Conversation Respiration: diaphragmatic/abdominal breathing Phonation: normal Resonance: WFL Articulation: Appears intact Intelligibility: Intelligible Motor planning: Impaired: aware, inconsistent, and (suspect neurogenic stuttering) Motor speech errors: aware and inconsistent Interfering components: hearing loss (vestibular schwannoma) Effective technique: slow rate  ORAL MOTOR EXAMINATION: Overall status: WFL Comments: NA   STANDARDIZED ASSESSMENTS: To complete next session  PATIENT REPORTED OUTCOME MEASURES (PROM): To complete next session   TODAY'S TREATMENT:                                                                                                                                          DATE: NA   PATIENT EDUCATION: Education details: aphasia, cog-comm, neurogenic stuttering Person educated: Patient Education method: Explanation Education comprehension: needs further education   GOALS: Goals reviewed with patient? Yes  SHORT TERM GOALS: Target date: 06/04/22  Pt will recall 3 word finding strategies to use in cases of anomia.  Baseline: Goal status: INITIAL  2.  Pt will recall 3 strategies to use to increase speech  fluency. Baseline:  Goal status: INITIAL  3.  Pt will recall 3 strategies to increase comprehension of written information.  Baseline:  Goal status: INITIAL  4.  Further language assessment Baseline:  Goal status: INITIAL    LONG TERM GOALS: Target date: 07/05/22  Pt will demonstrate use of word finding strategies during instances of anomia in a structured conversation. Baseline:  Goal status: INITIAL  2.  Pt will demonstrate speech fluency strategies during  a 10 minute, structured conversation.  Baseline:  Goal status: INITIAL  3.  Pt will report successful use of reading strategies to increase comprehension of written information.  Baseline:  Goal status: INITIAL   ASSESSMENT:  CLINICAL IMPRESSION: Pt is a 62 yo female who presents to [facility] for evaluation post seizure in November. Pt endorses difficulty with re: vocabulary retrieval, stuttering, and producing coherent/complete sentences.  Patient reports these symptoms are worse in the evening when she is fatigued.  With further clinical interview, patient also reports decreased processing and comprehension while reading. SLP observed patient speech to include prolongations and syllable repetition. Pt also reports she will occasionally "get stuck" on a sound which SLP suspects is a "block". No blocks were observed today. Due to extensive history, SLP was unable to complete assessment of  language skills. No anomia was noted in today's session; however, pt reports it will be worse later in the day. Pt reports her vocabulary was extensive prior to the seizure and the speech difficulty has caused extreme frustration and decreased self-esteem.  Patient reported she has had no other thinking changes other than slightly decreased processing speed.  Decreased processing speed was not noted in today's conversation; however, patient's rate of speech is rapid which seemed to increase her motor speech errors. SLP rec skilled ST services to address speech fluency, expressive language, and receptive language to maximize functional communication and increase self-confidence.    OBJECTIVE IMPAIRMENTS: include expressive language, receptive language, and fluency . These impairments are limiting patient from effectively communicating at home and in community. Factors affecting potential to achieve goals and functional outcome are  NA . Patient will benefit from skilled SLP services to address above impairments and improve overall function.  REHAB POTENTIAL: Good  PLAN:  SLP FREQUENCY: 1-2x/week  SLP DURATION: 8 weeks  PLANNED INTERVENTIONS: Language facilitation, Environmental controls, Cueing hierachy, Internal/external aids, Functional tasks, Multimodal communication approach, SLP instruction and feedback, Compensatory strategies, and Patient/family education    [place], CCC-SLP 05/07/2022, 1:26 PM

## 2022-05-08 ENCOUNTER — Ambulatory Visit: Payer: BC Managed Care – PPO | Admitting: Internal Medicine

## 2022-05-08 ENCOUNTER — Ambulatory Visit: Payer: BC Managed Care – PPO | Admitting: Physician Assistant

## 2022-05-08 ENCOUNTER — Ambulatory Visit: Payer: BC Managed Care – PPO

## 2022-05-08 ENCOUNTER — Other Ambulatory Visit: Payer: BC Managed Care – PPO

## 2022-05-10 ENCOUNTER — Telehealth: Payer: Self-pay | Admitting: Family

## 2022-05-10 ENCOUNTER — Other Ambulatory Visit: Payer: Self-pay | Admitting: Family

## 2022-05-10 ENCOUNTER — Other Ambulatory Visit (HOSPITAL_COMMUNITY): Payer: Self-pay

## 2022-05-10 DIAGNOSIS — E1169 Type 2 diabetes mellitus with other specified complication: Secondary | ICD-10-CM

## 2022-05-10 MED ORDER — METFORMIN HCL 1000 MG PO TABS
1000.0000 mg | ORAL_TABLET | Freq: Two times a day (BID) | ORAL | 1 refills | Status: DC
  Filled 2022-05-10: qty 180, 90d supply, fill #0

## 2022-05-10 NOTE — Telephone Encounter (Signed)
  Prescription Request  05/10/2022  Is this a "Controlled Substance" medicine? no  Have you seen your PCP in the last 2 weeks? Yes 05/03/2022  If YES, route message to pool  -  If NO, patient needs to be scheduled for appointment.  What is the name of the medication or equipment? metFORMIN (GLUCOPHAGE) 1000 MG tablet   Have you contacted your pharmacy to request a refill? yes   Which pharmacy would you like this sent to? [facility]   Patient notified that their request is being sent to the clinical staff for review and that they should receive a response within 2 business days.

## 2022-05-10 NOTE — Telephone Encounter (Signed)
Pt aware I sent her leave request yesterday to the [email or web address] email, received an email back that it was received & will be reviewed and processed w/n 3-4 days & it gave 2 fax #s. Today I sent it to both fax #s.

## 2022-05-13 ENCOUNTER — Encounter (HOSPITAL_COMMUNITY): Payer: Self-pay

## 2022-05-13 ENCOUNTER — Ambulatory Visit (HOSPITAL_COMMUNITY)
Admission: RE | Admit: 2022-05-13 | Discharge: 2022-05-13 | Disposition: A | Discharge status: Home / Self Care | Payer: BC Managed Care – PPO | Source: Ambulatory Visit | Attending: Internal Medicine | Admitting: Internal Medicine

## 2022-05-13 DIAGNOSIS — R918 Other nonspecific abnormal finding of lung field: Secondary | ICD-10-CM | POA: Diagnosis not present

## 2022-05-13 DIAGNOSIS — C3490 Malignant neoplasm of unspecified part of unspecified bronchus or lung: Secondary | ICD-10-CM | POA: Diagnosis not present

## 2022-05-13 DIAGNOSIS — K760 Fatty (change of) liver, not elsewhere classified: Secondary | ICD-10-CM | POA: Diagnosis not present

## 2022-05-13 MED ORDER — IOHEXOL 300 MG/ML  SOLN
100.0000 mL | Freq: Once | INTRAMUSCULAR | Status: AC | PRN
  Administered 2022-05-13: 100 mL via INTRAVENOUS

## 2022-05-13 MED ORDER — SODIUM CHLORIDE (PF) 0.9 % IJ SOLN
INTRAMUSCULAR | Status: AC
  Filled 2022-05-13: qty 50

## 2022-05-13 MED ORDER — HEPARIN SOD (PORK) LOCK FLUSH 100 UNIT/ML IV SOLN
INTRAVENOUS | Status: AC
  Administered 2022-05-13: 500 [IU]
  Filled 2022-05-13: qty 5

## 2022-05-14 ENCOUNTER — Other Ambulatory Visit (HOSPITAL_COMMUNITY): Payer: Self-pay

## 2022-05-14 ENCOUNTER — Encounter: Payer: Self-pay | Admitting: Family

## 2022-05-14 ENCOUNTER — Encounter: Payer: Self-pay | Admitting: Internal Medicine

## 2022-05-14 MED FILL — Dexamethasone Sodium Phosphate Inj 100 MG/10ML: INTRAMUSCULAR | Qty: 1 | Status: AC

## 2022-05-14 MED FILL — Fosaprepitant Dimeglumine For IV Infusion 150 MG (Base Eq): INTRAVENOUS | Qty: 5 | Status: AC

## 2022-05-15 ENCOUNTER — Inpatient Hospital Stay: Payer: BC Managed Care – PPO

## 2022-05-15 ENCOUNTER — Encounter: Payer: Self-pay | Admitting: Internal Medicine

## 2022-05-15 ENCOUNTER — Encounter: Payer: Self-pay | Admitting: Medical Oncology

## 2022-05-15 ENCOUNTER — Other Ambulatory Visit: Payer: BC Managed Care – PPO

## 2022-05-15 ENCOUNTER — Other Ambulatory Visit: Payer: Self-pay

## 2022-05-15 ENCOUNTER — Inpatient Hospital Stay (HOSPITAL_BASED_OUTPATIENT_CLINIC_OR_DEPARTMENT_OTHER): Payer: BC Managed Care – PPO | Admitting: Internal Medicine

## 2022-05-15 VITALS — BP 135/65 | HR 72 | Temp 97.7°F | Resp 17 | Wt 250.3 lb

## 2022-05-15 DIAGNOSIS — Z5112 Encounter for antineoplastic immunotherapy: Secondary | ICD-10-CM | POA: Diagnosis not present

## 2022-05-15 DIAGNOSIS — Z7984 Long term (current) use of oral hypoglycemic drugs: Secondary | ICD-10-CM | POA: Diagnosis not present

## 2022-05-15 DIAGNOSIS — Z90710 Acquired absence of both cervix and uterus: Secondary | ICD-10-CM | POA: Diagnosis not present

## 2022-05-15 DIAGNOSIS — C3492 Malignant neoplasm of unspecified part of left bronchus or lung: Secondary | ICD-10-CM

## 2022-05-15 DIAGNOSIS — C7931 Secondary malignant neoplasm of brain: Secondary | ICD-10-CM | POA: Diagnosis not present

## 2022-05-15 DIAGNOSIS — Z95828 Presence of other vascular implants and grafts: Secondary | ICD-10-CM

## 2022-05-15 DIAGNOSIS — I10 Essential (primary) hypertension: Secondary | ICD-10-CM | POA: Diagnosis not present

## 2022-05-15 DIAGNOSIS — Z7952 Long term (current) use of systemic steroids: Secondary | ICD-10-CM | POA: Diagnosis not present

## 2022-05-15 DIAGNOSIS — C3432 Malignant neoplasm of lower lobe, left bronchus or lung: Secondary | ICD-10-CM | POA: Diagnosis not present

## 2022-05-15 DIAGNOSIS — E119 Type 2 diabetes mellitus without complications: Secondary | ICD-10-CM | POA: Diagnosis not present

## 2022-05-15 DIAGNOSIS — Z79899 Other long term (current) drug therapy: Secondary | ICD-10-CM | POA: Diagnosis not present

## 2022-05-15 DIAGNOSIS — Z5111 Encounter for antineoplastic chemotherapy: Secondary | ICD-10-CM | POA: Diagnosis not present

## 2022-05-15 DIAGNOSIS — C3490 Malignant neoplasm of unspecified part of unspecified bronchus or lung: Secondary | ICD-10-CM

## 2022-05-15 LAB — CMP (CANCER CENTER ONLY)
ALT: 49 U/L — ABNORMAL HIGH (ref 0–44)
AST: 14 U/L — ABNORMAL LOW (ref 15–41)
Albumin: 3.6 g/dL (ref 3.5–5.0)
Alkaline Phosphatase: 49 U/L (ref 38–126)
Anion gap: 10 (ref 5–15)
BUN: 22 mg/dL (ref 8–23)
CO2: 28 mmol/L (ref 22–32)
Calcium: 9.1 mg/dL (ref 8.9–10.3)
Chloride: 101 mmol/L (ref 98–111)
Creatinine: 0.61 mg/dL (ref 0.44–1.00)
GFR, Estimated: >60 mL/min (ref >60)
Glucose, Bld: 220 mg/dL — ABNORMAL HIGH (ref 70–99)
Potassium: 3.8 mmol/L (ref 3.5–5.1)
Sodium: 139 mmol/L (ref 135–145)
Total Bilirubin: 0.7 mg/dL (ref 0.3–1.2)
Total Protein: 5.7 g/dL — ABNORMAL LOW (ref 6.5–8.1)

## 2022-05-15 LAB — CBC WITH DIFFERENTIAL (CANCER CENTER ONLY)
Abs Immature Granulocytes: 0.09 10*3/uL — ABNORMAL HIGH (ref 0.00–0.07)
Basophils Absolute: 0 10*3/uL (ref 0.0–0.1)
Basophils Relative: 1 %
Eosinophils Absolute: 0.1 10*3/uL (ref 0.0–0.5)
Eosinophils Relative: 1 %
HCT: 33.3 % — ABNORMAL LOW (ref 36.0–46.0)
Hemoglobin: 11.4 g/dL — ABNORMAL LOW (ref 12.0–15.0)
Immature Granulocytes: 1 %
Lymphocytes Relative: 22 %
Lymphs Abs: 1.6 10*3/uL (ref 0.7–4.0)
MCH: 33.1 pg (ref 26.0–34.0)
MCHC: 34.2 g/dL (ref 30.0–36.0)
MCV: 96.8 fL (ref 80.0–100.0)
Monocytes Absolute: 0.8 10*3/uL (ref 0.1–1.0)
Monocytes Relative: 10 %
Neutro Abs: 5 10*3/uL (ref 1.7–7.7)
Neutrophils Relative %: 65 %
Platelet Count: 129 10*3/uL — ABNORMAL LOW (ref 150–400)
RBC: 3.44 MIL/uL — ABNORMAL LOW (ref 3.87–5.11)
RDW: 22.5 % — ABNORMAL HIGH (ref 11.5–15.5)
WBC Count: 7.6 10*3/uL (ref 4.0–10.5)
nRBC: 1.1 % — ABNORMAL HIGH (ref 0.0–0.2)

## 2022-05-15 MED ORDER — SODIUM CHLORIDE 0.9 % IV SOLN
739.0000 mg | Freq: Once | INTRAVENOUS | Status: AC
  Administered 2022-05-15: 750 mg via INTRAVENOUS
  Filled 2022-05-15: qty 75

## 2022-05-15 MED ORDER — SODIUM CHLORIDE 0.9 % IV SOLN
500.0000 mg/m2 | Freq: Once | INTRAVENOUS | Status: AC
  Administered 2022-05-15: 1100 mg via INTRAVENOUS
  Filled 2022-05-15: qty 40

## 2022-05-15 MED ORDER — SODIUM CHLORIDE 0.9 % IV SOLN
10.0000 mg | Freq: Once | INTRAVENOUS | Status: AC
  Administered 2022-05-15: 10 mg via INTRAVENOUS
  Filled 2022-05-15: qty 10

## 2022-05-15 MED ORDER — SODIUM CHLORIDE 0.9 % IV SOLN: Freq: Once | INTRAVENOUS | Status: AC

## 2022-05-15 MED ORDER — SODIUM CHLORIDE 0.9 % IV SOLN
150.0000 mg | Freq: Once | INTRAVENOUS | Status: AC
  Administered 2022-05-15: 150 mg via INTRAVENOUS
  Filled 2022-05-15: qty 150

## 2022-05-15 MED ORDER — PALONOSETRON HCL INJECTION 0.25 MG/5ML
0.2500 mg | Freq: Once | INTRAVENOUS | Status: AC
  Administered 2022-05-15: 0.25 mg via INTRAVENOUS
  Filled 2022-05-15: qty 5

## 2022-05-15 MED ORDER — SODIUM CHLORIDE 0.9% FLUSH: 10.0000 mL | INTRAVENOUS | Status: DC | PRN

## 2022-05-15 MED ORDER — SODIUM CHLORIDE 0.9% FLUSH
10.0000 mL | Freq: Once | INTRAVENOUS | Status: AC
  Administered 2022-05-15: 10 mL

## 2022-05-15 MED ORDER — HEPARIN SOD (PORK) LOCK FLUSH 100 UNIT/ML IV SOLN: 500.0000 [IU] | Freq: Once | INTRAVENOUS | Status: DC | PRN

## 2022-05-15 MED ORDER — SODIUM CHLORIDE 0.9 % IV SOLN
200.0000 mg | Freq: Once | INTRAVENOUS | Status: AC
  Administered 2022-05-15: 200 mg via INTRAVENOUS
  Filled 2022-05-15: qty 8

## 2022-05-15 NOTE — Progress Notes (Signed)
[facility] [phone]   [phone]  OFFICE PROGRESS NOTE  [name], [place] [address]  DIAGNOSIS: Stage IV (T3, N0, M1b) non-small cell lung cancer, adenosquamous carcinoma presented with large medial left lower lobe lung mass with bilateral pulmonary nodules and metastatic disease to the brain diagnosed in November 2023   Detected Alteration(s) / Biomarker(s) Associated FDA-approved therapies Clinical Trial Availability % cfDNA or Amplification  ATM [id number] approved in other indication Olaparib, Talazoparib Yes 0.5%  ATM R337H ND None None  . PD-L1 expression is negative.  PRIOR THERAPY: Status post SRS to brain metastasis completed March 26, 2022    CURRENT THERAPY: Systemic chemotherapy with carboplatin for AUC of 5, Alimta 500 Mg/M2 and Keytruda 200 Mg IV every 3 weeks status post 3 cycles. First dose was given on March 06, 2022.   INTERVAL HISTORY: Carolyn Hooper 62 y.o. female returns to clinic today for follow-up visit accompanied by her son.  The patient is feeling fine today with no concerning complaints except for the baseline fatigue and shortness of breath with exertion.  She also had some dizzy spells while standing and looking upward.  She denied having any current chest pain, cough or hemoptysis.  She has no nausea, vomiting, diarrhea or constipation.  She has no headache or visual changes.  She denied having any fever or chills.  She has no recent weight loss or night sweats.  She has been tolerating her treatment with chemotherapy fairly well.  She had repeat CT scan of the chest, abdomen and pelvis performed recently and she is here for evaluation and discussion of her scan results.  MEDICAL HISTORY: Past Medical History:  Diagnosis Date   Abscess    hydroadenitis superativa   Anxiety    DM (diabetes mellitus) ([place])    Hyperinsulinemia    Hypertension 1983   gestational -  pre-eclampsia   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[facility])    Neuroma    audiotry neuroma   Seizure ([place])    Vertigo     ALLERGIES:  is allergic to adhesive [tape] and statins.  MEDICATIONS:  Current Outpatient Medications  Medication Sig Dispense Refill   albuterol (VENTOLIN HFA) 108 (90 Base) MCG/ACT inhaler Inhale 2 puffs into the lungs every 6 (six) hours as needed. (Patient not taking: Reported on 05/06/2022) 18 g 2   ALPRAZolam (XANAX) 0.5 MG tablet Take 1 tablet (0.5 mg total) by mouth 2 (two) times daily as needed for anxiety. 30 tablet 5   clonazePAM (KLONOPIN) 0.5 MG tablet Take 0.5 tablets (0.25 mg total) by mouth at bedtime. For anxiety (Patient taking differently: Take 0.25 mg by mouth daily as needed for anxiety (and vertigo).) 30 tablet 2   dexamethasone (DECADRON) 1 MG tablet Take 3 tablets (3 mg total) by mouth daily with breakfast. 90 tablet 1   folic acid (FOLVITE) 1 MG tablet Take 1 tablet (1 mg total) by mouth daily. 30 tablet 2   levETIRAcetam (KEPPRA) 1000 MG tablet Take 1 tablet (1,000 mg total) by mouth 2 (two) times daily. 60 tablet 2   lidocaine-prilocaine (EMLA) cream Apply to port prior to access 30 g 0   magic mouthwash (nystatin, diphenhydrAMINE, alum & mag hydroxide) suspension mixture Take 5 mLs by mouth 3 (three) times daily as needed for mouth pain. 240 mL 1   metFORMIN (GLUCOPHAGE) 1000 MG tablet Take 1 tablet (1,000 mg total) by mouth 2 (two)  times daily. 180 tablet 1   metFORMIN (GLUCOPHAGE) 850 MG tablet Take 425-850 mg by mouth See admin instructions. 425 mg - 850 mg with meals, depending on how much carbs and sugars Pt eats     nystatin (MYCOSTATIN) 100000 UNIT/ML suspension TAKE 5 ML BY MOUTH  4 TIMES DAILY AS NEEDED 60 mL 0   ondansetron (ZOFRAN) 4 MG tablet Take 1 tablet (4 mg total) by mouth every 6 (six) hours as needed for nausea. 20 tablet 0   prochlorperazine (COMPAZINE) 10 MG tablet Take 1 tablet (10 mg total) by mouth every 6 (six) hours as  needed. 30 tablet 2   No current facility-administered medications for this visit.    SURGICAL HISTORY:  Past Surgical History:  Procedure Laterality Date   ABDOMINAL HYSTERECTOMY  2006   remaining left ovary   BRONCHIAL BIOPSY  02/19/2022   Procedure: BRONCHIAL BIOPSIES;  Surgeon: Garner Nash, DO;  Location: [facility];  Service: Pulmonary;;   BRONCHIAL BRUSHINGS  02/19/2022   Procedure: BRONCHIAL BRUSHINGS;  Surgeon: Garner Nash, DO;  Location: [facility];  Service: Pulmonary;;   BRONCHIAL NEEDLE ASPIRATION BIOPSY  02/19/2022   Procedure: BRONCHIAL NEEDLE ASPIRATION BIOPSIES;  Surgeon: Garner Nash, DO;  Location: [facility];  Service: Pulmonary;;   IR IMAGING GUIDED PORT INSERTION  03/29/2022   SHOULDER SURGERY Right 1960s    REVIEW OF SYSTEMS:  Constitutional: positive for fatigue Eyes: negative Ears, nose, mouth, throat, and face: negative Respiratory: positive for dyspnea on exertion Cardiovascular: negative Gastrointestinal: negative Genitourinary:negative Integument/breast: negative Hematologic/lymphatic: negative Musculoskeletal:negative Neurological: negative Behavioral/Psych: negative Endocrine: negative Allergic/Immunologic: negative   PHYSICAL EXAMINATION: General appearance: alert, cooperative, fatigued, and no distress Head: Normocephalic, without obvious abnormality, atraumatic Neck: no adenopathy, no JVD, supple, symmetrical, trachea midline, and thyroid not enlarged, symmetric, no tenderness/mass/nodules Lymph nodes: Cervical, supraclavicular, and axillary nodes normal. Resp: clear to auscultation bilaterally Back: symmetric, no curvature. ROM normal. No CVA tenderness. Cardio: regular rate and rhythm, S1, S2 normal, no murmur, click, rub or gallop GI: soft, non-tender; bowel sounds normal; no masses,  no organomegaly Extremities: extremities normal, atraumatic, no cyanosis or edema Neurologic: Alert and oriented X 3, normal strength and  tone. Normal symmetric reflexes. Normal coordination and gait  ECOG PERFORMANCE STATUS: 1 - Symptomatic but completely ambulatory  Blood pressure 135/65, pulse 72, temperature 97.7 F (36.5 C), temperature source Oral, resp. rate 17, weight 250 lb 5 oz (113.5 kg), SpO2 95 %.  LABORATORY DATA: Lab Results  Component Value Date   WBC 7.6 05/15/2022   HGB 11.4 (L) 05/15/2022   HCT 33.3 (L) 05/15/2022   MCV 96.8 05/15/2022   PLT 129 (L) 05/15/2022      Chemistry      Component Value Date/Time   NA 137 05/07/2022 1435   NA 141 11/01/2021 1009   K 4.2 05/07/2022 1435   CL 101 05/07/2022 1435   CO2 26 05/07/2022 1435   BUN 19 05/07/2022 1435   BUN 13 11/01/2021 1009   CREATININE 0.53 05/07/2022 1435      Component Value Date/Time   CALCIUM 8.8 (L) 05/07/2022 1435   ALKPHOS 51 05/07/2022 1435   AST 22 05/07/2022 1435   ALT 70 (H) 05/07/2022 1435   BILITOT 0.5 05/07/2022 1435       RADIOGRAPHIC STUDIES: CT Chest W Contrast  Result Date: 05/14/2022 CLINICAL DATA:  Staging non-small-cell lung cancer. Ongoing chemotherapy. Finished radiation November 2023. * Tracking Code: BO * EXAM: CT CHEST, ABDOMEN, AND  PELVIS WITH CONTRAST TECHNIQUE: Multidetector CT imaging of the chest, abdomen and pelvis was performed following the standard protocol during bolus administration of intravenous contrast. RADIATION DOSE REDUCTION: This exam was performed according to the departmental dose-optimization program which includes automated exposure control, adjustment of the mA and/or kV according to patient size and/or use of iterative reconstruction technique. CONTRAST:  166mL OMNIPAQUE IOHEXOL 300 MG/ML  SOLN COMPARISON:  CT 02/11/2022.  PET-CT 03/06/2022 FINDINGS: CT CHEST FINDINGS Cardiovascular: Right upper chest port. The port is accessed. Heart is nonenlarged. No pericardial effusion. Coronary artery calcifications are seen. Please correlate for other coronary risk factors. Thoracic aorta has a  normal course and caliber with scattered calcified plaque. Prominent calcified plaque also seen along the left subclavian artery origin. Mediastinum/Nodes: No specific abnormal lymph node enlargement identified in the axillary region, hilum or mediastinum. Few small hilar and mediastinal nodes are seen, nonpathologic by size criteria at under 1 cm size in short axis. These are similar to the prior CT scan contrast. Lungs/Pleura: Lungs are without consolidation, pneumothorax or effusion. There is some scattered areas of scarring atelectatic change. Multiple lung nodules are once again identified there are of 6 right-sided lung nodules. Specific lesions will be followed for continuity. This includes the dominant right upper lobe nodule laterally which on the prior CT scan measured 9 mm and today on series 4, image 52 measures 9 by 8 mm. In the left lung there is a confluent masslike area in the medial left lower lobe which previously on PET-CT and measured 5.2 x 4.4 cm and standard CT 5.4 x 4.1 cm., today on series 4, image 106 measures 5.1 by 4.5 cm. Small nodule also seen such as superior segment left lower lobe on image 80 and image 69 which are stable. Tiny left upper lobe nodule on image 31 is also stable. No new lung nodules. Musculoskeletal: Scattered degenerative changes noted along the spine. Bridging osteophytes and syndesmophytes. CT ABDOMEN PELVIS FINDINGS Hepatobiliary: Fatty liver infiltration. There is a small low-attenuation lesion identified in segment 4 near the margin of the portal vein measuring 11 mm on series 2, image 61 which was not clearly seen previously. Possibilities include a new nodule versus more focal fat deposition. Patent portal vein. Gallbladder is dilated. There is a large stone in the neck of the gallbladder measuring 4.1 cm. There is also presumed sludge in the gallbladder which was seen previously. The appearance of the gallbladder is unchanged from prior. Pancreas: Unremarkable.  No pancreatic ductal dilatation or surrounding inflammatory changes. Spleen: Spleen measures 13.6 cm in longitudinal length. Preserved enhancement. Adrenals/Urinary Tract: Adrenal glands are preserved. No enhancing renal mass or collecting system dilatation. Ureters have normal course and caliber extending down to the bladder. Preserved contours of the urinary bladder. There are low-lying pelvic floor structures. Please correlate with pelvic prolapse. Stomach/Bowel: Moderate scattered colonic stool. Few scattered left-sided colonic diverticula. No obstruction, free air. Moderate debris in the stomach. Small bowel is nondilated. Vascular/Lymphatic: Normal caliber aorta and IVC with scattered vascular calcifications. No specific abnormal lymph node enlargement identified in the abdomen and pelvis. A few small nodes are seen, not pathologic by size criteria. Reproductive: Status post hysterectomy. No adnexal masses. Other: Anasarca.  Diffuse muscle atrophy.  No ascites. Musculoskeletal: Scattered degenerative changes of the spine and pelvis. There is a sclerotic lesion along the lateral margin of the left femoral head, unchanged from previous. IMPRESSION: CT CHEST: 1. Stable bilateral pulmonary nodules including the large mass in the left lower  lobe. 2. No new mass lesion, fluid collection or lymph node enlargement in the thorax. CT ABDOMEN AND PELVIS: 1. 11 mm low-attenuation lesion in segment 4 of the liver was not clearly seen previously. Possibilities include a new nodule versus more focal fat deposition. Underlying diffuse fatty liver infiltration and mild splenic enlargement. Etiology could be confirmed with dynamic liver MRI as clinically appropriate 2. Stable dilated gallbladder with sludge and a large 4 cm stone. The stone is towards the neck. 3. Low-lying pelvic floor structures, please correlate for pelvic prolapse Aortic Atherosclerosis (ICD10-I70.0). Electronically Signed   By: Jill Side M.D.   On:  05/14/2022 14:16   CT Abdomen Pelvis W Contrast  Result Date: 05/14/2022 CLINICAL DATA:  Staging non-small-cell lung cancer. Ongoing chemotherapy. Finished radiation November 2023. * Tracking Code: BO * EXAM: CT CHEST, ABDOMEN, AND PELVIS WITH CONTRAST TECHNIQUE: Multidetector CT imaging of the chest, abdomen and pelvis was performed following the standard protocol during bolus administration of intravenous contrast. RADIATION DOSE REDUCTION: This exam was performed according to the departmental dose-optimization program which includes automated exposure control, adjustment of the mA and/or kV according to patient size and/or use of iterative reconstruction technique. CONTRAST:  128mL OMNIPAQUE IOHEXOL 300 MG/ML  SOLN COMPARISON:  CT 02/11/2022.  PET-CT 03/06/2022 FINDINGS: CT CHEST FINDINGS Cardiovascular: Right upper chest port. The port is accessed. Heart is nonenlarged. No pericardial effusion. Coronary artery calcifications are seen. Please correlate for other coronary risk factors. Thoracic aorta has a normal course and caliber with scattered calcified plaque. Prominent calcified plaque also seen along the left subclavian artery origin. Mediastinum/Nodes: No specific abnormal lymph node enlargement identified in the axillary region, hilum or mediastinum. Few small hilar and mediastinal nodes are seen, nonpathologic by size criteria at under 1 cm size in short axis. These are similar to the prior CT scan contrast. Lungs/Pleura: Lungs are without consolidation, pneumothorax or effusion. There is some scattered areas of scarring atelectatic change. Multiple lung nodules are once again identified there are of 6 right-sided lung nodules. Specific lesions will be followed for continuity. This includes the dominant right upper lobe nodule laterally which on the prior CT scan measured 9 mm and today on series 4, image 52 measures 9 by 8 mm. In the left lung there is a confluent masslike area in the medial left  lower lobe which previously on PET-CT and measured 5.2 x 4.4 cm and standard CT 5.4 x 4.1 cm., today on series 4, image 106 measures 5.1 by 4.5 cm. Small nodule also seen such as superior segment left lower lobe on image 80 and image 69 which are stable. Tiny left upper lobe nodule on image 31 is also stable. No new lung nodules. Musculoskeletal: Scattered degenerative changes noted along the spine. Bridging osteophytes and syndesmophytes. CT ABDOMEN PELVIS FINDINGS Hepatobiliary: Fatty liver infiltration. There is a small low-attenuation lesion identified in segment 4 near the margin of the portal vein measuring 11 mm on series 2, image 61 which was not clearly seen previously. Possibilities include a new nodule versus more focal fat deposition. Patent portal vein. Gallbladder is dilated. There is a large stone in the neck of the gallbladder measuring 4.1 cm. There is also presumed sludge in the gallbladder which was seen previously. The appearance of the gallbladder is unchanged from prior. Pancreas: Unremarkable. No pancreatic ductal dilatation or surrounding inflammatory changes. Spleen: Spleen measures 13.6 cm in longitudinal length. Preserved enhancement. Adrenals/Urinary Tract: Adrenal glands are preserved. No enhancing renal mass or collecting  system dilatation. Ureters have normal course and caliber extending down to the bladder. Preserved contours of the urinary bladder. There are low-lying pelvic floor structures. Please correlate with pelvic prolapse. Stomach/Bowel: Moderate scattered colonic stool. Few scattered left-sided colonic diverticula. No obstruction, free air. Moderate debris in the stomach. Small bowel is nondilated. Vascular/Lymphatic: Normal caliber aorta and IVC with scattered vascular calcifications. No specific abnormal lymph node enlargement identified in the abdomen and pelvis. A few small nodes are seen, not pathologic by size criteria. Reproductive: Status post hysterectomy. No  adnexal masses. Other: Anasarca.  Diffuse muscle atrophy.  No ascites. Musculoskeletal: Scattered degenerative changes of the spine and pelvis. There is a sclerotic lesion along the lateral margin of the left femoral head, unchanged from previous. IMPRESSION: CT CHEST: 1. Stable bilateral pulmonary nodules including the large mass in the left lower lobe. 2. No new mass lesion, fluid collection or lymph node enlargement in the thorax. CT ABDOMEN AND PELVIS: 1. 11 mm low-attenuation lesion in segment 4 of the liver was not clearly seen previously. Possibilities include a new nodule versus more focal fat deposition. Underlying diffuse fatty liver infiltration and mild splenic enlargement. Etiology could be confirmed with dynamic liver MRI as clinically appropriate 2. Stable dilated gallbladder with sludge and a large 4 cm stone. The stone is towards the neck. 3. Low-lying pelvic floor structures, please correlate for pelvic prolapse Aortic Atherosclerosis (ICD10-I70.0). Electronically Signed   By: Jill Side M.D.   On: 05/14/2022 14:16    ASSESSMENT AND PLAN: This is a very pleasant 62 years old white female with Stage IV (T3, N0, M1b) non-small cell lung cancer, adenosquamous carcinoma presented with large medial left lower lobe lung mass with bilateral pulmonary nodules and metastatic disease to the brain diagnosed in November 2023 the patient has no actionable mutations and has negative PD-L1 expression. 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 She is currently undergoing treatment with systemic chemotherapy with carboplatin for AUC of 5, Alimta 500 Mg/M2 and Keytruda 200 Mg IV every 3 weeks status post 3 cycles.   The patient has been tolerating this treatment well with no concerning adverse effects except for the mild fatigue. She had repeat CT scan of the chest, abdomen and pelvis performed recently.  I personally and independently reviewed the scan images and discussed the result and showed the images  to the patient and her son today. Her scan showed stable to slight improvement of the pulmonary nodule and masses.  There was 11 mm low-attenuation lesion in segment 4 of the liver suspicious for new nodule versus more focal fat deposition and will continue to monitor this closely. I recommended for the patient to continue her current treatment with the same regimen and we will repeat her imaging studies after cycle 6. She will proceed with cycle #4 today as planned.  Starting from cycle #5 she will be on maintenance treatment with Alimta and Keytruda every 3 weeks. The patient will come back for follow-up visit in 3 weeks for evaluation before the next cycle of her treatment. She was advised to call immediately if she has any concerning symptoms in the interval. The patient voices understanding of current disease status and treatment options and is in agreement with the current care plan.  All questions were answered. The patient knows to call the clinic with any problems, questions or concerns. We can certainly see the patient much sooner if necessary.  The total time spent in the appointment was 30 minutes.  Disclaimer: This note was dictated with voice recognition software. Similar sounding words can inadvertently be transcribed and may not be corrected upon review.

## 2022-05-15 NOTE — Progress Notes (Signed)
Patient seen by MD today  Vitals are within treatment parameters.  Labs reviewed: and are within treatment parameters.  Per physician team, patient is ready for treatment and there are NO modifications to the treatment plan.

## 2022-05-15 NOTE — Patient Instructions (Signed)
[facility] COCKTA[facility] 27% JUICE  [facility]  Discharge Instructions: Thank you for choosing [facility] to provide your oncology and hematology care.   If you have a lab appointment with the Cancer Center, please go directly to the Cancer Center and check in at the registration area.   Wear comfortable clothing and clothing appropriate for easy access to any Portacath or PICC line.   We strive to give you quality time with your provider. You may need to reschedule your appointment if you arrive late (15 or more minutes).  Arriving late affects you and other patients whose appointments are after yours.  Also, if you miss three or more appointments without notifying the office, you may be dismissed from the clinic at the provider's discretion.      For prescription refill requests, have your pharmacy contact our office and allow 72 hours for refills to be completed.    Today you received the following chemotherapy and/or immunotherapy agents: pembrolizumab, pemetrexed, and carboplatin      To help prevent nausea and vomiting after your treatment, we encourage you to take your nausea medication as directed.  BELOW ARE SYMPTOMS THAT SHOULD BE REPORTED IMMEDIATELY: *FEVER GREATER THAN 100.4 F (38 C) OR HIGHER *CHILLS OR SWEATING *NAUSEA AND VOMITING THAT IS NOT CONTROLLED WITH YOUR NAUSEA MEDICATION *UNUSUAL SHORTNESS OF BREATH *UNUSUAL BRUISING OR BLEEDING *URINARY PROBLEMS (pain or burning when urinating, or frequent urination) *BOWEL PROBLEMS (unusual diarrhea, constipation, pain near the anus) TENDERNESS IN MOUTH AND THROAT WITH OR WITHOUT PRESENCE OF ULCERS (sore throat, sores in mouth, or a toothache) UNUSUAL RASH, SWELLING OR PAIN  UNUSUAL VAGINAL DISCHARGE OR ITCHING   Items with * indicate a potential emergency and should be followed up as soon as possible or go to the Emergency Department if any problems should  occur.  Please show the CHEMOTHERAPY ALERT CARD or IMMUNOTHERAPY ALERT CARD at check-in to the Emergency Department and triage nurse.  Should you have questions after your visit or need to cancel or reschedule your appointment, please contact [facility]  Dept: [phone]  and follow the prompts.  Office hours are 8:00 a.m. to 4:30 p.m. Monday - Friday. Please note that voicemails left after 4:00 p.m. may not be returned until the following business day.  We are closed weekends and major holidays. You have access to a nurse at all times for urgent questions. Please call the main number to the clinic Dept: [phone] and follow the prompts.   For any non-urgent questions, you may also contact your provider using MyChart. We now offer e-Visits for anyone 18 and older to request care online for non-urgent symptoms. For details visit [email or web address].   Also download the MyChart app! Go to the app store, search "MyChart", open the app, select [facility], and log in with your MyChart username and password.   

## 2022-05-15 NOTE — Therapy (Incomplete)
OUTPATIENT OCCUPATIONAL THERAPY ORTHO EVALUATION  Patient Name: Carolyn Hooper MRN: [id number] DOB:1960-06-26, 63 y.o., female Today's Date: 05/15/2022  PCP: Evelina Dun, [place]  REFERRING PROVIDER: Ventura Sellers, MD   END OF SESSION:    Past Medical History:  Diagnosis Date   Abscess    hydroadenitis superativa   Anxiety    DM (diabetes mellitus) ([place])    Hyperinsulinemia    Hypertension 1983   gestational - pre-eclampsia   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[facility])    Neuroma    audiotry neuroma   Seizure ([place])    Vertigo    Past Surgical History:  Procedure Laterality Date   ABDOMINAL HYSTERECTOMY  2006   remaining left ovary   BRONCHIAL BIOPSY  02/19/2022   Procedure: BRONCHIAL BIOPSIES;  Surgeon: Garner Nash, DO;  Location: [place] [facility];  Service: Pulmonary;;   BRONCHIAL BRUSHINGS  02/19/2022   Procedure: BRONCHIAL BRUSHINGS;  Surgeon: Garner Nash, DO;  Location: [place] [facility];  Service: Pulmonary;;   BRONCHIAL NEEDLE ASPIRATION BIOPSY  02/19/2022   Procedure: BRONCHIAL NEEDLE ASPIRATION BIOPSIES;  Surgeon: Garner Nash, DO;  Location: [place];  Service: Pulmonary;;   IR IMAGING GUIDED PORT INSERTION  03/29/2022   SHOULDER SURGERY Right 1960s   Patient Active Problem List   Diagnosis Date Noted   Port-A-Cath in place 04/24/2022   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 ([place]) 03/07/2022   Focal seizure ([place]) 03/07/2022   Non-small cell lung cancer ([place]) 02/25/2022   Goals of care, counseling/discussion 02/25/2022   Lung mass 02/13/2022   Subarachnoid hemorrhage ([place]) 02/12/2022   Aphasia 02/12/2022   Brain mass 02/12/2022   Leukocytosis 02/12/2022   Mass of lower lobe of left lung 02/12/2022   Unilateral vestibular schwannoma ([place]) 01/08/2021   Refusal of statin medication by patient 07/10/2020   Myalgia due to statin 07/10/2020   Controlled substance agreement signed 05/28/2018   Benzodiazepine dependence ([place]) 05/28/2018    Hyperlipidemia associated with type 2 diabetes mellitus ([place]) 04/11/2016   Morbid obesity ([place]) 04/11/2016   Former smoker 04/11/2016   Hidradenitis suppurativa 04/11/2016   Generalized anxiety disorder 08/19/2013   Uncontrolled type 2 diabetes mellitus with hyperglycemia, without long-term current use of insulin ([place]) 08/19/2013   Hyperinsulinemia 08/19/2013    ONSET DATE: Seizure in Nov 2023  REFERRING DIAG: C34.90,C79.31 (ICD-10-CM) - 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  Rationale for Evaluation and Treatment: Rehabilitation  SUBJECTIVE:   SUBJECTIVE STATEMENT: She states going through chemo and radiation for lung and brain CA and fears related to this.  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 She also states weakness in b/l hands. 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  At the very end of the evaluation she also brings up having some bilateral upper extremity weakness that she would like to work on generally as well.   PERTINENT HISTORY: She has history of lung cancer that has metastasized to her brain.  She had a seizure last November, likely caused by cancer and/or chemo/radiation.  She states having poor coordination compared to her baseline, and she is also seeking speech therapy for speech/cognitive issues.  PRECAUTIONS: Other: active cancer and in treatment ; WEIGHT BEARING RESTRICTIONS: No  PAIN:  Are you having pain? Not now at rest  Rating: 0/10 at rest now, up to 1-2/10 at worst in past week   FALLS: Has patient fallen in last 6 months? No  LIVING ENVIRONMENT: Lives with: she has been staying with her mother and/or son at times (closer to providers) Has following equipment at home: None  PLOF:  Independent  PATIENT GOALS: To improve her handwriting in the right dominant hand and coordination in both hands, as well as get a general upper body strengthening program   OBJECTIVE: (All objective assessments below are from initial evaluation on: 05/01/22 unless otherwise specified.)   HAND DOMINANCE: Right   ADLs: Overall ADLs: States decreased ability to write, type, pick up small things like medications.   FUNCTIONAL OUTCOME MEASURES: Eval: Quck DASH 7.5% impairment today  (Higher % Score  =  More Impairment)     UPPER EXTREMITY ROM    Eval: Due to length of evaluation, these things were not addressed in detail, however she appeared to have within functional range of motion in the entire upper extremity, including the hands and thumbs.  OT would recommend checking wrist motion in greater detail as this is often linked to tightness and difficulties in the hands.  Shoulder to Wrist AROM Right TBD Left TBD  Wrist flexion    Wrist extension    (Blank rows = not tested)   Hand AROM Right TBD Left TBD  Full Fist Ability (or Gap to Distal Palmar Crease)    Thumb Opposition  (Kapandji Scale)     (Blank rows = not tested)   UPPER EXTREMITY MMT:    Eval:  NT at eval due length of time to complete evaluation, this should be addressed in the next session as time allows due to complaints of upper body weakness  MMT Right TBD Left TBD  Shoulder flexion    Shoulder abduction    Shoulder adduction    Shoulder extension    Shoulder internal rotation    Shoulder external rotation    Middle trapezius    Lower trapezius    Elbow flexion    Elbow extension    Forearm supination    Forearm pronation    Wrist flexion    Wrist extension    Wrist ulnar deviation    Wrist radial deviation    (Blank rows = not tested)  HAND FUNCTION: Eval: Observed weakness in affected hand.  Grip strength Right: 49 lbs, Left: 35 lbs   COORDINATION: Eval: Observed coordination impairments  with affected b/l hands.  Details unable to be determined at eval due to the length of time it took to complete the eval  Box and Blocks Test: TBD Blocks today (TBD is [facility]); 9 Hole Peg Test Right: TBDsec, Left: TBD sec  SENSATION: Eval:  Light touch intact today, though diminished in Lt hand SF and RF somewhat chronically, details TBD  EDEMA:   Eval: none significant   COGNITION: Eval: Overall cognitive status: WFL for evaluation today, and she was very pleasant and well-spoken, however she was very verbose and had many details and explanations about her past and current conditions which took up a significant portion of the evaluation time today- limiting the things that could be accomplished.   OBSERVATIONS:   Eval: She had dry flaking and red skin over her left elbow compared to the right elbow.  This does not seem insignificant as she has left elbow complaints of ulnar neuritis.  Additionally she had tremors  when trying to hand right and type in front of the OT today, the detailed measure could not be taken due to her detailed explanations today.  Motion appears intact and strength seems at least fair, she states being "the one that everyone asks to open jars at home."  Her endurance may be decreased however.   TODAY'S TREATMENT:  Post-evaluation treatment: OT starts by explaining the tremors and poor coordination can be a side effect of cancer and chemotherapy.  OT cannot guarantee that the skills will improve, but by practicing coordination and various techniques they may be able to improve or prevent a decline in skills.  For CuTS issues, OT advises her to watch her habits, avoid direct pressure to Lt elbow, moisturize it, keep it at a 90* or straighter angle in the night and possibly wear an elbow support for that purpose at night.  She states understanding.  OT also recommends to continue with all FMS and FNL activities that are safe for her, even if they are a struggle, as this is what  will help prevent dysfunction.  OT also prints out the following in-hand manipulations for her to practice every day 2-3 x day to increase FMS and proprioception through b/l hands.    In-Hand Manipulation Skills  Rotation:  Hold pen, try to "twirl" like a baton, keeping parallel (or flat) with surface of table. Try going BOTH directions 10x  Flip:  Hold pen in writing position,  flip in an arch to "erase" position, then back to "write" position. Do not lift hand off table.  10x  Translation:  Open hand palm up,  put an object (bigger is easier (fat marker), smaller is harder (penny)) in your palm and then use your fingers and thumb to move it to the tips of your fingers, pinched against your thumb.  10x  Shift:  Hold pen like a dart, start "shifting" it forward (like putting a key in a key hole) until you are holding it at the base, then shift it backwards until you are holding it at the tip again. 10x     PATIENT EDUCATION: Education details: See tx section above for details  Person educated: Patient Education method: Verbal Instruction, Teach back, Handouts  Education comprehension: States and demonstrates understanding, Additional Education required    HOME EXERCISE PROGRAM: See tx section above for details    GOALS: Goals reviewed with patient? Yes   SHORT TERM GOALS: (STG required if POC>30 days) Target Date: 05/17/22   Pt will demo/state understanding of initial HEP to improve FMS, coordination, and general upper body strength. Goal status: INITIAL  2.  Pt will complete the remaining portions of evaluation of FMS, strength, as her conversation limited the initial eval.  Goal status: INITIAL   LONG TERM GOALS: Target Date: 06/07/22  Pt will improve coordination skills in b/l UEs, as seen by better score on 9-hole peg and box and blocks testing to have increased functional ability to carry out fine motor tasks (fasteners, etc.) and more complex, coordinated IADLs (meal  prep, sports, etc.).  Goal status: INITIAL - Needs baseline assessment next session   2. More goals to be added TBD, PRN  ASSESSMENT:  CLINICAL IMPRESSION: Patient is a 62 y.o. female who was seen today for occupational therapy evaluation for Lt CuTS, and poor coordination that effects handwriting and limits daily abilities. She will benefit from OP OT to increase quality of life.  At end of session she also brings up being generally  weakn in b/l UEs, and would   PERFORMANCE DEFICITS: in functional skills including IADLs, coordination, dexterity, proprioception, sensation, strength, Fine motor control, body mechanics, endurance, and UE functional use, cognitive skills including attention, problem solving, and safety awareness, and psychosocial skills including coping strategies, environmental adaptation, and habits.   IMPAIRMENTS: are limiting patient from IADLs and leisure.   COMORBIDITIES: has co-morbidities such as cancer and a host of other issues related to that  that affects occupational performance. Patient will benefit from skilled OT to address above impairments and improve overall function.  MODIFICATION OR ASSISTANCE TO COMPLETE EVALUATION: No modification of tasks or assist necessary to complete an evaluation.  OT OCCUPATIONAL PROFILE AND HISTORY: Problem focused assessment: Including review of records relating to presenting problem.  CLINICAL DECISION MAKING: LOW - limited treatment options, no task modification necessary  REHAB POTENTIAL: Good  EVALUATION COMPLEXITY: Low      PLAN:  OT FREQUENCY: 1x/week  OT DURATION: 4 weeks  PLANNED INTERVENTIONS: self care/ADL training, therapeutic exercise, therapeutic activity, neuromuscular re-education, manual therapy, scar mobilization, passive range of motion, splinting, compression bandaging, moist heat, cryotherapy, patient/family education, coping strategies training, and Dry needling  RECOMMENDED OTHER SERVICES: she is  seeing SLP for other issues right now  CONSULTED AND AGREED WITH PLAN OF CARE: Patient  PLAN FOR NEXT SESSION:  Measure 2 point discrimination in both hands, check grip strength and pinch strength and coordination of both hands in detail.  Try to keep her on track as she tends to get slightly off topic in conversation.    05/15/2022, 3:16 PM

## 2022-05-16 ENCOUNTER — Ambulatory Visit: Payer: BC Managed Care – PPO | Admitting: Speech Pathology

## 2022-05-16 ENCOUNTER — Ambulatory Visit: Payer: BC Managed Care – PPO | Admitting: Occupational Therapy

## 2022-05-20 NOTE — Therapy (Deleted)
OUTPATIENT OCCUPATIONAL THERAPY ORTHO EVALUATION  Patient Name: Carolyn Hooper MRN: [id number] DOB:09-19-1960, 62 y.o., female Today's Date: 05/20/2022  PCP: Evelina Dun, FNP  REFERRING PROVIDER: Ventura Sellers, MD   END OF SESSION:    Past Medical History:  Diagnosis Date   Abscess    hydroadenitis superativa   Anxiety    DM (diabetes mellitus) ([place])    Hyperinsulinemia    Hypertension 1983   gestational - pre-eclampsia   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[facility])    Neuroma    audiotry neuroma   Seizure ([place])    Vertigo    Past Surgical History:  Procedure Laterality Date   ABDOMINAL HYSTERECTOMY  2006   remaining left ovary   BRONCHIAL BIOPSY  02/19/2022   Procedure: BRONCHIAL BIOPSIES;  Surgeon: Garner Nash, DO;  Location: [place] [facility];  Service: Pulmonary;;   BRONCHIAL BRUSHINGS  02/19/2022   Procedure: BRONCHIAL BRUSHINGS;  Surgeon: Garner Nash, DO;  Location: [place] [facility];  Service: Pulmonary;;   BRONCHIAL NEEDLE ASPIRATION BIOPSY  02/19/2022   Procedure: BRONCHIAL NEEDLE ASPIRATION BIOPSIES;  Surgeon: Garner Nash, DO;  Location: [place];  Service: Pulmonary;;   IR IMAGING GUIDED PORT INSERTION  03/29/2022   SHOULDER SURGERY Right 1960s   Patient Active Problem List   Diagnosis Date Noted   Port-A-Cath in place 04/24/2022   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 ([place]) 03/07/2022   Focal seizure ([place]) 03/07/2022   Non-small cell lung cancer ([place]) 02/25/2022   Goals of care, counseling/discussion 02/25/2022   Lung mass 02/13/2022   Subarachnoid hemorrhage ([place]) 02/12/2022   Aphasia 02/12/2022   Brain mass 02/12/2022   Leukocytosis 02/12/2022   Mass of lower lobe of left lung 02/12/2022   Unilateral vestibular schwannoma ([place]) 01/08/2021   Refusal of statin medication by patient 07/10/2020   Myalgia due to statin 07/10/2020   Controlled substance agreement signed 05/28/2018   Benzodiazepine dependence ([place]) 05/28/2018    Hyperlipidemia associated with type 2 diabetes mellitus ([place]) 04/11/2016   Morbid obesity ([place]) 04/11/2016   Former smoker 04/11/2016   Hidradenitis suppurativa 04/11/2016   Generalized anxiety disorder 08/19/2013   Uncontrolled type 2 diabetes mellitus with hyperglycemia, without long-term current use of insulin ([place]) 08/19/2013   Hyperinsulinemia 08/19/2013    ONSET DATE: Seizure in Nov 2023  REFERRING DIAG: C34.90,C79.31 (ICD-10-CM) - 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  Rationale for Evaluation and Treatment: Rehabilitation  SUBJECTIVE:   SUBJECTIVE STATEMENT: She states going through chemo and radiation for lung and brain CA and fears related to this.  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 She also states weakness in b/l hands. 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  At the very end of the evaluation she also brings up having some bilateral upper extremity weakness that she would like to work on generally as well.   PERTINENT HISTORY: She has history of lung cancer that has metastasized to her brain.  She had a seizure last November, likely caused by cancer and/or chemo/radiation.  She states having poor coordination compared to her baseline, and she is also seeking speech therapy for speech/cognitive issues.  PRECAUTIONS: Other: active cancer and in treatment ; WEIGHT BEARING RESTRICTIONS: No  PAIN:  Are you having pain? Not now at rest  Rating: 0/10 at rest now, up to 1-2/10 at worst in past week   FALLS: Has patient fallen in last 6 months? No  LIVING ENVIRONMENT: Lives with: she has been staying with her mother and/or son at times (closer to providers) Has following equipment at home: None  PLOF:  Independent  PATIENT GOALS: To improve her handwriting in the right dominant hand and coordination in both hands, as well as get a general upper body strengthening program   OBJECTIVE: (All objective assessments below are from initial evaluation on: 05/01/22 unless otherwise specified.)   HAND DOMINANCE: Right   ADLs: Overall ADLs: States decreased ability to write, type, pick up small things like medications.   FUNCTIONAL OUTCOME MEASURES: Eval: Quck DASH 7.5% impairment today  (Higher % Score  =  More Impairment)     UPPER EXTREMITY ROM    Eval: Due to length of evaluation, these things were not addressed in detail, however she appeared to have within functional range of motion in the entire upper extremity, including the hands and thumbs.  OT would recommend checking wrist motion in greater detail as this is often linked to tightness and difficulties in the hands.  Shoulder to Wrist AROM Right TBD Left TBD  Wrist flexion    Wrist extension    (Blank rows = not tested)   Hand AROM Right TBD Left TBD  Full Fist Ability (or Gap to Distal Palmar Crease)    Thumb Opposition  (Kapandji Scale)     (Blank rows = not tested)   UPPER EXTREMITY MMT:    Eval:  NT at eval due length of time to complete evaluation, this should be addressed in the next session as time allows due to complaints of upper body weakness  MMT Right TBD Left TBD  Shoulder flexion    Shoulder abduction    Shoulder adduction    Shoulder extension    Shoulder internal rotation    Shoulder external rotation    Middle trapezius    Lower trapezius    Elbow flexion    Elbow extension    Forearm supination    Forearm pronation    Wrist flexion    Wrist extension    Wrist ulnar deviation    Wrist radial deviation    (Blank rows = not tested)  HAND FUNCTION: Eval: Observed weakness in affected hand.  Grip strength Right: 49 lbs, Left: 35 lbs   COORDINATION: Eval: Observed coordination impairments  with affected b/l hands.  Details unable to be determined at eval due to the length of time it took to complete the eval  Box and Blocks Test: TBD Blocks today (TBD is [facility]); 9 Hole Peg Test Right: TBDsec, Left: TBD sec  SENSATION: Eval:  Light touch intact today, though diminished in Lt hand SF and RF somewhat chronically, details TBD  EDEMA:   Eval: none significant   COGNITION: Eval: Overall cognitive status: WFL for evaluation today, and she was very pleasant and well-spoken, however she was very verbose and had many details and explanations about her past and current conditions which took up a significant portion of the evaluation time today- limiting the things that could be accomplished.   OBSERVATIONS:   Eval: She had dry flaking and red skin over her left elbow compared to the right elbow.  This does not seem insignificant as she has left elbow complaints of ulnar neuritis.  Additionally she had tremors  when trying to hand right and type in front of the OT today, the detailed measure could not be taken due to her detailed explanations today.  Motion appears intact and strength seems at least fair, she states being "the one that everyone asks to open jars at home."  Her endurance may be decreased however.   TODAY'S TREATMENT:  Post-evaluation treatment: OT starts by explaining the tremors and poor coordination can be a side effect of cancer and chemotherapy.  OT cannot guarantee that the skills will improve, but by practicing coordination and various techniques they may be able to improve or prevent a decline in skills.  For CuTS issues, OT advises her to watch her habits, avoid direct pressure to Lt elbow, moisturize it, keep it at a 90* or straighter angle in the night and possibly wear an elbow support for that purpose at night.  She states understanding.  OT also recommends to continue with all FMS and FNL activities that are safe for her, even if they are a struggle, as this is what  will help prevent dysfunction.  OT also prints out the following in-hand manipulations for her to practice every day 2-3 x day to increase FMS and proprioception through b/l hands.    In-Hand Manipulation Skills  Rotation:  Hold pen, try to "twirl" like a baton, keeping parallel (or flat) with surface of table. Try going BOTH directions 10x  Flip:  Hold pen in writing position,  flip in an arch to "erase" position, then back to "write" position. Do not lift hand off table.  10x  Translation:  Open hand palm up,  put an object (bigger is easier (fat marker), smaller is harder (penny)) in your palm and then use your fingers and thumb to move it to the tips of your fingers, pinched against your thumb.  10x  Shift:  Hold pen like a dart, start "shifting" it forward (like putting a key in a key hole) until you are holding it at the base, then shift it backwards until you are holding it at the tip again. 10x     PATIENT EDUCATION: Education details: See tx section above for details  Person educated: Patient Education method: Verbal Instruction, Teach back, Handouts  Education comprehension: States and demonstrates understanding, Additional Education required    HOME EXERCISE PROGRAM: See tx section above for details    GOALS: Goals reviewed with patient? Yes   SHORT TERM GOALS: (STG required if POC>30 days) Target Date: 05/17/22   Pt will demo/state understanding of initial HEP to improve FMS, coordination, and general upper body strength. Goal status: INITIAL  2.  Pt will complete the remaining portions of evaluation of FMS, strength, as her conversation limited the initial eval.  Goal status: INITIAL   LONG TERM GOALS: Target Date: 06/07/22  Pt will improve coordination skills in b/l UEs, as seen by better score on 9-hole peg and box and blocks testing to have increased functional ability to carry out fine motor tasks (fasteners, etc.) and more complex, coordinated IADLs (meal  prep, sports, etc.).  Goal status: INITIAL - Needs baseline assessment next session   2. More goals to be added TBD, PRN  ASSESSMENT:  CLINICAL IMPRESSION: Patient is a 62 y.o. female who was seen today for occupational therapy evaluation for Lt CuTS, and poor coordination that effects handwriting and limits daily abilities. She will benefit from OP OT to increase quality of life.  At end of session she also brings up being generally  weakn in b/l UEs, and would   PERFORMANCE DEFICITS: in functional skills including IADLs, coordination, dexterity, proprioception, sensation, strength, Fine motor control, body mechanics, endurance, and UE functional use, cognitive skills including attention, problem solving, and safety awareness, and psychosocial skills including coping strategies, environmental adaptation, and habits.   IMPAIRMENTS: are limiting patient from IADLs and leisure.   COMORBIDITIES: has co-morbidities such as cancer and a host of other issues related to that  that affects occupational performance. Patient will benefit from skilled OT to address above impairments and improve overall function.  MODIFICATION OR ASSISTANCE TO COMPLETE EVALUATION: No modification of tasks or assist necessary to complete an evaluation.  OT OCCUPATIONAL PROFILE AND HISTORY: Problem focused assessment: Including review of records relating to presenting problem.  CLINICAL DECISION MAKING: LOW - limited treatment options, no task modification necessary  REHAB POTENTIAL: Good  EVALUATION COMPLEXITY: Low      PLAN:  OT FREQUENCY: 1x/week  OT DURATION: 4 weeks  PLANNED INTERVENTIONS: self care/ADL training, therapeutic exercise, therapeutic activity, neuromuscular re-education, manual therapy, scar mobilization, passive range of motion, splinting, compression bandaging, moist heat, cryotherapy, patient/family education, coping strategies training, and Dry needling  RECOMMENDED OTHER SERVICES: she is  seeing SLP for other issues right now  CONSULTED AND AGREED WITH PLAN OF CARE: Patient  PLAN FOR NEXT SESSION:  Measure 2 point discrimination in both hands, check grip strength and pinch strength and coordination of both hands in detail.  Try to keep her on track as she tends to get slightly off topic in conversation.   Theone Murdoch, OTR/L 05/20/2022, 3:24 PM

## 2022-05-21 ENCOUNTER — Telehealth: Payer: Self-pay | Admitting: Medical Oncology

## 2022-05-21 ENCOUNTER — Inpatient Hospital Stay: Payer: BC Managed Care – PPO

## 2022-05-21 NOTE — Telephone Encounter (Signed)
Forgot to report she has a productive cough " snotty green" , slight pink tinged in color . No SOB, no chills /fever.  I instructed Normal to monitor for now and call tomorrow with update. If she experiences increased sputum , new SOB  call or go to ED.

## 2022-05-21 NOTE — Telephone Encounter (Signed)
-  2 Covid symptoms reported - runny nose, cough. She has Port flush with labs appt today. She said her sister has Covid and her husband who visited with [place] 4 days ago now reports covid symptoms but has not tested. Ekaterina  may have been exposed to COVID from her bother in law .  I instructed pt to take a COVID home test and call results

## 2022-05-21 NOTE — Telephone Encounter (Signed)
Pt called to report she tested COVID + on a home test.  I instructed pt to quarantine for 5 days and she can come back on on 05/28/22 for her next port flush..  I instructed pt to call for worsening cough, temp > 100.74f chills, sore throat,N/V/D. She voiced understanding.

## 2022-05-22 ENCOUNTER — Telehealth: Payer: Self-pay | Admitting: Medical Oncology

## 2022-05-22 ENCOUNTER — Other Ambulatory Visit: Payer: Self-pay

## 2022-05-22 NOTE — Telephone Encounter (Signed)
Per Dr. Julien Nordmann I instructed "Continue symptom management".

## 2022-05-22 NOTE — Telephone Encounter (Signed)
COVID symptoms update- "Nose running all the time now and cough increasing w increased sputum production that is olive green to brick red brown color". Denies fever  , Dyspnea.

## 2022-05-23 ENCOUNTER — Ambulatory Visit: Payer: BC Managed Care – PPO | Admitting: Occupational Therapy

## 2022-05-23 ENCOUNTER — Ambulatory Visit: Payer: BC Managed Care – PPO | Admitting: Speech Pathology

## 2022-05-23 ENCOUNTER — Telehealth: Payer: Self-pay | Admitting: Medical Oncology

## 2022-05-23 NOTE — Telephone Encounter (Signed)
"  Under the weather". Carolyn Hooper woke up with a chill . Temp today was 100.79f She took 2 tylenol at 1100.  Mucinex has "quieted " her cough and zyrtec has helped her runny nose. Any advice

## 2022-05-23 NOTE — Telephone Encounter (Signed)
Temp=98.8. Per Dr. Julien Nordmann , I instructed pt to continue to treat symptoms and keep appt next Tuesday for labs.

## 2022-05-26 ENCOUNTER — Other Ambulatory Visit: Payer: Self-pay

## 2022-05-26 ENCOUNTER — Emergency Department (HOSPITAL_COMMUNITY): Payer: BC Managed Care – PPO

## 2022-05-26 ENCOUNTER — Emergency Department (HOSPITAL_COMMUNITY)
Admission: EM | Admit: 2022-05-26 | Discharge: 2022-05-26 | Disposition: A | Discharge status: Home / Self Care | Payer: BC Managed Care – PPO | Attending: Emergency Medicine | Admitting: Emergency Medicine

## 2022-05-26 ENCOUNTER — Encounter (HOSPITAL_COMMUNITY): Payer: Self-pay

## 2022-05-26 ENCOUNTER — Other Ambulatory Visit (HOSPITAL_COMMUNITY): Payer: Self-pay

## 2022-05-26 DIAGNOSIS — E119 Type 2 diabetes mellitus without complications: Secondary | ICD-10-CM | POA: Insufficient documentation

## 2022-05-26 DIAGNOSIS — Z8616 Personal history of COVID-19: Secondary | ICD-10-CM | POA: Insufficient documentation

## 2022-05-26 DIAGNOSIS — R42 Dizziness and giddiness: Secondary | ICD-10-CM | POA: Diagnosis not present

## 2022-05-26 DIAGNOSIS — Z85118 Personal history of other malignant neoplasm of bronchus and lung: Secondary | ICD-10-CM | POA: Insufficient documentation

## 2022-05-26 DIAGNOSIS — N9089 Other specified noninflammatory disorders of vulva and perineum: Secondary | ICD-10-CM | POA: Insufficient documentation

## 2022-05-26 DIAGNOSIS — Z79899 Other long term (current) drug therapy: Secondary | ICD-10-CM | POA: Insufficient documentation

## 2022-05-26 DIAGNOSIS — D649 Anemia, unspecified: Secondary | ICD-10-CM | POA: Diagnosis not present

## 2022-05-26 DIAGNOSIS — I10 Essential (primary) hypertension: Secondary | ICD-10-CM | POA: Insufficient documentation

## 2022-05-26 DIAGNOSIS — Z7984 Long term (current) use of oral hypoglycemic drugs: Secondary | ICD-10-CM | POA: Diagnosis not present

## 2022-05-26 DIAGNOSIS — C3492 Malignant neoplasm of unspecified part of left bronchus or lung: Secondary | ICD-10-CM

## 2022-05-26 DIAGNOSIS — N390 Urinary tract infection, site not specified: Secondary | ICD-10-CM | POA: Insufficient documentation

## 2022-05-26 DIAGNOSIS — R0902 Hypoxemia: Secondary | ICD-10-CM | POA: Diagnosis not present

## 2022-05-26 DIAGNOSIS — R918 Other nonspecific abnormal finding of lung field: Secondary | ICD-10-CM | POA: Diagnosis not present

## 2022-05-26 DIAGNOSIS — R319 Hematuria, unspecified: Secondary | ICD-10-CM | POA: Diagnosis not present

## 2022-05-26 DIAGNOSIS — R059 Cough, unspecified: Secondary | ICD-10-CM | POA: Diagnosis not present

## 2022-05-26 DIAGNOSIS — G40802 Other epilepsy, not intractable, without status epilepticus: Secondary | ICD-10-CM | POA: Diagnosis not present

## 2022-05-26 DIAGNOSIS — R0602 Shortness of breath: Secondary | ICD-10-CM | POA: Diagnosis not present

## 2022-05-26 DIAGNOSIS — R569 Unspecified convulsions: Secondary | ICD-10-CM | POA: Diagnosis not present

## 2022-05-26 DIAGNOSIS — C3490 Malignant neoplasm of unspecified part of unspecified bronchus or lung: Secondary | ICD-10-CM | POA: Diagnosis not present

## 2022-05-26 DIAGNOSIS — R911 Solitary pulmonary nodule: Secondary | ICD-10-CM | POA: Diagnosis not present

## 2022-05-26 DIAGNOSIS — Z743 Need for continuous supervision: Secondary | ICD-10-CM | POA: Diagnosis not present

## 2022-05-26 LAB — CBC WITH DIFFERENTIAL/PLATELET
Abs Immature Granulocytes: 0.03 10*3/uL (ref 0.00–0.07)
Basophils Absolute: 0 10*3/uL (ref 0.0–0.1)
Basophils Relative: 0 %
Eosinophils Absolute: 0 10*3/uL (ref 0.0–0.5)
Eosinophils Relative: 1 %
HCT: 24.8 % — ABNORMAL LOW (ref 36.0–46.0)
Hemoglobin: 8.4 g/dL — ABNORMAL LOW (ref 12.0–15.0)
Immature Granulocytes: 1 %
Lymphocytes Relative: 26 %
Lymphs Abs: 1 10*3/uL (ref 0.7–4.0)
MCH: 33.7 pg (ref 26.0–34.0)
MCHC: 33.9 g/dL (ref 30.0–36.0)
MCV: 99.6 fL (ref 80.0–100.0)
Monocytes Absolute: 0.4 10*3/uL (ref 0.1–1.0)
Monocytes Relative: 11 %
Neutro Abs: 2.5 10*3/uL (ref 1.7–7.7)
Neutrophils Relative %: 61 %
Platelets: 39 10*3/uL — ABNORMAL LOW (ref 150–400)
RBC: 2.49 MIL/uL — ABNORMAL LOW (ref 3.87–5.11)
RDW: 18.1 % — ABNORMAL HIGH (ref 11.5–15.5)
WBC: 3.9 10*3/uL — ABNORMAL LOW (ref 4.0–10.5)
nRBC: 1.8 % — ABNORMAL HIGH (ref 0.0–0.2)

## 2022-05-26 LAB — URINALYSIS, W/ REFLEX TO CULTURE (INFECTION SUSPECTED)
Bilirubin Urine: NEGATIVE
Glucose, UA: 50 mg/dL — AB
Hgb urine dipstick: NEGATIVE
Ketones, ur: 5 mg/dL — AB
Nitrite: NEGATIVE
Protein, ur: 30 mg/dL — AB
Specific Gravity, Urine: 1.023 (ref 1.005–1.030)
pH: 5 (ref 5.0–8.0)

## 2022-05-26 LAB — COMPREHENSIVE METABOLIC PANEL
ALT: 48 U/L — ABNORMAL HIGH (ref 0–44)
AST: 26 U/L (ref 15–41)
Albumin: 3.3 g/dL — ABNORMAL LOW (ref 3.5–5.0)
Alkaline Phosphatase: 53 U/L (ref 38–126)
Anion gap: 13 (ref 5–15)
BUN: 24 mg/dL — ABNORMAL HIGH (ref 8–23)
CO2: 22 mmol/L (ref 22–32)
Calcium: 8.5 mg/dL — ABNORMAL LOW (ref 8.9–10.3)
Chloride: 97 mmol/L — ABNORMAL LOW (ref 98–111)
Creatinine, Ser: 0.74 mg/dL (ref 0.44–1.00)
GFR, Estimated: >60 mL/min (ref >60)
Glucose, Bld: 282 mg/dL — ABNORMAL HIGH (ref 70–99)
Potassium: 3.4 mmol/L — ABNORMAL LOW (ref 3.5–5.1)
Sodium: 132 mmol/L — ABNORMAL LOW (ref 135–145)
Total Bilirubin: 0.9 mg/dL (ref 0.3–1.2)
Total Protein: 6.6 g/dL (ref 6.5–8.1)

## 2022-05-26 MED ORDER — LIDOCAINE HCL URETHRAL/MUCOSAL 2 % EX GEL
1.0000 | Freq: Once | CUTANEOUS | Status: AC
  Administered 2022-05-26: 1 via TOPICAL
  Filled 2022-05-26: qty 11

## 2022-05-26 MED ORDER — LEVETIRACETAM 1000 MG PO TABS
1000.0000 mg | ORAL_TABLET | Freq: Two times a day (BID) | ORAL | 1 refills | Status: DC
  Filled 2022-05-26: qty 60, 30d supply, fill #0
  Filled 2022-06-24: qty 60, 30d supply, fill #1

## 2022-05-26 MED ORDER — FOLIC ACID 1 MG PO TABS
1.0000 mg | ORAL_TABLET | Freq: Every day | ORAL | 1 refills | Status: DC
  Filled 2022-05-26: qty 30, 30d supply, fill #0
  Filled 2022-06-25: qty 30, 30d supply, fill #1

## 2022-05-26 MED ORDER — HEPARIN SOD (PORK) LOCK FLUSH 100 UNIT/ML IV SOLN
500.0000 [IU] | Freq: Once | INTRAVENOUS | Status: AC
  Administered 2022-05-26: 500 [IU]

## 2022-05-26 MED ORDER — ONDANSETRON 8 MG PO TBDP
8.0000 mg | ORAL_TABLET | Freq: Once | ORAL | Status: AC
  Administered 2022-05-26: 8 mg via ORAL
  Filled 2022-05-26: qty 1

## 2022-05-26 MED ORDER — HEPARIN SOD (PORK) LOCK FLUSH 100 UNIT/ML IV SOLN
INTRAVENOUS | Status: AC
  Filled 2022-05-26: qty 5

## 2022-05-26 MED ORDER — CEPHALEXIN 500 MG PO CAPS
500.0000 mg | ORAL_CAPSULE | Freq: Four times a day (QID) | ORAL | 0 refills | Status: AC
  Filled 2022-05-26: qty 28, 7d supply, fill #0

## 2022-05-26 MED ORDER — SODIUM CHLORIDE 0.9 % IV SOLN
1.0000 g | Freq: Once | INTRAVENOUS | Status: AC
  Administered 2022-05-26: 1 g via INTRAVENOUS
  Filled 2022-05-26: qty 10

## 2022-05-26 NOTE — Discharge Instructions (Signed)
Follow-up with your care team, call tomorrow.  Return to ER for worsening or concerning symptoms. Take Keflex as prescribed and complete the full course.

## 2022-05-26 NOTE — ED Notes (Signed)
Patient given discharge instructions and follow up care. Patient verbalized understanding. Patient taken out of ED via stretcher with PTAR.

## 2022-05-26 NOTE — ED Notes (Signed)
Portable x-ray is at the bedside

## 2022-05-26 NOTE — ED Triage Notes (Signed)
Pt arrived via EMS, from home, c/o dysuria and blood in urine. States tested positive for COVID x2 days ago.   Hx of Lung CA, mets to brain. States active chemo  EMS states spo2 88% on arrival while hyperventilating, was placed on 2L, once calmed spo2 90 or above.

## 2022-05-26 NOTE — ED Provider Notes (Signed)
Carolyn Hooper   CSN: [id number] Arrival date & time: 05/26/22  1138     History  Chief Complaint  Patient presents with   Covid Positive   Hematuria    Carolyn Hooper is a 62 y.o. female.  62 year old female with past medical history of lung cancer with mets to brain (last chemo 05/15/2022), diabetes, hypertension, seizures, anxiety brought in by EMS from home with complaint of dysuria and hematuria.  Patient states that she tested positive for COVID 5 days ago and is doing well in regards to [place] however has had dysuria and hematuria x 3 days.  Reports last fever yesterday, is taking Tylenol.  Denies abdominal pain, vomiting.  No history of kidney stones and states she just had a CT scan within the past few weeks that did not suggest stones.  Reports baseline shortness of breath, dizziness with transitions, nausea, body aches.       Home Medications Prior to Admission medications   Medication Sig Start Date End Date Taking? Authorizing Provider  cephALEXin (KEFLEX) 500 MG capsule Take 1 capsule (500 mg total) by mouth 4 (four) times daily for 7 days. 05/26/22 06/02/22 Yes Tacy Learn, PA-C  albuterol (VENTOLIN HFA) 108 (90 Base) MCG/ACT inhaler Inhale 2 puffs into the lungs every 6 (six) hours as needed. Patient not taking: Reported on 05/06/2022 01/08/21   [name], FNP  ALPRAZolam Duanne Moron) 0.5 MG tablet Take 1 tablet (0.5 mg total) by mouth 2 (two) times daily as needed for anxiety. 05/03/22   [name], FNP  clonazePAM (KLONOPIN) 0.5 MG tablet Take 0.5 tablets (0.25 mg total) by mouth at bedtime. For anxiety Patient taking differently: Take 0.25 mg by mouth daily as needed for anxiety (and vertigo). 11/01/21   [name], FNP  dexamethasone (DECADRON) 1 MG tablet Take 3 tablets (3 mg total) by mouth daily with breakfast. 03/28/22   Vaslow, Acey Lav, MD  folic acid (FOLVITE) 1 MG tablet Take 1 tablet (1  mg total) by mouth daily. 05/26/22   Tacy Learn, PA-C  levETIRAcetam (KEPPRA) 1000 MG tablet Take 1 tablet (1,000 mg total) by mouth 2 (two) times daily. 05/26/22   Tacy Learn, PA-C  lidocaine-prilocaine (EMLA) cream Apply to port prior to access 04/25/22   Curt Bears, MD  magic mouthwash (nystatin, diphenhydrAMINE, alum & mag hydroxide) suspension mixture Take 5 mLs by mouth 3 (three) times daily as needed for mouth pain. 04/15/22   Heilingoetter, [name], PA-C  metFORMIN (GLUCOPHAGE) 1000 MG tablet Take 1 tablet (1,000 mg total) by mouth 2 (two) times daily. 05/10/22   [name], FNP  metFORMIN (GLUCOPHAGE) 850 MG tablet Take 425-850 mg by mouth See admin instructions. 425 mg - 850 mg with meals, depending on how much carbs and sugars Pt eats    [provider]  nystatin (MYCOSTATIN) 100000 UNIT/ML suspension TAKE 5 ML BY MOUTH  4 TIMES DAILY AS NEEDED 04/15/22   Heilingoetter, [name], PA-C  ondansetron (ZOFRAN) 4 MG tablet Take 1 tablet (4 mg total) by mouth every 6 (six) hours as needed for nausea. 02/13/22   Danford, Suann Larry, MD  prochlorperazine (COMPAZINE) 10 MG tablet Take 1 tablet (10 mg total) by mouth every 6 (six) hours as needed. 02/25/22   Heilingoetter, [name], PA-C      Allergies    Adhesive [tape] and Statins    Review of Systems   Review  of Systems Negative except as per HPI Physical Exam Updated Vital Signs BP 128/85   Pulse 80   Temp 98.9 F (37.2 C)   Resp 14   SpO2 98%  Physical Exam Vitals and nursing Hooper reviewed. Exam conducted with a chaperone present.  Constitutional:      General: She is not in acute distress.    Appearance: She is well-developed. She is not diaphoretic.  HENT:     Head: Normocephalic and atraumatic.  Cardiovascular:     Rate and Rhythm: Normal rate and regular rhythm.     Heart sounds: Normal heart sounds.  Pulmonary:     Effort: Pulmonary effort is normal.     Breath sounds: Normal breath  sounds.  Abdominal:     Palpations: Abdomen is soft.     Tenderness: There is no abdominal tenderness. There is no right CVA tenderness or left CVA tenderness.  Genitourinary:    Comments: Ulcerated lesions to the labia which are very tender to the touch Musculoskeletal:     Right lower leg: No edema.     Left lower leg: No edema.  Skin:    General: Skin is warm and dry.     Findings: No erythema or rash.  Neurological:     Mental Status: She is alert and oriented to person, place, and time.  Psychiatric:        Behavior: Behavior normal.     ED Results / Procedures / Treatments   Labs (all labs ordered are listed, but only abnormal results are displayed) Labs Reviewed  CBC WITH DIFFERENTIAL/PLATELET - Abnormal; Notable for the following components:      Result Value   WBC 3.9 (*)    RBC 2.49 (*)    Hemoglobin 8.4 (*)    HCT 24.8 (*)    RDW 18.1 (*)    Platelets 39 (*)    nRBC 1.8 (*)    All other components within normal limits  COMPREHENSIVE METABOLIC PANEL - Abnormal; Notable for the following components:   Sodium 132 (*)    Potassium 3.4 (*)    Chloride 97 (*)    Glucose, Bld 282 (*)    BUN 24 (*)    Calcium 8.5 (*)    Albumin 3.3 (*)    ALT 48 (*)    All other components within normal limits  URINALYSIS, W/ REFLEX TO CULTURE (INFECTION SUSPECTED) - Abnormal; Notable for the following components:   Color, Urine AMBER (*)    APPearance CLOUDY (*)    Glucose, UA 50 (*)    Ketones, ur 5 (*)    Protein, ur 30 (*)    Leukocytes,Ua TRACE (*)    Bacteria, UA MANY (*)    All other components within normal limits  URINE CULTURE    EKG EKG Interpretation  Date/Time:  Sunday May 26 2022 11:51:45 EDT Ventricular Rate:  87 PR Interval:  149 QRS Duration: 100 QT Interval:  368 QTC Calculation: 443 R Axis:   -4 Text Interpretation: Sinus rhythm Low voltage, precordial leads No significant change since prior 11/23 Confirmed by Aletta Edouard [phone]) on  05/26/2022 12:56:48 PM  Radiology DG Chest Port 1 View  Result Date: 05/26/2022 CLINICAL DATA:  Shortness of breath.  Lung cancer EXAM: PORTABLE CHEST 1 VIEW COMPARISON:  02/19/2022, 05/13/2022 FINDINGS: Right-sided chest port terminates at the level of the distal SVC. Heart size within normal limits. Aortic atherosclerosis. Medial left lower lobe lung mass and small bilateral pulmonary nodules, better  seen by recent CT. No new focal airspace consolidation. No pleural effusion or pneumothorax. IMPRESSION: 1. No active disease. 2. Medial left lower lobe lung mass and small bilateral pulmonary nodules, better seen by recent CT. Electronically Signed   By: Davina Poke D.O.   On: 05/26/2022 12:57    Procedures Procedures    Medications Ordered in ED Medications  cefTRIAXone (ROCEPHIN) 1 g in sodium chloride 0.9 % 100 mL IVPB (has no administration in time range)  ondansetron (ZOFRAN-ODT) disintegrating tablet 8 mg (8 mg Oral Given 05/26/22 1249)  lidocaine (XYLOCAINE) 2 % jelly 1 Application (1 Application Topical Given 05/26/22 1447)    ED Course/ Medical Decision Making/ A&P                             Medical Decision Making Amount and/or Complexity of Data Reviewed Labs: ordered. Radiology: ordered.  Risk Prescription drug management.   This patient presents to the ED for concern of dysuria, this involves an extensive number of treatment options, and is a complaint that carries with it a high risk of complications and morbidity.  The differential diagnosis includes cystitis, stone   Co morbidities that complicate the patient evaluation  Metastatic lung CA, cholelithiasis, DM, seizure, HTN Prior surgical history to include abdominal hysterectomy   Additional history obtained:  Additional history obtained from EMS who contributes to history as above, also reports O2 88% on arrival, room air sats >90% while awake in the ER, states she believes she has undiagnosed sleep  apnea External records from outside source obtained and reviewed including prior labs on file for comparison as well as most recent visit to oncology dates 05/15/22, most recent CT report    Lab Tests:  I Ordered, and personally interpreted labs.  The pertinent results include:  CBC with WBC 3.9, neutrophils WNL; platelets low at 39, hgb 8.4 (previously 11.4). UA (cath) ketones, protien, trace leukocytes with many bacteria (sent for cx).  CMP with elevated glucose at 282, not in DKA   Imaging Studies ordered:  I ordered imaging studies including CXR  I independently visualized and interpreted imaging which showed no acute disease  I agree with the radiologist interpretation   Cardiac Monitoring: / EKG:  The patient was maintained on a cardiac monitor.  I personally viewed and interpreted the cardiac monitored which showed an underlying rhythm of: sinus rhythm, rate 87   Consultations Obtained:  I requested consultation with the ER attending, Dr. Melina Copa,  and discussed lab and imaging findings as well as pertinent plan - they recommend: agrees with plan of care.    Problem List / ED Course / Critical interventions / Medication management  62 year old female with history as above presents with dysuria and hematuria x 3 days. Found to have a UTI, treated with rocephin and rx for keflex, sent for cx. Anemia- denies blood in stool, vitals stable, considered hemoccult but due to recent chemo, low WBC/platelets, deferred. Also with ulcerated lesions to labia, consider related to chemo, recommend discuss further with her care team at her appointment this week.  I ordered medication including rocephin  for UTI  Reevaluation of the patient after these medicines showed that the patient stayed the same I have reviewed the patients home medicines and have made adjustments as needed   Social Determinants of Health:  Lives with her mother   Test / Admission - Considered:  Offered admission  for further work  up and management, prefers dc home and will follow up with her care team. Agrees to return if not able to manage at home         Final Clinical Impression(s) / ED Diagnoses Final diagnoses:  Urinary tract infection in female  Lesion of labia  Anemia, unspecified type    Rx / DC Orders ED Discharge Orders          Ordered    levETIRAcetam (KEPPRA) 1000 MG tablet  2 times daily        [id number]    folic acid (FOLVITE) 1 MG tablet  Daily        05/26/22 1628    cephALEXin (KEFLEX) 500 MG capsule  4 times daily        05/26/22 1628              Tacy Learn, PA-C 05/26/22 1636    Hayden Rasmussen, MD 05/27/22 1003

## 2022-05-27 ENCOUNTER — Other Ambulatory Visit (HOSPITAL_COMMUNITY): Payer: Self-pay

## 2022-05-27 ENCOUNTER — Ambulatory Visit: Payer: BC Managed Care – PPO | Admitting: Speech Pathology

## 2022-05-27 ENCOUNTER — Ambulatory Visit: Payer: BC Managed Care – PPO | Admitting: Occupational Therapy

## 2022-05-27 MED ORDER — ONDANSETRON HCL 8 MG PO TABS
8.0000 mg | ORAL_TABLET | Freq: Three times a day (TID) | ORAL | 1 refills | Status: DC
  Filled 2022-05-27: qty 20, 7d supply, fill #0

## 2022-05-28 ENCOUNTER — Other Ambulatory Visit: Payer: Self-pay | Admitting: *Deleted

## 2022-05-28 ENCOUNTER — Inpatient Hospital Stay: Payer: BC Managed Care – PPO

## 2022-05-28 ENCOUNTER — Telehealth: Payer: Self-pay | Admitting: Medical Oncology

## 2022-05-28 ENCOUNTER — Other Ambulatory Visit (HOSPITAL_COMMUNITY): Payer: Self-pay

## 2022-05-28 LAB — URINE CULTURE: Culture: >=100000 — AB

## 2022-05-28 MED ORDER — DEXAMETHASONE 1 MG PO TABS
3.0000 mg | ORAL_TABLET | Freq: Every day | ORAL | 1 refills | Status: DC
  Filled 2022-05-28: qty 90, 30d supply, fill #0

## 2022-05-28 NOTE — Telephone Encounter (Addendum)
Bleeding precautions given to pt.  Per Infection prevention , Merry Proud., I instructed pt that she needs to wear a mask to her MRI appts and her other appts next week.

## 2022-05-28 NOTE — Telephone Encounter (Addendum)
Painful labial lesions .  She is using EMLA cream on them . She has an appt with PCP on Thursday fo

## 2022-05-29 ENCOUNTER — Ambulatory Visit: Payer: BC Managed Care – PPO | Admitting: Occupational Therapy

## 2022-05-29 ENCOUNTER — Telehealth: Payer: Self-pay | Admitting: *Deleted

## 2022-05-29 ENCOUNTER — Ambulatory Visit: Payer: BC Managed Care – PPO | Admitting: Speech Pathology

## 2022-05-29 ENCOUNTER — Other Ambulatory Visit (HOSPITAL_COMMUNITY): Payer: Self-pay

## 2022-05-29 ENCOUNTER — Telehealth (HOSPITAL_BASED_OUTPATIENT_CLINIC_OR_DEPARTMENT_OTHER): Payer: Self-pay | Admitting: Emergency Medicine

## 2022-05-29 ENCOUNTER — Encounter: Payer: Self-pay | Admitting: *Deleted

## 2022-05-29 ENCOUNTER — Telehealth: Payer: Self-pay

## 2022-05-29 NOTE — Transitions of Care (Post Inpatient/ED Visit) (Signed)
   05/29/2022  Name: Carolyn Hooper MRN: [id number] DOB: [birth year]  Today's TOC FU Call Status: Today's TOC FU Call Status:: Successful TOC FU Call Competed TOC FU Call Complete Date: 05/29/22  Transition Care Management Follow-up Telephone Call Date of Discharge: 05/27/22 Discharge Facility: [facility]) Type of Discharge: Emergency Department (ED EMMI Flag) Reason for ED Visit: Other: (UTI) How have you been since you were released from the hospital?: Better Any questions or concerns?: Yes Patient Questions/Concerns:: whether pain during urination is r/t UTI or labial lesions r/t cancer and chemo. Patient Questions/Concerns Addressed: Other: (Waiting on a call back from oncology dept. They were returning her call at the end of my call.)  Items Reviewed: Did you receive and understand the discharge instructions provided?: Yes Medications obtained and verified?: Yes (Medications Reviewed) (advisd the urine culture and sensitivity results indicated that she is on the correct antibiotic) Any new allergies since your discharge?: No Dietary orders reviewed?: NA Do you have support at home?: Yes People in Home: parent(s) Name of Support/Comfort Primary Source: Mother-[name] and son-[name]  Home Care and Equipment/Supplies: [place] Ordered?: No Any new equipment or medical supplies ordered?: No  Functional Questionnaire: Do you need assistance with bathing/showering or dressing?: No Do you need assistance with meal preparation?: No Do you need assistance with eating?: No Do you have difficulty maintaining continence: No Do you need assistance with getting out of bed/getting out of a chair/moving?: No Do you have difficulty managing or taking your medications?: No  Folllow up appointments reviewed: PCP Follow-up appointment confirmed?: Yes Date of PCP follow-up appointment?: 05/30/22 Follow-up Provider: [facility] Follow-up appointment  confirmed?: NA Do you need transportation to your follow-up appointment?: No Do you understand care options if your condition(s) worsen?: Yes-patient verbalized understanding  SDOH Interventions Today    Flowsheet Row Most Recent Value  SDOH Interventions   Housing Interventions Intervention Not Indicated  Transportation Interventions Intervention Not Indicated      TOC Interventions Today    Flowsheet Row Most Recent Value  TOC Interventions   TOC Interventions Discussed/Reviewed TOC Interventions Discussed, TOC Interventions Reviewed      Interventions Today    Flowsheet Row Most Recent Value  Chronic Disease   Chronic disease during today's visit --  [UTI]  Pharmacy Interventions   Pharmacy Dicussed/Reviewed Medications and their functions      Chong Sicilian, BSN, RN-BC RN Care Coordinator [place]: [phone] Main #: [phone]

## 2022-05-29 NOTE — Telephone Encounter (Signed)
Post ED Visit - Positive Culture Follow-up  Culture report reviewed by antimicrobial stewardship pharmacist: [place] Team '[]'$  Elenor Quinones, Pharm.D. '[]'$  Heide Guile, Pharm.D., BCPS AQ-ID '[]'$  Parks Neptune, Pharm.D., BCPS '[x]'$  Alycia Rossetti, Pharm.D., BCPS '[]'$  [place], Pharm.D., BCPS, AAHIVP '[]'$  Legrand Como, Pharm.D., BCPS, AAHIVP '[]'$  Salome Arnt, PharmD, BCPS '[]'$  Johnnette Gourd, PharmD, BCPS '[]'$  Hughes Better, PharmD, BCPS '[]'$  Leeroy Cha, PharmD '[]'$  Laqueta Linden, PharmD, BCPS '[]'$  Albertina Parr, PharmD  [place] Team '[]'$  Leodis Sias, PharmD '[]'$  Lindell Spar, PharmD '[]'$  Royetta Asal, PharmD '[]'$  Graylin Shiver, Rph '[]'$  Rema Fendt) Glennon Mac, PharmD '[]'$  Arlyn Dunning, PharmD '[]'$  Netta Cedars, PharmD '[]'$  Dia Sitter, PharmD '[]'$  Leone Haven, PharmD '[]'$  Gretta Arab, PharmD '[]'$  Theodis Shove, PharmD '[]'$  Peggyann Juba, PharmD '[]'$  Reuel Boom, PharmD   Positive urine culture Treated with cephalexin, organism sensitive to the same and no further patient follow-up is required at this time.  Hazle Nordmann 05/29/2022, 10:56 AM

## 2022-05-29 NOTE — Telephone Encounter (Signed)
This nurse attempted to reach patient related to phone message requesting to know if the symptoms she has are from Covid or Chemotherapy.  This nurse attempted to find out the symptoms the patient is having.  No answer.

## 2022-05-30 ENCOUNTER — Ambulatory Visit (INDEPENDENT_AMBULATORY_CARE_PROVIDER_SITE_OTHER): Payer: BC Managed Care – PPO | Admitting: Family

## 2022-05-30 ENCOUNTER — Other Ambulatory Visit (HOSPITAL_COMMUNITY)
Admission: RE | Admit: 2022-05-30 | Discharge: 2022-05-30 | Disposition: A | Discharge status: Home / Self Care | Payer: BC Managed Care – PPO | Source: Ambulatory Visit | Attending: Family | Admitting: Family

## 2022-05-30 ENCOUNTER — Ambulatory Visit
Admission: RE | Admit: 2022-05-30 | Discharge: 2022-05-30 | Disposition: A | Discharge status: Home / Self Care | Payer: BC Managed Care – PPO | Source: Ambulatory Visit | Attending: Internal Medicine | Admitting: Internal Medicine

## 2022-05-30 ENCOUNTER — Other Ambulatory Visit (HOSPITAL_COMMUNITY): Payer: Self-pay

## 2022-05-30 ENCOUNTER — Telehealth: Payer: Self-pay | Admitting: Radiation Therapy

## 2022-05-30 ENCOUNTER — Inpatient Hospital Stay: Admission: RE | Admit: 2022-05-30 | Payer: BC Managed Care – PPO | Source: Ambulatory Visit

## 2022-05-30 ENCOUNTER — Encounter: Payer: Self-pay | Admitting: Family

## 2022-05-30 ENCOUNTER — Other Ambulatory Visit: Payer: Self-pay | Admitting: Physician Assistant

## 2022-05-30 VITALS — BP 128/68 | HR 73 | Temp 97.7°F | Ht 68.0 in | Wt 248.2 lb

## 2022-05-30 DIAGNOSIS — Z09 Encounter for follow-up examination after completed treatment for conditions other than malignant neoplasm: Secondary | ICD-10-CM

## 2022-05-30 DIAGNOSIS — C3492 Malignant neoplasm of unspecified part of left bronchus or lung: Secondary | ICD-10-CM

## 2022-05-30 DIAGNOSIS — C3490 Malignant neoplasm of unspecified part of unspecified bronchus or lung: Secondary | ICD-10-CM

## 2022-05-30 DIAGNOSIS — D649 Anemia, unspecified: Secondary | ICD-10-CM

## 2022-05-30 DIAGNOSIS — N9089 Other specified noninflammatory disorders of vulva and perineum: Secondary | ICD-10-CM | POA: Diagnosis not present

## 2022-05-30 DIAGNOSIS — N3000 Acute cystitis without hematuria: Secondary | ICD-10-CM | POA: Diagnosis not present

## 2022-05-30 DIAGNOSIS — C7931 Secondary malignant neoplasm of brain: Secondary | ICD-10-CM

## 2022-05-30 MED ORDER — ONDANSETRON HCL 4 MG PO TABS
4.0000 mg | ORAL_TABLET | Freq: Four times a day (QID) | ORAL | 2 refills | Status: DC | PRN
  Filled 2022-05-30: qty 30, 8d supply, fill #0

## 2022-05-30 MED ORDER — GADOPICLENOL 0.5 MMOL/ML IV SOLN: 10.0000 mL | Freq: Once | INTRAVENOUS | Status: DC | PRN

## 2022-05-30 MED ORDER — HYDROCODONE-ACETAMINOPHEN 5-325 MG PO TABS
1.0000 | ORAL_TABLET | Freq: Four times a day (QID) | ORAL | 0 refills | Status: DC | PRN
  Filled 2022-05-30: qty 30, 4d supply, fill #0

## 2022-05-30 MED ORDER — FLUCONAZOLE 150 MG PO TABS
150.0000 mg | ORAL_TABLET | ORAL | 0 refills | Status: DC | PRN
  Filled 2022-05-30: qty 3, 6d supply, fill #0

## 2022-05-30 MED ORDER — SODIUM CHLORIDE 0.9% FLUSH
10.0000 mL | INTRAVENOUS | Status: DC | PRN
  Administered 2022-05-30: 10 mL via INTRAVENOUS

## 2022-05-30 MED ORDER — HEPARIN SOD (PORK) LOCK FLUSH 100 UNIT/ML IV SOLN
500.0000 [IU] | Freq: Once | INTRAVENOUS | Status: AC
  Administered 2022-05-30: 500 [IU] via INTRAVENOUS

## 2022-05-30 MED ORDER — LIDOCAINE 5 % EX OINT
1.0000 | TOPICAL_OINTMENT | CUTANEOUS | 1 refills | Status: DC | PRN
  Filled 2022-05-30: qty 35.44, 14d supply, fill #0

## 2022-05-30 MED ORDER — PROCHLORPERAZINE MALEATE 10 MG PO TABS
10.0000 mg | ORAL_TABLET | Freq: Four times a day (QID) | ORAL | 2 refills | Status: DC | PRN
  Filled 2022-05-30: qty 30, 8d supply, fill #0

## 2022-05-30 NOTE — Progress Notes (Signed)
Subjective:    Patient ID: Carolyn Hooper, female    DOB: 06/07/1960, 62 y.o.   MRN: [id number]  Chief Complaint  Patient presents with   Hospitalization Follow-up   Pt presents to the office today for hospital follow up. She went to the ED on 05/26/22 and diagnosed with UTI, COVID, and lesion on her labia. Told to follow up with PCP.   She was given Keflex. She continues to having burning because the lesion on her labia.  Urine culture positive for E coli.   She was also diagnosed with anemia and worried about a bleed.  Dysuria  This is a new problem. The current episode started 1 to 4 weeks ago. The problem occurs intermittently. The problem has been waxing and waning. The quality of the pain is described as burning. The pain is mild. There has been no fever. Associated symptoms include hematuria and urgency. Pertinent negatives include no flank pain, frequency, nausea or vomiting. She has tried antibiotics for the symptoms. The treatment provided moderate relief.      Review of Systems  Gastrointestinal:  Negative for nausea and vomiting.  Genitourinary:  Positive for dysuria, hematuria and urgency. Negative for flank pain and frequency.  All other systems reviewed and are negative.  Family History  Problem Relation Age of Onset   COPD Mother    Heart disease Father    Diabetes Father    Kidney disease Father    Arthritis Father        RA   Liver disease Father    Social History   Socioeconomic History   Marital status: Divorced    Spouse name: Not on file   Number of children: Not on file   Years of education: Not on file   Highest education level: Not on file  Occupational History   Not on file  Tobacco Use   Smoking status: Former    Packs/day: .68    Types: Cigarettes    Quit date: 02/11/2022    Years since quitting: 0.2   Smokeless tobacco: Never  Vaping Use   Vaping Use: Never used  Substance and Sexual Activity   Alcohol use: Yes    Comment: very  rare   Drug use: No   Sexual activity: Not on file  Other Topics Concern   Not on file  Social History Narrative   Not on file   Social Determinants of Health   Financial Resource Strain: Not on file  Food Insecurity: No Food Insecurity (02/13/2022)   Hunger Vital Sign    Worried About Running Out of Food in the Last Year: Never true    Ran Out of Food in the Last Year: Never true  Transportation Needs: No Transportation Needs (05/29/2022)   PRAPARE - Hydrologist (Medical): No    Lack of Transportation (Non-Medical): No  Physical Activity: Not on file  Stress: Not on file  Social Connections: Not on file       Objective:   Physical Exam Vitals reviewed.  Constitutional:      General: She is not in acute distress.    Appearance: She is well-developed.  HENT:     Head: Normocephalic and atraumatic.  Eyes:     Pupils: Pupils are equal, round, and reactive to light.  Neck:     Thyroid: No thyromegaly.  Cardiovascular:     Rate and Rhythm: Normal rate and regular rhythm.     Heart sounds: Normal  heart sounds. No murmur heard. Pulmonary:     Effort: Pulmonary effort is normal. No respiratory distress.     Breath sounds: Normal breath sounds. No wheezing.  Abdominal:     General: Bowel sounds are normal. There is no distension.     Palpations: Abdomen is soft.     Tenderness: There is no abdominal tenderness.  Genitourinary:      Comments: Labia very erythemas, ulcer like lesion on bilateral , extreme tenderness and swelling Musculoskeletal:        General: No tenderness. Normal range of motion.     Cervical back: Normal range of motion and neck supple.  Skin:    General: Skin is warm and dry.  Neurological:     Mental Status: She is alert and oriented to person, place, and time.     Cranial Nerves: No cranial nerve deficit.     Deep Tendon Reflexes: Reflexes are normal and symmetric.  Psychiatric:        Behavior: Behavior normal.         Thought Content: Thought content normal.        Judgment: Judgment normal.     BP 128/68   Pulse 73   Temp 97.7 F (36.5 C) (Temporal)   Ht '5\' 8"'$  (1.727 m)   Wt 248 lb 3.2 oz (112.6 kg)   SpO2 98%   BMI 37.74 kg/m        Assessment & Plan:  Carolyn Hooper comes in today with chief complaint of Hospitalization Follow-up   Diagnosis and orders addressed:  1. Labial lesion - Cytology (oral, anal, urethral) ancillary only - lidocaine (XYLOCAINE) 5 % ointment; Apply topically as needed.  Dispense: 60 g; Refill: 1 - Ambulatory referral to Gynecology - CMP14+EGFR - CBC with Differential/Platelet - HYDROcodone-acetaminophen (NORCO) 5-325 MG tablet; Take 1 - 2 tablets by mouth every 6 (six) hours as needed for moderate pain.  Dispense: 30 tablet; Refill: 0  2. Hospital discharge follow-up - CMP14+EGFR - CBC with Differential/Platelet  3. Anemia, unspecified type - CMP14+EGFR - CBC with Differential/Platelet  4. Acute cystitis without hematuria - CMP14+EGFR - CBC with Differential/Platelet  5. Non-small cell lung cancer, unspecified laterality ([place]) - CMP14+EGFR - CBC with Differential/Platelet  6. 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 I have placed stat referral to GYN for biopsy.  I have sent Norco for pain and Lidocaine to help with pain.  Labs pending Health Maintenance reviewed Diet and exercise encouraged Approx 42 mins spent with patient educating, testing, and chart review.   Follow up plan: Keep chronic follow up   Evelina Dun, FNP

## 2022-05-30 NOTE — Telephone Encounter (Signed)
I received a call from [place], Carolyn Hooper is there for her brain MRI but unable to complete the scan due to nausea. She has requested to reschedule and is asking for medication to help with her nausea prior to attempting the scan again. I have notified Dr. Mauri Reading and his nurse, Shelle Iron RN, of this request.   Mont Dutton R.T(R)(T) Radiation Special Procedures Navigator

## 2022-05-31 ENCOUNTER — Other Ambulatory Visit (HOSPITAL_COMMUNITY): Payer: Self-pay

## 2022-05-31 ENCOUNTER — Encounter: Payer: Self-pay | Admitting: Internal Medicine

## 2022-05-31 ENCOUNTER — Telehealth: Payer: Self-pay | Admitting: Medical Oncology

## 2022-05-31 LAB — CMP14+EGFR
ALT: 86 IU/L — ABNORMAL HIGH (ref 0–32)
AST: 44 IU/L — ABNORMAL HIGH (ref 0–40)
Albumin/Globulin Ratio: 1.4 (ref 1.2–2.2)
Albumin: 3.7 g/dL — ABNORMAL LOW (ref 3.9–4.9)
Alkaline Phosphatase: 73 IU/L (ref 44–121)
BUN/Creatinine Ratio: 21 (ref 12–28)
BUN: 15 mg/dL (ref 8–27)
Bilirubin Total: 0.6 mg/dL (ref 0.0–1.2)
CO2: 19 mmol/L — ABNORMAL LOW (ref 20–29)
Calcium: 9 mg/dL (ref 8.7–10.3)
Chloride: 99 mmol/L (ref 96–106)
Creatinine, Ser: 0.71 mg/dL (ref 0.57–1.00)
Globulin, Total: 2.6 g/dL (ref 1.5–4.5)
Glucose: 319 mg/dL — ABNORMAL HIGH (ref 70–99)
Potassium: 3.8 mmol/L (ref 3.5–5.2)
Sodium: 137 mmol/L (ref 134–144)
Total Protein: 6.3 g/dL (ref 6.0–8.5)
eGFR: 96 mL/min/{1.73_m2} (ref >59)

## 2022-05-31 LAB — CYTOLOGY, (ORAL, ANAL, URETHRAL) ANCILLARY ONLY
Comment: NEGATIVE
Trichomonas: NEGATIVE

## 2022-05-31 LAB — CBC WITH DIFFERENTIAL/PLATELET
Basophils Absolute: 0 10*3/uL (ref 0.0–0.2)
Basos: 1 %
EOS (ABSOLUTE): 0 10*3/uL (ref 0.0–0.4)
Eos: 1 %
Hematocrit: 26.1 % — ABNORMAL LOW (ref 34.0–46.6)
Hemoglobin: 8.4 g/dL — CL (ref 11.1–15.9)
Immature Grans (Abs): 0.1 10*3/uL (ref 0.0–0.1)
Immature Granulocytes: 3 %
Lymphocytes Absolute: 1.1 10*3/uL (ref 0.7–3.1)
Lymphs: 29 %
MCH: 32.6 pg (ref 26.6–33.0)
MCHC: 32.2 g/dL (ref 31.5–35.7)
MCV: 101 fL — ABNORMAL HIGH (ref 79–97)
Monocytes Absolute: 0.4 10*3/uL (ref 0.1–0.9)
Monocytes: 10 %
NRBC: 4 % — ABNORMAL HIGH (ref 0–0)
Neutrophils Absolute: 2.2 10*3/uL (ref 1.4–7.0)
Neutrophils: 56 %
Platelets: 118 10*3/uL — ABNORMAL LOW (ref 150–450)
RBC: 2.58 x10E6/uL — CL (ref 3.77–5.28)
RDW: 19.2 % — ABNORMAL HIGH (ref 11.7–15.4)
WBC: 3.8 10*3/uL (ref 3.4–10.8)

## 2022-05-31 NOTE — Telephone Encounter (Signed)
Multiple questions answered about medications to take for constipation and anxiety and nausea for antiemetic to complete MRI brain. She got nauseated on the MRI scanner so it has been r/s.  Per Cassie I instructed Staley to take the following.  Constipation-Suggested senakot -s  Nausea -take xanax 30 mins prior to MRI and zofran 15 mins before.   Labial lesions - Per Evelina Dun , I instructed pt to pick up  Sitz bath supplies at [facility].

## 2022-06-03 ENCOUNTER — Other Ambulatory Visit (HOSPITAL_COMMUNITY): Payer: Self-pay | Admitting: Emergency Medicine

## 2022-06-03 ENCOUNTER — Telehealth: Payer: Self-pay | Admitting: Family

## 2022-06-03 ENCOUNTER — Inpatient Hospital Stay: Payer: BC Managed Care – PPO

## 2022-06-03 ENCOUNTER — Telehealth: Payer: Self-pay | Admitting: Medical Oncology

## 2022-06-03 ENCOUNTER — Other Ambulatory Visit (HOSPITAL_COMMUNITY): Payer: Self-pay

## 2022-06-03 ENCOUNTER — Inpatient Hospital Stay: Payer: BC Managed Care – PPO | Admitting: Internal Medicine

## 2022-06-03 NOTE — Telephone Encounter (Signed)
Lmtcb.

## 2022-06-03 NOTE — Telephone Encounter (Signed)
Pt wants to know if she can get a refill on cephALEXin (KEFLEX) 500 MG capsule. Pt finished the rx last night. She feels better but bladder infection not gone. Use [employer]. Pt aware Alyse Low is not here and will address tomorrow or dod Please call back

## 2022-06-03 NOTE — Telephone Encounter (Signed)
NTBS for repeat urine sample

## 2022-06-03 NOTE — Telephone Encounter (Signed)
Her labial lesions are better. Pain is now down to 2/10.

## 2022-06-03 NOTE — Progress Notes (Unsigned)
[facility] OFFICE PROGRESS NOTE  [name], [place] [address]  DIAGNOSIS: Stage IV (T3, N0, M1b) non-small cell lung cancer, adenosquamous carcinoma presented with large medial left lower lobe lung mass with bilateral pulmonary nodules and metastatic disease to the brain diagnosed in November 2023   PRIOR THERAPY: Status post SRS to brain metastasis completed March 26, 2022   CURRENT THERAPY: Systemic chemotherapy with carboplatin for AUC of 5, Alimta 500 Mg/M2 and Keytruda 200 Mg IV every 3 weeks status post 4 cycles. First dose was given on March 06, 2022.  Starting from cycle #5, the patient will start maintenance Alimta and Keytruda IV every 3 weeks.  INTERVAL HISTORY: Carolyn Hooper 62 y.o. female returns to the clinic today for a follow up visit accompanied by her son. The patient is feeling fair since last being seen, the patient states she is doing fairly well except she had COVID-19 on 05/21/22. She also had UTI and labial lesions which improved.  She was treated with Keflex.  He had a fever with COVID but otherwise she denies any fever or chills.  She sometimes sweats at night due to a warm sleeping environment.  She states generally speaking, her breathing is "okay".  But if she gets "physical" she will get winded.  She is still having intermittent dizzy spells with standing too quickly.  She is not taking in a hypertensives. She states that she has checked her blood sugar during these episodes and has had low blood pressure during those times.  Of note, the patient's sugar tends to be elevated on her weekly labs.  I discussed this with her.  She states that she had an A1c performed by her PCP last month.  From reviewing the chart, it looks like that the PCP increase her metformin to 1000 mg twice daily.  I reviewed this with the patient.  It sounds like she may still be taking 500 mg.  He does not have a glucometer at home.  She states when  she was on her 1000 mg of metformin in the past, that she had shakiness.    She had some episodes of queasiness when she was taking antibiotics and other medications.  She denies vomiting, diarrhea, or constipation.  Denies any headache or visual changes. She is scheduled for a repeat brain MRI on 06/06/22. She has some speech changes secondary to her metastatic disease to the brain. She is seeing neurology within the week She is here today for evaluation and repeat blood work before starting cycle #5.     MEDICAL HISTORY: Past Medical History:  Diagnosis Date   Abscess    hydroadenitis superativa   Anxiety    DM (diabetes mellitus) ([place])    Hyperinsulinemia    Hypertension 1983   gestational - pre-eclampsia   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[facility])    Neuroma    audiotry neuroma   Seizure ([place])    Vertigo     ALLERGIES:  is allergic to adhesive [tape] and statins.  MEDICATIONS:  Current Outpatient Medications  Medication Sig Dispense Refill   albuterol (VENTOLIN HFA) 108 (90 Base) MCG/ACT inhaler Inhale 2 puffs into the lungs every 6 (six) hours as needed. 18 g 2   ALPRAZolam (XANAX) 0.5 MG tablet Take 1 tablet (0.5 mg total) by mouth 2 (two) times daily as needed for anxiety. 30 tablet 5   cephALEXin (KEFLEX) 500 MG capsule Take 1 capsule (500 mg  total) by mouth 2 (two) times daily. 14 capsule 0   clonazePAM (KLONOPIN) 0.5 MG tablet Take 0.5 tablets (0.25 mg total) by mouth at bedtime. For anxiety (Patient taking differently: Take 0.25 mg by mouth daily as needed for anxiety (and vertigo).) 30 tablet 2   dexamethasone (DECADRON) 1 MG tablet Take 3 tablets (3 mg total) by mouth daily with breakfast. 90 tablet 1   fluconazole (DIFLUCAN) 150 MG tablet Take 1 tablet (150 mg total) by mouth every three (3) days as needed. 3 tablet 0   folic acid (FOLVITE) 1 MG tablet Take 1 tablet (1 mg total) by mouth daily. 30 tablet 1   HYDROcodone-acetaminophen (NORCO) 5-325 MG tablet Take 1 - 2  tablets by mouth every 6 (six) hours as needed for moderate pain. 30 tablet 0   levETIRAcetam (KEPPRA) 1000 MG tablet Take 1 tablet (1,000 mg total) by mouth 2 (two) times daily. 60 tablet 1   lidocaine (XYLOCAINE) 5 % ointment Apply topically as needed. 60 g 1   lidocaine-prilocaine (EMLA) cream Apply to port prior to access 30 g 0   magic mouthwash (nystatin, diphenhydrAMINE, alum & mag hydroxide) suspension mixture Take 5 mLs by mouth 3 (three) times daily as needed for mouth pain. 240 mL 1   metFORMIN (GLUCOPHAGE) 1000 MG tablet Take 1 tablet (1,000 mg total) by mouth 2 (two) times daily. 180 tablet 1   metFORMIN (GLUCOPHAGE) 850 MG tablet Take 425-850 mg by mouth See admin instructions. 425 mg - 850 mg with meals, depending on how much carbs and sugars Pt eats     nystatin (MYCOSTATIN) 100000 UNIT/ML suspension TAKE 5 ML BY MOUTH  4 TIMES DAILY AS NEEDED 60 mL 0   ondansetron (ZOFRAN) 4 MG tablet Take 1 tablet (4 mg total) by mouth every 6 (six) hours as needed for nausea. 30 tablet 2   ondansetron (ZOFRAN) 8 MG tablet Take 1 tablet (8 mg total) by mouth 3 (three) times daily. 20 tablet 1   prochlorperazine (COMPAZINE) 10 MG tablet Take 1 tablet (10 mg total) by mouth every 6 (six) hours as needed. 30 tablet 2   No current facility-administered medications for this visit.    SURGICAL HISTORY:  Past Surgical History:  Procedure Laterality Date   ABDOMINAL HYSTERECTOMY  2006   remaining left ovary   BRONCHIAL BIOPSY  02/19/2022   Procedure: BRONCHIAL BIOPSIES;  Surgeon: Garner Nash, DO;  Location: [facility];  Service: Pulmonary;;   BRONCHIAL BRUSHINGS  02/19/2022   Procedure: BRONCHIAL BRUSHINGS;  Surgeon: Garner Nash, DO;  Location: [facility];  Service: Pulmonary;;   BRONCHIAL NEEDLE ASPIRATION BIOPSY  02/19/2022   Procedure: BRONCHIAL NEEDLE ASPIRATION BIOPSIES;  Surgeon: Garner Nash, DO;  Location: [facility];  Service: Pulmonary;;   IR IMAGING GUIDED PORT  INSERTION  03/29/2022   SHOULDER SURGERY Right 1960s    REVIEW OF SYSTEMS:   Review of Systems  Constitutional: Negative for appetite change, chills, fatigue, and fever.  HENT: Negative for mouth sores, nosebleeds, sore throat and trouble swallowing.  Eyes: Negative for eye problems and icterus.  Respiratory: Positive for dyspnea on exertion.  Negative for hemoptysis, shortness of breath and wheezing.   Cardiovascular: Negative for chest pain and leg swelling.  Gastrointestinal: Negative for abdominal pain, constipation, diarrhea, nausea and vomiting.  Genitourinary: Negative for bladder incontinence, difficulty urinating, dysuria (undergoing treatment for UTI), frequency and hematuria.  Improving labial lesions.  Musculoskeletal: Negative for back pain, gait problem, neck pain and  neck stiffness.  Skin: Negative for itching and rash.  Neurological: Negative for dizziness, extremity weakness, gait problem, headaches, light-headedness and seizures.  Hematological: Negative for adenopathy. Does not bruise/bleed easily.  Psychiatric/Behavioral: Positive for speech disturbance. Negative for confusion, depression and sleep disturbance. The patient is not nervous/anxious.    PHYSICAL EXAMINATION:  Blood pressure 129/72, pulse 76, temperature 97.9 F (36.6 C), resp. rate 19, height 5\' 8"  (1.727 m), weight 249 lb 1.6 oz (113 kg), SpO2 97 %.  ECOG PERFORMANCE STATUS: 1-2  Physical Exam  Constitutional: Oriented to person, place, and time and well-developed, well-nourished, and in no distress.  HENT:  Head: Normocephalic and atraumatic.  Mouth/Throat: Oropharynx is clear and moist. No oropharyngeal exudate.  Eyes: Conjunctivae are normal. Right eye exhibits no discharge. Left eye exhibits no discharge. No scleral icterus.  Neck: Normal range of motion. Neck supple.  Cardiovascular: Normal rate, regular rhythm, normal heart sounds and intact distal pulses.   Pulmonary/Chest: Effort normal and  breath sounds normal. No respiratory distress. No wheezes. No rales.  Abdominal: Soft. Bowel sounds are normal. Exhibits no distension and no mass. There is no tenderness.  Musculoskeletal: Normal range of motion. Exhibits no edema.  Lymphadenopathy:    No cervical adenopathy.  Neurological: Alert and oriented to person, place, and time. Exhibits normal muscle tone. Gait normal. Coordination normal.  Skin: Skin is warm and dry. No rash noted. Not diaphoretic. No erythema. No pallor.  Psychiatric: Positive for slow speech and trouble with word finding.  Mood, memory and judgment normal.  Vitals reviewed.  LABORATORY DATA: Lab Results  Component Value Date   WBC 6.9 06/05/2022   HGB 9.6 (L) 06/05/2022   HCT 29.3 (L) 06/05/2022   MCV 107.3 (H) 06/05/2022   PLT 177 06/05/2022      Chemistry      Component Value Date/Time   NA 139 06/05/2022 0949   NA 137 05/30/2022 1447   K 3.8 06/05/2022 0949   CL 104 06/05/2022 0949   CO2 27 06/05/2022 0949   BUN 21 06/05/2022 0949   BUN 15 05/30/2022 1447   CREATININE 0.62 06/05/2022 0949      Component Value Date/Time   CALCIUM 9.1 06/05/2022 0949   ALKPHOS 51 06/05/2022 0949   AST 22 06/05/2022 0949   ALT 68 (H) 06/05/2022 0949   BILITOT 0.5 06/05/2022 0949       RADIOGRAPHIC STUDIES:  DG Chest Port 1 View  Result Date: 05/26/2022 CLINICAL DATA:  Shortness of breath.  Lung cancer EXAM: PORTABLE CHEST 1 VIEW COMPARISON:  02/19/2022, 05/13/2022 FINDINGS: Right-sided chest port terminates at the level of the distal SVC. Heart size within normal limits. Aortic atherosclerosis. Medial left lower lobe lung mass and small bilateral pulmonary nodules, better seen by recent CT. No new focal airspace consolidation. No pleural effusion or pneumothorax. IMPRESSION: 1. No active disease. 2. Medial left lower lobe lung mass and small bilateral pulmonary nodules, better seen by recent CT. Electronically Signed   By: Davina Poke D.O.   On:  05/26/2022 12:57   CT Chest W Contrast  Result Date: 05/14/2022 CLINICAL DATA:  Staging non-small-cell lung cancer. Ongoing chemotherapy. Finished radiation November 2023. * Tracking Code: BO * EXAM: CT CHEST, ABDOMEN, AND PELVIS WITH CONTRAST TECHNIQUE: Multidetector CT imaging of the chest, abdomen and pelvis was performed following the standard protocol during bolus administration of intravenous contrast. RADIATION DOSE REDUCTION: This exam was performed according to the departmental dose-optimization program which includes automated exposure control, adjustment  of the mA and/or kV according to patient size and/or use of iterative reconstruction technique. CONTRAST:  141mL OMNIPAQUE IOHEXOL 300 MG/ML  SOLN COMPARISON:  CT 02/11/2022.  PET-CT 03/06/2022 FINDINGS: CT CHEST FINDINGS Cardiovascular: Right upper chest port. The port is accessed. Heart is nonenlarged. No pericardial effusion. Coronary artery calcifications are seen. Please correlate for other coronary risk factors. Thoracic aorta has a normal course and caliber with scattered calcified plaque. Prominent calcified plaque also seen along the left subclavian artery origin. Mediastinum/Nodes: No specific abnormal lymph node enlargement identified in the axillary region, hilum or mediastinum. Few small hilar and mediastinal nodes are seen, nonpathologic by size criteria at under 1 cm size in short axis. These are similar to the prior CT scan contrast. Lungs/Pleura: Lungs are without consolidation, pneumothorax or effusion. There is some scattered areas of scarring atelectatic change. Multiple lung nodules are once again identified there are of 6 right-sided lung nodules. Specific lesions will be followed for continuity. This includes the dominant right upper lobe nodule laterally which on the prior CT scan measured 9 mm and today on series 4, image 52 measures 9 by 8 mm. In the left lung there is a confluent masslike area in the medial left lower lobe  which previously on PET-CT and measured 5.2 x 4.4 cm and standard CT 5.4 x 4.1 cm., today on series 4, image 106 measures 5.1 by 4.5 cm. Small nodule also seen such as superior segment left lower lobe on image 80 and image 69 which are stable. Tiny left upper lobe nodule on image 31 is also stable. No new lung nodules. Musculoskeletal: Scattered degenerative changes noted along the spine. Bridging osteophytes and syndesmophytes. CT ABDOMEN PELVIS FINDINGS Hepatobiliary: Fatty liver infiltration. There is a small low-attenuation lesion identified in segment 4 near the margin of the portal vein measuring 11 mm on series 2, image 61 which was not clearly seen previously. Possibilities include a new nodule versus more focal fat deposition. Patent portal vein. Gallbladder is dilated. There is a large stone in the neck of the gallbladder measuring 4.1 cm. There is also presumed sludge in the gallbladder which was seen previously. The appearance of the gallbladder is unchanged from prior. Pancreas: Unremarkable. No pancreatic ductal dilatation or surrounding inflammatory changes. Spleen: Spleen measures 13.6 cm in longitudinal length. Preserved enhancement. Adrenals/Urinary Tract: Adrenal glands are preserved. No enhancing renal mass or collecting system dilatation. Ureters have normal course and caliber extending down to the bladder. Preserved contours of the urinary bladder. There are low-lying pelvic floor structures. Please correlate with pelvic prolapse. Stomach/Bowel: Moderate scattered colonic stool. Few scattered left-sided colonic diverticula. No obstruction, free air. Moderate debris in the stomach. Small bowel is nondilated. Vascular/Lymphatic: Normal caliber aorta and IVC with scattered vascular calcifications. No specific abnormal lymph node enlargement identified in the abdomen and pelvis. A few small nodes are seen, not pathologic by size criteria. Reproductive: Status post hysterectomy. No adnexal masses.  Other: Anasarca.  Diffuse muscle atrophy.  No ascites. Musculoskeletal: Scattered degenerative changes of the spine and pelvis. There is a sclerotic lesion along the lateral margin of the left femoral head, unchanged from previous. IMPRESSION: CT CHEST: 1. Stable bilateral pulmonary nodules including the large mass in the left lower lobe. 2. No new mass lesion, fluid collection or lymph node enlargement in the thorax. CT ABDOMEN AND PELVIS: 1. 11 mm low-attenuation lesion in segment 4 of the liver was not clearly seen previously. Possibilities include a new nodule versus more focal fat  deposition. Underlying diffuse fatty liver infiltration and mild splenic enlargement. Etiology could be confirmed with dynamic liver MRI as clinically appropriate 2. Stable dilated gallbladder with sludge and a large 4 cm stone. The stone is towards the neck. 3. Low-lying pelvic floor structures, please correlate for pelvic prolapse Aortic Atherosclerosis (ICD10-I70.0). Electronically Signed   By: Jill Side M.D.   On: 05/14/2022 14:16   CT Abdomen Pelvis W Contrast  Result Date: 05/14/2022 CLINICAL DATA:  Staging non-small-cell lung cancer. Ongoing chemotherapy. Finished radiation November 2023. * Tracking Code: BO * EXAM: CT CHEST, ABDOMEN, AND PELVIS WITH CONTRAST TECHNIQUE: Multidetector CT imaging of the chest, abdomen and pelvis was performed following the standard protocol during bolus administration of intravenous contrast. RADIATION DOSE REDUCTION: This exam was performed according to the departmental dose-optimization program which includes automated exposure control, adjustment of the mA and/or kV according to patient size and/or use of iterative reconstruction technique. CONTRAST:  187mL OMNIPAQUE IOHEXOL 300 MG/ML  SOLN COMPARISON:  CT 02/11/2022.  PET-CT 03/06/2022 FINDINGS: CT CHEST FINDINGS Cardiovascular: Right upper chest port. The port is accessed. Heart is nonenlarged. No pericardial effusion. Coronary artery  calcifications are seen. Please correlate for other coronary risk factors. Thoracic aorta has a normal course and caliber with scattered calcified plaque. Prominent calcified plaque also seen along the left subclavian artery origin. Mediastinum/Nodes: No specific abnormal lymph node enlargement identified in the axillary region, hilum or mediastinum. Few small hilar and mediastinal nodes are seen, nonpathologic by size criteria at under 1 cm size in short axis. These are similar to the prior CT scan contrast. Lungs/Pleura: Lungs are without consolidation, pneumothorax or effusion. There is some scattered areas of scarring atelectatic change. Multiple lung nodules are once again identified there are of 6 right-sided lung nodules. Specific lesions will be followed for continuity. This includes the dominant right upper lobe nodule laterally which on the prior CT scan measured 9 mm and today on series 4, image 52 measures 9 by 8 mm. In the left lung there is a confluent masslike area in the medial left lower lobe which previously on PET-CT and measured 5.2 x 4.4 cm and standard CT 5.4 x 4.1 cm., today on series 4, image 106 measures 5.1 by 4.5 cm. Small nodule also seen such as superior segment left lower lobe on image 80 and image 69 which are stable. Tiny left upper lobe nodule on image 31 is also stable. No new lung nodules. Musculoskeletal: Scattered degenerative changes noted along the spine. Bridging osteophytes and syndesmophytes. CT ABDOMEN PELVIS FINDINGS Hepatobiliary: Fatty liver infiltration. There is a small low-attenuation lesion identified in segment 4 near the margin of the portal vein measuring 11 mm on series 2, image 61 which was not clearly seen previously. Possibilities include a new nodule versus more focal fat deposition. Patent portal vein. Gallbladder is dilated. There is a large stone in the neck of the gallbladder measuring 4.1 cm. There is also presumed sludge in the gallbladder which was  seen previously. The appearance of the gallbladder is unchanged from prior. Pancreas: Unremarkable. No pancreatic ductal dilatation or surrounding inflammatory changes. Spleen: Spleen measures 13.6 cm in longitudinal length. Preserved enhancement. Adrenals/Urinary Tract: Adrenal glands are preserved. No enhancing renal mass or collecting system dilatation. Ureters have normal course and caliber extending down to the bladder. Preserved contours of the urinary bladder. There are low-lying pelvic floor structures. Please correlate with pelvic prolapse. Stomach/Bowel: Moderate scattered colonic stool. Few scattered left-sided colonic diverticula. No obstruction, free air.  Moderate debris in the stomach. Small bowel is nondilated. Vascular/Lymphatic: Normal caliber aorta and IVC with scattered vascular calcifications. No specific abnormal lymph node enlargement identified in the abdomen and pelvis. A few small nodes are seen, not pathologic by size criteria. Reproductive: Status post hysterectomy. No adnexal masses. Other: Anasarca.  Diffuse muscle atrophy.  No ascites. Musculoskeletal: Scattered degenerative changes of the spine and pelvis. There is a sclerotic lesion along the lateral margin of the left femoral head, unchanged from previous. IMPRESSION: CT CHEST: 1. Stable bilateral pulmonary nodules including the large mass in the left lower lobe. 2. No new mass lesion, fluid collection or lymph node enlargement in the thorax. CT ABDOMEN AND PELVIS: 1. 11 mm low-attenuation lesion in segment 4 of the liver was not clearly seen previously. Possibilities include a new nodule versus more focal fat deposition. Underlying diffuse fatty liver infiltration and mild splenic enlargement. Etiology could be confirmed with dynamic liver MRI as clinically appropriate 2. Stable dilated gallbladder with sludge and a large 4 cm stone. The stone is towards the neck. 3. Low-lying pelvic floor structures, please correlate for pelvic  prolapse Aortic Atherosclerosis (ICD10-I70.0). Electronically Signed   By: Jill Side M.D.   On: 05/14/2022 14:16     ASSESSMENT/PLAN:  This is a very pleasant 62 year old Caucasian female diagnosed with stage IV (T3, N0, M1 B) non-small cell lung cancer, adenosquamous carcinoma.  She presented with a large medial left lower lobe lung mass with bilateral pulmonary nodules metastatic disease to the brain.  She was diagnosed in November 2023.   She completed SRS to the metastatic brain lesions on 03/26/2022.   The patient had developed seizures and was seen in the emergency room.  Her dose of Keppra was increased and she was also put on higher dose of Decadron.   She underwent 1 cycle of systemic chemotherapy and immunotherapy with carboplatin for an AUC of 5, Alimta 500 mg/m, Keytruda 20 mg IV every 3 weeks.  She is status post 4 cycles.    Labs were reviewed.  She will start cycle #5 today which will be maintenance Alimta and Keytruda.  Discussed with the patient that she does not need to come in for weekly labs anymore.  I reviewed the patient's PCPs recommendations for her diabetes.  Per chart review, because her A1c was elevated last month a recommended metformin 1000 mg twice daily.  I did let her know that her blood sugar tends to be elevated on her labs.  If she has concerns about the increased dose of metformin, I recommended she follow-up with her PCP about her concerns.  I also encouraged her to purchase a glucometer and should she develop any episodes of shakiness or hypoglycemia to check her blood sugar at home.  The patient is getting a brain MRI tomorrow to follow-up on her history of metastatic disease to the brain.  Regarding her episodic dizzy spells, this appears most consistent with orthostatic hypotension.  I encouraged the patient to hydrate well at home and purchase a blood pressure cuff and to check her blood pressure should she have recurrent symptoms.  If ever needed, we can  always arrange for IV fluids in the clinic and she was advised to call.  Regarding the labial lesions and UTI, she will continue to follow with her PCP.  Of note, regarding the labial lesions, it looks like the swab could not be checked for HSV.  The patient denies any history of HSV.  She has topical  numbing cream and has been using sitz bath's.  Should she have recurrent lesions, I mention to the patient the neck step may be to see a gynecologist.  The patient had some questions about if this were some superficial skin malignancy if that would show up on her CT, chest, abdomen, and pelvis.  I discussed that this likely would not show up her small skin lesions on her CT scans.  Will see her back for follow-up visit in 3 weeks for evaluation and repeat blood work before undergoing cycle #6.  The patient was advised to call immediately if she has any concerning symptoms in the interval. The patient voices understanding of current disease status and treatment options and is in agreement with the current care plan. All questions were answered. The patient knows to call the clinic with any problems, questions or concerns. We can certainly see the patient much sooner if necessary\   No orders of the defined types were placed in this encounter.   The total time spent in the appointment was 30-39 minutes.   Rusell Meneely L Carolyn Konczal, PA-C 06/05/22

## 2022-06-04 ENCOUNTER — Other Ambulatory Visit: Payer: Self-pay | Admitting: Physician Assistant

## 2022-06-04 ENCOUNTER — Encounter: Payer: Self-pay | Admitting: Internal Medicine

## 2022-06-04 ENCOUNTER — Other Ambulatory Visit (HOSPITAL_COMMUNITY): Payer: Self-pay

## 2022-06-04 DIAGNOSIS — K1379 Other lesions of oral mucosa: Secondary | ICD-10-CM

## 2022-06-04 MED ORDER — NYSTATIN 100000 UNIT/ML MT SUSP
5.0000 mL | Freq: Three times a day (TID) | OROMUCOSAL | 1 refills | Status: DC | PRN
  Filled 2022-06-04: qty 240, 16d supply, fill #0
  Filled 2022-06-24: qty 240, 16d supply, fill #1

## 2022-06-04 MED ORDER — CEPHALEXIN 500 MG PO CAPS
500.0000 mg | ORAL_CAPSULE | Freq: Two times a day (BID) | ORAL | 0 refills | Status: DC
  Filled 2022-06-04: qty 14, 7d supply, fill #0

## 2022-06-04 NOTE — Telephone Encounter (Signed)
Pt has been informed. She has no concerns at this time. 

## 2022-06-04 NOTE — Telephone Encounter (Signed)
Keflex Prescription sent to pharmacy   

## 2022-06-05 ENCOUNTER — Inpatient Hospital Stay: Payer: BC Managed Care – PPO

## 2022-06-05 ENCOUNTER — Other Ambulatory Visit: Payer: BC Managed Care – PPO

## 2022-06-05 ENCOUNTER — Inpatient Hospital Stay: Payer: BC Managed Care – PPO | Attending: Physician Assistant | Admitting: Physician Assistant

## 2022-06-05 VITALS — BP 129/72 | HR 76 | Temp 97.9°F | Resp 19 | Ht 68.0 in | Wt 249.1 lb

## 2022-06-05 DIAGNOSIS — Z79899 Other long term (current) drug therapy: Secondary | ICD-10-CM | POA: Diagnosis not present

## 2022-06-05 DIAGNOSIS — I10 Essential (primary) hypertension: Secondary | ICD-10-CM | POA: Diagnosis not present

## 2022-06-05 DIAGNOSIS — C3492 Malignant neoplasm of unspecified part of left bronchus or lung: Secondary | ICD-10-CM

## 2022-06-05 DIAGNOSIS — Z5112 Encounter for antineoplastic immunotherapy: Secondary | ICD-10-CM | POA: Diagnosis not present

## 2022-06-05 DIAGNOSIS — Z923 Personal history of irradiation: Secondary | ICD-10-CM | POA: Insufficient documentation

## 2022-06-05 DIAGNOSIS — Z7984 Long term (current) use of oral hypoglycemic drugs: Secondary | ICD-10-CM | POA: Diagnosis not present

## 2022-06-05 DIAGNOSIS — Z7952 Long term (current) use of systemic steroids: Secondary | ICD-10-CM | POA: Insufficient documentation

## 2022-06-05 DIAGNOSIS — C3432 Malignant neoplasm of lower lobe, left bronchus or lung: Secondary | ICD-10-CM | POA: Diagnosis not present

## 2022-06-05 DIAGNOSIS — C3490 Malignant neoplasm of unspecified part of unspecified bronchus or lung: Secondary | ICD-10-CM

## 2022-06-05 DIAGNOSIS — Z95828 Presence of other vascular implants and grafts: Secondary | ICD-10-CM

## 2022-06-05 DIAGNOSIS — E119 Type 2 diabetes mellitus without complications: Secondary | ICD-10-CM | POA: Insufficient documentation

## 2022-06-05 DIAGNOSIS — Z5111 Encounter for antineoplastic chemotherapy: Secondary | ICD-10-CM | POA: Diagnosis not present

## 2022-06-05 DIAGNOSIS — C7931 Secondary malignant neoplasm of brain: Secondary | ICD-10-CM | POA: Insufficient documentation

## 2022-06-05 DIAGNOSIS — Z87891 Personal history of nicotine dependence: Secondary | ICD-10-CM | POA: Insufficient documentation

## 2022-06-05 LAB — CMP (CANCER CENTER ONLY)
ALT: 68 U/L — ABNORMAL HIGH (ref 0–44)
AST: 22 U/L (ref 15–41)
Albumin: 3.6 g/dL (ref 3.5–5.0)
Alkaline Phosphatase: 51 U/L (ref 38–126)
Anion gap: 8 (ref 5–15)
BUN: 21 mg/dL (ref 8–23)
CO2: 27 mmol/L (ref 22–32)
Calcium: 9.1 mg/dL (ref 8.9–10.3)
Chloride: 104 mmol/L (ref 98–111)
Creatinine: 0.62 mg/dL (ref 0.44–1.00)
GFR, Estimated: >60 mL/min (ref >60)
Glucose, Bld: 245 mg/dL — ABNORMAL HIGH (ref 70–99)
Potassium: 3.8 mmol/L (ref 3.5–5.1)
Sodium: 139 mmol/L (ref 135–145)
Total Bilirubin: 0.5 mg/dL (ref 0.3–1.2)
Total Protein: 6.2 g/dL — ABNORMAL LOW (ref 6.5–8.1)

## 2022-06-05 LAB — CBC WITH DIFFERENTIAL (CANCER CENTER ONLY)
Abs Immature Granulocytes: 0.1 10*3/uL — ABNORMAL HIGH (ref 0.00–0.07)
Basophils Absolute: 0.1 10*3/uL (ref 0.0–0.1)
Basophils Relative: 1 %
Eosinophils Absolute: 0.1 10*3/uL (ref 0.0–0.5)
Eosinophils Relative: 2 %
HCT: 29.3 % — ABNORMAL LOW (ref 36.0–46.0)
Hemoglobin: 9.6 g/dL — ABNORMAL LOW (ref 12.0–15.0)
Immature Granulocytes: 2 %
Lymphocytes Relative: 38 %
Lymphs Abs: 2.6 10*3/uL (ref 0.7–4.0)
MCH: 35.2 pg — ABNORMAL HIGH (ref 26.0–34.0)
MCHC: 32.8 g/dL (ref 30.0–36.0)
MCV: 107.3 fL — ABNORMAL HIGH (ref 80.0–100.0)
Monocytes Absolute: 0.7 10*3/uL (ref 0.1–1.0)
Monocytes Relative: 10 %
Neutro Abs: 3.3 10*3/uL (ref 1.7–7.7)
Neutrophils Relative %: 47 %
Platelet Count: 177 10*3/uL (ref 150–400)
RBC: 2.73 MIL/uL — ABNORMAL LOW (ref 3.87–5.11)
RDW: 22.9 % — ABNORMAL HIGH (ref 11.5–15.5)
WBC Count: 6.9 10*3/uL (ref 4.0–10.5)
nRBC: 1.6 % — ABNORMAL HIGH (ref 0.0–0.2)

## 2022-06-05 MED ORDER — SODIUM CHLORIDE 0.9 % IV SOLN
200.0000 mg | Freq: Once | INTRAVENOUS | Status: AC
  Administered 2022-06-05: 200 mg via INTRAVENOUS
  Filled 2022-06-05: qty 8

## 2022-06-05 MED ORDER — SODIUM CHLORIDE 0.9 % IV SOLN
500.0000 mg/m2 | Freq: Once | INTRAVENOUS | Status: AC
  Administered 2022-06-05: 1100 mg via INTRAVENOUS
  Filled 2022-06-05: qty 40

## 2022-06-05 MED ORDER — SODIUM CHLORIDE 0.9% FLUSH
10.0000 mL | Freq: Once | INTRAVENOUS | Status: AC
  Administered 2022-06-05: 10 mL

## 2022-06-05 MED ORDER — PROCHLORPERAZINE MALEATE 10 MG PO TABS
10.0000 mg | ORAL_TABLET | Freq: Once | ORAL | Status: AC
  Administered 2022-06-05: 10 mg via ORAL
  Filled 2022-06-05: qty 1

## 2022-06-05 MED ORDER — SODIUM CHLORIDE 0.9% FLUSH: 10.0000 mL | INTRAVENOUS | Status: DC | PRN

## 2022-06-05 MED ORDER — SODIUM CHLORIDE 0.9 % IV SOLN: Freq: Once | INTRAVENOUS | Status: AC

## 2022-06-05 MED ORDER — HEPARIN SOD (PORK) LOCK FLUSH 100 UNIT/ML IV SOLN: 500.0000 [IU] | Freq: Once | INTRAVENOUS | Status: DC | PRN

## 2022-06-05 NOTE — Patient Instructions (Signed)
[facility] 27% JUICE  [facility] AT [facility]  Discharge Instructions: Thank you for choosing [facility] to provide your oncology and hematology care.   If you have a lab appointment with the [facility], please go directly to the [facility] and check in at the registration area.   Wear comfortable clothing and clothing appropriate for easy access to any Portacath or PICC line.   We strive to give you quality time with your provider. You may need to reschedule your appointment if you arrive late (15 or more minutes).  Arriving late affects you and other patients whose appointments are after yours.  Also, if you miss three or more appointments without notifying the office, you may be dismissed from the clinic at the provider's discretion.      For prescription refill requests, have your pharmacy contact our office and allow 72 hours for refills to be completed.    Today you received the following chemotherapy and/or immunotherapy agents: pembrolizumab, pemetrexed   To help prevent nausea and vomiting after your treatment, we encourage you to take your nausea medication as directed.  BELOW ARE SYMPTOMS THAT SHOULD BE REPORTED IMMEDIATELY: *FEVER GREATER THAN 100.4 F (38 C) OR HIGHER *CHILLS OR SWEATING *NAUSEA AND VOMITING THAT IS NOT CONTROLLED WITH YOUR NAUSEA MEDICATION *UNUSUAL SHORTNESS OF BREATH *UNUSUAL BRUISING OR BLEEDING *URINARY PROBLEMS (pain or burning when urinating, or frequent urination) *BOWEL PROBLEMS (unusual diarrhea, constipation, pain near the anus) TENDERNESS IN MOUTH AND THROAT WITH OR WITHOUT PRESENCE OF ULCERS (sore throat, sores in mouth, or a toothache) UNUSUAL RASH, SWELLING OR PAIN  UNUSUAL VAGINAL DISCHARGE OR ITCHING   Items with * indicate a potential emergency and should be followed up as soon as possible or go to the Emergency Department if any problems should occur.  Please show  the CHEMOTHERAPY ALERT CARD or IMMUNOTHERAPY ALERT CARD at check-in to the Emergency Department and triage nurse.  Should you have questions after your visit or need to cancel or reschedule your appointment, please contact [facility]  Dept: [phone]  and follow the prompts.  Office hours are 8:00 a.m. to 4:30 p.m. Monday - Friday. Please note that voicemails left after 4:00 p.m. may not be returned until the following business day.  We are closed weekends and major holidays. You have access to a nurse at all times for urgent questions. Please call the main number to the clinic Dept: [phone] and follow the prompts.   For any non-urgent questions, you may also contact your provider using MyChart. We now offer e-Visits for anyone 92 and older to request care online for non-urgent symptoms. For details visit [email or web address].   Also download the MyChart app! Go to the app store, search "MyChart", open the app, select [facility], and log in with your MyChart username and password.

## 2022-06-06 ENCOUNTER — Ambulatory Visit
Admission: RE | Admit: 2022-06-06 | Discharge: 2022-06-06 | Disposition: A | Discharge status: Home / Self Care | Payer: BC Managed Care – PPO | Source: Ambulatory Visit | Attending: Internal Medicine | Admitting: Internal Medicine

## 2022-06-06 ENCOUNTER — Ambulatory Visit: Payer: BC Managed Care – PPO | Admitting: Speech Pathology

## 2022-06-06 ENCOUNTER — Ambulatory Visit: Payer: BC Managed Care – PPO | Admitting: Occupational Therapy

## 2022-06-06 DIAGNOSIS — D333 Benign neoplasm of cranial nerves: Secondary | ICD-10-CM | POA: Diagnosis not present

## 2022-06-06 MED ORDER — GADOPICLENOL 0.5 MMOL/ML IV SOLN
10.0000 mL | Freq: Once | INTRAVENOUS | Status: AC | PRN
  Administered 2022-06-06: 10 mL via INTRAVENOUS

## 2022-06-06 MED ORDER — HEPARIN SOD (PORK) LOCK FLUSH 100 UNIT/ML IV SOLN
500.0000 [IU] | Freq: Once | INTRAVENOUS | Status: AC
  Administered 2022-06-06: 500 [IU] via INTRAVENOUS

## 2022-06-06 MED ORDER — SODIUM CHLORIDE 0.9% FLUSH
10.0000 mL | INTRAVENOUS | Status: DC | PRN
  Administered 2022-06-06: 10 mL via INTRAVENOUS

## 2022-06-10 ENCOUNTER — Inpatient Hospital Stay: Payer: BC Managed Care – PPO

## 2022-06-10 ENCOUNTER — Other Ambulatory Visit: Payer: Self-pay

## 2022-06-10 ENCOUNTER — Inpatient Hospital Stay (HOSPITAL_BASED_OUTPATIENT_CLINIC_OR_DEPARTMENT_OTHER): Payer: BC Managed Care – PPO | Admitting: Internal Medicine

## 2022-06-10 VITALS — BP 112/73 | HR 91 | Temp 97.2°F | Resp 18 | Wt 241.9 lb

## 2022-06-10 DIAGNOSIS — C3490 Malignant neoplasm of unspecified part of unspecified bronchus or lung: Secondary | ICD-10-CM | POA: Diagnosis not present

## 2022-06-10 DIAGNOSIS — C7931 Secondary malignant neoplasm of brain: Secondary | ICD-10-CM

## 2022-06-10 DIAGNOSIS — E119 Type 2 diabetes mellitus without complications: Secondary | ICD-10-CM | POA: Diagnosis not present

## 2022-06-10 DIAGNOSIS — Z5111 Encounter for antineoplastic chemotherapy: Secondary | ICD-10-CM | POA: Diagnosis not present

## 2022-06-10 DIAGNOSIS — Z5112 Encounter for antineoplastic immunotherapy: Secondary | ICD-10-CM | POA: Diagnosis not present

## 2022-06-10 DIAGNOSIS — Z79899 Other long term (current) drug therapy: Secondary | ICD-10-CM | POA: Diagnosis not present

## 2022-06-10 DIAGNOSIS — C3432 Malignant neoplasm of lower lobe, left bronchus or lung: Secondary | ICD-10-CM | POA: Diagnosis not present

## 2022-06-10 DIAGNOSIS — Z87891 Personal history of nicotine dependence: Secondary | ICD-10-CM | POA: Diagnosis not present

## 2022-06-10 DIAGNOSIS — I10 Essential (primary) hypertension: Secondary | ICD-10-CM | POA: Diagnosis not present

## 2022-06-10 DIAGNOSIS — Z923 Personal history of irradiation: Secondary | ICD-10-CM | POA: Diagnosis not present

## 2022-06-10 DIAGNOSIS — Z7952 Long term (current) use of systemic steroids: Secondary | ICD-10-CM | POA: Diagnosis not present

## 2022-06-10 DIAGNOSIS — Z7984 Long term (current) use of oral hypoglycemic drugs: Secondary | ICD-10-CM | POA: Diagnosis not present

## 2022-06-10 NOTE — Progress Notes (Signed)
[facility] [place], [place] [address] [phone]   Interval Evaluation  Date of Service: 06/10/22 Patient Name: Carolyn Hooper Patient MRN: [id number] Patient DOB: 11/04/1960 Provider: Ventura Sellers, MD  Identifying Statement:  Carolyn Hooper is a 62 y.o. 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  She does describe ongoing issues with expressive language.  Mild tremors have spread to her right hand now.  There is some lightheaded/dizzy feeling that occurs upon standing, also new.  Otherwise denies seizures, headaches.  H+P (03/07/22) Patient presented to neurologic attention on 02/11/22 with episode of sudden inability to speak. This resolved after some hours, she returned to prior baseline.  CNS imaging demonstrated lesions in both frontal lobes c/w metastatic disease, given concurrent lung mass.    Medications: Current Outpatient Medications on File Prior to Visit  Medication Sig Dispense Refill   cephALEXin (KEFLEX) 500 MG capsule Take 1 capsule (500 mg total) by mouth 2 (two) times daily. 14 capsule 0   dexamethasone (DECADRON) 1 MG tablet Take 3 tablets (3 mg total) by mouth daily with breakfast. 90 tablet 1   fluconazole (DIFLUCAN) 150 MG tablet Take 1 tablet (150 mg total) by mouth every three (3) days as needed. 3 tablet 0   folic acid (FOLVITE) 1 MG tablet Take 1 tablet (1 mg total) by mouth daily. 30 tablet 1   levETIRAcetam (KEPPRA)  1000 MG tablet Take 1 tablet (1,000 mg total) by mouth 2 (two) times daily. 60 tablet 1   metFORMIN (GLUCOPHAGE) 1000 MG tablet Take 1 tablet (1,000 mg total) by mouth 2 (two) times daily. 180 tablet 1   albuterol (VENTOLIN HFA) 108 (90 Base) MCG/ACT inhaler Inhale 2 puffs into the lungs every 6 (six) hours as needed. (Patient not taking: Reported on 06/10/2022) 18 g 2   ALPRAZolam (XANAX) 0.5 MG tablet Take 1 tablet (0.5 mg total) by mouth 2 (two) times daily as needed for anxiety. (Patient not taking: Reported on 06/10/2022) 30 tablet 5   clonazePAM (KLONOPIN) 0.5 MG tablet Take 0.5 tablets (0.25 mg total) by mouth at bedtime. For anxiety (Patient not taking: Reported on 06/10/2022) 30 tablet 2   HYDROcodone-acetaminophen (NORCO) 5-325 MG tablet Take 1 - 2 tablets by mouth every 6 (six) hours as needed for moderate pain. (Patient not taking: Reported on 06/10/2022) 30 tablet 0   lidocaine (XYLOCAINE) 5 % ointment Apply topically as needed. (Patient not taking: Reported on 06/10/2022) 60 g 1   lidocaine-prilocaine (EMLA) cream Apply to port prior to access (Patient not taking: Reported on 06/10/2022) 30 g 0   magic mouthwash (nystatin, diphenhydrAMINE, alum & mag hydroxide) suspension mixture Take 5 mLs by mouth 3 (three) times daily as needed for mouth pain. (Patient not taking: Reported on 06/10/2022) 240 mL 1   ondansetron (ZOFRAN) 4 MG tablet Take 1 tablet (4 mg total) by mouth every 6 (six) hours as needed for nausea. (Patient not taking:  Reported on 06/10/2022) 30 tablet 2   ondansetron (ZOFRAN) 8 MG tablet Take 1 tablet (8 mg total) by mouth 3 (three) times daily. (Patient not taking: Reported on 06/10/2022) 20 tablet 1   prochlorperazine (COMPAZINE) 10 MG tablet Take 1 tablet (10 mg total) by mouth every 6 (six) hours as needed. (Patient not taking: Reported on 06/10/2022) 30 tablet 2   No current facility-administered medications on file prior to visit.    Allergies:  Allergies  Allergen Reactions    Adhesive [Tape] Dermatitis   Statins Other (See Comments)    Myalgia, itching   Past Medical History:  Past Medical History:  Diagnosis Date   Abscess    hydroadenitis superativa   Anxiety    DM (diabetes mellitus) ([place])    Hyperinsulinemia    Hypertension 1983   gestational - pre-eclampsia   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[facility])    Neuroma    audiotry neuroma   Seizure ([place])    Vertigo    Past Surgical History:  Past Surgical History:  Procedure Laterality Date   ABDOMINAL HYSTERECTOMY  2006   remaining left ovary   BRONCHIAL BIOPSY  02/19/2022   Procedure: BRONCHIAL BIOPSIES;  Surgeon: Garner Nash, DO;  Location: [facility];  Service: Pulmonary;;   BRONCHIAL BRUSHINGS  02/19/2022   Procedure: BRONCHIAL BRUSHINGS;  Surgeon: Garner Nash, DO;  Location: [facility];  Service: Pulmonary;;   BRONCHIAL NEEDLE ASPIRATION BIOPSY  02/19/2022   Procedure: BRONCHIAL NEEDLE ASPIRATION BIOPSIES;  Surgeon: Garner Nash, DO;  Location: [facility];  Service: Pulmonary;;   IR IMAGING GUIDED PORT INSERTION  03/29/2022   SHOULDER SURGERY Right 1960s   Social History:  Social History   Socioeconomic History   Marital status: Divorced    Spouse name: Not on file   Number of children: Not on file   Years of education: Not on file   Highest education level: Not on file  Occupational History   Not on file  Tobacco Use   Smoking status: Former    Packs/day: .68    Types: Cigarettes    Quit date: 02/11/2022    Years since quitting: 0.3   Smokeless tobacco: Never  Vaping Use   Vaping Use: Never used  Substance and Sexual Activity   Alcohol use: Yes    Comment: very rare   Drug use: No   Sexual activity: Not on file  Other Topics Concern   Not on file  Social History Narrative   Not on file   Social Determinants of Health   Financial Resource Strain: Not on file  Food Insecurity: No Food Insecurity (02/13/2022)   Hunger Vital Sign    Worried About Running  Out of Food in the Last Year: Never true    Ran Out of Food in the Last Year: Never true  Transportation Needs: No Transportation Needs (05/29/2022)   PRAPARE - Hydrologist (Medical): No    Lack of Transportation (Non-Medical): No  Physical Activity: Not on file  Stress: Not on file  Social Connections: Not on file  Intimate Partner Violence: Not At Risk (02/13/2022)   Humiliation, Afraid, Rape, and Kick questionnaire    Fear of Current or Ex-Partner: No    Emotionally Abused: No    Physically Abused: No    Sexually Abused: No   Family History:  Family History  Problem Relation Age of Onset   COPD Mother    Heart disease Father  Diabetes Father    Kidney disease Father    Arthritis Father        RA   Liver disease Father     Review of Systems: Constitutional: Doesn't report fevers, chills or abnormal weight loss Eyes: Doesn't report blurriness of vision Ears, nose, mouth, throat, and face: Doesn't report sore throat Respiratory: Doesn't report cough, dyspnea or wheezes Cardiovascular: Doesn't report palpitation, chest discomfort  Gastrointestinal:  Doesn't report nausea, constipation, diarrhea GU: Doesn't report incontinence Skin: Doesn't report skin rashes Neurological: Per HPI Musculoskeletal: Doesn't report joint pain Behavioral/Psych: Doesn't report anxiety  Physical Exam: Vitals:   06/10/22 1030  BP: 112/73  Pulse: 91  Resp: 18  Temp: (!) 97.2 F (36.2 C)  SpO2: 98%   KPS: 80. General: Alert, cooperative, pleasant, in no acute distress Head: Normal EENT: No conjunctival injection or scleral icterus.  Lungs: Resp effort normal Cardiac: Regular rate Abdomen: Non-distended abdomen Skin: No rashes cyanosis or petechiae. Extremities: No clubbing or edema  Neurologic Exam: Mental Status: Awake, alert, attentive to examiner. Oriented to self and environment. Language is fluent with intact comprehension.  Cranial Nerves: Visual  acuity is grossly normal. Visual fields are full. Extra-ocular movements intact. No ptosis. Face is symmetric Motor: Tone and bulk are normal. Power is full in both arms and legs. Reflexes are symmetric, no pathologic reflexes present.  Sensory: Intact to light touch Gait: Normal.   Labs: I have reviewed the data as listed    Component Value Date/Time   NA 139 06/05/2022 0949   NA 137 05/30/2022 1447   K 3.8 06/05/2022 0949   CL 104 06/05/2022 0949   CO2 27 06/05/2022 0949   GLUCOSE 245 (H) 06/05/2022 0949   BUN 21 06/05/2022 0949   BUN 15 05/30/2022 1447   CREATININE 0.62 06/05/2022 0949   CALCIUM 9.1 06/05/2022 0949   PROT 6.2 (L) 06/05/2022 0949   PROT 6.3 05/30/2022 1447   ALBUMIN 3.6 06/05/2022 0949   ALBUMIN 3.7 (L) 05/30/2022 1447   AST 22 06/05/2022 0949   ALT 68 (H) 06/05/2022 0949   ALKPHOS 51 06/05/2022 0949   BILITOT 0.5 06/05/2022 0949   GFRNONAA >60 06/05/2022 0949   GFRAA 93 09/21/2019 0807   Lab Results  Component Value Date   WBC 6.9 06/05/2022   NEUTROABS 3.3 06/05/2022   HGB 9.6 (L) 06/05/2022   HCT 29.3 (L) 06/05/2022   MCV 107.3 (H) 06/05/2022   PLT 177 06/05/2022   Imaging:  [facility] Clinician Interpretation: I have personally reviewed the CNS images as listed.  My interpretation, in the context of the patient's clinical presentation, is stable disease  MR BRAIN W WO CONTRAST  Result Date: 06/10/2022 CLINICAL DATA:  SRS follow-up, assess treatment response, lung cancer EXAM: MRI HEAD WITHOUT AND WITH CONTRAST TECHNIQUE: Multiplanar, multiecho pulse sequences of the brain and surrounding structures were obtained without and with intravenous contrast. CONTRAST:  10 mL Vueway COMPARISON:  03/15/2022 FINDINGS: Brain: Previously noted lobulated in enhancing left middle frontal gyrus mass measures 13 x 13 x 14 mm (AP x TR x CC) (series 18, image 134 and series 20, image 18), previously up to 20 x 20 x 26 mm when remeasured similarly. Decreased surrounding  vasogenic edema (series 14, image 45). Previously noted right perirolandic nodule now measures 2 x 3 x 2 mm (AP x TR x CC) (series 18, image 151 and series 20, image 16), previously 5 x 6 x 6 mm when remeasured similarly. No significant associated vasogenic  edema. Unchanged 5 mm enhancing lesion in the right IAC (series 18, image 58), which appears stable since November 2022. No new abnormal parenchymal or meningeal enhancement. No restricted diffusion to suggest acute or subacute infarct. No acute hemorrhage, mass effect, or midline shift. No hydrocephalus or extra-axial collection. The previously noted enhancing lesions are associated with hemosiderin deposition. No other hemosiderin deposition to suggest remote hemorrhage. Scattered T2 hyperintense signal in the periventricular white matter, likely the sequela of minimal chronic small vessel ischemic disease. Vascular: Normal arterial flow voids. Normal arterial and venous enhancement. Skull and upper cervical spine: Normal marrow signal. Sinuses/Orbits: Minimal mucosal thickening in the ethmoid air cells. No acute finding in the orbits. Other: Small amount of fluid in the left mastoid air cells. IMPRESSION: 1. Interval decrease in size of the left middle frontal gyrus mass, with decreased surrounding vasogenic edema. 2. Interval decrease in size of the right perirolandic nodule, without significant associated vasogenic edema. 3. Unchanged small right vestibular schwannoma. 4. No new metastatic disease or acute intracranial process identified. Electronically Signed   By: Merilyn Baba M.D.   On: 06/10/2022 13:56   DG Chest Port 1 View  Result Date: 05/26/2022 CLINICAL DATA:  Shortness of breath.  Lung cancer EXAM: PORTABLE CHEST 1 VIEW COMPARISON:  02/19/2022, 05/13/2022 FINDINGS: Right-sided chest port terminates at the level of the distal SVC. Heart size within normal limits. Aortic atherosclerosis. Medial left lower lobe lung mass and small bilateral  pulmonary nodules, better seen by recent CT. No new focal airspace consolidation. No pleural effusion or pneumothorax. IMPRESSION: 1. No active disease. 2. Medial left lower lobe lung mass and small bilateral pulmonary nodules, better seen by recent CT. Electronically Signed   By: Davina Poke D.O.   On: 05/26/2022 12:57   CT Chest W Contrast  Result Date: 05/14/2022 CLINICAL DATA:  Staging non-small-cell lung cancer. Ongoing chemotherapy. Finished radiation November 2023. * Tracking Code: BO * EXAM: CT CHEST, ABDOMEN, AND PELVIS WITH CONTRAST TECHNIQUE: Multidetector CT imaging of the chest, abdomen and pelvis was performed following the standard protocol during bolus administration of intravenous contrast. RADIATION DOSE REDUCTION: This exam was performed according to the departmental dose-optimization program which includes automated exposure control, adjustment of the mA and/or kV according to patient size and/or use of iterative reconstruction technique. CONTRAST:  157mL OMNIPAQUE IOHEXOL 300 MG/ML  SOLN COMPARISON:  CT 02/11/2022.  PET-CT 03/06/2022 FINDINGS: CT CHEST FINDINGS Cardiovascular: Right upper chest port. The port is accessed. Heart is nonenlarged. No pericardial effusion. Coronary artery calcifications are seen. Please correlate for other coronary risk factors. Thoracic aorta has a normal course and caliber with scattered calcified plaque. Prominent calcified plaque also seen along the left subclavian artery origin. Mediastinum/Nodes: No specific abnormal lymph node enlargement identified in the axillary region, hilum or mediastinum. Few small hilar and mediastinal nodes are seen, nonpathologic by size criteria at under 1 cm size in short axis. These are similar to the prior CT scan contrast. Lungs/Pleura: Lungs are without consolidation, pneumothorax or effusion. There is some scattered areas of scarring atelectatic change. Multiple lung nodules are once again identified there are of 6  right-sided lung nodules. Specific lesions will be followed for continuity. This includes the dominant right upper lobe nodule laterally which on the prior CT scan measured 9 mm and today on series 4, image 52 measures 9 by 8 mm. In the left lung there is a confluent masslike area in the medial left lower lobe which previously on PET-CT and  measured 5.2 x 4.4 cm and standard CT 5.4 x 4.1 cm., today on series 4, image 106 measures 5.1 by 4.5 cm. Small nodule also seen such as superior segment left lower lobe on image 80 and image 69 which are stable. Tiny left upper lobe nodule on image 31 is also stable. No new lung nodules. Musculoskeletal: Scattered degenerative changes noted along the spine. Bridging osteophytes and syndesmophytes. CT ABDOMEN PELVIS FINDINGS Hepatobiliary: Fatty liver infiltration. There is a small low-attenuation lesion identified in segment 4 near the margin of the portal vein measuring 11 mm on series 2, image 61 which was not clearly seen previously. Possibilities include a new nodule versus more focal fat deposition. Patent portal vein. Gallbladder is dilated. There is a large stone in the neck of the gallbladder measuring 4.1 cm. There is also presumed sludge in the gallbladder which was seen previously. The appearance of the gallbladder is unchanged from prior. Pancreas: Unremarkable. No pancreatic ductal dilatation or surrounding inflammatory changes. Spleen: Spleen measures 13.6 cm in longitudinal length. Preserved enhancement. Adrenals/Urinary Tract: Adrenal glands are preserved. No enhancing renal mass or collecting system dilatation. Ureters have normal course and caliber extending down to the bladder. Preserved contours of the urinary bladder. There are low-lying pelvic floor structures. Please correlate with pelvic prolapse. Stomach/Bowel: Moderate scattered colonic stool. Few scattered left-sided colonic diverticula. No obstruction, free air. Moderate debris in the stomach. Small  bowel is nondilated. Vascular/Lymphatic: Normal caliber aorta and IVC with scattered vascular calcifications. No specific abnormal lymph node enlargement identified in the abdomen and pelvis. A few small nodes are seen, not pathologic by size criteria. Reproductive: Status post hysterectomy. No adnexal masses. Other: Anasarca.  Diffuse muscle atrophy.  No ascites. Musculoskeletal: Scattered degenerative changes of the spine and pelvis. There is a sclerotic lesion along the lateral margin of the left femoral head, unchanged from previous. IMPRESSION: CT CHEST: 1. Stable bilateral pulmonary nodules including the large mass in the left lower lobe. 2. No new mass lesion, fluid collection or lymph node enlargement in the thorax. CT ABDOMEN AND PELVIS: 1. 11 mm low-attenuation lesion in segment 4 of the liver was not clearly seen previously. Possibilities include a new nodule versus more focal fat deposition. Underlying diffuse fatty liver infiltration and mild splenic enlargement. Etiology could be confirmed with dynamic liver MRI as clinically appropriate 2. Stable dilated gallbladder with sludge and a large 4 cm stone. The stone is towards the neck. 3. Low-lying pelvic floor structures, please correlate for pelvic prolapse Aortic Atherosclerosis (ICD10-I70.0). Electronically Signed   By: Jill Side M.D.   On: 05/14/2022 14:16   CT Abdomen Pelvis W Contrast  Result Date: 05/14/2022 CLINICAL DATA:  Staging non-small-cell lung cancer. Ongoing chemotherapy. Finished radiation November 2023. * Tracking Code: BO * EXAM: CT CHEST, ABDOMEN, AND PELVIS WITH CONTRAST TECHNIQUE: Multidetector CT imaging of the chest, abdomen and pelvis was performed following the standard protocol during bolus administration of intravenous contrast. RADIATION DOSE REDUCTION: This exam was performed according to the departmental dose-optimization program which includes automated exposure control, adjustment of the mA and/or kV according to  patient size and/or use of iterative reconstruction technique. CONTRAST:  181mL OMNIPAQUE IOHEXOL 300 MG/ML  SOLN COMPARISON:  CT 02/11/2022.  PET-CT 03/06/2022 FINDINGS: CT CHEST FINDINGS Cardiovascular: Right upper chest port. The port is accessed. Heart is nonenlarged. No pericardial effusion. Coronary artery calcifications are seen. Please correlate for other coronary risk factors. Thoracic aorta has a normal course and caliber with scattered calcified plaque.  Prominent calcified plaque also seen along the left subclavian artery origin. Mediastinum/Nodes: No specific abnormal lymph node enlargement identified in the axillary region, hilum or mediastinum. Few small hilar and mediastinal nodes are seen, nonpathologic by size criteria at under 1 cm size in short axis. These are similar to the prior CT scan contrast. Lungs/Pleura: Lungs are without consolidation, pneumothorax or effusion. There is some scattered areas of scarring atelectatic change. Multiple lung nodules are once again identified there are of 6 right-sided lung nodules. Specific lesions will be followed for continuity. This includes the dominant right upper lobe nodule laterally which on the prior CT scan measured 9 mm and today on series 4, image 52 measures 9 by 8 mm. In the left lung there is a confluent masslike area in the medial left lower lobe which previously on PET-CT and measured 5.2 x 4.4 cm and standard CT 5.4 x 4.1 cm., today on series 4, image 106 measures 5.1 by 4.5 cm. Small nodule also seen such as superior segment left lower lobe on image 80 and image 69 which are stable. Tiny left upper lobe nodule on image 31 is also stable. No new lung nodules. Musculoskeletal: Scattered degenerative changes noted along the spine. Bridging osteophytes and syndesmophytes. CT ABDOMEN PELVIS FINDINGS Hepatobiliary: Fatty liver infiltration. There is a small low-attenuation lesion identified in segment 4 near the margin of the portal vein measuring  11 mm on series 2, image 61 which was not clearly seen previously. Possibilities include a new nodule versus more focal fat deposition. Patent portal vein. Gallbladder is dilated. There is a large stone in the neck of the gallbladder measuring 4.1 cm. There is also presumed sludge in the gallbladder which was seen previously. The appearance of the gallbladder is unchanged from prior. Pancreas: Unremarkable. No pancreatic ductal dilatation or surrounding inflammatory changes. Spleen: Spleen measures 13.6 cm in longitudinal length. Preserved enhancement. Adrenals/Urinary Tract: Adrenal glands are preserved. No enhancing renal mass or collecting system dilatation. Ureters have normal course and caliber extending down to the bladder. Preserved contours of the urinary bladder. There are low-lying pelvic floor structures. Please correlate with pelvic prolapse. Stomach/Bowel: Moderate scattered colonic stool. Few scattered left-sided colonic diverticula. No obstruction, free air. Moderate debris in the stomach. Small bowel is nondilated. Vascular/Lymphatic: Normal caliber aorta and IVC with scattered vascular calcifications. No specific abnormal lymph node enlargement identified in the abdomen and pelvis. A few small nodes are seen, not pathologic by size criteria. Reproductive: Status post hysterectomy. No adnexal masses. Other: Anasarca.  Diffuse muscle atrophy.  No ascites. Musculoskeletal: Scattered degenerative changes of the spine and pelvis. There is a sclerotic lesion along the lateral margin of the left femoral head, unchanged from previous. IMPRESSION: CT CHEST: 1. Stable bilateral pulmonary nodules including the large mass in the left lower lobe. 2. No new mass lesion, fluid collection or lymph node enlargement in the thorax. CT ABDOMEN AND PELVIS: 1. 11 mm low-attenuation lesion in segment 4 of the liver was not clearly seen previously. Possibilities include a new nodule versus more focal fat deposition.  Underlying diffuse fatty liver infiltration and mild splenic enlargement. Etiology could be confirmed with dynamic liver MRI as clinically appropriate 2. Stable dilated gallbladder with sludge and a large 4 cm stone. The stone is towards the neck. 3. Low-lying pelvic floor structures, please correlate for pelvic prolapse Aortic Atherosclerosis (ICD10-I70.0). Electronically Signed   By: Jill Side M.D.   On: 05/14/2022 14:16     Assessment/Plan Malignant neoplasm of  lung metastatic to brain [facility])  KIONI FRANKLYN is clinically and radiographically stable today.    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  Decadron should decreased by 1mg  daily each week until stopped.  Keppra will con't 500mg  BID for now.    We appreciate the opportunity to participate in the care of Carolyn Hooper.   We ask that Carolyn Hooper return to clinic in 3 months following next brain MRI, or sooner as needed.  All questions were answered. The patient knows to call the clinic with any problems, questions or concerns. No barriers to learning were detected.  The total time spent in the encounter was 40 minutes and more than 50% was on counseling and review of test results   Ventura Sellers, MD Medical Director of Neuro-Oncology [facility] at [place] 06/10/22 10:41 AM

## 2022-06-11 ENCOUNTER — Inpatient Hospital Stay: Payer: BC Managed Care – PPO

## 2022-06-12 ENCOUNTER — Ambulatory Visit: Payer: BC Managed Care – PPO | Admitting: Occupational Therapy

## 2022-06-12 ENCOUNTER — Ambulatory Visit: Payer: BC Managed Care – PPO | Admitting: Speech Pathology

## 2022-06-13 ENCOUNTER — Telehealth: Payer: Self-pay | Admitting: Medical Oncology

## 2022-06-13 NOTE — Telephone Encounter (Signed)
What vaccines should she get ?  Does she need additional supplements to help in her recovery?

## 2022-06-17 ENCOUNTER — Telehealth: Payer: Self-pay

## 2022-06-17 NOTE — Telephone Encounter (Signed)
This nurse attempted to reach patient related to questions that patient has about vaccines.  Unable to reach patient and unable to leave a message. Will also send patient a My Chart message.  No further concerns at this time.

## 2022-06-18 ENCOUNTER — Other Ambulatory Visit: Payer: Self-pay

## 2022-06-18 ENCOUNTER — Other Ambulatory Visit: Payer: BC Managed Care – PPO

## 2022-06-19 ENCOUNTER — Encounter: Payer: Self-pay | Admitting: Internal Medicine

## 2022-06-19 ENCOUNTER — Telehealth: Payer: Self-pay

## 2022-06-19 ENCOUNTER — Ambulatory Visit: Payer: BC Managed Care – PPO | Admitting: Occupational Therapy

## 2022-06-19 ENCOUNTER — Ambulatory Visit: Payer: BC Managed Care – PPO | Admitting: Speech Pathology

## 2022-06-19 NOTE — Telephone Encounter (Signed)
This nurse reached out to patient advised that provider has opening tomorrow for evaluation, offered 1030 appointment time.  Patient is in agreement with date and time.  No further questions or concerns noted at this time.

## 2022-06-19 NOTE — Telephone Encounter (Signed)
This nurse received a call from this patient who states that on yesterday she was sitting for a while with her feet down and her fet and ankles were swollen.  She said this issue is common for her and she will normally go to bed and when she wakes up after a night of multiple trips to bathroom to urinate the swelling will have gone down but this morning when she got up her feet were still swollen and tender to the touch.  Patient denies redness and not warm to touch.  This nurse advised that this information will be forwarded to provider for recommendation.  No further questions or concerns voiced at this time.

## 2022-06-19 NOTE — Progress Notes (Unsigned)
[name]  [name], [place] [address]  DIAGNOSIS: Stage IV (T3, N0, M1b) non-small cell lung cancer, adenosquamous carcinoma presented with large medial left lower lobe lung mass with bilateral pulmonary nodules and metastatic disease to the brain diagnosed in November 2023   PRIOR THERAPY: Status post SRS to brain metastasis completed March 26, 2022   CURRENT THERAPY: Systemic chemotherapy with carboplatin for AUC of 5, Alimta 500 Mg/M2 and Keytruda 200 Mg IV every 3 weeks status post 5 cycles. First dose was given on March 06, 2022.  Starting from cycle #5, the patient started maintenance Alimta and Keytruda IV every 3 weeks.   INTERVAL HISTORY: Carolyn Hooper 62 y.o. female returns to the clinic today for a follow-up visit accompanied by her family member. The patient is here for acute visit today.  The patient struggles with frequent lower extremity leg swelling  at baseline which typically improves with elevation of her legs overnight. However, over the last 2 days, her swelling in her ankles has increased. This is equal and symmetric bilaterally with associated pitting. She also mentions weight gain.  She also has been more active and on her feet more. Her ankles are slightly tender without any erythema or warmth.  She used to use compression stockings in the past but not recently.  She denies any recent traumas or injuries.  Denies any history of blood clots.  Denies any associated calf pain.  She denies any changes in her dietary intake of salty foods.  She is on Decadron for her history of metastatic disease to the brain, however her Decadron is currently being tapered and she is on 2 mg presently. Denies any fever, chills, or night sweats. Of note, she has diabetes and has been drinking non-diabetic protein shakes. Her BS is elevated today. She has not been monitoring it as closely at home due to having a  hard time to sticking herself.   MEDICAL HISTORY: Past Medical History:  Diagnosis Date   Abscess    hydroadenitis superativa   Anxiety    DM (diabetes mellitus) ([place])    Hyperinsulinemia    Hypertension 1983   gestational - pre-eclampsia   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[facility])    Neuroma    audiotry neuroma   Seizure ([place])    Vertigo     ALLERGIES:  is allergic to adhesive [tape] and statins.  MEDICATIONS:  Current Outpatient Medications  Medication Sig Dispense Refill   furosemide (LASIX) 20 MG tablet Take 1 tablet (20 mg total) by mouth daily as needed. 10 tablet 0   albuterol (VENTOLIN HFA) 108 (90 Base) MCG/ACT inhaler Inhale 2 puffs into the lungs every 6 (six) hours as needed. (Patient not taking: Reported on 06/10/2022) 18 g 2   ALPRAZolam (XANAX) 0.5 MG tablet Take 1 tablet (0.5 mg total) by mouth 2 (two) times daily as needed for anxiety. (Patient not taking: Reported on 06/10/2022) 30 tablet 5   cephALEXin (KEFLEX) 500 MG capsule Take 1 capsule (500 mg total) by mouth 2 (two) times daily. 14 capsule 0   clonazePAM (KLONOPIN) 0.5 MG tablet Take 0.5 tablets (0.25 mg total) by mouth at bedtime. For anxiety (Patient not taking: Reported on 06/10/2022) 30 tablet 2   dexamethasone (DECADRON) 1 MG tablet Take 3 tablets (3 mg total) by mouth daily with breakfast. 90 tablet 1   fluconazole (DIFLUCAN) 150 MG tablet Take 1 tablet (150 mg total)  by mouth every three (3) days as needed. 3 tablet 0   folic acid (FOLVITE) 1 MG tablet Take 1 tablet (1 mg total) by mouth daily. 30 tablet 1   HYDROcodone-acetaminophen (NORCO) 5-325 MG tablet Take 1 - 2 tablets by mouth every 6 (six) hours as needed for moderate pain. (Patient not taking: Reported on 06/10/2022) 30 tablet 0   levETIRAcetam (KEPPRA) 1000 MG tablet Take 1 tablet (1,000 mg total) by mouth 2 (two) times daily. 60 tablet 1   lidocaine (XYLOCAINE) 5 % ointment Apply topically as needed. (Patient not taking: Reported on 06/10/2022) 60  g 1   lidocaine-prilocaine (EMLA) cream Apply to port prior to access (Patient not taking: Reported on 06/10/2022) 30 g 0   magic mouthwash (nystatin, diphenhydrAMINE, alum & mag hydroxide) suspension mixture Take 5 mLs by mouth 3 (three) times daily as needed for mouth pain. (Patient not taking: Reported on 06/10/2022) 240 mL 1   metFORMIN (GLUCOPHAGE) 1000 MG tablet Take 1 tablet (1,000 mg total) by mouth 2 (two) times daily. 180 tablet 1   ondansetron (ZOFRAN) 4 MG tablet Take 1 tablet (4 mg total) by mouth every 6 (six) hours as needed for nausea. (Patient not taking: Reported on 06/10/2022) 30 tablet 2   ondansetron (ZOFRAN) 8 MG tablet Take 1 tablet (8 mg total) by mouth 3 (three) times daily. (Patient not taking: Reported on 06/10/2022) 20 tablet 1   prochlorperazine (COMPAZINE) 10 MG tablet Take 1 tablet (10 mg total) by mouth every 6 (six) hours as needed. (Patient not taking: Reported on 06/10/2022) 30 tablet 2   No current facility-administered medications for this visit.    SURGICAL HISTORY:  Past Surgical History:  Procedure Laterality Date   ABDOMINAL HYSTERECTOMY  2006   remaining left ovary   BRONCHIAL BIOPSY  02/19/2022   Procedure: BRONCHIAL BIOPSIES;  Surgeon: Garner Nash, DO;  Location: [facility];  Service: Pulmonary;;   BRONCHIAL BRUSHINGS  02/19/2022   Procedure: BRONCHIAL BRUSHINGS;  Surgeon: Garner Nash, DO;  Location: [facility];  Service: Pulmonary;;   BRONCHIAL NEEDLE ASPIRATION BIOPSY  02/19/2022   Procedure: BRONCHIAL NEEDLE ASPIRATION BIOPSIES;  Surgeon: Garner Nash, DO;  Location: [facility];  Service: Pulmonary;;   IR IMAGING GUIDED PORT INSERTION  03/29/2022   SHOULDER SURGERY Right 1960s    REVIEW OF SYSTEMS:   Review of Systems  Constitutional: Negative for appetite change, chills, fatigue, fever and unexpected weight change.  HENT:   Negative for mouth sores, nosebleeds, sore throat and trouble swallowing.   Eyes: Negative for eye  problems and icterus.  Respiratory: Positive for baseline dyspnea. Negative for cough, hemoptysis, and wheezing.   Cardiovascular: Negative for chest pain. Bilateral lower extremity pitting edema.  Gastrointestinal: Negative for abdominal pain, constipation, diarrhea, nausea and vomiting.  Genitourinary: Negative for bladder incontinence, difficulty urinating, dysuria, frequency and hematuria.   Musculoskeletal: Negative for back pain, gait problem, neck pain and neck stiffness.  Skin: Negative for itching and rash.  Neurological: Negative for dizziness, extremity weakness, gait problem, headaches, light-headedness and seizures.  Hematological: Negative for adenopathy. Does not bruise/bleed easily.  Psychiatric/Behavioral: Positive for speech disturbance. Negative for confusion, depression and sleep disturbance. The patient is not nervous/anxious.    PHYSICAL EXAMINATION:  Blood pressure 132/67, pulse 82, temperature 97.9 F (36.6 C), temperature source Temporal, resp. rate 17, height 5\' 8"  (1.727 m), weight 253 lb 3.2 oz (114.9 kg), SpO2 99 %.  ECOG PERFORMANCE STATUS: 1-2  Physical Exam  Constitutional:  Oriented to person, place, and time and well-developed, well-nourished, and in no distress.  HENT:  Head: Normocephalic and atraumatic.  Mouth/Throat: Oropharynx is clear and moist. No oropharyngeal exudate.  Eyes: Conjunctivae are normal. Right eye exhibits no discharge. Left eye exhibits no discharge. No scleral icterus.  Neck: Normal range of motion. Neck supple.  Cardiovascular: Normal rate, regular rhythm, normal heart sounds and intact distal pulses.   Pulmonary/Chest: Effort normal and breath sounds normal. No respiratory distress. No wheezes. No rales.  Abdominal: Soft. Bowel sounds are normal. Exhibits no distension and no mass. There is no tenderness.  Musculoskeletal: Normal range of motion. Positive for bilateral lower extremity pitting edema.  Lymphadenopathy:    No  cervical adenopathy.  Neurological: Alert and oriented to person, place, and time. Exhibits normal muscle tone. Gait normal. Coordination normal.  Skin: Skin is warm and dry. No rash noted. Not diaphoretic. No erythema. No pallor.  Psychiatric: Positive for slow speech and trouble with word finding.  Mood, memory and judgment normal.  Vitals reviewed.  LABORATORY DATA: Lab Results  Component Value Date   WBC 8.9 06/20/2022   HGB 10.6 (L) 06/20/2022   HCT 31.2 (L) 06/20/2022   MCV 106.1 (H) 06/20/2022   PLT 156 06/20/2022      Chemistry      Component Value Date/Time   NA 138 06/20/2022 1008   NA 137 05/30/2022 1447   K 3.9 06/20/2022 1008   CL 101 06/20/2022 1008   CO2 28 06/20/2022 1008   BUN 14 06/20/2022 1008   BUN 15 05/30/2022 1447   CREATININE 0.65 06/20/2022 1008      Component Value Date/Time   CALCIUM 9.4 06/20/2022 1008   ALKPHOS 55 06/20/2022 1008   AST 25 06/20/2022 1008   ALT 85 (H) 06/20/2022 1008   BILITOT 0.4 06/20/2022 1008       RADIOGRAPHIC STUDIES:  MR BRAIN W WO CONTRAST  Result Date: 06/10/2022 CLINICAL DATA:  SRS follow-up, assess treatment response, lung cancer EXAM: MRI HEAD WITHOUT AND WITH CONTRAST TECHNIQUE: Multiplanar, multiecho pulse sequences of the brain and surrounding structures were obtained without and with intravenous contrast. CONTRAST:  10 mL V[employer]way COMPARISON:  03/15/2022 FINDINGS: Brain: Previously noted lobulated in enhancing left middle frontal gyrus mass measures 13 x 13 x 14 mm (AP x TR x CC) (series 18, image 134 and series 20, image 18), previously up to 20 x 20 x 26 mm when remeasured similarly. Decreased surrounding vasogenic edema (series 14, image 45). Previously noted right perirolandic nodule now measures 2 x 3 x 2 mm (AP x TR x CC) (series 18, image 151 and series 20, image 16), previously 5 x 6 x 6 mm when remeasured similarly. No significant associated vasogenic edema. Unchanged 5 mm enhancing lesion in the right IAC  (series 18, image 58), which appears stable since November 2022. No new abnormal parenchymal or meningeal enhancement. No restricted diffusion to suggest acute or subacute infarct. No acute hemorrhage, mass effect, or midline shift. No hydrocephalus or extra-axial collection. The previously noted enhancing lesions are associated with hemosiderin deposition. No other hemosiderin deposition to suggest remote hemorrhage. Scattered T2 hyperintense signal in the periventricular white matter, likely the sequela of minimal chronic small vessel ischemic disease. Vascular: Normal arterial flow voids. Normal arterial and venous enhancement. Skull and upper cervical spine: Normal marrow signal. Sinuses/Orbits: Minimal mucosal thickening in the ethmoid air cells. No acute finding in the orbits. Other: Small amount of fluid in the left mastoid  air cells. IMPRESSION: 1. Interval decrease in size of the left middle frontal gyrus mass, with decreased surrounding vasogenic edema. 2. Interval decrease in size of the right perirolandic nodule, without significant associated vasogenic edema. 3. Unchanged small right vestibular schwannoma. 4. No new metastatic disease or acute intracranial process identified. Electronically Signed   By: Merilyn Baba M.D.   On: 06/10/2022 13:56   DG Chest Port 1 View  Result Date: 05/26/2022 CLINICAL DATA:  Shortness of breath.  Lung cancer EXAM: PORTABLE CHEST 1 VIEW COMPARISON:  02/19/2022, 05/13/2022 FINDINGS: Right-sided chest port terminates at the level of the distal SVC. Heart size within normal limits. Aortic atherosclerosis. Medial left lower lobe lung mass and small bilateral pulmonary nodules, better seen by recent CT. No new focal airspace consolidation. No pleural effusion or pneumothorax. IMPRESSION: 1. No active disease. 2. Medial left lower lobe lung mass and small bilateral pulmonary nodules, better seen by recent CT. Electronically Signed   By: Davina Poke D.O.   On:  05/26/2022 12:57     ASSESSMENT/PLAN:  This is a very pleasant 62 year old Caucasian female diagnosed with stage IV (T3, N0, M1 B) non-small cell lung cancer, adenosquamous carcinoma.  She presented with a large medial left lower lobe lung mass with bilateral pulmonary nodules metastatic disease to the brain.  She was diagnosed in November 2023.   She completed SRS to the metastatic brain lesions on 03/26/2022.   She is currently on palliative systemic chemotherapy with carboplatin for an AUC of 5, Alimta 500 mg/m, and Keytruda 20 mg IV every 3 weeks.  Starting from cycle #5, she started maintenance treatment with Keytruda and Alimta.  She is status post 5 cycles of treatment total.  The patient is here today for increased lower extremity swelling.  She has some associated pitting edema.  Considering her edema is chronic problem that has worsened recently and symmetric bilaterally, I do not feel strongly that she requires Doppler ultrasound to rule out DVT.  Denies any calf pain or erythema.  She also reports associated weight gain.  The patient would like to manage this conservatively if possible.  Therefore, we discussed starting to use compression stockings, avoiding salty foods, and elevating her lower extremities during the daytime when possible.  I did send her prescription of Lasix to take 20 mill equivalents p.o. daily as needed for swelling.  The patient is going to try conservative measures first but we will reserve the Lasix if absolutely needed.  The patient understands that she needs to check her blood pressure prior to taking Lasix and to avoid taking Lasix that day if she has hypotension.  If she does take Lasix, she was advised to increase her potassium intake that day.  The patient's blood sugar is elevated today.  She reports she drank a protein shake this morning.  She is not drinking the diabetic protein drinks.  Instructed her to take diabetic protein drinks/low sugar/carbs.  He was  also instructed to monitor her blood sugar closer at home.  She mentions that she was wanting to be evaluated for sleep apnea and that her PCP required her to come into the office prior to having a sleep study performed. Due to financial concerns, she is wanting to avoid additional office visits. I discussed with the patient this may be due to insurance and submitting clinical documentation showing that a sleep study is necessary.  I recommended that she see her PCP as they recommended.   Will see her back  for follow-up visit as scheduled next week for reevaluation before starting cycle #6.   We discussed signs and symptoms that would warrant repeat evaluation such as erythema of the lower extremity, increased swelling, increased pain, etc.  The patient was advised to call immediately if she has any concerning symptoms in the interval. The patient voices understanding of current disease status and treatment options and is in agreement with the current care plan. All questions were answered. The patient knows to call the clinic with any problems, questions or concerns. We can certainly see the patient much sooner if necessary          No orders of the defined types were placed in this encounter.    The total time spent in the appointment was 20-29 minutes.   Kethan Papadopoulos L Jamal Haskin, PA-C 06/20/22

## 2022-06-20 ENCOUNTER — Other Ambulatory Visit (HOSPITAL_COMMUNITY): Payer: Self-pay

## 2022-06-20 ENCOUNTER — Inpatient Hospital Stay: Payer: BC Managed Care – PPO

## 2022-06-20 ENCOUNTER — Other Ambulatory Visit: Payer: Self-pay

## 2022-06-20 ENCOUNTER — Inpatient Hospital Stay: Payer: BC Managed Care – PPO | Attending: Physician Assistant | Admitting: Physician Assistant

## 2022-06-20 VITALS — BP 132/67 | HR 82 | Temp 97.9°F | Resp 17 | Ht 68.0 in | Wt 253.2 lb

## 2022-06-20 DIAGNOSIS — C3492 Malignant neoplasm of unspecified part of left bronchus or lung: Secondary | ICD-10-CM

## 2022-06-20 DIAGNOSIS — C3432 Malignant neoplasm of lower lobe, left bronchus or lung: Secondary | ICD-10-CM | POA: Diagnosis not present

## 2022-06-20 DIAGNOSIS — Z95828 Presence of other vascular implants and grafts: Secondary | ICD-10-CM

## 2022-06-20 DIAGNOSIS — Z5111 Encounter for antineoplastic chemotherapy: Secondary | ICD-10-CM | POA: Insufficient documentation

## 2022-06-20 DIAGNOSIS — Z7984 Long term (current) use of oral hypoglycemic drugs: Secondary | ICD-10-CM | POA: Diagnosis not present

## 2022-06-20 DIAGNOSIS — G4730 Sleep apnea, unspecified: Secondary | ICD-10-CM | POA: Insufficient documentation

## 2022-06-20 DIAGNOSIS — Z7952 Long term (current) use of systemic steroids: Secondary | ICD-10-CM | POA: Diagnosis not present

## 2022-06-20 DIAGNOSIS — C7931 Secondary malignant neoplasm of brain: Secondary | ICD-10-CM | POA: Insufficient documentation

## 2022-06-20 DIAGNOSIS — Z79899 Other long term (current) drug therapy: Secondary | ICD-10-CM | POA: Insufficient documentation

## 2022-06-20 DIAGNOSIS — Z5112 Encounter for antineoplastic immunotherapy: Secondary | ICD-10-CM | POA: Insufficient documentation

## 2022-06-20 DIAGNOSIS — C3490 Malignant neoplasm of unspecified part of unspecified bronchus or lung: Secondary | ICD-10-CM

## 2022-06-20 DIAGNOSIS — R609 Edema, unspecified: Secondary | ICD-10-CM | POA: Diagnosis not present

## 2022-06-20 DIAGNOSIS — E119 Type 2 diabetes mellitus without complications: Secondary | ICD-10-CM | POA: Diagnosis not present

## 2022-06-20 LAB — CMP (CANCER CENTER ONLY)
ALT: 85 U/L — ABNORMAL HIGH (ref 0–44)
AST: 25 U/L (ref 15–41)
Albumin: 3.6 g/dL (ref 3.5–5.0)
Alkaline Phosphatase: 55 U/L (ref 38–126)
Anion gap: 9 (ref 5–15)
BUN: 14 mg/dL (ref 8–23)
CO2: 28 mmol/L (ref 22–32)
Calcium: 9.4 mg/dL (ref 8.9–10.3)
Chloride: 101 mmol/L (ref 98–111)
Creatinine: 0.65 mg/dL (ref 0.44–1.00)
GFR, Estimated: >60 mL/min (ref >60)
Glucose, Bld: 333 mg/dL — ABNORMAL HIGH (ref 70–99)
Potassium: 3.9 mmol/L (ref 3.5–5.1)
Sodium: 138 mmol/L (ref 135–145)
Total Bilirubin: 0.4 mg/dL (ref 0.3–1.2)
Total Protein: 6.2 g/dL — ABNORMAL LOW (ref 6.5–8.1)

## 2022-06-20 LAB — CBC WITH DIFFERENTIAL (CANCER CENTER ONLY)
Abs Immature Granulocytes: 0.05 10*3/uL (ref 0.00–0.07)
Basophils Absolute: 0 10*3/uL (ref 0.0–0.1)
Basophils Relative: 1 %
Eosinophils Absolute: 0.7 10*3/uL — ABNORMAL HIGH (ref 0.0–0.5)
Eosinophils Relative: 8 %
HCT: 31.2 % — ABNORMAL LOW (ref 36.0–46.0)
Hemoglobin: 10.6 g/dL — ABNORMAL LOW (ref 12.0–15.0)
Immature Granulocytes: 1 %
Lymphocytes Relative: 28 %
Lymphs Abs: 2.5 10*3/uL (ref 0.7–4.0)
MCH: 36.1 pg — ABNORMAL HIGH (ref 26.0–34.0)
MCHC: 34 g/dL (ref 30.0–36.0)
MCV: 106.1 fL — ABNORMAL HIGH (ref 80.0–100.0)
Monocytes Absolute: 0.7 10*3/uL (ref 0.1–1.0)
Monocytes Relative: 8 %
Neutro Abs: 4.8 10*3/uL (ref 1.7–7.7)
Neutrophils Relative %: 54 %
Platelet Count: 156 10*3/uL (ref 150–400)
RBC: 2.94 MIL/uL — ABNORMAL LOW (ref 3.87–5.11)
RDW: 19.8 % — ABNORMAL HIGH (ref 11.5–15.5)
WBC Count: 8.9 10*3/uL (ref 4.0–10.5)
nRBC: 0.5 % — ABNORMAL HIGH (ref 0.0–0.2)

## 2022-06-20 MED ORDER — HEPARIN SOD (PORK) LOCK FLUSH 100 UNIT/ML IV SOLN
500.0000 [IU] | Freq: Once | INTRAVENOUS | Status: AC
  Administered 2022-06-20: 500 [IU]

## 2022-06-20 MED ORDER — SODIUM CHLORIDE 0.9% FLUSH
10.0000 mL | Freq: Once | INTRAVENOUS | Status: AC
  Administered 2022-06-20: 10 mL

## 2022-06-20 MED ORDER — FUROSEMIDE 20 MG PO TABS
20.0000 mg | ORAL_TABLET | Freq: Every day | ORAL | 0 refills | Status: DC | PRN
  Filled 2022-06-20: qty 10, 10d supply, fill #0

## 2022-06-24 ENCOUNTER — Other Ambulatory Visit: Payer: Self-pay

## 2022-06-24 ENCOUNTER — Encounter: Payer: Self-pay | Admitting: Internal Medicine

## 2022-06-24 ENCOUNTER — Telehealth: Payer: Self-pay

## 2022-06-24 ENCOUNTER — Other Ambulatory Visit (HOSPITAL_COMMUNITY): Payer: Self-pay

## 2022-06-24 ENCOUNTER — Other Ambulatory Visit: Payer: Self-pay | Admitting: Family

## 2022-06-24 MED ORDER — FLUCONAZOLE 150 MG PO TABS
150.0000 mg | ORAL_TABLET | ORAL | 0 refills | Status: DC | PRN
  Filled 2022-06-24: qty 3, 9d supply, fill #0

## 2022-06-24 NOTE — Telephone Encounter (Signed)
TC to pt, wanting to get the 3 pill regimen of Diflucan as preventative before Chemo on Wednesday, last time she got a yeast & bladder infection. She is already having some tenderness but urine is clear having some external yeast, has had thrush w/ chemo.

## 2022-06-24 NOTE — Telephone Encounter (Signed)
  Prescription Request  06/24/2022  Is this a "Controlled Substance" medicine? no  Have you seen your PCP in the last 2 weeks? no  If YES, route message to pool  -  If NO, patient needs to be scheduled for appointment.  What is the name of the medication or equipment? Fluconazole 150 mg for yeast in fection  Have you contacted your pharmacy to request a refill? no   Which pharmacy would you like this sent to? [facility]   Patient notified that their request is being sent to the clinical staff for review and that they should receive a response within 2 business days.

## 2022-06-24 NOTE — Telephone Encounter (Signed)
This patient called states that she was treated with Diflucan for a yeast infection following her last treatment and she feels that the yeast infection has not cleared up completely.  Patient has treatment scheduled for Wednesday and is concerned that symptoms will get worse after her treatment.  She has reached out to PCP and she is out of town and so prescription has not been refilled.  Patient would like to know if this provider will send in a one time refill on the Diflucan.  This nurse advised that request will be forwarded to provider.  No further questions or concerns noted at this time.

## 2022-06-24 NOTE — Telephone Encounter (Signed)
Pt aware refill sent to pharmacy, wants to let PCP know she will let her know how chemo goes this time around.

## 2022-06-25 ENCOUNTER — Encounter: Payer: Self-pay | Admitting: Internal Medicine

## 2022-06-25 ENCOUNTER — Other Ambulatory Visit (HOSPITAL_COMMUNITY): Payer: Self-pay

## 2022-06-25 ENCOUNTER — Telehealth: Payer: Self-pay | Admitting: Medical Oncology

## 2022-06-25 NOTE — Telephone Encounter (Signed)
Recurring yeast infection."What meds am I taking that creates another yeast infection?' I left VM to return my call to go over her meds.

## 2022-06-26 ENCOUNTER — Encounter: Payer: Self-pay | Admitting: Internal Medicine

## 2022-06-26 ENCOUNTER — Encounter: Payer: Self-pay | Admitting: Medical Oncology

## 2022-06-26 ENCOUNTER — Telehealth: Payer: Self-pay | Admitting: Family

## 2022-06-26 ENCOUNTER — Inpatient Hospital Stay (HOSPITAL_BASED_OUTPATIENT_CLINIC_OR_DEPARTMENT_OTHER): Payer: BC Managed Care – PPO | Admitting: Internal Medicine

## 2022-06-26 ENCOUNTER — Inpatient Hospital Stay: Payer: BC Managed Care – PPO

## 2022-06-26 ENCOUNTER — Telehealth: Payer: Self-pay | Admitting: Medical Oncology

## 2022-06-26 ENCOUNTER — Other Ambulatory Visit: Payer: Self-pay

## 2022-06-26 ENCOUNTER — Other Ambulatory Visit: Payer: BC Managed Care – PPO

## 2022-06-26 VITALS — BP 147/83 | HR 80 | Temp 97.7°F | Resp 16 | Wt 245.7 lb

## 2022-06-26 DIAGNOSIS — C3490 Malignant neoplasm of unspecified part of unspecified bronchus or lung: Secondary | ICD-10-CM

## 2022-06-26 DIAGNOSIS — C3492 Malignant neoplasm of unspecified part of left bronchus or lung: Secondary | ICD-10-CM

## 2022-06-26 DIAGNOSIS — Z79899 Other long term (current) drug therapy: Secondary | ICD-10-CM | POA: Diagnosis not present

## 2022-06-26 DIAGNOSIS — Z95828 Presence of other vascular implants and grafts: Secondary | ICD-10-CM

## 2022-06-26 DIAGNOSIS — C3432 Malignant neoplasm of lower lobe, left bronchus or lung: Secondary | ICD-10-CM | POA: Diagnosis not present

## 2022-06-26 DIAGNOSIS — Z5112 Encounter for antineoplastic immunotherapy: Secondary | ICD-10-CM | POA: Diagnosis not present

## 2022-06-26 DIAGNOSIS — C7931 Secondary malignant neoplasm of brain: Secondary | ICD-10-CM | POA: Diagnosis not present

## 2022-06-26 DIAGNOSIS — Z7952 Long term (current) use of systemic steroids: Secondary | ICD-10-CM | POA: Diagnosis not present

## 2022-06-26 DIAGNOSIS — E119 Type 2 diabetes mellitus without complications: Secondary | ICD-10-CM | POA: Diagnosis not present

## 2022-06-26 DIAGNOSIS — Z5111 Encounter for antineoplastic chemotherapy: Secondary | ICD-10-CM | POA: Diagnosis not present

## 2022-06-26 DIAGNOSIS — Z7984 Long term (current) use of oral hypoglycemic drugs: Secondary | ICD-10-CM | POA: Diagnosis not present

## 2022-06-26 DIAGNOSIS — G4730 Sleep apnea, unspecified: Secondary | ICD-10-CM | POA: Diagnosis not present

## 2022-06-26 LAB — CBC WITH DIFFERENTIAL (CANCER CENTER ONLY)
Abs Immature Granulocytes: 0.02 10*3/uL (ref 0.00–0.07)
Basophils Absolute: 0.1 10*3/uL (ref 0.0–0.1)
Basophils Relative: 1 %
Eosinophils Absolute: 0.6 10*3/uL — ABNORMAL HIGH (ref 0.0–0.5)
Eosinophils Relative: 7 %
HCT: 35.3 % — ABNORMAL LOW (ref 36.0–46.0)
Hemoglobin: 11.7 g/dL — ABNORMAL LOW (ref 12.0–15.0)
Immature Granulocytes: 0 %
Lymphocytes Relative: 11 %
Lymphs Abs: 1 10*3/uL (ref 0.7–4.0)
MCH: 34.6 pg — ABNORMAL HIGH (ref 26.0–34.0)
MCHC: 33.1 g/dL (ref 30.0–36.0)
MCV: 104.4 fL — ABNORMAL HIGH (ref 80.0–100.0)
Monocytes Absolute: 0.9 10*3/uL (ref 0.1–1.0)
Monocytes Relative: 10 %
Neutro Abs: 6.2 10*3/uL (ref 1.7–7.7)
Neutrophils Relative %: 71 %
Platelet Count: 216 10*3/uL (ref 150–400)
RBC: 3.38 MIL/uL — ABNORMAL LOW (ref 3.87–5.11)
RDW: 16.8 % — ABNORMAL HIGH (ref 11.5–15.5)
WBC Count: 8.7 10*3/uL (ref 4.0–10.5)
nRBC: 0 % (ref 0.0–0.2)

## 2022-06-26 LAB — CMP (CANCER CENTER ONLY)
ALT: 76 U/L — ABNORMAL HIGH (ref 0–44)
AST: 22 U/L (ref 15–41)
Albumin: 3.7 g/dL (ref 3.5–5.0)
Alkaline Phosphatase: 63 U/L (ref 38–126)
Anion gap: 9 (ref 5–15)
BUN: 12 mg/dL (ref 8–23)
CO2: 29 mmol/L (ref 22–32)
Calcium: 9.4 mg/dL (ref 8.9–10.3)
Chloride: 97 mmol/L — ABNORMAL LOW (ref 98–111)
Creatinine: 0.75 mg/dL (ref 0.44–1.00)
GFR, Estimated: >60 mL/min (ref >60)
Glucose, Bld: 387 mg/dL — ABNORMAL HIGH (ref 70–99)
Potassium: 3.8 mmol/L (ref 3.5–5.1)
Sodium: 135 mmol/L (ref 135–145)
Total Bilirubin: 0.6 mg/dL (ref 0.3–1.2)
Total Protein: 6.6 g/dL (ref 6.5–8.1)

## 2022-06-26 LAB — TSH: TSH: 2.721 u[IU]/mL (ref 0.350–4.500)

## 2022-06-26 MED ORDER — CYANOCOBALAMIN 1000 MCG/ML IJ SOLN
1000.0000 ug | Freq: Once | INTRAMUSCULAR | Status: AC
  Administered 2022-06-26: 1000 ug via INTRAMUSCULAR
  Filled 2022-06-26: qty 1

## 2022-06-26 MED ORDER — SODIUM CHLORIDE 0.9 % IV SOLN
200.0000 mg | Freq: Once | INTRAVENOUS | Status: AC
  Administered 2022-06-26: 200 mg via INTRAVENOUS
  Filled 2022-06-26: qty 200

## 2022-06-26 MED ORDER — PROCHLORPERAZINE MALEATE 10 MG PO TABS
10.0000 mg | ORAL_TABLET | Freq: Once | ORAL | Status: AC
  Administered 2022-06-26: 10 mg via ORAL
  Filled 2022-06-26: qty 1

## 2022-06-26 MED ORDER — SODIUM CHLORIDE 0.9% FLUSH
10.0000 mL | INTRAVENOUS | Status: DC | PRN
  Administered 2022-06-26: 10 mL

## 2022-06-26 MED ORDER — SODIUM CHLORIDE 0.9% FLUSH
10.0000 mL | Freq: Once | INTRAVENOUS | Status: AC
  Administered 2022-06-26: 10 mL

## 2022-06-26 MED ORDER — SODIUM CHLORIDE 0.9 % IV SOLN
500.0000 mg/m2 | Freq: Once | INTRAVENOUS | Status: AC
  Administered 2022-06-26: 1100 mg via INTRAVENOUS
  Filled 2022-06-26: qty 40

## 2022-06-26 MED ORDER — SODIUM CHLORIDE 0.9 % IV SOLN: Freq: Once | INTRAVENOUS | Status: AC

## 2022-06-26 MED ORDER — HEPARIN SOD (PORK) LOCK FLUSH 100 UNIT/ML IV SOLN
500.0000 [IU] | Freq: Once | INTRAVENOUS | Status: AC | PRN
  Administered 2022-06-26: 500 [IU]

## 2022-06-26 NOTE — Telephone Encounter (Signed)
Faxed CMP to [facility] and left message that pt is unclear how to take her metformin.

## 2022-06-26 NOTE — Progress Notes (Signed)
Patient now taking Dex 1mg  po daily.  Anola Gurney Taylor, [place], BCPS, BCOP 06/26/2022 11:34 AM

## 2022-06-26 NOTE — Progress Notes (Signed)
[facility] Telephone:([phone]) [phone]   Fax:(336) [phone]  OFFICE PROGRESS NOTE  Carolyn SpencerHawks, [name] A, FNP [address] [place] [id number]  DIAGNOSIS: Stage IV (T3, N0, M1b) non-small cell lung cancer, adenosquamous carcinoma presented with large medial left lower lobe lung mass with bilateral pulmonary nodules and metastatic disease to the brain diagnosed in November 2023   Detected Alteration(s) / Biomarker(s) Associated FDA-approved therapies Clinical Trial Availability % cfDNA or Amplification  ATM [id number] approved in other indication Olaparib, Talazoparib Yes 0.5%  ATM R337H ND None None  . PD-L1 expression is negative.  PRIOR THERAPY: Status post SRS to brain metastasis completed March 26, 2022    CURRENT THERAPY: Systemic chemotherapy with carboplatin for AUC of 5, Alimta 500 Mg/M2 and Keytruda 200 Mg IV every 3 weeks status post 5 cycles. First dose was given on March 06, 2022.   INTERVAL HISTORY: Carolyn Hooper 62 y.o. female returns to the clinic today for follow-up visit.  The patient is feeling fine today with no concerning complaints except for the frequent yeast infection and she is followed by her primary care physician for management of this issue.  She also continues to have mild tremor and occasional dizzy spells especially with changing position.  She is followed by Dr. Barbaraann CaoVaslow for her neurological issues.  She denied having any chest pain, shortness of breath, cough or hemoptysis.  She has no nausea, vomiting, diarrhea or constipation.  She has no headache or visual changes.  She is here today for evaluation before starting cycle #6 of her treatment.  MEDICAL HISTORY: Past Medical History:  Diagnosis Date   Abscess    hydroadenitis superativa   Anxiety    DM (diabetes mellitus) (HCC)    Hyperinsulinemia    Hypertension 1983   gestational - pre-eclampsia   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[facility](HCC)    Neuroma    audiotry neuroma    Seizure (HCC)    Vertigo     ALLERGIES:  is allergic to adhesive [tape] and statins.  MEDICATIONS:  Current Outpatient Medications  Medication Sig Dispense Refill   albuterol (VENTOLIN HFA) 108 (90 Base) MCG/ACT inhaler Inhale 2 puffs into the lungs every 6 (six) hours as needed. (Patient not taking: Reported on 06/10/2022) 18 g 2   ALPRAZolam (XANAX) 0.5 MG tablet Take 1 tablet (0.5 mg total) by mouth 2 (two) times daily as needed for anxiety. (Patient not taking: Reported on 06/10/2022) 30 tablet 5   cephALEXin (KEFLEX) 500 MG capsule Take 1 capsule (500 mg total) by mouth 2 (two) times daily. 14 capsule 0   clonazePAM (KLONOPIN) 0.5 MG tablet Take 0.5 tablets (0.25 mg total) by mouth at bedtime. For anxiety (Patient not taking: Reported on 06/10/2022) 30 tablet 2   dexamethasone (DECADRON) 1 MG tablet Take 3 tablets (3 mg total) by mouth daily with breakfast. 90 tablet 1   fluconazole (DIFLUCAN) 150 MG tablet Take 1 tablet (150 mg total) by mouth every 3 days as needed. 3 tablet 0   folic acid (FOLVITE) 1 MG tablet Take 1 tablet (1 mg total) by mouth daily. 30 tablet 1   furosemide (LASIX) 20 MG tablet Take 1 tablet (20 mg total) by mouth daily as needed. 10 tablet 0   HYDROcodone-acetaminophen (NORCO) 5-325 MG tablet Take 1 - 2 tablets by mouth every 6 (six) hours as needed for moderate pain. (Patient not taking: Reported on 06/10/2022) 30 tablet 0   levETIRAcetam (KEPPRA) 1000  MG tablet Take 1 tablet (1,000 mg total) by mouth 2 (two) times daily. 60 tablet 1   lidocaine (XYLOCAINE) 5 % ointment Apply topically as needed. (Patient not taking: Reported on 06/10/2022) 60 g 1   lidocaine-prilocaine (EMLA) cream Apply to port prior to access (Patient not taking: Reported on 06/10/2022) 30 g 0   magic mouthwash (nystatin, diphenhydrAMINE, alum & mag hydroxide) suspension mixture Take 5 mLs by mouth 3 (three) times daily as needed for mouth pain. (Patient not taking: Reported on 06/10/2022) 240 mL 1    metFORMIN (GLUCOPHAGE) 1000 MG tablet Take 1 tablet (1,000 mg total) by mouth 2 (two) times daily. 180 tablet 1   ondansetron (ZOFRAN) 4 MG tablet Take 1 tablet (4 mg total) by mouth every 6 (six) hours as needed for nausea. (Patient not taking: Reported on 06/10/2022) 30 tablet 2   ondansetron (ZOFRAN) 8 MG tablet Take 1 tablet (8 mg total) by mouth 3 (three) times daily. (Patient not taking: Reported on 06/10/2022) 20 tablet 1   prochlorperazine (COMPAZINE) 10 MG tablet Take 1 tablet (10 mg total) by mouth every 6 (six) hours as needed. (Patient not taking: Reported on 06/10/2022) 30 tablet 2   No current facility-administered medications for this visit.    SURGICAL HISTORY:  Past Surgical History:  Procedure Laterality Date   ABDOMINAL HYSTERECTOMY  2006   remaining left ovary   BRONCHIAL BIOPSY  02/19/2022   Procedure: BRONCHIAL BIOPSIES;  Surgeon: Josephine Igo, DO;  Location: [facility];  Service: Pulmonary;;   BRONCHIAL BRUSHINGS  02/19/2022   Procedure: BRONCHIAL BRUSHINGS;  Surgeon: Josephine Igo, DO;  Location: [facility];  Service: Pulmonary;;   BRONCHIAL NEEDLE ASPIRATION BIOPSY  02/19/2022   Procedure: BRONCHIAL NEEDLE ASPIRATION BIOPSIES;  Surgeon: Josephine Igo, DO;  Location: [facility];  Service: Pulmonary;;   IR IMAGING GUIDED PORT INSERTION  03/29/2022   SHOULDER SURGERY Right 1960s    REVIEW OF SYSTEMS:  A comprehensive review of systems was negative except for: Constitutional: positive for fatigue Neurological: positive for dizziness and weakness   PHYSICAL EXAMINATION: General appearance: alert, cooperative, fatigued, and no distress Head: Normocephalic, without obvious abnormality, atraumatic Neck: no adenopathy, no JVD, supple, symmetrical, trachea midline, and thyroid not enlarged, symmetric, no tenderness/mass/nodules Lymph nodes: Cervical, supraclavicular, and axillary nodes normal. Resp: clear to auscultation bilaterally Back: symmetric, no  curvature. ROM normal. No CVA tenderness. Cardio: regular rate and rhythm, S1, S2 normal, no murmur, click, rub or gallop GI: soft, non-tender; bowel sounds normal; no masses,  no organomegaly Extremities: extremities normal, atraumatic, no cyanosis or edema  ECOG PERFORMANCE STATUS: 1 - Symptomatic but completely ambulatory  Blood pressure (!) 147/83, pulse 80, temperature 97.7 F (36.5 C), temperature source Oral, resp. rate 16, weight 245 lb 11.2 oz (111.4 kg), SpO2 92 %.  LABORATORY DATA: Lab Results  Component Value Date   WBC 8.7 06/26/2022   HGB 11.7 (L) 06/26/2022   HCT 35.3 (L) 06/26/2022   MCV 104.4 (H) 06/26/2022   PLT 216 06/26/2022      Chemistry      Component Value Date/Time   NA 138 06/20/2022 1008   NA 137 05/30/2022 1447   K 3.9 06/20/2022 1008   CL 101 06/20/2022 1008   CO2 28 06/20/2022 1008   BUN 14 06/20/2022 1008   BUN 15 05/30/2022 1447   CREATININE 0.65 06/20/2022 1008      Component Value Date/Time   CALCIUM 9.4 06/20/2022 1008   ALKPHOS 55  06/20/2022 1008   AST 25 06/20/2022 1008   ALT 85 (H) 06/20/2022 1008   BILITOT 0.4 06/20/2022 1008       RADIOGRAPHIC STUDIES: MR BRAIN W WO CONTRAST  Result Date: 06/10/2022 CLINICAL DATA:  SRS follow-up, assess treatment response, lung cancer EXAM: MRI HEAD WITHOUT AND WITH CONTRAST TECHNIQUE: Multiplanar, multiecho pulse sequences of the brain and surrounding structures were obtained without and with intravenous contrast. CONTRAST:  10 mL Vueway COMPARISON:  03/15/2022 FINDINGS: Brain: Previously noted lobulated in enhancing left middle frontal gyrus mass measures 13 x 13 x 14 mm (AP x TR x CC) (series 18, image 134 and series 20, image 18), previously up to 20 x 20 x 26 mm when remeasured similarly. Decreased surrounding vasogenic edema (series 14, image 45). Previously noted right perirolandic nodule now measures 2 x 3 x 2 mm (AP x TR x CC) (series 18, image 151 and series 20, image 16), previously 5 x 6  x 6 mm when remeasured similarly. No significant associated vasogenic edema. Unchanged 5 mm enhancing lesion in the right IAC (series 18, image 58), which appears stable since November 2022. No new abnormal parenchymal or meningeal enhancement. No restricted diffusion to suggest acute or subacute infarct. No acute hemorrhage, mass effect, or midline shift. No hydrocephalus or extra-axial collection. The previously noted enhancing lesions are associated with hemosiderin deposition. No other hemosiderin deposition to suggest remote hemorrhage. Scattered T2 hyperintense signal in the periventricular white matter, likely the sequela of minimal chronic small vessel ischemic disease. Vascular: Normal arterial flow voids. Normal arterial and venous enhancement. Skull and upper cervical spine: Normal marrow signal. Sinuses/Orbits: Minimal mucosal thickening in the ethmoid air cells. No acute finding in the orbits. Other: Small amount of fluid in the left mastoid air cells. IMPRESSION: 1. Interval decrease in size of the left middle frontal gyrus mass, with decreased surrounding vasogenic edema. 2. Interval decrease in size of the right perirolandic nodule, without significant associated vasogenic edema. 3. Unchanged small right vestibular schwannoma. 4. No new metastatic disease or acute intracranial process identified. Electronically Signed   By: Wiliam Ke M.D.   On: 06/10/2022 13:56    ASSESSMENT AND PLAN: This is a very pleasant 62 years old white female with Stage IV (T3, N0, M1b) non-small cell lung cancer, adenosquamous carcinoma presented with large medial left lower lobe lung mass with bilateral pulmonary nodules and metastatic disease to the brain diagnosed in November 2023 the patient has no actionable mutations and has negative PD-L1 expression. 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 She is currently undergoing treatment with systemic chemotherapy with carboplatin for AUC of 5, Alimta 500 Mg/M2 and  Keytruda 200 Mg IV every 3 weeks status post 5 cycles.   The patient has been tolerating this treatment well with no concerning adverse effects. I recommended for her to proceed with cycle #6 today as planned. I will see her back for follow-up visit in 3 weeks for evaluation before starting cycle #7 with repeat CT scan of the chest, abdomen and pelvis for restaging of her disease. For the brain metastasis, she is currently followed by Dr. Barbaraann Cao and her last MRI of the brain showed decrease in the size of the left middle frontal gyrus mass and decrease in the surrounding vasogenic edema. The patient was advised to call immediately if she has any other concerning symptoms in the interval. The patient voices understanding of current disease status and treatment options and is in agreement with the current  care plan.  All questions were answered. The patient knows to call the clinic with any problems, questions or concerns. We can certainly see the patient much sooner if necessary.  The total time spent in the appointment was 20 minutes.  Disclaimer: This note was dictated with voice recognition software. Similar sounding words can inadvertently be transcribed and may not be corrected upon review.

## 2022-06-26 NOTE — Patient Instructions (Signed)
[facility] COCKTAIL 27% JUICE  [facility]  Discharge Instructions: Thank you for choosing [facility] to provide your oncology and hematology care.   If you have a lab appointment with the Cancer Center, please go directly to the Cancer Center and check in at the registration area.   Wear comfortable clothing and clothing appropriate for easy access to any Portacath or PICC line.   We strive to give you quality time with your provider. You may need to reschedule your appointment if you arrive late (15 or more minutes).  Arriving late affects you and other patients whose appointments are after yours.  Also, if you miss three or more appointments without notifying the office, you may be dismissed from the clinic at the provider's discretion.      For prescription refill requests, have your pharmacy contact our office and allow 72 hours for refills to be completed.    Today you received the following chemotherapy and/or immunotherapy agents: pembrolizumab, pemetrexed   To help prevent nausea and vomiting after your treatment, we encourage you to take your nausea medication as directed.  BELOW ARE SYMPTOMS THAT SHOULD BE REPORTED IMMEDIATELY: *FEVER GREATER THAN 100.4 F (38 C) OR HIGHER *CHILLS OR SWEATING *NAUSEA AND VOMITING THAT IS NOT CONTROLLED WITH YOUR NAUSEA MEDICATION *UNUSUAL SHORTNESS OF BREATH *UNUSUAL BRUISING OR BLEEDING *URINARY PROBLEMS (pain or burning when urinating, or frequent urination) *BOWEL PROBLEMS (unusual diarrhea, constipation, pain near the anus) TENDERNESS IN MOUTH AND THROAT WITH OR WITHOUT PRESENCE OF ULCERS (sore throat, sores in mouth, or a toothache) UNUSUAL RASH, SWELLING OR PAIN  UNUSUAL VAGINAL DISCHARGE OR ITCHING   Items with * indicate a potential emergency and should be followed up as soon as possible or go to the Emergency Department if any problems should occur.  Please show  the CHEMOTHERAPY ALERT CARD or IMMUNOTHERAPY ALERT CARD at check-in to the Emergency Department and triage nurse.  Should you have questions after your visit or need to cancel or reschedule your appointment, please contact [facility]  Dept: [phone]  and follow the prompts.  Office hours are 8:00 a.m. to 4:30 p.m. Monday - Friday. Please note that voicemails left after 4:00 p.m. may not be returned until the following business day.  We are closed weekends and major holidays. You have access to a nurse at all times for urgent questions. Please call the main number to the clinic Dept: [phone] and follow the prompts.   For any non-urgent questions, you may also contact your provider using MyChart. We now offer e-Visits for anyone 18 and older to request care online for non-urgent symptoms. For details visit [email or web address].   Also download the MyChart app! Go to the app store, search "MyChart", open the app, select [facility], and log in with your MyChart username and password. 

## 2022-06-26 NOTE — Telephone Encounter (Signed)
Will refer this to Kaanapali once she returns.

## 2022-06-26 NOTE — Progress Notes (Signed)
Patient seen by Dr. Mohamed  Vitals are within treatment parameters.  Labs reviewed: and are within treatment parameters.  Per physician team, patient is ready for treatment and there are NO modifications to the treatment plan.  

## 2022-06-26 NOTE — Progress Notes (Unsigned)
Patient seen by Dr. Mohamed  Vitals are within treatment parameters.  Labs reviewed: and are within treatment parameters.  Per physician team, patient is ready for treatment and there are NO modifications to the treatment plan.  

## 2022-06-27 ENCOUNTER — Telehealth: Payer: Self-pay | Admitting: Medical Oncology

## 2022-06-27 NOTE — Telephone Encounter (Signed)
Hyperglycemia-Per [employer].Carolyn Hooper is supposed to be taking metformin 1000 mg bid and Carolyn Hooper is thinking about putting [name] on Insulin.  Her nurse Toniann Fail is going to call Jaine with this information.Marland Kitchen

## 2022-06-27 NOTE — Telephone Encounter (Signed)
Patient r/ c °

## 2022-06-27 NOTE — Telephone Encounter (Signed)
Patient is wanting a insulin resistance level checked. She is wanting to wait until she gets all these labs back before she starts on insulin. Please advise

## 2022-06-27 NOTE — Telephone Encounter (Signed)
Her current order is metformin 1000 mg BID. However, her glucose is in the 300's and we need to add insulin she takes daily until she is not taking steroid any longer. Please let me know if she is willing to do this.  Jannifer Rodney, FNP

## 2022-06-28 LAB — T4: T4, Total: 10.9 ug/dL (ref 4.5–12.0)

## 2022-07-01 ENCOUNTER — Encounter: Payer: Self-pay | Admitting: Medical Oncology

## 2022-07-01 NOTE — Progress Notes (Signed)
Letter faxed to [employer] regarding her claim Faxed to [phone] and copy available to pt.

## 2022-07-02 ENCOUNTER — Other Ambulatory Visit: Payer: BC Managed Care – PPO

## 2022-07-02 ENCOUNTER — Other Ambulatory Visit: Payer: Self-pay | Admitting: Family Medicine

## 2022-07-02 ENCOUNTER — Other Ambulatory Visit: Payer: Self-pay | Admitting: Family

## 2022-07-02 DIAGNOSIS — G9389 Other specified disorders of brain: Secondary | ICD-10-CM

## 2022-07-02 DIAGNOSIS — E161 Other hypoglycemia: Secondary | ICD-10-CM | POA: Diagnosis not present

## 2022-07-02 DIAGNOSIS — E1165 Type 2 diabetes mellitus with hyperglycemia: Secondary | ICD-10-CM

## 2022-07-02 DIAGNOSIS — R6889 Other general symptoms and signs: Secondary | ICD-10-CM | POA: Diagnosis not present

## 2022-07-02 LAB — CBC WITH DIFFERENTIAL/PLATELET
Basophils Absolute: 0.1 10*3/uL (ref 0.0–0.2)
Eos: 5 %
Hematocrit: 34 % (ref 34.0–46.6)
Immature Granulocytes: 0 %
MCH: 33.5 pg — ABNORMAL HIGH (ref 26.6–33.0)
MCHC: 32.9 g/dL (ref 31.5–35.7)
Monocytes Absolute: 0.2 10*3/uL (ref 0.1–0.9)
Neutrophils: 55 %
WBC: 5.9 10*3/uL (ref 3.4–10.8)

## 2022-07-02 LAB — CMP14+EGFR

## 2022-07-02 LAB — BAYER DCA HB A1C WAIVED: HB A1C (BAYER DCA - WAIVED): 8.2 % — ABNORMAL HIGH (ref 4.8–5.6)

## 2022-07-02 LAB — INSULIN AND C-PEPTIDE, SERUM

## 2022-07-02 LAB — T4

## 2022-07-02 LAB — GLUCOSE HEMOCUE WAIVED: Glu Hemocue Waived: 298 mg/dL — ABNORMAL HIGH (ref 70–99)

## 2022-07-03 ENCOUNTER — Encounter: Payer: Self-pay | Admitting: Internal Medicine

## 2022-07-03 ENCOUNTER — Telehealth: Payer: Self-pay | Admitting: Medical Oncology

## 2022-07-03 LAB — CMP14+EGFR
ALT: 243 IU/L — ABNORMAL HIGH (ref 0–32)
Alkaline Phosphatase: 70 IU/L (ref 44–121)
BUN/Creatinine Ratio: 16 (ref 12–28)
BUN: 11 mg/dL (ref 8–27)
CO2: 19 mmol/L — ABNORMAL LOW (ref 20–29)
Calcium: 9.1 mg/dL (ref 8.7–10.3)
Chloride: 97 mmol/L (ref 96–106)
Creatinine, Ser: 0.7 mg/dL (ref 0.57–1.00)
Glucose: 291 mg/dL — ABNORMAL HIGH (ref 70–99)

## 2022-07-03 LAB — CBC WITH DIFFERENTIAL/PLATELET
Basos: 1 %
EOS (ABSOLUTE): 0.3 10*3/uL (ref 0.0–0.4)
Hemoglobin: 11.2 g/dL (ref 11.1–15.9)
Immature Grans (Abs): 0 10*3/uL (ref 0.0–0.1)
Lymphocytes Absolute: 2.1 10*3/uL (ref 0.7–3.1)
Lymphs: 35 %
MCV: 102 fL — ABNORMAL HIGH (ref 79–97)
Monocytes: 4 %
Neutrophils Absolute: 3.3 10*3/uL (ref 1.4–7.0)
Platelets: 149 10*3/uL — ABNORMAL LOW (ref 150–450)
RBC: 3.34 x10E6/uL — ABNORMAL LOW (ref 3.77–5.28)
RDW: 15.8 % — ABNORMAL HIGH (ref 11.7–15.4)

## 2022-07-03 LAB — INSULIN AND C-PEPTIDE, SERUM: INSULIN: 16.5 u[IU]/mL (ref 2.6–24.9)

## 2022-07-03 LAB — TSH: TSH: 3.99 u[IU]/mL (ref 0.450–4.500)

## 2022-07-03 NOTE — Telephone Encounter (Signed)
Going to use insulin pin to check glucose.

## 2022-07-04 ENCOUNTER — Telehealth: Payer: Self-pay | Admitting: Family

## 2022-07-04 ENCOUNTER — Telehealth: Payer: Self-pay | Admitting: Medical Oncology

## 2022-07-04 NOTE — Telephone Encounter (Signed)
Nausea- Very little food intake this week accompanied by nausea. She said this is the first time she has experienced nausea with her chemo.  She took ondansetron 4 mg yesterday  which she was not sure it helped because she took Clonazepam with it and slept some until she woke up early with nausea. She is drinking >64 oz /day. I instructed her to to a zofran 4 mg today without clonazepam.  She left a message with PCP re nausea too. She has a new diagnosis of Diabetes.

## 2022-07-04 NOTE — Telephone Encounter (Signed)
Patient calling because she is really tired and feels nauseous. Said she thinks she was recently diagnosed with diabetes but wants to talk to nurse about it.

## 2022-07-04 NOTE — Telephone Encounter (Signed)
Lmtcb.

## 2022-07-05 ENCOUNTER — Other Ambulatory Visit (HOSPITAL_COMMUNITY): Payer: Self-pay

## 2022-07-05 ENCOUNTER — Other Ambulatory Visit: Payer: Self-pay | Admitting: Family

## 2022-07-05 ENCOUNTER — Encounter: Payer: Self-pay | Admitting: Internal Medicine

## 2022-07-05 ENCOUNTER — Telehealth: Payer: Self-pay | Admitting: Family

## 2022-07-05 ENCOUNTER — Ambulatory Visit: Payer: BC Managed Care – PPO | Admitting: Family Medicine

## 2022-07-05 MED ORDER — INSULIN DEGLUDEC 100 UNIT/ML ~~LOC~~ SOPN
8.0000 [IU] | PEN_INJECTOR | Freq: Every day | SUBCUTANEOUS | 1 refills | Status: DC
  Filled 2022-07-05: qty 3, 37d supply, fill #0

## 2022-07-05 MED ORDER — INSULIN DEGLUDEC 100 UNIT/ML ~~LOC~~ SOPN: 8.0000 [IU] | PEN_INJECTOR | Freq: Every day | SUBCUTANEOUS | 1 refills | Status: DC

## 2022-07-05 NOTE — Telephone Encounter (Signed)
Pt needs the [place] Rx sent to [facility] in [place].  Says normally the [employer] would be ok but pt is staying in [place] right now and not [place].

## 2022-07-05 NOTE — Telephone Encounter (Signed)
Called through labs and spoke with patient and had discussion.

## 2022-07-05 NOTE — Telephone Encounter (Signed)
RESENT TO [facility]

## 2022-07-08 ENCOUNTER — Telehealth: Payer: Self-pay | Admitting: Family

## 2022-07-08 ENCOUNTER — Telehealth: Payer: Self-pay | Admitting: Medical Oncology

## 2022-07-08 DIAGNOSIS — E1165 Type 2 diabetes mellitus with hyperglycemia: Secondary | ICD-10-CM

## 2022-07-08 NOTE — Telephone Encounter (Signed)
Letter for insurance left upfront for pt to pick up.

## 2022-07-08 NOTE — Telephone Encounter (Signed)
FYI-pt said she is taking Metformin 1000 mg bid. She cannot afford tresiba med for diabetes and PCP is working on assistance for it.

## 2022-07-09 ENCOUNTER — Other Ambulatory Visit (HOSPITAL_COMMUNITY): Payer: Self-pay

## 2022-07-09 MED ORDER — PEN NEEDLES 32G X 4 MM MISC
11 refills | Status: DC
Start: 2022-07-09 — End: 2022-11-27
  Filled 2022-07-09: qty 100, 90d supply, fill #0

## 2022-07-09 MED ORDER — LANTUS SOLOSTAR 100 UNIT/ML ~~LOC~~ SOPN
8.0000 [IU] | PEN_INJECTOR | Freq: Every day | SUBCUTANEOUS | 99 refills | Status: DC
Start: 2022-07-09 — End: 2022-08-15
  Filled 2022-07-09: qty 6, 45d supply, fill #0
  Filled 2022-08-15 (×2): qty 6, 50d supply, fill #1
  Filled 2022-08-15: qty 6, 45d supply, fill #1

## 2022-07-09 MED ORDER — TOUJEO SOLOSTAR 300 UNIT/ML ~~LOC~~ SOPN
8.0000 [IU] | PEN_INJECTOR | Freq: Every evening | SUBCUTANEOUS | 1 refills | Status: DC
  Filled 2022-07-09: qty 1.5, 57d supply, fill #0

## 2022-07-09 NOTE — Telephone Encounter (Signed)
Tresiba changed to [employer]. Please make her an appointment with Raynelle Fanning for educations and to help get medications.

## 2022-07-09 NOTE — Telephone Encounter (Signed)
Patient aware and verbalized understanding. °

## 2022-07-09 NOTE — Telephone Encounter (Signed)
LMTCB

## 2022-07-09 NOTE — Addendum Note (Signed)
Addended by: Vanice Sarah D on: 07/09/2022 03:58 PM   Modules accepted: Orders

## 2022-07-09 NOTE — Telephone Encounter (Signed)
Sent in Lantus (seems to be preferred and generic now) to see if cheaper;  Please let me know if patient can't afford.  Also sent in pen needles.  Feel to free to place [id number] if pharmd needed for further management.  She sees PCP on 4/26.

## 2022-07-10 ENCOUNTER — Telehealth: Payer: Self-pay | Admitting: Family

## 2022-07-10 NOTE — Telephone Encounter (Signed)
*  Patient stated that if she is unable to answer, please leave a complete and detailed message due to the fact that she has other appts and may not be around her phone for most of the day*

## 2022-07-10 NOTE — Telephone Encounter (Signed)
Having chemo next Wed  Slight yeast infection- has monistat  Newly diagnosed DM- not sure if sugars are being taken accurately because of tremors.  The two numbers taken around 1-1:30  Pt does not have any other symptoms.  Pt is taking Metformin 1000 BID  Pt is not taking Lantus because she does not know how to give,  Pt has an appt with [name] this Friday and was going to review at that time

## 2022-07-10 NOTE — Telephone Encounter (Signed)
Yes education on teaching her how to do the low-dose Lantus would help her blood sugars.  They are probably elevated because of the prednisone which prednisone does elevate the blood sugars.  Please have her make a nurse appointment to triage sooner if she needs help learning how to give the medicine or she can ask the pharmacist where she picked up the medicine from and they could help her learn how to do it as well.

## 2022-07-10 NOTE — Telephone Encounter (Signed)
Patient wants to make sure what medications she is supposed to be taking. Wants to know if she needs to continue metformin while taking lantus. Please call back and advise.

## 2022-07-10 NOTE — Telephone Encounter (Signed)
Left detailed message per message she will need to take both.

## 2022-07-10 NOTE — Telephone Encounter (Signed)
Pt made aware of recommendations. Nurse visit scheduled for 10;30 on 4/25.

## 2022-07-11 ENCOUNTER — Other Ambulatory Visit: Payer: Self-pay

## 2022-07-11 ENCOUNTER — Other Ambulatory Visit (HOSPITAL_COMMUNITY): Payer: Self-pay

## 2022-07-11 ENCOUNTER — Ambulatory Visit: Payer: BC Managed Care – PPO

## 2022-07-11 DIAGNOSIS — E1165 Type 2 diabetes mellitus with hyperglycemia: Secondary | ICD-10-CM

## 2022-07-11 MED ORDER — INSULIN GLARGINE 100 UNIT/ML ~~LOC~~ SOLN
8.0000 [IU] | Freq: Every day | SUBCUTANEOUS | Status: DC
Start: 2022-07-11 — End: 2022-07-17

## 2022-07-12 ENCOUNTER — Ambulatory Visit (INDEPENDENT_AMBULATORY_CARE_PROVIDER_SITE_OTHER): Payer: BC Managed Care – PPO | Admitting: Family

## 2022-07-12 ENCOUNTER — Encounter: Payer: Self-pay | Admitting: Family

## 2022-07-12 ENCOUNTER — Other Ambulatory Visit: Payer: Self-pay

## 2022-07-12 ENCOUNTER — Other Ambulatory Visit (HOSPITAL_COMMUNITY): Payer: Self-pay

## 2022-07-12 VITALS — BP 133/80 | HR 78 | Temp 98.0°F | Ht 68.0 in | Wt 244.2 lb

## 2022-07-12 DIAGNOSIS — Z79899 Other long term (current) drug therapy: Secondary | ICD-10-CM

## 2022-07-12 DIAGNOSIS — E1165 Type 2 diabetes mellitus with hyperglycemia: Secondary | ICD-10-CM

## 2022-07-12 DIAGNOSIS — C3490 Malignant neoplasm of unspecified part of unspecified bronchus or lung: Secondary | ICD-10-CM | POA: Diagnosis not present

## 2022-07-12 DIAGNOSIS — R748 Abnormal levels of other serum enzymes: Secondary | ICD-10-CM

## 2022-07-12 DIAGNOSIS — Z794 Long term (current) use of insulin: Secondary | ICD-10-CM

## 2022-07-12 DIAGNOSIS — F5101 Primary insomnia: Secondary | ICD-10-CM | POA: Diagnosis not present

## 2022-07-12 DIAGNOSIS — F1320 Sedative, hypnotic or anxiolytic dependence, uncomplicated: Secondary | ICD-10-CM

## 2022-07-12 DIAGNOSIS — C7931 Secondary malignant neoplasm of brain: Secondary | ICD-10-CM | POA: Diagnosis not present

## 2022-07-12 DIAGNOSIS — F411 Generalized anxiety disorder: Secondary | ICD-10-CM | POA: Diagnosis not present

## 2022-07-12 MED ORDER — CLONAZEPAM 0.5 MG PO TABS
0.2500 mg | ORAL_TABLET | Freq: Every day | ORAL | 2 refills | Status: DC
Start: 2022-07-12 — End: 2022-08-15
  Filled 2022-07-12: qty 30, 60d supply, fill #0

## 2022-07-12 MED ORDER — FLUCONAZOLE 150 MG PO TABS
150.0000 mg | ORAL_TABLET | ORAL | 0 refills | Status: DC | PRN
  Filled 2022-07-12: qty 3, 9d supply, fill #0

## 2022-07-12 NOTE — Patient Instructions (Signed)

## 2022-07-12 NOTE — Progress Notes (Signed)
Subjective:    Patient ID: Carolyn Hooper, female    DOB: 1960/06/07, 62 y.o.   MRN: [id number]  Chief Complaint  Patient presents with   Diabetes   PT presents to the office today to discuss uncontrolled DM. She has malignant lung metastatic to brain. She is taking  dexamethasone 1 mg daily. Her glucose is >250. She was started on Lantus 8 units daily. She had trouble getting insulin from insurance and this was a delay.   She also had elevated liver enzymes. Will recheck today. She has a CT abdomen and CT chest scheduled on 07/15/22.  Diabetes She presents for her follow-up diabetic visit. She has type 2 diabetes mellitus. Hypoglycemia symptoms include nervousness/anxiousness. Associated symptoms include blurred vision and foot paresthesias. Symptoms are stable. Risk factors for coronary artery disease include dyslipidemia, diabetes mellitus and hypertension. She is following a generally healthy diet. Her overall blood glucose range is >200 mg/dl.  Insomnia Primary symptoms: difficulty falling asleep, frequent awakening.   The current episode started more than one year. The onset quality is gradual. The problem occurs intermittently.  Anxiety Presents for follow-up visit. Symptoms include excessive worry, insomnia, nervous/anxious behavior, palpitations and restlessness. Symptoms occur most days. The severity of symptoms is moderate.        Review of Systems  Eyes:  Positive for blurred vision.  Cardiovascular:  Positive for palpitations.  Psychiatric/Behavioral:  The patient is nervous/anxious and has insomnia.        Objective:   Physical Exam Vitals reviewed.  Constitutional:      General: She is not in acute distress.    Appearance: She is well-developed. She is obese.  HENT:     Head: Normocephalic and atraumatic.  Eyes:     Pupils: Pupils are equal, round, and reactive to light.  Neck:     Thyroid: No thyromegaly.  Cardiovascular:     Rate and Rhythm: Normal rate  and regular rhythm.     Heart sounds: Normal heart sounds. No murmur heard. Pulmonary:     Effort: Pulmonary effort is normal. No respiratory distress.     Breath sounds: No wheezing.     Comments: Diminished  Abdominal:     General: Bowel sounds are normal. There is no distension.     Palpations: Abdomen is soft.     Tenderness: There is no abdominal tenderness.  Musculoskeletal:        General: No tenderness. Normal range of motion.     Cervical back: Normal range of motion and neck supple.     Right lower leg: Edema (trace) present.     Left lower leg: Edema (trace) present.  Skin:    General: Skin is warm and dry.  Neurological:     Mental Status: She is alert and oriented to person, place, and time.     Cranial Nerves: No cranial nerve deficit.     Deep Tendon Reflexes: Reflexes are normal and symmetric.  Psychiatric:        Behavior: Behavior normal.        Thought Content: Thought content normal.        Judgment: Judgment normal.     Comments: Slight aphasia        BP 133/80   Pulse 78   Temp 98 F (36.7 C) (Temporal)   Ht 5\' 8"  (1.727 m)   Wt 244 lb 3.2 oz (110.8 kg)   SpO2 96%   BMI 37.13 kg/m  Assessment & Plan:  MADELON WELSCH comes in today with chief complaint of Diabetes   Diagnosis and orders addressed:  1. Uncontrolled type 2 diabetes mellitus with hyperglycemia, without long-term current use of insulin (HCC) Increase Lantus to 10 units, then 2 units every day until glucose is <180's  in AM Strict low carb diet  - BMP8+EGFR  2. 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) - BMP8+EGFR  3. Primary insomnia - BMP8+EGFR  4. GAD (generalized anxiety disorder) - BMP8+EGFR  5. Generalized anxiety disorder - clonazePAM (KLONOPIN) 0.5 MG tablet; Take 1/2 tablet (0.25 mg) by mouth at bedtime. For anxiety  Dispense: 30 tablet; Refill: 2 - BMP8+EGFR  6. Benzodiazepine dependence (HCC) - clonazePAM (KLONOPIN) 0.5 MG tablet; Take 1/2  tablet (0.25 mg) by mouth at bedtime. For anxiety  Dispense: 30 tablet; Refill: 2 - BMP8+EGFR  7. Controlled substance agreement signed  - clonazePAM (KLONOPIN) 0.5 MG tablet; Take 1/2 tablet (0.25 mg) by mouth at bedtime. For anxiety  Dispense: 30 tablet; Refill: 2 - BMP8+EGFR  8. Elevated liver enzymes - Hepatic function panel   Labs pending Keep follow up with Oncologists and CT scans Health Maintenance reviewed Diet and exercise encouraged  Follow up plan: 1 month    Jannifer Rodney, FNP

## 2022-07-13 ENCOUNTER — Other Ambulatory Visit (HOSPITAL_COMMUNITY): Payer: Self-pay

## 2022-07-13 ENCOUNTER — Encounter: Payer: Self-pay | Admitting: Internal Medicine

## 2022-07-13 LAB — BMP8+EGFR
BUN/Creatinine Ratio: 16 (ref 12–28)
BUN: 11 mg/dL (ref 8–27)
CO2: 18 mmol/L — ABNORMAL LOW (ref 20–29)
Calcium: 9.2 mg/dL (ref 8.7–10.3)
Chloride: 101 mmol/L (ref 96–106)
Creatinine, Ser: 0.67 mg/dL (ref 0.57–1.00)
Glucose: 289 mg/dL — ABNORMAL HIGH (ref 70–99)
Potassium: 3.7 mmol/L (ref 3.5–5.2)
Sodium: 141 mmol/L (ref 134–144)
eGFR: 99 mL/min/{1.73_m2} (ref >59)

## 2022-07-13 LAB — HEPATIC FUNCTION PANEL
ALT: 84 IU/L — ABNORMAL HIGH (ref 0–32)
AST: 32 IU/L (ref 0–40)
Albumin: 3.8 g/dL — ABNORMAL LOW (ref 3.9–4.9)
Alkaline Phosphatase: 71 IU/L (ref 44–121)
Bilirubin Total: 0.4 mg/dL (ref 0.0–1.2)
Bilirubin, Direct: 0.2 mg/dL (ref 0.00–0.40)
Total Protein: 5.9 g/dL — ABNORMAL LOW (ref 6.0–8.5)

## 2022-07-15 ENCOUNTER — Ambulatory Visit (HOSPITAL_COMMUNITY)
Admission: RE | Admit: 2022-07-15 | Discharge: 2022-07-15 | Disposition: A | Discharge status: Home / Self Care | Payer: BC Managed Care – PPO | Source: Ambulatory Visit | Attending: Internal Medicine | Admitting: Internal Medicine

## 2022-07-15 DIAGNOSIS — K8020 Calculus of gallbladder without cholecystitis without obstruction: Secondary | ICD-10-CM | POA: Diagnosis not present

## 2022-07-15 DIAGNOSIS — C3490 Malignant neoplasm of unspecified part of unspecified bronchus or lung: Secondary | ICD-10-CM

## 2022-07-15 DIAGNOSIS — J90 Pleural effusion, not elsewhere classified: Secondary | ICD-10-CM | POA: Diagnosis not present

## 2022-07-15 MED ORDER — HEPARIN SOD (PORK) LOCK FLUSH 100 UNIT/ML IV SOLN
INTRAVENOUS | Status: AC
  Filled 2022-07-15: qty 5

## 2022-07-15 MED ORDER — HEPARIN SOD (PORK) LOCK FLUSH 100 UNIT/ML IV SOLN
500.0000 [IU] | Freq: Once | INTRAVENOUS | Status: AC
  Administered 2022-07-15: 500 [IU] via INTRAVENOUS

## 2022-07-15 MED ORDER — IOHEXOL 300 MG/ML  SOLN
100.0000 mL | Freq: Once | INTRAMUSCULAR | Status: AC | PRN
  Administered 2022-07-15: 100 mL via INTRAVENOUS

## 2022-07-15 NOTE — Progress Notes (Deleted)
[facility] OFFICE PROGRESS NOTE  Carolyn Spencer, FNP [address] [place] [address]  DIAGNOSIS: Stage IV (T3, N0, M1b) non-small cell lung cancer, adenosquamous carcinoma presented with large medial left lower lobe lung mass with bilateral pulmonary nodules and metastatic disease to the brain diagnosed in November 2023   PRIOR THERAPY: Status post SRS to brain metastasis completed March 26, 2022   CURRENT THERAPY: Systemic chemotherapy with carboplatin for AUC of 5, Alimta 500 Mg/M2 and Keytruda 200 Mg IV every 3 weeks status post 6 cycles. First dose was given on March 06, 2022.  Starting from cycle #5, the patient started maintenance Alimta and Keytruda IV every 3 weeks.   INTERVAL HISTORY: Carolyn Hooper 62 y.o. female returns to clinic today for follow-up visit accompanied by ***.  The patient is currently undergoing maintenance immunotherapy and chemotherapy.  She tolerates it well except she did experience nausea for the first time in the interval since last being seen.  She has a prescription for Compazine and Zofran.  She was taking ***which is effective for her.  She also saw her PCP in the interval for insomnia, anxiety, and diabetes.  Today, the patient denies any fever, chills, or night sweats.  Weight gain?  Pitting edema?  Shortness of breath and cough?  She denies any chest pain or hemoptysis.  Vomiting?  She denies any diarrhea or constipation.  Nondiabetic protein drinks?  Despite having diabetes.  She sees Dr. Barbaraann Cao for history of metastatic disease to the brain.  She is currently on a Decadron taper and she is currently taking ***milligrams.  She recently had a restaging CT scan performed.  She is here today for evaluation to review her scan results before starting cycle #7.  MEDICAL HISTORY: Past Medical History:  Diagnosis Date   Abscess    hydroadenitis superativa   Anxiety    DM (diabetes mellitus) (HCC)    Hyperinsulinemia     Hypertension 1983   gestational - pre-eclampsia   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[facility])    Neuroma    audiotry neuroma   Seizure (HCC)    Vertigo     ALLERGIES:  is allergic to adhesive [tape] and statins.  MEDICATIONS:  Current Outpatient Medications  Medication Sig Dispense Refill   albuterol (VENTOLIN HFA) 108 (90 Base) MCG/ACT inhaler Inhale 2 puffs into the lungs every 6 (six) hours as needed. 18 g 2   cephALEXin (KEFLEX) 500 MG capsule Take 1 capsule (500 mg total) by mouth 2 (two) times daily. (Patient not taking: Reported on 07/12/2022) 14 capsule 0   clonazePAM (KLONOPIN) 0.5 MG tablet Take 1/2 tablet (0.25 mg) by mouth at bedtime. For anxiety 30 tablet 2   dexamethasone (DECADRON) 1 MG tablet Take 3 tablets (3 mg total) by mouth daily with breakfast. 90 tablet 1   fluconazole (DIFLUCAN) 150 MG tablet Take 1 tablet (150 mg) by mouth every 3 days as needed. 3 tablet 0   folic acid (FOLVITE) 1 MG tablet Take 1 tablet (1 mg total) by mouth daily. 30 tablet 1   furosemide (LASIX) 20 MG tablet Take 1 tablet (20 mg total) by mouth daily as needed. 10 tablet 0   HYDROcodone-acetaminophen (NORCO) 5-325 MG tablet Take 1 - 2 tablets by mouth every 6 (six) hours as needed for moderate pain. 30 tablet 0   insulin glargine (LANTUS SOLOSTAR) 100 UNIT/ML Solostar Pen Inject 8 Units into the skin daily. 15 mL PRN  Insulin Pen Needle (PEN NEEDLES) 32G X 4 MM MISC Use to inject insulin daily as directed. 100 each 11   levETIRAcetam (KEPPRA) 1000 MG tablet Take 1 tablet (1,000 mg total) by mouth 2 (two) times daily. 60 tablet 1   lidocaine (XYLOCAINE) 5 % ointment Apply topically as needed. 60 g 1   lidocaine-prilocaine (EMLA) cream Apply to port prior to access 30 g 0   magic mouthwash (nystatin, diphenhydrAMINE, alum & mag hydroxide) suspension mixture Take 5 mLs by mouth 3 (three) times daily as needed for mouth pain. 240 mL 1   metFORMIN (GLUCOPHAGE) 1000 MG tablet Take 1 tablet (1,000 mg  total) by mouth 2 (two) times daily. 180 tablet 1   ondansetron (ZOFRAN) 4 MG tablet Take 1 tablet (4 mg total) by mouth every 6 (six) hours as needed for nausea. 30 tablet 2   ondansetron (ZOFRAN) 8 MG tablet Take 1 tablet (8 mg total) by mouth 3 (three) times daily. 20 tablet 1   prochlorperazine (COMPAZINE) 10 MG tablet Take 1 tablet (10 mg total) by mouth every 6 (six) hours as needed. 30 tablet 2   Current Facility-Administered Medications  Medication Dose Route Frequency Provider Last Rate Last Admin   insulin glargine (LANTUS) injection 8 Units  8 Units Subcutaneous Daily [name], FNP        SURGICAL HISTORY:  Past Surgical History:  Procedure Laterality Date   ABDOMINAL HYSTERECTOMY  2006   remaining left ovary   BRONCHIAL BIOPSY  02/19/2022   Procedure: BRONCHIAL BIOPSIES;  Surgeon: Josephine Igo, DO;  Location: [facility];  Service: Pulmonary;;   BRONCHIAL BRUSHINGS  02/19/2022   Procedure: BRONCHIAL BRUSHINGS;  Surgeon: Josephine Igo, DO;  Location: [facility];  Service: Pulmonary;;   BRONCHIAL NEEDLE ASPIRATION BIOPSY  02/19/2022   Procedure: BRONCHIAL NEEDLE ASPIRATION BIOPSIES;  Surgeon: Josephine Igo, DO;  Location: [facility];  Service: Pulmonary;;   IR IMAGING GUIDED PORT INSERTION  03/29/2022   SHOULDER SURGERY Right 1960s    REVIEW OF SYSTEMS:   Review of Systems  Constitutional: Negative for appetite change, chills, fatigue, fever and unexpected weight change.  HENT:   Negative for mouth sores, nosebleeds, sore throat and trouble swallowing.   Eyes: Negative for eye problems and icterus.  Respiratory: Negative for cough, hemoptysis, shortness of breath and wheezing.   Cardiovascular: Negative for chest pain and leg swelling.  Gastrointestinal: Negative for abdominal pain, constipation, diarrhea, nausea and vomiting.  Genitourinary: Negative for bladder incontinence, difficulty urinating, dysuria, frequency and hematuria.   Musculoskeletal:  Negative for back pain, gait problem, neck pain and neck stiffness.  Skin: Negative for itching and rash.  Neurological: Negative for dizziness, extremity weakness, gait problem, headaches, light-headedness and seizures.  Hematological: Negative for adenopathy. Does not bruise/bleed easily.  Psychiatric/Behavioral: Negative for confusion, depression and sleep disturbance. The patient is not nervous/anxious.     PHYSICAL EXAMINATION:  There were no vitals taken for this visit.  ECOG PERFORMANCE STATUS: {CHL ONC ECOG [id number]  Physical Exam  Constitutional: Oriented to person, place, and time and well-developed, well-nourished, and in no distress. No distress.  HENT:  Head: Normocephalic and atraumatic.  Mouth/Throat: Oropharynx is clear and moist. No oropharyngeal exudate.  Eyes: Conjunctivae are normal. Right eye exhibits no discharge. Left eye exhibits no discharge. No scleral icterus.  Neck: Normal range of motion. Neck supple.  Cardiovascular: Normal rate, regular rhythm, normal heart sounds and intact distal pulses.   Pulmonary/Chest: Effort normal and breath  sounds normal. No respiratory distress. No wheezes. No rales.  Abdominal: Soft. Bowel sounds are normal. Exhibits no distension and no mass. There is no tenderness.  Musculoskeletal: Normal range of motion. Exhibits no edema.  Lymphadenopathy:    No cervical adenopathy.  Neurological: Alert and oriented to person, place, and time. Exhibits normal muscle tone. Gait normal. Coordination normal.  Skin: Skin is warm and dry. No rash noted. Not diaphoretic. No erythema. No pallor.  Psychiatric: Mood, memory and judgment normal.  Vitals reviewed.  LABORATORY DATA: Lab Results  Component Value Date   WBC 5.9 07/02/2022   HGB 11.2 07/02/2022   HCT 34.0 07/02/2022   MCV 102 (H) 07/02/2022   PLT 149 (L) 07/02/2022      Chemistry      Component Value Date/Time   NA 141 07/12/2022 1114   K 3.7 07/12/2022 1114   CL 101  07/12/2022 1114   CO2 18 (L) 07/12/2022 1114   BUN 11 07/12/2022 1114   CREATININE 0.67 07/12/2022 1114   CREATININE 0.75 06/26/2022 1029      Component Value Date/Time   CALCIUM 9.2 07/12/2022 1114   ALKPHOS 71 07/12/2022 1114   AST 32 07/12/2022 1114   AST 22 06/26/2022 1029   ALT 84 (H) 07/12/2022 1114   ALT 76 (H) 06/26/2022 1029   BILITOT 0.4 07/12/2022 1114   BILITOT 0.6 06/26/2022 1029       RADIOGRAPHIC STUDIES:  No results found.   ASSESSMENT/PLAN:  his is a very pleasant 62 year old Caucasian female diagnosed with stage IV (T3, N0, M1 B) non-small cell lung cancer, adenosquamous carcinoma.  She presented with a large medial left lower lobe lung mass with bilateral pulmonary nodules metastatic disease to the brain.  She was diagnosed in November 2023.   She completed SRS to the metastatic brain lesions on 03/26/2022.   She is currently on palliative systemic chemotherapy with carboplatin for an AUC of 5, Alimta 500 mg/m, and Keytruda 20 mg IV every 3 weeks.  Starting from cycle #5, she started maintenance treatment with Keytruda and Alimta.  She is status post 6 cycles of treatment total.   The patient recently had a restaging CT scan performed.  The patient was seen Dr. Arbutus Ped today.  Dr. Arbutus Ped personally and independently reviewed the scan and discussed the results with the patient today.  The scan showed ***.  After [facility] recommend that she continue on same treatment at the same dose.  She will proceed with cycle number 7 today's schedule.  We will see her back for a follow up visit in 3 weeks for repeat blood work before starting cycle #8 .  The patient was advised to call immediately if she has any concerning symptoms in the interval. The patient voices understanding of current disease status and treatment options and is in agreement with the current care plan. All questions were answered. The patient knows to call the clinic with any problems, questions or  concerns. We can certainly see the patient much sooner if necessary   No orders of the defined types were placed in this encounter.    I spent {CHL ONC TIME VISIT - [id number]} counseling the patient face to face. The total time spent in the appointment was {CHL ONC TIME VISIT - BJYNW:[id number]}.  Honore Wipperfurth L Tanis Hensarling, PA-C 07/15/22

## 2022-07-16 ENCOUNTER — Observation Stay (HOSPITAL_COMMUNITY)
Admission: EM | Admit: 2022-07-16 | Discharge: 2022-07-17 | Disposition: A | Discharge status: Home / Self Care | Payer: BC Managed Care – PPO | Attending: Internal Medicine | Admitting: Internal Medicine

## 2022-07-16 ENCOUNTER — Emergency Department (HOSPITAL_COMMUNITY): Payer: BC Managed Care – PPO

## 2022-07-16 ENCOUNTER — Encounter (HOSPITAL_COMMUNITY): Payer: Self-pay | Admitting: *Deleted

## 2022-07-16 ENCOUNTER — Other Ambulatory Visit: Payer: Self-pay

## 2022-07-16 ENCOUNTER — Telehealth: Payer: Self-pay | Admitting: Medical Oncology

## 2022-07-16 DIAGNOSIS — R519 Headache, unspecified: Secondary | ICD-10-CM | POA: Diagnosis not present

## 2022-07-16 DIAGNOSIS — R140 Abdominal distension (gaseous): Secondary | ICD-10-CM | POA: Diagnosis not present

## 2022-07-16 DIAGNOSIS — Z7984 Long term (current) use of oral hypoglycemic drugs: Secondary | ICD-10-CM | POA: Insufficient documentation

## 2022-07-16 DIAGNOSIS — C3490 Malignant neoplasm of unspecified part of unspecified bronchus or lung: Secondary | ICD-10-CM | POA: Insufficient documentation

## 2022-07-16 DIAGNOSIS — K8020 Calculus of gallbladder without cholecystitis without obstruction: Principal | ICD-10-CM | POA: Diagnosis present

## 2022-07-16 DIAGNOSIS — C7931 Secondary malignant neoplasm of brain: Secondary | ICD-10-CM | POA: Diagnosis not present

## 2022-07-16 DIAGNOSIS — R600 Localized edema: Secondary | ICD-10-CM | POA: Insufficient documentation

## 2022-07-16 DIAGNOSIS — Z79899 Other long term (current) drug therapy: Secondary | ICD-10-CM | POA: Diagnosis not present

## 2022-07-16 DIAGNOSIS — T380X1A Poisoning by glucocorticoids and synthetic analogues, accidental (unintentional), initial encounter: Secondary | ICD-10-CM | POA: Insufficient documentation

## 2022-07-16 DIAGNOSIS — Z1152 Encounter for screening for COVID-19: Secondary | ICD-10-CM | POA: Diagnosis not present

## 2022-07-16 DIAGNOSIS — R059 Cough, unspecified: Secondary | ICD-10-CM | POA: Insufficient documentation

## 2022-07-16 DIAGNOSIS — R079 Chest pain, unspecified: Secondary | ICD-10-CM | POA: Diagnosis not present

## 2022-07-16 DIAGNOSIS — R0902 Hypoxemia: Secondary | ICD-10-CM | POA: Diagnosis not present

## 2022-07-16 DIAGNOSIS — Z87891 Personal history of nicotine dependence: Secondary | ICD-10-CM | POA: Diagnosis not present

## 2022-07-16 DIAGNOSIS — I10 Essential (primary) hypertension: Secondary | ICD-10-CM | POA: Diagnosis not present

## 2022-07-16 DIAGNOSIS — E0965 Drug or chemical induced diabetes mellitus with hyperglycemia: Secondary | ICD-10-CM | POA: Insufficient documentation

## 2022-07-16 DIAGNOSIS — R739 Hyperglycemia, unspecified: Secondary | ICD-10-CM | POA: Diagnosis not present

## 2022-07-16 DIAGNOSIS — R0689 Other abnormalities of breathing: Secondary | ICD-10-CM | POA: Diagnosis not present

## 2022-07-16 DIAGNOSIS — K828 Other specified diseases of gallbladder: Secondary | ICD-10-CM | POA: Diagnosis not present

## 2022-07-16 DIAGNOSIS — E119 Type 2 diabetes mellitus without complications: Secondary | ICD-10-CM | POA: Diagnosis not present

## 2022-07-16 DIAGNOSIS — R110 Nausea: Secondary | ICD-10-CM

## 2022-07-16 DIAGNOSIS — Z794 Long term (current) use of insulin: Secondary | ICD-10-CM | POA: Diagnosis not present

## 2022-07-16 DIAGNOSIS — R000 Tachycardia, unspecified: Secondary | ICD-10-CM | POA: Diagnosis not present

## 2022-07-16 DIAGNOSIS — R1011 Right upper quadrant pain: Secondary | ICD-10-CM | POA: Diagnosis not present

## 2022-07-16 DIAGNOSIS — D72829 Elevated white blood cell count, unspecified: Secondary | ICD-10-CM | POA: Diagnosis not present

## 2022-07-16 DIAGNOSIS — R112 Nausea with vomiting, unspecified: Secondary | ICD-10-CM | POA: Diagnosis not present

## 2022-07-16 DIAGNOSIS — R748 Abnormal levels of other serum enzymes: Secondary | ICD-10-CM | POA: Diagnosis not present

## 2022-07-16 DIAGNOSIS — R109 Unspecified abdominal pain: Secondary | ICD-10-CM | POA: Diagnosis not present

## 2022-07-16 DIAGNOSIS — C3492 Malignant neoplasm of unspecified part of left bronchus or lung: Secondary | ICD-10-CM

## 2022-07-16 DIAGNOSIS — R569 Unspecified convulsions: Secondary | ICD-10-CM | POA: Diagnosis not present

## 2022-07-16 LAB — COMPREHENSIVE METABOLIC PANEL
ALT: 56 U/L — ABNORMAL HIGH (ref 0–44)
AST: 24 U/L (ref 15–41)
Albumin: 3.3 g/dL — ABNORMAL LOW (ref 3.5–5.0)
Alkaline Phosphatase: 57 U/L (ref 38–126)
Anion gap: 13 (ref 5–15)
BUN: 9 mg/dL (ref 8–23)
CO2: 23 mmol/L (ref 22–32)
Calcium: 9.2 mg/dL (ref 8.9–10.3)
Chloride: 103 mmol/L (ref 98–111)
Creatinine, Ser: 0.76 mg/dL (ref 0.44–1.00)
GFR, Estimated: >60 mL/min (ref >60)
Glucose, Bld: 253 mg/dL — ABNORMAL HIGH (ref 70–99)
Potassium: 3.6 mmol/L (ref 3.5–5.1)
Sodium: 139 mmol/L (ref 135–145)
Total Bilirubin: 0.7 mg/dL (ref 0.3–1.2)
Total Protein: 6.5 g/dL (ref 6.5–8.1)

## 2022-07-16 LAB — CBC WITH DIFFERENTIAL/PLATELET
Abs Immature Granulocytes: 0.06 10*3/uL (ref 0.00–0.07)
Basophils Absolute: 0.1 10*3/uL (ref 0.0–0.1)
Basophils Relative: 0 %
Eosinophils Absolute: 0.8 10*3/uL — ABNORMAL HIGH (ref 0.0–0.5)
Eosinophils Relative: 6 %
HCT: 39.2 % (ref 36.0–46.0)
Hemoglobin: 12.6 g/dL (ref 12.0–15.0)
Immature Granulocytes: 0 %
Lymphocytes Relative: 4 %
Lymphs Abs: 0.6 10*3/uL — ABNORMAL LOW (ref 0.7–4.0)
MCH: 33 pg (ref 26.0–34.0)
MCHC: 32.1 g/dL (ref 30.0–36.0)
MCV: 102.6 fL — ABNORMAL HIGH (ref 80.0–100.0)
Monocytes Absolute: 1.1 10*3/uL — ABNORMAL HIGH (ref 0.1–1.0)
Monocytes Relative: 8 %
Neutro Abs: 11.4 10*3/uL — ABNORMAL HIGH (ref 1.7–7.7)
Neutrophils Relative %: 82 %
Platelets: 185 10*3/uL (ref 150–400)
RBC: 3.82 MIL/uL — ABNORMAL LOW (ref 3.87–5.11)
RDW: 15.3 % (ref 11.5–15.5)
WBC: 14 10*3/uL — ABNORMAL HIGH (ref 4.0–10.5)
nRBC: 0 % (ref 0.0–0.2)

## 2022-07-16 LAB — LIPASE, BLOOD: Lipase: 391 U/L — ABNORMAL HIGH (ref 11–51)

## 2022-07-16 LAB — URINALYSIS, ROUTINE W REFLEX MICROSCOPIC
Bilirubin Urine: NEGATIVE
Glucose, UA: 150 mg/dL — AB
Hgb urine dipstick: NEGATIVE
Ketones, ur: 5 mg/dL — AB
Nitrite: NEGATIVE
Protein, ur: NEGATIVE mg/dL
Specific Gravity, Urine: 1.017 (ref 1.005–1.030)
pH: 5 (ref 5.0–8.0)

## 2022-07-16 LAB — RESP PANEL BY RT-PCR (RSV, FLU A&B, COVID)  RVPGX2
Influenza A by PCR: NEGATIVE
Influenza B by PCR: NEGATIVE
Resp Syncytial Virus by PCR: NEGATIVE
SARS Coronavirus 2 by RT PCR: NEGATIVE

## 2022-07-16 LAB — GLUCOSE, CAPILLARY
Glucose-Capillary: 312 mg/dL — ABNORMAL HIGH (ref 70–99)
Glucose-Capillary: 247 mg/dL — ABNORMAL HIGH (ref 70–99)

## 2022-07-16 LAB — MAGNESIUM: Magnesium: 1.4 mg/dL — ABNORMAL LOW (ref 1.7–2.4)

## 2022-07-16 MED ORDER — LEVETIRACETAM IN NACL 1000 MG/100ML IV SOLN
1000.0000 mg | Freq: Once | INTRAVENOUS | Status: AC
  Administered 2022-07-16: 1000 mg via INTRAVENOUS
  Filled 2022-07-16: qty 100

## 2022-07-16 MED ORDER — LEVETIRACETAM 500 MG PO TABS
1000.0000 mg | ORAL_TABLET | Freq: Two times a day (BID) | ORAL | Status: DC
  Administered 2022-07-16 – 2022-07-17 (×2): 1000 mg via ORAL
  Filled 2022-07-16 (×2): qty 2

## 2022-07-16 MED ORDER — METOCLOPRAMIDE HCL 5 MG/ML IJ SOLN
10.0000 mg | Freq: Once | INTRAMUSCULAR | Status: AC
  Administered 2022-07-16: 10 mg via INTRAVENOUS
  Filled 2022-07-16: qty 2

## 2022-07-16 MED ORDER — SODIUM CHLORIDE 0.9 % IV SOLN: INTRAVENOUS | Status: DC

## 2022-07-16 MED ORDER — IPRATROPIUM-ALBUTEROL 0.5-2.5 (3) MG/3ML IN SOLN
3.0000 mL | RESPIRATORY_TRACT | Status: DC | PRN
  Filled 2022-07-16: qty 3

## 2022-07-16 MED ORDER — ONDANSETRON HCL 4 MG/2ML IJ SOLN: 4.0000 mg | Freq: Four times a day (QID) | INTRAMUSCULAR | Status: DC | PRN

## 2022-07-16 MED ORDER — MAGNESIUM SULFATE 2 GM/50ML IV SOLN
2.0000 g | Freq: Once | INTRAVENOUS | Status: AC
  Administered 2022-07-16: 2 g via INTRAVENOUS
  Filled 2022-07-16: qty 50

## 2022-07-16 MED ORDER — ONDANSETRON HCL 4 MG PO TABS: 4.0000 mg | ORAL_TABLET | Freq: Four times a day (QID) | ORAL | Status: DC | PRN

## 2022-07-16 MED ORDER — ACETAMINOPHEN 325 MG PO TABS: 650.0000 mg | ORAL_TABLET | Freq: Four times a day (QID) | ORAL | Status: DC | PRN

## 2022-07-16 MED ORDER — ACETAMINOPHEN 650 MG RE SUPP: 650.0000 mg | Freq: Four times a day (QID) | RECTAL | Status: DC | PRN

## 2022-07-16 MED ORDER — INSULIN ASPART 100 UNIT/ML IJ SOLN
0.0000 [IU] | Freq: Three times a day (TID) | INTRAMUSCULAR | Status: DC
  Administered 2022-07-16: 7 [IU] via SUBCUTANEOUS
  Administered 2022-07-17: 2 [IU] via SUBCUTANEOUS
  Filled 2022-07-16: qty 0.09

## 2022-07-16 MED ORDER — DEXAMETHASONE 0.5 MG PO TABS
1.0000 mg | ORAL_TABLET | Freq: Every day | ORAL | Status: DC
  Administered 2022-07-17: 1 mg via ORAL
  Filled 2022-07-16: qty 2

## 2022-07-16 MED ORDER — SENNOSIDES-DOCUSATE SODIUM 8.6-50 MG PO TABS: 1.0000 | ORAL_TABLET | Freq: Every evening | ORAL | Status: DC | PRN

## 2022-07-16 MED ORDER — DEXAMETHASONE SODIUM PHOSPHATE 4 MG/ML IJ SOLN
3.0000 mg | Freq: Once | INTRAMUSCULAR | Status: AC
  Administered 2022-07-16: 3 mg via INTRAVENOUS
  Filled 2022-07-16: qty 1

## 2022-07-16 MED ORDER — INSULIN GLARGINE-YFGN 100 UNIT/ML ~~LOC~~ SOLN
8.0000 [IU] | Freq: Every day | SUBCUTANEOUS | Status: DC
  Administered 2022-07-16: 8 [IU] via SUBCUTANEOUS
  Filled 2022-07-16 (×2): qty 0.08

## 2022-07-16 MED ORDER — ENOXAPARIN SODIUM 60 MG/0.6ML IJ SOSY
55.0000 mg | PREFILLED_SYRINGE | INTRAMUSCULAR | Status: DC
  Administered 2022-07-16: 55 mg via SUBCUTANEOUS
  Filled 2022-07-16: qty 0.6

## 2022-07-16 MED ORDER — SODIUM CHLORIDE 0.9% FLUSH
10.0000 mL | Freq: Two times a day (BID) | INTRAVENOUS | Status: DC
  Administered 2022-07-16 – 2022-07-17 (×2): 10 mL

## 2022-07-16 MED ORDER — SODIUM CHLORIDE 0.9 % IV SOLN
6.2500 mg | Freq: Once | INTRAVENOUS | Status: DC
  Filled 2022-07-16: qty 0.25

## 2022-07-16 MED ORDER — SODIUM CHLORIDE 0.9% FLUSH: 10.0000 mL | INTRAVENOUS | Status: DC | PRN

## 2022-07-16 MED ORDER — SODIUM CHLORIDE 0.9 % IV SOLN
2.0000 g | INTRAVENOUS | Status: DC
  Administered 2022-07-16: 2 g via INTRAVENOUS
  Filled 2022-07-16: qty 20

## 2022-07-16 MED ORDER — CHLORHEXIDINE GLUCONATE CLOTH 2 % EX PADS
6.0000 | MEDICATED_PAD | Freq: Every day | CUTANEOUS | Status: DC
  Administered 2022-07-17: 6 via TOPICAL

## 2022-07-16 MED ORDER — LACTATED RINGERS IV BOLUS
500.0000 mL | Freq: Once | INTRAVENOUS | Status: AC
  Administered 2022-07-16: 500 mL via INTRAVENOUS

## 2022-07-16 NOTE — H&P (Addendum)
History and Physical    REYES FIFIELD ZOX:[id number] DOB: 24-Nov-1960 DOA: 07/16/2022  I have briefly reviewed the patient's prior medical records in [facility]  PCP: [name], FNP  Patient coming from: home  Chief Complaint: nausea   HPI: BAYLA MCGOVERN is a 62 y.o. female with medical history significant of stage IV lung cancer with brain metastasis on Decadron, steroid-induced diabetes mellitus, obesity, HTN, HLD, who comes to the hospital with complaints of persistent nausea.  This started last night.  Feels like her symptoms are new, and she is never experienced nausea like this before.  She denies any abdominal pain, denies any vomiting but has been having retching.  She took Zofran at home but without effect. Her malignancy was diagnosed in November 2023 and is status post 5 cycles of systemic chemotherapy.  She is seeing Dr. Arbutus Ped as well as Dr. Barbaraann Cao.  She is also status post SRS to brain mets, finished cycle #6 and she is scheduled to start cycle #7 this week.  She reports a fever of 101 last night, but no associated chills.  She denies any chest pain, she denies any shortness of breath but does report a dry cough for the past few days.  No lightheadedness or dizziness.  ED Course: In the ER she is afebrile, tachycardic to low 100s, normotensive.  She is satting well on room air.  Blood work reveals an elevated lipase of 391, and leukocytosis with a white count of 14 K.  Magnesium is 1.4.  Chest x-ray and CT scan of the brain without acute findings.  Right upper quadrant ultrasound showed a large 5 cm gallstone lodged in the gallbladder neck and other smaller stones and sludge within the gallbladder with gallbladder distention.  We are asked to admit for persistent nausea  Review of Systems: All systems reviewed, and apart from HPI, all negative  Past Medical History:  Diagnosis Date   Abscess    hydroadenitis superativa   Anxiety    DM (diabetes mellitus) (HCC)     Hyperinsulinemia    Hypertension 1983   gestational - pre-eclampsia   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[facility])    Neuroma    audiotry neuroma   Seizure (HCC)    Vertigo     Past Surgical History:  Procedure Laterality Date   ABDOMINAL HYSTERECTOMY  2006   remaining left ovary   BRONCHIAL BIOPSY  02/19/2022   Procedure: BRONCHIAL BIOPSIES;  Surgeon: Josephine Igo, DO;  Location: [facility] ENDOSCOPY;  Service: Pulmonary;;   BRONCHIAL BRUSHINGS  02/19/2022   Procedure: BRONCHIAL BRUSHINGS;  Surgeon: Josephine Igo, DO;  Location: [facility];  Service: Pulmonary;;   BRONCHIAL NEEDLE ASPIRATION BIOPSY  02/19/2022   Procedure: BRONCHIAL NEEDLE ASPIRATION BIOPSIES;  Surgeon: Josephine Igo, DO;  Location: [facility] ENDOSCOPY;  Service: Pulmonary;;   IR IMAGING GUIDED PORT INSERTION  03/29/2022   SHOULDER SURGERY Right 1960s     reports that she quit smoking about 5 months ago. Her smoking use included cigarettes. She smoked an average of .75 packs per day. She has never used smokeless tobacco. She reports current alcohol use. She reports that she does not use drugs.  Allergies  Allergen Reactions   Adhesive [Tape] Dermatitis   Statins Other (See Comments)    Myalgia, itching    Family History  Problem Relation Age of Onset   COPD Mother    Heart disease Father    Diabetes Father    Kidney  disease Father    Arthritis Father        RA   Liver disease Father     Prior to Admission medications   Medication Sig Start Date End Date Taking? Authorizing Provider  albuterol (VENTOLIN HFA) 108 (90 Base) MCG/ACT inhaler Inhale 2 puffs into the lungs every 6 (six) hours as needed. 01/08/21   [name], FNP  cephALEXin (KEFLEX) 500 MG capsule Take 1 capsule (500 mg total) by mouth 2 (two) times daily. Patient not taking: Reported on 07/12/2022 06/04/22   [name], FNP  clonazePAM (KLONOPIN) 0.5 MG tablet Take 1/2 tablet (0.25 mg) by mouth at bedtime. For anxiety 07/12/22   Jannifer Rodney  A, FNP  dexamethasone (DECADRON) 1 MG tablet Take 3 tablets (3 mg total) by mouth daily with breakfast. 05/28/22   [name], MD  fluconazole (DIFLUCAN) 150 MG tablet Take 1 tablet (150 mg) by mouth every 3 days as needed. 07/12/22   [name], FNP  folic acid (FOLVITE) 1 MG tablet Take 1 tablet (1 mg total) by mouth daily. 05/26/22   [name], PA-C  furosemide (LASIX) 20 MG tablet Take 1 tablet (20 mg total) by mouth daily as needed. 06/20/22   Heilingoetter, [name], PA-C  HYDROcodone-acetaminophen (NORCO) 5-325 MG tablet Take 1 - 2 tablets by mouth every 6 (six) hours as needed for moderate pain. 05/30/22   [name], FNP  insulin glargine (LANTUS SOLOSTAR) 100 UNIT/ML Solostar Pen Inject 8 Units into the skin daily. 07/09/22   [name], FNP  Insulin Pen Needle (PEN NEEDLES) 32G X 4 MM MISC Use to inject insulin daily as directed. 07/09/22   [name], FNP  levETIRAcetam (KEPPRA) 1000 MG tablet Take 1 tablet (1,000 mg total) by mouth 2 (two) times daily. 05/26/22   [name], PA-C  lidocaine (XYLOCAINE) 5 % ointment Apply topically as needed. 05/30/22   [name], FNP  lidocaine-prilocaine (EMLA) cream Apply to port prior to access 04/25/22   [name], MD  magic mouthwash (nystatin, diphenhydrAMINE, alum & mag hydroxide) suspension mixture Take 5 mLs by mouth 3 (three) times daily as needed for mouth pain. 06/04/22   [name], MD  metFORMIN (GLUCOPHAGE) 1000 MG tablet Take 1 tablet (1,000 mg total) by mouth 2 (two) times daily. 05/10/22   [name], FNP  ondansetron (ZOFRAN) 4 MG tablet Take 1 tablet (4 mg total) by mouth every 6 (six) hours as needed for nausea. 05/30/22   Heilingoetter, [name], PA-C  ondansetron (ZOFRAN) 8 MG tablet Take 1 tablet (8 mg total) by mouth 3 (three) times daily. 05/25/22   Serena Croissant, MD  prochlorperazine (COMPAZINE) 10 MG tablet Take 1 tablet (10 mg total) by mouth every 6 (six) hours as  needed. 05/30/22   Heilingoetter, [name], PA-C    Physical Exam: Vitals:   07/16/22 0841 07/16/22 1100 07/16/22 1200 07/16/22 1241  BP:    129/63  Pulse:  99 91 88  Resp:  (!) 28 (!) 22 19  Temp:    98.4 F (36.9 C)  TempSrc:    Oral  SpO2:  94% 95% 93%  Weight: 110.8 kg     Height: 5\' 8"  (1.727 m)       Constitutional: NAD, calm, comfortable Eyes: PERRL, lids and conjunctivae normal ENMT: Mucous membranes are moist.  Neck: normal, supple Respiratory: clear to auscultation bilaterally, no wheezing, no crackles. Normal respiratory effort.  Cardiovascular:  Regular rate and rhythm, no murmurs / rubs / gallops 1+ edema bilateral lower extremities Abdomen: Mild tenderness to right upper quadrant, no guarding or rebound Musculoskeletal: no clubbing / cyanosis. Normal muscle tone.  Skin: Faint rash on her face Neurologic: Nonfocal  Labs on Admission: I have personally reviewed following labs and imaging studies  CBC: Recent Labs  Lab 07/16/22 0904  WBC 14.0*  NEUTROABS 11.4*  HGB 12.6  HCT 39.2  MCV 102.6*  PLT 185   Basic Metabolic Panel: Recent Labs  Lab 07/12/22 1114 07/16/22 0904  NA 141 139  K 3.7 3.6  CL 101 103  CO2 18* 23  GLUCOSE 289* 253*  BUN 11 9  CREATININE 0.67 0.76  CALCIUM 9.2 9.2  MG  --  1.4*   Liver Function Tests: Recent Labs  Lab 07/12/22 1114 07/16/22 0904  AST 32 24  ALT 84* 56*  ALKPHOS 71 57  BILITOT 0.4 0.7  PROT 5.9* 6.5  ALBUMIN 3.8* 3.3*   Coagulation Profile: No results for input(s): "INR", "PROTIME" in the last 168 hours. BNP (last 3 results) No results for input(s): "PROBNP" in the last 8760 hours. CBG: No results for input(s): "GLUCAP" in the last 168 hours. Thyroid Function Tests: No results for input(s): "TSH", "T4TOTAL", "FREET4", "T3FREE", "THYROIDAB" in the last 72 hours. Urine analysis:    Component Value Date/Time   COLORURINE AMBER (A) 05/26/2022 1502   APPEARANCEUR CLOUDY (A) 05/26/2022 1502    LABSPEC 1.023 05/26/2022 1502   PHURINE 5.0 05/26/2022 1502   GLUCOSEU 50 (A) 05/26/2022 1502   HGBUR NEGATIVE 05/26/2022 1502   BILIRUBINUR NEGATIVE 05/26/2022 1502   KETONESUR 5 (A) 05/26/2022 1502   PROTEINUR 30 (A) 05/26/2022 1502   UROBILINOGEN 0.2 04/07/2011 2356   NITRITE NEGATIVE 05/26/2022 1502   LEUKOCYTESUR TRACE (A) 05/26/2022 1502     Radiological Exams on Admission: CT Head Wo Contrast  Result Date: 07/16/2022 CLINICAL DATA:  Headache EXAM: CT HEAD WITHOUT CONTRAST TECHNIQUE: Contiguous axial images were obtained from the base of the skull through the vertex without intravenous contrast. RADIATION DOSE REDUCTION: This exam was performed according to the departmental dose-optimization program which includes automated exposure control, adjustment of the mA and/or kV according to patient size and/or use of iterative reconstruction technique. COMPARISON:  Brain MRI 06/06/2022. FINDINGS: Brain: There is no evidence of acute intracranial hemorrhage, extra-axial fluid collection, or acute infarct Hypodensity in the left precentral gyrus corresponds to the known metastatic lesion and surrounding vasogenic edema, grossly similar to the prior MRI from 06/06/2022, allowing for differences in modality. There is no significant regional mass effect. The smaller lesion in the right parietal cortex near the vertex is not well seen on the current study. Parenchymal volume is normal. The ventricles are stable and normal in size. The pituitary and suprasellar region are normal. There is no midline shift. The known right IAC vestibular schwannoma is not seen on the current study. Vascular: No hyperdense vessel or unexpected calcification. Skull: Normal. Negative for fracture or focal lesion. Sinuses/Orbits: The imaged paranasal sinuses are clear. The globes and orbits are unremarkable. Other: None. IMPRESSION: 1. No acute intracranial pathology. 2. Hypodensity in the left precentral gyrus corresponding to  the known metastatic lesion and surrounding vasogenic edema, grossly similar to the prior MRI from 06/05/2022, allowing for differences in modality. Electronically Signed   By: Lesia Hausen M.D.   On: 07/16/2022 11:57   CT Chest W Contrast  Result Date: 07/16/2022 CLINICAL DATA:  Restaging non-small  cell lung cancer * Tracking Code: BO * EXAM: CT CHEST, ABDOMEN, AND PELVIS WITH CONTRAST TECHNIQUE: Multidetector CT imaging of the chest, abdomen and pelvis was performed following the standard protocol during bolus administration of intravenous contrast. RADIATION DOSE REDUCTION: This exam was performed according to the departmental dose-optimization program which includes automated exposure control, adjustment of the mA and/or kV according to patient size and/or use of iterative reconstruction technique. CONTRAST:  OMNIPAQUE IOHEXOL 300 MG/ML  SOLN COMPARISON:  Multiple exams, including 05/13/2022 FINDINGS: CT CHEST FINDINGS Cardiovascular: Right Port-A-Cath tip: SVC. Coronary, aortic arch, and branch vessel atherosclerotic vascular disease. Mediastinum/Nodes: Mild and mildly worsened thoracic adenopathy. Left paratracheal node 1.2 cm in short axis on image 25 series 504, previously 0.9 cm. Right hilar node 1.1 cm in short axis on image 29 series 504, previously 0.8 cm. Two adjacent infrahilar nodes on the right side are mildly enlarged at 1.0 cm in diameter each on image 35 series 500 for, previously 0.9 cm. Lungs/Pleura: Left lower lobe mass 6.4 by 4.1 by 5.4 cm (volume = 74 cm^3), previously 5.5 by 4.0 by 4.7 cm (volume = 54 cm^3). This mass abuts the medial pleural margin and there is a trace pleural effusion 1, nonspecific for malignancy. There are few scattered bilateral pulmonary nodules which are stable to mildly increased. For example a superior segment right lower lobe nodule on image 63 series 507 measures 9 by 8 by 7 mm, and previously measured 8 by 7 by 6 mm. No definite new nodules identified.  These nodules likely reflect metastatic disease. Musculoskeletal: Thoracic spondylosis. No compelling findings of thoracic skeletal metastatic disease. CT ABDOMEN PELVIS FINDINGS Hepatobiliary: Hypodensity compatible with benign steatosis in segment 4 of the liver posteriorly. This warrants no further workup. There is also some steatosis medially in segment 4b of the liver adjacent to the falciform ligament. Large gallstone in the gallbladder measuring up to 4.8 cm. Distal to this stone within the gallbladder there is diffuse homogeneous accentuated density probably reflecting concentrated sludge distal to the stone; bile proximal to the stone is of normal fluid density. Pancreas: Better seen than on prior exams due to loss motion artifact today, there is a 6 mm fluid density lesion in the pancreatic head on image 74 of series 500 for which is probably a tiny cystic lesion. Possibilities include a small postinflammatory cystic lesion or tiny indolent intraductal papillary mucinous neoplasm. Typical guidelines would call for follow up pancreatic protocol MRI with and without contrast in 1 years time. However, in this clinical context, satisfactory surveillance imaging can probably be achieved with anticipated follow up oncology CT imaging of the abdomen related to the patient's metastatic thoracic malignancy. Spleen: Unremarkable Adrenals/Urinary Tract: Unremarkable Stomach/Bowel: Unremarkable Vascular/Lymphatic: Atherosclerosis is present, including aortoiliac atherosclerotic disease. Aortocaval lymph node 0.7 cm in short axis on image 87 series 504, within normal limits. No overtly pathologic adenopathy is identified in the abdomen or pelvis. Reproductive: Uterus absent.  Adnexa unremarkable. Other: Low-level subcutaneous edema particularly laterally and posteriorly in the hip region. Musculoskeletal: Bridging degenerative facet spurring at L5-S1 especially on the right. Lumbar spondylosis and degenerative disc  disease contribute to impingement most notably at L3-4, L4-5, and L5-S1. Low position of the anorectal junction compatible with pelvic floor laxity. Sclerotic lesion with lobulated borders the lateral margin of the left femoral head and neck on image 94 series 505, no change from 02/12/2022, favor benign etiology such as bone island or densely calcified enchondroma over metastatic lesion. IMPRESSION: 1. The  dominant left lower lobe mass has a volume of 74 cubic cm, previously 54 cubic cm. This mass abuts the medial pleural margin and there is a trace left pleural effusion. 2. Scattered bilateral pulmonary nodules are stable to mildly increased in size, compatible with metastatic disease. 3. Mild and mildly worsened right hilar and left paratracheal adenopathy. 4. No findings of metastatic disease to the abdomen/pelvis or skeleton. 5. 6 mm fluid density lesion in the pancreatic head, probably a tiny cystic lesion. Possibilities include a small postinflammatory cystic lesion or tiny indolent intraductal papillary mucinous neoplasm. Typical guidelines would call for follow up pancreatic protocol MRI with and without contrast in 1 years time. However, in this clinical context, satisfactory surveillance imaging can probably be achieved with anticipated follow up oncology CT imaging of the abdomen related to the patient's metastatic thoracic malignancy. 6. Other imaging findings of potential clinical significance: Coronary, aortic arch, and branch vessel atherosclerotic vascular disease. Cholelithiasis. Low position of the anorectal junction compatible with pelvic floor laxity. Lumbar spondylosis and degenerative disc disease causing impingement most notably at L3-4, L4-5, and L5-S1. 7. Low-level subcutaneous edema laterally and posteriorly in the hip region. 8. Aortic atherosclerosis. Aortic Atherosclerosis (ICD10-I70.0). Electronically Signed   By: Gaylyn Rong M.D.   On: 07/16/2022 11:33   CT Abdomen Pelvis W  Contrast  Result Date: 07/16/2022 CLINICAL DATA:  Restaging non-small cell lung cancer * Tracking Code: BO * EXAM: CT CHEST, ABDOMEN, AND PELVIS WITH CONTRAST TECHNIQUE: Multidetector CT imaging of the chest, abdomen and pelvis was performed following the standard protocol during bolus administration of intravenous contrast. RADIATION DOSE REDUCTION: This exam was performed according to the departmental dose-optimization program which includes automated exposure control, adjustment of the mA and/or kV according to patient size and/or use of iterative reconstruction technique. CONTRAST:  OMNIPAQUE IOHEXOL 300 MG/ML  SOLN COMPARISON:  Multiple exams, including 05/13/2022 FINDINGS: CT CHEST FINDINGS Cardiovascular: Right Port-A-Cath tip: SVC. Coronary, aortic arch, and branch vessel atherosclerotic vascular disease. Mediastinum/Nodes: Mild and mildly worsened thoracic adenopathy. Left paratracheal node 1.2 cm in short axis on image 25 series 504, previously 0.9 cm. Right hilar node 1.1 cm in short axis on image 29 series 504, previously 0.8 cm. Two adjacent infrahilar nodes on the right side are mildly enlarged at 1.0 cm in diameter each on image 35 series 500 for, previously 0.9 cm. Lungs/Pleura: Left lower lobe mass 6.4 by 4.1 by 5.4 cm (volume = 74 cm^3), previously 5.5 by 4.0 by 4.7 cm (volume = 54 cm^3). This mass abuts the medial pleural margin and there is a trace pleural effusion 1, nonspecific for malignancy. There are few scattered bilateral pulmonary nodules which are stable to mildly increased. For example a superior segment right lower lobe nodule on image 63 series 507 measures 9 by 8 by 7 mm, and previously measured 8 by 7 by 6 mm. No definite new nodules identified. These nodules likely reflect metastatic disease. Musculoskeletal: Thoracic spondylosis. No compelling findings of thoracic skeletal metastatic disease. CT ABDOMEN PELVIS FINDINGS Hepatobiliary: Hypodensity compatible with benign  steatosis in segment 4 of the liver posteriorly. This warrants no further workup. There is also some steatosis medially in segment 4b of the liver adjacent to the falciform ligament. Large gallstone in the gallbladder measuring up to 4.8 cm. Distal to this stone within the gallbladder there is diffuse homogeneous accentuated density probably reflecting concentrated sludge distal to the stone; bile proximal to the stone is of normal fluid density. Pancreas: Better seen  than on prior exams due to loss motion artifact today, there is a 6 mm fluid density lesion in the pancreatic head on image 74 of series 500 for which is probably a tiny cystic lesion. Possibilities include a small postinflammatory cystic lesion or tiny indolent intraductal papillary mucinous neoplasm. Typical guidelines would call for follow up pancreatic protocol MRI with and without contrast in 1 years time. However, in this clinical context, satisfactory surveillance imaging can probably be achieved with anticipated follow up oncology CT imaging of the abdomen related to the patient's metastatic thoracic malignancy. Spleen: Unremarkable Adrenals/Urinary Tract: Unremarkable Stomach/Bowel: Unremarkable Vascular/Lymphatic: Atherosclerosis is present, including aortoiliac atherosclerotic disease. Aortocaval lymph node 0.7 cm in short axis on image 87 series 504, within normal limits. No overtly pathologic adenopathy is identified in the abdomen or pelvis. Reproductive: Uterus absent.  Adnexa unremarkable. Other: Low-level subcutaneous edema particularly laterally and posteriorly in the hip region. Musculoskeletal: Bridging degenerative facet spurring at L5-S1 especially on the right. Lumbar spondylosis and degenerative disc disease contribute to impingement most notably at L3-4, L4-5, and L5-S1. Low position of the anorectal junction compatible with pelvic floor laxity. Sclerotic lesion with lobulated borders the lateral margin of the left femoral head  and neck on image 94 series 505, no change from 02/12/2022, favor benign etiology such as bone island or densely calcified enchondroma over metastatic lesion. IMPRESSION: 1. The dominant left lower lobe mass has a volume of 74 cubic cm, previously 54 cubic cm. This mass abuts the medial pleural margin and there is a trace left pleural effusion. 2. Scattered bilateral pulmonary nodules are stable to mildly increased in size, compatible with metastatic disease. 3. Mild and mildly worsened right hilar and left paratracheal adenopathy. 4. No findings of metastatic disease to the abdomen/pelvis or skeleton. 5. 6 mm fluid density lesion in the pancreatic head, probably a tiny cystic lesion. Possibilities include a small postinflammatory cystic lesion or tiny indolent intraductal papillary mucinous neoplasm. Typical guidelines would call for follow up pancreatic protocol MRI with and without contrast in 1 years time. However, in this clinical context, satisfactory surveillance imaging can probably be achieved with anticipated follow up oncology CT imaging of the abdomen related to the patient's metastatic thoracic malignancy. 6. Other imaging findings of potential clinical significance: Coronary, aortic arch, and branch vessel atherosclerotic vascular disease. Cholelithiasis. Low position of the anorectal junction compatible with pelvic floor laxity. Lumbar spondylosis and degenerative disc disease causing impingement most notably at L3-4, L4-5, and L5-S1. 7. Low-level subcutaneous edema laterally and posteriorly in the hip region. 8. Aortic atherosclerosis. Aortic Atherosclerosis (ICD10-I70.0). Electronically Signed   By: Gaylyn Rong M.D.   On: 07/16/2022 11:33   US Abdomen Limited RUQ (LIVER/GB)  Result Date: 07/16/2022 CLINICAL DATA:  Right upper quadrant pain EXAM: ULTRASOUND ABDOMEN LIMITED RIGHT UPPER QUADRANT COMPARISON:  CT 07/15/2022 FINDINGS: Gallbladder: Gallbladder is distended. 5 cm gallstone lodged  in the gallbladder neck. Small stones and sludge also seen within the gallbladder. No wall thickening or sonographic Murphy sign. Common bile duct: Diameter: Normal caliber, 4 mm Liver: Coarsened, increased echotexture compatible with fatty infiltration. No focal abnormality. Portal vein is patent on color Doppler imaging with normal direction of blood flow towards the liver. Other: None. IMPRESSION: Large 5 cm gallstone lodged in the gallbladder neck. Other smaller stones and sludge within the gallbladder with gallbladder distension. No wall thickening or sonographic Murphy sign. Fatty liver. Electronically Signed   By: Charlett Nose M.D.   On: 07/16/2022 09:27  DG Chest Portable 1 View  Result Date: 07/16/2022 CLINICAL DATA:  Abdominal pain with nausea and vomiting. History of metastatic lung cancer. EXAM: PORTABLE CHEST 1 VIEW COMPARISON:  CT chest from yesterday. FINDINGS: Unchanged right chest wall port catheter. The heart size and mediastinal contours are within normal limits. Normal pulmonary vascularity. Small metastases in both lungs are not apparent by x-ray. No focal consolidation, pleural effusion, or pneumothorax. No acute osseous abnormality. IMPRESSION: 1. No active disease. Small bilateral pulmonary metastases are not apparent by x-ray. Electronically Signed   By: Obie Dredge M.D.   On: 07/16/2022 09:02    EKG: Independently reviewed.  Sinus tachycardia  Assessment/Plan Principal problem Cholelithiasis, nausea -possibly related, however she does not have significant tenderness on right upper quadrant examination. Negative Murphy's sounds with ultrasound also.  She does have leukocytosis, I have asked for general surgery consultation to evaluate.  Appreciate input.  LFTs minimally elevated with ALT of 56, otherwise bilirubin and AST are normal. -Cover with antibiotics, surgery consulted, appreciate input  Active problems Steroid-induced diabetes mellitus, with hyperglycemia -last  C8.2.  She was recently seen by her primary care doctor, started on long-acting insulin.  Continue that here along with sliding scale  Lab Results  Component Value Date   HGBA1C 8.2 (H) 07/02/2022   Stage IV lung cancer with brain mets -scheduled to get cycle #7 of chemotherapy this week.  Patient states that she has tapered her decadron to 1 mg per day. Received IV in the ER, next dose tomorrow -she is also on Keppra, continue  Hypomagnesemia -magnesium was repleted in the ER, repeat in the morning  Elevated lipase -no significant epigastric abdominal tenderness.  CT scan of the abdomen pelvis with contrast done yesterday showed small 6 mm fluid density in the pancreatic head, probably a cystic lesion.  Unlikely that this contributes to elevated lipase.  Another possibility would be that she has passed a gallbladder stone causing transient pancreatic inflammation, however I would expect to see the LFTs being higher.  Leukocytosis -antibiotics as per #1  Obesity, class II -BMI 37, she would benefit from weight loss  Lower extremity edema -per patient she does have lower extremity swelling, suspect in the setting of steroid use/obesity rather than true heart failure. Resume home Lasix once no longer NPO and she can eat  DVT prophylaxis: Lovenox  Code Status: Full code per my discussion with the patient  Family Communication: no family at bedside  Disposition Plan: home when ready  Bed Type: MedSurg Consults called: General surgery   Obs/Inp: Observation  Pamella Pert, MD, PhD [facility]  Contact via [email or web address]  07/16/2022, 1:15 PM

## 2022-07-16 NOTE — ED Triage Notes (Signed)
BIB EMS with N/V abd pain, pt has Brain mets and is scheduled for chemo tomorrow.

## 2022-07-16 NOTE — Consult Note (Addendum)
Consult Note  Carolyn Hooper 23-Feb-1961  [id number].    Requesting MD: Dr. Durwin Nora Chief Complaint/Reason for Consult: cholelithiasis  HPI:  62 y.o. female with medical history significant for DM, anxiety, seizures, lung cancer metastatic to brain (currently undergoing treatment per Dr. Arbutus Ped) who presented to [facility] ED with nausea which began last night and has persisted despite taking zofran at home. She called her oncology office who recommended she present to the ED. She has also had recent cough and headache as well as one temperature of 101, which has not recurred.  She denies any abdominal pain.  She has never had nausea like this previously.  She denies any issues with eating.  She denies any postprandial nausea, vomiting, or abdominal pain. She had an outpatient staging CT scan yesterday which revealed an increase in size of her dominant lung mass and lung mets.  She has a small 6mm fluid density on her pancreatic head, but that is to be followed with further staging imaging.  Her nausea started after these scans were done.  Work up in ED shows lipase of 391, WBC 14.0. US shows 5 cm lodged in the gallbladder neck - no wall thickening or sonographic murphy sign.  Substance use: former cigarette smoker Allergies: statins, adhesives Blood thinners: none Past Surgeries: abdominal hysterectomy    ROS: ROS reviewed, as per HPI  Family History  Problem Relation Age of Onset   COPD Mother    Heart disease Father    Diabetes Father    Kidney disease Father    Arthritis Father        RA   Liver disease Father     Past Medical History:  Diagnosis Date   Abscess    hydroadenitis superativa   Anxiety    DM (diabetes mellitus) (HCC)    Hyperinsulinemia    Hypertension 1983   gestational - pre-eclampsia   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[facility])    Neuroma    audiotry neuroma   Seizure (HCC)    Vertigo     Past Surgical History:  Procedure Laterality Date   ABDOMINAL  HYSTERECTOMY  2006   remaining left ovary   BRONCHIAL BIOPSY  02/19/2022   Procedure: BRONCHIAL BIOPSIES;  Surgeon: Josephine Igo, DO;  Location: [facility] ENDOSCOPY;  Service: Pulmonary;;   BRONCHIAL BRUSHINGS  02/19/2022   Procedure: BRONCHIAL BRUSHINGS;  Surgeon: Josephine Igo, DO;  Location: [facility] ENDOS[facility];  Service: Pulmonary;;   BRONCHIAL NEEDLE ASPIRATION BIOPSY  02/19/2022   Procedure: BRONCHIAL NEEDLE ASPIRATION BIOPSIES;  Surgeon: Josephine Igo, DO;  Location: [facility] ENDOSCOPY;  Service: Pulmonary;;   IR IMAGING GUIDED PORT INSERTION  03/29/2022   SHOULDER SURGERY Right 1960s    Social History:  reports that she quit smoking about 5 months ago. Her smoking use included cigarettes. She smoked an average of .75 packs per day. She has never used smokeless tobacco. She reports current alcohol use. She reports that she does not use drugs.  Allergies:  Allergies  Allergen Reactions   Adhesive [Tape] Dermatitis   Statins Other (See Comments)    Myalgia, itching    (Not in a hospital admission)   Blood pressure 129/63, pulse 88, temperature 98.4 F (36.9 C), temperature source Oral, resp. rate 19, height 5\' 8"  (1.727 m), weight 110.8 kg, SpO2 93 %. Physical Exam: General: pleasant, WD, obese female who is laying in bed in NAD HEENT: head is normocephalic, atraumatic.  Sclera are noninjected.  Pupils  equal and round. EOMs intact.  Ears and nose without any masses or lesions.  Mouth is pink and moist Heart: regular, rate, and rhythm.  Normal s1,s2. No obvious murmurs, gallops, or rubs noted.  Palpable radial and pedal pulses bilaterally Lungs: CTAB, no wheezes, rhonchi, or rales noted.  Respiratory effort nonlabored. PAC present in RUC. Abd: soft, NT, ND, +BS, no masses, hernias, or organomegaly Psych: A&Ox3 with an appropriate affect.    Results for orders placed or performed during the hospital encounter of 07/16/22 (from the past 48 hour(s))  Comprehensive metabolic panel     Status:  Abnormal   Collection Time: 07/16/22  9:04 AM  Result Value Ref Range   Sodium 139 135 - 145 mmol/L   Potassium 3.6 3.5 - 5.1 mmol/L   Chloride 103 98 - 111 mmol/L   CO2 23 22 - 32 mmol/L   Glucose, Bld 253 (H) 70 - 99 mg/dL    Comment: Glucose reference range applies only to samples taken after fasting for at least 8 hours.   BUN 9 8 - 23 mg/dL   Creatinine, Ser 1.61 0.44 - 1.00 mg/dL   Calcium 9.2 8.9 - 09.6 mg/dL   Total Protein 6.5 6.5 - 8.1 g/dL   Albumin 3.3 (L) 3.5 - 5.0 g/dL   AST 24 15 - 41 U/L   ALT 56 (H) 0 - 44 U/L   Alkaline Phosphatase 57 38 - 126 U/L   Total Bilirubin 0.7 0.3 - 1.2 mg/dL   GFR, Estimated >04 >54 mL/min    Comment: (NOTE) Calculated using the CKD-EPI Creatinine Equation (2021)    Anion gap 13 5 - 15    Comment: Performed at [facility], [address], [place], [place] [address]  Lipase, blood     Status: Abnormal   Collection Time: 07/16/22  9:04 AM  Result Value Ref Range   Lipase 391 (H) 11 - 51 U/L    Comment: Performed at [facility], [address] [place], [place] [address]  CBC with Diff     Status: Abnormal   Collection Time: 07/16/22  9:04 AM  Result Value Ref Range   WBC 14.0 (H) 4.0 - 10.5 K/uL   RBC 3.82 (L) 3.87 - 5.11 MIL/uL   Hemoglobin 12.6 12.0 - 15.0 g/dL   HCT 29.5 62.1 - 30.8 %   MCV 102.6 (H) 80.0 - 100.0 fL   MCH 33.0 26.0 - 34.0 pg   MCHC 32.1 30.0 - 36.0 g/dL   RDW 65.7 84.6 - 96.2 %   Platelets 185 150 - 400 K/uL   nRBC 0.0 0.0 - 0.2 %   Neutrophils Relative % 82 %   Neutro Abs 11.4 (H) 1.7 - 7.7 K/uL   Lymphocytes Relative 4 %   Lymphs Abs 0.6 (L) 0.7 - 4.0 K/uL   Monocytes Relative 8 %   Monocytes Absolute 1.1 (H) 0.1 - 1.0 K/uL   Eosinophils Relative 6 %   Eosinophils Absolute 0.8 (H) 0.0 - 0.5 K/uL   Basophils Relative 0 %   Basophils Absolute 0.1 0.0 - 0.1 K/uL   Immature Granulocytes 0 %   Abs Immature Granulocytes 0.06 0.00 - 0.07 K/uL    Comment: Performed at  [facility], [address] [place], [place] [address]  Magnesium     Status: Abnormal   Collection Time: 07/16/22  9:04 AM  Result Value Ref Range   Magnesium 1.4 (L) 1.7 - 2.4 mg/dL  Comment: Performed at [facility], [address] [place], [place] [address]  Resp panel by RT-PCR (RSV, Flu A&B, Covid) Anterior Nasal Swab     Status: None   Collection Time: 07/16/22  9:04 AM   Specimen: Anterior Nasal Swab  Result Value Ref Range   SARS Coronavirus 2 by RT PCR NEGATIVE NEGATIVE    Comment: (NOTE) SARS-CoV-2 target nucleic acids are NOT DETECTED.  The SARS-CoV-2 RNA is generally detectable in upper respiratory specimens during the acute phase of infection. The lowest concentration of SARS-CoV-2 viral copies this assay can detect is 138 copies/mL. A negative result does not preclude SARS-Cov-2 infection and should not be used as the sole basis for treatment or other patient management decisions. A negative result may occur with  improper specimen collection/handling, submission of specimen other than nasopharyngeal swab, presence of viral mutation(s) within the areas targeted by this assay, and inadequate number of viral copies(<138 copies/mL). A negative result must be combined with clinical observations, patient history, and epidemiological information. The expected result is Negative.  Fact Sheet for Patients:  BloggerCourse.com  Fact Sheet for Healthcare Providers:  SeriousBroker.it  This test is no t yet approved or cleared by the [place] FDA and  has been authorized for detection and/or diagnosis of SARS-CoV-2 by FDA under an Emergency Use Authorization (EUA). This EUA will remain  in effect (meaning this test can be used) for the duration of the COVID-19 declaration under Section 564(b)(1) of the Act, 21 U.S.C.section 360bbb-3(b)(1), unless the authorization is terminated   or revoked sooner.       Influenza A by PCR NEGATIVE NEGATIVE   Influenza B by PCR NEGATIVE NEGATIVE    Comment: (NOTE) The Xpert Xpress SARS-CoV-2/FLU/RSV plus assay is intended as an aid in the diagnosis of influenza from Nasopharyngeal swab specimens and should not be used as a sole basis for treatment. Nasal washings and aspirates are unacceptable for Xpert Xpress SARS-CoV-2/FLU/RSV testing.  Fact Sheet for Patients: BloggerCourse.com  Fact Sheet for Healthcare Providers: SeriousBroker.it  This test is not yet approved or cleared by the [place] FDA and has been authorized for detection and/or diagnosis of SARS-CoV-2 by FDA under an Emergency Use Authorization (EUA). This EUA will remain in effect (meaning this test can be used) for the duration of the COVID-19 declaration under Section 564(b)(1) of the Act, 21 U.S.C. section 360bbb-3(b)(1), unless the authorization is terminated or revoked.     Resp Syncytial Virus by PCR NEGATIVE NEGATIVE    Comment: (NOTE) Fact Sheet for Patients: BloggerCourse.com  Fact Sheet for Healthcare Providers: SeriousBroker.it  This test is not yet approved or cleared by the [place] FDA and has been authorized for detection and/or diagnosis of SARS-CoV-2 by FDA under an Emergency Use Authorization (EUA). This EUA will remain in effect (meaning this test can be used) for the duration of the COVID-19 declaration under Section 564(b)(1) of the Act, 21 U.S.C. section 360bbb-3(b)(1), unless the authorization is terminated or revoked.  Performed at [facility], [address] [place], [place] [address]    CT Head Wo Contrast  Result Date: 07/16/2022 CLINICAL DATA:  Headache EXAM: CT HEAD WITHOUT CONTRAST TECHNIQUE: Contiguous axial images were obtained from the base of the skull through the vertex without  intravenous contrast. RADIATION DOSE REDUCTION: This exam was performed according to the departmental dose-optimization program which includes automated exposure control, adjustment of the mA and/or kV according to patient size and/or use of iterative reconstruction  technique. COMPARISON:  Brain MRI 06/06/2022. FINDINGS: Brain: There is no evidence of acute intracranial hemorrhage, extra-axial fluid collection, or acute infarct Hypodensity in the left precentral gyrus corresponds to the known metastatic lesion and surrounding vasogenic edema, grossly similar to the prior MRI from 06/06/2022, allowing for differences in modality. There is no significant regional mass effect. The smaller lesion in the right parietal cortex near the vertex is not well seen on the current study. Parenchymal volume is normal. The ventricles are stable and normal in size. The pituitary and suprasellar region are normal. There is no midline shift. The known right IAC vestibular schwannoma is not seen on the current study. Vascular: No hyperdense vessel or unexpected calcification. Skull: Normal. Negative for fracture or focal lesion. Sinuses/Orbits: The imaged paranasal sinuses are clear. The globes and orbits are unremarkable. Other: None. IMPRESSION: 1. No acute intracranial pathology. 2. Hypodensity in the left precentral gyrus corresponding to the known metastatic lesion and surrounding vasogenic edema, grossly similar to the prior MRI from 06/05/2022, allowing for differences in modality. Electronically Signed   By: Lesia Hausen M.D.   On: 07/16/2022 11:57   CT Chest W Contrast  Result Date: 07/16/2022 CLINICAL DATA:  Restaging non-small cell lung cancer * Tracking Code: BO * EXAM: CT CHEST, ABDOMEN, AND PELVIS WITH CONTRAST TECHNIQUE: Multidetector CT imaging of the chest, abdomen and pelvis was performed following the standard protocol during bolus administration of intravenous contrast. RADIATION DOSE REDUCTION: This exam was  performed according to the departmental dose-optimization program which includes automated exposure control, adjustment of the mA and/or kV according to patient size and/or use of iterative reconstruction technique. CONTRAST:  OMNIPAQUE IOHEXOL 300 MG/ML  SOLN COMPARISON:  Multiple exams, including 05/13/2022 FINDINGS: CT CHEST FINDINGS Cardiovascular: Right Port-A-Cath tip: SVC. Coronary, aortic arch, and branch vessel atherosclerotic vascular disease. Mediastinum/Nodes: Mild and mildly worsened thoracic adenopathy. Left paratracheal node 1.2 cm in short axis on image 25 series 504, previously 0.9 cm. Right hilar node 1.1 cm in short axis on image 29 series 504, previously 0.8 cm. Two adjacent infrahilar nodes on the right side are mildly enlarged at 1.0 cm in diameter each on image 35 series 500 for, previously 0.9 cm. Lungs/Pleura: Left lower lobe mass 6.4 by 4.1 by 5.4 cm (volume = 74 cm^3), previously 5.5 by 4.0 by 4.7 cm (volume = 54 cm^3). This mass abuts the medial pleural margin and there is a trace pleural effusion 1, nonspecific for malignancy. There are few scattered bilateral pulmonary nodules which are stable to mildly increased. For example a superior segment right lower lobe nodule on image 63 series 507 measures 9 by 8 by 7 mm, and previously measured 8 by 7 by 6 mm. No definite new nodules identified. These nodules likely reflect metastatic disease. Musculoskeletal: Thoracic spondylosis. No compelling findings of thoracic skeletal metastatic disease. CT ABDOMEN PELVIS FINDINGS Hepatobiliary: Hypodensity compatible with benign steatosis in segment 4 of the liver posteriorly. This warrants no further workup. There is also some steatosis medially in segment 4b of the liver adjacent to the falciform ligament. Large gallstone in the gallbladder measuring up to 4.8 cm. Distal to this stone within the gallbladder there is diffuse homogeneous accentuated density probably reflecting concentrated  sludge distal to the stone; bile proximal to the stone is of normal fluid density. Pancreas: Better seen than on prior exams due to loss motion artifact today, there is a 6 mm fluid density lesion in the pancreatic head on image 74 of series  500 for which is probably a tiny cystic lesion. Possibilities include a small postinflammatory cystic lesion or tiny indolent intraductal papillary mucinous neoplasm. Typical guidelines would call for follow up pancreatic protocol MRI with and without contrast in 1 years time. However, in this clinical context, satisfactory surveillance imaging can probably be achieved with anticipated follow up oncology CT imaging of the abdomen related to the patient's metastatic thoracic malignancy. Spleen: Unremarkable Adrenals/Urinary Tract: Unremarkable Stomach/Bowel: Unremarkable Vascular/Lymphatic: Atherosclerosis is present, including aortoiliac atherosclerotic disease. Aortocaval lymph node 0.7 cm in short axis on image 87 series 504, within normal limits. No overtly pathologic adenopathy is identified in the abdomen or pelvis. Reproductive: Uterus absent.  Adnexa unremarkable. Other: Low-level subcutaneous edema particularly laterally and posteriorly in the hip region. Musculoskeletal: Bridging degenerative facet spurring at L5-S1 especially on the right. Lumbar spondylosis and degenerative disc disease contribute to impingement most notably at L3-4, L4-5, and L5-S1. Low position of the anorectal junction compatible with pelvic floor laxity. Sclerotic lesion with lobulated borders the lateral margin of the left femoral head and neck on image 94 series 505, no change from 02/12/2022, favor benign etiology such as bone island or densely calcified enchondroma over metastatic lesion. IMPRESSION: 1. The dominant left lower lobe mass has a volume of 74 cubic cm, previously 54 cubic cm. This mass abuts the medial pleural margin and there is a trace left pleural effusion. 2. Scattered  bilateral pulmonary nodules are stable to mildly increased in size, compatible with metastatic disease. 3. Mild and mildly worsened right hilar and left paratracheal adenopathy. 4. No findings of metastatic disease to the abdomen/pelvis or skeleton. 5. 6 mm fluid density lesion in the pancreatic head, probably a tiny cystic lesion. Possibilities include a small postinflammatory cystic lesion or tiny indolent intraductal papillary mucinous neoplasm. Typical guidelines would call for follow up pancreatic protocol MRI with and without contrast in 1 years time. However, in this clinical context, satisfactory surveillance imaging can probably be achieved with anticipated follow up oncology CT imaging of the abdomen related to the patient's metastatic thoracic malignancy. 6. Other imaging findings of potential clinical significance: Coronary, aortic arch, and branch vessel atherosclerotic vascular disease. Cholelithiasis. Low position of the anorectal junction compatible with pelvic floor laxity. Lumbar spondylosis and degenerative disc disease causing impingement most notably at L3-4, L4-5, and L5-S1. 7. Low-level subcutaneous edema laterally and posteriorly in the hip region. 8. Aortic atherosclerosis. Aortic Atherosclerosis (ICD10-I70.0). Electronically Signed   By: Gaylyn Rong M.D.   On: 07/16/2022 11:33   CT Abdomen Pelvis W Contrast  Result Date: 07/16/2022 CLINICAL DATA:  Restaging non-small cell lung cancer * Tracking Code: BO * EXAM: CT CHEST, ABDOMEN, AND PELVIS WITH CONTRAST TECHNIQUE: Multidetector CT imaging of the chest, abdomen and pelvis was performed following the standard protocol during bolus administration of intravenous contrast. RADIATION DOSE REDUCTION: This exam was performed according to the departmental dose-optimization program which includes automated exposure control, adjustment of the mA and/or kV according to patient size and/or use of iterative reconstruction technique. CONTRAST:   OMNIPAQUE IOHEXOL 300 MG/ML  SOLN COMPARISON:  Multiple exams, including 05/13/2022 FINDINGS: CT CHEST FINDINGS Cardiovascular: Right Port-A-Cath tip: SVC. Coronary, aortic arch, and branch vessel atherosclerotic vascular disease. Mediastinum/Nodes: Mild and mildly worsened thoracic adenopathy. Left paratracheal node 1.2 cm in short axis on image 25 series 504, previously 0.9 cm. Right hilar node 1.1 cm in short axis on image 29 series 504, previously 0.8 cm. Two adjacent infrahilar nodes on the right side are  mildly enlarged at 1.0 cm in diameter each on image 35 series 500 for, previously 0.9 cm. Lungs/Pleura: Left lower lobe mass 6.4 by 4.1 by 5.4 cm (volume = 74 cm^3), previously 5.5 by 4.0 by 4.7 cm (volume = 54 cm^3). This mass abuts the medial pleural margin and there is a trace pleural effusion 1, nonspecific for malignancy. There are few scattered bilateral pulmonary nodules which are stable to mildly increased. For example a superior segment right lower lobe nodule on image 63 series 507 measures 9 by 8 by 7 mm, and previously measured 8 by 7 by 6 mm. No definite new nodules identified. These nodules likely reflect metastatic disease. Musculoskeletal: Thoracic spondylosis. No compelling findings of thoracic skeletal metastatic disease. CT ABDOMEN PELVIS FINDINGS Hepatobiliary: Hypodensity compatible with benign steatosis in segment 4 of the liver posteriorly. This warrants no further workup. There is also some steatosis medially in segment 4b of the liver adjacent to the falciform ligament. Large gallstone in the gallbladder measuring up to 4.8 cm. Distal to this stone within the gallbladder there is diffuse homogeneous accentuated density probably reflecting concentrated sludge distal to the stone; bile proximal to the stone is of normal fluid density. Pancreas: Better seen than on prior exams due to loss motion artifact today, there is a 6 mm fluid density lesion in the pancreatic head on image 74  of series 500 for which is probably a tiny cystic lesion. Possibilities include a small postinflammatory cystic lesion or tiny indolent intraductal papillary mucinous neoplasm. Typical guidelines would call for follow up pancreatic protocol MRI with and without contrast in 1 years time. However, in this clinical context, satisfactory surveillance imaging can probably be achieved with anticipated follow up oncology CT imaging of the abdomen related to the patient's metastatic thoracic malignancy. Spleen: Unremarkable Adrenals/Urinary Tract: Unremarkable Stomach/Bowel: Unremarkable Vascular/Lymphatic: Atherosclerosis is present, including aortoiliac atherosclerotic disease. Aortocaval lymph node 0.7 cm in short axis on image 87 series 504, within normal limits. No overtly pathologic adenopathy is identified in the abdomen or pelvis. Reproductive: Uterus absent.  Adnexa unremarkable. Other: Low-level subcutaneous edema particularly laterally and posteriorly in the hip region. Musculoskeletal: Bridging degenerative facet spurring at L5-S1 especially on the right. Lumbar spondylosis and degenerative disc disease contribute to impingement most notably at L3-4, L4-5, and L5-S1. Low position of the anorectal junction compatible with pelvic floor laxity. Sclerotic lesion with lobulated borders the lateral margin of the left femoral head and neck on image 94 series 505, no change from 02/12/2022, favor benign etiology such as bone island or densely calcified enchondroma over metastatic lesion. IMPRESSION: 1. The dominant left lower lobe mass has a volume of 74 cubic cm, previously 54 cubic cm. This mass abuts the medial pleural margin and there is a trace left pleural effusion. 2. Scattered bilateral pulmonary nodules are stable to mildly increased in size, compatible with metastatic disease. 3. Mild and mildly worsened right hilar and left paratracheal adenopathy. 4. No findings of metastatic disease to the abdomen/pelvis or  skeleton. 5. 6 mm fluid density lesion in the pancreatic head, probably a tiny cystic lesion. Possibilities include a small postinflammatory cystic lesion or tiny indolent intraductal papillary mucinous neoplasm. Typical guidelines would call for follow up pancreatic protocol MRI with and without contrast in 1 years time. However, in this clinical context, satisfactory surveillance imaging can probably be achieved with anticipated follow up oncology CT imaging of the abdomen related to the patient's metastatic thoracic malignancy. 6. Other imaging findings of potential clinical significance:  Coronary, aortic arch, and branch vessel atherosclerotic vascular disease. Cholelithiasis. Low position of the anorectal junction compatible with pelvic floor laxity. Lumbar spondylosis and degenerative disc disease causing impingement most notably at L3-4, L4-5, and L5-S1. 7. Low-level subcutaneous edema laterally and posteriorly in the hip region. 8. Aortic atherosclerosis. Aortic Atherosclerosis (ICD10-I70.0). Electronically Signed   By: Gaylyn Rong M.D.   On: 07/16/2022 11:33   US Abdomen Limited RUQ (LIVER/GB)  Result Date: 07/16/2022 CLINICAL DATA:  Right upper quadrant pain EXAM: ULTRASOUND ABDOMEN LIMITED RIGHT UPPER QUADRANT COMPARISON:  CT 07/15/2022 FINDINGS: Gallbladder: Gallbladder is distended. 5 cm gallstone lodged in the gallbladder neck. Small stones and sludge also seen within the gallbladder. No wall thickening or sonographic Murphy sign. Common bile duct: Diameter: Normal caliber, 4 mm Liver: Coarsened, increased echotexture compatible with fatty infiltration. No focal abnormality. Portal vein is patent on color Doppler imaging with normal direction of blood flow towards the liver. Other: None. IMPRESSION: Large 5 cm gallstone lodged in the gallbladder neck. Other smaller stones and sludge within the gallbladder with gallbladder distension. No wall thickening or sonographic Murphy sign. Fatty  liver. Electronically Signed   By: Charlett Nose M.D.   On: 07/16/2022 09:27   DG Chest Portable 1 View  Result Date: 07/16/2022 CLINICAL DATA:  Abdominal pain with nausea and vomiting. History of metastatic lung cancer. EXAM: PORTABLE CHEST 1 VIEW COMPARISON:  CT chest from yesterday. FINDINGS: Unchanged right chest wall port catheter. The heart size and mediastinal contours are within normal limits. Normal pulmonary vascularity. Small metastases in both lungs are not apparent by x-ray. No focal consolidation, pleural effusion, or pneumothorax. No acute osseous abnormality. IMPRESSION: 1. No active disease. Small bilateral pulmonary metastases are not apparent by x-ray. Electronically Signed   By: Obie Dredge M.D.   On: 07/16/2022 09:02      Assessment/Plan Nausea Cholelithiasis  Patient seen and examined and relevant labs and imaging personally reviewed. She has a large gallstone on [place] without radiographic or exam findings consistent with cholecystitis. She does not have other symptoms/characteristics concerning for biliary colic, except the nausea which can be multi-factorial in this patient. Unclear significance or etiology of her elevated lipase as she does not have clinical symptoms of pancreatitis.  She does take steroids at home which can sometimes mask pain or other symptoms.  Her WBC is 14K.   At this time, we do not recommend acute surgical intervention and may begin a diet as tolerated, but we will follow and see how she does.  If she were to worsen and develop signs or symptoms related to her gallbladder then we could rediscuss options.   Discussed with primary service.  FEN: NPO - okay to advance diet as tolerated ID: rocephin VTE: okay for chemical prophylaxis from surgical standpoint  T2DM Non small cell lung cancer, metastatic to brain H/o seizure  I reviewed nursing notes, ED provider notes, hospitalist notes, last 24 h vitals and pain scores, last 48 h intake and output,  last 24 h labs and trends, and last 24 h imaging results.  Letha Cape, [facility] Surgery 07/16/2022, 1:28 PM Please see Amion for pager number during day hours 7:00am-4:30pm

## 2022-07-16 NOTE — ED Notes (Signed)
Port access needed before phenergan can be run. Patient complaining of pain near the IV site.

## 2022-07-16 NOTE — Telephone Encounter (Signed)
Hospital Admission -Pt called and she is in ED and said she will be admitted " slight fever, nausea" . I cancelled her appts for tomorrow.   CT head and [place] abd done today  CT C/A/P done yesterday .

## 2022-07-16 NOTE — ED Provider Notes (Signed)
[place] EMERGENCY DEPARTMENT AT [facility] Provider Note   CSN: [id number] Arrival date & time: 07/16/22  0831     History  Chief Complaint  Patient presents with   Abdominal Pain   Emesis   Nausea    Carolyn Hooper is a 62 y.o. female.   Abdominal Pain Associated symptoms: cough, nausea and vomiting   Emesis Associated symptoms: abdominal pain, cough and headaches   Patient presents for nausea.  Medical history includes anxiety, T2DM (recent diagnosis), HLD, obesity, lung cancer with metastasis to brain, seizures.  She underwent outpatient CT imaging yesterday.  Overnight, she had persistent nausea throughout the night.  Because of this, she was unable to sleep.  She took 8 mg of Zofran at home.  Due to persistent nausea, she called her oncology office today and was advised to come to the ED.  EMS provided additional 4 mg of Zofran.  Patient endorses continued nausea.  She also states that she has had recent cough and headache.  She currently has a dry mouth.  She denies any abdominal discomfort.  She denies any new neurologic symptoms.  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  She has not been able to take her morning medications today.     Home Medications Prior to Admission medications   Medication Sig Start Date End Date Taking? Authorizing Provider  albuterol (VENTOLIN HFA) 108 (90 Base) MCG/ACT inhaler Inhale 2 puffs into the lungs every 6 (six) hours as needed. 01/08/21   [name], FNP  cephALEXin (KEFLEX) 500 MG capsule Take 1 capsule (500 mg total) by mouth 2 (two) times daily. Patient not taking: Reported on 07/12/2022 06/04/22   [name], FNP  clonazePAM (KLONOPIN) 0.5 MG tablet Take 1/2 tablet (0.25 mg) by mouth at bedtime. For anxiety 07/12/22   [name], FNP  dexamethasone (DECADRON) 1 MG tablet Take 3 tablets (3 mg total) by mouth daily with breakfast. 05/28/22   [name], MD   fluconazole (DIFLUCAN) 150 MG tablet Take 1 tablet (150 mg) by mouth every 3 days as needed. 07/12/22   [name], FNP  folic acid (FOLVITE) 1 MG tablet Take 1 tablet (1 mg total) by mouth daily. 05/26/22   [name], PA-C  furosemide (LASIX) 20 MG tablet Take 1 tablet (20 mg total) by mouth daily as needed. 06/20/22   Heilingoetter, [name], PA-C  HYDROcodone-acetaminophen (NORCO) 5-325 MG tablet Take 1 - 2 tablets by mouth every 6 (six) hours as needed for moderate pain. 05/30/22   [name], FNP  insulin glargine (LANTUS SOLOSTAR) 100 UNIT/ML Solostar Pen Inject 8 Units into the skin daily. 07/09/22   [name], FNP  Insulin Pen Needle (PEN NEEDLES) 32G X 4 MM MISC Use to inject insulin daily as directed. 07/09/22   [name], FNP  levETIRAcetam (KEPPRA) 1000 MG tablet Take 1 tablet (1,000 mg total) by mouth 2 (two) times daily. 05/26/22   [name], PA-C  lidocaine (XYLOCAINE) 5 % ointment Apply topically as needed. 05/30/22   [name], FNP  lidocaine-prilocaine (EMLA) cream Apply to port prior to access 04/25/22   [name], MD  magic mouthwash (nystatin, diphenhydrAMINE, alum & mag hydroxide) suspension mixture Take 5 mLs by mouth 3 (three) times daily as needed for mouth pain. 06/04/22   [name], MD  metFORMIN (GLUCOPHAGE) 1000 MG tablet Take 1 tablet (1,000 mg total) by  mouth 2 (two) times daily. 05/10/22   [name], FNP  ondansetron (ZOFRAN) 4 MG tablet Take 1 tablet (4 mg total) by mouth every 6 (six) hours as needed for nausea. 05/30/22   Heilingoetter, [name], PA-C  ondansetron (ZOFRAN) 8 MG tablet Take 1 tablet (8 mg total) by mouth 3 (three) times daily. 05/25/22   Serena Croissant, MD  prochlorperazine (COMPAZINE) 10 MG tablet Take 1 tablet (10 mg total) by mouth every 6 (six) hours as needed. 05/30/22   Heilingoetter, [name], PA-C      Allergies    Adhesive [tape] and Statins    Review of Systems   Review of  Systems  Respiratory:  Positive for cough.   Gastrointestinal:  Positive for abdominal pain, nausea and vomiting.  Neurological:  Positive for headaches.  All other systems reviewed and are negative.   Physical Exam Updated Vital Signs BP 129/63 (BP Location: Right Arm)   Pulse 88   Temp 98.4 F (36.9 C) (Oral)   Resp 19   Ht 5\' 8"  (1.727 m)   Wt 110.8 kg   SpO2 93%   BMI 37.13 kg/m  Physical Exam Vitals and nursing note reviewed.  Constitutional:      General: She is not in acute distress.    Appearance: She is well-developed. She is not ill-appearing, toxic-appearing or diaphoretic.  HENT:     Head: Normocephalic and atraumatic.     Mouth/Throat:     Mouth: Mucous membranes are dry.  Eyes:     Extraocular Movements: Extraocular movements intact.     Conjunctiva/sclera: Conjunctivae normal.  Cardiovascular:     Rate and Rhythm: Normal rate and regular rhythm.     Heart sounds: No murmur heard. Pulmonary:     Effort: Pulmonary effort is normal. No respiratory distress.     Breath sounds: Normal breath sounds. No wheezing or rales.  Chest:     Chest wall: No tenderness.  Abdominal:     General: There is no distension.     Palpations: Abdomen is soft.     Tenderness: There is no abdominal tenderness.  Musculoskeletal:        General: No swelling. Normal range of motion.     Cervical back: Normal range of motion and neck supple.  Skin:    General: Skin is warm and dry.     Coloration: Skin is not jaundiced or pale.  Neurological:     General: No focal deficit present.     Mental Status: She is alert and oriented to person, place, and time.     Comments: Baseline slowed and, at times, stuttering speech  Psychiatric:        Mood and Affect: Mood normal.        Behavior: Behavior normal.     ED Results / Procedures / Treatments   Labs (all labs ordered are listed, but only abnormal results are displayed) Labs Reviewed  COMPREHENSIVE METABOLIC PANEL - Abnormal;  Notable for the following components:      Result Value   Glucose, Bld 253 (*)    Albumin 3.3 (*)    ALT 56 (*)    All other components within normal limits  LIPASE, BLOOD - Abnormal; Notable for the following components:   Lipase 391 (*)    All other components within normal limits  CBC WITH DIFFERENTIAL/PLATELET - Abnormal; Notable for the following components:   WBC 14.0 (*)    RBC 3.82 (*)    MCV  102.6 (*)    Neutro Abs 11.4 (*)    Lymphs Abs 0.6 (*)    Monocytes Absolute 1.1 (*)    Eosinophils Absolute 0.8 (*)    All other components within normal limits  MAGNESIUM - Abnormal; Notable for the following components:   Magnesium 1.4 (*)    All other components within normal limits  RESP PANEL BY RT-PCR (RSV, FLU A&B, COVID)  [id number]  URINALYSIS, ROUTINE W REFLEX MICROSCOPIC    EKG EKG Interpretation  Date/Time:  Tuesday July 16 2022 08:47:09 EDT Ventricular Rate:  106 PR Interval:  161 QRS Duration: 92 QT Interval:  340 QTC Calculation: 452 R Axis:   -45 Text Interpretation: Sinus tachycardia Probable left atrial enlargement LAD, consider left anterior fascicular block Low voltage, precordial leads Borderline T abnormalities, anterior leads Confirmed by Gloris Manchester ([id number]) on 07/16/2022 10:42:34 AM  Radiology CT Head Wo Contrast  Result Date: 07/16/2022 CLINICAL DATA:  Headache EXAM: CT HEAD WITHOUT CONTRAST TECHNIQUE: Contiguous axial images were obtained from the base of the skull through the vertex without intravenous contrast. RADIATION DOSE REDUCTION: This exam was performed according to the departmental dose-optimization program which includes automated exposure control, adjustment of the mA and/or kV according to patient size and/or use of iterative reconstruction technique. COMPARISON:  Brain MRI 06/06/2022. FINDINGS: Brain: There is no evidence of acute intracranial hemorrhage, extra-axial fluid collection, or acute infarct Hypodensity in the left precentral gyrus  corresponds to the known metastatic lesion and surrounding vasogenic edema, grossly similar to the prior MRI from 06/06/2022, allowing for differences in modality. There is no significant regional mass effect. The smaller lesion in the right parietal cortex near the vertex is not well seen on the current study. Parenchymal volume is normal. The ventricles are stable and normal in size. The pituitary and suprasellar region are normal. There is no midline shift. The known right IAC vestibular schwannoma is not seen on the current study. Vascular: No hyperdense vessel or unexpected calcification. Skull: Normal. Negative for fracture or focal lesion. Sinuses/Orbits: The imaged paranasal sinuses are clear. The globes and orbits are unremarkable. Other: None. IMPRESSION: 1. No acute intracranial pathology. 2. Hypodensity in the left precentral gyrus corresponding to the known metastatic lesion and surrounding vasogenic edema, grossly similar to the prior MRI from 06/05/2022, allowing for differences in modality. Electronically Signed   By: Lesia Hausen M.D.   On: 07/16/2022 11:57   CT Chest W Contrast  Result Date: 07/16/2022 CLINICAL DATA:  Restaging non-small cell lung cancer * Tracking Code: BO * EXAM: CT CHEST, ABDOMEN, AND PELVIS WITH CONTRAST TECHNIQUE: Multidetector CT imaging of the chest, abdomen and pelvis was performed following the standard protocol during bolus administration of intravenous contrast. RADIATION DOSE REDUCTION: This exam was performed according to the departmental dose-optimization program which includes automated exposure control, adjustment of the mA and/or kV according to patient size and/or use of iterative reconstruction technique. CONTRAST:  OMNIPAQUE IOHEXOL 300 MG/ML  SOLN COMPARISON:  Multiple exams, including 05/13/2022 FINDINGS: CT CHEST FINDINGS Cardiovascular: Right Port-A-Cath tip: SVC. Coronary, aortic arch, and branch vessel atherosclerotic vascular disease.  Mediastinum/Nodes: Mild and mildly worsened thoracic adenopathy. Left paratracheal node 1.2 cm in short axis on image 25 series 504, previously 0.9 cm. Right hilar node 1.1 cm in short axis on image 29 series 504, previously 0.8 cm. Two adjacent infrahilar nodes on the right side are mildly enlarged at 1.0 cm in diameter each on image 35 series 500 for, previously 0.9 cm.  Lungs/Pleura: Left lower lobe mass 6.4 by 4.1 by 5.4 cm (volume = 74 cm^3), previously 5.5 by 4.0 by 4.7 cm (volume = 54 cm^3). This mass abuts the medial pleural margin and there is a trace pleural effusion 1, nonspecific for malignancy. There are few scattered bilateral pulmonary nodules which are stable to mildly increased. For example a superior segment right lower lobe nodule on image 63 series 507 measures 9 by 8 by 7 mm, and previously measured 8 by 7 by 6 mm. No definite new nodules identified. These nodules likely reflect metastatic disease. Musculoskeletal: Thoracic spondylosis. No compelling findings of thoracic skeletal metastatic disease. CT ABDOMEN PELVIS FINDINGS Hepatobiliary: Hypodensity compatible with benign steatosis in segment 4 of the liver posteriorly. This warrants no further workup. There is also some steatosis medially in segment 4b of the liver adjacent to the falciform ligament. Large gallstone in the gallbladder measuring up to 4.8 cm. Distal to this stone within the gallbladder there is diffuse homogeneous accentuated density probably reflecting concentrated sludge distal to the stone; bile proximal to the stone is of normal fluid density. Pancreas: Better seen than on prior exams due to loss motion artifact today, there is a 6 mm fluid density lesion in the pancreatic head on image 74 of series 500 for which is probably a tiny cystic lesion. Possibilities include a small postinflammatory cystic lesion or tiny indolent intraductal papillary mucinous neoplasm. Typical guidelines would call for follow up pancreatic  protocol MRI with and without contrast in 1 years time. However, in this clinical context, satisfactory surveillance imaging can probably be achieved with anticipated follow up oncology CT imaging of the abdomen related to the patient's metastatic thoracic malignancy. Spleen: Unremarkable Adrenals/Urinary Tract: Unremarkable Stomach/Bowel: Unremarkable Vascular/Lymphatic: Atherosclerosis is present, including aortoiliac atherosclerotic disease. Aortocaval lymph node 0.7 cm in short axis on image 87 series 504, within normal limits. No overtly pathologic adenopathy is identified in the abdomen or pelvis. Reproductive: Uterus absent.  Adnexa unremarkable. Other: Low-level subcutaneous edema particularly laterally and posteriorly in the hip region. Musculoskeletal: Bridging degenerative facet spurring at L5-S1 especially on the right. Lumbar spondylosis and degenerative disc disease contribute to impingement most notably at L3-4, L4-5, and L5-S1. Low position of the anorectal junction compatible with pelvic floor laxity. Sclerotic lesion with lobulated borders the lateral margin of the left femoral head and neck on image 94 series 505, no change from 02/12/2022, favor benign etiology such as bone island or densely calcified enchondroma over metastatic lesion. IMPRESSION: 1. The dominant left lower lobe mass has a volume of 74 cubic cm, previously 54 cubic cm. This mass abuts the medial pleural margin and there is a trace left pleural effusion. 2. Scattered bilateral pulmonary nodules are stable to mildly increased in size, compatible with metastatic disease. 3. Mild and mildly worsened right hilar and left paratracheal adenopathy. 4. No findings of metastatic disease to the abdomen/pelvis or skeleton. 5. 6 mm fluid density lesion in the pancreatic head, probably a tiny cystic lesion. Possibilities include a small postinflammatory cystic lesion or tiny indolent intraductal papillary mucinous neoplasm. Typical guidelines  would call for follow up pancreatic protocol MRI with and without contrast in 1 years time. However, in this clinical context, satisfactory surveillance imaging can probably be achieved with anticipated follow up oncology CT imaging of the abdomen related to the patient's metastatic thoracic malignancy. 6. Other imaging findings of potential clinical significance: Coronary, aortic arch, and branch vessel atherosclerotic vascular disease. Cholelithiasis. Low position of the anorectal junction compatible  with pelvic floor laxity. Lumbar spondylosis and degenerative disc disease causing impingement most notably at L3-4, L4-5, and L5-S1. 7. Low-level subcutaneous edema laterally and posteriorly in the hip region. 8. Aortic atherosclerosis. Aortic Atherosclerosis (ICD10-I70.0). Electronically Signed   By: Gaylyn Rong M.D.   On: 07/16/2022 11:33   CT Abdomen Pelvis W Contrast  Result Date: 07/16/2022 CLINICAL DATA:  Restaging non-small cell lung cancer * Tracking Code: BO * EXAM: CT CHEST, ABDOMEN, AND PELVIS WITH CONTRAST TECHNIQUE: Multidetector CT imaging of the chest, abdomen and pelvis was performed following the standard protocol during bolus administration of intravenous contrast. RADIATION DOSE REDUCTION: This exam was performed according to the departmental dose-optimization program which includes automated exposure control, adjustment of the mA and/or kV according to patient size and/or use of iterative reconstruction technique. CONTRAST:  OMNIPAQUE IOHEXOL 300 MG/ML  SOLN COMPARISON:  Multiple exams, including 05/13/2022 FINDINGS: CT CHEST FINDINGS Cardiovascular: Right Port-A-Cath tip: SVC. Coronary, aortic arch, and branch vessel atherosclerotic vascular disease. Mediastinum/Nodes: Mild and mildly worsened thoracic adenopathy. Left paratracheal node 1.2 cm in short axis on image 25 series 504, previously 0.9 cm. Right hilar node 1.1 cm in short axis on image 29 series 504, previously 0.8 cm.  Two adjacent infrahilar nodes on the right side are mildly enlarged at 1.0 cm in diameter each on image 35 series 500 for, previously 0.9 cm. Lungs/Pleura: Left lower lobe mass 6.4 by 4.1 by 5.4 cm (volume = 74 cm^3), previously 5.5 by 4.0 by 4.7 cm (volume = 54 cm^3). This mass abuts the medial pleural margin and there is a trace pleural effusion 1, nonspecific for malignancy. There are few scattered bilateral pulmonary nodules which are stable to mildly increased. For example a superior segment right lower lobe nodule on image 63 series 507 measures 9 by 8 by 7 mm, and previously measured 8 by 7 by 6 mm. No definite new nodules identified. These nodules likely reflect metastatic disease. Musculoskeletal: Thoracic spondylosis. No compelling findings of thoracic skeletal metastatic disease. CT ABDOMEN PELVIS FINDINGS Hepatobiliary: Hypodensity compatible with benign steatosis in segment 4 of the liver posteriorly. This warrants no further workup. There is also some steatosis medially in segment 4b of the liver adjacent to the falciform ligament. Large gallstone in the gallbladder measuring up to 4.8 cm. Distal to this stone within the gallbladder there is diffuse homogeneous accentuated density probably reflecting concentrated sludge distal to the stone; bile proximal to the stone is of normal fluid density. Pancreas: Better seen than on prior exams due to loss motion artifact today, there is a 6 mm fluid density lesion in the pancreatic head on image 74 of series 500 for which is probably a tiny cystic lesion. Possibilities include a small postinflammatory cystic lesion or tiny indolent intraductal papillary mucinous neoplasm. Typical guidelines would call for follow up pancreatic protocol MRI with and without contrast in 1 years time. However, in this clinical context, satisfactory surveillance imaging can probably be achieved with anticipated follow up oncology CT imaging of the abdomen related to the patient's  metastatic thoracic malignancy. Spleen: Unremarkable Adrenals/Urinary Tract: Unremarkable Stomach/Bowel: Unremarkable Vascular/Lymphatic: Atherosclerosis is present, including aortoiliac atherosclerotic disease. Aortocaval lymph node 0.7 cm in short axis on image 87 series 504, within normal limits. No overtly pathologic adenopathy is identified in the abdomen or pelvis. Reproductive: Uterus absent.  Adnexa unremarkable. Other: Low-level subcutaneous edema particularly laterally and posteriorly in the hip region. Musculoskeletal: Bridging degenerative facet spurring at L5-S1 especially on the right. Lumbar spondylosis  and degenerative disc disease contribute to impingement most notably at L3-4, L4-5, and L5-S1. Low position of the anorectal junction compatible with pelvic floor laxity. Sclerotic lesion with lobulated borders the lateral margin of the left femoral head and neck on image 94 series 505, no change from 02/12/2022, favor benign etiology such as bone island or densely calcified enchondroma over metastatic lesion. IMPRESSION: 1. The dominant left lower lobe mass has a volume of 74 cubic cm, previously 54 cubic cm. This mass abuts the medial pleural margin and there is a trace left pleural effusion. 2. Scattered bilateral pulmonary nodules are stable to mildly increased in size, compatible with metastatic disease. 3. Mild and mildly worsened right hilar and left paratracheal adenopathy. 4. No findings of metastatic disease to the abdomen/pelvis or skeleton. 5. 6 mm fluid density lesion in the pancreatic head, probably a tiny cystic lesion. Possibilities include a small postinflammatory cystic lesion or tiny indolent intraductal papillary mucinous neoplasm. Typical guidelines would call for follow up pancreatic protocol MRI with and without contrast in 1 years time. However, in this clinical context, satisfactory surveillance imaging can probably be achieved with anticipated follow up oncology CT imaging of  the abdomen related to the patient's metastatic thoracic malignancy. 6. Other imaging findings of potential clinical significance: Coronary, aortic arch, and branch vessel atherosclerotic vascular disease. Cholelithiasis. Low position of the anorectal junction compatible with pelvic floor laxity. Lumbar spondylosis and degenerative disc disease causing impingement most notably at L3-4, L4-5, and L5-S1. 7. Low-level subcutaneous edema laterally and posteriorly in the hip region. 8. Aortic atherosclerosis. Aortic Atherosclerosis (ICD10-I70.0). Electronically Signed   By: Gaylyn Rong M.D.   On: 07/16/2022 11:33   US Abdomen Limited RUQ (LIVER/GB)  Result Date: 07/16/2022 CLINICAL DATA:  Right upper quadrant pain EXAM: ULTRASOUND ABDOMEN LIMITED RIGHT UPPER QUADRANT COMPARISON:  CT 07/15/2022 FINDINGS: Gallbladder: Gallbladder is distended. 5 cm gallstone lodged in the gallbladder neck. Small stones and sludge also seen within the gallbladder. No wall thickening or sonographic Murphy sign. Common bile duct: Diameter: Normal caliber, 4 mm Liver: Coarsened, increased echotexture compatible with fatty infiltration. No focal abnormality. Portal vein is patent on color Doppler imaging with normal direction of blood flow towards the liver. Other: None. IMPRESSION: Large 5 cm gallstone lodged in the gallbladder neck. Other smaller stones and sludge within the gallbladder with gallbladder distension. No wall thickening or sonographic Murphy sign. Fatty liver. Electronically Signed   By: Charlett Nose M.D.   On: 07/16/2022 09:27   DG Chest Portable 1 View  Result Date: 07/16/2022 CLINICAL DATA:  Abdominal pain with nausea and vomiting. History of metastatic lung cancer. EXAM: PORTABLE CHEST 1 VIEW COMPARISON:  CT chest from yesterday. FINDINGS: Unchanged right chest wall port catheter. The heart size and mediastinal contours are within normal limits. Normal pulmonary vascularity. Small metastases in both lungs are  not apparent by x-ray. No focal consolidation, pleural effusion, or pneumothorax. No acute osseous abnormality. IMPRESSION: 1. No active disease. Small bilateral pulmonary metastases are not apparent by x-ray. Electronically Signed   By: Obie Dredge M.D.   On: 07/16/2022 09:02    Procedures Procedures    Medications Ordered in ED Medications  promethazine (PHENERGAN) 6.25 mg in sodium chloride 0.9 % 50 mL IVPB (has no administration in time range)  metoCLOPramide (REGLAN) injection 10 mg (10 mg Intravenous Given 07/16/22 0956)  lactated ringers bolus 500 mL (0 mLs Intravenous Stopped 07/16/22 1107)  dexamethasone (DECADRON) injection 3 mg (3 mg Intravenous Given 07/16/22  8119)  levETIRAcetam (KEPPRA) IVPB 1000 mg/100 mL premix (1,000 mg Intravenous New Bag/Given 07/16/22 1000)  magnesium sulfate IVPB 2 g 50 mL (2 g Intravenous New Bag/Given 07/16/22 1155)    ED Course/ Medical Decision Making/ A&P                             Medical Decision Making Amount and/or Complexity of Data Reviewed Labs: ordered. Radiology: ordered.  Risk Prescription drug management. Decision regarding hospitalization.   This patient presents to the ED for concern of nausea, this involves an extensive number of treatment options, and is a complaint that carries with it a high risk of complications and morbidity.  The differential diagnosis includes worsening metastatic disease, polypharmacy, medication withdrawal, enteritis, gastritis, GERD   Co morbidities that complicate the patient evaluation  anxiety, T2DM (recent diagnosis), HLD, obesity, lung cancer with metastasis to brain, seizures   Additional history obtained:  Additional history obtained from EMS External records from outside source obtained and reviewed including EMR   Lab Tests:  I Ordered, and personally interpreted labs.  The pertinent results include: Lipase is elevated.  Hepatobiliary enzymes are unremarkable.  Hypomagnesemia is  present with otherwise normal electrolytes.  A leukocytosis is present.   Imaging Studies ordered:  I ordered imaging studies including chest x-ray, right upper quadrant ultrasound, CT head I independently visualized and interpreted imaging which showed redemonstration of known metastatic disease on CT head.  Right upper quadrant ultrasound showed redemonstration of known large gallstone in addition to small gallstones and sludge present in gallbladder.  No wall thickening is present.  CT imaging from yesterday was reviewed.  The studies showed redemonstration of known metastatic disease with increased size of left lower lobe lung mass, increased bilateral pulmonary nodules, worsening right hilar and left paratracheal adenopathy, 6 mm fluid density lesion in pancreatic head I agree with the radiologist interpretation   Cardiac Monitoring: / EKG:  The patient was maintained on a cardiac monitor.  I personally viewed and interpreted the cardiac monitored which showed an underlying rhythm of: Sinus rhythm   Problem List / ED Course / Critical interventions / Medication management  Patient presents for nausea.  Onset of nausea was last night.  Due to the nausea, she was unable to sleep throughout the night.  Nausea was refractory to 8 mg of Zofran at home and 4 mg of Zofran with EMS.  Patient ports recent diagnosis of diabetes.  EMS reports blood glucose in the 200s.  On arrival, patient is alert and oriented.  She has a slow and stuttering speech which she states is baseline for her.  Her abdomen is soft and without tenderness.  She endorses recent mild headache and cough.  She has not received her morning medications today.  Morning medications were ordered.  In addition, IV fluids were ordered for hydration and Reglan was ordered for ongoing nausea.  Laboratory workup was initiated.  Lab findings are notable for elevated lipase, leukocytosis and hypomagnesemia.  Replacement magnesium was ordered.  I  contacted radiology to get stat reads on CT scans from yesterday.  CT scan findings from yesterday and today are detailed above.  On reassessment, patient has continued nausea.  Plan will be for admission for refractory nausea and p.o. intolerance.  Patient is agreeable to this.  I spoke with Tresa Endo with general surgery team who will come evaluate the patient for consideration of her cholelithiasis is possibly contributing to her  nausea and lipase elevation.  Patient was admitted to medicine for further management. I ordered medication including IV fluids for hydration; Reglan and Phenergan for nausea; Keppra for seizure prophylaxis; magnesium sulfate for hypomagnesemia Reevaluation of the patient after these medicines showed that the patient improved I have reviewed the patients home medicines and have made adjustments as needed   Social Determinants of Health:  Has access to outpatient care        Final Clinical Impression(s) / ED Diagnoses Final diagnoses:  Nausea    Rx / DC Orders ED Discharge Orders     None         Gloris Manchester, MD 07/16/22 1319

## 2022-07-17 ENCOUNTER — Inpatient Hospital Stay: Payer: BC Managed Care – PPO | Admitting: Physician Assistant

## 2022-07-17 ENCOUNTER — Other Ambulatory Visit (HOSPITAL_COMMUNITY): Payer: Self-pay

## 2022-07-17 ENCOUNTER — Encounter: Payer: Self-pay | Admitting: Internal Medicine

## 2022-07-17 ENCOUNTER — Inpatient Hospital Stay: Payer: BC Managed Care – PPO

## 2022-07-17 DIAGNOSIS — C7931 Secondary malignant neoplasm of brain: Secondary | ICD-10-CM | POA: Diagnosis not present

## 2022-07-17 DIAGNOSIS — R110 Nausea: Secondary | ICD-10-CM | POA: Diagnosis not present

## 2022-07-17 DIAGNOSIS — K8020 Calculus of gallbladder without cholecystitis without obstruction: Secondary | ICD-10-CM | POA: Diagnosis not present

## 2022-07-17 DIAGNOSIS — E119 Type 2 diabetes mellitus without complications: Secondary | ICD-10-CM | POA: Diagnosis not present

## 2022-07-17 LAB — CBC
HCT: 33.6 % — ABNORMAL LOW (ref 36.0–46.0)
Hemoglobin: 10.5 g/dL — ABNORMAL LOW (ref 12.0–15.0)
MCH: 32.4 pg (ref 26.0–34.0)
MCHC: 31.3 g/dL (ref 30.0–36.0)
MCV: 103.7 fL — ABNORMAL HIGH (ref 80.0–100.0)
Platelets: 158 10*3/uL (ref 150–400)
RBC: 3.24 MIL/uL — ABNORMAL LOW (ref 3.87–5.11)
RDW: 15.1 % (ref 11.5–15.5)
WBC: 9.8 10*3/uL (ref 4.0–10.5)
nRBC: 0 % (ref 0.0–0.2)

## 2022-07-17 LAB — COMPREHENSIVE METABOLIC PANEL
ALT: 39 U/L (ref 0–44)
AST: 14 U/L — ABNORMAL LOW (ref 15–41)
Albumin: 2.8 g/dL — ABNORMAL LOW (ref 3.5–5.0)
Alkaline Phosphatase: 46 U/L (ref 38–126)
Anion gap: 8 (ref 5–15)
BUN: 10 mg/dL (ref 8–23)
CO2: 27 mmol/L (ref 22–32)
Calcium: 8.1 mg/dL — ABNORMAL LOW (ref 8.9–10.3)
Chloride: 101 mmol/L (ref 98–111)
Creatinine, Ser: 0.6 mg/dL (ref 0.44–1.00)
GFR, Estimated: >60 mL/min (ref >60)
Glucose, Bld: 180 mg/dL — ABNORMAL HIGH (ref 70–99)
Potassium: 3.3 mmol/L — ABNORMAL LOW (ref 3.5–5.1)
Sodium: 136 mmol/L (ref 135–145)
Total Bilirubin: 0.5 mg/dL (ref 0.3–1.2)
Total Protein: 5.6 g/dL — ABNORMAL LOW (ref 6.5–8.1)

## 2022-07-17 LAB — GLUCOSE, CAPILLARY: Glucose-Capillary: 152 mg/dL — ABNORMAL HIGH (ref 70–99)

## 2022-07-17 LAB — PROTIME-INR
INR: 1.1 (ref 0.8–1.2)
Prothrombin Time: 14.5 seconds (ref 11.4–15.2)

## 2022-07-17 LAB — MAGNESIUM: Magnesium: 1.7 mg/dL (ref 1.7–2.4)

## 2022-07-17 MED ORDER — HEPARIN SOD (PORK) LOCK FLUSH 100 UNIT/ML IV SOLN
500.0000 [IU] | INTRAVENOUS | Status: AC | PRN
  Administered 2022-07-17: 500 [IU]

## 2022-07-17 MED ORDER — POTASSIUM CHLORIDE CRYS ER 20 MEQ PO TBCR
40.0000 meq | EXTENDED_RELEASE_TABLET | Freq: Once | ORAL | Status: AC
  Administered 2022-07-17: 40 meq via ORAL
  Filled 2022-07-17: qty 2

## 2022-07-17 MED ORDER — PANTOPRAZOLE SODIUM 40 MG PO TBEC
40.0000 mg | DELAYED_RELEASE_TABLET | Freq: Two times a day (BID) | ORAL | 0 refills | Status: DC
  Filled 2022-07-17: qty 60, 30d supply, fill #0

## 2022-07-17 MED ORDER — PANTOPRAZOLE SODIUM 40 MG PO TBEC: 40.0000 mg | DELAYED_RELEASE_TABLET | Freq: Two times a day (BID) | ORAL | Status: DC

## 2022-07-17 MED ORDER — ONDANSETRON HCL 4 MG PO TABS
4.0000 mg | ORAL_TABLET | Freq: Four times a day (QID) | ORAL | 2 refills | Status: DC | PRN
Start: 2022-07-17 — End: 2022-11-27
  Filled 2022-07-17: qty 30, 8d supply, fill #0

## 2022-07-17 NOTE — TOC CM/SW Note (Signed)
Transition of Care [facility]) Screening Note  Patient Details  Name: Carolyn Hooper Date of Birth: 1960/03/27  Transition of Care [facility]) CM/SW Contact:    Ewing Schlein, LCSW Phone Number: 07/17/2022, 10:32 AM  Transition of Care Department [facility]) has reviewed patient and no TOC needs have been identified at this time. We will continue to monitor patient advancement through interdisciplinary progression rounds. If new patient transition needs arise, please place a TOC consult.

## 2022-07-17 NOTE — Progress Notes (Signed)
Subjective: Doing well today.  No further nausea or abdominal pain.  Tolerating solid diet  ROS: See above, otherwise other systems negative  Objective: Vital signs in last 24 hours: Temp:  [97.8 F (36.6 C)-98.6 F (37 C)] 97.8 F (36.6 C) (05/01 0449) Pulse Rate:  [70-99] 70 (05/01 0449) Resp:  [14-28] 18 (05/01 0449) BP: (112-137)/(53-80) 137/80 (05/01 0449) SpO2:  [92 %-98 %] 94 % (05/01 0449) Last BM Date : 07/15/22  Intake/Output from previous day: 04/30 0701 - 05/01 0700 In: 1971.2 [P.O.:720; I.V.:1010.3; IV Piggyback:240.9] Out: 1150 [Urine:1150] Intake/Output this shift: Total I/O In: 240 [P.O.:240] Out: -   PE: Abd: soft, NT, ND  Lab Results:  Recent Labs    07/16/22 0904 07/17/22 0427  WBC 14.0* 9.8  HGB 12.6 10.5*  HCT 39.2 33.6*  PLT 185 158   BMET Recent Labs    07/16/22 0904 07/17/22 0427  NA 139 136  K 3.6 3.3*  CL 103 101  CO2 23 27  GLUCOSE 253* 180*  BUN 9 10  CREATININE 0.76 0.60  CALCIUM 9.2 8.1*   PT/INR Recent Labs    07/17/22 0427  LABPROT 14.5  INR 1.1   CMP     Component Value Date/Time   NA 136 07/17/2022 0427   NA 141 07/12/2022 1114   K 3.3 (L) 07/17/2022 0427   CL 101 07/17/2022 0427   CO2 27 07/17/2022 0427   GLUCOSE 180 (H) 07/17/2022 0427   BUN 10 07/17/2022 0427   BUN 11 07/12/2022 1114   CREATININE 0.60 07/17/2022 0427   CREATININE 0.75 06/26/2022 1029   CALCIUM 8.1 (L) 07/17/2022 0427   PROT 5.6 (L) 07/17/2022 0427   PROT 5.9 (L) 07/12/2022 1114   ALBUMIN 2.8 (L) 07/17/2022 0427   ALBUMIN 3.8 (L) 07/12/2022 1114   AST 14 (L) 07/17/2022 0427   AST 22 06/26/2022 1029   ALT 39 07/17/2022 0427   ALT 76 (H) 06/26/2022 1029   ALKPHOS 46 07/17/2022 0427   BILITOT 0.5 07/17/2022 0427   BILITOT 0.4 07/12/2022 1114   BILITOT 0.6 06/26/2022 1029   GFRNONAA TEST NOT PERFORMED 07/17/2022 0427   GFRNONAA >60 06/26/2022 1029   GFRAA TEST NOT PERFORMED 07/17/2022 0427   Lipase     Component Value  Date/Time   LIPASE 391 (H) 07/16/2022 0904       Studies/Results: CT Head Wo Contrast  Result Date: 07/16/2022 CLINICAL DATA:  Headache EXAM: CT HEAD WITHOUT CONTRAST TECHNIQUE: Contiguous axial images were obtained from the base of the skull through the vertex without intravenous contrast. RADIATION DOSE REDUCTION: This exam was performed according to the departmental dose-optimization program which includes automated exposure control, adjustment of the mA and/or kV according to patient size and/or use of iterative reconstruction technique. COMPARISON:  Brain MRI 06/06/2022. FINDINGS: Brain: There is no evidence of acute intracranial hemorrhage, extra-axial fluid collection, or acute infarct Hypodensity in the left precentral gyrus corresponds to the known metastatic lesion and surrounding vasogenic edema, grossly similar to the prior MRI from 06/06/2022, allowing for differences in modality. There is no significant regional mass effect. The smaller lesion in the right parietal cortex near the vertex is not well seen on the current study. Parenchymal volume is normal. The ventricles are stable and normal in size. The pituitary and suprasellar region are normal. There is no midline shift. The known right IAC vestibular schwannoma is not seen on the current study. Vascular: No hyperdense vessel or unexpected  calcification. Skull: Normal. Negative for fracture or focal lesion. Sinuses/Orbits: The imaged paranasal sinuses are clear. The globes and orbits are unremarkable. Other: None. IMPRESSION: 1. No acute intracranial pathology. 2. Hypodensity in the left precentral gyrus corresponding to the known metastatic lesion and surrounding vasogenic edema, grossly similar to the prior MRI from 06/05/2022, allowing for differences in modality. Electronically Signed   By: Lesia Hausen M.D.   On: 07/16/2022 11:57   CT Chest W Contrast  Result Date: 07/16/2022 CLINICAL DATA:  Restaging non-small cell lung cancer *  Tracking Code: BO * EXAM: CT CHEST, ABDOMEN, AND PELVIS WITH CONTRAST TECHNIQUE: Multidetector CT imaging of the chest, abdomen and pelvis was performed following the standard protocol during bolus administration of intravenous contrast. RADIATION DOSE REDUCTION: This exam was performed according to the departmental dose-optimization program which includes automated exposure control, adjustment of the mA and/or kV according to patient size and/or use of iterative reconstruction technique. CONTRAST:  OMNIPAQUE IOHEXOL 300 MG/ML  SOLN COMPARISON:  Multiple exams, including 05/13/2022 FINDINGS: CT CHEST FINDINGS Cardiovascular: Right Port-A-Cath tip: SVC. Coronary, aortic arch, and branch vessel atherosclerotic vascular disease. Mediastinum/Nodes: Mild and mildly worsened thoracic adenopathy. Left paratracheal node 1.2 cm in short axis on image 25 series 504, previously 0.9 cm. Right hilar node 1.1 cm in short axis on image 29 series 504, previously 0.8 cm. Two adjacent infrahilar nodes on the right side are mildly enlarged at 1.0 cm in diameter each on image 35 series 500 for, previously 0.9 cm. Lungs/Pleura: Left lower lobe mass 6.4 by 4.1 by 5.4 cm (volume = 74 cm^3), previously 5.5 by 4.0 by 4.7 cm (volume = 54 cm^3). This mass abuts the medial pleural margin and there is a trace pleural effusion 1, nonspecific for malignancy. There are few scattered bilateral pulmonary nodules which are stable to mildly increased. For example a superior segment right lower lobe nodule on image 63 series 507 measures 9 by 8 by 7 mm, and previously measured 8 by 7 by 6 mm. No definite new nodules identified. These nodules likely reflect metastatic disease. Musculoskeletal: Thoracic spondylosis. No compelling findings of thoracic skeletal metastatic disease. CT ABDOMEN PELVIS FINDINGS Hepatobiliary: Hypodensity compatible with benign steatosis in segment 4 of the liver posteriorly. This warrants no further workup. There is also  some steatosis medially in segment 4b of the liver adjacent to the falciform ligament. Large gallstone in the gallbladder measuring up to 4.8 cm. Distal to this stone within the gallbladder there is diffuse homogeneous accentuated density probably reflecting concentrated sludge distal to the stone; bile proximal to the stone is of normal fluid density. Pancreas: Better seen than on prior exams due to loss motion artifact today, there is a 6 mm fluid density lesion in the pancreatic head on image 74 of series 500 for which is probably a tiny cystic lesion. Possibilities include a small postinflammatory cystic lesion or tiny indolent intraductal papillary mucinous neoplasm. Typical guidelines would call for follow up pancreatic protocol MRI with and without contrast in 1 years time. However, in this clinical context, satisfactory surveillance imaging can probably be achieved with anticipated follow up oncology CT imaging of the abdomen related to the patient's metastatic thoracic malignancy. Spleen: Unremarkable Adrenals/Urinary Tract: Unremarkable Stomach/Bowel: Unremarkable Vascular/Lymphatic: Atherosclerosis is present, including aortoiliac atherosclerotic disease. Aortocaval lymph node 0.7 cm in short axis on image 87 series 504, within normal limits. No overtly pathologic adenopathy is identified in the abdomen or pelvis. Reproductive: Uterus absent.  Adnexa unremarkable. Other: Low-level  subcutaneous edema particularly laterally and posteriorly in the hip region. Musculoskeletal: Bridging degenerative facet spurring at L5-S1 especially on the right. Lumbar spondylosis and degenerative disc disease contribute to impingement most notably at L3-4, L4-5, and L5-S1. Low position of the anorectal junction compatible with pelvic floor laxity. Sclerotic lesion with lobulated borders the lateral margin of the left femoral head and neck on image 94 series 505, no change from 02/12/2022, favor benign etiology such as bone  island or densely calcified enchondroma over metastatic lesion. IMPRESSION: 1. The dominant left lower lobe mass has a volume of 74 cubic cm, previously 54 cubic cm. This mass abuts the medial pleural margin and there is a trace left pleural effusion. 2. Scattered bilateral pulmonary nodules are stable to mildly increased in size, compatible with metastatic disease. 3. Mild and mildly worsened right hilar and left paratracheal adenopathy. 4. No findings of metastatic disease to the abdomen/pelvis or skeleton. 5. 6 mm fluid density lesion in the pancreatic head, probably a tiny cystic lesion. Possibilities include a small postinflammatory cystic lesion or tiny indolent intraductal papillary mucinous neoplasm. Typical guidelines would call for follow up pancreatic protocol MRI with and without contrast in 1 years time. However, in this clinical context, satisfactory surveillance imaging can probably be achieved with anticipated follow up oncology CT imaging of the abdomen related to the patient's metastatic thoracic malignancy. 6. Other imaging findings of potential clinical significance: Coronary, aortic arch, and branch vessel atherosclerotic vascular disease. Cholelithiasis. Low position of the anorectal junction compatible with pelvic floor laxity. Lumbar spondylosis and degenerative disc disease causing impingement most notably at L3-4, L4-5, and L5-S1. 7. Low-level subcutaneous edema laterally and posteriorly in the hip region. 8. Aortic atherosclerosis. Aortic Atherosclerosis (ICD10-I70.0). Electronically Signed   By: Gaylyn Rong M.D.   On: 07/16/2022 11:33   CT Abdomen Pelvis W Contrast  Result Date: 07/16/2022 CLINICAL DATA:  Restaging non-small cell lung cancer * Tracking Code: BO * EXAM: CT CHEST, ABDOMEN, AND PELVIS WITH CONTRAST TECHNIQUE: Multidetector CT imaging of the chest, abdomen and pelvis was performed following the standard protocol during bolus administration of intravenous contrast.  RADIATION DOSE REDUCTION: This exam was performed according to the departmental dose-optimization program which includes automated exposure control, adjustment of the mA and/or kV according to patient size and/or use of iterative reconstruction technique. CONTRAST:  OMNIPAQUE IOHEXOL 300 MG/ML  SOLN COMPARISON:  Multiple exams, including 05/13/2022 FINDINGS: CT CHEST FINDINGS Cardiovascular: Right Port-A-Cath tip: SVC. Coronary, aortic arch, and branch vessel atherosclerotic vascular disease. Mediastinum/Nodes: Mild and mildly worsened thoracic adenopathy. Left paratracheal node 1.2 cm in short axis on image 25 series 504, previously 0.9 cm. Right hilar node 1.1 cm in short axis on image 29 series 504, previously 0.8 cm. Two adjacent infrahilar nodes on the right side are mildly enlarged at 1.0 cm in diameter each on image 35 series 500 for, previously 0.9 cm. Lungs/Pleura: Left lower lobe mass 6.4 by 4.1 by 5.4 cm (volume = 74 cm^3), previously 5.5 by 4.0 by 4.7 cm (volume = 54 cm^3). This mass abuts the medial pleural margin and there is a trace pleural effusion 1, nonspecific for malignancy. There are few scattered bilateral pulmonary nodules which are stable to mildly increased. For example a superior segment right lower lobe nodule on image 63 series 507 measures 9 by 8 by 7 mm, and previously measured 8 by 7 by 6 mm. No definite new nodules identified. These nodules likely reflect metastatic disease. Musculoskeletal: Thoracic spondylosis. No compelling  findings of thoracic skeletal metastatic disease. CT ABDOMEN PELVIS FINDINGS Hepatobiliary: Hypodensity compatible with benign steatosis in segment 4 of the liver posteriorly. This warrants no further workup. There is also some steatosis medially in segment 4b of the liver adjacent to the falciform ligament. Large gallstone in the gallbladder measuring up to 4.8 cm. Distal to this stone within the gallbladder there is diffuse homogeneous accentuated  density probably reflecting concentrated sludge distal to the stone; bile proximal to the stone is of normal fluid density. Pancreas: Better seen than on prior exams due to loss motion artifact today, there is a 6 mm fluid density lesion in the pancreatic head on image 74 of series 500 for which is probably a tiny cystic lesion. Possibilities include a small postinflammatory cystic lesion or tiny indolent intraductal papillary mucinous neoplasm. Typical guidelines would call for follow up pancreatic protocol MRI with and without contrast in 1 years time. However, in this clinical context, satisfactory surveillance imaging can probably be achieved with anticipated follow up oncology CT imaging of the abdomen related to the patient's metastatic thoracic malignancy. Spleen: Unremarkable Adrenals/Urinary Tract: Unremarkable Stomach/Bowel: Unremarkable Vascular/Lymphatic: Atherosclerosis is present, including aortoiliac atherosclerotic disease. Aortocaval lymph node 0.7 cm in short axis on image 87 series 504, within normal limits. No overtly pathologic adenopathy is identified in the abdomen or pelvis. Reproductive: Uterus absent.  Adnexa unremarkable. Other: Low-level subcutaneous edema particularly laterally and posteriorly in the hip region. Musculoskeletal: Bridging degenerative facet spurring at L5-S1 especially on the right. Lumbar spondylosis and degenerative disc disease contribute to impingement most notably at L3-4, L4-5, and L5-S1. Low position of the anorectal junction compatible with pelvic floor laxity. Sclerotic lesion with lobulated borders the lateral margin of the left femoral head and neck on image 94 series 505, no change from 02/12/2022, favor benign etiology such as bone island or densely calcified enchondroma over metastatic lesion. IMPRESSION: 1. The dominant left lower lobe mass has a volume of 74 cubic cm, previously 54 cubic cm. This mass abuts the medial pleural margin and there is a trace  left pleural effusion. 2. Scattered bilateral pulmonary nodules are stable to mildly increased in size, compatible with metastatic disease. 3. Mild and mildly worsened right hilar and left paratracheal adenopathy. 4. No findings of metastatic disease to the abdomen/pelvis or skeleton. 5. 6 mm fluid density lesion in the pancreatic head, probably a tiny cystic lesion. Possibilities include a small postinflammatory cystic lesion or tiny indolent intraductal papillary mucinous neoplasm. Typical guidelines would call for follow up pancreatic protocol MRI with and without contrast in 1 years time. However, in this clinical context, satisfactory surveillance imaging can probably be achieved with anticipated follow up oncology CT imaging of the abdomen related to the patient's metastatic thoracic malignancy. 6. Other imaging findings of potential clinical significance: Coronary, aortic arch, and branch vessel atherosclerotic vascular disease. Cholelithiasis. Low position of the anorectal junction compatible with pelvic floor laxity. Lumbar spondylosis and degenerative disc disease causing impingement most notably at L3-4, L4-5, and L5-S1. 7. Low-level subcutaneous edema laterally and posteriorly in the hip region. 8. Aortic atherosclerosis. Aortic Atherosclerosis (ICD10-I70.0). Electronically Signed   By: Gaylyn Rong M.D.   On: 07/16/2022 11:33   US Abdomen Limited RUQ (LIVER/GB)  Result Date: 07/16/2022 CLINICAL DATA:  Right upper quadrant pain EXAM: ULTRASOUND ABDOMEN LIMITED RIGHT UPPER QUADRANT COMPARISON:  CT 07/15/2022 FINDINGS: Gallbladder: Gallbladder is distended. 5 cm gallstone lodged in the gallbladder neck. Small stones and sludge also seen within the gallbladder. No wall  thickening or sonographic Murphy sign. Common bile duct: Diameter: Normal caliber, 4 mm Liver: Coarsened, increased echotexture compatible with fatty infiltration. No focal abnormality. Portal vein is patent on color Doppler imaging  with normal direction of blood flow towards the liver. Other: None. IMPRESSION: Large 5 cm gallstone lodged in the gallbladder neck. Other smaller stones and sludge within the gallbladder with gallbladder distension. No wall thickening or sonographic Murphy sign. Fatty liver. Electronically Signed   By: Charlett Nose M.D.   On: 07/16/2022 09:27   DG Chest Portable 1 View  Result Date: 07/16/2022 CLINICAL DATA:  Abdominal pain with nausea and vomiting. History of metastatic lung cancer. EXAM: PORTABLE CHEST 1 VIEW COMPARISON:  CT chest from yesterday. FINDINGS: Unchanged right chest wall port catheter. The heart size and mediastinal contours are within normal limits. Normal pulmonary vascularity. Small metastases in both lungs are not apparent by x-ray. No focal consolidation, pleural effusion, or pneumothorax. No acute osseous abnormality. IMPRESSION: 1. No active disease. Small bilateral pulmonary metastases are not apparent by x-ray. Electronically Signed   By: Obie Dredge M.D.   On: 07/16/2022 09:02    Anti-infectives: Anti-infectives (From admission, onward)    Start     Dose/Rate Route Frequency Ordered Stop   07/16/22 1400  cefTRIAXone (ROCEPHIN) 2 g in sodium chloride 0.9 % 100 mL IVPB        2 g 200 mL/hr over 30 Minutes Intravenous Every 24 hours 07/16/22 1330          Assessment/Plan Nausea, cholelithiasis -unclear that her nausea is secondary to her gallstones -tolerating a solid diet with no further nausea.  She has never had abdominal pain -no plans for lap chole.  She can follow up if she begins to have biliary colic, but in the setting of her malignancy, isn't the best candidate for surgery. -stable for DC from our standpoint   FEN - carb mod VTE - per medicine ID - none needed  I reviewed hospitalist notes, last 24 h vitals and pain scores, last 48 h intake and output, last 24 h labs and trends, and last 24 h imaging results.   LOS: 0 days    Letha Cape ,  [facility] Surgery 07/17/2022, 10:34 AM Please see Amion for pager number during day hours 7:00am-4:30pm or 7:00am -11:30am on weekends

## 2022-07-17 NOTE — Discharge Summary (Signed)
Physician Discharge Summary   Patient: Carolyn Hooper MRN: [id number] DOB: 05-27-1960  Admit date:     07/16/2022  Discharge date: 07/17/22  Discharge Physician: Alba Cory   PCP: [name], FNP   Recommendations at discharge:   Follow up with Oncologist and PCP.   Discharge Diagnoses: Principal Problem:   Cholelithiasis  Resolved Problems:   * No resolved hospital problems. 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  She denies abdominal pain.  She was diagnosed with lung cancer November 2023 and is a status post 5 cycles of systemic chemotherapy.  She is a status post SRS to brain mets finished 6 cycle.  She reports fever 101.   She was found to have a lipase of 391, leukocytosis white blood cell 14 K, magnesium 1.4.  Chest x-ray and CT scan of the brain without acute finding.  Right upper quadrant ultrasound showed large 5 cm gallstone lodged in the gallbladder neck and order a smaller stone on his sludge within the gallbladder with gallbladder distention.    Assessment and Plan: 1-Nausea: Patient had episode of nausea prior to admission.  Episode has resolved.  Unclear if her nausea was related to gallstone.  She has been tolerated diet. She will be discharged on PPI She was evaluated by surgery who has cleared her for discharge. She denies abdominal pain, she had elevation of lipase of unclear etiology.  Cholelithiasis: Evaluated by general surgery unclear that the nausea was causing her stone.   Steroid induced diabetes, hyperglycemia: Resume home regimen  Stage IV lung cancer with brain mets: Continue to follow-up with Dr. Shirline Frees as an outpatient  Hypomagnesemia: Replace  Elevated lipase: Abdominal pain, she had an  episode of nausea which has resolved. CT scan negative for pancreatitis  Leukocytosis: resolved  afebrile.   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 Lower extremity edema:          Consultants: General Sx Procedures performed: None Disposition: Home Diet recommendation:  Discharge Diet Orders (From admission, onward)     Start     Ordered   07/17/22 0000  Diet Carb Modified        07/17/22 1023           Cardiac diet DISCHARGE MEDICATION: Allergies as of 07/17/2022       Reactions   Adhesive [tape] Dermatitis   Statins Other (See Comments)   Myalgia, itching        Medication List     STOP taking these medications    cephALEXin 500 MG capsule Commonly known as: KEFLEX       TAKE these medications    albuterol 108 (90 Base) MCG/ACT inhaler Commonly known as: VENTOLIN HFA Inhale 2 puffs into the lungs every 6 (six) hours as needed.   clonazePAM 0.5 MG tablet Commonly known as: KLONOPIN Take 1/2 tablet (0.25 mg) by mouth at bedtime. For anxiety   dexamethasone 1 MG tablet Commonly known as: DECADRON Take 3 tablets (3 mg total) by mouth daily with breakfast.   fluconazole 150 MG tablet Commonly known as: DIFLUCAN Take 1 tablet (150 mg) by mouth every 3 days as needed.   folic acid 1 MG tablet Commonly known as: FOLVITE Take 1 tablet (1 mg total) by mouth daily.   furosemide 20 MG tablet Commonly known as:  LASIX Take 1 tablet (20 mg total) by mouth daily as needed.   HYDROcodone-acetaminophen 5-325 MG tablet Commonly known as: Norco Take 1 - 2 tablets by mouth every 6 (six) hours as needed for moderate pain.   Lantus SoloStar 100 UNIT/ML Solostar Pen Generic drug: insulin glargine Inject 8 Units into the skin daily.   levETIRAcetam 1000 MG tablet Commonly known as: KEPPRA Take 1 tablet (1,000 mg total) by mouth 2 (two) times daily.   lidocaine 5 % ointment Commonly known as: XYLOCAINE Apply topically as needed.   lidocaine-prilocaine  cream Commonly known as: EMLA Apply to port prior to access   magic mouthwash (nystatin, diphenhydrAMINE, alum & mag hydroxide) suspension mixture Take 5 mLs by mouth 3 (three) times daily as needed for mouth pain.   metFORMIN 1000 MG tablet Commonly known as: GLUCOPHAGE Take 1 tablet (1,000 mg total) by mouth 2 (two) times daily.   ondansetron 4 MG tablet Commonly known as: ZOFRAN Take 1 tablet (4 mg total) by mouth every 6 (six) hours as needed for nausea. What changed: Another medication with the same name was removed. Continue taking this medication, and follow the directions you see here.   pantoprazole 40 MG tablet Commonly known as: PROTONIX Take 1 tablet (40 mg total) by mouth 2 (two) times daily.   prochlorperazine 10 MG tablet Commonly known as: COMPAZINE Take 1 tablet (10 mg total) by mouth every 6 (six) hours as needed.   TechLite Pen Needles 32G X 4 MM Misc Generic drug: Insulin Pen Needle Use to inject insulin daily as directed.        Discharge Exam: Filed Weights   07/16/22 0841  Weight: 110.8 kg   General; NAD  Condition at discharge: stable  The results of significant diagnostics from this hospitalization (including imaging, microbiology, ancillary and laboratory) are listed below for reference.   Imaging Studies: CT Head Wo Contrast  Result Date: 07/16/2022 CLINICAL DATA:  Headache EXAM: CT HEAD WITHOUT CONTRAST TECHNIQUE: Contiguous axial images were obtained from the base of the skull through the vertex without intravenous contrast. RADIATION DOSE REDUCTION: This exam was performed according to the departmental dose-optimization program which includes automated exposure control, adjustment of the mA and/or kV according to patient size and/or use of iterative reconstruction technique. COMPARISON:  Brain MRI 06/06/2022. FINDINGS: Brain: There is no evidence of acute intracranial hemorrhage, extra-axial fluid collection, or acute infarct Hypodensity in  the left precentral gyrus corresponds to the known metastatic lesion and surrounding vasogenic edema, grossly similar to the prior MRI from 06/06/2022, allowing for differences in modality. There is no significant regional mass effect. The smaller lesion in the right parietal cortex near the vertex is not well seen on the current study. Parenchymal volume is normal. The ventricles are stable and normal in size. The pituitary and suprasellar region are normal. There is no midline shift. The known right IAC vestibular schwannoma is not seen on the current study. Vascular: No hyperdense vessel or unexpected calcification. Skull: Normal. Negative for fracture or focal lesion. Sinuses/Orbits: The imaged paranasal sinuses are clear. The globes and orbits are unremarkable. Other: None. IMPRESSION: 1. No acute intracranial pathology. 2. Hypodensity in the left precentral gyrus corresponding to the known metastatic lesion and surrounding vasogenic edema, grossly similar to the prior MRI from 06/05/2022, allowing for differences in modality. Electronically Signed   By: Lesia Hausen M.D.   On: 07/16/2022 11:57   CT Chest W Contrast  Result Date: 07/16/2022 CLINICAL DATA:  Restaging  non-small cell lung cancer * Tracking Code: BO * EXAM: CT CHEST, ABDOMEN, AND PELVIS WITH CONTRAST TECHNIQUE: Multidetector CT imaging of the chest, abdomen and pelvis was performed following the standard protocol during bolus administration of intravenous contrast. RADIATION DOSE REDUCTION: This exam was performed according to the departmental dose-optimization program which includes automated exposure control, adjustment of the mA and/or kV according to patient size and/or use of iterative reconstruction technique. CONTRAST:  OMNIPAQUE IOHEXOL 300 MG/ML  SOLN COMPARISON:  Multiple exams, including 05/13/2022 FINDINGS: CT CHEST FINDINGS Cardiovascular: Right Port-A-Cath tip: SVC. Coronary, aortic arch, and branch vessel atherosclerotic  vascular disease. Mediastinum/Nodes: Mild and mildly worsened thoracic adenopathy. Left paratracheal node 1.2 cm in short axis on image 25 series 504, previously 0.9 cm. Right hilar node 1.1 cm in short axis on image 29 series 504, previously 0.8 cm. Two adjacent infrahilar nodes on the right side are mildly enlarged at 1.0 cm in diameter each on image 35 series 500 for, previously 0.9 cm. Lungs/Pleura: Left lower lobe mass 6.4 by 4.1 by 5.4 cm (volume = 74 cm^3), previously 5.5 by 4.0 by 4.7 cm (volume = 54 cm^3). This mass abuts the medial pleural margin and there is a trace pleural effusion 1, nonspecific for malignancy. There are few scattered bilateral pulmonary nodules which are stable to mildly increased. For example a superior segment right lower lobe nodule on image 63 series 507 measures 9 by 8 by 7 mm, and previously measured 8 by 7 by 6 mm. No definite new nodules identified. These nodules likely reflect metastatic disease. Musculoskeletal: Thoracic spondylosis. No compelling findings of thoracic skeletal metastatic disease. CT ABDOMEN PELVIS FINDINGS Hepatobiliary: Hypodensity compatible with benign steatosis in segment 4 of the liver posteriorly. This warrants no further workup. There is also some steatosis medially in segment 4b of the liver adjacent to the falciform ligament. Large gallstone in the gallbladder measuring up to 4.8 cm. Distal to this stone within the gallbladder there is diffuse homogeneous accentuated density probably reflecting concentrated sludge distal to the stone; bile proximal to the stone is of normal fluid density. Pancreas: Better seen than on prior exams due to loss motion artifact today, there is a 6 mm fluid density lesion in the pancreatic head on image 74 of series 500 for which is probably a tiny cystic lesion. Possibilities include a small postinflammatory cystic lesion or tiny indolent intraductal papillary mucinous neoplasm. Typical guidelines would call for follow  up pancreatic protocol MRI with and without contrast in 1 years time. However, in this clinical context, satisfactory surveillance imaging can probably be achieved with anticipated follow up oncology CT imaging of the abdomen related to the patient's metastatic thoracic malignancy. Spleen: Unremarkable Adrenals/Urinary Tract: Unremarkable Stomach/Bowel: Unremarkable Vascular/Lymphatic: Atherosclerosis is present, including aortoiliac atherosclerotic disease. Aortocaval lymph node 0.7 cm in short axis on image 87 series 504, within normal limits. No overtly pathologic adenopathy is identified in the abdomen or pelvis. Reproductive: Uterus absent.  Adnexa unremarkable. Other: Low-level subcutaneous edema particularly laterally and posteriorly in the hip region. Musculoskeletal: Bridging degenerative facet spurring at L5-S1 especially on the right. Lumbar spondylosis and degenerative disc disease contribute to impingement most notably at L3-4, L4-5, and L5-S1. Low position of the anorectal junction compatible with pelvic floor laxity. Sclerotic lesion with lobulated borders the lateral margin of the left femoral head and neck on image 94 series 505, no change from 02/12/2022, favor benign etiology such as bone island or densely calcified enchondroma over metastatic lesion. IMPRESSION: 1.  The dominant left lower lobe mass has a volume of 74 cubic cm, previously 54 cubic cm. This mass abuts the medial pleural margin and there is a trace left pleural effusion. 2. Scattered bilateral pulmonary nodules are stable to mildly increased in size, compatible with metastatic disease. 3. Mild and mildly worsened right hilar and left paratracheal adenopathy. 4. No findings of metastatic disease to the abdomen/pelvis or skeleton. 5. 6 mm fluid density lesion in the pancreatic head, probably a tiny cystic lesion. Possibilities include a small postinflammatory cystic lesion or tiny indolent intraductal papillary mucinous neoplasm.  Typical guidelines would call for follow up pancreatic protocol MRI with and without contrast in 1 years time. However, in this clinical context, satisfactory surveillance imaging can probably be achieved with anticipated follow up oncology CT imaging of the abdomen related to the patient's metastatic thoracic malignancy. 6. Other imaging findings of potential clinical significance: Coronary, aortic arch, and branch vessel atherosclerotic vascular disease. Cholelithiasis. Low position of the anorectal junction compatible with pelvic floor laxity. Lumbar spondylosis and degenerative disc disease causing impingement most notably at L3-4, L4-5, and L5-S1. 7. Low-level subcutaneous edema laterally and posteriorly in the hip region. 8. Aortic atherosclerosis. Aortic Atherosclerosis (ICD10-I70.0). Electronically Signed   By: Gaylyn Rong M.D.   On: 07/16/2022 11:33   CT Abdomen Pelvis W Contrast  Result Date: 07/16/2022 CLINICAL DATA:  Restaging non-small cell lung cancer * Tracking Code: BO * EXAM: CT CHEST, ABDOMEN, AND PELVIS WITH CONTRAST TECHNIQUE: Multidetector CT imaging of the chest, abdomen and pelvis was performed following the standard protocol during bolus administration of intravenous contrast. RADIATION DOSE REDUCTION: This exam was performed according to the departmental dose-optimization program which includes automated exposure control, adjustment of the mA and/or kV according to patient size and/or use of iterative reconstruction technique. CONTRAST:  OMNIPAQUE IOHEXOL 300 MG/ML  SOLN COMPARISON:  Multiple exams, including 05/13/2022 FINDINGS: CT CHEST FINDINGS Cardiovascular: Right Port-A-Cath tip: SVC. Coronary, aortic arch, and branch vessel atherosclerotic vascular disease. Mediastinum/Nodes: Mild and mildly worsened thoracic adenopathy. Left paratracheal node 1.2 cm in short axis on image 25 series 504, previously 0.9 cm. Right hilar node 1.1 cm in short axis on image 29 series 504,  previously 0.8 cm. Two adjacent infrahilar nodes on the right side are mildly enlarged at 1.0 cm in diameter each on image 35 series 500 for, previously 0.9 cm. Lungs/Pleura: Left lower lobe mass 6.4 by 4.1 by 5.4 cm (volume = 74 cm^3), previously 5.5 by 4.0 by 4.7 cm (volume = 54 cm^3). This mass abuts the medial pleural margin and there is a trace pleural effusion 1, nonspecific for malignancy. There are few scattered bilateral pulmonary nodules which are stable to mildly increased. For example a superior segment right lower lobe nodule on image 63 series 507 measures 9 by 8 by 7 mm, and previously measured 8 by 7 by 6 mm. No definite new nodules identified. These nodules likely reflect metastatic disease. Musculoskeletal: Thoracic spondylosis. No compelling findings of thoracic skeletal metastatic disease. CT ABDOMEN PELVIS FINDINGS Hepatobiliary: Hypodensity compatible with benign steatosis in segment 4 of the liver posteriorly. This warrants no further workup. There is also some steatosis medially in segment 4b of the liver adjacent to the falciform ligament. Large gallstone in the gallbladder measuring up to 4.8 cm. Distal to this stone within the gallbladder there is diffuse homogeneous accentuated density probably reflecting concentrated sludge distal to the stone; bile proximal to the stone is of normal fluid density. Pancreas: Better  seen than on prior exams due to loss motion artifact today, there is a 6 mm fluid density lesion in the pancreatic head on image 74 of series 500 for which is probably a tiny cystic lesion. Possibilities include a small postinflammatory cystic lesion or tiny indolent intraductal papillary mucinous neoplasm. Typical guidelines would call for follow up pancreatic protocol MRI with and without contrast in 1 years time. However, in this clinical context, satisfactory surveillance imaging can probably be achieved with anticipated follow up oncology CT imaging of the abdomen related  to the patient's metastatic thoracic malignancy. Spleen: Unremarkable Adrenals/Urinary Tract: Unremarkable Stomach/Bowel: Unremarkable Vascular/Lymphatic: Atherosclerosis is present, including aortoiliac atherosclerotic disease. Aortocaval lymph node 0.7 cm in short axis on image 87 series 504, within normal limits. No overtly pathologic adenopathy is identified in the abdomen or pelvis. Reproductive: Uterus absent.  Adnexa unremarkable. Other: Low-level subcutaneous edema particularly laterally and posteriorly in the hip region. Musculoskeletal: Bridging degenerative facet spurring at L5-S1 especially on the right. Lumbar spondylosis and degenerative disc disease contribute to impingement most notably at L3-4, L4-5, and L5-S1. Low position of the anorectal junction compatible with pelvic floor laxity. Sclerotic lesion with lobulated borders the lateral margin of the left femoral head and neck on image 94 series 505, no change from 02/12/2022, favor benign etiology such as bone island or densely calcified enchondroma over metastatic lesion. IMPRESSION: 1. The dominant left lower lobe mass has a volume of 74 cubic cm, previously 54 cubic cm. This mass abuts the medial pleural margin and there is a trace left pleural effusion. 2. Scattered bilateral pulmonary nodules are stable to mildly increased in size, compatible with metastatic disease. 3. Mild and mildly worsened right hilar and left paratracheal adenopathy. 4. No findings of metastatic disease to the abdomen/pelvis or skeleton. 5. 6 mm fluid density lesion in the pancreatic head, probably a tiny cystic lesion. Possibilities include a small postinflammatory cystic lesion or tiny indolent intraductal papillary mucinous neoplasm. Typical guidelines would call for follow up pancreatic protocol MRI with and without contrast in 1 years time. However, in this clinical context, satisfactory surveillance imaging can probably be achieved with anticipated follow up  oncology CT imaging of the abdomen related to the patient's metastatic thoracic malignancy. 6. Other imaging findings of potential clinical significance: Coronary, aortic arch, and branch vessel atherosclerotic vascular disease. Cholelithiasis. Low position of the anorectal junction compatible with pelvic floor laxity. Lumbar spondylosis and degenerative disc disease causing impingement most notably at L3-4, L4-5, and L5-S1. 7. Low-level subcutaneous edema laterally and posteriorly in the hip region. 8. Aortic atherosclerosis. Aortic Atherosclerosis (ICD10-I70.0). Electronically Signed   By: Gaylyn Rong M.D.   On: 07/16/2022 11:33   US Abdomen Limited RUQ (LIVER/GB)  Result Date: 07/16/2022 CLINICAL DATA:  Right upper quadrant pain EXAM: ULTRASOUND ABDOMEN LIMITED RIGHT UPPER QUADRANT COMPARISON:  CT 07/15/2022 FINDINGS: Gallbladder: Gallbladder is distended. 5 cm gallstone lodged in the gallbladder neck. Small stones and sludge also seen within the gallbladder. No wall thickening or sonographic Murphy sign. Common bile duct: Diameter: Normal caliber, 4 mm Liver: Coarsened, increased echotexture compatible with fatty infiltration. No focal abnormality. Portal vein is patent on color Doppler imaging with normal direction of blood flow towards the liver. Other: None. IMPRESSION: Large 5 cm gallstone lodged in the gallbladder neck. Other smaller stones and sludge within the gallbladder with gallbladder distension. No wall thickening or sonographic Murphy sign. Fatty liver. Electronically Signed   By: Charlett Nose M.D.   On: 07/16/2022 09:27  DG Chest Portable 1 View  Result Date: 07/16/2022 CLINICAL DATA:  Abdominal pain with nausea and vomiting. History of metastatic lung cancer. EXAM: PORTABLE CHEST 1 VIEW COMPARISON:  CT chest from yesterday. FINDINGS: Unchanged right chest wall port catheter. The heart size and mediastinal contours are within normal limits. Normal pulmonary vascularity. Small  metastases in both lungs are not apparent by x-ray. No focal consolidation, pleural effusion, or pneumothorax. No acute osseous abnormality. IMPRESSION: 1. No active disease. Small bilateral pulmonary metastases are not apparent by x-ray. Electronically Signed   By: Obie Dredge M.D.   On: 07/16/2022 09:02    Microbiology: Results for orders placed or performed during the hospital encounter of 07/16/22  Resp panel by RT-PCR (RSV, Flu A&B, Covid) Anterior Nasal Swab     Status: None   Collection Time: 07/16/22  9:04 AM   Specimen: Anterior Nasal Swab  Result Value Ref Range Status   SARS Coronavirus 2 by RT PCR NEGATIVE NEGATIVE Final    Comment: (NOTE) SARS-CoV-2 target nucleic acids are NOT DETECTED.  The SARS-CoV-2 RNA is generally detectable in upper respiratory specimens during the acute phase of infection. The lowest concentration of SARS-CoV-2 viral copies this assay can detect is 138 copies/mL. A negative result does not preclude SARS-Cov-2 infection and should not be used as the sole basis for treatment or other patient management decisions. A negative result may occur with  improper specimen collection/handling, submission of specimen other than nasopharyngeal swab, presence of viral mutation(s) within the areas targeted by this assay, and inadequate number of viral copies(<138 copies/mL). A negative result must be combined with clinical observations, patient history, and epidemiological information. The expected result is Negative.  Fact Sheet for Patients:  BloggerCourse.com  Fact Sheet for Healthcare Providers:  SeriousBroker.it  This test is no t yet approved or cleared by the [place] FDA and  has been authorized for detection and/or diagnosis of SARS-CoV-2 by FDA under an Emergency Use Authorization (EUA). This EUA will remain  in effect (meaning this test can be used) for the duration of the COVID-19  declaration under Section 564(b)(1) of the Act, 21 U.S.C.section 360bbb-3(b)(1), unless the authorization is terminated  or revoked sooner.       Influenza A by PCR NEGATIVE NEGATIVE Final   Influenza B by PCR NEGATIVE NEGATIVE Final    Comment: (NOTE) The Xpert Xpress SARS-CoV-2/FLU/RSV plus assay is intended as an aid in the diagnosis of influenza from Nasopharyngeal swab specimens and should not be used as a sole basis for treatment. Nasal washings and aspirates are unacceptable for Xpert Xpress SARS-CoV-2/FLU/RSV testing.  Fact Sheet for Patients: BloggerCourse.com  Fact Sheet for Healthcare Providers: SeriousBroker.it  This test is not yet approved or cleared by the [place] FDA and has been authorized for detection and/or diagnosis of SARS-CoV-2 by FDA under an Emergency Use Authorization (EUA). This EUA will remain in effect (meaning this test can be used) for the duration of the COVID-19 declaration under Section 564(b)(1) of the Act, 21 U.S.C. section 360bbb-3(b)(1), unless the authorization is terminated or revoked.     Resp Syncytial Virus by PCR NEGATIVE NEGATIVE Final    Comment: (NOTE) Fact Sheet for Patients: BloggerCourse.com  Fact Sheet for Healthcare Providers: SeriousBroker.it  This test is not yet approved or cleared by the [place] FDA and has been authorized for detection and/or diagnosis of SARS-CoV-2 by FDA under an Emergency Use Authorization (EUA). This EUA will remain in effect (meaning this test can be  used) for the duration of the COVID-19 declaration under Section 564(b)(1) of the Act, 21 U.S.C. section 360bbb-3(b)(1), unless the authorization is terminated or revoked.  Performed at [facility], [address], [place], [place] [address]     Labs: CBC: Recent Labs  Lab 07/16/22 0904 07/17/22 0427  WBC  14.0* 9.8  NEUTROABS 11.4*  --   HGB 12.6 10.5*  HCT 39.2 33.6*  MCV 102.6* 103.7*  PLT 185 158   Basic Metabolic Panel: Recent Labs  Lab 07/12/22 1114 07/16/22 0904 07/17/22 0427  NA 141 139 136  K 3.7 3.6 3.3*  CL 101 103 101  CO2 18* 23 27  GLUCOSE 289* 253* 180*  BUN 11 9 10   CREATININE 0.67 0.76 0.60  CALCIUM 9.2 9.2 8.1*  MG  --  1.4* 1.7   Liver Function Tests: Recent Labs  Lab 07/12/22 1114 07/16/22 0904 07/17/22 0427  AST 32 24 14*  ALT 84* 56* 39  ALKPHOS 71 57 46  BILITOT 0.4 0.7 0.5  PROT 5.9* 6.5 5.6*  ALBUMIN 3.8* 3.3* 2.8*   CBG: Recent Labs  Lab 07/16/22 1626 07/16/22 2110 07/17/22 0723  GLUCAP 312* 247* 152*    Discharge time spent: greater than 30 minutes.  Signed: Alba Cory, MD [facility] 07/17/2022

## 2022-07-17 NOTE — Plan of Care (Signed)

## 2022-07-17 NOTE — Progress Notes (Signed)
Reviewed written d/c instructions w pt and all questions answered. She verbalized understanding. D/C via w/c w all belongings in stable condition. 

## 2022-07-18 ENCOUNTER — Telehealth: Payer: Self-pay | Admitting: Internal Medicine

## 2022-07-18 ENCOUNTER — Other Ambulatory Visit: Payer: Self-pay | Admitting: Physician Assistant

## 2022-07-18 ENCOUNTER — Telehealth: Payer: Self-pay | Admitting: Medical Oncology

## 2022-07-18 NOTE — Telephone Encounter (Signed)
Pt notified of next appt

## 2022-07-18 NOTE — Telephone Encounter (Signed)
Scheduled per 05/02 staff message, patient has been called and voicemail was left.

## 2022-07-21 NOTE — Progress Notes (Unsigned)
[facility] OFFICE PROGRESS NOTE  Carolyn Spencer, FNP [address] [place] [address]  DIAGNOSIS: Stage IV (T3, N0, M1b) non-small cell lung cancer, adenosquamous carcinoma presented with large medial left lower lobe lung mass with bilateral pulmonary nodules and metastatic disease to the brain diagnosed in November 2023   Detected Alteration(s) / Biomarker(s) Associated FDA-approved therapies  Clinical Trial Availability      % cfDNA or Amplification   ATM [id number] approved in other indication Olaparib, Talazoparib Yes         0.5%   ATM [id number] ND None None   . PD-L1 expression is negative  PRIOR THERAPY:  1) Status post SRS to brain metastasis completed March 26, 2022  2) Systemic chemotherapy with carboplatin for AUC of 5, Alimta 500 Mg/M2 and Keytruda 200 Mg IV every 3 weeks status post 6 cycles. First dose was given on March 06, 2022.  Starting from cycle #5, the patient started maintenance Alimta and Keytruda IV every 3 weeks. ***This was discontinued to to evidence of disease progression   CURRENT THERAPY: ***  INTERVAL HISTORY: Carolyn Hooper 62 y.o. female returns to clinic today for follow-up visit accompanied by ***.  The patient was last seen by Dr. Arbutus Ped a little over 3 weeks ago.  She was supposed to come in for cycle #7 last week but she was in the hospital after she presented on 4/30 for the chief complaint of nausea and vomiting.  She had just had underwent her restaging CT scan at that time.  Her labs showed hypomagnesemia and elevated lipase.  There were no radiographic findings for pancreatitis.  Her scan did show cholelithiasis which was question her that was the etiology of her nausea and vomiting.  Her symptoms improved and she was discharged on 07/17/2022.  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  Today she denies any fevers, chills, or night sweats unless it is a warm sleeping  environment.  Her breathing is okay and she may have dyspnea on exertion.  Cough?  Productive?  Would you take?  Denies any chest pain or hemoptysis.  She denies any more nausea or vomiting since being discharged.  Denies any diarrhea or constipation.  She continues to have slow speech since her metastatic disease to the brain.  She also has tremors.  She follows closely with neuro-oncology for history of metastatic disease to the brain.  She denies any headache or visual changes.  She recently had a restaging CT scan performed.  She is here today for evaluation and to review her scan results before starting cycle #7  MEDICAL HISTORY: Past Medical History:  Diagnosis Date   Abscess    hydroadenitis superativa   Anxiety    DM (diabetes mellitus) (HCC)    Hyperinsulinemia    Hypertension 1983   gestational - pre-eclampsia   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[facility])    Neuroma    audiotry neuroma   Seizure (HCC)    Vertigo     ALLERGIES:  is allergic to adhesive [tape] and statins.  MEDICATIONS:  Current Outpatient Medications  Medication Sig Dispense Refill   albuterol (VENTOLIN HFA) 108 (90 Base) MCG/ACT inhaler Inhale 2 puffs into the lungs every 6 (six) hours as needed. 18 g 2   clonazePAM (KLONOPIN) 0.5 MG tablet Take 1/2 tablet (0.25 mg) by mouth at bedtime. For anxiety 30 tablet 2   dexamethasone (DECADRON) 1 MG tablet  Take 3 tablets (3 mg total) by mouth daily with breakfast. 90 tablet 1   fluconazole (DIFLUCAN) 150 MG tablet Take 1 tablet (150 mg) by mouth every 3 days as needed. 3 tablet 0   folic acid (FOLVITE) 1 MG tablet Take 1 tablet (1 mg total) by mouth daily. 30 tablet 1   furosemide (LASIX) 20 MG tablet Take 1 tablet (20 mg total) by mouth daily as needed. 10 tablet 0   HYDROcodone-acetaminophen (NORCO) 5-325 MG tablet Take 1 - 2 tablets by mouth every 6 (six) hours as needed for moderate pain. 30 tablet 0   insulin glargine (LANTUS SOLOSTAR) 100 UNIT/ML Solostar Pen Inject  8 Units into the skin daily. 15 mL PRN   Insulin Pen Needle (PEN NEEDLES) 32G X 4 MM MISC Use to inject insulin daily as directed. 100 each 11   levETIRAcetam (KEPPRA) 1000 MG tablet Take 1 tablet (1,000 mg total) by mouth 2 (two) times daily. 60 tablet 1   lidocaine (XYLOCAINE) 5 % ointment Apply topically as needed. 60 g 1   lidocaine-prilocaine (EMLA) cream Apply to port prior to access 30 g 0   magic mouthwash (nystatin, diphenhydrAMINE, alum & mag hydroxide) suspension mixture Take 5 mLs by mouth 3 (three) times daily as needed for mouth pain. 240 mL 1   metFORMIN (GLUCOPHAGE) 1000 MG tablet Take 1 tablet (1,000 mg total) by mouth 2 (two) times daily. 180 tablet 1   ondansetron (ZOFRAN) 4 MG tablet Take 1 tablet (4 mg total) by mouth every 6 (six) hours as needed for nausea. 30 tablet 2   pantoprazole (PROTONIX) 40 MG tablet Take 1 tablet (40 mg total) by mouth 2 (two) times daily. 60 tablet 0   prochlorperazine (COMPAZINE) 10 MG tablet Take 1 tablet (10 mg total) by mouth every 6 (six) hours as needed. 30 tablet 2   No current facility-administered medications for this visit.    SURGICAL HISTORY:  Past Surgical History:  Procedure Laterality Date   ABDOMINAL HYSTERECTOMY  2006   remaining left ovary   BRONCHIAL BIOPSY  02/19/2022   Procedure: BRONCHIAL BIOPSIES;  Surgeon: Josephine Igo, DO;  Location: [facility];  Service: Pulmonary;;   BRONCHIAL BRUSHINGS  02/19/2022   Procedure: BRONCHIAL BRUSHINGS;  Surgeon: Josephine Igo, DO;  Location: [facility];  Service: Pulmonary;;   BRONCHIAL NEEDLE ASPIRATION BIOPSY  02/19/2022   Procedure: BRONCHIAL NEEDLE ASPIRATION BIOPSIES;  Surgeon: Josephine Igo, DO;  Location: [facility];  Service: Pulmonary;;   IR IMAGING GUIDED PORT INSERTION  03/29/2022   SHOULDER SURGERY Right 1960s    REVIEW OF SYSTEMS:   Review of Systems  Constitutional: Negative for appetite change, chills, fatigue, fever and unexpected weight change.  HENT:    Negative for mouth sores, nosebleeds, sore throat and trouble swallowing.   Eyes: Negative for eye problems and icterus.  Respiratory: Negative for cough, hemoptysis, shortness of breath and wheezing.   Cardiovascular: Negative for chest pain and leg swelling.  Gastrointestinal: Negative for abdominal pain, constipation, diarrhea, nausea and vomiting.  Genitourinary: Negative for bladder incontinence, difficulty urinating, dysuria, frequency and hematuria.   Musculoskeletal: Negative for back pain, gait problem, neck pain and neck stiffness.  Skin: Negative for itching and rash.  Neurological: Negative for dizziness, extremity weakness, gait problem, headaches, light-headedness and seizures.  Hematological: Negative for adenopathy. Does not bruise/bleed easily.  Psychiatric/Behavioral: Negative for confusion, depression and sleep disturbance. The patient is not nervous/anxious.     PHYSICAL EXAMINATION:  There  were no vitals taken for this visit.  ECOG PERFORMANCE STATUS: {CHL ONC ECOG [id number]  Physical Exam  Constitutional: Oriented to person, place, and time and well-developed, well-nourished, and in no distress. No distress.  HENT:  Head: Normocephalic and atraumatic.  Mouth/Throat: Oropharynx is clear and moist. No oropharyngeal exudate.  Eyes: Conjunctivae are normal. Right eye exhibits no discharge. Left eye exhibits no discharge. No scleral icterus.  Neck: Normal range of motion. Neck supple.  Cardiovascular: Normal rate, regular rhythm, normal heart sounds and intact distal pulses.   Pulmonary/Chest: Effort normal and breath sounds normal. No respiratory distress. No wheezes. No rales.  Abdominal: Soft. Bowel sounds are normal. Exhibits no distension and no mass. There is no tenderness.  Musculoskeletal: Normal range of motion. Exhibits no edema.  Lymphadenopathy:    No cervical adenopathy.  Neurological: Alert and oriented to person, place, and time. Exhibits normal  muscle tone. Gait normal. Coordination normal.  Skin: Skin is warm and dry. No rash noted. Not diaphoretic. No erythema. No pallor.  Psychiatric: Mood, memory and judgment normal.  Vitals reviewed.  LABORATORY DATA: Lab Results  Component Value Date   WBC 9.8 07/17/2022   HGB 10.5 (L) 07/17/2022   HCT 33.6 (L) 07/17/2022   MCV 103.7 (H) 07/17/2022   PLT 158 07/17/2022      Chemistry      Component Value Date/Time   NA 136 07/17/2022 0427   NA 141 07/12/2022 1114   K 3.3 (L) 07/17/2022 0427   CL 101 07/17/2022 0427   CO2 27 07/17/2022 0427   BUN 10 07/17/2022 0427   BUN 11 07/12/2022 1114   CREATININE 0.60 07/17/2022 0427   CREATININE 0.75 06/26/2022 1029      Component Value Date/Time   CALCIUM 8.1 (L) 07/17/2022 0427   ALKPHOS 46 07/17/2022 0427   AST 14 (L) 07/17/2022 0427   AST 22 06/26/2022 1029   ALT 39 07/17/2022 0427   ALT 76 (H) 06/26/2022 1029   BILITOT 0.5 07/17/2022 0427   BILITOT 0.4 07/12/2022 1114   BILITOT 0.6 06/26/2022 1029       RADIOGRAPHIC STUDIES:  CT Head Wo Contrast  Result Date: 07/16/2022 CLINICAL DATA:  Headache EXAM: CT HEAD WITHOUT CONTRAST TECHNIQUE: Contiguous axial images were obtained from the base of the skull through the vertex without intravenous contrast. RADIATION DOSE REDUCTION: This exam was performed according to the departmental dose-optimization program which includes automated exposure control, adjustment of the mA and/or kV according to patient size and/or use of iterative reconstruction technique. COMPARISON:  Brain MRI 06/06/2022. FINDINGS: Brain: There is no evidence of acute intracranial hemorrhage, extra-axial fluid collection, or acute infarct Hypodensity in the left precentral gyrus corresponds to the known metastatic lesion and surrounding vasogenic edema, grossly similar to the prior MRI from 06/06/2022, allowing for differences in modality. There is no significant regional mass effect. The smaller lesion in the right  parietal cortex near the vertex is not well seen on the current study. Parenchymal volume is normal. The ventricles are stable and normal in size. The pituitary and suprasellar region are normal. There is no midline shift. The known right IAC vestibular schwannoma is not seen on the current study. Vascular: No hyperdense vessel or unexpected calcification. Skull: Normal. Negative for fracture or focal lesion. Sinuses/Orbits: The imaged paranasal sinuses are clear. The globes and orbits are unremarkable. Other: None. IMPRESSION: 1. No acute intracranial pathology. 2. Hypodensity in the left precentral gyrus corresponding to the known metastatic lesion and surrounding  vasogenic edema, grossly similar to the prior MRI from 06/05/2022, allowing for differences in modality. Electronically Signed   By: Lesia Hausen M.D.   On: 07/16/2022 11:57   CT Chest W Contrast  Result Date: 07/16/2022 CLINICAL DATA:  Restaging non-small cell lung cancer * Tracking Code: BO * EXAM: CT CHEST, ABDOMEN, AND PELVIS WITH CONTRAST TECHNIQUE: Multidetector CT imaging of the chest, abdomen and pelvis was performed following the standard protocol during bolus administration of intravenous contrast. RADIATION DOSE REDUCTION: This exam was performed according to the departmental dose-optimization program which includes automated exposure control, adjustment of the mA and/or kV according to patient size and/or use of iterative reconstruction technique. CONTRAST:  OMNIPAQUE IOHEXOL 300 MG/ML  SOLN COMPARISON:  Multiple exams, including 05/13/2022 FINDINGS: CT CHEST FINDINGS Cardiovascular: Right Port-A-Cath tip: SVC. Coronary, aortic arch, and branch vessel atherosclerotic vascular disease. Mediastinum/Nodes: Mild and mildly worsened thoracic adenopathy. Left paratracheal node 1.2 cm in short axis on image 25 series 504, previously 0.9 cm. Right hilar node 1.1 cm in short axis on image 29 series 504, previously 0.8 cm. Two adjacent  infrahilar nodes on the right side are mildly enlarged at 1.0 cm in diameter each on image 35 series 500 for, previously 0.9 cm. Lungs/Pleura: Left lower lobe mass 6.4 by 4.1 by 5.4 cm (volume = 74 cm^3), previously 5.5 by 4.0 by 4.7 cm (volume = 54 cm^3). This mass abuts the medial pleural margin and there is a trace pleural effusion 1, nonspecific for malignancy. There are few scattered bilateral pulmonary nodules which are stable to mildly increased. For example a superior segment right lower lobe nodule on image 63 series 507 measures 9 by 8 by 7 mm, and previously measured 8 by 7 by 6 mm. No definite new nodules identified. These nodules likely reflect metastatic disease. Musculoskeletal: Thoracic spondylosis. No compelling findings of thoracic skeletal metastatic disease. CT ABDOMEN PELVIS FINDINGS Hepatobiliary: Hypodensity compatible with benign steatosis in segment 4 of the liver posteriorly. This warrants no further workup. There is also some steatosis medially in segment 4b of the liver adjacent to the falciform ligament. Large gallstone in the gallbladder measuring up to 4.8 cm. Distal to this stone within the gallbladder there is diffuse homogeneous accentuated density probably reflecting concentrated sludge distal to the stone; bile proximal to the stone is of normal fluid density. Pancreas: Better seen than on prior exams due to loss motion artifact today, there is a 6 mm fluid density lesion in the pancreatic head on image 74 of series 500 for which is probably a tiny cystic lesion. Possibilities include a small postinflammatory cystic lesion or tiny indolent intraductal papillary mucinous neoplasm. Typical guidelines would call for follow up pancreatic protocol MRI with and without contrast in 1 years time. However, in this clinical context, satisfactory surveillance imaging can probably be achieved with anticipated follow up oncology CT imaging of the abdomen related to the patient's metastatic  thoracic malignancy. Spleen: Unremarkable Adrenals/Urinary Tract: Unremarkable Stomach/Bowel: Unremarkable Vascular/Lymphatic: Atherosclerosis is present, including aortoiliac atherosclerotic disease. Aortocaval lymph node 0.7 cm in short axis on image 87 series 504, within normal limits. No overtly pathologic adenopathy is identified in the abdomen or pelvis. Reproductive: Uterus absent.  Adnexa unremarkable. Other: Low-level subcutaneous edema particularly laterally and posteriorly in the hip region. Musculoskeletal: Bridging degenerative facet spurring at L5-S1 especially on the right. Lumbar spondylosis and degenerative disc disease contribute to impingement most notably at L3-4, L4-5, and L5-S1. Low position of the anorectal junction compatible with  pelvic floor laxity. Sclerotic lesion with lobulated borders the lateral margin of the left femoral head and neck on image 94 series 505, no change from 02/12/2022, favor benign etiology such as bone island or densely calcified enchondroma over metastatic lesion. IMPRESSION: 1. The dominant left lower lobe mass has a volume of 74 cubic cm, previously 54 cubic cm. This mass abuts the medial pleural margin and there is a trace left pleural effusion. 2. Scattered bilateral pulmonary nodules are stable to mildly increased in size, compatible with metastatic disease. 3. Mild and mildly worsened right hilar and left paratracheal adenopathy. 4. No findings of metastatic disease to the abdomen/pelvis or skeleton. 5. 6 mm fluid density lesion in the pancreatic head, probably a tiny cystic lesion. Possibilities include a small postinflammatory cystic lesion or tiny indolent intraductal papillary mucinous neoplasm. Typical guidelines would call for follow up pancreatic protocol MRI with and without contrast in 1 years time. However, in this clinical context, satisfactory surveillance imaging can probably be achieved with anticipated follow up oncology CT imaging of the abdomen  related to the patient's metastatic thoracic malignancy. 6. Other imaging findings of potential clinical significance: Coronary, aortic arch, and branch vessel atherosclerotic vascular disease. Cholelithiasis. Low position of the anorectal junction compatible with pelvic floor laxity. Lumbar spondylosis and degenerative disc disease causing impingement most notably at L3-4, L4-5, and L5-S1. 7. Low-level subcutaneous edema laterally and posteriorly in the hip region. 8. Aortic atherosclerosis. Aortic Atherosclerosis (ICD10-I70.0). Electronically Signed   By: Gaylyn Rong M.D.   On: 07/16/2022 11:33   CT Abdomen Pelvis W Contrast  Result Date: 07/16/2022 CLINICAL DATA:  Restaging non-small cell lung cancer * Tracking Code: BO * EXAM: CT CHEST, ABDOMEN, AND PELVIS WITH CONTRAST TECHNIQUE: Multidetector CT imaging of the chest, abdomen and pelvis was performed following the standard protocol during bolus administration of intravenous contrast. RADIATION DOSE REDUCTION: This exam was performed according to the departmental dose-optimization program which includes automated exposure control, adjustment of the mA and/or kV according to patient size and/or use of iterative reconstruction technique. CONTRAST:  OMNIPAQUE IOHEXOL 300 MG/ML  SOLN COMPARISON:  Multiple exams, including 05/13/2022 FINDINGS: CT CHEST FINDINGS Cardiovascular: Right Port-A-Cath tip: SVC. Coronary, aortic arch, and branch vessel atherosclerotic vascular disease. Mediastinum/Nodes: Mild and mildly worsened thoracic adenopathy. Left paratracheal node 1.2 cm in short axis on image 25 series 504, previously 0.9 cm. Right hilar node 1.1 cm in short axis on image 29 series 504, previously 0.8 cm. Two adjacent infrahilar nodes on the right side are mildly enlarged at 1.0 cm in diameter each on image 35 series 500 for, previously 0.9 cm. Lungs/Pleura: Left lower lobe mass 6.4 by 4.1 by 5.4 cm (volume = 74 cm^3), previously 5.5 by 4.0 by 4.7  cm (volume = 54 cm^3). This mass abuts the medial pleural margin and there is a trace pleural effusion 1, nonspecific for malignancy. There are few scattered bilateral pulmonary nodules which are stable to mildly increased. For example a superior segment right lower lobe nodule on image 63 series 507 measures 9 by 8 by 7 mm, and previously measured 8 by 7 by 6 mm. No definite new nodules identified. These nodules likely reflect metastatic disease. Musculoskeletal: Thoracic spondylosis. No compelling findings of thoracic skeletal metastatic disease. CT ABDOMEN PELVIS FINDINGS Hepatobiliary: Hypodensity compatible with benign steatosis in segment 4 of the liver posteriorly. This warrants no further workup. There is also some steatosis medially in segment 4b of the liver adjacent to the falciform ligament.  Large gallstone in the gallbladder measuring up to 4.8 cm. Distal to this stone within the gallbladder there is diffuse homogeneous accentuated density probably reflecting concentrated sludge distal to the stone; bile proximal to the stone is of normal fluid density. Pancreas: Better seen than on prior exams due to loss motion artifact today, there is a 6 mm fluid density lesion in the pancreatic head on image 74 of series 500 for which is probably a tiny cystic lesion. Possibilities include a small postinflammatory cystic lesion or tiny indolent intraductal papillary mucinous neoplasm. Typical guidelines would call for follow up pancreatic protocol MRI with and without contrast in 1 years time. However, in this clinical context, satisfactory surveillance imaging can probably be achieved with anticipated follow up oncology CT imaging of the abdomen related to the patient's metastatic thoracic malignancy. Spleen: Unremarkable Adrenals/Urinary Tract: Unremarkable Stomach/Bowel: Unremarkable Vascular/Lymphatic: Atherosclerosis is present, including aortoiliac atherosclerotic disease. Aortocaval lymph node 0.7 cm in  short axis on image 87 series 504, within normal limits. No overtly pathologic adenopathy is identified in the abdomen or pelvis. Reproductive: Uterus absent.  Adnexa unremarkable. Other: Low-level subcutaneous edema particularly laterally and posteriorly in the hip region. Musculoskeletal: Bridging degenerative facet spurring at L5-S1 especially on the right. Lumbar spondylosis and degenerative disc disease contribute to impingement most notably at L3-4, L4-5, and L5-S1. Low position of the anorectal junction compatible with pelvic floor laxity. Sclerotic lesion with lobulated borders the lateral margin of the left femoral head and neck on image 94 series 505, no change from 02/12/2022, favor benign etiology such as bone island or densely calcified enchondroma over metastatic lesion. IMPRESSION: 1. The dominant left lower lobe mass has a volume of 74 cubic cm, previously 54 cubic cm. This mass abuts the medial pleural margin and there is a trace left pleural effusion. 2. Scattered bilateral pulmonary nodules are stable to mildly increased in size, compatible with metastatic disease. 3. Mild and mildly worsened right hilar and left paratracheal adenopathy. 4. No findings of metastatic disease to the abdomen/pelvis or skeleton. 5. 6 mm fluid density lesion in the pancreatic head, probably a tiny cystic lesion. Possibilities include a small postinflammatory cystic lesion or tiny indolent intraductal papillary mucinous neoplasm. Typical guidelines would call for follow up pancreatic protocol MRI with and without contrast in 1 years time. However, in this clinical context, satisfactory surveillance imaging can probably be achieved with anticipated follow up oncology CT imaging of the abdomen related to the patient's metastatic thoracic malignancy. 6. Other imaging findings of potential clinical significance: Coronary, aortic arch, and branch vessel atherosclerotic vascular disease. Cholelithiasis. Low position of the  anorectal junction compatible with pelvic floor laxity. Lumbar spondylosis and degenerative disc disease causing impingement most notably at L3-4, L4-5, and L5-S1. 7. Low-level subcutaneous edema laterally and posteriorly in the hip region. 8. Aortic atherosclerosis. Aortic Atherosclerosis (ICD10-I70.0). Electronically Signed   By: Gaylyn Rong M.D.   On: 07/16/2022 11:33   US Abdomen Limited RUQ (LIVER/GB)  Result Date: 07/16/2022 CLINICAL DATA:  Right upper quadrant pain EXAM: ULTRASOUND ABDOMEN LIMITED RIGHT UPPER QUADRANT COMPARISON:  CT 07/15/2022 FINDINGS: Gallbladder: Gallbladder is distended. 5 cm gallstone lodged in the gallbladder neck. Small stones and sludge also seen within the gallbladder. No wall thickening or sonographic Murphy sign. Common bile duct: Diameter: Normal caliber, 4 mm Liver: Coarsened, increased echotexture compatible with fatty infiltration. No focal abnormality. Portal vein is patent on color Doppler imaging with normal direction of blood flow towards the liver. Other: None. IMPRESSION: Large  5 cm gallstone lodged in the gallbladder neck. Other smaller stones and sludge within the gallbladder with gallbladder distension. No wall thickening or sonographic Murphy sign. Fatty liver. Electronically Signed   By: Charlett Nose M.D.   On: 07/16/2022 09:27   DG Chest Portable 1 View  Result Date: 07/16/2022 CLINICAL DATA:  Abdominal pain with nausea and vomiting. History of metastatic lung cancer. EXAM: PORTABLE CHEST 1 VIEW COMPARISON:  CT chest from yesterday. FINDINGS: Unchanged right chest wall port catheter. The heart size and mediastinal contours are within normal limits. Normal pulmonary vascularity. Small metastases in both lungs are not apparent by x-ray. No focal consolidation, pleural effusion, or pneumothorax. No acute osseous abnormality. IMPRESSION: 1. No active disease. Small bilateral pulmonary metastases are not apparent by x-ray. Electronically Signed   By:  Obie Dredge M.D.   On: 07/16/2022 09:02     ASSESSMENT/PLAN:  This is a very pleasant 62 year old Caucasian female diagnosed with stage IV (T3, N0, M1 B) non-small cell lung cancer, adenosquamous carcinoma.  She presented with a large medial left lower lobe lung mass with bilateral pulmonary nodules metastatic disease to the brain.  She was diagnosed in November 2023. She does not have any actionable mutations.    She completed SRS to the metastatic brain lesions on 03/26/2022.   The patient had developed seizures and was seen in the emergency room.  Her dose of Keppra was increased and she was also put on higher dose of Decadron.  She underwent 1 cycle of systemic chemotherapy and immunotherapy with carboplatin for an AUC of 5, Alimta 500 mg/m, Keytruda 20 mg IV every 3 weeks.  She is status post 6 cycles. Starting from cycle #5, she started maintenance alimta and [place].   She recently had a restaging CT scan performed. The patient was seen with Dr. Arbutus Ped. Dr. Arbutus Ped personally and independently reviewed the scan and discussed the results with the patient. The scan showed ***disease progression with *  Dr. Arbutus Ped Discussion with the patient today about her current condition and recommended treatment options.  He did talk to her about palliative care and hospice versus second and third line chemotherapy with docetaxel and Cyramza and third line chemotherapy with gemcitabine.  The patient is interested in pursuing chemotherapy with docetaxel and Cyramza.  She will arrange for her first infusion next week on 07/29/2022.  The adverse side effects of treatment were discussed including not limited to alopecia, peripheral neuropathy, nausea, vomiting, kidney, liver dysfunction, diarrhea, constipation, risk of infection.  Discussed this regimen uses Neulasta.  She was encouraged to take Claritin 1 tablet p.o. daily for 4 to 7 days after receiving the Neulasta which is given on day 3 of every  cycle.  We will see her back for follow-up visit in 4 weeks for evaluation before starting cycle #2.  I sent prescription for Decadron to take 2 tablets twice a day the day before, the day of, and the day after chemotherapy.  Let her know she can discontinue taking her folic acid.  She will need to monitor her blood sugar closely while taking Decadron.  She will continue to follow with neuro-oncology regarding her history of metastatic disease to the brain.  The patient was advised to call immediately if she has any concerning symptoms in the interval. The patient voices understanding of current disease status and treatment options and is in agreement with the current care plan. All questions were answered. The patient knows to call the clinic with any  problems, questions or concerns. We can certainly see the patient much sooner if necessary   No orders of the defined types were placed in this encounter.    I spent {CHL ONC TIME VISIT - QIHKV:[id number]} counseling the patient face to face. The total time spent in the appointment was {CHL ONC TIME VISIT - IEPPI:[id number]}.  Ravleen Ries L Dazani Norby, PA-C 07/21/22

## 2022-07-22 ENCOUNTER — Inpatient Hospital Stay: Payer: BC Managed Care – PPO | Attending: Physician Assistant

## 2022-07-22 ENCOUNTER — Other Ambulatory Visit: Payer: Self-pay | Admitting: Medical Oncology

## 2022-07-22 ENCOUNTER — Encounter: Payer: Self-pay | Admitting: Internal Medicine

## 2022-07-22 ENCOUNTER — Other Ambulatory Visit (HOSPITAL_COMMUNITY): Payer: Self-pay

## 2022-07-22 ENCOUNTER — Inpatient Hospital Stay (HOSPITAL_BASED_OUTPATIENT_CLINIC_OR_DEPARTMENT_OTHER): Payer: BC Managed Care – PPO | Admitting: Physician Assistant

## 2022-07-22 ENCOUNTER — Inpatient Hospital Stay: Payer: BC Managed Care – PPO

## 2022-07-22 VITALS — BP 118/83 | HR 93 | Temp 97.8°F | Resp 16 | Wt 243.5 lb

## 2022-07-22 DIAGNOSIS — C3492 Malignant neoplasm of unspecified part of left bronchus or lung: Secondary | ICD-10-CM

## 2022-07-22 DIAGNOSIS — Z95828 Presence of other vascular implants and grafts: Secondary | ICD-10-CM

## 2022-07-22 DIAGNOSIS — E876 Hypokalemia: Secondary | ICD-10-CM | POA: Insufficient documentation

## 2022-07-22 DIAGNOSIS — Z79899 Other long term (current) drug therapy: Secondary | ICD-10-CM | POA: Insufficient documentation

## 2022-07-22 DIAGNOSIS — C7931 Secondary malignant neoplasm of brain: Secondary | ICD-10-CM | POA: Insufficient documentation

## 2022-07-22 DIAGNOSIS — C3432 Malignant neoplasm of lower lobe, left bronchus or lung: Secondary | ICD-10-CM | POA: Insufficient documentation

## 2022-07-22 DIAGNOSIS — Z794 Long term (current) use of insulin: Secondary | ICD-10-CM | POA: Diagnosis not present

## 2022-07-22 DIAGNOSIS — Z5112 Encounter for antineoplastic immunotherapy: Secondary | ICD-10-CM

## 2022-07-22 DIAGNOSIS — Z87891 Personal history of nicotine dependence: Secondary | ICD-10-CM | POA: Insufficient documentation

## 2022-07-22 DIAGNOSIS — C3490 Malignant neoplasm of unspecified part of unspecified bronchus or lung: Secondary | ICD-10-CM

## 2022-07-22 DIAGNOSIS — Z5111 Encounter for antineoplastic chemotherapy: Secondary | ICD-10-CM

## 2022-07-22 DIAGNOSIS — Z7984 Long term (current) use of oral hypoglycemic drugs: Secondary | ICD-10-CM | POA: Insufficient documentation

## 2022-07-22 DIAGNOSIS — K1379 Other lesions of oral mucosa: Secondary | ICD-10-CM

## 2022-07-22 LAB — CMP (CANCER CENTER ONLY)
ALT: 24 U/L (ref 0–44)
AST: 14 U/L — ABNORMAL LOW (ref 15–41)
Albumin: 3.6 g/dL (ref 3.5–5.0)
Alkaline Phosphatase: 71 U/L (ref 38–126)
Anion gap: 10 (ref 5–15)
BUN: 12 mg/dL (ref 8–23)
CO2: 26 mmol/L (ref 22–32)
Calcium: 9.1 mg/dL (ref 8.9–10.3)
Chloride: 101 mmol/L (ref 98–111)
Creatinine: 1.12 mg/dL — ABNORMAL HIGH (ref 0.44–1.00)
GFR, Estimated: 56 mL/min — ABNORMAL LOW (ref >60)
Glucose, Bld: 191 mg/dL — ABNORMAL HIGH (ref 70–99)
Potassium: 3.8 mmol/L (ref 3.5–5.1)
Sodium: 137 mmol/L (ref 135–145)
Total Bilirubin: 0.7 mg/dL (ref 0.3–1.2)
Total Protein: 6.4 g/dL — ABNORMAL LOW (ref 6.5–8.1)

## 2022-07-22 LAB — CBC WITH DIFFERENTIAL (CANCER CENTER ONLY)
Abs Immature Granulocytes: 0.13 10*3/uL — ABNORMAL HIGH (ref 0.00–0.07)
Basophils Absolute: 0.1 10*3/uL (ref 0.0–0.1)
Basophils Relative: 1 %
Eosinophils Absolute: 2.3 10*3/uL — ABNORMAL HIGH (ref 0.0–0.5)
Eosinophils Relative: 16 %
HCT: 43.3 % (ref 36.0–46.0)
Hemoglobin: 14.2 g/dL (ref 12.0–15.0)
Immature Granulocytes: 1 %
Lymphocytes Relative: 13 %
Lymphs Abs: 1.9 10*3/uL (ref 0.7–4.0)
MCH: 33 pg (ref 26.0–34.0)
MCHC: 32.8 g/dL (ref 30.0–36.0)
MCV: 100.7 fL — ABNORMAL HIGH (ref 80.0–100.0)
Monocytes Absolute: 1.2 10*3/uL — ABNORMAL HIGH (ref 0.1–1.0)
Monocytes Relative: 9 %
Neutro Abs: 9 10*3/uL — ABNORMAL HIGH (ref 1.7–7.7)
Neutrophils Relative %: 60 %
Platelet Count: 178 10*3/uL (ref 150–400)
RBC: 4.3 MIL/uL (ref 3.87–5.11)
RDW: 15.1 % (ref 11.5–15.5)
WBC Count: 14.6 10*3/uL — ABNORMAL HIGH (ref 4.0–10.5)
nRBC: 0 % (ref 0.0–0.2)

## 2022-07-22 MED ORDER — SODIUM CHLORIDE 0.9 % IV SOLN
200.0000 mg | Freq: Once | INTRAVENOUS | Status: AC
  Administered 2022-07-22: 200 mg via INTRAVENOUS
  Filled 2022-07-22: qty 200

## 2022-07-22 MED ORDER — SODIUM CHLORIDE 0.9 % IV SOLN: Freq: Once | INTRAVENOUS | Status: AC

## 2022-07-22 MED ORDER — ALUM & MAG HYDROXIDE-SIMETH 200-200-20 MG/5ML PO SUSP
5.0000 mL | Freq: Three times a day (TID) | ORAL | 1 refills | Status: DC | PRN
Start: 2022-07-22 — End: 2022-08-22
  Filled 2022-07-22: qty 240, 16d supply, fill #0

## 2022-07-22 MED ORDER — SODIUM CHLORIDE 0.9 % IV SOLN
500.0000 mg/m2 | Freq: Once | INTRAVENOUS | Status: AC
  Administered 2022-07-22: 1100 mg via INTRAVENOUS
  Filled 2022-07-22: qty 40

## 2022-07-22 MED ORDER — PROCHLORPERAZINE MALEATE 10 MG PO TABS
10.0000 mg | ORAL_TABLET | Freq: Once | ORAL | Status: AC
  Administered 2022-07-22: 10 mg via ORAL
  Filled 2022-07-22: qty 1

## 2022-07-22 MED ORDER — SODIUM CHLORIDE 0.9% FLUSH
10.0000 mL | INTRAVENOUS | Status: DC | PRN
  Administered 2022-07-22: 10 mL

## 2022-07-22 MED ORDER — SODIUM CHLORIDE 0.9% FLUSH
10.0000 mL | Freq: Once | INTRAVENOUS | Status: AC
  Administered 2022-07-22: 10 mL

## 2022-07-22 MED ORDER — HEPARIN SOD (PORK) LOCK FLUSH 100 UNIT/ML IV SOLN
500.0000 [IU] | Freq: Once | INTRAVENOUS | Status: AC | PRN
  Administered 2022-07-22: 500 [IU]

## 2022-07-22 NOTE — Telephone Encounter (Signed)
Refill MMW - sees Cassie today.

## 2022-07-22 NOTE — Patient Instructions (Signed)
[facility]   Discharge Instructions: Thank you for choosing [facility] to provide your oncology and hematology care.   If you have a lab appointment with the Cancer Center, please go directly to the Cancer Center and check in at the registration area.   Wear comfortable clothing and clothing appropriate for easy access to any Portacath or PICC line.   We strive to give you quality time with your provider. You may need to reschedule your appointment if you arrive late (15 or more minutes).  Arriving late affects you and other patients whose appointments are after yours.  Also, if you miss three or more appointments without notifying the office, you may be dismissed from the clinic at the provider's discretion.      For prescription refill requests, have your pharmacy contact our office and allow 72 hours for refills to be completed.    Today you received the following chemotherapy and/or immunotherapy agents: Pembrolizumab (Keytruda) and Pemetrexed (Alimta)      To help prevent nausea and vomiting after your treatment, we encourage you to take your nausea medication as directed.  BELOW ARE SYMPTOMS THAT SHOULD BE REPORTED IMMEDIATELY: *FEVER GREATER THAN 100.4 F (38 C) OR HIGHER *CHILLS OR SWEATING *NAUSEA AND VOMITING THAT IS NOT CONTROLLED WITH YOUR NAUSEA MEDICATION *UNUSUAL SHORTNESS OF BREATH *UNUSUAL BRUISING OR BLEEDING *URINARY PROBLEMS (pain or burning when urinating, or frequent urination) *BOWEL PROBLEMS (unusual diarrhea, constipation, pain near the anus) TENDERNESS IN MOUTH AND THROAT WITH OR WITHOUT PRESENCE OF ULCERS (sore throat, sores in mouth, or a toothache) UNUSUAL RASH, SWELLING OR PAIN  UNUSUAL VAGINAL DISCHARGE OR ITCHING   Items with * indicate a potential emergency and should be followed up as soon as possible or go to the Emergency Department if any problems should occur.  Please show the CHEMOTHERAPY ALERT  CARD or IMMUNOTHERAPY ALERT CARD at check-in to the Emergency Department and triage nurse.  Should you have questions after your visit or need to cancel or reschedule your appointment, please contact [facility]  Dept: [phone]  and follow the prompts.  Office hours are 8:00 a.m. to 4:30 p.m. Monday - Friday. Please note that voicemails left after 4:00 p.m. may not be returned until the following business day.  We are closed weekends and major holidays. You have access to a nurse at all times for urgent questions. Please call the main number to the clinic Dept: [phone] and follow the prompts.   For any non-urgent questions, you may also contact your provider using MyChart. We now offer e-Visits for anyone 18 and older to request care online for non-urgent symptoms. For details visit [email or web address].   Also download the MyChart app! Go to the app store, search "MyChart", open the app, select [facility], and log in with your MyChart username and password. 

## 2022-07-24 ENCOUNTER — Telehealth: Payer: Self-pay | Admitting: Family

## 2022-07-24 ENCOUNTER — Telehealth: Payer: Self-pay

## 2022-07-24 DIAGNOSIS — F5101 Primary insomnia: Secondary | ICD-10-CM

## 2022-07-24 NOTE — Telephone Encounter (Signed)
I called and spoke with patient. She needs a sleep study she has never had a sleep study or Cpap machine. Please advise.

## 2022-07-24 NOTE — Telephone Encounter (Signed)
This nurse received a call from this patient stating that she has had one episode of vomiting last night after receiving chemo, she took a Zofran and it has been effective. However, she has been having a lot of coughing that is producing clear mucous and chills.  Patient denies having a fever.  This nurse advised that provider will be made aware and a return call will be made with any recommendations received.  No further questions or concerns noted at this time.

## 2022-07-24 NOTE — Telephone Encounter (Signed)
Pt needs order for CPAP Machine. Needs it sent to [facility] in [place] or [facility] pharmacy ASAP if possible.

## 2022-07-25 ENCOUNTER — Telehealth: Payer: Self-pay

## 2022-07-25 ENCOUNTER — Encounter: Payer: Self-pay | Admitting: Internal Medicine

## 2022-07-25 NOTE — Telephone Encounter (Signed)
Lmtcb.

## 2022-07-25 NOTE — Telephone Encounter (Signed)
Has patient been told she needs sleep study or has OSA? If not I would think she has enough going on right now that we can hold off on sleep study at this time. However, if she really wants study I can place order.   Jannifer Rodney, FNP

## 2022-07-25 NOTE — Telephone Encounter (Signed)
This nurse spoke with this patient today to see how she was feeling patient states that she had a low grade temp today of 99 states that she took some Tylenol, she has been have nausea and one episode of vomiting, this nurse asked has she taker her Zofran, patient denies taking it. This nurse encouraged patient to take Zofran for the nausea.  She also states that she has been coughing up clear mucous and wants to know if she can take over the counter cough medicine.  This nurse did recommend a cough medicine that does not have alcohol and that is sugar free due to her diabetes status to help with the cough.  Patient expressed concern about not getting enough hydration because she is sleeping a lot.  She states that she has a bottle of water by her bed that she has been sipping on.  This nurse recommended Liquid IV, patient stated she actually has some of that but she did not think to use it.  Patient states that she will try the suggestions and knows to call back if symptoms do not stop or get worse.  No further concerns noted at this time.

## 2022-07-26 ENCOUNTER — Emergency Department (HOSPITAL_COMMUNITY): Payer: BC Managed Care – PPO

## 2022-07-26 ENCOUNTER — Inpatient Hospital Stay (HOSPITAL_COMMUNITY)
Admission: EM | Admit: 2022-07-26 | Discharge: 2022-07-30 | DRG: 445 | Disposition: A | Discharge status: Home / Self Care | Payer: BC Managed Care – PPO | Attending: Family Medicine | Admitting: Family Medicine

## 2022-07-26 ENCOUNTER — Other Ambulatory Visit: Payer: Self-pay

## 2022-07-26 ENCOUNTER — Telehealth: Payer: Self-pay | Admitting: Medical Oncology

## 2022-07-26 ENCOUNTER — Observation Stay (HOSPITAL_COMMUNITY): Payer: BC Managed Care – PPO

## 2022-07-26 ENCOUNTER — Encounter (HOSPITAL_COMMUNITY): Payer: Self-pay | Admitting: Emergency Medicine

## 2022-07-26 DIAGNOSIS — K810 Acute cholecystitis: Secondary | ICD-10-CM | POA: Diagnosis present

## 2022-07-26 DIAGNOSIS — R21 Rash and other nonspecific skin eruption: Secondary | ICD-10-CM

## 2022-07-26 DIAGNOSIS — D696 Thrombocytopenia, unspecified: Secondary | ICD-10-CM | POA: Diagnosis present

## 2022-07-26 DIAGNOSIS — Z95828 Presence of other vascular implants and grafts: Secondary | ICD-10-CM | POA: Diagnosis not present

## 2022-07-26 DIAGNOSIS — E876 Hypokalemia: Secondary | ICD-10-CM | POA: Diagnosis present

## 2022-07-26 DIAGNOSIS — Z794 Long term (current) use of insulin: Secondary | ICD-10-CM | POA: Diagnosis not present

## 2022-07-26 DIAGNOSIS — Z8261 Family history of arthritis: Secondary | ICD-10-CM

## 2022-07-26 DIAGNOSIS — E860 Dehydration: Secondary | ICD-10-CM | POA: Diagnosis not present

## 2022-07-26 DIAGNOSIS — T380X5A Adverse effect of glucocorticoids and synthetic analogues, initial encounter: Secondary | ICD-10-CM | POA: Diagnosis present

## 2022-07-26 DIAGNOSIS — R110 Nausea: Secondary | ICD-10-CM | POA: Diagnosis not present

## 2022-07-26 DIAGNOSIS — K3189 Other diseases of stomach and duodenum: Secondary | ICD-10-CM | POA: Diagnosis not present

## 2022-07-26 DIAGNOSIS — F1320 Sedative, hypnotic or anxiolytic dependence, uncomplicated: Secondary | ICD-10-CM

## 2022-07-26 DIAGNOSIS — Z8249 Family history of ischemic heart disease and other diseases of the circulatory system: Secondary | ICD-10-CM

## 2022-07-26 DIAGNOSIS — D849 Immunodeficiency, unspecified: Secondary | ICD-10-CM | POA: Diagnosis not present

## 2022-07-26 DIAGNOSIS — K8000 Calculus of gallbladder with acute cholecystitis without obstruction: Secondary | ICD-10-CM | POA: Diagnosis not present

## 2022-07-26 DIAGNOSIS — I10 Essential (primary) hypertension: Secondary | ICD-10-CM | POA: Diagnosis present

## 2022-07-26 DIAGNOSIS — R1030 Lower abdominal pain, unspecified: Secondary | ICD-10-CM

## 2022-07-26 DIAGNOSIS — E1169 Type 2 diabetes mellitus with other specified complication: Secondary | ICD-10-CM | POA: Diagnosis not present

## 2022-07-26 DIAGNOSIS — T451X5A Adverse effect of antineoplastic and immunosuppressive drugs, initial encounter: Secondary | ICD-10-CM | POA: Diagnosis present

## 2022-07-26 DIAGNOSIS — E871 Hypo-osmolality and hyponatremia: Secondary | ICD-10-CM | POA: Diagnosis not present

## 2022-07-26 DIAGNOSIS — Z1152 Encounter for screening for COVID-19: Secondary | ICD-10-CM

## 2022-07-26 DIAGNOSIS — Z87891 Personal history of nicotine dependence: Secondary | ICD-10-CM | POA: Diagnosis not present

## 2022-07-26 DIAGNOSIS — C7931 Secondary malignant neoplasm of brain: Secondary | ICD-10-CM | POA: Diagnosis present

## 2022-07-26 DIAGNOSIS — Z79899 Other long term (current) drug therapy: Secondary | ICD-10-CM

## 2022-07-26 DIAGNOSIS — R509 Fever, unspecified: Principal | ICD-10-CM | POA: Diagnosis present

## 2022-07-26 DIAGNOSIS — R197 Diarrhea, unspecified: Secondary | ICD-10-CM

## 2022-07-26 DIAGNOSIS — Z841 Family history of disorders of kidney and ureter: Secondary | ICD-10-CM

## 2022-07-26 DIAGNOSIS — E785 Hyperlipidemia, unspecified: Secondary | ICD-10-CM | POA: Diagnosis present

## 2022-07-26 DIAGNOSIS — Z9109 Other allergy status, other than to drugs and biological substances: Secondary | ICD-10-CM

## 2022-07-26 DIAGNOSIS — F411 Generalized anxiety disorder: Secondary | ICD-10-CM

## 2022-07-26 DIAGNOSIS — L299 Pruritus, unspecified: Secondary | ICD-10-CM | POA: Diagnosis present

## 2022-07-26 DIAGNOSIS — E1165 Type 2 diabetes mellitus with hyperglycemia: Secondary | ICD-10-CM | POA: Diagnosis present

## 2022-07-26 DIAGNOSIS — G9389 Other specified disorders of brain: Secondary | ICD-10-CM | POA: Diagnosis not present

## 2022-07-26 DIAGNOSIS — Z7984 Long term (current) use of oral hypoglycemic drugs: Secondary | ICD-10-CM

## 2022-07-26 DIAGNOSIS — R5081 Fever presenting with conditions classified elsewhere: Secondary | ICD-10-CM | POA: Diagnosis not present

## 2022-07-26 DIAGNOSIS — R9431 Abnormal electrocardiogram [ECG] [EKG]: Secondary | ICD-10-CM | POA: Diagnosis not present

## 2022-07-26 DIAGNOSIS — D630 Anemia in neoplastic disease: Secondary | ICD-10-CM | POA: Diagnosis not present

## 2022-07-26 DIAGNOSIS — R531 Weakness: Secondary | ICD-10-CM | POA: Diagnosis not present

## 2022-07-26 DIAGNOSIS — C3490 Malignant neoplasm of unspecified part of unspecified bronchus or lung: Secondary | ICD-10-CM | POA: Diagnosis not present

## 2022-07-26 DIAGNOSIS — Z888 Allergy status to other drugs, medicaments and biological substances status: Secondary | ICD-10-CM

## 2022-07-26 DIAGNOSIS — F419 Anxiety disorder, unspecified: Secondary | ICD-10-CM | POA: Diagnosis present

## 2022-07-26 DIAGNOSIS — K8020 Calculus of gallbladder without cholecystitis without obstruction: Secondary | ICD-10-CM | POA: Diagnosis not present

## 2022-07-26 DIAGNOSIS — R059 Cough, unspecified: Secondary | ICD-10-CM | POA: Diagnosis not present

## 2022-07-26 DIAGNOSIS — Z825 Family history of asthma and other chronic lower respiratory diseases: Secondary | ICD-10-CM

## 2022-07-26 DIAGNOSIS — Z0389 Encounter for observation for other suspected diseases and conditions ruled out: Secondary | ICD-10-CM | POA: Diagnosis not present

## 2022-07-26 DIAGNOSIS — Z833 Family history of diabetes mellitus: Secondary | ICD-10-CM

## 2022-07-26 LAB — COMPREHENSIVE METABOLIC PANEL
ALT: 43 U/L (ref 0–44)
AST: 46 U/L — ABNORMAL HIGH (ref 15–41)
Albumin: 2.8 g/dL — ABNORMAL LOW (ref 3.5–5.0)
Alkaline Phosphatase: 62 U/L (ref 38–126)
Anion gap: 12 (ref 5–15)
BUN: 7 mg/dL — ABNORMAL LOW (ref 8–23)
CO2: 24 mmol/L (ref 22–32)
Calcium: 8.3 mg/dL — ABNORMAL LOW (ref 8.9–10.3)
Chloride: 98 mmol/L (ref 98–111)
Creatinine, Ser: 0.78 mg/dL (ref 0.44–1.00)
GFR, Estimated: >60 mL/min (ref >60)
Glucose, Bld: 178 mg/dL — ABNORMAL HIGH (ref 70–99)
Potassium: 3.3 mmol/L — ABNORMAL LOW (ref 3.5–5.1)
Sodium: 134 mmol/L — ABNORMAL LOW (ref 135–145)
Total Bilirubin: 1.7 mg/dL — ABNORMAL HIGH (ref 0.3–1.2)
Total Protein: 6.2 g/dL — ABNORMAL LOW (ref 6.5–8.1)

## 2022-07-26 LAB — URINALYSIS, ROUTINE W REFLEX MICROSCOPIC
Bilirubin Urine: NEGATIVE
Glucose, UA: NEGATIVE mg/dL
Hgb urine dipstick: NEGATIVE
Ketones, ur: 5 mg/dL — AB
Nitrite: NEGATIVE
Protein, ur: NEGATIVE mg/dL
Specific Gravity, Urine: 1.005 (ref 1.005–1.030)
Trans Epithel, UA: <1
pH: 6 (ref 5.0–8.0)

## 2022-07-26 LAB — CBC WITH DIFFERENTIAL/PLATELET
Abs Immature Granulocytes: 0.03 10*3/uL (ref 0.00–0.07)
Basophils Absolute: 0 10*3/uL (ref 0.0–0.1)
Basophils Relative: 0 %
Eosinophils Absolute: 1.3 10*3/uL — ABNORMAL HIGH (ref 0.0–0.5)
Eosinophils Relative: 16 %
HCT: 34.4 % — ABNORMAL LOW (ref 36.0–46.0)
Hemoglobin: 11 g/dL — ABNORMAL LOW (ref 12.0–15.0)
Immature Granulocytes: 0 %
Lymphocytes Relative: 11 %
Lymphs Abs: 0.8 10*3/uL (ref 0.7–4.0)
MCH: 31.9 pg (ref 26.0–34.0)
MCHC: 32 g/dL (ref 30.0–36.0)
MCV: 99.7 fL (ref 80.0–100.0)
Monocytes Absolute: 0.1 10*3/uL (ref 0.1–1.0)
Monocytes Relative: 1 %
Neutro Abs: 5.6 10*3/uL (ref 1.7–7.7)
Neutrophils Relative %: 72 %
Platelets: 154 10*3/uL (ref 150–400)
RBC: 3.45 MIL/uL — ABNORMAL LOW (ref 3.87–5.11)
RDW: 14.5 % (ref 11.5–15.5)
WBC: 7.9 10*3/uL (ref 4.0–10.5)
nRBC: 0 % (ref 0.0–0.2)

## 2022-07-26 LAB — GLUCOSE, CAPILLARY: Glucose-Capillary: 146 mg/dL — ABNORMAL HIGH (ref 70–99)

## 2022-07-26 LAB — RESPIRATORY PANEL BY PCR

## 2022-07-26 LAB — RESP PANEL BY RT-PCR (RSV, FLU A&B, COVID)  RVPGX2
Influenza A by PCR: NEGATIVE
Influenza B by PCR: NEGATIVE
Resp Syncytial Virus by PCR: NEGATIVE
SARS Coronavirus 2 by RT PCR: NEGATIVE

## 2022-07-26 LAB — PROCALCITONIN: Procalcitonin: 0.32 ng/mL

## 2022-07-26 LAB — LACTIC ACID, PLASMA
Lactic Acid, Venous: 1.3 mmol/L (ref 0.5–1.9)
Lactic Acid, Venous: 1.8 mmol/L (ref 0.5–1.9)

## 2022-07-26 LAB — CBG MONITORING, ED: Glucose-Capillary: 157 mg/dL — ABNORMAL HIGH (ref 70–99)

## 2022-07-26 MED ORDER — SODIUM CHLORIDE 0.9 % IV SOLN: INTRAVENOUS | Status: AC

## 2022-07-26 MED ORDER — HYDROCODONE-ACETAMINOPHEN 5-325 MG PO TABS: 1.0000 | ORAL_TABLET | Freq: Four times a day (QID) | ORAL | Status: DC | PRN

## 2022-07-26 MED ORDER — ACETAMINOPHEN 650 MG RE SUPP: 650.0000 mg | Freq: Four times a day (QID) | RECTAL | Status: DC | PRN

## 2022-07-26 MED ORDER — HYDROCORTISONE 1 % EX CREA
TOPICAL_CREAM | CUTANEOUS | Status: DC | PRN
  Filled 2022-07-26 (×2): qty 28

## 2022-07-26 MED ORDER — ACETAMINOPHEN 325 MG PO TABS
650.0000 mg | ORAL_TABLET | Freq: Four times a day (QID) | ORAL | Status: DC | PRN
  Administered 2022-07-26: 650 mg via ORAL
  Filled 2022-07-26: qty 2

## 2022-07-26 MED ORDER — FOLIC ACID 1 MG PO TABS
1.0000 mg | ORAL_TABLET | Freq: Every day | ORAL | Status: DC
  Administered 2022-07-26 – 2022-07-30 (×5): 1 mg via ORAL
  Filled 2022-07-26 (×5): qty 1

## 2022-07-26 MED ORDER — HYDRALAZINE HCL 20 MG/ML IJ SOLN: 5.0000 mg | INTRAMUSCULAR | Status: DC | PRN

## 2022-07-26 MED ORDER — INSULIN ASPART 100 UNIT/ML IJ SOLN
0.0000 [IU] | Freq: Three times a day (TID) | INTRAMUSCULAR | Status: DC
  Administered 2022-07-26 – 2022-07-28 (×5): 3 [IU] via SUBCUTANEOUS
  Administered 2022-07-28: 2 [IU] via SUBCUTANEOUS
  Administered 2022-07-28: 3 [IU] via SUBCUTANEOUS
  Administered 2022-07-29: 5 [IU] via SUBCUTANEOUS
  Administered 2022-07-29: 8 [IU] via SUBCUTANEOUS
  Administered 2022-07-29: 2 [IU] via SUBCUTANEOUS
  Administered 2022-07-30: 8 [IU] via SUBCUTANEOUS
  Filled 2022-07-26: qty 1

## 2022-07-26 MED ORDER — LEVETIRACETAM 500 MG PO TABS
1000.0000 mg | ORAL_TABLET | Freq: Two times a day (BID) | ORAL | Status: DC
  Administered 2022-07-26 – 2022-07-30 (×8): 1000 mg via ORAL
  Filled 2022-07-26 (×8): qty 2

## 2022-07-26 MED ORDER — POTASSIUM CHLORIDE CRYS ER 20 MEQ PO TBCR
40.0000 meq | EXTENDED_RELEASE_TABLET | Freq: Once | ORAL | Status: AC
  Administered 2022-07-26: 40 meq via ORAL
  Filled 2022-07-26: qty 2

## 2022-07-26 MED ORDER — OXYCODONE HCL 5 MG PO TABS: 5.0000 mg | ORAL_TABLET | ORAL | Status: DC | PRN

## 2022-07-26 MED ORDER — ONDANSETRON HCL 4 MG PO TABS: 4.0000 mg | ORAL_TABLET | Freq: Four times a day (QID) | ORAL | Status: DC | PRN

## 2022-07-26 MED ORDER — POTASSIUM CHLORIDE CRYS ER 20 MEQ PO TBCR
40.0000 meq | EXTENDED_RELEASE_TABLET | Freq: Four times a day (QID) | ORAL | Status: DC
  Administered 2022-07-26: 40 meq via ORAL
  Filled 2022-07-26 (×2): qty 2

## 2022-07-26 MED ORDER — ONDANSETRON HCL 4 MG/2ML IJ SOLN
4.0000 mg | Freq: Four times a day (QID) | INTRAMUSCULAR | Status: DC | PRN
  Administered 2022-07-26 – 2022-07-29 (×5): 4 mg via INTRAVENOUS
  Filled 2022-07-26 (×5): qty 2

## 2022-07-26 MED ORDER — ALBUTEROL SULFATE (2.5 MG/3ML) 0.083% IN NEBU
2.5000 mg | INHALATION_SOLUTION | RESPIRATORY_TRACT | Status: DC | PRN
  Administered 2022-07-27: 2.5 mg via RESPIRATORY_TRACT
  Filled 2022-07-26: qty 3

## 2022-07-26 MED ORDER — INSULIN ASPART 100 UNIT/ML IJ SOLN
0.0000 [IU] | Freq: Every day | INTRAMUSCULAR | Status: DC
  Administered 2022-07-28: 3 [IU] via SUBCUTANEOUS
  Administered 2022-07-29: 4 [IU] via SUBCUTANEOUS

## 2022-07-26 MED ORDER — TRAZODONE HCL 50 MG PO TABS
50.0000 mg | ORAL_TABLET | Freq: Every evening | ORAL | Status: DC | PRN
  Administered 2022-07-27: 50 mg via ORAL
  Filled 2022-07-26: qty 1

## 2022-07-26 MED ORDER — HEPARIN SODIUM (PORCINE) 5000 UNIT/ML IJ SOLN
5000.0000 [IU] | Freq: Three times a day (TID) | INTRAMUSCULAR | Status: DC
  Filled 2022-07-26 (×4): qty 1

## 2022-07-26 MED ORDER — DM-GUAIFENESIN ER 30-600 MG PO TB12
1.0000 | ORAL_TABLET | Freq: Two times a day (BID) | ORAL | Status: DC
  Administered 2022-07-26 – 2022-07-30 (×6): 1 via ORAL
  Filled 2022-07-26 (×8): qty 1

## 2022-07-26 MED ORDER — POLYETHYLENE GLYCOL 3350 17 G PO PACK: 17.0000 g | PACK | Freq: Every day | ORAL | Status: DC | PRN

## 2022-07-26 MED ORDER — CLONAZEPAM 0.5 MG PO TABS
0.5000 mg | ORAL_TABLET | Freq: Every day | ORAL | Status: DC
  Administered 2022-07-26 – 2022-07-29 (×4): 0.5 mg via ORAL
  Filled 2022-07-26 (×4): qty 1

## 2022-07-26 MED ORDER — INSULIN GLARGINE-YFGN 100 UNIT/ML ~~LOC~~ SOLN
8.0000 [IU] | Freq: Every day | SUBCUTANEOUS | Status: DC
  Administered 2022-07-27 – 2022-07-29 (×3): 8 [IU] via SUBCUTANEOUS
  Filled 2022-07-26 (×4): qty 0.08

## 2022-07-26 NOTE — Telephone Encounter (Signed)
Today >  at  0800 her was Temp 101 (f ) ,chills, feels cold . Port a cath site is tender ( " I was stuck 7 times in ED April 30th")  arms itch ,she is not sure she has a rash.     0945 -Fingerstick glucose =176 and she took 14 units.  "I can't get enough water" .   Nausea relieved with zofran .  Temp now 98.9 (f ) "  Keytruda  and alimta given on Monday.  I instructed pt to go to ED. She said she will call EMS .

## 2022-07-26 NOTE — ED Notes (Signed)
Pt has ambulated to bathroom x 2 and voided but unable to get sample, first time missed cup, second time had stool in it.

## 2022-07-26 NOTE — H&P (Addendum)
[facility] H&P   Patient Demographics:    Carolyn Hooper, is a 62 y.o. female  MRN: [id number]   DOB - 1961-02-16  Admit Date - 07/26/2022  Outpatient Primary MD for the patient is [name], FNP  Referring MD/NP/PA: Dr Suezanne Jacquet  Outpatient Specialists: oncology Dr Shirline Frees    Patient coming from: home  Chief Complaint  Patient presents with   Fever   Cough      HPI:    Carolyn Hooper  is a 62 y.o. 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 -Patient reports she felt febrile this morning, so she checked her temperature was 101, she does report some cough, congestion, white phlegm, she denies dysuria, polyuria, chest pain, shortness of breath, no hemoptysis, no leg pain or worsening edema, she does report rash on itching, does report some tenderness at the Port-A-Cath site, she reports she was stuck in it 7 times in ED last week, but site looks clean on physical exam. -In ED she was afebrile, leukocytosis, chest x-ray with no acute findings, UA is pending, upon ED physician discussion with Dr. Bertis Ruddy she from oncology, they recommended observation for 24 hours, so [facility] hospitalist consulted to admit.   Review of systems:    A full 10 point Review of Systems was done, except as stated above, all other Review of Systems were negative.   With Past History of the following :    Past Medical History:  Diagnosis Date   Abscess    hydroadenitis superativa   Anxiety    DM (diabetes mellitus) (HCC)    Hyperinsulinemia     Hypertension 1983   gestational - pre-eclampsia   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[facility])    Neuroma    audiotry neuroma   Seizure (HCC)    Vertigo       Past Surgical History:  Procedure Laterality Date   ABDOMINAL HYSTERECTOMY  2006   remaining left ovary   BRONCHIAL BIOPSY  02/19/2022   Procedure: BRONCHIAL BIOPSIES;  Surgeon: Josephine Igo, DO;  Location: [facility];  Service: Pulmonary;;   BRONCHIAL BRUSHINGS  02/19/2022   Procedure: BRONCHIAL BRUSHINGS;  Surgeon: Josephine Igo, DO;  Location: [facility];  Service: Pulmonary;;   BRONCHIAL NEEDLE ASPIRATION BIOPSY  02/19/2022   Procedure: BRONCHIAL NEEDLE ASPIRATION BIOPSIES;  Surgeon: Josephine Igo, DO;  Location: [facility];  Service: Pulmonary;;   IR IMAGING GUIDED PORT INSERTION  03/29/2022   SHOULDER SURGERY Right 1960s      Social History:     Social History   Tobacco Use   Smoking status:  Former    Packs/day: .75    Types: Cigarettes    Quit date: 02/11/2022    Years since quitting: 0.4   Smokeless tobacco: Never  Substance Use Topics   Alcohol use: Yes    Comment: very rare      Family History :     Family History  Problem Relation Age of Onset   COPD Mother    Heart disease Father    Diabetes Father    Kidney disease Father    Arthritis Father        RA   Liver disease Father       Home Medications:   Prior to Admission medications   Medication Sig Start Date End Date Taking? Authorizing Provider  albuterol (VENTOLIN HFA) 108 (90 Base) MCG/ACT inhaler Inhale 2 puffs into the lungs every 6 (six) hours as needed. 01/08/21  Yes Hawks, [name] A, FNP  clonazePAM (KLONOPIN) 0.5 MG tablet Take 1/2 tablet (0.25 mg) by mouth at bedtime. For anxiety Patient taking differently: Take 0.5 mg by mouth at bedtime. 07/12/22  Yes Hawks, [name] A, FNP  fluconazole (DIFLUCAN) 150 MG tablet Take 1 tablet (150 mg) by mouth every 3 days as needed. 07/12/22  Yes Hawks, [name] A, FNP  folic acid  (FOLVITE) 1 MG tablet Take 1 tablet (1 mg total) by mouth daily. 05/26/22  Yes [name], PA-C  furosemide (LASIX) 20 MG tablet Take 1 tablet (20 mg total) by mouth daily as needed. 06/20/22  Yes Heilingoetter, [name], PA-C  dexamethasone (DECADRON) 1 MG tablet Take 3 tablets (3 mg total) by mouth daily with breakfast. Patient not taking: Reported on 07/26/2022 05/28/22   [name], MD  HYDROcodone-acetaminophen (NORCO) 5-325 MG tablet Take 1 - 2 tablets by mouth every 6 (six) hours as needed for moderate pain. 05/30/22   [name], FNP  insulin glargine (LANTUS SOLOSTAR) 100 UNIT/ML Solostar Pen Inject 8 Units into the skin daily. 07/09/22   [name], FNP  Insulin Pen Needle (PEN NEEDLES) 32G X 4 MM MISC Use to inject insulin daily as directed. 07/09/22   [name], FNP  levETIRAcetam (KEPPRA) 1000 MG tablet Take 1 tablet (1,000 mg total) by mouth 2 (two) times daily. 05/26/22   [name], PA-C  lidocaine (XYLOCAINE) 5 % ointment Apply topically as needed. 05/30/22   [name], FNP  lidocaine-prilocaine (EMLA) cream Apply to port prior to access 04/25/22   [name], MD  magic mouthwash (nystatin, diphenhydrAMINE, alum & mag hydroxide) suspension mixture Take 5 mLs by mouth 3 (three) times daily as needed for mouth pain. 07/22/22   Heilingoetter, [name], PA-C  metFORMIN (GLUCOPHAGE) 1000 MG tablet Take 1 tablet (1,000 mg total) by mouth 2 (two) times daily. 05/10/22   [name], FNP  ondansetron (ZOFRAN) 4 MG tablet Take 1 tablet (4 mg total) by mouth every 6 (six) hours as needed for nausea. 07/17/22   [name], [name], MD  pantoprazole (PROTONIX) 40 MG tablet Take 1 tablet (40 mg total) by mouth 2 (two) times daily. 07/17/22   [name], [name], MD  prochlorperazine (COMPAZINE) 10 MG tablet Take 1 tablet (10 mg total) by mouth every 6 (six) hours as needed. 05/30/22   Heilingoetter, [name], PA-C     Allergies:     Allergies   Allergen Reactions   Adhesive [Tape] Dermatitis   Statins Other (See Comments)    Myalgia, itching  Physical Exam:   Vitals  Blood pressure (!) 148/81, pulse 86, temperature 98.8 F (37.1 C), temperature source Oral, resp. rate 19, SpO2 99 %.   1. General developed female, laying in bed, lying in bed in NAD,    2. Normal affect and insight, Not Suicidal or Homicidal, Awake Alert, Oriented X 3.  3. No F.N deficits, ALL C.Nerves Intact, Strength 5/5 all 4 extremities, Sensation intact all 4 extremities, Plantars down going.  Speech with stuttering  4. Ears and Eyes appear Normal, Conjunctivae clear, PERRLA. Moist Oral Mucosa.  5. Supple Neck, No JVD, No cervical lymphadenopathy appriciated, No Carotid Bruits.  6. Symmetrical Chest wall movement, Good air movement bilaterally, CTAB.  7. RRR, No Gallops, Rubs or Murmurs, No Parasternal Heave.  Trace edema  8. Positive Bowel Sounds, Abdomen Soft, minimal right upper quadrant tenderness to deep palpation, No organomegaly appriciated,No rebound -guarding or rigidity.  9.  No Cyanosis, Normal Skin Turgor, mild erythematous rash on bilateral upper extremity, Port-A-Cath site looks clean  10. Good muscle tone,  joints appear normal , no effusions, Normal ROM.       Data Review:    CBC Recent Labs  Lab 07/22/22 1403 07/26/22 1300  WBC 14.6* 7.9  HGB 14.2 11.0*  HCT 43.3 34.4*  PLT 178 154  MCV 100.7* 99.7  MCH 33.0 31.9  MCHC 32.8 32.0  RDW 15.1 14.5  LYMPHSABS 1.9 0.8  MONOABS 1.2* 0.1  EOSABS 2.3* 1.3*  BASOSABS 0.1 0.0   ------------------------------------------------------------------------------------------------------------------  Chemistries  Recent Labs  Lab 07/22/22 1403 07/26/22 1300  NA 137 134*  K 3.8 3.3*  CL 101 98  CO2 26 24  GLUCOSE 191* 178*  BUN 12 7*  CREATININE 1.12* 0.78  CALCIUM 9.1 8.3*  AST 14* 46*  ALT 24 43  ALKPHOS 71 62  BILITOT 0.7 1.7*    ------------------------------------------------------------------------------------------------------------------ estimated creatinine clearance is 95 mL/min (by C-G formula based on SCr of 0.78 mg/dL). ------------------------------------------------------------------------------------------------------------------ No results for input(s): "TSH", "T4TOTAL", "T3FREE", "THYROIDAB" in the last 72 hours.  Invalid input(s): "FREET3"  Coagulation profile No results for input(s): "INR", "PROTIME" in the last 168 hours. ------------------------------------------------------------------------------------------------------------------- No results for input(s): "DDIMER" in the last 72 hours. -------------------------------------------------------------------------------------------------------------------  Cardiac Enzymes No results for input(s): "CKMB", "TROPONINI", "MYOGLOBIN" in the last 168 hours.  Invalid input(s): "CK" ------------------------------------------------------------------------------------------------------------------ No results found for: "BNP"   ---------------------------------------------------------------------------------------------------------------  Urinalysis    Component Value Date/Time   COLORURINE YELLOW 07/16/2022 1411   APPEARANCEUR CLEAR 07/16/2022 1411   LABSPEC 1.017 07/16/2022 1411   PHURINE 5.0 07/16/2022 1411   GLUCOSEU 150 (A) 07/16/2022 1411   HGBUR NEGATIVE 07/16/2022 1411   BILIRUBINUR NEGATIVE 07/16/2022 1411   KETONESUR 5 (A) 07/16/2022 1411   PROTEINUR NEGATIVE 07/16/2022 1411   UROBILINOGEN 0.2 04/07/2011 2356   NITRITE NEGATIVE 07/16/2022 1411   LEUKOCYTESUR SMALL (A) 07/16/2022 1411    ----------------------------------------------------------------------------------------------------------------   Imaging Results:    DG Chest Port 1 View  Result Date: 07/26/2022 CLINICAL DATA:  Cough, history non-small cell lung cancer EXAM:  PORTABLE CHEST 1 VIEW COMPARISON:  Portable exam 1315 hours compared to 07/16/2022 and correlated with CT chest of 07/15/2022 FINDINGS: RIGHT jugular Port-A-Cath with tip projecting over SVC. Upper normal heart size. Mediastinal contours and pulmonary vascularity normal. Atherosclerotic calcification aorta. Lungs clear. No pulmonary infiltrate, pleural effusion, or pneumothorax. Nodular density 11 mm diameter projecting over the lateral RIGHT upper lobe corresponds to a pulmonary nodule seen on recent CT, question metastasis. IMPRESSION: 11 mm  pulmonary nodule RIGHT upper lobe consistent with metastatic disease. Aortic Atherosclerosis (ICD10-I70.0). Electronically Signed   By: Ulyses Southward M.D.   On: 07/26/2022 13:35    EKG:  Vent. 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)   Cholelithiasis  Fever -Reports fever 101 at home, she does report some congestion and cough -5 no acute acute respiratory fever, UA is pending, but she denies any urinary symptoms, chest x-ray with no evidence of pneumonia, she does report cough and congestion, COVID is negative, but will recheck respiratory panel with the other 20 pathogens -She is with known cholelithiasis, she denies nausea, vomiting, denies abdominal pain, but she has some mild right upper quadrant tenderness to palpation, so I will check right upper quadrant ultrasound -Follow-up blood cultures -Porta catheter site looks clean -Check procalcitonin. -Will monitor her off antibiotics -While her fever could be related to chemotherapy which she received on Monday  Cholelithiasis - Will recheck right upper quadrant  ultrasound   Drug-induced diabetes mellitus with hyperglycemia -Most recent A1c is 8.2 -Continue with home insulin and will add insulin sliding scale  -Hold metformin  Stage IV lung cancer with brain mets: -He is following with Dr. Arbutus Ped, currently on chemotherapy, most recently received on Monday -Continue with Keppra with known brain mets patient reports she finished her prednisone taper  Obesity , class II DMII during admission last week is 37  Hyponatremia -Will keep on gentle hydration  Hypokalemia  - repleted, recheck in am      DVT Prophylaxis SCDs , [place] heparin  AM Labs Ordered, also please review Full Orders  Family Communication: Admission, patients condition and plan of care including tests being ordered have been discussed with the patient  who indicate understanding and agree with the plan and Code Status.  Code Status Full  Likely DC to  Home  Condition GUARDED   Consults called: none    Admission status: observation    Time spent in minutes : 70 minutes   Huey Bienenstock M.D on 07/26/2022 at 3:31 PM   [facility] - Office  [phone]

## 2022-07-26 NOTE — ED Notes (Signed)
US at bedside

## 2022-07-26 NOTE — ED Triage Notes (Signed)
Last dose of chemo 4 days ago on Monday, states rash on arms started after that. Pt redness noted to bue. Dry cough noted. Denies pain

## 2022-07-26 NOTE — Progress Notes (Signed)
Noted patient had productive, dry cough. Patient requested something for cough. MD Zierle-Ghosh made aware. New orders placed.

## 2022-07-26 NOTE — ED Triage Notes (Signed)
Pt bib [facility]. Pt a/o. Chemo pt and had fever today of 101. Took tylenol at 8am and zofran this am. C/o cough x 1 month. Lung cancer with brain mets. Cbg 197. Pt 02 sats ra 92, placed on 02 2l by ems. Pt ambulatory. Color wnl.

## 2022-07-26 NOTE — ED Notes (Signed)
Pt stated to leave all her meal trays in and do not throw them away. Pt stated she eat in small bites.

## 2022-07-26 NOTE — ED Provider Notes (Signed)
[place] EMERGENCY DEPARTMENT AT [facility] Provider Note  CSN: [id number] Arrival date & time: 07/26/22 1221  Chief Complaint(s) Fever and Cough  HPI Carolyn Hooper is a 62 y.o. female history of diabetes, non-small cell lung cancer currently undergoing chemotherapy, prior seizures presenting to the emergency department with fever.  Patient reports fever today of 101.  Took Tylenol with improvement.  Reports chronic cough which is unchanged, no productive cough.  No seizure activity.  Denies any chest or abdominal pain.  Reports rash to both arms and legs after starting chemotherapy which is itchy.  No diarrhea.  No urinary symptoms.   Past Medical History Past Medical History:  Diagnosis Date   Abscess    hydroadenitis superativa   Anxiety    DM (diabetes mellitus) (HCC)    Hyperinsulinemia    Hypertension 1983   gestational - pre-eclampsia   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[facility])    Neuroma    audiotry neuroma   Seizure (HCC)    Vertigo    Patient Active Problem List   Diagnosis Date Noted   Fever 07/26/2022   Cholelithiasis 07/16/2022   Primary insomnia 07/12/2022   Swelling 06/20/2022   Encounter for antineoplastic chemotherapy 06/05/2022   Encounter for antineoplastic immunotherapy 06/05/2022   Port-A-Cath in place 04/24/2022   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) 03/07/2022   Focal seizure (HCC) 03/07/2022   Non-small cell lung cancer (HCC) 02/25/2022   Goals of care, counseling/discussion 02/25/2022   Lung mass 02/13/2022   Subarachnoid hemorrhage (HCC) 02/12/2022   Aphasia 02/12/2022   Brain mass 02/12/2022   Leukocytosis 02/12/2022   Mass of lower lobe of left lung 02/12/2022   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   Refusal of statin medication by patient 07/10/2020   Myalgia due to statin 07/10/2020   Controlled substance agreement signed 05/28/2018   Benzodiazepine dependence (HCC) 05/28/2018   Hyperlipidemia  associated with type 2 diabetes mellitus (HCC) 04/11/2016   Morbid obesity (HCC) 04/11/2016   Former smoker 04/11/2016   Hidradenitis suppurativa 04/11/2016   GAD (generalized anxiety disorder) 08/19/2013   Uncontrolled type 2 diabetes mellitus with hyperglycemia, without long-term current use of insulin (HCC) 08/19/2013   Hyperinsulinemia 08/19/2013   Home Medication(s) Prior to Admission medications   Medication Sig Start Date End Date Taking? Authorizing Provider  albuterol (VENTOLIN HFA) 108 (90 Base) MCG/ACT inhaler Inhale 2 puffs into the lungs every 6 (six) hours as needed. 01/08/21  Yes Hawks, [name] A, FNP  clonazePAM (KLONOPIN) 0.5 MG tablet Take 1/2 tablet (0.25 mg) by mouth at bedtime. For anxiety Patient taking differently: Take 0.5 mg by mouth at bedtime. 07/12/22  Yes Hawks, [name] A, FNP  fluconazole (DIFLUCAN) 150 MG tablet Take 1 tablet (150 mg) by mouth every 3 days as needed. 07/12/22  Yes Hawks, [name] A, FNP  folic acid (FOLVITE) 1 MG tablet Take 1 tablet (1 mg total) by mouth daily. 05/26/22  Yes [name], PA-C  furosemide (LASIX) 20 MG tablet Take 1 tablet (20 mg total) by mouth daily as needed. 06/20/22  Yes Heilingoetter, [name], PA-C  HYDROcodone-acetaminophen (NORCO) 5-325 MG tablet Take 1 - 2 tablets by mouth every 6 (six) hours as needed for moderate pain. 05/30/22  Yes Hawks, [name] A, FNP  insulin glargine (LANTUS SOLOSTAR) 100 UNIT/ML Solostar Pen Inject 8 Units into the skin daily. Patient taking differently: Inject 8 Units into the skin daily. If BS over 200 increase by 2 units 07/09/22  Yes Hawks, [name] A, FNP  levETIRAcetam (KEPPRA) 1000 MG tablet Take 1 tablet (1,000 mg total) by mouth 2 (two) times daily. 05/26/22  Yes [name], PA-C  lidocaine (XYLOCAINE) 5 % ointment Apply topically as needed. 05/30/22  Yes [name], FNP  lidocaine-prilocaine (EMLA) cream Apply to port prior to access 04/25/22  Yes [name], MD  magic  mouthwash (nystatin, diphenhydrAMINE, alum & mag hydroxide) suspension mixture Take 5 mLs by mouth 3 (three) times daily as needed for mouth pain. 07/22/22  Yes Heilingoetter, [name], PA-C  metFORMIN (GLUCOPHAGE) 1000 MG tablet Take 1 tablet (1,000 mg total) by mouth 2 (two) times daily. 05/10/22  Yes Hawks, [name] A, FNP  ondansetron (ZOFRAN) 4 MG tablet Take 1 tablet (4 mg total) by mouth every 6 (six) hours as needed for nausea. Patient taking differently: Take 4-8 mg by mouth every 6 (six) hours as needed for nausea. 07/17/22  Yes [name], [name], MD  prochlorperazine (COMPAZINE) 10 MG tablet Take 1 tablet (10 mg total) by mouth every 6 (six) hours as needed. 05/30/22  Yes Heilingoetter, [name], PA-C  dexamethasone (DECADRON) 1 MG tablet Take 3 tablets (3 mg total) by mouth daily with breakfast. Patient not taking: Reported on 07/26/2022 05/28/22   [name], MD  Insulin Pen Needle (PEN NEEDLES) 32G X 4 MM MISC Use to inject insulin daily as directed. 07/09/22   [name], FNP  pantoprazole (PROTONIX) 40 MG tablet Take 1 tablet (40 mg total) by mouth 2 (two) times daily. Patient not taking: Reported on 07/26/2022 07/17/22   Alba Cory, MD                                                                                                                                    Past Surgical History Past Surgical History:  Procedure Laterality Date   ABDOMINAL HYSTERECTOMY  2006   remaining left ovary   BRONCHIAL BIOPSY  02/19/2022   Procedure: BRONCHIAL BIOPSIES;  Surgeon: Josephine Igo, DO;  Location: [facility];  Service: Pulmonary;;   BRONCHIAL BRUSHINGS  02/19/2022   Procedure: BRONCHIAL BRUSHINGS;  Surgeon: Josephine Igo, DO;  Location: [facility];  Service: Pulmonary;;   BRONCHIAL NEEDLE ASPIRATION BIOPSY  02/19/2022   Procedure: BRONCHIAL NEEDLE ASPIRATION BIOPSIES;  Surgeon: Josephine Igo, DO;  Location: [facility]OSCOPY;  Service: Pulmonary;;   IR IMAGING  GUIDED PORT INSERTION  03/29/2022   SHOULDER SURGERY Right 1960s   Family History Family History  Problem Relation Age of Onset   COPD Mother    Heart disease Father    Diabetes Father    Kidney disease Father    Arthritis Father        RA   Liver disease Father     Social History Social History   Tobacco Use   Smoking status: Former    Packs/day: .75    Types:  Cigarettes    Quit date: 02/11/2022    Years since quitting: 0.4   Smokeless tobacco: Never  Vaping Use   Vaping Use: Never used  Substance Use Topics   Alcohol use: Yes    Comment: very rare   Drug use: No   Allergies Adhesive [tape] and Statins  Review of Systems Review of Systems  All other systems reviewed and are negative.   Physical Exam Vital Signs  I have reviewed the triage vital signs BP (!) 148/81 (BP Location: Right Wrist) Comment (BP Location): per request  Pulse 86   Temp 98.8 F (37.1 C) (Oral)   Resp 19   SpO2 99%  Physical Exam Vitals and nursing note reviewed.  Constitutional:      General: She is not in acute distress.    Appearance: She is well-developed.  HENT:     Head: Normocephalic and atraumatic.     Mouth/Throat:     Mouth: Mucous membranes are moist.  Eyes:     Pupils: Pupils are equal, round, and reactive to light.  Cardiovascular:     Rate and Rhythm: Normal rate and regular rhythm.     Heart sounds: No murmur heard. Pulmonary:     Effort: Pulmonary effort is normal. No respiratory distress.     Breath sounds: Normal breath sounds.  Abdominal:     General: Abdomen is flat.     Palpations: Abdomen is soft.     Tenderness: There is no abdominal tenderness.  Musculoskeletal:        General: No tenderness.     Right lower leg: No edema.     Left lower leg: No edema.  Skin:    Comments: Erythematous rash to the bilateral forearms and legs.  Mild warmth and erythema with some excoriation of the  Neurological:     General: No focal deficit present.     Mental  Status: She is alert and oriented to person, place, and time. Mental status is at baseline.  Psychiatric:        Mood and Affect: Mood normal.        Behavior: Behavior normal.     ED Results and Treatments Labs (all labs ordered are listed, but only abnormal results are displayed) Labs Reviewed  COMPREHENSIVE METABOLIC PANEL - Abnormal; Notable for the following components:      Result Value   Sodium 134 (*)    Potassium 3.3 (*)    Glucose, Bld 178 (*)    BUN 7 (*)    Calcium 8.3 (*)    Total Protein 6.2 (*)    Albumin 2.8 (*)    AST 46 (*)    Total Bilirubin 1.7 (*)    All other components within normal limits  CBC WITH DIFFERENTIAL/PLATELET - Abnormal; Notable for the following components:   RBC 3.45 (*)    Hemoglobin 11.0 (*)    HCT 34.4 (*)    Eosinophils Absolute 1.3 (*)    All other components within normal limits  RESP PANEL BY RT-PCR (RSV, FLU A&B, COVID)  RVPGX2  CULTURE, BLOOD (ROUTINE X 2)  CULTURE, BLOOD (ROUTINE X 2)  RESPIRATORY PANEL BY PCR  LACTIC ACID, PLASMA  LACTIC ACID, PLASMA  PROCALCITONIN  URINALYSIS, ROUTINE W REFLEX MICROSCOPIC  Radiology US Abdomen Limited RUQ (LIVER/GB)  Result Date: 07/26/2022 CLINICAL DATA:  Fever EXAM: ULTRASOUND ABDOMEN LIMITED RIGHT UPPER QUADRANT COMPARISON:  Ultrasound 07/16/2022, CT 07/15/2022 FINDINGS: Gallbladder: Distended gallbladder. Large shadowing stone within the proximal lumen measure up to 4.4 cm. Normal wall thickness. Positive sonographic Murphy. Sludge filled remaining gallbladder Common bile duct: Diameter: 5.6 mm Liver: Poorly visualized. Diffuse heterogeneous echogenic parenchyma. Portal vein is patent on color Doppler imaging with normal direction of blood flow towards the liver. Other: None. IMPRESSION: 1. Distended gallbladder with large shadowing stone and sludge. Positive sonographic  Murphy's sign. Findings are suspicious for acute cholecystitis in the appropriate clinical setting. 2. Heterogeneous echogenic liver parenchyma consistent with hepatic steatosis and/or hepatocellular disease. Electronically Signed   By: Jasmine Pang M.D.   On: 07/26/2022 16:16   DG Chest Port 1 View  Result Date: 07/26/2022 CLINICAL DATA:  Cough, history non-small cell lung cancer EXAM: PORTABLE CHEST 1 VIEW COMPARISON:  Portable exam 1315 hours compared to 07/16/2022 and correlated with CT chest of 07/15/2022 FINDINGS: RIGHT jugular Port-A-Cath with tip projecting over SVC. Upper normal heart size. Mediastinal contours and pulmonary vascularity normal. Atherosclerotic calcification aorta. Lungs clear. No pulmonary infiltrate, pleural effusion, or pneumothorax. Nodular density 11 mm diameter projecting over the lateral RIGHT upper lobe corresponds to a pulmonary nodule seen on recent CT, question metastasis. IMPRESSION: 11 mm pulmonary nodule RIGHT upper lobe consistent with metastatic disease. Aortic Atherosclerosis (ICD10-I70.0). Electronically Signed   By: Ulyses Southward M.D.   On: 07/26/2022 13:35    Pertinent labs & imaging results that were available during my care of the patient were reviewed by me and considered in my medical decision making (see MDM for details).  Medications Ordered in ED Medications  potassium chloride SA (KLOR-CON M) CR tablet 40 mEq (40 mEq Oral Given 07/26/22 1601)  0.9 %  sodium chloride infusion ( Intravenous New Bag/Given 07/26/22 1603)  clonazePAM (KLONOPIN) tablet 0.5 mg (has no administration in time range)  folic acid (FOLVITE) tablet 1 mg (1 mg Oral Given 07/26/22 1601)  HYDROcodone-acetaminophen (NORCO/VICODIN) 5-325 MG per tablet 1-2 tablet (has no administration in time range)  insulin glargine-yfgn (SEMGLEE) injection 8 Units (has no administration in time range)  levETIRAcetam (KEPPRA) tablet 1,000 mg (1,000 mg Oral Given 07/26/22 1601)  heparin injection 5,000  Units (has no administration in time range)  insulin aspart (novoLOG) injection 0-15 Units (has no administration in time range)  insulin aspart (novoLOG) injection 0-5 Units (has no administration in time range)                                                                                                                                     Procedures Procedures  (including critical care time)  Medical Decision Making / ED Course   MDM:  62 year old female presenting to the emergency department with fever.  Patient well-appearing, physical exam with rash, otherwise reassuring.  No abdominal pain or tenderness.  Unclear cause of fever, could be drug effect, occult infectious process.  She does have erythema to the arms of the legs, took picture for chart.  Seems less consistent with cellulitis given bilateral nature and bilateral lower extremities, as well as itching.  Chest x-ray clear.  Still pending urinalysis.  No abdominal pain.  Does have history of gallstones, denies any specific right upper quadrant pain recently, just ate soup and denies any worsening of her pain.  Discussed with oncologist Dr. Lyna Poser who recommends admission for observation.  She is afebrile in the emergency department.  Discussed with hospitalist Dr. Randol Kern who will admit.      Additional history obtained: -Additional history obtained from ems -External records from outside source obtained and reviewed including: Chart review including previous notes, labs, imaging, consultation notes including prior oncology notes   Lab Tests: -I ordered, reviewed, and interpreted labs.   The pertinent results include:   Labs Reviewed  COMPREHENSIVE METABOLIC PANEL - Abnormal; Notable for the following components:      Result Value   Sodium 134 (*)    Potassium 3.3 (*)    Glucose, Bld 178 (*)    BUN 7 (*)    Calcium 8.3 (*)    Total Protein 6.2 (*)    Albumin 2.8 (*)    AST 46 (*)    Total Bilirubin 1.7 (*)     All other components within normal limits  CBC WITH DIFFERENTIAL/PLATELET - Abnormal; Notable for the following components:   RBC 3.45 (*)    Hemoglobin 11.0 (*)    HCT 34.4 (*)    Eosinophils Absolute 1.3 (*)    All other components within normal limits  RESP PANEL BY RT-PCR (RSV, FLU A&B, COVID)  RVPGX2  CULTURE, BLOOD (ROUTINE X 2)  CULTURE, BLOOD (ROUTINE X 2)  RESPIRATORY PANEL BY PCR  LACTIC ACID, PLASMA  LACTIC ACID, PLASMA  PROCALCITONIN  URINALYSIS, ROUTINE W REFLEX MICROSCOPIC    Notable for no leukocytosis or neutropenia  EKG   EKG Interpretation  Date/Time:    Ventricular Rate:    PR Interval:    QRS Duration:   QT Interval:    QTC Calculation:   R Axis:     Text Interpretation:           Imaging Studies ordered: I ordered imaging studies including CXR On my interpretation imaging demonstrates no acute process I independently visualized and interpreted imaging. I agree with the radiologist interpretation   Medicines ordered and prescription drug management: Meds ordered this encounter  Medications   potassium chloride SA (KLOR-CON M) CR tablet 40 mEq   0.9 %  sodium chloride infusion   clonazePAM (KLONOPIN) tablet 0.5 mg   folic acid (FOLVITE) tablet 1 mg   HYDROcodone-acetaminophen (NORCO/VICODIN) 5-325 MG per tablet 1-2 tablet   insulin glargine-yfgn (SEMGLEE) injection 8 Units   levETIRAcetam (KEPPRA) tablet 1,000 mg   heparin injection 5,000 Units   insulin aspart (novoLOG) injection 0-15 Units    Order Specific Question:   Correction coverage:    Answer:   Moderate (average weight, post-op)    Order Specific Question:   CBG < 70:    Answer:   Implement Hypoglycemia Standing Orders and refer to Hypoglycemia Standing Orders sidebar report    Order Specific Question:   CBG 70 - 120:    Answer:   0 units    Order Specific Question:   CBG 121 - 150:  Answer:   2 units    Order Specific Question:   CBG 151 - 200:    Answer:   3 units     Order Specific Question:   CBG 201 - 250:    Answer:   5 units    Order Specific Question:   CBG 251 - 300:    Answer:   8 units    Order Specific Question:   CBG 301 - 350:    Answer:   11 units    Order Specific Question:   CBG 351 - 400:    Answer:   15 units    Order Specific Question:   CBG > 400    Answer:   call MD and obtain STAT lab verification   insulin aspart (novoLOG) injection 0-5 Units    Order Specific Question:   Correction coverage:    Answer:   HS scale    Order Specific Question:   CBG < 70:    Answer:   Implement Hypoglycemia Standing Orders and refer to Hypoglycemia Standing Orders sidebar report    Order Specific Question:   CBG 70 - 120:    Answer:   0 units    Order Specific Question:   CBG 121 - 150:    Answer:   0 units    Order Specific Question:   CBG 151 - 200:    Answer:   0 units    Order Specific Question:   CBG 201 - 250:    Answer:   2 units    Order Specific Question:   CBG 251 - 300:    Answer:   3 units    Order Specific Question:   CBG 301 - 350:    Answer:   4 units    Order Specific Question:   CBG 351 - 400:    Answer:   5 units    Order Specific Question:   CBG > 400    Answer:   call MD and obtain STAT lab verification    -I have reviewed the patients home medicines and have made adjustments as needed   Consultations Obtained: I requested consultation with the oncologist,  and discussed lab and imaging findings as well as pertinent plan - they recommend: admit for obs   Cardiac Monitoring: The patient was maintained on a cardiac monitor.  I personally viewed and interpreted the cardiac monitored which showed an underlying rhythm of: NSR  Social Determinants of Health:  Diagnosis or treatment significantly limited by social determinants of health: obesity   Reevaluation: After the interventions noted above, I reevaluated the patient and found that their symptoms have improved  Co morbidities that complicate the patient  evaluation  Past Medical History:  Diagnosis Date   Abscess    hydroadenitis superativa   Anxiety    DM (diabetes mellitus) (HCC)    Hyperinsulinemia    Hypertension 1983   gestational - pre-eclampsia   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[facility])    Neuroma    audiotry neuroma   Seizure (HCC)    Vertigo       Dispostion: Disposition decision including need for hospitalization was considered, and patient admitted to the hospital.    Final Clinical Impression(s) / ED Diagnoses Final diagnoses:  Fever, unspecified fever cause     This chart was dictated using voice recognition software.  Despite best efforts to proofread,  errors can occur which can change the documentation meaning.    Alvino Blood  L, MD 07/26/22 1637

## 2022-07-26 NOTE — ED Notes (Signed)
Water given. Dietary called for soup per pt request. Pt nad.

## 2022-07-27 ENCOUNTER — Inpatient Hospital Stay (HOSPITAL_COMMUNITY): Payer: BC Managed Care – PPO

## 2022-07-27 DIAGNOSIS — C3490 Malignant neoplasm of unspecified part of unspecified bronchus or lung: Secondary | ICD-10-CM | POA: Diagnosis present

## 2022-07-27 DIAGNOSIS — R5081 Fever presenting with conditions classified elsewhere: Secondary | ICD-10-CM

## 2022-07-27 DIAGNOSIS — R509 Fever, unspecified: Secondary | ICD-10-CM | POA: Diagnosis present

## 2022-07-27 DIAGNOSIS — R21 Rash and other nonspecific skin eruption: Secondary | ICD-10-CM | POA: Diagnosis present

## 2022-07-27 DIAGNOSIS — K8020 Calculus of gallbladder without cholecystitis without obstruction: Secondary | ICD-10-CM | POA: Diagnosis not present

## 2022-07-27 DIAGNOSIS — R197 Diarrhea, unspecified: Secondary | ICD-10-CM

## 2022-07-27 DIAGNOSIS — K8000 Calculus of gallbladder with acute cholecystitis without obstruction: Secondary | ICD-10-CM | POA: Diagnosis present

## 2022-07-27 DIAGNOSIS — R1030 Lower abdominal pain, unspecified: Secondary | ICD-10-CM

## 2022-07-27 DIAGNOSIS — E1169 Type 2 diabetes mellitus with other specified complication: Secondary | ICD-10-CM

## 2022-07-27 DIAGNOSIS — E876 Hypokalemia: Secondary | ICD-10-CM | POA: Diagnosis present

## 2022-07-27 DIAGNOSIS — Z794 Long term (current) use of insulin: Secondary | ICD-10-CM | POA: Diagnosis not present

## 2022-07-27 DIAGNOSIS — C7931 Secondary malignant neoplasm of brain: Secondary | ICD-10-CM | POA: Diagnosis present

## 2022-07-27 DIAGNOSIS — Z1152 Encounter for screening for COVID-19: Secondary | ICD-10-CM | POA: Diagnosis not present

## 2022-07-27 DIAGNOSIS — E1165 Type 2 diabetes mellitus with hyperglycemia: Secondary | ICD-10-CM | POA: Diagnosis present

## 2022-07-27 DIAGNOSIS — Z95828 Presence of other vascular implants and grafts: Secondary | ICD-10-CM | POA: Diagnosis not present

## 2022-07-27 DIAGNOSIS — I10 Essential (primary) hypertension: Secondary | ICD-10-CM | POA: Diagnosis present

## 2022-07-27 DIAGNOSIS — E785 Hyperlipidemia, unspecified: Secondary | ICD-10-CM | POA: Diagnosis present

## 2022-07-27 DIAGNOSIS — E871 Hypo-osmolality and hyponatremia: Secondary | ICD-10-CM | POA: Diagnosis present

## 2022-07-27 DIAGNOSIS — Z87891 Personal history of nicotine dependence: Secondary | ICD-10-CM | POA: Diagnosis not present

## 2022-07-27 DIAGNOSIS — T380X5A Adverse effect of glucocorticoids and synthetic analogues, initial encounter: Secondary | ICD-10-CM | POA: Diagnosis present

## 2022-07-27 DIAGNOSIS — Z8249 Family history of ischemic heart disease and other diseases of the circulatory system: Secondary | ICD-10-CM | POA: Diagnosis not present

## 2022-07-27 DIAGNOSIS — D630 Anemia in neoplastic disease: Secondary | ICD-10-CM | POA: Diagnosis present

## 2022-07-27 DIAGNOSIS — D849 Immunodeficiency, unspecified: Secondary | ICD-10-CM | POA: Diagnosis present

## 2022-07-27 DIAGNOSIS — E860 Dehydration: Secondary | ICD-10-CM | POA: Diagnosis present

## 2022-07-27 DIAGNOSIS — K810 Acute cholecystitis: Secondary | ICD-10-CM | POA: Diagnosis present

## 2022-07-27 DIAGNOSIS — D696 Thrombocytopenia, unspecified: Secondary | ICD-10-CM | POA: Diagnosis present

## 2022-07-27 LAB — CBC
HCT: 31.5 % — ABNORMAL LOW (ref 36.0–46.0)
Hemoglobin: 10.1 g/dL — ABNORMAL LOW (ref 12.0–15.0)
MCH: 32.3 pg (ref 26.0–34.0)
MCHC: 32.1 g/dL (ref 30.0–36.0)
MCV: 100.6 fL — ABNORMAL HIGH (ref 80.0–100.0)
Platelets: 138 10*3/uL — ABNORMAL LOW (ref 150–400)
RBC: 3.13 MIL/uL — ABNORMAL LOW (ref 3.87–5.11)
RDW: 14.6 % (ref 11.5–15.5)
WBC: 6.9 10*3/uL (ref 4.0–10.5)
nRBC: 0 % (ref 0.0–0.2)

## 2022-07-27 LAB — GLUCOSE, CAPILLARY
Glucose-Capillary: 163 mg/dL — ABNORMAL HIGH (ref 70–99)
Glucose-Capillary: 157 mg/dL — ABNORMAL HIGH (ref 70–99)
Glucose-Capillary: 154 mg/dL — ABNORMAL HIGH (ref 70–99)
Glucose-Capillary: 140 mg/dL — ABNORMAL HIGH (ref 70–99)

## 2022-07-27 LAB — COMPREHENSIVE METABOLIC PANEL
ALT: 39 U/L (ref 0–44)
AST: 39 U/L (ref 15–41)
Albumin: 2.5 g/dL — ABNORMAL LOW (ref 3.5–5.0)
Alkaline Phosphatase: 51 U/L (ref 38–126)
Anion gap: 8 (ref 5–15)
BUN: 8 mg/dL (ref 8–23)
CO2: 26 mmol/L (ref 22–32)
Calcium: 8.1 mg/dL — ABNORMAL LOW (ref 8.9–10.3)
Chloride: 104 mmol/L (ref 98–111)
Creatinine, Ser: 0.69 mg/dL (ref 0.44–1.00)
GFR, Estimated: >60 mL/min (ref >60)
Glucose, Bld: 139 mg/dL — ABNORMAL HIGH (ref 70–99)
Potassium: 3.8 mmol/L (ref 3.5–5.1)
Sodium: 138 mmol/L (ref 135–145)
Total Bilirubin: 1.3 mg/dL — ABNORMAL HIGH (ref 0.3–1.2)
Total Protein: 5.7 g/dL — ABNORMAL LOW (ref 6.5–8.1)

## 2022-07-27 LAB — IRON AND TIBC
Iron: 114 ug/dL (ref 28–170)
Saturation Ratios: 60 % — ABNORMAL HIGH (ref 10.4–31.8)
TIBC: 190 ug/dL — ABNORMAL LOW (ref 250–450)
UIBC: 76 ug/dL

## 2022-07-27 LAB — VITAMIN B12: Vitamin B-12: 1112 pg/mL — ABNORMAL HIGH (ref 180–914)

## 2022-07-27 LAB — FOLATE: Folate: 23.8 ng/mL (ref >5.9)

## 2022-07-27 LAB — RETICULOCYTES
Immature Retic Fract: 4.9 % (ref 2.3–15.9)
RBC.: 3.17 MIL/uL — ABNORMAL LOW (ref 3.87–5.11)
Retic Count, Absolute: 18.4 10*3/uL — ABNORMAL LOW (ref 19.0–186.0)
Retic Ct Pct: 0.6 % (ref 0.4–3.1)

## 2022-07-27 LAB — FERRITIN: Ferritin: 1442 ng/mL — ABNORMAL HIGH (ref 11–307)

## 2022-07-27 MED ORDER — PIPERACILLIN-TAZOBACTAM 3.375 G IVPB
3.3750 g | Freq: Three times a day (TID) | INTRAVENOUS | Status: DC
  Administered 2022-07-27 – 2022-07-30 (×9): 3.375 g via INTRAVENOUS
  Filled 2022-07-27 (×9): qty 50

## 2022-07-27 MED ORDER — CHLORHEXIDINE GLUCONATE CLOTH 2 % EX PADS
6.0000 | MEDICATED_PAD | Freq: Every day | CUTANEOUS | Status: DC
  Administered 2022-07-28 – 2022-07-30 (×3): 6 via TOPICAL

## 2022-07-27 MED ORDER — IOHEXOL 9 MG/ML PO SOLN
500.0000 mL | ORAL | Status: AC
  Administered 2022-07-27 (×2): 500 mL via ORAL

## 2022-07-27 MED ORDER — GUAIFENESIN-DM 100-10 MG/5ML PO SYRP
5.0000 mL | ORAL_SOLUTION | ORAL | Status: DC | PRN
  Administered 2022-07-27 – 2022-07-28 (×4): 5 mL via ORAL
  Filled 2022-07-27 (×4): qty 5

## 2022-07-27 MED ORDER — SODIUM CHLORIDE 0.9 % IV SOLN: INTRAVENOUS | Status: DC

## 2022-07-27 MED ORDER — IOHEXOL 300 MG/ML  SOLN
100.0000 mL | Freq: Once | INTRAMUSCULAR | Status: AC | PRN
  Administered 2022-07-27: 100 mL via INTRAVENOUS

## 2022-07-27 MED ORDER — LOPERAMIDE HCL 2 MG PO CAPS
2.0000 mg | ORAL_CAPSULE | Freq: Once | ORAL | Status: AC
  Administered 2022-07-27: 2 mg via ORAL
  Filled 2022-07-27: qty 1

## 2022-07-27 NOTE — Progress Notes (Signed)
Patient had home medication at bedside. Medication placed in secure bag and sent to pharmacy by [facility].

## 2022-07-27 NOTE — Consult Note (Signed)
[facility] Consult  Reason for Consult: Fever, gallstone  Referring Physician: Dr. Laural Benes  Chief Complaint   Fever; Cough     HPI: Carolyn Hooper is a 62 y.o. 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 She has no nausea or vomiting. She has had a similar episode a few weeks ago. She was seen and determined to have no biliary colic and was seen by [facility] and was determined to be a poor surgical candidate due to her history of metastatic cancer.    The patient says she has also been having a lot of diarrhea for several days.. She reports that she has had some cellulitis in the past and is worried she has some on her legs now. She is eating and having no pain with meals. She has been having fevers. She is not neutropenic. She says she did not have any pain with the [place] despite the report of Murphy's sign.   Past Medical History:  Diagnosis Date   Abscess    hydroadenitis superativa   Anxiety    DM (diabetes mellitus) (HCC)    Hyperinsulinemia    Hypertension 1983   gestational - pre-eclampsia   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[facility])    Neuroma    audiotry neuroma   Seizure (HCC)    Vertigo     Past Surgical History:  Procedure Laterality Date   ABDOMINAL HYSTERECTOMY  2006   remaining left ovary   BRONCHIAL BIOPSY  02/19/2022   Procedure: BRONCHIAL BIOPSIES;  Surgeon: Josephine Igo, DO;  Location: [facility];  Service: Pulmonary;;   BRONCHIAL BRUSHINGS  02/19/2022   Procedure: BRONCHIAL BRUSHINGS;  Surgeon: Josephine Igo, DO;  Location: [facility];  Service: Pulmonary;;   BRONCHIAL NEEDLE ASPIRATION BIOPSY  02/19/2022   Procedure: BRONCHIAL NEEDLE ASPIRATION BIOPSIES;  Surgeon: Josephine Igo, DO;  Location: [facility];  Service: Pulmonary;;   IR IMAGING GUIDED PORT INSERTION  03/29/2022   SHOULDER SURGERY Right 1960s    Family History   Problem Relation Age of Onset   COPD Mother    Heart disease Father    Diabetes Father    Kidney disease Father    Arthritis Father        RA   Liver disease Father     Social History   Tobacco Use   Smoking status: Former    Packs/day: .75    Types: Cigarettes    Quit date: 02/11/2022    Years since quitting: 0.4   Smokeless tobacco: Never  Vaping Use   Vaping Use: Never used  Substance Use Topics   Alcohol use: Yes    Comment: very rare   Drug use: No    Medications: I have reviewed the patient's current medications. Prior to Admission:  Medications Prior to Admission  Medication Sig Dispense Refill Last Dose   albuterol (VENTOLIN HFA) 108 (90 Base) MCG/ACT inhaler Inhale 2 puffs into the lungs every 6 (six) hours as needed. 18 g 2 unknown   clonazePAM (KLONOPIN) 0.5 MG tablet Take 1/2 tablet (0.25 mg) by mouth at bedtime. For anxiety (Patient taking differently: Take 0.5 mg by mouth at bedtime.) 30 tablet 2 07/25/2022   fluconazole (DIFLUCAN) 150 MG tablet Take 1 tablet (150 mg) by mouth every 3 days as needed. 3 tablet 0 unknown   folic acid (FOLVITE) 1 MG tablet Take 1 tablet (1  mg total) by mouth daily. 30 tablet 1 07/25/2022   furosemide (LASIX) 20 MG tablet Take 1 tablet (20 mg total) by mouth daily as needed. 10 tablet 0 Past Week   HYDROcodone-acetaminophen (NORCO) 5-325 MG tablet Take 1 - 2 tablets by mouth every 6 (six) hours as needed for moderate pain. 30 tablet 0 unknown   insulin glargine (LANTUS SOLOSTAR) 100 UNIT/ML Solostar Pen Inject 8 Units into the skin daily. (Patient taking differently: Inject 8 Units into the skin daily. If BS over 200 increase by 2 units) 15 mL PRN 07/26/2022   levETIRAcetam (KEPPRA) 1000 MG tablet Take 1 tablet (1,000 mg total) by mouth 2 (two) times daily. 60 tablet 1 07/25/2022   lidocaine (XYLOCAINE) 5 % ointment Apply topically as needed. 60 g 1 unknown   lidocaine-prilocaine (EMLA) cream Apply to port prior to access 30 g 0 unknown    magic mouthwash (nystatin, diphenhydrAMINE, alum & mag hydroxide) suspension mixture Take 5 mLs by mouth 3 (three) times daily as needed for mouth pain. 240 mL 1 unknown   metFORMIN (GLUCOPHAGE) 1000 MG tablet Take 1 tablet (1,000 mg total) by mouth 2 (two) times daily. 180 tablet 1 07/26/2022   ondansetron (ZOFRAN) 4 MG tablet Take 1 tablet (4 mg total) by mouth every 6 (six) hours as needed for nausea. (Patient taking differently: Take 4-8 mg by mouth every 6 (six) hours as needed for nausea.) 30 tablet 2 07/26/2022   prochlorperazine (COMPAZINE) 10 MG tablet Take 1 tablet (10 mg total) by mouth every 6 (six) hours as needed. 30 tablet 2 unknown   dexamethasone (DECADRON) 1 MG tablet Take 3 tablets (3 mg total) by mouth daily with breakfast. (Patient not taking: Reported on 07/26/2022) 90 tablet 1 Completed Course   Insulin Pen Needle (PEN NEEDLES) 32G X 4 MM MISC Use to inject insulin daily as directed. 100 each 11    pantoprazole (PROTONIX) 40 MG tablet Take 1 tablet (40 mg total) by mouth 2 (two) times daily. (Patient not taking: Reported on 07/26/2022) 60 tablet 0 Not Taking   Scheduled:  clonazePAM  0.5 mg Oral QHS   dextromethorphan-guaiFENesin  1 tablet Oral BID   folic acid  1 mg Oral Daily   heparin injection (subcutaneous)  5,000 Units Subcutaneous Q8H   insulin aspart  0-15 Units Subcutaneous TID WC   insulin aspart  0-5 Units Subcutaneous QHS   insulin glargine-yfgn  8 Units Subcutaneous Daily   iohexol  500 mL Oral Q1H   levETIRAcetam  1,000 mg Oral BID   Continuous:  piperacillin-tazobactam (ZOSYN)  IV 3.375 g (07/27/22 1205)   BJY:NWGNFAOZHYQMV **OR** acetaminophen, albuterol, guaiFENesin-dextromethorphan, hydrALAZINE, HYDROcodone-acetaminophen, hydrocortisone cream, ondansetron **OR** ondansetron (ZOFRAN) IV, oxyCODONE, polyethylene glycol, traZODone  Allergies  Allergen Reactions   Adhesive [Tape] Dermatitis   Statins Other (See Comments)    Myalgia, itching     ROS:   A comprehensive review of systems was negative except for: Gastrointestinal: positive for vague lower abdominal pain  Hematologic/lymphatic: positive for metastatic lung cancer, chemotherapy  Blood pressure (!) 119/49, pulse 77, temperature 98.4 F (36.9 C), temperature source Oral, resp. rate 18, SpO2 98 %. Physical Exam Vitals reviewed.  HENT:     Head: Normocephalic.     Nose: Nose normal.  Eyes:     Extraocular Movements: Extraocular movements intact.  Cardiovascular:     Rate and Rhythm: Normal rate.  Pulmonary:     Effort: Pulmonary effort is normal.  Abdominal:     General:  There is no distension.     Palpations: Abdomen is soft.     Tenderness: There is abdominal tenderness.     Comments: Mild  tenderness in the lower abdomen, minimal to no tenderness in the upper abdomen   Musculoskeletal:        General: Swelling present.     Comments: Some minor redness on the right anterior shin   Skin:    General: Skin is warm.  Neurological:     General: No focal deficit present.     Mental Status: She is alert and oriented to person, place, and time.  Psychiatric:        Mood and Affect: Mood normal.        Behavior: Behavior normal.     Results: Results for orders placed or performed during the hospital encounter of 07/26/22 (from the past 48 hour(s))  Lactic acid, plasma     Status: None   Collection Time: 07/26/22  1:00 PM  Result Value Ref Range   Lactic Acid, Venous 1.3 0.5 - 1.9 mmol/L    Comment: Performed at [facility], [address] [place], [place] [address]  Comprehensive metabolic panel     Status: Abnormal   Collection Time: 07/26/22  1:00 PM  Result Value Ref Range   Sodium 134 (L) 135 - 145 mmol/L   Potassium 3.3 (L) 3.5 - 5.1 mmol/L   Chloride 98 98 - 111 mmol/L   CO2 24 22 - 32 mmol/L   Glucose, Bld 178 (H) 70 - 99 mg/dL    Comment: Glucose reference range applies only to samples taken after fasting for at least 8 hours.   BUN 7 (L) 8 - 23 mg/dL    Creatinine, Ser 1.91 0.44 - 1.00 mg/dL   Calcium 8.3 (L) 8.9 - 10.3 mg/dL   Total Protein 6.2 (L) 6.5 - 8.1 g/dL   Albumin 2.8 (L) 3.5 - 5.0 g/dL   AST 46 (H) 15 - 41 U/L   ALT 43 0 - 44 U/L   Alkaline Phosphatase 62 38 - 126 U/L   Total Bilirubin 1.7 (H) 0.3 - 1.2 mg/dL   GFR, Estimated >47 >82 mL/min    Comment: (NOTE) Calculated using the CKD-EPI Creatinine Equation (2021)    Anion gap 12 5 - 15    Comment: Performed at [facility], [address] [place], [place] [address]  CBC with Differential     Status: Abnormal   Collection Time: 07/26/22  1:00 PM  Result Value Ref Range   WBC 7.9 4.0 - 10.5 K/uL   RBC 3.45 (L) 3.87 - 5.11 MIL/uL   Hemoglobin 11.0 (L) 12.0 - 15.0 g/dL   HCT 30.8 (L) 65.7 - 84.6 %   MCV 99.7 80.0 - 100.0 fL   MCH 31.9 26.0 - 34.0 pg   MCHC 32.0 30.0 - 36.0 g/dL   RDW 96.2 95.2 - 84.1 %   Platelets 154 150 - 400 K/uL   nRBC 0.0 0.0 - 0.2 %   Neutrophils Relative % 72 %   Neutro Abs 5.6 1.7 - 7.7 K/uL   Lymphocytes Relative 11 %   Lymphs Abs 0.8 0.7 - 4.0 K/uL   Monocytes Relative 1 %   Monocytes Absolute 0.1 0.1 - 1.0 K/uL   Eosinophils Relative 16 %   Eosinophils Absolute 1.3 (H) 0.0 - 0.5 K/uL   Basophils Relative 0 %   Basophils Absolute 0.0 0.0 - 0.1 K/uL   Immature Granulocytes 0 %  Abs Immature Granulocytes 0.03 0.00 - 0.07 K/uL    Comment: Performed at [facility], [address], [place], [place] [address]  Resp panel by RT-PCR (RSV, Flu A&B, Covid) Anterior Nasal Swab     Status: None   Collection Time: 07/26/22  2:19 PM   Specimen: Anterior Nasal Swab  Result Value Ref Range   SARS Coronavirus 2 by RT PCR NEGATIVE NEGATIVE    Comment: (NOTE) SARS-CoV-2 target nucleic acids are NOT DETECTED.  The SARS-CoV-2 RNA is generally detectable in upper respiratory specimens during the acute phase of infection. The lowest concentration of SARS-CoV-2 viral copies this assay can detect is 138 copies/mL. A negative result does not preclude  SARS-Cov-2 infection and should not be used as the sole basis for treatment or other patient management decisions. A negative result may occur with  improper specimen collection/handling, submission of specimen other than nasopharyngeal swab, presence of viral mutation(s) within the areas targeted by this assay, and inadequate number of viral copies(<138 copies/mL). A negative result must be combined with clinical observations, patient history, and epidemiological information. The expected result is Negative.  Fact Sheet for Patients:  BloggerCourse.com  Fact Sheet for Healthcare Providers:  SeriousBroker.it  This test is no t yet approved or cleared by the [place] FDA and  has been authorized for detection and/or diagnosis of SARS-CoV-2 by FDA under an Emergency Use Authorization (EUA). This EUA will remain  in effect (meaning this test can be used) for the duration of the COVID-19 declaration under Section 564(b)(1) of the Act, 21 U.S.C.section 360bbb-3(b)(1), unless the authorization is terminated  or revoked sooner.       Influenza A by PCR NEGATIVE NEGATIVE   Influenza B by PCR NEGATIVE NEGATIVE    Comment: (NOTE) The Xpert Xpress SARS-CoV-2/FLU/RSV plus assay is intended as an aid in the diagnosis of influenza from Nasopharyngeal swab specimens and should not be used as a sole basis for treatment. Nasal washings and aspirates are unacceptable for Xpert Xpress SARS-CoV-2/FLU/RSV testing.  Fact Sheet for Patients: BloggerCourse.com  Fact Sheet for Healthcare Providers: SeriousBroker.it  This test is not yet approved or cleared by the [place] FDA and has been authorized for detection and/or diagnosis of SARS-CoV-2 by FDA under an Emergency Use Authorization (EUA). This EUA will remain in effect (meaning this test can be used) for the duration of the COVID-19  declaration under Section 564(b)(1) of the Act, 21 U.S.C. section 360bbb-3(b)(1), unless the authorization is terminated or revoked.     Resp Syncytial Virus by PCR NEGATIVE NEGATIVE    Comment: (NOTE) Fact Sheet for Patients: BloggerCourse.com  Fact Sheet for Healthcare Providers: SeriousBroker.it  This test is not yet approved or cleared by the [place] FDA and has been authorized for detection and/or diagnosis of SARS-CoV-2 by FDA under an Emergency Use Authorization (EUA). This EUA will remain in effect (meaning this test can be used) for the duration of the COVID-19 declaration under Section 564(b)(1) of the Act, 21 U.S.C. section 360bbb-3(b)(1), unless the authorization is terminated or revoked.  Performed at [facility], [address], [place], [place] [address]   Respiratory (~20 pathogens) panel by PCR     Status: None   Collection Time: 07/26/22  3:25 PM   Specimen: Nasopharyngeal Swab; Respiratory  Result Value Ref Range   Adenovirus NOT DETECTED NOT DETECTED   Coronavirus 229E NOT DETECTED NOT DETECTED    Comment: (NOTE) The Coronavirus on the Respiratory Panel, DOES NOT test for the  novel  Coronavirus (2019 nCoV)    Coronavirus HKU1 NOT DETECTED NOT DETECTED   Coronavirus NL63 NOT DETECTED NOT DETECTED   Coronavirus OC43 NOT DETECTED NOT DETECTED   Metapneumovirus NOT DETECTED NOT DETECTED   Rhinovirus / Enterovirus NOT DETECTED NOT DETECTED   Influenza A NOT DETECTED NOT DETECTED   Influenza B NOT DETECTED NOT DETECTED   Parainfluenza Virus 1 NOT DETECTED NOT DETECTED   Parainfluenza Virus 2 NOT DETECTED NOT DETECTED   Parainfluenza Virus 3 NOT DETECTED NOT DETECTED   Parainfluenza Virus 4 NOT DETECTED NOT DETECTED   Respiratory Syncytial Virus NOT DETECTED NOT DETECTED   Bordetella pertussis NOT DETECTED NOT DETECTED   Bordetella Parapertussis NOT DETECTED NOT DETECTED   Chlamydophila pneumoniae NOT  DETECTED NOT DETECTED   Mycoplasma pneumoniae NOT DETECTED NOT DETECTED    Comment: Performed at [facility], [address], [place], [place] [address]  Lactic acid, plasma     Status: None   Collection Time: 07/26/22  3:30 PM  Result Value Ref Range   Lactic Acid, Venous 1.8 0.5 - 1.9 mmol/L    Comment: Performed at [facility], [address], [place], [place] [address]  Culture, blood (routine x 2)     Status: None (Preliminary result)   Collection Time: 07/26/22  3:30 PM   Specimen: BLOOD  Result Value Ref Range   Specimen Description BLOOD BLOOD LEFT HAND    Special Requests      BOTTLES DRAWN AEROBIC AND ANAEROBIC Blood Culture adequate volume Performed at [facility], [address], [place], [place] [address]    Culture PENDING    Report Status PENDING   Culture, blood (routine x 2)     Status: None (Preliminary result)   Collection Time: 07/26/22  3:31 PM   Specimen: BLOOD  Result Value Ref Range   Specimen Description BLOOD BLOOD LEFT ARM    Special Requests      BOTTLES DRAWN AEROBIC AND ANAEROBIC Blood Culture adequate volume Performed at [facility], [address], [place], [place] [address]    Culture PENDING    Report Status PENDING   Procalcitonin     Status: None   Collection Time: 07/26/22  3:31 PM  Result Value Ref Range   Procalcitonin 0.32 ng/mL    Comment:        Interpretation: PCT (Procalcitonin) <= 0.5 ng/mL: Systemic infection (sepsis) is not likely. Local bacterial infection is possible. (NOTE)       Sepsis PCT Algorithm           Lower Respiratory Tract                                      Infection PCT Algorithm    ----------------------------     ----------------------------         PCT < 0.25 ng/mL                PCT < 0.10 ng/mL          Strongly encourage             Strongly discourage   discontinuation of antibiotics    initiation of antibiotics    ----------------------------     -----------------------------       PCT 0.25  - 0.50 ng/mL            PCT 0.10 - 0.25 ng/mL  OR       >80% decrease in PCT            Discourage initiation of                                            antibiotics      Encourage discontinuation           of antibiotics    ----------------------------     -----------------------------         PCT >= 0.50 ng/mL              PCT 0.26 - 0.50 ng/mL               AND        <80% decrease in PCT             Encourage initiation of                                             antibiotics       Encourage continuation           of antibiotics    ----------------------------     -----------------------------        PCT >= 0.50 ng/mL                  PCT > 0.50 ng/mL               AND         increase in PCT                  Strongly encourage                                      initiation of antibiotics    Strongly encourage escalation           of antibiotics                                     -----------------------------                                           PCT <= 0.25 ng/mL                                                 OR                                        > 80% decrease in PCT                                      Discontinue / Do not initiate  antibiotics  Performed at [facility], [address] [place], [place] [address]   CBG monitoring, ED     Status: Abnormal   Collection Time: 07/26/22  5:08 PM  Result Value Ref Range   Glucose-Capillary 157 (H) 70 - 99 mg/dL    Comment: Glucose reference range applies only to samples taken after fasting for at least 8 hours.  Glucose, capillary     Status: Abnormal   Collection Time: 07/26/22  8:37 PM  Result Value Ref Range   Glucose-Capillary 146 (H) 70 - 99 mg/dL    Comment: Glucose reference range applies only to samples taken after fasting for at least 8 hours.   Comment 1 Notify RN    Comment 2 Document in Chart   Urinalysis, Routine w reflex microscopic -Urine,  Clean Catch     Status: Abnormal   Collection Time: 07/26/22  8:45 PM  Result Value Ref Range   Color, Urine YELLOW YELLOW   APPearance CLEAR CLEAR   Specific Gravity, Urine 1.005 1.005 - 1.030   pH 6.0 5.0 - 8.0   Glucose, UA NEGATIVE NEGATIVE mg/dL   Hgb urine dipstick NEGATIVE NEGATIVE   Bilirubin Urine NEGATIVE NEGATIVE   Ketones, ur 5 (A) NEGATIVE mg/dL   Protein, ur NEGATIVE NEGATIVE mg/dL   Nitrite NEGATIVE NEGATIVE   Leukocytes,Ua SMALL (A) NEGATIVE   RBC / HPF 0-5 0 - 5 RBC/hpf   WBC, UA 11-20 0 - 5 WBC/hpf   Bacteria, UA RARE (A) NONE SEEN   Squamous Epithelial / HPF 0-5 0 - 5 /HPF   Trans Epithel, UA <1     Comment: Performed at [facility], [address] [place], [place] [address]  Comprehensive metabolic panel     Status: Abnormal   Collection Time: 07/27/22  5:46 AM  Result Value Ref Range   Sodium 138 135 - 145 mmol/L   Potassium 3.8 3.5 - 5.1 mmol/L   Chloride 104 98 - 111 mmol/L   CO2 26 22 - 32 mmol/L   Glucose, Bld 139 (H) 70 - 99 mg/dL    Comment: Glucose reference range applies only to samples taken after fasting for at least 8 hours.   BUN 8 8 - 23 mg/dL   Creatinine, Ser 0.98 0.44 - 1.00 mg/dL   Calcium 8.1 (L) 8.9 - 10.3 mg/dL   Total Protein 5.7 (L) 6.5 - 8.1 g/dL   Albumin 2.5 (L) 3.5 - 5.0 g/dL   AST 39 15 - 41 U/L   ALT 39 0 - 44 U/L   Alkaline Phosphatase 51 38 - 126 U/L   Total Bilirubin 1.3 (H) 0.3 - 1.2 mg/dL   GFR, Estimated >11 >91 mL/min    Comment: (NOTE) Calculated using the CKD-EPI Creatinine Equation (2021)    Anion gap 8 5 - 15    Comment: Performed at [facility], [address], [place], [place] [address]  CBC     Status: Abnormal   Collection Time: 07/27/22  5:46 AM  Result Value Ref Range   WBC 6.9 4.0 - 10.5 K/uL   RBC 3.13 (L) 3.87 - 5.11 MIL/uL   Hemoglobin 10.1 (L) 12.0 - 15.0 g/dL   HCT 56.2 (L) 13.0 - 86.5 %   MCV 100.6 (H) 80.0 - 100.0 fL   MCH 32.3 26.0 - 34.0 pg   MCHC 32.1 30.0 - 36.0 g/dL   RDW 78.4 69.6 -  29.5 %   Platelets 138 (L) 150 - 400 K/uL  nRBC 0.0 0.0 - 0.2 %    Comment: Performed at [facility], [address], [place], [place] [address]  Vitamin B12     Status: Abnormal   Collection Time: 07/27/22  5:46 AM  Result Value Ref Range   Vitamin B-12 1,112 (H) 180 - 914 pg/mL    Comment: (NOTE) This assay is not validated for testing neonatal or myeloproliferative syndrome specimens for Vitamin B12 levels. Performed at [facility], [address], [place], [place] [address]   Iron and TIBC     Status: Abnormal   Collection Time: 07/27/22  5:46 AM  Result Value Ref Range   Iron 114 28 - 170 ug/dL   TIBC 098 (L) 119 - 147 ug/dL   Saturation Ratios 60 (H) 10.4 - 31.8 %   UIBC 76 ug/dL    Comment: Performed at [facility], [address], [place], [place] [address]  Ferritin     Status: Abnormal   Collection Time: 07/27/22  5:46 AM  Result Value Ref Range   Ferritin 1,442 (H) 11 - 307 ng/mL    Comment: Performed at [facility], [address] [place], [place] [address]  Reticulocytes     Status: Abnormal   Collection Time: 07/27/22  5:46 AM  Result Value Ref Range   Retic Ct Pct 0.6 0.4 - 3.1 %   RBC. 3.17 (L) 3.87 - 5.11 MIL/uL   Retic Count, Absolute 18.4 (L) 19.0 - 186.0 K/uL   Immature Retic Fract 4.9 2.3 - 15.9 %    Comment: Performed at [facility], [address], [place], [place] [address]  Glucose, capillary     Status: Abnormal   Collection Time: 07/27/22  9:03 AM  Result Value Ref Range   Glucose-Capillary 163 (H) 70 - 99 mg/dL    Comment: Glucose reference range applies only to samples taken after fasting for at least 8 hours.  Glucose, capillary     Status: Abnormal   Collection Time: 07/27/22 11:15 AM  Result Value Ref Range   Glucose-Capillary 157 (H) 70 - 99 mg/dL    Comment: Glucose reference range applies only to samples taken after fasting for at least 8 hours.   Personally reviewed= large gallstone US Abdomen Limited RUQ (LIVER/GB)  Result Date:  07/26/2022 CLINICAL DATA:  Fever EXAM: ULTRASOUND ABDOMEN LIMITED RIGHT UPPER QUADRANT COMPARISON:  Ultrasound 07/16/2022, CT 07/15/2022 FINDINGS: Gallbladder: Distended gallbladder. Large shadowing stone within the proximal lumen measure up to 4.4 cm. Normal wall thickness. Positive sonographic Murphy. Sludge filled remaining gallbladder Common bile duct: Diameter: 5.6 mm Liver: Poorly visualized. Diffuse heterogeneous echogenic parenchyma. Portal vein is patent on color Doppler imaging with normal direction of blood flow towards the liver. Other: None. IMPRESSION: 1. Distended gallbladder with large shadowing stone and sludge. Positive sonographic Murphy's sign. Findings are suspicious for acute cholecystitis in the appropriate clinical setting. 2. Heterogeneous echogenic liver parenchyma consistent with hepatic steatosis and/or hepatocellular disease. Electronically Signed   By: Jasmine Pang M.D.   On: 07/26/2022 16:16   DG Chest Port 1 View  Result Date: 07/26/2022 CLINICAL DATA:  Cough, history non-small cell lung cancer EXAM: PORTABLE CHEST 1 VIEW COMPARISON:  Portable exam 1315 hours compared to 07/16/2022 and correlated with CT chest of 07/15/2022 FINDINGS: RIGHT jugular Port-A-Cath with tip projecting over SVC. Upper normal heart size. Mediastinal contours and pulmonary vascularity normal. Atherosclerotic calcification aorta. Lungs clear. No pulmonary infiltrate, pleural effusion, or pneumothorax. Nodular density 11 mm diameter projecting over the lateral RIGHT  upper lobe corresponds to a pulmonary nodule seen on recent CT, question metastasis. IMPRESSION: 11 mm pulmonary nodule RIGHT upper lobe consistent with metastatic disease. Aortic Atherosclerosis (ICD10-I70.0). Electronically Signed   By: Ulyses Southward M.D.   On: 07/26/2022 13:35     Assessment & Plan:  Carolyn Hooper is a 62 y.o. female with fever and vague pain and more in the lower abdomen. She is on chemotherapy and could have colitis  given the diarrhea.  Agree with zosyn given the fevers  Will get CT scan to ensure that no colitis  Diet as tolerated Gallstone but unsure if this is truly the  cause of the fever as she has no biliary colic and also has no pain in the upper abdomen   All questions were answered to the satisfaction of the patient.    Lucretia Roers 07/27/2022, 3:10 PM

## 2022-07-27 NOTE — Hospital Course (Signed)
62 y.o. 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 -Patient reports she felt febrile this morning, so she checked her temperature was 101, she does report some cough, congestion, white phlegm, she denies dysuria, polyuria, chest pain, shortness of breath, no hemoptysis, no leg pain or worsening edema, she does report rash on itching, does report some tenderness at the Port-A-Cath site, she reports she was stuck in it 7 times in ED last week, but site looks clean on physical exam. -In ED she was afebrile, leukocytosis, chest x-ray with no acute findings, UA is pending, upon ED physician discussion with Dr. Bertis Ruddy she from oncology, they recommended observation for 24 hours, so [facility] hospitalist consulted to admit.

## 2022-07-27 NOTE — TOC Progression Note (Signed)
  Transition of Care [facility]) Screening Note   Patient Details  Name: NABEEHA PFEIFLE Date of Birth: 18-Sep-1960   Transition of Care [facility]) CM/SW Contact:    Elliot Gault, LCSW Phone Number: 07/27/2022, 10:03 AM    Transition of Care Department [facility]) has reviewed patient and no TOC needs have been identified at this time. We will continue to monitor patient advancement through interdisciplinary progression rounds. If new patient transition needs arise, please place a TOC consult.

## 2022-07-27 NOTE — Progress Notes (Addendum)
PROGRESS NOTE   Carolyn Hooper  ZOX:[id number] DOB: 1960/04/13 DOA: 07/26/2022 PCP: [name], FNP   Chief Complaint  Patient presents with   Fever   Cough   Level of care: Med-Surg  Brief Admission History:  62 y.o. 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 -Patient reports she felt febrile this morning, so she checked her temperature was 101, she does report some cough, congestion, white phlegm, she denies dysuria, polyuria, chest pain, shortness of breath, no hemoptysis, no leg pain or worsening edema, she does report rash on itching, does report some tenderness at the Port-A-Cath site, she reports she was stuck in it 7 times in ED last week, but site looks clean on physical exam. -In ED she was afebrile, leukocytosis, chest x-ray with no acute findings, UA is pending, upon ED physician discussion with Dr. Bertis Ruddy she from oncology, they recommended observation for 24 hours, so [facility] hospitalist consulted to admit.   Assessment and Plan:  Question of cholecystitis  - [place] positive for gallbladder thickening and sludge  - IV ampicillin/sulbactam ordered per pharm D - general surgery consulted, I reached out to Dr [name]  - further recommendations to follow   Fever and chills in the immunocompromised patient  - concerned about bacteremia - fortunately blood cultures taken on admission - repeat BC x 2 for fever>101 - IV antibiotics started   Secondary diabetes mellitus, type 2 with hyperglycemia, uncontrolled - uncontrolled as evidenced by A1c of 8.2%  - holding home metformin while in hospital  - continue SSI  coverage and frequent CBG monitoring   CBG (last 3)  Recent Labs    07/26/22 2037 07/27/22 0903 07/27/22 1115  GLUCAP 146* 163* 157*   Stage IV lung cancer with brain metastases - Pt is being actively treated now by Dr. Arbutus Ped on chemotherapy  - Pt is receiving keppra BID for seizure prevention due to brain mets - Pt recently completed a steroid taper.   Hyponatremia  - from dehydration  - treated with IV fluids and resolved now  Hypokalemia  - this has been repleted   Anemia in neoplastic disease  -MCV elevated  - Hg down with hydration (hemodilution)  - recheck and follow CBC   Thrombocytopenia  - likely from heparin - may need to stop it  - does not appear to have sepsis at this time - follow    DVT prophylaxis: [place] heparin  Code Status: Full  Family Communication:     Consultants:  surgery Procedures:   Antimicrobials:  Zosyn IV 5/11>>  Subjective: Pt says that she is having "chills" also having some abdominal pain.   Objective: Vitals:   07/27/22 0604 07/27/22 0847 07/27/22 0906 07/27/22 1000  BP: (!) 122/46 (!) 123/52  (!) 109/41  Pulse: 81 80  79  Resp: 18 18  18   Temp: 98.9 F (37.2 C)  98.1 F (36.7 C) 99.1 F (37.3 C)  TempSrc:   Oral Oral  SpO2: 98% 96%  97%    Intake/Output Summary (Last 24 hours) at 07/27/2022 1327 Last data filed at 07/27/2022 1100 Gross per 24 hour  Intake 312.89 ml  Output --  Net 312.89 ml   Examination:  General exam: Appears calm and comfortable  Respiratory system: Clear to auscultation. Respiratory effort normal. Cardiovascular system: normal S1 & S2 heard. No JVD, murmurs, rubs, gallops or clicks. No pedal edema. Gastrointestinal system: Abdomen is soft and tender in RUQ. No organomegaly or masses felt. Normal bowel sounds heard. Central nervous system: Alert and oriented. No focal neurological deficits. Extremities: Symmetric 5 x 5 power. Skin: No rashes, lesions or ulcers. Psychiatry: Judgement and  insight appear normal. Mood & affect appropriate.   Data Reviewed: I have personally reviewed following labs and imaging studies  CBC: Recent Labs  Lab 07/22/22 1403 07/26/22 1300 07/27/22 0546  WBC 14.6* 7.9 6.9  NEUTROABS 9.0* 5.6  --   HGB 14.2 11.0* 10.1*  HCT 43.3 34.4* 31.5*  MCV 100.7* 99.7 100.6*  PLT 178 154 138*    Basic Metabolic Panel: Recent Labs  Lab 07/22/22 1403 07/26/22 1300 07/27/22 0546  NA 137 134* 138  K 3.8 3.3* 3.8  CL 101 98 104  CO2 26 24 26   GLUCOSE 191* 178* 139*  BUN 12 7* 8  CREATININE 1.12* 0.78 0.69  CALCIUM 9.1 8.3* 8.1*    CBG: Recent Labs  Lab 07/26/22 1708 07/26/22 2037 07/27/22 0903 07/27/22 1115  GLUCAP 157* 146* 163* 157*    Recent Results (from the past 240 hour(s))  Resp panel by RT-PCR (RSV, Flu A&B, Covid) Anterior Nasal Swab     Status: None   Collection Time: 07/26/22  2:19 PM   Specimen: Anterior Nasal Swab  Result Value Ref Range Status   SARS Coronavirus 2 by RT PCR NEGATIVE NEGATIVE Final    Comment: (NOTE) SARS-CoV-2 target nucleic acids are NOT DETECTED.  The SARS-CoV-2 RNA is generally detectable in upper respiratory specimens during the acute phase of infection. The lowest concentration of SARS-CoV-2 viral copies this assay can detect is 138 copies/mL. A negative result does not preclude SARS-Cov-2 infection and should not be used as the sole basis for treatment or other patient management decisions. A negative result may occur with  improper specimen collection/handling, submission of specimen other than nasopharyngeal swab, presence of viral mutation(s) within the areas targeted by this assay, and inadequate number of viral copies(<138 copies/mL). A negative result must be combined with clinical observations, patient history, and epidemiological information. The expected result is Negative.  Fact Sheet for Patients:  BloggerCourse.com  Fact Sheet for Healthcare Providers:   SeriousBroker.it  This test is no t yet approved or cleared by the [place] FDA and  has been authorized for detection and/or diagnosis of SARS-CoV-2 by FDA under an Emergency Use Authorization (EUA). This EUA will remain  in effect (meaning this test can be used) for the duration of the COVID-19 declaration under Section 564(b)(1) of the Act, 21 U.S.C.section 360bbb-3(b)(1), unless the authorization is terminated  or revoked sooner.       Influenza A by PCR NEGATIVE NEGATIVE Final   Influenza B by PCR NEGATIVE NEGATIVE Final    Comment: (NOTE) The Xpert Xpress SARS-CoV-2/FLU/RSV plus assay is intended as an aid in the diagnosis of influenza from Nasopharyngeal swab specimens and should not be used as a sole basis for treatment. Nasal washings and aspirates are unacceptable for Xpert Xpress SARS-CoV-2/FLU/RSV testing.  Fact Sheet for Patients: BloggerCourse.com  Fact Sheet for Healthcare Providers: SeriousBroker.it  This test is not yet approved or cleared by the [place] FDA and has been authorized for detection and/or diagnosis of SARS-CoV-2 by FDA under an Emergency Use Authorization (EUA).  This EUA will remain in effect (meaning this test can be used) for the duration of the COVID-19 declaration under Section 564(b)(1) of the Act, 21 U.S.C. section 360bbb-3(b)(1), unless the authorization is terminated or revoked.     Resp Syncytial Virus by PCR NEGATIVE NEGATIVE Final    Comment: (NOTE) Fact Sheet for Patients: BloggerCourse.com  Fact Sheet for Healthcare Providers: SeriousBroker.it  This test is not yet approved or cleared by the [place] FDA and has been authorized for detection and/or diagnosis of SARS-CoV-2 by FDA under an Emergency Use Authorization (EUA). This EUA will remain in effect (meaning this test can be used) for  the duration of the COVID-19 declaration under Section 564(b)(1) of the Act, 21 U.S.C. section 360bbb-3(b)(1), unless the authorization is terminated or revoked.  Performed at [facility], [address] [place], [place] [address]   Respiratory (~20 pathogens) panel by PCR     Status: None   Collection Time: 07/26/22  3:25 PM   Specimen: Nasopharyngeal Swab; Respiratory  Result Value Ref Range Status   Adenovirus NOT DETECTED NOT DETECTED Final   Coronavirus 229E NOT DETECTED NOT DETECTED Final    Comment: (NOTE) The Coronavirus on the Respiratory Panel, DOES NOT test for the novel  Coronavirus (2019 nCoV)    Coronavirus HKU1 NOT DETECTED NOT DETECTED Final   Coronavirus NL63 NOT DETECTED NOT DETECTED Final   Coronavirus OC43 NOT DETECTED NOT DETECTED Final   Metapneumovirus NOT DETECTED NOT DETECTED Final   Rhinovirus / Enterovirus NOT DETECTED NOT DETECTED Final   Influenza A NOT DETECTED NOT DETECTED Final   Influenza B NOT DETECTED NOT DETECTED Final   Parainfluenza Virus 1 NOT DETECTED NOT DETECTED Final   Parainfluenza Virus 2 NOT DETECTED NOT DETECTED Final   Parainfluenza Virus 3 NOT DETECTED NOT DETECTED Final   Parainfluenza Virus 4 NOT DETECTED NOT DETECTED Final   Respiratory Syncytial Virus NOT DETECTED NOT DETECTED Final   Bordetella pertussis NOT DETECTED NOT DETECTED Final   Bordetella Parapertussis NOT DETECTED NOT DETECTED Final   Chlamydophila pneumoniae NOT DETECTED NOT DETECTED Final   Mycoplasma pneumoniae NOT DETECTED NOT DETECTED Final    Comment: Performed at [facility], [address], [place], [place] [address]  Culture, blood (routine x 2)     Status: None (Preliminary result)   Collection Time: 07/26/22  3:30 PM   Specimen: BLOOD  Result Value Ref Range Status   Specimen Description BLOOD BLOOD LEFT HAND  Final   Special Requests   Final    BOTTLES DRAWN AEROBIC AND ANAEROBIC Blood Culture adequate volume Performed at [facility],  [address], [place], [place] [address]    Culture PENDING  Incomplete   Report Status PENDING  Incomplete  Culture, blood (routine x 2)     Status: None (Preliminary result)   Collection Time: 07/26/22  3:31 PM   Specimen: BLOOD  Result Value Ref Range Status   Specimen Description BLOOD BLOOD LEFT ARM  Final   Special Requests   Final    BOTTLES DRAWN AEROBIC AND ANAEROBIC Blood Culture adequate volume Performed at [facility], [address] [place], [place] [address]    Culture PENDING  Incomplete   Report Status PENDING  Incomplete     Radiology Studies: US Abdomen Limited RUQ (LIVER/GB)  Result Date: 07/26/2022 CLINICAL DATA:  Fever EXAM: ULTRASOUND ABDOMEN LIMITED RIGHT UPPER QUADRANT COMPARISON:  Ultrasound 07/16/2022, CT 07/15/2022 FINDINGS: Gallbladder: Distended gallbladder. Large shadowing stone within the proximal  lumen measure up to 4.4 cm. Normal wall thickness. Positive sonographic Murphy. Sludge filled remaining gallbladder Common bile duct: Diameter: 5.6 mm Liver: Poorly visualized. Diffuse heterogeneous echogenic parenchyma. Portal vein is patent on color Doppler imaging with normal direction of blood flow towards the liver. Other: None. IMPRESSION: 1. Distended gallbladder with large shadowing stone and sludge. Positive sonographic Murphy's sign. Findings are suspicious for acute cholecystitis in the appropriate clinical setting. 2. Heterogeneous echogenic liver parenchyma consistent with hepatic steatosis and/or hepatocellular disease. Electronically Signed   By: Jasmine Pang M.D.   On: 07/26/2022 16:16   DG Chest Port 1 View  Result Date: 07/26/2022 CLINICAL DATA:  Cough, history non-small cell lung cancer EXAM: PORTABLE CHEST 1 VIEW COMPARISON:  Portable exam 1315 hours compared to 07/16/2022 and correlated with CT chest of 07/15/2022 FINDINGS: RIGHT jugular Port-A-Cath with tip projecting over SVC. Upper normal heart size. Mediastinal contours and pulmonary vascularity  normal. Atherosclerotic calcification aorta. Lungs clear. No pulmonary infiltrate, pleural effusion, or pneumothorax. Nodular density 11 mm diameter projecting over the lateral RIGHT upper lobe corresponds to a pulmonary nodule seen on recent CT, question metastasis. IMPRESSION: 11 mm pulmonary nodule RIGHT upper lobe consistent with metastatic disease. Aortic Atherosclerosis (ICD10-I70.0). Electronically Signed   By: Ulyses Southward M.D.   On: 07/26/2022 13:35    Scheduled Meds:  clonazePAM  0.5 mg Oral QHS   dextromethorphan-guaiFENesin  1 tablet Oral BID   folic acid  1 mg Oral Daily   heparin injection (subcutaneous)  5,000 Units Subcutaneous Q8H   insulin aspart  0-15 Units Subcutaneous TID WC   insulin aspart  0-5 Units Subcutaneous QHS   insulin glargine-yfgn  8 Units Subcutaneous Daily   levETIRAcetam  1,000 mg Oral BID   Continuous Infusions:  piperacillin-tazobactam (ZOSYN)  IV 3.375 g (07/27/22 1205)     LOS: 0 days   Time spent: 39 mins  Dannilynn Gallina Laural Benes, MD How to contact the [facility] Attending or Consulting provider 7A - 7P or covering provider during after hours 7P -7A, for this patient?  Check the care team in [facility] and look for a) attending/consulting [facility] provider listed and b) the [facility] team listed Log into [email or web address] and use [place]'s universal password to access. If you do not have the password, please contact the hospital operator. Locate the [facility] provider you are looking for under [facility] and page to a number that you can be directly reached. If you still have difficulty reaching the provider, please page the [facility] (Director on Call) for the Hospitalists listed on amion for assistance.  07/27/2022, 1:27 PM

## 2022-07-28 DIAGNOSIS — R509 Fever, unspecified: Secondary | ICD-10-CM | POA: Diagnosis not present

## 2022-07-28 DIAGNOSIS — R1030 Lower abdominal pain, unspecified: Secondary | ICD-10-CM

## 2022-07-28 DIAGNOSIS — K8020 Calculus of gallbladder without cholecystitis without obstruction: Secondary | ICD-10-CM | POA: Diagnosis not present

## 2022-07-28 LAB — CBC WITH DIFFERENTIAL/PLATELET
Abs Immature Granulocytes: 0.09 10*3/uL — ABNORMAL HIGH (ref 0.00–0.07)
Basophils Absolute: 0 10*3/uL (ref 0.0–0.1)
Basophils Relative: 0 %
Eosinophils Absolute: 0.5 10*3/uL (ref 0.0–0.5)
Eosinophils Relative: 11 %
HCT: 29.9 % — ABNORMAL LOW (ref 36.0–46.0)
Hemoglobin: 9.6 g/dL — ABNORMAL LOW (ref 12.0–15.0)
Immature Granulocytes: 2 %
Lymphocytes Relative: 13 %
Lymphs Abs: 0.6 10*3/uL — ABNORMAL LOW (ref 0.7–4.0)
MCH: 32.5 pg (ref 26.0–34.0)
MCHC: 32.1 g/dL (ref 30.0–36.0)
MCV: 101.4 fL — ABNORMAL HIGH (ref 80.0–100.0)
Monocytes Absolute: 0.1 10*3/uL (ref 0.1–1.0)
Monocytes Relative: 2 %
Neutro Abs: 3.2 10*3/uL (ref 1.7–7.7)
Neutrophils Relative %: 72 %
Platelets: 103 10*3/uL — ABNORMAL LOW (ref 150–400)
RBC: 2.95 MIL/uL — ABNORMAL LOW (ref 3.87–5.11)
RDW: 14.6 % (ref 11.5–15.5)
WBC: 4.5 10*3/uL (ref 4.0–10.5)
nRBC: 0 % (ref 0.0–0.2)

## 2022-07-28 LAB — COMPREHENSIVE METABOLIC PANEL
ALT: 32 U/L (ref 0–44)
AST: 27 U/L (ref 15–41)
Albumin: 2.5 g/dL — ABNORMAL LOW (ref 3.5–5.0)
Alkaline Phosphatase: 47 U/L (ref 38–126)
Anion gap: 9 (ref 5–15)
BUN: 8 mg/dL (ref 8–23)
CO2: 26 mmol/L (ref 22–32)
Calcium: 7.9 mg/dL — ABNORMAL LOW (ref 8.9–10.3)
Chloride: 102 mmol/L (ref 98–111)
Creatinine, Ser: 0.83 mg/dL (ref 0.44–1.00)
GFR, Estimated: >60 mL/min (ref >60)
Glucose, Bld: 184 mg/dL — ABNORMAL HIGH (ref 70–99)
Potassium: 4.1 mmol/L (ref 3.5–5.1)
Sodium: 137 mmol/L (ref 135–145)
Total Bilirubin: 1.2 mg/dL (ref 0.3–1.2)
Total Protein: 5.5 g/dL — ABNORMAL LOW (ref 6.5–8.1)

## 2022-07-28 LAB — GLUCOSE, CAPILLARY
Glucose-Capillary: 182 mg/dL — ABNORMAL HIGH (ref 70–99)
Glucose-Capillary: 148 mg/dL — ABNORMAL HIGH (ref 70–99)
Glucose-Capillary: 178 mg/dL — ABNORMAL HIGH (ref 70–99)
Glucose-Capillary: 268 mg/dL — ABNORMAL HIGH (ref 70–99)

## 2022-07-28 MED ORDER — DIPHENHYDRAMINE HCL 25 MG PO CAPS
50.0000 mg | ORAL_CAPSULE | Freq: Four times a day (QID) | ORAL | Status: DC | PRN
  Filled 2022-07-28: qty 2

## 2022-07-28 MED ORDER — PREDNISONE 20 MG PO TABS
40.0000 mg | ORAL_TABLET | Freq: Once | ORAL | Status: AC
  Administered 2022-07-28: 40 mg via ORAL
  Filled 2022-07-28: qty 2

## 2022-07-28 NOTE — Progress Notes (Signed)
PROGRESS NOTE   Carolyn Hooper  WUJ:[id number] DOB: Oct 05, 1960 DOA: 07/26/2022 PCP: [name], FNP   Chief Complaint  Patient presents with   Fever   Cough   Level of care: Med-Surg  Brief Admission History:  62 y.o. 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 -Patient reports she felt febrile this morning, so she checked her temperature was 101, she does report some cough, congestion, white phlegm, she denies dysuria, polyuria, chest pain, shortness of breath, no hemoptysis, no leg pain or worsening edema, she does report rash on itching, does report some tenderness at the Port-A-Cath site, she reports she was stuck in it 7 times in ED last week, but site looks clean on physical exam. -In ED she was afebrile, leukocytosis, chest x-ray with no acute findings, UA is pending, upon ED physician discussion with Dr. Bertis Ruddy she from oncology, they recommended observation for 24 hours, so [facility] hospitalist consulted to admit.   Assessment and Plan:  Question of cholecystitis  - [place] positive for gallbladder thickening and sludge  - IV ampicillin/sulbactam ordered per pharm D - general surgery consulted, I reached out to Dr [name], planning HIDA scan 5/13 - further recommendations to follow   Fever and chills in the immunocompromised patient  - concerned about bacteremia - fortunately blood cultures taken on admission - repeat BC x 2 for fever>101 - IV antibiotics started 5/11  - repeat CBCd in AM   Secondary diabetes mellitus, type 2 with hyperglycemia, uncontrolled - uncontrolled as evidenced by A1c of 8.2%  - holding  home metformin while in hospital  - continue SSI coverage and frequent CBG monitoring   CBG (last 3)  Recent Labs    07/28/22 0749 07/28/22 1129 07/28/22 1656  GLUCAP 182* 148* 178*   Stage IV lung cancer with brain metastases - Pt is being actively treated now by Dr. Arbutus Ped on chemotherapy  - Pt is receiving keppra BID for seizure prevention due to brain mets - Pt recently completed a steroid taper.   Hyponatremia  - from dehydration  - treated with IV fluids and resolved now  Hypokalemia  - this has been repleted   Anemia in neoplastic disease  -MCV elevated  - Hg down with hydration (hemodilution)  - recheck and follow CBC   Thrombocytopenia  - likely from heparin - may need to stop it  - does not appear to have sepsis at this time - follow   Rash - cause unknown - pt says it started after chemotherapy - benadryl ordered, prednisone 40 mg x 1 dose - topical hydrocortisone creme ordered   DVT prophylaxis: [place] heparin  Code Status: Full  Family Communication:     Consultants:  surgery Procedures:   Antimicrobials:  Zosyn IV 5/11>>   Subjective: No chills today. Rash a little wider and more pruritic.    Objective: Vitals:   07/27/22 2357 07/28/22 0319 07/28/22 1226 07/28/22 1426  BP:  123/67 120/61   Pulse:  98 100   Resp:  18 14   Temp:  99.2 F (37.3 C) 99 F (37.2 C) 98.2 F (36.8 C)  TempSrc:  Oral Oral Axillary  SpO2: 94% 96% 96%  Intake/Output Summary (Last 24 hours) at 07/28/2022 1715 Last data filed at 07/28/2022 0534 Gross per 24 hour  Intake 690 ml  Output --  Net 690 ml   Examination:  General exam: Appears calm and comfortable  Respiratory system: Clear to auscultation. Respiratory effort normal. Cardiovascular system: normal S1 & S2 heard. No JVD, murmurs, rubs, gallops or clicks. No pedal edema. Gastrointestinal system: Abdomen is soft and tender in RUQ. No organomegaly or masses felt. Normal bowel sounds heard. Central  nervous system: Alert and oriented. No focal neurological deficits. Extremities: Symmetric 5 x 5 power. Skin: diffuse macular rash seen on trunk and extremities. No lesions or ulcers. Psychiatry: Judgement and insight appear normal. Mood & affect appropriate.   Data Reviewed: I have personally reviewed following labs and imaging studies  CBC: Recent Labs  Lab 07/22/22 1403 07/26/22 1300 07/27/22 0546 07/28/22 0857  WBC 14.6* 7.9 6.9 4.5  NEUTROABS 9.0* 5.6  --  3.2  HGB 14.2 11.0* 10.1* 9.6*  HCT 43.3 34.4* 31.5* 29.9*  MCV 100.7* 99.7 100.6* 101.4*  PLT 178 154 138* 103*    Basic Metabolic Panel: Recent Labs  Lab 07/22/22 1403 07/26/22 1300 07/27/22 0546 07/28/22 0857  NA 137 134* 138 137  K 3.8 3.3* 3.8 4.1  CL 101 98 104 102  CO2 26 24 26 26   GLUCOSE 191* 178* 139* 184*  BUN 12 7* 8 8  CREATININE 1.12* 0.78 0.69 0.83  CALCIUM 9.1 8.3* 8.1* 7.9*    CBG: Recent Labs  Lab 07/27/22 1635 07/27/22 1951 07/28/22 0749 07/28/22 1129 07/28/22 1656  GLUCAP 154* 140* 182* 148* 178*    Recent Results (from the past 240 hour(s))  Resp panel by RT-PCR (RSV, Flu A&B, Covid) Anterior Nasal Swab     Status: None   Collection Time: 07/26/22  2:19 PM   Specimen: Anterior Nasal Swab  Result Value Ref Range Status   SARS Coronavirus 2 by RT PCR NEGATIVE NEGATIVE Final    Comment: (NOTE) SARS-CoV-2 target nucleic acids are NOT DETECTED.  The SARS-CoV-2 RNA is generally detectable in upper respiratory specimens during the acute phase of infection. The lowest concentration of SARS-CoV-2 viral copies this assay can detect is 138 copies/mL. A negative result does not preclude SARS-Cov-2 infection and should not be used as the sole basis for treatment or other patient management decisions. A negative result may occur with  improper specimen collection/handling, submission of specimen other than nasopharyngeal swab, presence of viral mutation(s) within the areas targeted by  this assay, and inadequate number of viral copies(<138 copies/mL). A negative result must be combined with clinical observations, patient history, and epidemiological information. The expected result is Negative.  Fact Sheet for Patients:  BloggerCourse.com  Fact Sheet for Healthcare Providers:  SeriousBroker.it  This test is no t yet approved or cleared by the [place] FDA and  has been authorized for detection and/or diagnosis of SARS-CoV-2 by FDA under an Emergency Use Authorization (EUA). This EUA will remain  in effect (meaning this test can be used) for the duration of the COVID-19 declaration under Section 564(b)(1) of the Act, 21 U.S.C.section 360bbb-3(b)(1), unless the authorization is terminated  or revoked sooner.       Influenza A by PCR NEGATIVE NEGATIVE Final   Influenza B by PCR NEGATIVE NEGATIVE Final    Comment: (NOTE) The Xpert Xpress SARS-CoV-2/FLU/RSV plus assay is intended as an aid in the diagnosis of influenza from Nasopharyngeal swab specimens and should not be used as  a sole basis for treatment. Nasal washings and aspirates are unacceptable for Xpert Xpress SARS-CoV-2/FLU/RSV testing.  Fact Sheet for Patients: BloggerCourse.com  Fact Sheet for Healthcare Providers: SeriousBroker.it  This test is not yet approved or cleared by the [place] FDA and has been authorized for detection and/or diagnosis of SARS-CoV-2 by FDA under an Emergency Use Authorization (EUA). This EUA will remain in effect (meaning this test can be used) for the duration of the COVID-19 declaration under Section 564(b)(1) of the Act, 21 U.S.C. section 360bbb-3(b)(1), unless the authorization is terminated or revoked.     Resp Syncytial Virus by PCR NEGATIVE NEGATIVE Final    Comment: (NOTE) Fact Sheet for Patients: BloggerCourse.com  Fact Sheet  for Healthcare Providers: SeriousBroker.it  This test is not yet approved or cleared by the [place] FDA and has been authorized for detection and/or diagnosis of SARS-CoV-2 by FDA under an Emergency Use Authorization (EUA). This EUA will remain in effect (meaning this test can be used) for the duration of the COVID-19 declaration under Section 564(b)(1) of the Act, 21 U.S.C. section 360bbb-3(b)(1), unless the authorization is terminated or revoked.  Performed at [facility], [address], [place], [place] [address]   Respiratory (~20 pathogens) panel by PCR     Status: None   Collection Time: 07/26/22  3:25 PM   Specimen: Nasopharyngeal Swab; Respiratory  Result Value Ref Range Status   Adenovirus NOT DETECTED NOT DETECTED Final   Coronavirus 229E NOT DETECTED NOT DETECTED Final    Comment: (NOTE) The Coronavirus on the Respiratory Panel, DOES NOT test for the novel  Coronavirus (2019 nCoV)    Coronavirus HKU1 NOT DETECTED NOT DETECTED Final   Coronavirus NL63 NOT DETECTED NOT DETECTED Final   Coronavirus OC43 NOT DETECTED NOT DETECTED Final   Metapneumovirus NOT DETECTED NOT DETECTED Final   Rhinovirus / Enterovirus NOT DETECTED NOT DETECTED Final   Influenza A NOT DETECTED NOT DETECTED Final   Influenza B NOT DETECTED NOT DETECTED Final   Parainfluenza Virus 1 NOT DETECTED NOT DETECTED Final   Parainfluenza Virus 2 NOT DETECTED NOT DETECTED Final   Parainfluenza Virus 3 NOT DETECTED NOT DETECTED Final   Parainfluenza Virus 4 NOT DETECTED NOT DETECTED Final   Respiratory Syncytial Virus NOT DETECTED NOT DETECTED Final   Bordetella pertussis NOT DETECTED NOT DETECTED Final   Bordetella Parapertussis NOT DETECTED NOT DETECTED Final   Chlamydophila pneumoniae NOT DETECTED NOT DETECTED Final   Mycoplasma pneumoniae NOT DETECTED NOT DETECTED Final    Comment: Performed at [facility], [address], [place], [place] [address]  Culture,  blood (routine x 2)     Status: None (Preliminary result)   Collection Time: 07/26/22  3:30 PM   Specimen: BLOOD  Result Value Ref Range Status   Specimen Description BLOOD BLOOD LEFT HAND  Final   Special Requests   Final    BOTTLES DRAWN AEROBIC AND ANAEROBIC Blood Culture adequate volume   Culture   Final    NO GROWTH 2 DAYS Performed at [facility], [address] [place], [place] [address]    Report Status PENDING  Incomplete  Culture, blood (routine x 2)     Status: None (Preliminary result)   Collection Time: 07/26/22  3:31 PM   Specimen: BLOOD  Result Value Ref Range Status   Specimen Description BLOOD BLOOD LEFT ARM  Final   Special Requests   Final    BOTTLES DRAWN AEROBIC AND ANAEROBIC Blood Culture adequate  volume   Culture   Final    NO GROWTH 2 DAYS Performed at [facility], [address] [place], [place] [address]    Report Status PENDING  Incomplete     Radiology Studies: CT ABDOMEN PELVIS W CONTRAST  Result Date: 07/27/2022 CLINICAL DATA:  Left lower quadrant pain with diarrhea, history of lung carcinoma, initial encounter EXAM: CT ABDOMEN AND PELVIS WITH CONTRAST TECHNIQUE: Multidetector CT imaging of the abdomen and pelvis was performed using the standard protocol following bolus administration of intravenous contrast. RADIATION DOSE REDUCTION: This exam was performed according to the departmental dose-optimization program which includes automated exposure control, adjustment of the mA and/or kV according to patient size and/or use of iterative reconstruction technique. CONTRAST:  OMNIPAQUE IOHEXOL 300 MG/ML  SOLN COMPARISON:  07/15/2022 FINDINGS: Lower chest: Right lung base is clear. Left lung base again demonstrates a medial soft tissue mass which measures 5.6 x 4.2 cm in greatest AP and transverse dimensions respectively. This is relatively stable from the recent exam. Small left effusion is noted slightly increased when compared with the prior exam.  Hepatobiliary: Fatty infiltration of the liver is noted. Gallbladder is well distended with vicarious excretion of contrast material as well as a large lamellated gallstone. Area of focal fatty infiltration is noted within the liver. This is stable from the prior study. Pancreas: Hypodensity is again noted in the head of the pancreas measuring approximately 7 mm stable in appearance from the prior exam. It is somewhat less well visualized on the current exam. Spleen: Normal in size without focal abnormality. Adrenals/Urinary Tract: Adrenal glands are within normal limits. Kidneys demonstrate a normal enhancement pattern bilaterally. No renal calculi or obstructive changes are seen. The bladder is within normal limits. Stomach/Bowel: Scattered diverticular change of the colon is noted without evidence of diverticulitis. The appendix is not well visualized and may have been surgically removed. No inflammatory changes are seen. The small bowel and stomach are within normal limits. Vascular/Lymphatic: Aortic atherosclerosis. No enlarged abdominal or pelvic lymph nodes. Small periaortic and intra-aortocaval nodes are seen similar to that seen on prior exam. Reproductive: Status post hysterectomy. No adnexal masses. Other: No abdominal wall hernia or abnormality. No abdominopelvic ascites. Musculoskeletal: Degenerative changes of lumbar spine are noted. No acute abnormality is seen. IMPRESSION: Stable appearing left lower lobe mass lesion consistent with the known clinical history. Fatty infiltration of the liver. Cholelithiasis without complicating factors. Stable cystic appearing lesion in the head of the pancreas. This can be followed on routine oncology follow-up imaging. Diverticulosis without diverticulitis. Electronically Signed   By: Alcide Clever M.D.   On: 07/27/2022 21:19    Scheduled Meds:  Chlorhexidine Gluconate Cloth  6 each Topical Daily   clonazePAM  0.5 mg Oral QHS   dextromethorphan-guaiFENesin  1  tablet Oral BID   folic acid  1 mg Oral Daily   heparin injection (subcutaneous)  5,000 Units Subcutaneous Q8H   insulin aspart  0-15 Units Subcutaneous TID WC   insulin aspart  0-5 Units Subcutaneous QHS   insulin glargine-yfgn  8 Units Subcutaneous Daily   levETIRAcetam  1,000 mg Oral BID   Continuous Infusions:  sodium chloride 35 mL/hr at 07/28/22 1655   piperacillin-tazobactam (ZOSYN)  IV 3.375 g (07/28/22 1430)     LOS: 1 day   Time spent: 35 mins  [place] Laural Benes, MD How to contact the [facility] Attending or Consulting provider 7A - 7P or covering provider during after hours 7P -7A, for this patient?  Check the care team in [facility] and look for a) attending/consulting TRH provider listed and b) the [facility] team listed Log into [email or web address] and use [place]'s universal password to access. If you do not have the password, please contact the hospital operator. Locate the [facility] provider you are looking for under [facility] and page to a number that you can be directly reached. If you still have difficulty reaching the provider, please page the [facility] (Director on Call) for the Hospitalists listed on amion for assistance.  07/28/2022, 5:15 PM

## 2022-07-28 NOTE — TOC Initial Note (Signed)
Transition of Care [facility]) - Initial/Assessment Note    Patient Details  Name: Carolyn Hooper MRN: [id number] Date of Birth: 12-29-1960  Transition of Care [facility]) CM/SW Contact:    Villa Herb, LCSWA Phone Number: 07/28/2022, 11:17 AM  Clinical Narrative:                 Pt is high risk for readmission. CSW spoke with pts son to complete assessment. Pt has a family friend that also lives in the home with pt. Pt is independent in completing her ADLs. Pt is able to drive to appoints however family is available to assist if needed. Pt does not use and DME in the home. TOC to follow.   Expected Discharge Plan: Home/Self Care Barriers to Discharge: Continued Medical Work up   Patient Goals and CMS Choice Patient states their goals for this hospitalization and ongoing recovery are:: return home          Expected Discharge Plan and Services In-house Referral: Clinical Social Work Discharge Planning Services: CM Consult   Living arrangements for the past 2 months: Single Family Home                                      Prior Living Arrangements/Services Living arrangements for the past 2 months: Single Family Home Lives with:: Self, Friends Patient language and need for interpreter reviewed:: Yes Do you feel safe going back to the place where you live?: Yes      Need for Family Participation in Patient Care: Yes (Comment) Care giver support system in place?: Yes (comment)   Criminal Activity/Legal Involvement Pertinent to Current Situation/Hospitalization: No - Comment as needed  Activities of Daily Living Home Assistive Devices/Equipment: None ADL Screening (condition at time of admission) Patient's cognitive ability adequate to safely complete daily activities?: Yes Is the patient deaf or have difficulty hearing?: Yes Does the patient have difficulty seeing, even when wearing glasses/contacts?: No Does the patient have difficulty concentrating, remembering, or  making decisions?: No Patient able to express need for assistance with ADLs?: Yes Does the patient have difficulty dressing or bathing?: No Independently performs ADLs?: Yes (appropriate for developmental age) Does the patient have difficulty walking or climbing stairs?: Yes Weakness of Legs: Both Weakness of Arms/Hands: Both  Permission Sought/Granted                  Emotional Assessment Appearance:: Appears stated age Attitude/Demeanor/Rapport: Engaged Affect (typically observed): Accepting   Alcohol / Substance Use: Not Applicable Psych Involvement: No (comment)  Admission diagnosis:  Fever [R50.9] Fever, unspecified fever cause [R50.9] Fever, unknown origin [R50.9] Patient Active Problem List   Diagnosis Date Noted   Cholecystitis, acute 07/27/2022   Fever, unknown origin 07/27/2022   Lower abdominal pain 07/27/2022   Diarrhea 07/27/2022   Fever 07/26/2022   Cholelithiasis 07/16/2022   Primary insomnia 07/12/2022   Swelling 06/20/2022   Encounter for antineoplastic chemotherapy 06/05/2022   Encounter for antineoplastic immunotherapy 06/05/2022   Port-A-Cath in place 04/24/2022   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 [facility]) 03/07/2022   Focal seizure (HCC) 03/07/2022   Non-small cell lung cancer (HCC) 02/25/2022   Goals of care, counseling/discussion 02/25/2022   Lung mass 02/13/2022   Subarachnoid hemorrhage (HCC) 02/12/2022   Aphasia 02/12/2022   Brain mass 02/12/2022   Leukocytosis 02/12/2022   Mass of lower lobe of left lung 02/12/2022  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   Refusal of statin medication by patient 07/10/2020   Myalgia due to statin 07/10/2020   Controlled substance agreement signed 05/28/2018   Benzodiazepine dependence (HCC) 05/28/2018   Hyperlipidemia associated with type 2 diabetes mellitus (HCC) 04/11/2016   Morbid obesity (HCC) 04/11/2016   Former smoker 04/11/2016   Hidradenitis suppurativa 04/11/2016    GAD (generalized anxiety disorder) 08/19/2013   Uncontrolled type 2 diabetes mellitus with hyperglycemia, without long-term current use of insulin (HCC) 08/19/2013   Hyperinsulinemia 08/19/2013   PCP:  [name], FNP Pharmacy:   [facility] [address] [place] [address] Phone: [phone] Fax: [phone]     Social Determinants of Health (SDOH) Social History: SDOH Screenings   Food Insecurity: No Food Insecurity (07/26/2022)  Housing: Low Risk  (07/26/2022)  Transportation Needs: No Transportation Needs (07/26/2022)  Utilities: Not At Risk (07/26/2022)  Depression (PHQ2-9): Medium Risk (07/12/2022)  Tobacco Use: Medium Risk (07/26/2022)   SDOH Interventions:     Readmission Risk Interventions    07/28/2022   11:16 AM  Readmission Risk Prevention Plan  Transportation Screening Complete  HRI or Home Care Consult Complete  Social Work Consult for Recovery Care Planning/Counseling Complete  Palliative Care Screening Not Applicable  Medication Review Oceanographer) Complete

## 2022-07-28 NOTE — Progress Notes (Signed)
[facility] Progress Note     Subjective: With some pain and nausea/ did not eat much. CT reassuring with no colitis, no gallbladder inflammation.   Objective: Vital signs in last 24 hours: Temp:  [99 F (37.2 C)-99.3 F (37.4 C)] 99 F (37.2 C) (05/12 1226) Pulse Rate:  [84-100] 100 (05/12 1226) Resp:  [14-18] 14 (05/12 1226) BP: (108-123)/(40-67) 120/61 (05/12 1226) SpO2:  [94 %-100 %] 96 % (05/12 1226) Last BM Date : 07/28/22  Intake/Output from previous day: 05/11 0701 - 05/12 0700 In: 970.7 [P.O.:480; I.V.:450; IV Piggyback:40.7] Out: -  Intake/Output this shift: No intake/output data recorded.  General appearance: alert and no distress GI: soft, minimally tender  Skin: Skin color, texture, turgor normal. No rashes or lesions or rash with warmth on extremities and back  Lab Results:  Recent Labs    07/27/22 0546 07/28/22 0857  WBC 6.9 4.5  HGB 10.1* 9.6*  HCT 31.5* 29.9*  PLT 138* 103*   BMET Recent Labs    07/27/22 0546 07/28/22 0857  NA 138 137  K 3.8 4.1  CL 104 102  CO2 26 26  GLUCOSE 139* 184*  BUN 8 8  CREATININE 0.69 0.83  CALCIUM 8.1* 7.9*   PT/INR No results for input(s): "LABPROT", "INR" in the last 72 hours.  Studies/Results: CT ABDOMEN PELVIS W CONTRAST  Result Date: 07/27/2022 CLINICAL DATA:  Left lower quadrant pain with diarrhea, history of lung carcinoma, initial encounter EXAM: CT ABDOMEN AND PELVIS WITH CONTRAST TECHNIQUE: Multidetector CT imaging of the abdomen and pelvis was performed using the standard protocol following bolus administration of intravenous contrast. RADIATION DOSE REDUCTION: This exam was performed according to the departmental dose-optimization program which includes automated exposure control, adjustment of the mA and/or kV according to patient size and/or use of iterative reconstruction technique. CONTRAST:  OMNIPAQUE IOHEXOL 300 MG/ML  SOLN COMPARISON:  07/15/2022 FINDINGS: Lower chest:  Right lung base is clear. Left lung base again demonstrates a medial soft tissue mass which measures 5.6 x 4.2 cm in greatest AP and transverse dimensions respectively. This is relatively stable from the recent exam. Small left effusion is noted slightly increased when compared with the prior exam. Hepatobiliary: Fatty infiltration of the liver is noted. Gallbladder is well distended with vicarious excretion of contrast material as well as a large lamellated gallstone. Area of focal fatty infiltration is noted within the liver. This is stable from the prior study. Pancreas: Hypodensity is again noted in the head of the pancreas measuring approximately 7 mm stable in appearance from the prior exam. It is somewhat less well visualized on the current exam. Spleen: Normal in size without focal abnormality. Adrenals/Urinary Tract: Adrenal glands are within normal limits. Kidneys demonstrate a normal enhancement pattern bilaterally. No renal calculi or obstructive changes are seen. The bladder is within normal limits. Stomach/Bowel: Scattered diverticular change of the colon is noted without evidence of diverticulitis. The appendix is not well visualized and may have been surgically removed. No inflammatory changes are seen. The small bowel and stomach are within normal limits. Vascular/Lymphatic: Aortic atherosclerosis. No enlarged abdominal or pelvic lymph nodes. Small periaortic and intra-aortocaval nodes are seen similar to that seen on prior exam. Reproductive: Status post hysterectomy. No adnexal masses. Other: No abdominal wall hernia or abnormality. No abdominopelvic ascites. Musculoskeletal: Degenerative changes of lumbar spine are noted. No acute abnormality is seen. IMPRESSION: Stable appearing left lower lobe mass lesion consistent with the known clinical history. Fatty infiltration of the  liver. Cholelithiasis without complicating factors. Stable cystic appearing lesion in the head of the pancreas. This can  be followed on routine oncology follow-up imaging. Diverticulosis without diverticulitis. Electronically Signed   By: Alcide Clever M.D.   On: 07/27/2022 21:19    Anti-infectives: Anti-infectives (From admission, onward)    Start     Dose/Rate Route Frequency Ordered Stop   07/27/22 1015  piperacillin-tazobactam (ZOSYN) IVPB 3.375 g        3.375 g 12.5 mL/hr over 240 Minutes Intravenous Every 8 hours 07/27/22 1610         Assessment/Plan: Patient with multiple issues and fever of unclear origin. Known large gallstone but Ct without any inflammation and [place] with no wall thickening. I am not sure if this is truly causing the fever or if it is from her immunocompromised state or from some other source. She has a rash on her extremities and back now.   HIDA to finally lay to rest if this gallbladder could be causing her to have fevers as there is no other obvious source at this time  NPO after midnight  No narcotics after midnight   Discussed with team.  On zosyn currently.    LOS: 1 day    Lucretia Roers 07/28/2022

## 2022-07-29 ENCOUNTER — Inpatient Hospital Stay (HOSPITAL_COMMUNITY): Payer: BC Managed Care – PPO

## 2022-07-29 ENCOUNTER — Encounter: Payer: Self-pay | Admitting: Internal Medicine

## 2022-07-29 DIAGNOSIS — K8000 Calculus of gallbladder with acute cholecystitis without obstruction: Principal | ICD-10-CM

## 2022-07-29 LAB — COMPREHENSIVE METABOLIC PANEL
ALT: 29 U/L (ref 0–44)
AST: 27 U/L (ref 15–41)
Albumin: 2.3 g/dL — ABNORMAL LOW (ref 3.5–5.0)
Alkaline Phosphatase: 45 U/L (ref 38–126)
Anion gap: 8 (ref 5–15)
BUN: 12 mg/dL (ref 8–23)
CO2: 25 mmol/L (ref 22–32)
Calcium: 8.1 mg/dL — ABNORMAL LOW (ref 8.9–10.3)
Chloride: 102 mmol/L (ref 98–111)
Creatinine, Ser: 0.74 mg/dL (ref 0.44–1.00)
GFR, Estimated: >60 mL/min (ref >60)
Glucose, Bld: 269 mg/dL — ABNORMAL HIGH (ref 70–99)
Potassium: 3.4 mmol/L — ABNORMAL LOW (ref 3.5–5.1)
Sodium: 135 mmol/L (ref 135–145)
Total Bilirubin: 1.1 mg/dL (ref 0.3–1.2)
Total Protein: 5.5 g/dL — ABNORMAL LOW (ref 6.5–8.1)

## 2022-07-29 LAB — CBC WITH DIFFERENTIAL/PLATELET
Abs Immature Granulocytes: 0 10*3/uL (ref 0.00–0.07)
Basophils Absolute: 0 10*3/uL (ref 0.0–0.1)
Basophils Relative: 0 %
Eosinophils Absolute: 0 10*3/uL (ref 0.0–0.5)
Eosinophils Relative: 0 %
HCT: 29.1 % — ABNORMAL LOW (ref 36.0–46.0)
Hemoglobin: 9.4 g/dL — ABNORMAL LOW (ref 12.0–15.0)
Lymphocytes Relative: 23 %
Lymphs Abs: 0.4 10*3/uL — ABNORMAL LOW (ref 0.7–4.0)
MCH: 31.9 pg (ref 26.0–34.0)
MCHC: 32.3 g/dL (ref 30.0–36.0)
MCV: 98.6 fL (ref 80.0–100.0)
Monocytes Absolute: 0.1 10*3/uL (ref 0.1–1.0)
Monocytes Relative: 5 %
Neutro Abs: 1.4 10*3/uL — ABNORMAL LOW (ref 1.7–7.7)
Neutrophils Relative %: 72 %
Platelets: 89 10*3/uL — ABNORMAL LOW (ref 150–400)
RBC: 2.95 MIL/uL — ABNORMAL LOW (ref 3.87–5.11)
RDW: 14.1 % (ref 11.5–15.5)
WBC: 1.9 10*3/uL — ABNORMAL LOW (ref 4.0–10.5)
nRBC: 0 % (ref 0.0–0.2)

## 2022-07-29 LAB — GLUCOSE, CAPILLARY
Glucose-Capillary: 232 mg/dL — ABNORMAL HIGH (ref 70–99)
Glucose-Capillary: 147 mg/dL — ABNORMAL HIGH (ref 70–99)
Glucose-Capillary: 274 mg/dL — ABNORMAL HIGH (ref 70–99)
Glucose-Capillary: 309 mg/dL — ABNORMAL HIGH (ref 70–99)

## 2022-07-29 LAB — MAGNESIUM: Magnesium: 1.4 mg/dL — ABNORMAL LOW (ref 1.7–2.4)

## 2022-07-29 MED ORDER — TECHNETIUM TC 99M MEBROFENIN IV KIT
5.2000 | PACK | Freq: Once | INTRAVENOUS | Status: AC | PRN
  Administered 2022-07-29: 5.2 via INTRAVENOUS

## 2022-07-29 MED ORDER — TRIAMCINOLONE ACETONIDE 0.5 % EX CREA
TOPICAL_CREAM | Freq: Three times a day (TID) | CUTANEOUS | Status: DC
  Filled 2022-07-29: qty 15

## 2022-07-29 MED ORDER — MORPHINE SULFATE (PF) 4 MG/ML IV SOLN
3.0000 mg | Freq: Once | INTRAVENOUS | Status: AC
  Administered 2022-07-29: 3 mg via INTRAVENOUS
  Filled 2022-07-29: qty 1

## 2022-07-29 MED ORDER — MAGNESIUM SULFATE 4 GM/100ML IV SOLN
4.0000 g | Freq: Once | INTRAVENOUS | Status: AC
  Administered 2022-07-29: 4 g via INTRAVENOUS
  Filled 2022-07-29: qty 100

## 2022-07-29 MED ORDER — INSULIN GLARGINE-YFGN 100 UNIT/ML ~~LOC~~ SOLN
10.0000 [IU] | Freq: Every day | SUBCUTANEOUS | Status: DC
  Administered 2022-07-30: 10 [IU] via SUBCUTANEOUS
  Filled 2022-07-29 (×2): qty 0.1

## 2022-07-29 MED ORDER — INSULIN ASPART 100 UNIT/ML IJ SOLN
4.0000 [IU] | Freq: Three times a day (TID) | INTRAMUSCULAR | Status: DC
  Administered 2022-07-29 – 2022-07-30 (×2): 4 [IU] via SUBCUTANEOUS

## 2022-07-29 MED ORDER — ALPRAZOLAM 0.25 MG PO TABS
0.2500 mg | ORAL_TABLET | Freq: Once | ORAL | Status: AC | PRN
  Administered 2022-07-29: 0.25 mg via ORAL
  Filled 2022-07-29: qty 1

## 2022-07-29 MED ORDER — FENTANYL CITRATE PF 50 MCG/ML IJ SOSY: 25.0000 ug | PREFILLED_SYRINGE | INTRAMUSCULAR | Status: DC | PRN

## 2022-07-29 MED ORDER — PREDNISONE 20 MG PO TABS
40.0000 mg | ORAL_TABLET | Freq: Once | ORAL | Status: AC
  Administered 2022-07-29: 40 mg via ORAL
  Filled 2022-07-29: qty 2

## 2022-07-29 NOTE — Progress Notes (Signed)
PROGRESS NOTE   Carolyn Hooper  ZOX:[id number] DOB: December 24, 1960 DOA: 07/26/2022 PCP: [name], FNP   Chief Complaint  Patient presents with   Fever   Cough   Level of care: Med-Surg  Brief Admission History:  62 y.o. 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 -Patient reports she felt febrile this morning, so she checked her temperature was 101, she does report some cough, congestion, white phlegm, she denies dysuria, polyuria, chest pain, shortness of breath, no hemoptysis, no leg pain or worsening edema, she does report rash on itching, does report some tenderness at the Port-A-Cath site, she reports she was stuck in it 7 times in ED last week, but site looks clean on physical exam. -In ED she was afebrile, leukocytosis, chest x-ray with no acute findings, UA is pending, upon ED physician discussion with Dr. Bertis Ruddy she from oncology, they recommended observation for 24 hours, so [facility] hospitalist consulted to admit.   Assessment and Plan:  Acute cholecystitis  - [place] positive for gallbladder thickening and sludge  - HIDA scan positive  - IV ampicillin/sulbactam ordered per pharm D - general surgery consulted, surgery not needed at this time as patient has no abd pain, eating well - recommendations are to complete 10-14 days of antibiotics given immunocompromised state and if surgery ultimately needed she would be best served in [place] and has seen [place] [facility] in the past.    Fever and chills in the immunocompromised patient  - blood cultures no growth to date  - repeat BC x 2 for fever>101 - IV antibiotics  started 5/11  - repeat CBCd in AM  - if remains afebrile, anticipate DC home 5/14   Secondary diabetes mellitus, type 2 with hyperglycemia, uncontrolled - uncontrolled as evidenced by A1c of 8.2%  - holding home metformin while in hospital  - continue SSI coverage and frequent CBG monitoring   CBG (last 3)  Recent Labs    07/28/22 1656 07/28/22 2138 07/29/22 0744  GLUCAP 178* 268* 232*   Stage IV lung cancer with brain metastases - Pt is being actively treated now by Dr. Arbutus Ped on chemotherapy  - Pt is receiving keppra BID for seizure prevention due to brain mets - Pt recently completed a steroid taper.   Hyponatremia  - from dehydration  - treated with IV fluids and resolved now  Hypokalemia  - this has been repleted   Hypomagnesemia - IV replacement given   Anemia in neoplastic disease  -MCV elevated  - Hg down with hydration (hemodilution)  - stable Hg around 9.4    Thrombocytopenia  - likely from heparin - DC heparin - follow   Rash - cause unknown possibly viral reaction vs drug reaction - pt says it started after chemotherapy - benadryl ordered, prednisone 40 mg x 1 dose - topical hydrocortisone creme ordered   DVT prophylaxis: SCDs  Code Status: Full  Family Communication:     Consultants:  surgery  Procedures:   Antimicrobials:  Zosyn IV 5/11>>   Subjective: No chills or fever but pruritic rash persists.  It is improved in legs but more red on arms.  Using steroid creme and benadryl.  Objective: Vitals:   07/28/22 2104 07/28/22 2141 07/29/22 0322 07/29/22 1230  BP: 125/70 125/74 (!) 101/48   Pulse: 85 78 71 72  Resp:  20    Temp: 97.6 F (36.4 C) 98.1 F (36.7 C) 97.9 F (36.6 C) 98 F (36.7 C)  TempSrc: Oral Oral Oral   SpO2: 96% 96% 95% 95%    Intake/Output Summary (Last 24 hours) at 07/29/2022 1342 Last data filed at 07/29/2022 0900 Gross per 24 hour  Intake 0 ml  Output --  Net 0 ml   Examination:  General exam:  Appears calm and comfortable  Respiratory system: Clear to auscultation. Respiratory effort normal. Cardiovascular system: normal S1 & S2 heard. No JVD, murmurs, rubs, gallops or clicks. No pedal edema. Gastrointestinal system: Abdomen is soft and tender in RUQ. No organomegaly or masses felt. Normal bowel sounds heard. Central nervous system: Alert and oriented. No focal neurological deficits. Extremities: Symmetric 5 x 5 power. Skin: diffuse macular rash seen on trunk and extremities. No lesions or ulcers. Psychiatry: Judgement and insight appear normal. Mood & affect appropriate.   Data Reviewed: I have personally reviewed following labs and imaging studies  CBC: Recent Labs  Lab 07/22/22 1403 07/26/22 1300 07/27/22 0546 07/28/22 0857 07/29/22 0448  WBC 14.6* 7.9 6.9 4.5 1.9*  NEUTROABS 9.0* 5.6  --  3.2 1.4*  HGB 14.2 11.0* 10.1* 9.6* 9.4*  HCT 43.3 34.4* 31.5* 29.9* 29.1*  MCV 100.7* 99.7 100.6* 101.4* 98.6  PLT 178 154 138* 103* 89*    Basic Metabolic Panel: Recent Labs  Lab 07/22/22 1403 07/26/22 1300 07/27/22 0546 07/28/22 0857 07/29/22 0448  NA 137 134* 138 137 135  K 3.8 3.3* 3.8 4.1 3.4*  CL 101 98 104 102 102  CO2 26 24 26 26 25   GLUCOSE 191* 178* 139* 184* 269*  BUN 12 7* 8 8 12   CREATININE 1.12* 0.78 0.69 0.83 0.74  CALCIUM 9.1 8.3* 8.1* 7.9* 8.1*  MG  --   --   --   --  1.4*    CBG: Recent Labs  Lab 07/28/22 0749 07/28/22 1129 07/28/22 1656 07/28/22 2138 07/29/22 0744  GLUCAP 182* 148* 178* 268* 232*    Recent Results (from the past 240 hour(s))  Resp panel by RT-PCR (RSV, Flu A&B, Covid) Anterior Nasal Swab     Status: None   Collection Time: 07/26/22  2:19 PM   Specimen: Anterior Nasal Swab  Result Value Ref Range Status   SARS Coronavirus 2 by RT PCR NEGATIVE NEGATIVE Final    Comment: (NOTE) SARS-CoV-2 target nucleic acids are NOT DETECTED.  The SARS-CoV-2 RNA is generally detectable in upper respiratory specimens during the acute  phase of infection. The lowest concentration of SARS-CoV-2 viral copies this assay can detect is 138 copies/mL. A negative result does not preclude SARS-Cov-2 infection and should not be used as the sole basis for treatment or other patient management decisions. A negative result may occur with  improper specimen collection/handling, submission of specimen other than nasopharyngeal swab, presence of viral mutation(s) within the areas targeted by this assay, and inadequate number of viral copies(<138 copies/mL). A negative result must be combined with clinical observations, patient history, and epidemiological information. The expected result is Negative.  Fact Sheet for Patients:  BloggerCourse.com  Fact Sheet for Healthcare Providers:  SeriousBroker.it  This test is no t yet approved or cleared by the [place] FDA and  has been authorized for detection and/or diagnosis of SARS-CoV-2 by FDA  under an Emergency Use Authorization (EUA). This EUA will remain  in effect (meaning this test can be used) for the duration of the COVID-19 declaration under Section 564(b)(1) of the Act, 21 U.S.C.section 360bbb-3(b)(1), unless the authorization is terminated  or revoked sooner.       Influenza A by PCR NEGATIVE NEGATIVE Final   Influenza B by PCR NEGATIVE NEGATIVE Final    Comment: (NOTE) The Xpert Xpress SARS-CoV-2/FLU/RSV plus assay is intended as an aid in the diagnosis of influenza from Nasopharyngeal swab specimens and should not be used as a sole basis for treatment. Nasal washings and aspirates are unacceptable for Xpert Xpress SARS-CoV-2/FLU/RSV testing.  Fact Sheet for Patients: BloggerCourse.com  Fact Sheet for Healthcare Providers: SeriousBroker.it  This test is not yet approved or cleared by the [place] FDA and has been authorized for detection and/or diagnosis of  SARS-CoV-2 by FDA under an Emergency Use Authorization (EUA). This EUA will remain in effect (meaning this test can be used) for the duration of the COVID-19 declaration under Section 564(b)(1) of the Act, 21 U.S.C. section 360bbb-3(b)(1), unless the authorization is terminated or revoked.     Resp Syncytial Virus by PCR NEGATIVE NEGATIVE Final    Comment: (NOTE) Fact Sheet for Patients: BloggerCourse.com  Fact Sheet for Healthcare Providers: SeriousBroker.it  This test is not yet approved or cleared by the [place] FDA and has been authorized for detection and/or diagnosis of SARS-CoV-2 by FDA under an Emergency Use Authorization (EUA). This EUA will remain in effect (meaning this test can be used) for the duration of the COVID-19 declaration under Section 564(b)(1) of the Act, 21 U.S.C. section 360bbb-3(b)(1), unless the authorization is terminated or revoked.  Performed at [facility], [address] [place], [place] [address]   Respiratory (~20 pathogens) panel by PCR     Status: None   Collection Time: 07/26/22  3:25 PM   Specimen: Nasopharyngeal Swab; Respiratory  Result Value Ref Range Status   Adenovirus NOT DETECTED NOT DETECTED Final   Coronavirus 229E NOT DETECTED NOT DETECTED Final    Comment: (NOTE) The Coronavirus on the Respiratory Panel, DOES NOT test for the novel  Coronavirus (2019 nCoV)    Coronavirus HKU1 NOT DETECTED NOT DETECTED Final   Coronavirus NL63 NOT DETECTED NOT DETECTED Final   Coronavirus OC43 NOT DETECTED NOT DETECTED Final   Metapneumovirus NOT DETECTED NOT DETECTED Final   Rhinovirus / Enterovirus NOT DETECTED NOT DETECTED Final   Influenza A NOT DETECTED NOT DETECTED Final   Influenza B NOT DETECTED NOT DETECTED Final   Parainfluenza Virus 1 NOT DETECTED NOT DETECTED Final   Parainfluenza Virus 2 NOT DETECTED NOT DETECTED Final   Parainfluenza Virus 3 NOT DETECTED NOT DETECTED Final    Parainfluenza Virus 4 NOT DETECTED NOT DETECTED Final   Respiratory Syncytial Virus NOT DETECTED NOT DETECTED Final   Bordetella pertussis NOT DETECTED NOT DETECTED Final   Bordetella Parapertussis NOT DETECTED NOT DETECTED Final   Chlamydophila pneumoniae NOT DETECTED NOT DETECTED Final   Mycoplasma pneumoniae NOT DETECTED NOT DETECTED Final    Comment: Performed at [facility], [address], [place], [place] [address]  Culture, blood (routine x 2)     Status: None (Preliminary result)   Collection Time: 07/26/22  3:30 PM   Specimen: BLOOD  Result Value Ref Range Status   Specimen Description BLOOD BLOOD LEFT HAND  Final   Special Requests   Final    BOTTLES DRAWN AEROBIC AND  ANAEROBIC Blood Culture adequate volume   Culture   Final    NO GROWTH 3 DAYS Performed at [facility], [address], [place], [place] [address]    Report Status PENDING  Incomplete  Culture, blood (routine x 2)     Status: None (Preliminary result)   Collection Time: 07/26/22  3:31 PM   Specimen: BLOOD  Result Value Ref Range Status   Specimen Description BLOOD BLOOD LEFT ARM  Final   Special Requests   Final    BOTTLES DRAWN AEROBIC AND ANAEROBIC Blood Culture adequate volume   Culture   Final    NO GROWTH 3 DAYS Performed at [facility], [address] [place], [place] [address]    Report Status PENDING  Incomplete     Radiology Studies: NM Hepatobiliary Liver Func  Result Date: 07/29/2022 CLINICAL DATA:  Evaluate for cholecystitis. EXAM: NUCLEAR MEDICINE HEPATOBILIARY IMAGING TECHNIQUE: Sequential images of the abdomen were obtained out to 60 minutes following intravenous administration of radiopharmaceutical. RADIOPHARMACEUTICALS:  5.2 mCi Tc-11m  Choletec IV COMPARISON:  CT AP 07/27/2022 FINDINGS: Prompt uptake and biliary excretion of activity by the liver is seen. Biliary activity passes into small bowel, consistent with patent common bile duct. After 1 hour of imaging there was no  gallbladder activity visualized. The patient subsequently received 3 mg of morphine ET and additional imaging was carried out for 30 minutes without visualization of the gallbladder. IMPRESSION: 1. Nonvisualization of the gallbladder consistent with cystic duct obstruction and acute cholecystitis. 2. Patent common bile duct with normal biliary to bowel transit. Electronically Signed   By: Signa Kell M.D.   On: 07/29/2022 13:30   CT ABDOMEN PELVIS W CONTRAST  Result Date: 07/27/2022 CLINICAL DATA:  Left lower quadrant pain with diarrhea, history of lung carcinoma, initial encounter EXAM: CT ABDOMEN AND PELVIS WITH CONTRAST TECHNIQUE: Multidetector CT imaging of the abdomen and pelvis was performed using the standard protocol following bolus administration of intravenous contrast. RADIATION DOSE REDUCTION: This exam was performed according to the departmental dose-optimization program which includes automated exposure control, adjustment of the mA and/or kV according to patient size and/or use of iterative reconstruction technique. CONTRAST:  OMNIPAQUE IOHEXOL 300 MG/ML  SOLN COMPARISON:  07/15/2022 FINDINGS: Lower chest: Right lung base is clear. Left lung base again demonstrates a medial soft tissue mass which measures 5.6 x 4.2 cm in greatest AP and transverse dimensions respectively. This is relatively stable from the recent exam. Small left effusion is noted slightly increased when compared with the prior exam. Hepatobiliary: Fatty infiltration of the liver is noted. Gallbladder is well distended with vicarious excretion of contrast material as well as a large lamellated gallstone. Area of focal fatty infiltration is noted within the liver. This is stable from the prior study. Pancreas: Hypodensity is again noted in the head of the pancreas measuring approximately 7 mm stable in appearance from the prior exam. It is somewhat less well visualized on the current exam. Spleen: Normal in size without  focal abnormality. Adrenals/Urinary Tract: Adrenal glands are within normal limits. Kidneys demonstrate a normal enhancement pattern bilaterally. No renal calculi or obstructive changes are seen. The bladder is within normal limits. Stomach/Bowel: Scattered diverticular change of the colon is noted without evidence of diverticulitis. The appendix is not well visualized and may have been surgically removed. No inflammatory changes are seen. The small bowel and stomach are within normal limits. Vascular/Lymphatic: Aortic atherosclerosis. No enlarged abdominal or pelvic lymph nodes. Small periaortic and intra-aortocaval  nodes are seen similar to that seen on prior exam. Reproductive: Status post hysterectomy. No adnexal masses. Other: No abdominal wall hernia or abnormality. No abdominopelvic ascites. Musculoskeletal: Degenerative changes of lumbar spine are noted. No acute abnormality is seen. IMPRESSION: Stable appearing left lower lobe mass lesion consistent with the known clinical history. Fatty infiltration of the liver. Cholelithiasis without complicating factors. Stable cystic appearing lesion in the head of the pancreas. This can be followed on routine oncology follow-up imaging. Diverticulosis without diverticulitis. Electronically Signed   By: Alcide Clever M.D.   On: 07/27/2022 21:19    Scheduled Meds:  Chlorhexidine Gluconate Cloth  6 each Topical Daily   clonazePAM  0.5 mg Oral QHS   dextromethorphan-guaiFENesin  1 tablet Oral BID   folic acid  1 mg Oral Daily   heparin injection (subcutaneous)  5,000 Units Subcutaneous Q8H   insulin aspart  0-15 Units Subcutaneous TID WC   insulin aspart  0-5 Units Subcutaneous QHS   insulin aspart  4 Units Subcutaneous TID WC   [START ON 07/30/2022] insulin glargine-yfgn  10 Units Subcutaneous Daily   levETIRAcetam  1,000 mg Oral BID   predniSONE  40 mg Oral Once   triamcinolone cream   Topical TID   Continuous Infusions:  sodium chloride 35 mL/hr at  07/28/22 1655   magnesium sulfate bolus IVPB     piperacillin-tazobactam (ZOSYN)  IV 3.375 g (07/29/22 0521)     LOS: 2 days   Time spent: 35 mins  Jonah Nestle Laural Benes, MD How to contact the [facility] Attending or Consulting provider 7A - 7P or covering provider during after hours 7P -7A, for this patient?  Check the care team in [facility] and look for a) attending/consulting [facility] provider listed and b) the [facility] team listed Log into [email or web address] and use [facility] universal password to access. If you do not have the password, please contact the hospital operator. Locate the [facility] provider you are looking for under [facility] and page to a number that you can be directly reached. If you still have difficulty reaching the provider, please page the [facility] (Director on Call) for the Hospitalists listed on amion for assistance.  07/29/2022, 1:42 PM

## 2022-07-29 NOTE — Progress Notes (Signed)
[facility]  Patient seen in Nuclear Medicine. HIDA without visualized gallbladder at this time. No surprising given that large stone. Will await the final read.  BP (!) 101/48 (BP Location: Left Arm)   Pulse 72   Temp 98 F (36.7 C)   Resp 20   SpO2 95%  Patient in nuclear medicine scan No acute distress   Would continue to treat with antibiotics. If HIDA positive for acute cholecystitis would just treat with antibiotics given her chemotherapy and metastatic cancer. Could consider surgery in the future if worsening pain or issues, but at this time she is not hurting and is tolerating a diet.  Diet after HIDA completed   Algis Greenhouse, MD [facility] [address] [place], [place] [address] [phone] (office)

## 2022-07-29 NOTE — Progress Notes (Signed)
[facility]  Final read is acute cholecystitis. Treat with antibiotics. Diet as tolerated. Would think we should treat her with antibiotics for 10-14 days given her immunocompromised state. Could transition to Augmentin for dc home and she has already seen [facility] during her [facility] admission, and I think if she does end up and need cholecystectomy she would be better served in [place] given her risk with chemotherapy and metastatic lung cancer to the brain.   Algis Greenhouse, MD [facility] [address] [place], [place] [address] [phone] (office)

## 2022-07-30 ENCOUNTER — Other Ambulatory Visit (HOSPITAL_COMMUNITY): Payer: Self-pay

## 2022-07-30 LAB — GLUCOSE, CAPILLARY
Glucose-Capillary: 291 mg/dL — ABNORMAL HIGH (ref 70–99)
Glucose-Capillary: 195 mg/dL — ABNORMAL HIGH (ref 70–99)

## 2022-07-30 MED ORDER — SACCHAROMYCES BOULARDII 250 MG PO CAPS
250.0000 mg | ORAL_CAPSULE | Freq: Two times a day (BID) | ORAL | 0 refills | Status: AC
  Filled 2022-07-30 – 2022-08-05 (×2): qty 30, 15d supply, fill #0

## 2022-07-30 MED ORDER — HEPARIN SOD (PORK) LOCK FLUSH 100 UNIT/ML IV SOLN
500.0000 [IU] | Freq: Once | INTRAVENOUS | Status: AC
  Administered 2022-07-30: 500 [IU] via INTRAVENOUS
  Filled 2022-07-30: qty 5

## 2022-07-30 MED ORDER — AMOXICILLIN-POT CLAVULANATE 875-125 MG PO TABS
1.0000 | ORAL_TABLET | Freq: Two times a day (BID) | ORAL | 0 refills | Status: AC
  Filled 2022-07-30: qty 22, 11d supply, fill #0

## 2022-07-30 MED ORDER — TRIAMCINOLONE ACETONIDE 0.5 % EX OINT
1.0000 | TOPICAL_OINTMENT | Freq: Two times a day (BID) | CUTANEOUS | 0 refills | Status: DC
  Filled 2022-07-30: qty 30, 15d supply, fill #0

## 2022-07-30 MED ORDER — CLONAZEPAM 0.5 MG PO TABS
0.5000 mg | ORAL_TABLET | Freq: Every day | ORAL | Status: DC
Start: 2022-07-30 — End: 2022-08-22

## 2022-07-30 MED ORDER — FLUCONAZOLE 150 MG PO TABS
150.0000 mg | ORAL_TABLET | ORAL | 0 refills | Status: DC | PRN
  Filled 2022-07-30: qty 3, 9d supply, fill #0

## 2022-07-30 MED ORDER — LANTUS SOLOSTAR 100 UNIT/ML ~~LOC~~ SOPN: 8.0000 [IU] | PEN_INJECTOR | Freq: Every day | SUBCUTANEOUS | Status: DC

## 2022-07-30 NOTE — Discharge Summary (Signed)
Physician Discharge Summary  Carolyn Hooper:[id number] DOB: Feb 02, 1961 DOA: 07/26/2022  PCP: [name], FNP Oncologist: Dr. Arbutus Ped   Admit date: 07/26/2022 Discharge date: 07/30/2022  Admitted From:  Home  Disposition: Home   Recommendations for Outpatient Follow-up:  Follow up with PCP in 2 weeks Follow up with oncologist in 1 week Follow up with [facility] in 1 month Follow up with dermatology in 1 week if rash persists  Please obtain BMP/CBC in 1-2 weeks Please follow up on the following pending results: final culture data   Discharge Condition: STABLE   CODE STATUS: FULL  DIET: carb modified    Brief Hospitalization Summary: Please see all hospital notes, images, labs for full details of the hospitalization. ADMISSION PROVIDER HPI:  62 y.o. 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 -Patient reports she felt febrile this morning, so she checked her temperature was 101, she does report some cough, congestion, white phlegm, she denies dysuria, polyuria, chest pain, shortness of breath, no hemoptysis, no leg pain or worsening edema, she does report rash on itching, does report some tenderness at the Port-A-Cath site, she reports she was stuck in it 7 times in ED last week, but site looks clean on physical exam. -In ED she was afebrile, leukocytosis, chest x-ray with no acute findings, UA is pending, upon ED physician discussion with Dr. Bertis Ruddy she from oncology, they recommended observation for 24 hours, so [facility] hospitalist consulted to admit.  HOSPITAL COURSE BY PROBLEM LIST   Acute cholecystitis   - [place] positive for gallbladder thickening and sludge  - HIDA scan positive for acute cholecystitis  - IV ampicillin/sulbactam ordered per pharm D in hospital  - general surgery consulted, surgery not needed at this time as patient has no abd pain, eating well - recommendations are to complete 10-14 days of antibiotics given immunocompromised state and if surgery ultimately needed she would be best served in [place] and has seen [facility] in the past.   - pt needs to complete 11 days of oral augmentin - follow up with [facility] outpatient in 1 month  - probiotics and fluconazole for possible yeast infection    Fever and chills in the immunocompromised patient - RESOLVED  - blood cultures no growth to date  - IV antibiotics started 5/11, transitioned to oral augmentin at discharge  - DC home 5/14    Secondary diabetes mellitus, type 2 with hyperglycemia, uncontrolled - uncontrolled as evidenced by A1c of 8.2%  - holding home metformin while in hospital  - continue SSI coverage and frequent CBG monitoring - some steroid-induced hyperglycemia from prednisone - resume home treatment at discharge with outpatient follow up      Stage IV lung cancer with brain metastases - Pt is being actively treated now by Dr. Arbutus Ped on chemotherapy  - Pt is receiving keppra BID for seizure prevention due to brain mets - Pt recently completed a steroid taper.  - follow up with Dr. Arbutus Ped    Hyponatremia - RESOLVED  - from dehydration  - treated with IV fluids and resolved now   Hypokalemia  - this has been repleted    Hypomagnesemia - IV replacement  given  - this has been repleted    Anemia in neoplastic disease  -MCV elevated  - Hg down with hydration (hemodilution)  - stable Hg around 9.4     Thrombocytopenia  - likely from heparin - DC heparin - follow up CBC outpatient in 1 week recommended    Macular Rash - cause unknown possibly drug reaction - pt says it  started after chemotherapy - benadryl ordered, prednisone 40 mg x 2 doses - topical triamcinolone ointment ordered  - follow up with dermatology outpatient     Discharge Diagnoses:  Principal Problem:   Cholecystitis, acute Active Problems:   Hyperlipidemia associated with type 2 diabetes mellitus (HCC)   Morbid obesity (HCC)   Brain mass   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)   Cholelithiasis   Fever   Fever, unknown origin   Lower abdominal pain   Diarrhea   Discharge Instructions: Discharge Instructions     Ambulatory referral to Dermatology   Complete by: As directed    Ambulatory referral to General Surgery   Complete by: As directed    Ambulatory referral to Hematology / Oncology   Complete by: As directed    Hospital follow up      Allergies as of 07/30/2022       Reactions   Adhesive [tape] Dermatitis   Statins Other (See Comments)   Myalgia, itching        Medication List     STOP taking these medications    dexamethasone 1 MG tablet Commonly known as: DECADRON   pantoprazole 40 MG tablet Commonly known as: PROTONIX       TAKE these medications    albuterol 108 (90 Base) MCG/ACT inhaler Commonly known as: VENTOLIN HFA Inhale 2 puffs into the lungs every 6 (six) hours as needed.   amoxicillin-clavulanate 875-125 MG tablet Commonly known as: AUGMENTIN Take 1 tablet by mouth 2 (two) times daily for 11 days.   clonazePAM 0.5 MG tablet Commonly known as: KLONOPIN Take 1 tablet (0.5 mg total) by mouth at bedtime.   fluconazole 150 MG tablet Commonly known as: DIFLUCAN Take 1 tablet (150 mg total) by mouth every three (3) days as needed (yeast infection). What changed: reasons to take this   folic acid 1 MG tablet Commonly known as: FOLVITE Take 1 tablet (1 mg total) by mouth daily.   furosemide 20 MG tablet Commonly known as: LASIX Take 1 tablet (20 mg total) by mouth daily as needed.   HYDROcodone-acetaminophen  5-325 MG tablet Commonly known as: Norco Take 1 - 2 tablets by mouth every 6 (six) hours as needed for moderate pain.   Lantus SoloStar 100 UNIT/ML Solostar Pen Generic drug: insulin glargine Inject 8 Units into the skin daily. If BS over 200 increase by 2 units   levETIRAcetam 1000 MG tablet Commonly known as: KEPPRA Take 1 tablet (1,000 mg total) by mouth 2 (two) times daily.   lidocaine 5 % ointment Commonly known as: XYLOCAINE Apply topically as needed.   lidocaine-prilocaine cream Commonly known as: EMLA Apply to port prior to access   magic mouthwash (nystatin, diphenhydrAMINE, alum & mag hydroxide) suspension mixture Take 5 mLs by mouth 3 (three) times daily as needed for mouth pain.   metFORMIN 1000 MG tablet Commonly known as: GLUCOPHAGE Take 1 tablet (1,000 mg total) by mouth 2 (two) times daily.   ondansetron 4 MG tablet Commonly known as: ZOFRAN Take 1 tablet (4 mg total) by mouth every  6 (six) hours as needed for nausea. What changed: how much to take   prochlorperazine 10 MG tablet Commonly known as: COMPAZINE Take 1 tablet (10 mg total) by mouth every 6 (six) hours as needed.   saccharomyces boulardii 250 MG capsule Commonly known as: Florastor Take 1 capsule (250 mg total) by mouth 2 (two) times daily for 15 days.   TechLite Pen Needles 32G X 4 MM Misc Generic drug: Insulin Pen Needle Use to inject insulin daily as directed.   triamcinolone ointment 0.5 % Commonly known as: KENALOG Apply 1 Application topically 2 (two) times daily.        Follow-up Information     [name], FNP. Schedule an appointment as soon as possible for a visit in 2 week(s).   Specialty: Family Medicine Why: Hospital Follow Up Contact information: [address] [place] [address] [phone]         Surgery, [place]. Schedule an appointment as soon as possible for a visit in 1 month(s).   Specialty: General Surgery Why: Hospital Follow  Up Contact information: [address] [place] [address] [phone]         [name], MD. Schedule an appointment as soon as possible for a visit in 1 week(s).   Specialty: Oncology Why: Hospital Follow Up Contact information: [address] [place] [address] [phone]                Allergies  Allergen Reactions   Adhesive [Tape] Dermatitis   Statins Other (See Comments)    Myalgia, itching   Allergies as of 07/30/2022       Reactions   Adhesive [tape] Dermatitis   Statins Other (See Comments)   Myalgia, itching        Medication List     STOP taking these medications    dexamethasone 1 MG tablet Commonly known as: DECADRON   pantoprazole 40 MG tablet Commonly known as: PROTONIX       TAKE these medications    albuterol 108 (90 Base) MCG/ACT inhaler Commonly known as: VENTOLIN HFA Inhale 2 puffs into the lungs every 6 (six) hours as needed.   amoxicillin-clavulanate 875-125 MG tablet Commonly known as: AUGMENTIN Take 1 tablet by mouth 2 (two) times daily for 11 days.   clonazePAM 0.5 MG tablet Commonly known as: KLONOPIN Take 1 tablet (0.5 mg total) by mouth at bedtime.   fluconazole 150 MG tablet Commonly known as: DIFLUCAN Take 1 tablet (150 mg total) by mouth every three (3) days as needed (yeast infection). What changed: reasons to take this   folic acid 1 MG tablet Commonly known as: FOLVITE Take 1 tablet (1 mg total) by mouth daily.   furosemide 20 MG tablet Commonly known as: LASIX Take 1 tablet (20 mg total) by mouth daily as needed.   HYDROcodone-acetaminophen 5-325 MG tablet Commonly known as: Norco Take 1 - 2 tablets by mouth every 6 (six) hours as needed for moderate pain.   Lantus SoloStar 100 UNIT/ML Solostar Pen Generic drug: insulin glargine Inject 8 Units into the skin daily. If BS over 200 increase by 2 units   levETIRAcetam 1000 MG tablet Commonly known as:  KEPPRA Take 1 tablet (1,000 mg total) by mouth 2 (two) times daily.   lidocaine 5 % ointment Commonly known as: XYLOCAINE Apply topically as needed.   lidocaine-prilocaine cream Commonly known as: EMLA Apply to port prior to access   magic mouthwash (nystatin,  diphenhydrAMINE, alum & mag hydroxide) suspension mixture Take 5 mLs by mouth 3 (three) times daily as needed for mouth pain.   metFORMIN 1000 MG tablet Commonly known as: GLUCOPHAGE Take 1 tablet (1,000 mg total) by mouth 2 (two) times daily.   ondansetron 4 MG tablet Commonly known as: ZOFRAN Take 1 tablet (4 mg total) by mouth every 6 (six) hours as needed for nausea. What changed: how much to take   prochlorperazine 10 MG tablet Commonly known as: COMPAZINE Take 1 tablet (10 mg total) by mouth every 6 (six) hours as needed.   saccharomyces boulardii 250 MG capsule Commonly known as: Florastor Take 1 capsule (250 mg total) by mouth 2 (two) times daily for 15 days.   TechLite Pen Needles 32G X 4 MM Misc Generic drug: Insulin Pen Needle Use to inject insulin daily as directed.   triamcinolone ointment 0.5 % Commonly known as: KENALOG Apply 1 Application topically 2 (two) times daily.        Procedures/Studies: NM Hepatobiliary Liver Func  Result Date: 07/29/2022 CLINICAL DATA:  Evaluate for cholecystitis. EXAM: NUCLEAR MEDICINE HEPATOBILIARY IMAGING TECHNIQUE: Sequential images of the abdomen were obtained out to 60 minutes following intravenous administration of radiopharmaceutical. RADIOPHARMACEUTICALS:  5.2 mCi Tc-56m  Choletec IV COMPARISON:  CT AP 07/27/2022 FINDINGS: Prompt uptake and biliary excretion of activity by the liver is seen. Biliary activity passes into small bowel, consistent with patent common bile duct. After 1 hour of imaging there was no gallbladder activity visualized. The patient subsequently received 3 mg of morphine ET and additional imaging was carried out for 30 minutes without  visualization of the gallbladder. IMPRESSION: 1. Nonvisualization of the gallbladder consistent with cystic duct obstruction and acute cholecystitis. 2. Patent common bile duct with normal biliary to bowel transit. Electronically Signed   By: Signa Kell M.D.   On: 07/29/2022 13:30   CT ABDOMEN PELVIS W CONTRAST  Result Date: 07/27/2022 CLINICAL DATA:  Left lower quadrant pain with diarrhea, history of lung carcinoma, initial encounter EXAM: CT ABDOMEN AND PELVIS WITH CONTRAST TECHNIQUE: Multidetector CT imaging of the abdomen and pelvis was performed using the standard protocol following bolus administration of intravenous contrast. RADIATION DOSE REDUCTION: This exam was performed according to the departmental dose-optimization program which includes automated exposure control, adjustment of the mA and/or kV according to patient size and/or use of iterative reconstruction technique. CONTRAST:  OMNIPAQUE IOHEXOL 300 MG/ML  SOLN COMPARISON:  07/15/2022 FINDINGS: Lower chest: Right lung base is clear. Left lung base again demonstrates a medial soft tissue mass which measures 5.6 x 4.2 cm in greatest AP and transverse dimensions respectively. This is relatively stable from the recent exam. Small left effusion is noted slightly increased when compared with the prior exam. Hepatobiliary: Fatty infiltration of the liver is noted. Gallbladder is well distended with vicarious excretion of contrast material as well as a large lamellated gallstone. Area of focal fatty infiltration is noted within the liver. This is stable from the prior study. Pancreas: Hypodensity is again noted in the head of the pancreas measuring approximately 7 mm stable in appearance from the prior exam. It is somewhat less well visualized on the current exam. Spleen: Normal in size without focal abnormality. Adrenals/Urinary Tract: Adrenal glands are within normal limits. Kidneys demonstrate a normal enhancement pattern bilaterally. No  renal calculi or obstructive changes are seen. The bladder is within normal limits. Stomach/Bowel: Scattered diverticular change of the colon is noted without evidence of diverticulitis. The appendix is  not well visualized and may have been surgically removed. No inflammatory changes are seen. The small bowel and stomach are within normal limits. Vascular/Lymphatic: Aortic atherosclerosis. No enlarged abdominal or pelvic lymph nodes. Small periaortic and intra-aortocaval nodes are seen similar to that seen on prior exam. Reproductive: Status post hysterectomy. No adnexal masses. Other: No abdominal wall hernia or abnormality. No abdominopelvic ascites. Musculoskeletal: Degenerative changes of lumbar spine are noted. No acute abnormality is seen. IMPRESSION: Stable appearing left lower lobe mass lesion consistent with the known clinical history. Fatty infiltration of the liver. Cholelithiasis without complicating factors. Stable cystic appearing lesion in the head of the pancreas. This can be followed on routine oncology follow-up imaging. Diverticulosis without diverticulitis. Electronically Signed   By: Alcide Clever M.D.   On: 07/27/2022 21:19   US Abdomen Limited RUQ (LIVER/GB)  Result Date: 07/26/2022 CLINICAL DATA:  Fever EXAM: ULTRASOUND ABDOMEN LIMITED RIGHT UPPER QUADRANT COMPARISON:  Ultrasound 07/16/2022, CT 07/15/2022 FINDINGS: Gallbladder: Distended gallbladder. Large shadowing stone within the proximal lumen measure up to 4.4 cm. Normal wall thickness. Positive sonographic Murphy. Sludge filled remaining gallbladder Common bile duct: Diameter: 5.6 mm Liver: Poorly visualized. Diffuse heterogeneous echogenic parenchyma. Portal vein is patent on color Doppler imaging with normal direction of blood flow towards the liver. Other: None. IMPRESSION: 1. Distended gallbladder with large shadowing stone and sludge. Positive sonographic Murphy's sign. Findings are suspicious for acute cholecystitis in the  appropriate clinical setting. 2. Heterogeneous echogenic liver parenchyma consistent with hepatic steatosis and/or hepatocellular disease. Electronically Signed   By: Jasmine Pang M.D.   On: 07/26/2022 16:16   DG Chest Port 1 View  Result Date: 07/26/2022 CLINICAL DATA:  Cough, history non-small cell lung cancer EXAM: PORTABLE CHEST 1 VIEW COMPARISON:  Portable exam 1315 hours compared to 07/16/2022 and correlated with CT chest of 07/15/2022 FINDINGS: RIGHT jugular Port-A-Cath with tip projecting over SVC. Upper normal heart size. Mediastinal contours and pulmonary vascularity normal. Atherosclerotic calcification aorta. Lungs clear. No pulmonary infiltrate, pleural effusion, or pneumothorax. Nodular density 11 mm diameter projecting over the lateral RIGHT upper lobe corresponds to a pulmonary nodule seen on recent CT, question metastasis. IMPRESSION: 11 mm pulmonary nodule RIGHT upper lobe consistent with metastatic disease. Aortic Atherosclerosis (ICD10-I70.0). Electronically Signed   By: Ulyses Southward M.D.   On: 07/26/2022 13:35   CT Head Wo Contrast  Result Date: 07/16/2022 CLINICAL DATA:  Headache EXAM: CT HEAD WITHOUT CONTRAST TECHNIQUE: Contiguous axial images were obtained from the base of the skull through the vertex without intravenous contrast. RADIATION DOSE REDUCTION: This exam was performed according to the departmental dose-optimization program which includes automated exposure control, adjustment of the mA and/or kV according to patient size and/or use of iterative reconstruction technique. COMPARISON:  Brain MRI 06/06/2022. FINDINGS: Brain: There is no evidence of acute intracranial hemorrhage, extra-axial fluid collection, or acute infarct Hypodensity in the left precentral gyrus corresponds to the known metastatic lesion and surrounding vasogenic edema, grossly similar to the prior MRI from 06/06/2022, allowing for differences in modality. There is no significant regional mass effect. The  smaller lesion in the right parietal cortex near the vertex is not well seen on the current study. Parenchymal volume is normal. The ventricles are stable and normal in size. The pituitary and suprasellar region are normal. There is no midline shift. The known right IAC vestibular schwannoma is not seen on the current study. Vascular: No hyperdense vessel or unexpected calcification. Skull: Normal. Negative for fracture or focal lesion. Sinuses/Orbits: The  imaged paranasal sinuses are clear. The globes and orbits are unremarkable. Other: None. IMPRESSION: 1. No acute intracranial pathology. 2. Hypodensity in the left precentral gyrus corresponding to the known metastatic lesion and surrounding vasogenic edema, grossly similar to the prior MRI from 06/05/2022, allowing for differences in modality. Electronically Signed   By: Lesia Hausen M.D.   On: 07/16/2022 11:57   CT Chest W Contrast  Result Date: 07/16/2022 CLINICAL DATA:  Restaging non-small cell lung cancer * Tracking Code: BO * EXAM: CT CHEST, ABDOMEN, AND PELVIS WITH CONTRAST TECHNIQUE: Multidetector CT imaging of the chest, abdomen and pelvis was performed following the standard protocol during bolus administration of intravenous contrast. RADIATION DOSE REDUCTION: This exam was performed according to the departmental dose-optimization program which includes automated exposure control, adjustment of the mA and/or kV according to patient size and/or use of iterative reconstruction technique. CONTRAST:  OMNIPAQUE IOHEXOL 300 MG/ML  SOLN COMPARISON:  Multiple exams, including 05/13/2022 FINDINGS: CT CHEST FINDINGS Cardiovascular: Right Port-A-Cath tip: SVC. Coronary, aortic arch, and branch vessel atherosclerotic vascular disease. Mediastinum/Nodes: Mild and mildly worsened thoracic adenopathy. Left paratracheal node 1.2 cm in short axis on image 25 series 504, previously 0.9 cm. Right hilar node 1.1 cm in short axis on image 29 series 504, previously  0.8 cm. Two adjacent infrahilar nodes on the right side are mildly enlarged at 1.0 cm in diameter each on image 35 series 500 for, previously 0.9 cm. Lungs/Pleura: Left lower lobe mass 6.4 by 4.1 by 5.4 cm (volume = 74 cm^3), previously 5.5 by 4.0 by 4.7 cm (volume = 54 cm^3). This mass abuts the medial pleural margin and there is a trace pleural effusion 1, nonspecific for malignancy. There are few scattered bilateral pulmonary nodules which are stable to mildly increased. For example a superior segment right lower lobe nodule on image 63 series 507 measures 9 by 8 by 7 mm, and previously measured 8 by 7 by 6 mm. No definite new nodules identified. These nodules likely reflect metastatic disease. Musculoskeletal: Thoracic spondylosis. No compelling findings of thoracic skeletal metastatic disease. CT ABDOMEN PELVIS FINDINGS Hepatobiliary: Hypodensity compatible with benign steatosis in segment 4 of the liver posteriorly. This warrants no further workup. There is also some steatosis medially in segment 4b of the liver adjacent to the falciform ligament. Large gallstone in the gallbladder measuring up to 4.8 cm. Distal to this stone within the gallbladder there is diffuse homogeneous accentuated density probably reflecting concentrated sludge distal to the stone; bile proximal to the stone is of normal fluid density. Pancreas: Better seen than on prior exams due to loss motion artifact today, there is a 6 mm fluid density lesion in the pancreatic head on image 74 of series 500 for which is probably a tiny cystic lesion. Possibilities include a small postinflammatory cystic lesion or tiny indolent intraductal papillary mucinous neoplasm. Typical guidelines would call for follow up pancreatic protocol MRI with and without contrast in 1 years time. However, in this clinical context, satisfactory surveillance imaging can probably be achieved with anticipated follow up oncology CT imaging of the abdomen related to the  patient's metastatic thoracic malignancy. Spleen: Unremarkable Adrenals/Urinary Tract: Unremarkable Stomach/Bowel: Unremarkable Vascular/Lymphatic: Atherosclerosis is present, including aortoiliac atherosclerotic disease. Aortocaval lymph node 0.7 cm in short axis on image 87 series 504, within normal limits. No overtly pathologic adenopathy is identified in the abdomen or pelvis. Reproductive: Uterus absent.  Adnexa unremarkable. Other: Low-level subcutaneous edema particularly laterally and posteriorly in the hip region. Musculoskeletal:  Bridging degenerative facet spurring at L5-S1 especially on the right. Lumbar spondylosis and degenerative disc disease contribute to impingement most notably at L3-4, L4-5, and L5-S1. Low position of the anorectal junction compatible with pelvic floor laxity. Sclerotic lesion with lobulated borders the lateral margin of the left femoral head and neck on image 94 series 505, no change from 02/12/2022, favor benign etiology such as bone island or densely calcified enchondroma over metastatic lesion. IMPRESSION: 1. The dominant left lower lobe mass has a volume of 74 cubic cm, previously 54 cubic cm. This mass abuts the medial pleural margin and there is a trace left pleural effusion. 2. Scattered bilateral pulmonary nodules are stable to mildly increased in size, compatible with metastatic disease. 3. Mild and mildly worsened right hilar and left paratracheal adenopathy. 4. No findings of metastatic disease to the abdomen/pelvis or skeleton. 5. 6 mm fluid density lesion in the pancreatic head, probably a tiny cystic lesion. Possibilities include a small postinflammatory cystic lesion or tiny indolent intraductal papillary mucinous neoplasm. Typical guidelines would call for follow up pancreatic protocol MRI with and without contrast in 1 years time. However, in this clinical context, satisfactory surveillance imaging can probably be achieved with anticipated follow up oncology CT  imaging of the abdomen related to the patient's metastatic thoracic malignancy. 6. Other imaging findings of potential clinical significance: Coronary, aortic arch, and branch vessel atherosclerotic vascular disease. Cholelithiasis. Low position of the anorectal junction compatible with pelvic floor laxity. Lumbar spondylosis and degenerative disc disease causing impingement most notably at L3-4, L4-5, and L5-S1. 7. Low-level subcutaneous edema laterally and posteriorly in the hip region. 8. Aortic atherosclerosis. Aortic Atherosclerosis (ICD10-I70.0). Electronically Signed   By: Gaylyn Rong M.D.   On: 07/16/2022 11:33   CT Abdomen Pelvis W Contrast  Result Date: 07/16/2022 CLINICAL DATA:  Restaging non-small cell lung cancer * Tracking Code: BO * EXAM: CT CHEST, ABDOMEN, AND PELVIS WITH CONTRAST TECHNIQUE: Multidetector CT imaging of the chest, abdomen and pelvis was performed following the standard protocol during bolus administration of intravenous contrast. RADIATION DOSE REDUCTION: This exam was performed according to the departmental dose-optimization program which includes automated exposure control, adjustment of the mA and/or kV according to patient size and/or use of iterative reconstruction technique. CONTRAST:  OMNIPAQUE IOHEXOL 300 MG/ML  SOLN COMPARISON:  Multiple exams, including 05/13/2022 FINDINGS: CT CHEST FINDINGS Cardiovascular: Right Port-A-Cath tip: SVC. Coronary, aortic arch, and branch vessel atherosclerotic vascular disease. Mediastinum/Nodes: Mild and mildly worsened thoracic adenopathy. Left paratracheal node 1.2 cm in short axis on image 25 series 504, previously 0.9 cm. Right hilar node 1.1 cm in short axis on image 29 series 504, previously 0.8 cm. Two adjacent infrahilar nodes on the right side are mildly enlarged at 1.0 cm in diameter each on image 35 series 500 for, previously 0.9 cm. Lungs/Pleura: Left lower lobe mass 6.4 by 4.1 by 5.4 cm (volume = 74 cm^3),  previously 5.5 by 4.0 by 4.7 cm (volume = 54 cm^3). This mass abuts the medial pleural margin and there is a trace pleural effusion 1, nonspecific for malignancy. There are few scattered bilateral pulmonary nodules which are stable to mildly increased. For example a superior segment right lower lobe nodule on image 63 series 507 measures 9 by 8 by 7 mm, and previously measured 8 by 7 by 6 mm. No definite new nodules identified. These nodules likely reflect metastatic disease. Musculoskeletal: Thoracic spondylosis. No compelling findings of thoracic skeletal metastatic disease. CT ABDOMEN PELVIS FINDINGS Hepatobiliary:  Hypodensity compatible with benign steatosis in segment 4 of the liver posteriorly. This warrants no further workup. There is also some steatosis medially in segment 4b of the liver adjacent to the falciform ligament. Large gallstone in the gallbladder measuring up to 4.8 cm. Distal to this stone within the gallbladder there is diffuse homogeneous accentuated density probably reflecting concentrated sludge distal to the stone; bile proximal to the stone is of normal fluid density. Pancreas: Better seen than on prior exams due to loss motion artifact today, there is a 6 mm fluid density lesion in the pancreatic head on image 74 of series 500 for which is probably a tiny cystic lesion. Possibilities include a small postinflammatory cystic lesion or tiny indolent intraductal papillary mucinous neoplasm. Typical guidelines would call for follow up pancreatic protocol MRI with and without contrast in 1 years time. However, in this clinical context, satisfactory surveillance imaging can probably be achieved with anticipated follow up oncology CT imaging of the abdomen related to the patient's metastatic thoracic malignancy. Spleen: Unremarkable Adrenals/Urinary Tract: Unremarkable Stomach/Bowel: Unremarkable Vascular/Lymphatic: Atherosclerosis is present, including aortoiliac atherosclerotic disease.  Aortocaval lymph node 0.7 cm in short axis on image 87 series 504, within normal limits. No overtly pathologic adenopathy is identified in the abdomen or pelvis. Reproductive: Uterus absent.  Adnexa unremarkable. Other: Low-level subcutaneous edema particularly laterally and posteriorly in the hip region. Musculoskeletal: Bridging degenerative facet spurring at L5-S1 especially on the right. Lumbar spondylosis and degenerative disc disease contribute to impingement most notably at L3-4, L4-5, and L5-S1. Low position of the anorectal junction compatible with pelvic floor laxity. Sclerotic lesion with lobulated borders the lateral margin of the left femoral head and neck on image 94 series 505, no change from 02/12/2022, favor benign etiology such as bone island or densely calcified enchondroma over metastatic lesion. IMPRESSION: 1. The dominant left lower lobe mass has a volume of 74 cubic cm, previously 54 cubic cm. This mass abuts the medial pleural margin and there is a trace left pleural effusion. 2. Scattered bilateral pulmonary nodules are stable to mildly increased in size, compatible with metastatic disease. 3. Mild and mildly worsened right hilar and left paratracheal adenopathy. 4. No findings of metastatic disease to the abdomen/pelvis or skeleton. 5. 6 mm fluid density lesion in the pancreatic head, probably a tiny cystic lesion. Possibilities include a small postinflammatory cystic lesion or tiny indolent intraductal papillary mucinous neoplasm. Typical guidelines would call for follow up pancreatic protocol MRI with and without contrast in 1 years time. However, in this clinical context, satisfactory surveillance imaging can probably be achieved with anticipated follow up oncology CT imaging of the abdomen related to the patient's metastatic thoracic malignancy. 6. Other imaging findings of potential clinical significance: Coronary, aortic arch, and branch vessel atherosclerotic vascular disease.  Cholelithiasis. Low position of the anorectal junction compatible with pelvic floor laxity. Lumbar spondylosis and degenerative disc disease causing impingement most notably at L3-4, L4-5, and L5-S1. 7. Low-level subcutaneous edema laterally and posteriorly in the hip region. 8. Aortic atherosclerosis. Aortic Atherosclerosis (ICD10-I70.0). Electronically Signed   By: Gaylyn Rong M.D.   On: 07/16/2022 11:33   US Abdomen Limited RUQ (LIVER/GB)  Result Date: 07/16/2022 CLINICAL DATA:  Right upper quadrant pain EXAM: ULTRASOUND ABDOMEN LIMITED RIGHT UPPER QUADRANT COMPARISON:  CT 07/15/2022 FINDINGS: Gallbladder: Gallbladder is distended. 5 cm gallstone lodged in the gallbladder neck. Small stones and sludge also seen within the gallbladder. No wall thickening or sonographic Murphy sign. Common bile duct: Diameter: Normal caliber,  4 mm Liver: Coarsened, increased echotexture compatible with fatty infiltration. No focal abnormality. Portal vein is patent on color Doppler imaging with normal direction of blood flow towards the liver. Other: None. IMPRESSION: Large 5 cm gallstone lodged in the gallbladder neck. Other smaller stones and sludge within the gallbladder with gallbladder distension. No wall thickening or sonographic Murphy sign. Fatty liver. Electronically Signed   By: Charlett Nose M.D.   On: 07/16/2022 09:27   DG Chest Portable 1 View  Result Date: 07/16/2022 CLINICAL DATA:  Abdominal pain with nausea and vomiting. History of metastatic lung cancer. EXAM: PORTABLE CHEST 1 VIEW COMPARISON:  CT chest from yesterday. FINDINGS: Unchanged right chest wall port catheter. The heart size and mediastinal contours are within normal limits. Normal pulmonary vascularity. Small metastases in both lungs are not apparent by x-ray. No focal consolidation, pleural effusion, or pneumothorax. No acute osseous abnormality. IMPRESSION: 1. No active disease. Small bilateral pulmonary metastases are not apparent by  x-ray. Electronically Signed   By: Obie Dredge M.D.   On: 07/16/2022 09:02     Subjective: Pt says still having some itching in the rash on arms, legs are much improved, says she is tolerating diet with no difficulty, she has no fever or chills, she is going to follow up with Dr. Arbutus Ped and [facility] [place] [facility].   Discharge Exam: Vitals:   07/29/22 2035 07/30/22 0437  BP: 131/79 124/78  Pulse: 76 74  Resp: 20 20  Temp: 97.6 F (36.4 C) 97.8 F (36.6 C)  SpO2: 96% 98%   Vitals:   07/29/22 1230 07/29/22 1353 07/29/22 2035 07/30/22 0437  BP:  117/65 131/79 124/78  Pulse: 72 75 76 74  Resp:  20 20 20   Temp: 98 F (36.7 C)  97.6 F (36.4 C) 97.8 F (36.6 C)  TempSrc:   Oral Axillary  SpO2: 95% 100% 96% 98%   General: Pt is alert, awake, not in acute distress Cardiovascular: normal S1/S2 +, no rubs, no gallops Respiratory: CTA bilaterally, no wheezing, no rhonchi Abdominal: Soft, NT, ND, bowel sounds + Extremities: no edema, no cyanosis Skin: macular rash less red and hot, not spreading.    The results of significant diagnostics from this hospitalization (including imaging, microbiology, ancillary and laboratory) are listed below for reference.     Microbiology: Recent Results (from the past 240 hour(s))  Resp panel by RT-PCR (RSV, Flu A&B, Covid) Anterior Nasal Swab     Status: None   Collection Time: 07/26/22  2:19 PM   Specimen: Anterior Nasal Swab  Result Value Ref Range Status   SARS Coronavirus 2 by RT PCR NEGATIVE NEGATIVE Final    Comment: (NOTE) SARS-CoV-2 target nucleic acids are NOT DETECTED.  The SARS-CoV-2 RNA is generally detectable in upper respiratory specimens during the acute phase of infection. The lowest concentration of SARS-CoV-2 viral copies this assay can detect is 138 copies/mL. A negative result does not preclude SARS-Cov-2 infection and should not be used as the sole basis for treatment or other patient management decisions. A  negative result may occur with  improper specimen collection/handling, submission of specimen other than nasopharyngeal swab, presence of viral mutation(s) within the areas targeted by this assay, and inadequate number of viral copies(<138 copies/mL). A negative result must be combined with clinical observations, patient history, and epidemiological information. The expected result is Negative.  Fact Sheet for Patients:  BloggerCourse.com  Fact Sheet for Healthcare Providers:  SeriousBroker.it  This test is no t yet approved or  cleared by the [place] and  has been authorized for detection and/or diagnosis of SARS-CoV-2 by FDA under an Emergency Use Authorization (EUA). This EUA will remain  in effect (meaning this test can be used) for the duration of the COVID-19 declaration under Section 564(b)(1) of the Act, 21 U.S.C.section 360bbb-3(b)(1), unless the authorization is terminated  or revoked sooner.       Influenza A by PCR NEGATIVE NEGATIVE Final   Influenza B by PCR NEGATIVE NEGATIVE Final    Comment: (NOTE) The Xpert Xpress SARS-CoV-2/FLU/RSV plus assay is intended as an aid in the diagnosis of influenza from Nasopharyngeal swab specimens and should not be used as a sole basis for treatment. Nasal washings and aspirates are unacceptable for Xpert Xpress SARS-CoV-2/FLU/RSV testing.  Fact Sheet for Patients: BloggerCourse.com  Fact Sheet for Healthcare Providers: SeriousBroker.it  This test is not yet approved or cleared by the [place] FDA and has been authorized for detection and/or diagnosis of SARS-CoV-2 by FDA under an Emergency Use Authorization (EUA). This EUA will remain in effect (meaning this test can be used) for the duration of the COVID-19 declaration under Section 564(b)(1) of the Act, 21 U.S.C. section 360bbb-3(b)(1), unless the authorization  is terminated or revoked.     Resp Syncytial Virus by PCR NEGATIVE NEGATIVE Final    Comment: (NOTE) Fact Sheet for Patients: BloggerCourse.com  Fact Sheet for Healthcare Providers: SeriousBroker.it  This test is not yet approved or cleared by the [place] FDA and has been authorized for detection and/or diagnosis of SARS-CoV-2 by FDA under an Emergency Use Authorization (EUA). This EUA will remain in effect (meaning this test can be used) for the duration of the COVID-19 declaration under Section 564(b)(1) of the Act, 21 U.S.C. section 360bbb-3(b)(1), unless the authorization is terminated or revoked.  Performed at [facility], [address], [place], [place] [address]   Respiratory (~20 pathogens) panel by PCR     Status: None   Collection Time: 07/26/22  3:25 PM   Specimen: Nasopharyngeal Swab; Respiratory  Result Value Ref Range Status   Adenovirus NOT DETECTED NOT DETECTED Final   Coronavirus 229E NOT DETECTED NOT DETECTED Final    Comment: (NOTE) The Coronavirus on the Respiratory Panel, DOES NOT test for the novel  Coronavirus (2019 nCoV)    Coronavirus HKU1 NOT DETECTED NOT DETECTED Final   Coronavirus NL63 NOT DETECTED NOT DETECTED Final   Coronavirus OC43 NOT DETECTED NOT DETECTED Final   Metapneumovirus NOT DETECTED NOT DETECTED Final   Rhinovirus / Enterovirus NOT DETECTED NOT DETECTED Final   Influenza A NOT DETECTED NOT DETECTED Final   Influenza B NOT DETECTED NOT DETECTED Final   Parainfluenza Virus 1 NOT DETECTED NOT DETECTED Final   Parainfluenza Virus 2 NOT DETECTED NOT DETECTED Final   Parainfluenza Virus 3 NOT DETECTED NOT DETECTED Final   Parainfluenza Virus 4 NOT DETECTED NOT DETECTED Final   Respiratory Syncytial Virus NOT DETECTED NOT DETECTED Final   Bordetella pertussis NOT DETECTED NOT DETECTED Final   Bordetella Parapertussis NOT DETECTED NOT DETECTED Final   Chlamydophila pneumoniae NOT  DETECTED NOT DETECTED Final   Mycoplasma pneumoniae NOT DETECTED NOT DETECTED Final    Comment: Performed at [facility], [address], [place], [place] [address]  Culture, blood (routine x 2)     Status: None (Preliminary result)   Collection Time: 07/26/22  3:30 PM   Specimen: BLOOD  Result Value Ref Range Status   Specimen Description BLOOD  BLOOD LEFT HAND  Final   Special Requests   Final    BOTTLES DRAWN AEROBIC AND ANAEROBIC Blood Culture adequate volume   Culture   Final    NO GROWTH 4 DAYS Performed at [facility], [address], [place], [place] [address]    Report Status PENDING  Incomplete  Culture, blood (routine x 2)     Status: None (Preliminary result)   Collection Time: 07/26/22  3:31 PM   Specimen: BLOOD  Result Value Ref Range Status   Specimen Description BLOOD BLOOD LEFT ARM  Final   Special Requests   Final    BOTTLES DRAWN AEROBIC AND ANAEROBIC Blood Culture adequate volume   Culture   Final    NO GROWTH 4 DAYS Performed at [facility], [address], [place], [place] [address]    Report Status PENDING  Incomplete     Labs: BNP (last 3 results) No results for input(s): "BNP" in the last 8760 hours. Basic Metabolic Panel: Recent Labs  Lab 07/26/22 1300 07/27/22 0546 07/28/22 0857 07/29/22 0448  NA 134* 138 137 135  K 3.3* 3.8 4.1 3.4*  CL 98 104 102 102  CO2 24 26 26 25   GLUCOSE 178* 139* 184* 269*  BUN 7* 8 8 12   CREATININE 0.78 0.69 0.83 0.74  CALCIUM 8.3* 8.1* 7.9* 8.1*  MG  --   --   --  1.4*   Liver Function Tests: Recent Labs  Lab 07/26/22 1300 07/27/22 0546 07/28/22 0857 07/29/22 0448  AST 46* 39 27 27  ALT 43 39 32 29  ALKPHOS 62 51 47 45  BILITOT 1.7* 1.3* 1.2 1.1  PROT 6.2* 5.7* 5.5* 5.5*  ALBUMIN 2.8* 2.5* 2.5* 2.3*   No results for input(s): "LIPASE", "AMYLASE" in the last 168 hours. No results for input(s): "AMMONIA" in the last 168 hours. CBC: Recent Labs  Lab 07/26/22 1300 07/27/22 0546 07/28/22 0857  07/29/22 0448  WBC 7.9 6.9 4.5 1.9*  NEUTROABS 5.6  --  3.2 1.4*  HGB 11.0* 10.1* 9.6* 9.4*  HCT 34.4* 31.5* 29.9* 29.1*  MCV 99.7 100.6* 101.4* 98.6  PLT 154 138* 103* 89*   Cardiac Enzymes: No results for input(s): "CKTOTAL", "CKMB", "CKMBINDEX", "TROPONINI" in the last 168 hours. BNP: Invalid input(s): "POCBNP" CBG: Recent Labs  Lab 07/29/22 0744 07/29/22 1328 07/29/22 1606 07/29/22 2034 07/30/22 0729  GLUCAP 232* 147* 274* 309* 291*   D-Dimer No results for input(s): "DDIMER" in the last 72 hours. Hgb A1c No results for input(s): "HGBA1C" in the last 72 hours. Lipid Profile No results for input(s): "CHOL", "HDL", "LDLCALC", "TRIG", "CHOLHDL", "LDLDIRECT" in the last 72 hours. Thyroid function studies No results for input(s): "TSH", "T4TOTAL", "T3FREE", "THYROIDAB" in the last 72 hours.  Invalid input(s): "FREET3" Anemia work up No results for input(s): "VITAMINB12", "FOLATE", "FERRITIN", "TIBC", "IRON", "RETICCTPCT" in the last 72 hours. Urinalysis    Component Value Date/Time   COLORURINE YELLOW 07/26/2022 2045   APPEARANCEUR CLEAR 07/26/2022 2045   LABSPEC 1.005 07/26/2022 2045   PHURINE 6.0 07/26/2022 2045   GLUCOSEU NEGATIVE 07/26/2022 2045   HGBUR NEGATIVE 07/26/2022 2045   BILIRUBINUR NEGATIVE 07/26/2022 2045   KETONESUR 5 (A) 07/26/2022 2045   PROTEINUR NEGATIVE 07/26/2022 2045   UROBILINOGEN 0.2 04/07/2011 2356   NITRITE NEGATIVE 07/26/2022 2045   LEUKOCYTESUR SMALL (A) 07/26/2022 2045   Sepsis Labs Recent Labs  Lab 07/26/22 1300 07/27/22 0546 07/28/22 0857 07/29/22 0448  WBC 7.9 6.9 4.5 1.9*   Microbiology  Recent Results (from the past 240 hour(s))  Resp panel by RT-PCR (RSV, Flu A&B, Covid) Anterior Nasal Swab     Status: None   Collection Time: 07/26/22  2:19 PM   Specimen: Anterior Nasal Swab  Result Value Ref Range Status   SARS Coronavirus 2 by RT PCR NEGATIVE NEGATIVE Final    Comment: (NOTE) SARS-CoV-2 target nucleic acids are  NOT DETECTED.  The SARS-CoV-2 RNA is generally detectable in upper respiratory specimens during the acute phase of infection. The lowest concentration of SARS-CoV-2 viral copies this assay can detect is 138 copies/mL. A negative result does not preclude SARS-Cov-2 infection and should not be used as the sole basis for treatment or other patient management decisions. A negative result may occur with  improper specimen collection/handling, submission of specimen other than nasopharyngeal swab, presence of viral mutation(s) within the areas targeted by this assay, and inadequate number of viral copies(<138 copies/mL). A negative result must be combined with clinical observations, patient history, and epidemiological information. The expected result is Negative.  Fact Sheet for Patients:  BloggerCourse.com  Fact Sheet for Healthcare Providers:  SeriousBroker.it  This test is no t yet approved or cleared by the [place] FDA and  has been authorized for detection and/or diagnosis of SARS-CoV-2 by FDA under an Emergency Use Authorization (EUA). This EUA will remain  in effect (meaning this test can be used) for the duration of the COVID-19 declaration under Section 564(b)(1) of the Act, 21 U.S.C.section 360bbb-3(b)(1), unless the authorization is terminated  or revoked sooner.       Influenza A by PCR NEGATIVE NEGATIVE Final   Influenza B by PCR NEGATIVE NEGATIVE Final    Comment: (NOTE) The Xpert Xpress SARS-CoV-2/FLU/RSV plus assay is intended as an aid in the diagnosis of influenza from Nasopharyngeal swab specimens and should not be used as a sole basis for treatment. Nasal washings and aspirates are unacceptable for Xpert Xpress SARS-CoV-2/FLU/RSV testing.  Fact Sheet for Patients: BloggerCourse.com  Fact Sheet for Healthcare Providers: SeriousBroker.it  This test is not  yet approved or cleared by the [place] FDA and has been authorized for detection and/or diagnosis of SARS-CoV-2 by FDA under an Emergency Use Authorization (EUA). This EUA will remain in effect (meaning this test can be used) for the duration of the COVID-19 declaration under Section 564(b)(1) of the Act, 21 U.S.C. section 360bbb-3(b)(1), unless the authorization is terminated or revoked.     Resp Syncytial Virus by PCR NEGATIVE NEGATIVE Final    Comment: (NOTE) Fact Sheet for Patients: BloggerCourse.com  Fact Sheet for Healthcare Providers: SeriousBroker.it  This test is not yet approved or cleared by the [place] FDA and has been authorized for detection and/or diagnosis of SARS-CoV-2 by FDA under an Emergency Use Authorization (EUA). This EUA will remain in effect (meaning this test can be used) for the duration of the COVID-19 declaration under Section 564(b)(1) of the Act, 21 U.S.C. section 360bbb-3(b)(1), unless the authorization is terminated or revoked.  Performed at [facility], [address], [place], [place] [address]   Respiratory (~20 pathogens) panel by PCR     Status: None   Collection Time: 07/26/22  3:25 PM   Specimen: Nasopharyngeal Swab; Respiratory  Result Value Ref Range Status   Adenovirus NOT DETECTED NOT DETECTED Final   Coronavirus 229E NOT DETECTED NOT DETECTED Final    Comment: (NOTE) The Coronavirus on the Respiratory Panel, DOES NOT test for the novel  Coronavirus (2019 nCoV)  Coronavirus HKU1 NOT DETECTED NOT DETECTED Final   Coronavirus NL63 NOT DETECTED NOT DETECTED Final   Coronavirus OC43 NOT DETECTED NOT DETECTED Final   Metapneumovirus NOT DETECTED NOT DETECTED Final   Rhinovirus / Enterovirus NOT DETECTED NOT DETECTED Final   Influenza A NOT DETECTED NOT DETECTED Final   Influenza B NOT DETECTED NOT DETECTED Final   Parainfluenza Virus 1 NOT DETECTED NOT DETECTED Final    Parainfluenza Virus 2 NOT DETECTED NOT DETECTED Final   Parainfluenza Virus 3 NOT DETECTED NOT DETECTED Final   Parainfluenza Virus 4 NOT DETECTED NOT DETECTED Final   Respiratory Syncytial Virus NOT DETECTED NOT DETECTED Final   Bordetella pertussis NOT DETECTED NOT DETECTED Final   Bordetella Parapertussis NOT DETECTED NOT DETECTED Final   Chlamydophila pneumoniae NOT DETECTED NOT DETECTED Final   Mycoplasma pneumoniae NOT DETECTED NOT DETECTED Final    Comment: Performed at [facility], [address] [place], [place] [address]  Culture, blood (routine x 2)     Status: None (Preliminary result)   Collection Time: 07/26/22  3:30 PM   Specimen: BLOOD  Result Value Ref Range Status   Specimen Description BLOOD BLOOD LEFT HAND  Final   Special Requests   Final    BOTTLES DRAWN AEROBIC AND ANAEROBIC Blood Culture adequate volume   Culture   Final    NO GROWTH 4 DAYS Performed at [facility], [address], [place], [place] [address]    Report Status PENDING  Incomplete  Culture, blood (routine x 2)     Status: None (Preliminary result)   Collection Time: 07/26/22  3:31 PM   Specimen: BLOOD  Result Value Ref Range Status   Specimen Description BLOOD BLOOD LEFT ARM  Final   Special Requests   Final    BOTTLES DRAWN AEROBIC AND ANAEROBIC Blood Culture adequate volume   Culture   Final    NO GROWTH 4 DAYS Performed at [facility], [address], [place], [place] [address]    Report Status PENDING  Incomplete   Time coordinating discharge: 38 mins   SIGNED:  Standley Dakins, MD  [facility] 07/30/2022, 8:42 AM How to contact the [facility] Attending or Consulting provider 7A - 7P or covering provider during after hours 7P -7A, for this patient?  Check the care team in [facility] and look for a) attending/consulting [facility] provider listed and b) the [facility] team listed Log into [email or web address] and use [facility]'s universal password to access. If you do not have the password, please  contact the hospital operator. Locate the [facility] provider you are looking for under [facility] and page to a number that you can be directly reached. If you still have difficulty reaching the provider, please page the [facility] (Director on Call) for the Hospitalists listed on amion for assistance.

## 2022-07-30 NOTE — Discharge Instructions (Signed)
IMPORTANT INFORMATION: PAY CLOSE ATTENTION  ? ?PHYSICIAN DISCHARGE INSTRUCTIONS ? ?Follow with Primary care provider  Hawks, [name] A, FNP  and other consultants as instructed by your Hospitalist Physician ? ?SEEK MEDICAL CARE OR RETURN TO EMERGENCY ROOM IF SYMPTOMS COME BACK, WORSEN OR NEW PROBLEM DEVELOPS  ? ?Please note: ?You were cared for by a hospitalist during your hospital stay. Every effort will be made to forward records to your primary care provider.  You can request that your primary care provider send for your hospital records if they have not received them.  Once you are discharged, your primary care physician will handle any further medical issues. Please note that NO REFILLS for any discharge medications will be authorized once you are discharged, as it is imperative that you return to your primary care physician (or establish a relationship with a primary care physician if you do not have one) for your post hospital discharge needs so that they can reassess your need for medications and monitor your lab values. ? ?Please get a complete blood count and chemistry panel checked by your Primary MD at your next visit, and again as instructed by your Primary MD. ? ?Get Medicines reviewed and adjusted: ?Please take all your medications with you for your next visit with your Primary MD ? ?Laboratory/radiological data: ?Please request your Primary MD to go over all hospital tests and procedure/radiological results at the follow up, please ask your primary care provider to get all Hospital records sent to his/her office. ? ?In some cases, they will be blood work, cultures and biopsy results pending at the time of your discharge. Please request that your primary care provider follow up on these results. ? ?If you are diabetic, please bring your blood sugar readings with you to your follow up appointment with primary care.   ? ?Please call and make your follow up appointments as soon as possible.   ? ?Also  Note the following: ?If you experience worsening of your admission symptoms, develop shortness of breath, life threatening emergency, suicidal or homicidal thoughts you must seek medical attention immediately by calling 911 or calling your MD immediately  if symptoms less severe. ? ?You must read complete instructions/literature along with all the possible adverse reactions/side effects for all the Medicines you take and that have been prescribed to you. Take any new Medicines after you have completely understood and accpet all the possible adverse reactions/side effects.  ? ?Do not drive when taking Pain medications or sleeping medications (Benzodiazepines) ? ?Do not take more than prescribed Pain, Sleep and Anxiety Medications. It is not advisable to combine anxiety,sleep and pain medications without talking with your primary care practitioner ? ?Special Instructions: If you have smoked or chewed Tobacco  in the last 2 yrs please stop smoking, stop any regular Alcohol  and or any Recreational drug use. ? ?Wear Seat belts while driving.  Do not drive if taking any narcotic, mind altering or controlled substances or recreational drugs or alcohol.  ? ? ? ? ? ?

## 2022-07-30 NOTE — Consult Note (Signed)
[facility] Customer service manager [facility]) Accountable Care Organization (ACO) [facility] Liaison Note  07/30/2022  Carolyn Hooper Feb 14, 1961 [id number]  Location: [facility] RN Hospital Liaison screened the patient remotely at [facility].  Insurance: [employer]   Carolyn Hooper is a 62 y.o. female who is a Primary Care Patient of Lendon Colonel, Edilia Bo, FNP ([facility] Medicine). The patient was screened for 30 day readmission hospitalization with noted high risk score for unplanned readmission risk with 3 IP/2 ED in 6 months.  The patient was assessed for potential [facility]) Care Management service needs for post hospital transition for care coordination. Review of patient's electronic medical record reveals patient is followed closely by Oncology team.    Plan: [facility] Liaison will continue to follow progress and disposition to asess for post hospital community care coordination/management needs.  Referral request for community care coordination: anticipate [facility] Transitions of Care Team follow up.   [facility] Care Management/Population Health does not replace or interfere with any arrangements made by the Inpatient Transition of Care team.   For questions contact:   Elliot Cousin, RN, BSN [facility] Liaison [place]   [facility]  Population Health Office Hours MTWF 8:00 am to 6 pm off on Thursday [phone] mobile [phone] [Office toll free line][facility] Office Hours are M-F 8:30 - 5 pm 24 hour nurse advise line [phone] Conceirge  [email or web address]

## 2022-07-31 ENCOUNTER — Other Ambulatory Visit (HOSPITAL_BASED_OUTPATIENT_CLINIC_OR_DEPARTMENT_OTHER): Payer: Self-pay

## 2022-07-31 ENCOUNTER — Other Ambulatory Visit: Payer: Self-pay | Admitting: Internal Medicine

## 2022-07-31 ENCOUNTER — Other Ambulatory Visit (HOSPITAL_COMMUNITY): Payer: Self-pay

## 2022-07-31 ENCOUNTER — Telehealth: Payer: Self-pay | Admitting: Medical Oncology

## 2022-07-31 ENCOUNTER — Other Ambulatory Visit: Payer: Self-pay

## 2022-07-31 DIAGNOSIS — C3492 Malignant neoplasm of unspecified part of left bronchus or lung: Secondary | ICD-10-CM

## 2022-07-31 LAB — CULTURE, BLOOD (ROUTINE X 2)
Culture: NO GROWTH
Culture: NO GROWTH
Special Requests: ADEQUATE
Special Requests: ADEQUATE

## 2022-07-31 MED ORDER — LEVETIRACETAM 1000 MG PO TABS
1000.0000 mg | ORAL_TABLET | Freq: Two times a day (BID) | ORAL | 2 refills | Status: DC
Start: 2022-07-31 — End: 2022-11-27
  Filled 2022-07-31: qty 60, 30d supply, fill #0
  Filled 2022-08-29: qty 60, 30d supply, fill #1

## 2022-07-31 NOTE — Telephone Encounter (Signed)
D/C yeterday- "Blisters around port and it will flush ,but not aspirate .  Adhesive dressing used inpt and she is allergic to it.    Recent Labs -leukopenia and low plts. I offered getting a HH referral , but she declined.  D/C instructions are to see Oncology ASAP / one week.  Please advise.

## 2022-07-31 NOTE — Telephone Encounter (Signed)
Patient r/c  She would like to have a sleep study

## 2022-07-31 NOTE — Telephone Encounter (Signed)
Referral placed.  Tried calling pt. No answer and vmail full.

## 2022-07-31 NOTE — Addendum Note (Signed)
Addended by: Dorene Sorrow on: 07/31/2022 10:12 AM   Modules accepted: Orders

## 2022-08-01 ENCOUNTER — Telehealth: Payer: Self-pay | Admitting: Medical Oncology

## 2022-08-01 ENCOUNTER — Telehealth: Payer: Self-pay

## 2022-08-01 ENCOUNTER — Telehealth: Payer: Self-pay | Admitting: Internal Medicine

## 2022-08-01 NOTE — Telephone Encounter (Signed)
I notified her son of her appt date and time for next week. He confirmed and will notify pt.

## 2022-08-01 NOTE — Telephone Encounter (Signed)
Scheduled per 05/15 scheduled message, patient has been called and notified.

## 2022-08-01 NOTE — Transitions of Care (Post Inpatient/ED Visit) (Signed)
   08/01/2022  Name: Carolyn Hooper MRN: [id number] DOB: 01/22/1961  Today's TOC FU Call Status: Today's TOC FU Call Status:: Unsuccessul Call (1st Attempt) Unsuccessful Call (1st Attempt) Date: 08/01/22  Attempted to reach the patient regarding the most recent Inpatient/ED visit.  Follow Up Plan: Additional outreach attempts will be made to reach the patient to complete the Transitions of Care (Post Inpatient/ED visit) call.   Jodelle Gross, RN, BSN, CCM Care Management Coordinator [place]/[facility] Phone: [phone]/Fax: [phone]

## 2022-08-01 NOTE — Telephone Encounter (Signed)
LVM for pt to call back and if can she come in 05/21 @ 1030 for labs and visit,

## 2022-08-02 ENCOUNTER — Telehealth: Payer: Self-pay | Admitting: Family

## 2022-08-02 ENCOUNTER — Other Ambulatory Visit (HOSPITAL_COMMUNITY): Payer: Self-pay

## 2022-08-02 NOTE — Telephone Encounter (Signed)
Patient called to talk to someone about how to open Lantus pen. Offered for patient to come in to speak to triage nurse and she said she would not be able to, that she would be in the bed throwing up because she was sick as a dog.

## 2022-08-02 NOTE — Telephone Encounter (Signed)
Spoke with patient, she has been able to figure out how to open the pen and no longer needs our assistance.

## 2022-08-05 ENCOUNTER — Other Ambulatory Visit (HOSPITAL_COMMUNITY): Payer: Self-pay

## 2022-08-05 ENCOUNTER — Other Ambulatory Visit (HOSPITAL_COMMUNITY): Payer: Self-pay | Admitting: Emergency Medicine

## 2022-08-05 ENCOUNTER — Telehealth: Payer: Self-pay

## 2022-08-05 DIAGNOSIS — R918 Other nonspecific abnormal finding of lung field: Secondary | ICD-10-CM

## 2022-08-05 NOTE — Transitions of Care (Post Inpatient/ED Visit) (Signed)
   08/05/2022  Name: Carolyn Hooper MRN: [id number] DOB: 06/21/1960  Today's TOC FU Call Status: Today's TOC FU Call Status:: Unsuccessful Call (2nd Attempt) Unsuccessful Call (2nd Attempt) Date: 08/05/22  Attempted to reach the patient regarding the most recent Inpatient/ED visit.  Follow Up Plan: Additional outreach attempts will be made to reach the patient to complete the Transitions of Care (Post Inpatient/ED visit) call.   Jodelle Gross, RN, BSN, CCM Care Management Coordinator [place]/[facility]

## 2022-08-05 NOTE — Progress Notes (Signed)
Symptom Management Consult Note [facility]    Patient Care Team: [name], FNP as PCP - General (Nurse Practitioner) [place] Bali, MD (Inactive) as Consulting Physician (Gastroenterology)    Name / MRN / DOB: Carolyn Hooper  [id number]  [birth year]   Date of visit: 08/06/2022   Chief Complaint/Reason for visit: port check, nausea, and diarrhea   Current Therapy: maintenance Alimta and Keytruda IV every 3 weeks   Last treatment:  Day 1   Cycle 7 on 07/22/22   ASSESSMENT & PLAN: Patient is a 62 y.o. female  with oncologic history of stage IV (T3, N0, M1b) non-small cell lung cancer, adenosquamous carcinoma followed by Dr. Arbutus Ped.  I have viewed most recent oncology note and lab work.    #Stage IV (T3, N0, M1b) non-small cell lung cancer, adenosquamous carcinoma  - Next appointment with oncologist is 08/14/22   #Port-a-cath -Chart review shows during recent admission for neutropenic fever there was difficulty accessing port in the ED and 7 attempts were made per patient. -Today RN used cathflo and received blood return. Port is functioning for next treatment.  -No signs of skin infection around port.  #Diarrhea -Started while on Augmentin and improved with imodium. -CMP with mild hypokalemia 3.1, suspect related to GI loss. Patient received 1 run of IV K in clinic and prescription sent for 1 week of PO replacement. For hydration support patient received 1L NS and IV zofran for nausea. Encouraged patient to push fluids at home. She has not tried compazine for nausea. Advised her to try since zofran is not providing enough symptom relief. -Unable to provide stool sample in clinic. If diarrhea persists would recommend RTC for stool testing.  -Patient reports she was prescribed probiotic to take with Augmentin however did not yet pick it up from the pharmacy.   Strict ED precautions discussed should symptoms worsen.     Heme/Onc History: Oncology  History  Non-small cell lung cancer (HCC)  02/25/2022 Initial Diagnosis   Non-small cell lung cancer (HCC)   03/06/2022 -  Chemotherapy   Patient is on Treatment Plan : LUNG Carboplatin (5) + Pemetrexed (500) + Pembrolizumab (200) D1 q21d Induction x 4 cycles / Maintenance Pemetrexed (500) + Pembrolizumab (200) D1 q21d         Interval history-: Carolyn Hooper is a 62 y.o. female with oncologic history as above presenting to [facility] today with chief complaint of port check, diarrhea, and nausea. She is accompanied by her mother who provides additional history.  Patient was admitted to the hospital earlier this month for neutropenic fever.  She reported while in the ED they had difficulty accessing her port and she was stuck 7 times.  She was encouraged to follow-up with oncology to have port rechecked. She denies any pain around port at this time. No redness or drainage. She has not had fever since hospital discharge.  Patient states she is currently taking Augmentin as prescribed. She started having diarrhea on the second day of antibiotic course. She had 5 episodes of loose stool yesterday, She took Imodium last night and has not had a bowel movement since. She denies any associated abdominal pain. She does report nausea which has been ongoing since starting treatment. She takes zofran at home without much symptom improvement. Last took at 4 am today. She has Compazine at home however has not tried taking it as she does not want to take "too many medications." Because of the  nausea she has had decreased fluid intake over the last 48 hours.      ROS  All other systems are reviewed and are negative for acute change except as noted in the HPI.    Allergies  Allergen Reactions   Adhesive [Tape] Dermatitis   Statins Other (See Comments)    Myalgia, itching     Past Medical History:  Diagnosis Date   Abscess    hydroadenitis superativa   Anxiety    DM (diabetes mellitus) (HCC)     Hyperinsulinemia    Hypertension 1983   gestational - pre-eclampsia   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[facility])    Neuroma    audiotry neuroma   Seizure (HCC)    Vertigo      Past Surgical History:  Procedure Laterality Date   ABDOMINAL HYSTERECTOMY  2006   remaining left ovary   BRONCHIAL BIOPSY  02/19/2022   Procedure: BRONCHIAL BIOPSIES;  Surgeon: Josephine Igo, DO;  Location: [facility];  Service: Pulmonary;;   BRONCHIAL BRUSHINGS  02/19/2022   Procedure: BRONCHIAL BRUSHINGS;  Surgeon: Josephine Igo, DO;  Location: [facility];  Service: Pulmonary;;   BRONCHIAL NEEDLE ASPIRATION BIOPSY  02/19/2022   Procedure: BRONCHIAL NEEDLE ASPIRATION BIOPSIES;  Surgeon: Josephine Igo, DO;  Location: [facility];  Service: Pulmonary;;   IR IMAGING GUIDED PORT INSERTION  03/29/2022   SHOULDER SURGERY Right 1960s    Social History   Socioeconomic History   Marital status: Divorced    Spouse name: Not on file   Number of children: Not on file   Years of education: Not on file   Highest education level: Not on file  Occupational History   Not on file  Tobacco Use   Smoking status: Former    Packs/day: .75    Types: Cigarettes    Quit date: 02/11/2022    Years since quitting: 0.4   Smokeless tobacco: Never  Vaping Use   Vaping Use: Never used  Substance and Sexual Activity   Alcohol use: Yes    Comment: very rare   Drug use: No   Sexual activity: Not on file  Other Topics Concern   Not on file  Social History Narrative   Not on file   Social Determinants of Health   Financial Resource Strain: Not on file  Food Insecurity: No Food Insecurity (07/26/2022)   Hunger Vital Sign    Worried About Running Out of Food in the Last Year: Never true    Ran Out of Food in the Last Year: Never true  Transportation Needs: No Transportation Needs (07/26/2022)   PRAPARE - Administrator, Civil Service (Medical): No    Lack of Transportation (Non-Medical): No  Physical  Activity: Not on file  Stress: Not on file  Social Connections: Not on file  Intimate Partner Violence: Not At Risk (07/26/2022)   Humiliation, Afraid, Rape, and Kick questionnaire    Fear of Current or Ex-Partner: No    Emotionally Abused: No    Physically Abused: No    Sexually Abused: No    Family History  Problem Relation Age of Onset   COPD Mother    Heart disease Father    Diabetes Father    Kidney disease Father    Arthritis Father        RA   Liver disease Father      Current Outpatient Medications:    [START ON 08/07/2022] potassium chloride SA (KLOR-CON M) 20 MEQ  tablet, Take 1 tablet (20 mEq total) by mouth 2 (two) times daily for 7 days., Disp: 14 tablet, Rfl: 0   albuterol (VENTOLIN HFA) 108 (90 Base) MCG/ACT inhaler, Inhale 2 puffs into the lungs every 6 (six) hours as needed., Disp: 18 g, Rfl: 2   amoxicillin-clavulanate (AUGMENTIN) 875-125 MG tablet, Take 1 tablet by mouth 2 (two) times daily for 11 days., Disp: 22 tablet, Rfl: 0   clonazePAM (KLONOPIN) 0.5 MG tablet, Take 1 tablet (0.5 mg total) by mouth at bedtime., Disp: , Rfl:    fluconazole (DIFLUCAN) 150 MG tablet, Take 1 tablet (150 mg total) by mouth every three (3) days as needed (yeast infection)., Disp: 3 tablet, Rfl: 0   folic acid (FOLVITE) 1 MG tablet, Take 1 tablet (1 mg total) by mouth daily., Disp: 30 tablet, Rfl: 1   furosemide (LASIX) 20 MG tablet, Take 1 tablet (20 mg total) by mouth daily as needed., Disp: 10 tablet, Rfl: 0   HYDROcodone-acetaminophen (NORCO) 5-325 MG tablet, Take 1 - 2 tablets by mouth every 6 (six) hours as needed for moderate pain., Disp: 30 tablet, Rfl: 0   insulin glargine (LANTUS SOLOSTAR) 100 UNIT/ML Solostar Pen, Inject 8 Units into the skin daily. If BS over 200 increase by 2 units, Disp: , Rfl:    Insulin Pen Needle (PEN NEEDLES) 32G X 4 MM MISC, Use to inject insulin daily as directed., Disp: 100 each, Rfl: 11   levETIRAcetam (KEPPRA) 1000 MG tablet, Take 1 tablet (1,000  mg total) by mouth 2 (two) times daily., Disp: 60 tablet, Rfl: 2   lidocaine (XYLOCAINE) 5 % ointment, Apply topically as needed., Disp: 60 g, Rfl: 1   lidocaine-prilocaine (EMLA) cream, Apply to port prior to access, Disp: 30 g, Rfl: 0   magic mouthwash (nystatin, diphenhydrAMINE, alum & mag hydroxide) suspension mixture, Take 5 mLs by mouth 3 (three) times daily as needed for mouth pain., Disp: 240 mL, Rfl: 1   metFORMIN (GLUCOPHAGE) 1000 MG tablet, Take 1 tablet (1,000 mg total) by mouth 2 (two) times daily., Disp: 180 tablet, Rfl: 1   ondansetron (ZOFRAN) 4 MG tablet, Take 1 tablet (4 mg total) by mouth every 6 (six) hours as needed for nausea. (Patient taking differently: Take 4-8 mg by mouth every 6 (six) hours as needed for nausea.), Disp: 30 tablet, Rfl: 2   prochlorperazine (COMPAZINE) 10 MG tablet, Take 1 tablet (10 mg total) by mouth every 6 (six) hours as needed., Disp: 30 tablet, Rfl: 2   saccharomyces boulardii (FLORASTOR) 250 MG capsule, Take 1 capsule (250 mg total) by mouth 2 (two) times daily for 15 days., Disp: 30 capsule, Rfl: 0   triamcinolone ointment (KENALOG) 0.5 %, Apply topically to affected area twice daily as directed,, Disp: 30 g, Rfl: 0  PHYSICAL EXAM: ECOG FS:2 - Symptomatic, <50% confined to bed    Vitals:   08/06/22 1036 08/06/22 1306  BP: 121/75 117/65  Pulse: 80 85  Resp: 20 18  Temp: 98.6 F (37 C)   TempSrc: Oral   SpO2: 99% 99%   Physical Exam Vitals and nursing note reviewed.  Constitutional:      Appearance: She is not ill-appearing or toxic-appearing.  HENT:     Head: Normocephalic.     Mouth/Throat:     Mouth: Mucous membranes are dry.  Eyes:     Conjunctiva/sclera: Conjunctivae normal.  Cardiovascular:     Rate and Rhythm: Normal rate and regular rhythm.     Pulses: Normal  pulses.     Heart sounds: Normal heart sounds.  Pulmonary:     Effort: Pulmonary effort is normal.     Breath sounds: Normal breath sounds.  Chest:     Comments:  PAC in right upper chest without surrounding signs of infection. No tenderness. Abdominal:     General: Bowel sounds are normal. There is no distension.     Palpations: Abdomen is soft. There is no mass.     Tenderness: There is no abdominal tenderness. There is no guarding or rebound.     Hernia: No hernia is present.  Musculoskeletal:     Cervical back: Normal range of motion.  Skin:    General: Skin is warm and dry.  Neurological:     Mental Status: She is alert.        LABORATORY DATA: I have reviewed the data as listed    Latest Ref Rng & Units 08/06/2022    9:58 AM 07/29/2022    4:48 AM 07/28/2022    8:57 AM  CBC  WBC 4.0 - 10.5 K/uL 8.2  1.9  4.5   Hemoglobin 12.0 - 15.0 g/dL 60.4  9.4  9.6   Hematocrit 36.0 - 46.0 % 32.2  29.1  29.9   Platelets 150 - 400 K/uL 139  89  103         Latest Ref Rng & Units 08/06/2022    9:58 AM 07/29/2022    4:48 AM 07/28/2022    8:57 AM  CMP  Glucose 70 - 99 mg/dL 540  981  191   BUN 8 - 23 mg/dL 7  12  8    Creatinine 0.44 - 1.00 mg/dL 4.78  2.95  6.21   Sodium 135 - 145 mmol/L 137  135  137   Potassium 3.5 - 5.1 mmol/L 3.1  3.4  4.1   Chloride 98 - 111 mmol/L 95  102  102   CO2 22 - 32 mmol/L 30  25  26    Calcium 8.9 - 10.3 mg/dL 8.6  8.1  7.9   Total Protein 6.5 - 8.1 g/dL 6.6  5.5  5.5   Total Bilirubin 0.3 - 1.2 mg/dL 0.8  1.1  1.2   Alkaline Phos 38 - 126 U/L 66  45  47   AST 15 - 41 U/L 24  27  27    ALT 0 - 44 U/L 21  29  32        RADIOGRAPHIC STUDIES (from last 24 hours if applicable) I have personally reviewed the radiological images as listed and agreed with the findings in the report. No results found.      Visit Diagnosis: 1. Port-A-Cath in place   2. 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 Hypokalemia      No orders of the defined types were placed in this encounter.   All questions were answered. The patient knows to call the clinic with any problems, questions or concerns. No  barriers to learning was detected.  A total of more than 30 minutes were spent on this encounter with face-to-face time and non-face-to-face time, including preparing to see the patient, ordering tests and/or medications, counseling the patient and coordination of care as outlined above.    Thank you for allowing me to participate in the care of this patient.    Shanon Ace, PA-C Department of Hematology/Oncology [facility] Phone: [phone]  [phone]  08/06/2022 10:23 PM

## 2022-08-06 ENCOUNTER — Inpatient Hospital Stay: Payer: BC Managed Care – PPO

## 2022-08-06 ENCOUNTER — Other Ambulatory Visit (HOSPITAL_COMMUNITY): Payer: Self-pay

## 2022-08-06 ENCOUNTER — Telehealth: Payer: Self-pay

## 2022-08-06 ENCOUNTER — Other Ambulatory Visit: Payer: Self-pay | Admitting: Physician Assistant

## 2022-08-06 ENCOUNTER — Inpatient Hospital Stay (HOSPITAL_BASED_OUTPATIENT_CLINIC_OR_DEPARTMENT_OTHER): Payer: BC Managed Care – PPO | Admitting: Physician Assistant

## 2022-08-06 VITALS — BP 117/65 | HR 85 | Temp 98.6°F | Resp 18

## 2022-08-06 DIAGNOSIS — Z5111 Encounter for antineoplastic chemotherapy: Secondary | ICD-10-CM | POA: Diagnosis not present

## 2022-08-06 DIAGNOSIS — C3490 Malignant neoplasm of unspecified part of unspecified bronchus or lung: Secondary | ICD-10-CM

## 2022-08-06 DIAGNOSIS — Z79899 Other long term (current) drug therapy: Secondary | ICD-10-CM | POA: Diagnosis not present

## 2022-08-06 DIAGNOSIS — Z95828 Presence of other vascular implants and grafts: Secondary | ICD-10-CM

## 2022-08-06 DIAGNOSIS — E876 Hypokalemia: Secondary | ICD-10-CM | POA: Diagnosis not present

## 2022-08-06 DIAGNOSIS — Z5112 Encounter for antineoplastic immunotherapy: Secondary | ICD-10-CM | POA: Diagnosis not present

## 2022-08-06 DIAGNOSIS — C7931 Secondary malignant neoplasm of brain: Secondary | ICD-10-CM

## 2022-08-06 DIAGNOSIS — C3432 Malignant neoplasm of lower lobe, left bronchus or lung: Secondary | ICD-10-CM | POA: Diagnosis not present

## 2022-08-06 DIAGNOSIS — Z794 Long term (current) use of insulin: Secondary | ICD-10-CM | POA: Diagnosis not present

## 2022-08-06 DIAGNOSIS — C3492 Malignant neoplasm of unspecified part of left bronchus or lung: Secondary | ICD-10-CM

## 2022-08-06 DIAGNOSIS — Z7984 Long term (current) use of oral hypoglycemic drugs: Secondary | ICD-10-CM | POA: Diagnosis not present

## 2022-08-06 DIAGNOSIS — R197 Diarrhea, unspecified: Secondary | ICD-10-CM

## 2022-08-06 DIAGNOSIS — Z87891 Personal history of nicotine dependence: Secondary | ICD-10-CM | POA: Diagnosis not present

## 2022-08-06 LAB — CMP (CANCER CENTER ONLY)
ALT: 21 U/L (ref 0–44)
AST: 24 U/L (ref 15–41)
Albumin: 3.4 g/dL — ABNORMAL LOW (ref 3.5–5.0)
Alkaline Phosphatase: 66 U/L (ref 38–126)
Anion gap: 12 (ref 5–15)
BUN: 7 mg/dL — ABNORMAL LOW (ref 8–23)
CO2: 30 mmol/L (ref 22–32)
Calcium: 8.6 mg/dL — ABNORMAL LOW (ref 8.9–10.3)
Chloride: 95 mmol/L — ABNORMAL LOW (ref 98–111)
Creatinine: 0.9 mg/dL (ref 0.44–1.00)
GFR, Estimated: >60 mL/min (ref >60)
Glucose, Bld: 155 mg/dL — ABNORMAL HIGH (ref 70–99)
Potassium: 3.1 mmol/L — ABNORMAL LOW (ref 3.5–5.1)
Sodium: 137 mmol/L (ref 135–145)
Total Bilirubin: 0.8 mg/dL (ref 0.3–1.2)
Total Protein: 6.6 g/dL (ref 6.5–8.1)

## 2022-08-06 LAB — CBC WITH DIFFERENTIAL (CANCER CENTER ONLY)
Abs Immature Granulocytes: 0.08 10*3/uL — ABNORMAL HIGH (ref 0.00–0.07)
Basophils Absolute: 0 10*3/uL (ref 0.0–0.1)
Basophils Relative: 0 %
Eosinophils Absolute: 1 10*3/uL — ABNORMAL HIGH (ref 0.0–0.5)
Eosinophils Relative: 12 %
HCT: 32.2 % — ABNORMAL LOW (ref 36.0–46.0)
Hemoglobin: 10.8 g/dL — ABNORMAL LOW (ref 12.0–15.0)
Immature Granulocytes: 1 %
Lymphocytes Relative: 14 %
Lymphs Abs: 1.1 10*3/uL (ref 0.7–4.0)
MCH: 32.9 pg (ref 26.0–34.0)
MCHC: 33.5 g/dL (ref 30.0–36.0)
MCV: 98.2 fL (ref 80.0–100.0)
Monocytes Absolute: 1 10*3/uL (ref 0.1–1.0)
Monocytes Relative: 13 %
Neutro Abs: 5 10*3/uL (ref 1.7–7.7)
Neutrophils Relative %: 60 %
Platelet Count: 139 10*3/uL — ABNORMAL LOW (ref 150–400)
RBC: 3.28 MIL/uL — ABNORMAL LOW (ref 3.87–5.11)
RDW: 15.9 % — ABNORMAL HIGH (ref 11.5–15.5)
WBC Count: 8.2 10*3/uL (ref 4.0–10.5)
nRBC: 0 % (ref 0.0–0.2)

## 2022-08-06 MED ORDER — HEPARIN SOD (PORK) LOCK FLUSH 100 UNIT/ML IV SOLN
500.0000 [IU] | Freq: Once | INTRAVENOUS | Status: AC
  Administered 2022-08-06: 500 [IU]

## 2022-08-06 MED ORDER — POTASSIUM CHLORIDE 10 MEQ/100ML IV SOLN
10.0000 meq | Freq: Once | INTRAVENOUS | Status: AC
  Administered 2022-08-06: 10 meq via INTRAVENOUS
  Filled 2022-08-06: qty 100

## 2022-08-06 MED ORDER — ALTEPLASE 2 MG IJ SOLR
2.0000 mg | Freq: Once | INTRAMUSCULAR | Status: AC
  Administered 2022-08-06: 2 mg
  Filled 2022-08-06: qty 2

## 2022-08-06 MED ORDER — ONDANSETRON HCL 4 MG/2ML IJ SOLN
4.0000 mg | Freq: Once | INTRAMUSCULAR | Status: AC
  Administered 2022-08-06: 4 mg via INTRAVENOUS
  Filled 2022-08-06: qty 2

## 2022-08-06 MED ORDER — SODIUM CHLORIDE 0.9% FLUSH
10.0000 mL | Freq: Once | INTRAVENOUS | Status: AC
  Administered 2022-08-06: 10 mL

## 2022-08-06 MED ORDER — POTASSIUM CHLORIDE CRYS ER 20 MEQ PO TBCR
20.0000 meq | EXTENDED_RELEASE_TABLET | Freq: Two times a day (BID) | ORAL | 0 refills | Status: DC
  Filled 2022-08-06: qty 14, 7d supply, fill #0

## 2022-08-06 MED ORDER — SODIUM CHLORIDE 0.9 % IV SOLN: Freq: Once | INTRAVENOUS | Status: AC

## 2022-08-06 NOTE — Transitions of Care (Post Inpatient/ED Visit) (Signed)
   08/06/2022  Name: Carolyn Hooper MRN: [id number] DOB: [birth year]  Today's TOC FU Call Status: Today's TOC FU Call Status:: Unsuccessful Call (3rd Attempt) Unsuccessful Call (3rd Attempt) Date: 08/06/22  Attempted to reach the patient regarding the most recent Inpatient/ED visit.  Follow Up Plan: No further outreach attempts will be made at this time. We have been unable to contact the patient.  Jodelle Gross, RN, BSN, CCM Care Management Coordinator [place] Phone: [phone]/Fax: [phone]

## 2022-08-07 ENCOUNTER — Emergency Department (HOSPITAL_COMMUNITY): Payer: BC Managed Care – PPO

## 2022-08-07 ENCOUNTER — Telehealth: Payer: Self-pay | Admitting: Medical Oncology

## 2022-08-07 ENCOUNTER — Emergency Department (HOSPITAL_COMMUNITY)
Admission: EM | Admit: 2022-08-07 | Discharge: 2022-08-07 | Disposition: A | Discharge status: Home / Self Care | Payer: BC Managed Care – PPO | Attending: Emergency Medicine | Admitting: Emergency Medicine

## 2022-08-07 ENCOUNTER — Encounter (HOSPITAL_COMMUNITY): Payer: Self-pay | Admitting: *Deleted

## 2022-08-07 ENCOUNTER — Other Ambulatory Visit: Payer: Self-pay

## 2022-08-07 ENCOUNTER — Other Ambulatory Visit: Payer: BC Managed Care – PPO

## 2022-08-07 ENCOUNTER — Ambulatory Visit: Payer: BC Managed Care – PPO | Admitting: Physician Assistant

## 2022-08-07 ENCOUNTER — Ambulatory Visit: Payer: BC Managed Care – PPO

## 2022-08-07 DIAGNOSIS — R110 Nausea: Secondary | ICD-10-CM | POA: Diagnosis not present

## 2022-08-07 DIAGNOSIS — C7931 Secondary malignant neoplasm of brain: Secondary | ICD-10-CM | POA: Diagnosis not present

## 2022-08-07 DIAGNOSIS — I10 Essential (primary) hypertension: Secondary | ICD-10-CM | POA: Insufficient documentation

## 2022-08-07 DIAGNOSIS — G936 Cerebral edema: Secondary | ICD-10-CM | POA: Diagnosis not present

## 2022-08-07 DIAGNOSIS — R4182 Altered mental status, unspecified: Secondary | ICD-10-CM | POA: Diagnosis not present

## 2022-08-07 DIAGNOSIS — R5381 Other malaise: Secondary | ICD-10-CM | POA: Diagnosis not present

## 2022-08-07 DIAGNOSIS — E099 Drug or chemical induced diabetes mellitus without complications: Secondary | ICD-10-CM | POA: Insufficient documentation

## 2022-08-07 DIAGNOSIS — Z7984 Long term (current) use of oral hypoglycemic drugs: Secondary | ICD-10-CM | POA: Insufficient documentation

## 2022-08-07 DIAGNOSIS — R4781 Slurred speech: Secondary | ICD-10-CM | POA: Diagnosis not present

## 2022-08-07 LAB — URINALYSIS, ROUTINE W REFLEX MICROSCOPIC
Bacteria, UA: NONE SEEN
Bilirubin Urine: NEGATIVE
Glucose, UA: NEGATIVE mg/dL
Hgb urine dipstick: NEGATIVE
Ketones, ur: 5 mg/dL — AB
Nitrite: NEGATIVE
Protein, ur: 30 mg/dL — AB
Specific Gravity, Urine: 1.008 (ref 1.005–1.030)
pH: 6 (ref 5.0–8.0)

## 2022-08-07 LAB — COMPREHENSIVE METABOLIC PANEL
ALT: 21 U/L (ref 0–44)
AST: 26 U/L (ref 15–41)
Albumin: 2.8 g/dL — ABNORMAL LOW (ref 3.5–5.0)
Alkaline Phosphatase: 62 U/L (ref 38–126)
Anion gap: 12 (ref 5–15)
BUN: 8 mg/dL (ref 8–23)
CO2: 29 mmol/L (ref 22–32)
Calcium: 8.5 mg/dL — ABNORMAL LOW (ref 8.9–10.3)
Chloride: 96 mmol/L — ABNORMAL LOW (ref 98–111)
Creatinine, Ser: 0.84 mg/dL (ref 0.44–1.00)
GFR, Estimated: >60 mL/min (ref >60)
Glucose, Bld: 154 mg/dL — ABNORMAL HIGH (ref 70–99)
Potassium: 3.9 mmol/L (ref 3.5–5.1)
Sodium: 137 mmol/L (ref 135–145)
Total Bilirubin: 0.9 mg/dL (ref 0.3–1.2)
Total Protein: 6.2 g/dL — ABNORMAL LOW (ref 6.5–8.1)

## 2022-08-07 LAB — CBC WITH DIFFERENTIAL/PLATELET
Abs Immature Granulocytes: 0.03 10*3/uL (ref 0.00–0.07)
Basophils Absolute: 0 10*3/uL (ref 0.0–0.1)
Basophils Relative: 0 %
Eosinophils Absolute: 0.8 10*3/uL — ABNORMAL HIGH (ref 0.0–0.5)
Eosinophils Relative: 11 %
HCT: 32.8 % — ABNORMAL LOW (ref 36.0–46.0)
Hemoglobin: 10.3 g/dL — ABNORMAL LOW (ref 12.0–15.0)
Immature Granulocytes: 0 %
Lymphocytes Relative: 15 %
Lymphs Abs: 1.1 10*3/uL (ref 0.7–4.0)
MCH: 32.2 pg (ref 26.0–34.0)
MCHC: 31.4 g/dL (ref 30.0–36.0)
MCV: 102.5 fL — ABNORMAL HIGH (ref 80.0–100.0)
Monocytes Absolute: 0.9 10*3/uL (ref 0.1–1.0)
Monocytes Relative: 13 %
Neutro Abs: 4.3 10*3/uL (ref 1.7–7.7)
Neutrophils Relative %: 61 %
Platelets: 179 10*3/uL (ref 150–400)
RBC: 3.2 MIL/uL — ABNORMAL LOW (ref 3.87–5.11)
RDW: 16.4 % — ABNORMAL HIGH (ref 11.5–15.5)
WBC: 7.1 10*3/uL (ref 4.0–10.5)
nRBC: 0 % (ref 0.0–0.2)

## 2022-08-07 LAB — MAGNESIUM: Magnesium: 1.4 mg/dL — ABNORMAL LOW (ref 1.7–2.4)

## 2022-08-07 LAB — CBG MONITORING, ED
Glucose-Capillary: 172 mg/dL — ABNORMAL HIGH (ref 70–99)
Glucose-Capillary: 117 mg/dL — ABNORMAL HIGH (ref 70–99)

## 2022-08-07 MED ORDER — DEXAMETHASONE 4 MG PO TABS
4.0000 mg | ORAL_TABLET | Freq: Two times a day (BID) | ORAL | 0 refills | Status: DC
  Filled 2022-08-07: qty 30, 15d supply, fill #0

## 2022-08-07 MED ORDER — ALPRAZOLAM 0.5 MG PO TABS
0.2500 mg | ORAL_TABLET | Freq: Once | ORAL | Status: DC | PRN
  Filled 2022-08-07: qty 1

## 2022-08-07 MED ORDER — ONDANSETRON 8 MG PO TBDP
8.0000 mg | ORAL_TABLET | Freq: Once | ORAL | Status: AC
  Administered 2022-08-07: 8 mg via ORAL
  Filled 2022-08-07: qty 1

## 2022-08-07 MED ORDER — LORAZEPAM 1 MG PO TABS: 1.0000 mg | ORAL_TABLET | Freq: Once | ORAL | Status: DC | PRN

## 2022-08-07 MED ORDER — SODIUM CHLORIDE 0.9 % IV BOLUS
500.0000 mL | Freq: Once | INTRAVENOUS | Status: AC
  Administered 2022-08-07: 500 mL via INTRAVENOUS

## 2022-08-07 MED ORDER — ALPRAZOLAM 0.5 MG PO TABS
0.2500 mg | ORAL_TABLET | Freq: Once | ORAL | Status: AC
  Administered 2022-08-07: 0.25 mg via ORAL
  Filled 2022-08-07: qty 1

## 2022-08-07 MED ORDER — GADOBUTROL 1 MMOL/ML IV SOLN
10.0000 mL | Freq: Once | INTRAVENOUS | Status: AC | PRN
  Administered 2022-08-07: 10 mL via INTRAVENOUS

## 2022-08-07 MED ORDER — DEXAMETHASONE SODIUM PHOSPHATE 10 MG/ML IJ SOLN
10.0000 mg | Freq: Once | INTRAMUSCULAR | Status: AC
  Administered 2022-08-07: 10 mg via INTRAVENOUS
  Filled 2022-08-07: qty 1

## 2022-08-07 NOTE — ED Provider Notes (Signed)
[place] EMERGENCY DEPARTMENT AT [facility] Provider Note   CSN: [id number] Arrival date & time: 08/07/22  1453     History  Chief Complaint  Patient presents with   Altered Mental Status    GREG CAPPA is a 62 y.o. female.  HPI    62 y.o. female, with past medical history significant for stage IV lung cancer with brain mets on ? Decadron, steroid-induced diabetes, obesity, hypertension, hyperlipidemia comes in with cc of speech disturbance. Pt states that she is having difficulty articulating herself, worse than her baseline. She also has pause in her speech, but that is normal. No headaches. She is slow in completing simple motor task. Neuro-onc clinic advised her to come to the er.  Home Medications Prior to Admission medications   Medication Sig Start Date End Date Taking? Authorizing Provider  dexamethasone (DECADRON) 4 MG tablet Take 1 tablet (4 mg total) by mouth 2 (two) times daily with a meal. 08/07/22  Yes Finnigan Warriner, MD  albuterol (VENTOLIN HFA) 108 (90 Base) MCG/ACT inhaler Inhale 2 puffs into the lungs every 6 (six) hours as needed. 01/08/21   [name], FNP  amoxicillin-clavulanate (AUGMENTIN) 875-125 MG tablet Take 1 tablet by mouth 2 (two) times daily for 11 days. 07/30/22 08/10/22  Johnson, Clanford L, MD  clonazePAM (KLONOPIN) 0.5 MG tablet Take 1 tablet (0.5 mg total) by mouth at bedtime. 07/30/22   Johnson, Clanford L, MD  fluconazole (DIFLUCAN) 150 MG tablet Take 1 tablet (150 mg total) by mouth every three (3) days as needed (yeast infection). 07/30/22   Johnson, Clanford L, MD  folic acid (FOLVITE) 1 MG tablet Take 1 tablet (1 mg total) by mouth daily. 05/26/22   [name], PA-C  furosemide (LASIX) 20 MG tablet Take 1 tablet (20 mg total) by mouth daily as needed. 06/20/22   Heilingoetter, [name], PA-C  HYDROcodone-acetaminophen (NORCO) 5-325 MG tablet Take 1 - 2 tablets by mouth every 6 (six) hours as needed for moderate pain.  05/30/22   [name], FNP  insulin glargine (LANTUS SOLOSTAR) 100 UNIT/ML Solostar Pen Inject 8 Units into the skin daily. If BS over 200 increase by 2 units 07/30/22   Johnson, Clanford L, MD  Insulin Pen Needle (PEN NEEDLES) 32G X 4 MM MISC Use to inject insulin daily as directed. 07/09/22   [name], FNP  levETIRAcetam (KEPPRA) 1000 MG tablet Take 1 tablet (1,000 mg total) by mouth 2 (two) times daily. 07/31/22   [name], MD  lidocaine (XYLOCAINE) 5 % ointment Apply topically as needed. 05/30/22   [name], FNP  lidocaine-prilocaine (EMLA) cream Apply to port prior to access 04/25/22   [name], MD  magic mouthwash (nystatin, diphenhydrAMINE, alum & mag hydroxide) suspension mixture Take 5 mLs by mouth 3 (three) times daily as needed for mouth pain. 07/22/22   Heilingoetter, [name], PA-C  metFORMIN (GLUCOPHAGE) 1000 MG tablet Take 1 tablet (1,000 mg total) by mouth 2 (two) times daily. 05/10/22   [name], FNP  ondansetron (ZOFRAN) 4 MG tablet Take 1 tablet (4 mg total) by mouth every 6 (six) hours as needed for nausea. Patient taking differently: Take 4-8 mg by mouth every 6 (six) hours as needed for nausea. 07/17/22   [name], [name], MD  potassium chloride SA (KLOR-CON M) 20 MEQ tablet Take 1 tablet (20 mEq total) by mouth 2 (two) times daily for 7 days. 08/07/22 08/14/22  Namon Cirri  E, PA-C  prochlorperazine (COMPAZINE) 10 MG tablet Take 1 tablet (10 mg total) by mouth every 6 (six) hours as needed. 05/30/22   Heilingoetter, [name], PA-C  saccharomyces boulardii (FLORASTOR) 250 MG capsule Take 1 capsule (250 mg total) by mouth 2 (two) times daily for 15 days. 07/30/22 08/14/22  [name], MD  triamcinolone ointment (KENALOG) 0.5 % Apply topically to affected area twice daily as directed, 07/30/22   [name], MD      Allergies    Adhesive [tape] and Statins    Review of Systems   Review of Systems  All other systems  reviewed and are negative.   Physical Exam Updated Vital Signs BP 131/67   Pulse 73   Temp 97.7 F (36.5 C) (Oral)   Resp 14   Ht 5\' 8"  (1.727 m)   Wt 110 kg   SpO2 100%   BMI 36.87 kg/m  Physical Exam Vitals and nursing note reviewed.  Constitutional:      Appearance: She is well-developed.  HENT:     Head: Atraumatic.  Eyes:     Extraocular Movements: Extraocular movements intact.     Pupils: Pupils are equal, round, and reactive to light.  Cardiovascular:     Rate and Rhythm: Normal rate.  Pulmonary:     Effort: Pulmonary effort is normal.  Musculoskeletal:     Cervical back: Normal range of motion and neck supple.  Skin:    General: Skin is warm and dry.  Neurological:     Mental Status: She is alert and oriented to person, place, and time.     Cranial Nerves: No cranial nerve deficit.     Sensory: No sensory deficit.     Motor: No weakness.     Comments: Pt's words are paused out and she indicates difficulty in expressing certain phrases/words     ED Results / Procedures / Treatments   Labs (all labs ordered are listed, but only abnormal results are displayed) Labs Reviewed  COMPREHENSIVE METABOLIC PANEL - Abnormal; Notable for the following components:      Result Value   Chloride 96 (*)    Glucose, Bld 154 (*)    Calcium 8.5 (*)    Total Protein 6.2 (*)    Albumin 2.8 (*)    All other components within normal limits  CBC WITH DIFFERENTIAL/PLATELET - Abnormal; Notable for the following components:   RBC 3.20 (*)    Hemoglobin 10.3 (*)    HCT 32.8 (*)    MCV 102.5 (*)    RDW 16.4 (*)    Eosinophils Absolute 0.8 (*)    All other components within normal limits  URINALYSIS, ROUTINE W REFLEX MICROSCOPIC - Abnormal; Notable for the following components:   APPearance CLOUDY (*)    Ketones, ur 5 (*)    Protein, ur 30 (*)    Leukocytes,Ua LARGE (*)    Non Squamous Epithelial 0-5 (*)    All other components within normal limits  MAGNESIUM - Abnormal;  Notable for the following components:   Magnesium 1.4 (*)    All other components within normal limits  CBG MONITORING, ED - Abnormal; Notable for the following components:   Glucose-Capillary 172 (*)    All other components within normal limits  CBG MONITORING, ED - Abnormal; Notable for the following components:   Glucose-Capillary 117 (*)    All other components within normal limits    EKG None  Radiology CT Head Wo Contrast  Result Date: 08/07/2022 CLINICAL DATA:  Difficulty speaking history of lung cancer EXAM: CT HEAD WITHOUT CONTRAST TECHNIQUE: Contiguous axial images were obtained from the base of the skull through the vertex without intravenous contrast. RADIATION DOSE REDUCTION: This exam was performed according to the departmental dose-optimization program which includes automated exposure control, adjustment of the mA and/or kV according to patient size and/or use of iterative reconstruction technique. COMPARISON:  Brain MRI 06/06/2022, head CT 07/16/2022 FINDINGS: Brain: No hemorrhage is visualized. Slight increased edema within the left frontal white matter corresponding to history of known metastatic lesion. No significant mass effect or midline shift. Smaller MRI demonstrated lesion near the right vertex is not well seen on CT. Stable ventricle size Vascular: No hyperdense vessels.  Carotid vascular calcification Skull: Normal. Negative for fracture or focal lesion. Sinuses/Orbits: No acute finding.  Patchy mucosal thickening Other: None IMPRESSION: 1. Negative for acute intracranial hemorrhage. 2. Slight increased edema/hypodensity within the left frontal white since head CT 07/16/2022 matter corresponding to history of known metastatic disease. No significant mass effect or midline shift. Electronically Signed   By: Jasmine Pang M.D.   On: 08/07/2022 16:59    Procedures .Critical Care  Performed by: Derwood Kaplan, MD Authorized by: Derwood Kaplan, MD   Critical care  provider statement:    Critical care time (minutes):  32   Critical care was necessary to treat or prevent imminent or life-threatening deterioration of the following conditions:  CNS failure or compromise (Vasogenic edema, requiring IV dexamethasone)   Critical care was time spent personally by me on the following activities:  Development of treatment plan with patient or surrogate, discussions with consultants, evaluation of patient's response to treatment, examination of patient, ordering and review of laboratory studies, ordering and review of radiographic studies, ordering and performing treatments and interventions, pulse oximetry, re-evaluation of patient's condition and review of old charts Comments:     Patient received IV dexamethasone.  She has vasogenic edema. Discussed at length initiation of dexamethasone should help, return precautions discussed.  Consulted neuro-oncology to get their recommendations as well.     Medications Ordered in ED Medications  ALPRAZolam (XANAX) tablet 0.25 mg (has no administration in time range)  dexamethasone (DECADRON) injection 10 mg (has no administration in time range)  ondansetron (ZOFRAN-ODT) disintegrating tablet 8 mg (8 mg Oral Given 08/07/22 1629)  sodium chloride 0.9 % bolus 500 mL (0 mLs Intravenous Stopped 08/07/22 1826)  ondansetron (ZOFRAN-ODT) disintegrating tablet 8 mg (8 mg Oral Given 08/07/22 1729)  ALPRAZolam (XANAX) tablet 0.25 mg (0.25 mg Oral Given 08/07/22 1729)  gadobutrol (GADAVIST) 1 MMOL/ML injection 10 mL (10 mLs Intravenous Contrast Given 08/07/22 1800)    ED Course/ Medical Decision Making/ A&P                             Medical Decision Making Amount and/or Complexity of Data Reviewed Labs: ordered. Radiology: ordered.  Risk Prescription drug management.   This patient presents to the ED with chief complaint(s) of difficulty in expressing with pertinent past medical history of metastatic lung cancer with mets to  the brain.The complaint involves an extensive differential diagnosis and also carries with it a high risk of complications and morbidity.    The differential diagnosis includes : Vasogenic edema, worsening cancer, subarachnoid hemorrhage, ischemic stroke, severe electrolyte abnormality, somatoform disorder  The initial plan is to get basic labs, MRI brain with and without contrast has been ordered.  Additional history obtained: Records reviewed previous admission documents and previous MRI brain with and without contrast reviewed.  Independent labs interpretation:  The following labs were independently interpreted: labs reassuring  Independent visualization and interpretation of imaging: - I independently visualized the following imaging with scope of interpretation limited to determining acute life threatening conditions related to emergency care: CT head, which revealed no evidence of brain bleed  Treatment and Reassessment: Reassess post mri and discussion with dr Barbaraann Cao.  Consultation: - Consulted or discussed management/test interpretation with external professional: dr Barbaraann Cao, neuro onc was consulted after the MRI.  MRI reveals slightly worsening edema, no new bleeding or metastases.  Dr. Barbaraann Cao indicates that we load patient up with Decadron and discharged with oral twice daily 4 mg Decadron.  They will see the patient in the clinic.   Final Clinical Impression(s) / ED Diagnoses Final diagnoses:  Vasogenic brain edema (HCC)    Rx / DC Orders ED Discharge Orders          Ordered    dexamethasone (DECADRON) 4 MG tablet  2 times daily with meals        08/07/22 1852              Derwood Kaplan, MD 08/07/22 1909

## 2022-08-07 NOTE — Discharge Instructions (Signed)
The MRI of the brain is overall reassuring.  There is slightly increased swelling -and we discussed the case with Dr. Barbaraann Cao, who recommends that you start taking dexamethasone twice a day and see him in the clinic.  Please call the number provided to ensure that you are seen within 2 weeks.  Return to the emergency room if you start having worsening symptoms.

## 2022-08-07 NOTE — ED Triage Notes (Signed)
Pt BIB [facility] for c/o trouble speaking and not being able to get her words out (per patient) for the last 2 days  Pt has lung cancer that has metastasized to her brain; pt last received chemo x 3 weeks ago

## 2022-08-07 NOTE — Telephone Encounter (Signed)
Carolyn Hooper  is struggling to find words and finish a sentence. I can hear that her speech is worse and she cannot find simple words. She is not sure she used her insulin pen correctly today. It took her 30 mins to get a blood specimen. MRI brain is expected 06/20. Per Dr. Barbaraann Cao.  I called  EMS to take Carolyn Hooper  to ED.    Carolyn Hooper notified to expect EMS.

## 2022-08-08 ENCOUNTER — Other Ambulatory Visit (HOSPITAL_COMMUNITY): Payer: Self-pay

## 2022-08-08 ENCOUNTER — Telehealth: Payer: Self-pay | Admitting: Family

## 2022-08-08 ENCOUNTER — Other Ambulatory Visit: Payer: Self-pay | Admitting: Family Medicine

## 2022-08-08 NOTE — Telephone Encounter (Signed)
Just wants advise she should she increase anything for her Dm since she will be going on steroid.

## 2022-08-08 NOTE — Telephone Encounter (Signed)
Sent this through her my chart per patient on phone call earlier she wanted reply sent to her mychart

## 2022-08-08 NOTE — Telephone Encounter (Signed)
She should increase Lantus to 10 units daily. If glucose >250 she can increase by 2 units every other day.   Jannifer Rodney, FNP

## 2022-08-09 ENCOUNTER — Other Ambulatory Visit: Payer: Self-pay | Admitting: Family

## 2022-08-09 ENCOUNTER — Other Ambulatory Visit (HOSPITAL_COMMUNITY): Payer: Self-pay

## 2022-08-09 DIAGNOSIS — Z79899 Other long term (current) drug therapy: Secondary | ICD-10-CM

## 2022-08-09 DIAGNOSIS — F1320 Sedative, hypnotic or anxiolytic dependence, uncomplicated: Secondary | ICD-10-CM

## 2022-08-09 DIAGNOSIS — F411 Generalized anxiety disorder: Secondary | ICD-10-CM

## 2022-08-09 DIAGNOSIS — E1165 Type 2 diabetes mellitus with hyperglycemia: Secondary | ICD-10-CM

## 2022-08-10 ENCOUNTER — Other Ambulatory Visit (HOSPITAL_COMMUNITY): Payer: Self-pay

## 2022-08-13 ENCOUNTER — Emergency Department (HOSPITAL_COMMUNITY)
Admission: EM | Admit: 2022-08-13 | Discharge: 2022-08-13 | Disposition: A | Discharge status: Home / Self Care | Payer: BC Managed Care – PPO | Attending: Emergency Medicine | Admitting: Emergency Medicine

## 2022-08-13 ENCOUNTER — Emergency Department (HOSPITAL_COMMUNITY): Payer: BC Managed Care – PPO

## 2022-08-13 ENCOUNTER — Encounter (HOSPITAL_COMMUNITY): Payer: Self-pay | Admitting: Radiology

## 2022-08-13 ENCOUNTER — Other Ambulatory Visit: Payer: Self-pay

## 2022-08-13 ENCOUNTER — Telehealth: Payer: Self-pay

## 2022-08-13 DIAGNOSIS — C3490 Malignant neoplasm of unspecified part of unspecified bronchus or lung: Secondary | ICD-10-CM | POA: Diagnosis not present

## 2022-08-13 DIAGNOSIS — S60512A Abrasion of left hand, initial encounter: Secondary | ICD-10-CM | POA: Insufficient documentation

## 2022-08-13 DIAGNOSIS — S0031XA Abrasion of nose, initial encounter: Secondary | ICD-10-CM | POA: Diagnosis not present

## 2022-08-13 DIAGNOSIS — I10 Essential (primary) hypertension: Secondary | ICD-10-CM | POA: Diagnosis not present

## 2022-08-13 DIAGNOSIS — R059 Cough, unspecified: Secondary | ICD-10-CM | POA: Diagnosis not present

## 2022-08-13 DIAGNOSIS — W1830XA Fall on same level, unspecified, initial encounter: Secondary | ICD-10-CM | POA: Diagnosis not present

## 2022-08-13 DIAGNOSIS — C7931 Secondary malignant neoplasm of brain: Secondary | ICD-10-CM | POA: Diagnosis not present

## 2022-08-13 DIAGNOSIS — R7401 Elevation of levels of liver transaminase levels: Secondary | ICD-10-CM | POA: Diagnosis not present

## 2022-08-13 DIAGNOSIS — T07XXXA Unspecified multiple injuries, initial encounter: Secondary | ICD-10-CM

## 2022-08-13 DIAGNOSIS — R58 Hemorrhage, not elsewhere classified: Secondary | ICD-10-CM | POA: Diagnosis not present

## 2022-08-13 DIAGNOSIS — S50312A Abrasion of left elbow, initial encounter: Secondary | ICD-10-CM | POA: Insufficient documentation

## 2022-08-13 DIAGNOSIS — M549 Dorsalgia, unspecified: Secondary | ICD-10-CM | POA: Diagnosis not present

## 2022-08-13 DIAGNOSIS — R4702 Dysphasia: Secondary | ICD-10-CM | POA: Diagnosis not present

## 2022-08-13 DIAGNOSIS — Z1152 Encounter for screening for COVID-19: Secondary | ICD-10-CM | POA: Insufficient documentation

## 2022-08-13 DIAGNOSIS — R739 Hyperglycemia, unspecified: Secondary | ICD-10-CM | POA: Insufficient documentation

## 2022-08-13 DIAGNOSIS — Z794 Long term (current) use of insulin: Secondary | ICD-10-CM | POA: Diagnosis not present

## 2022-08-13 DIAGNOSIS — M7989 Other specified soft tissue disorders: Secondary | ICD-10-CM | POA: Diagnosis not present

## 2022-08-13 DIAGNOSIS — S0993XA Unspecified injury of face, initial encounter: Secondary | ICD-10-CM | POA: Diagnosis not present

## 2022-08-13 DIAGNOSIS — M79642 Pain in left hand: Secondary | ICD-10-CM | POA: Diagnosis not present

## 2022-08-13 DIAGNOSIS — R609 Edema, unspecified: Secondary | ICD-10-CM | POA: Diagnosis not present

## 2022-08-13 LAB — CBC WITH DIFFERENTIAL/PLATELET
Abs Immature Granulocytes: 0.09 10*3/uL — ABNORMAL HIGH (ref 0.00–0.07)
Basophils Absolute: 0 10*3/uL (ref 0.0–0.1)
Basophils Relative: 0 %
Eosinophils Absolute: 0 10*3/uL (ref 0.0–0.5)
Eosinophils Relative: 0 %
HCT: 36.6 % (ref 36.0–46.0)
Hemoglobin: 11.7 g/dL — ABNORMAL LOW (ref 12.0–15.0)
Immature Granulocytes: 1 %
Lymphocytes Relative: 11 %
Lymphs Abs: 1 10*3/uL (ref 0.7–4.0)
MCH: 32.7 pg (ref 26.0–34.0)
MCHC: 32 g/dL (ref 30.0–36.0)
MCV: 102.2 fL — ABNORMAL HIGH (ref 80.0–100.0)
Monocytes Absolute: 0.9 10*3/uL (ref 0.1–1.0)
Monocytes Relative: 10 %
Neutro Abs: 7.2 10*3/uL (ref 1.7–7.7)
Neutrophils Relative %: 78 %
Platelets: 222 10*3/uL (ref 150–400)
RBC: 3.58 MIL/uL — ABNORMAL LOW (ref 3.87–5.11)
RDW: 18.5 % — ABNORMAL HIGH (ref 11.5–15.5)
WBC: 9.3 10*3/uL (ref 4.0–10.5)
nRBC: 0 % (ref 0.0–0.2)

## 2022-08-13 LAB — COMPREHENSIVE METABOLIC PANEL
ALT: 63 U/L — ABNORMAL HIGH (ref 0–44)
AST: 56 U/L — ABNORMAL HIGH (ref 15–41)
Albumin: 3.4 g/dL — ABNORMAL LOW (ref 3.5–5.0)
Alkaline Phosphatase: 51 U/L (ref 38–126)
Anion gap: 12 (ref 5–15)
BUN: 18 mg/dL (ref 8–23)
CO2: 24 mmol/L (ref 22–32)
Calcium: 9.4 mg/dL (ref 8.9–10.3)
Chloride: 101 mmol/L (ref 98–111)
Creatinine, Ser: 0.91 mg/dL (ref 0.44–1.00)
GFR, Estimated: >60 mL/min (ref >60)
Glucose, Bld: 215 mg/dL — ABNORMAL HIGH (ref 70–99)
Potassium: 4.2 mmol/L (ref 3.5–5.1)
Sodium: 137 mmol/L (ref 135–145)
Total Bilirubin: 0.7 mg/dL (ref 0.3–1.2)
Total Protein: 6.6 g/dL (ref 6.5–8.1)

## 2022-08-13 LAB — SARS CORONAVIRUS 2 BY RT PCR: SARS Coronavirus 2 by RT PCR: NEGATIVE

## 2022-08-13 LAB — CBG MONITORING, ED: Glucose-Capillary: 221 mg/dL — ABNORMAL HIGH (ref 70–99)

## 2022-08-13 MED ORDER — LACTATED RINGERS IV BOLUS
1000.0000 mL | Freq: Once | INTRAVENOUS | Status: AC
  Administered 2022-08-13: 1000 mL via INTRAVENOUS

## 2022-08-13 MED ORDER — HEPARIN SOD (PORK) LOCK FLUSH 100 UNIT/ML IV SOLN
500.0000 [IU] | Freq: Once | INTRAVENOUS | Status: AC
  Administered 2022-08-13: 500 [IU]
  Filled 2022-08-13: qty 5

## 2022-08-13 NOTE — ED Notes (Signed)
Delay in bloodwork/port access d/t patient needing sensitive skin tegaderm. Acquired from 6E, pt then ambulated to restroom and transported to imaging.

## 2022-08-13 NOTE — ED Provider Notes (Signed)
[place] EMERGENCY DEPARTMENT AT [facility] Provider Note   CSN: [id number] Arrival date & time: 08/13/22  1106     History  Chief Complaint  Patient presents with   Fall   Aphasia    Carolyn Hooper is a 62 y.o. female.  HPI 62 year old female with a history of lung cancer with brain metastases presents with speech and coordination difficulty. She's had similar but unchanged symptoms since being in the [facility] 6 days ago. She had her dexamethasone increased back then, which has affected her sugar and appetite, but not her speech. No new focal weakness. She fell 2 days ago when she slipped in the rain. Has a nose abrasion and injured her left hand and elbow. Some mild back discomfort after the fall but not bad. Some mildly worse cough since then as well. No new dyspnea or chest pain.  She was recently treated for gallbladder infection with Augmentin and states that she has no further abdominal pain but has been having diarrhea ever since taking the antibiotic and this has continued over the last couple days despite finishing the antibiotic.  Home Medications Prior to Admission medications   Medication Sig Start Date End Date Taking? Authorizing Provider  albuterol (VENTOLIN HFA) 108 (90 Base) MCG/ACT inhaler Inhale 2 puffs into the lungs every 6 (six) hours as needed. 01/08/21   [name], FNP  clonazePAM (KLONOPIN) 0.5 MG tablet Take 1 tablet (0.5 mg total) by mouth at bedtime. 07/30/22   Johnson, Clanford L, MD  dexamethasone (DECADRON) 4 MG tablet Take 1 tablet (4 mg total) by mouth 2 (two) times daily with a meal. 08/07/22   Derwood Kaplan, MD  fluconazole (DIFLUCAN) 150 MG tablet Take 1 tablet (150 mg total) by mouth every three (3) days as needed (yeast infection). 07/30/22   Johnson, Clanford L, MD  folic acid (FOLVITE) 1 MG tablet Take 1 tablet (1 mg total) by mouth daily. 05/26/22   [name], PA-C  furosemide (LASIX) 20 MG tablet Take 1 tablet (20 mg total)  by mouth daily as needed. 06/20/22   Heilingoetter, [name], PA-C  HYDROcodone-acetaminophen (NORCO) 5-325 MG tablet Take 1 - 2 tablets by mouth every 6 (six) hours as needed for moderate pain. 05/30/22   [name], FNP  insulin glargine (LANTUS SOLOSTAR) 100 UNIT/ML Solostar Pen Inject 8 Units into the skin daily. If BS over 200 increase by 2 units 07/30/22   Johnson, Clanford L, MD  Insulin Pen Needle (PEN NEEDLES) 32G X 4 MM MISC Use to inject insulin daily as directed. 07/09/22   [name], FNP  levETIRAcetam (KEPPRA) 1000 MG tablet Take 1 tablet (1,000 mg total) by mouth 2 (two) times daily. 07/31/22   [name], MD  lidocaine (XYLOCAINE) 5 % ointment Apply topically as needed. 05/30/22   [name], FNP  lidocaine-prilocaine (EMLA) cream Apply to port prior to access 04/25/22   [name], MD  magic mouthwash (nystatin, diphenhydrAMINE, alum & mag hydroxide) suspension mixture Take 5 mLs by mouth 3 (three) times daily as needed for mouth pain. 07/22/22   Heilingoetter, [name], PA-C  metFORMIN (GLUCOPHAGE) 1000 MG tablet Take 1 tablet (1,000 mg total) by mouth 2 (two) times daily. 05/10/22   [name], FNP  ondansetron (ZOFRAN) 4 MG tablet Take 1 tablet (4 mg total) by mouth every 6 (six) hours as needed for nausea. Patient taking differently: Take 4-8 mg by mouth every 6 (six)  hours as needed for nausea. 07/17/22   [name], [name], MD  potassium chloride SA (KLOR-CON M) 20 MEQ tablet Take 1 tablet (20 mEq total) by mouth 2 (two) times daily for 7 days. 08/07/22 08/14/22  Shanon Ace, PA-C  prochlorperazine (COMPAZINE) 10 MG tablet Take 1 tablet (10 mg total) by mouth every 6 (six) hours as needed. 05/30/22   Heilingoetter, [name], PA-C  saccharomyces boulardii (FLORASTOR) 250 MG capsule Take 1 capsule (250 mg total) by mouth 2 (two) times daily for 15 days. 07/30/22 08/14/22  [name], MD  triamcinolone ointment (KENALOG) 0.5 % Apply  topically to affected area twice daily as directed, 07/30/22   [name], MD      Allergies    Adhesive [tape] and Statins    Review of Systems   Review of Systems  Constitutional:  Negative for fever.  HENT:  Negative for nosebleeds.   Eyes:  Negative for visual disturbance.  Respiratory:  Positive for cough. Negative for shortness of breath.   Cardiovascular:  Negative for chest pain.  Gastrointestinal:  Positive for diarrhea and nausea. Negative for abdominal pain and vomiting.  Musculoskeletal:  Positive for arthralgias. Negative for back pain.  Neurological:  Positive for speech difficulty. Negative for weakness and numbness.    Physical Exam Updated Vital Signs BP (!) 148/80   Pulse (!) 57   Temp (!) 97.5 F (36.4 C) (Oral)   Resp 15   SpO2 100%  Physical Exam Vitals and nursing note reviewed.  Constitutional:      Appearance: She is well-developed.  HENT:     Head: Normocephalic.      Nose: Signs of injury (abrasion) present.     Right Nostril: No epistaxis.     Left Nostril: No epistaxis.  Eyes:     Extraocular Movements: Extraocular movements intact.     Pupils: Pupils are equal, round, and reactive to light.  Cardiovascular:     Rate and Rhythm: Normal rate and regular rhythm.     Heart sounds: Normal heart sounds.  Pulmonary:     Effort: Pulmonary effort is normal.     Breath sounds: Normal breath sounds.  Abdominal:     Palpations: Abdomen is soft.     Tenderness: There is no abdominal tenderness.  Musculoskeletal:     Left elbow: Normal range of motion. Tenderness present.     Left hand: Tenderness present.     Cervical back: No tenderness. No spinous process tenderness or muscular tenderness.     Thoracic back: No tenderness.     Lumbar back: No tenderness.     Left knee: Normal range of motion. No tenderness.     Comments: Abrasion over dorsal left hand Abrasion over elbow Both are mildly tender but normal ROM  Skin:    General: Skin  is warm and dry.  Neurological:     Mental Status: She is alert.     Comments: CN 3-12 grossly intact. 5/5 strength in all 4 extremities. Grossly normal sensation. Normal finger to nose.      ED Results / Procedures / Treatments   Labs (all labs ordered are listed, but only abnormal results are displayed) Labs Reviewed  COMPREHENSIVE METABOLIC PANEL - Abnormal; Notable for the following components:      Result Value   Glucose, Bld 215 (*)    Albumin 3.4 (*)    AST 56 (*)    ALT 63 (*)    All other components  within normal limits  CBC WITH DIFFERENTIAL/PLATELET - Abnormal; Notable for the following components:   RBC 3.58 (*)    Hemoglobin 11.7 (*)    MCV 102.2 (*)    RDW 18.5 (*)    Abs Immature Granulocytes 0.09 (*)    All other components within normal limits  CBG MONITORING, ED - Abnormal; Notable for the following components:   Glucose-Capillary 221 (*)    All other components within normal limits  SARS CORONAVIRUS 2 BY RT PCR    EKG EKG Interpretation  Date/Time:  Tuesday Aug 13 2022 13:32:24 EDT Ventricular Rate:  58 PR Interval:  152 QRS Duration: 103 QT Interval:  484 QTC Calculation: 476 R Axis:   -1 Text Interpretation: Sinus rhythm Interpretation limited secondary to artifact otherwise similar to Jul 26 2022 Confirmed by Pricilla Loveless [phone]) on 08/13/2022 2:34:36 PM  Radiology CT Head Wo Contrast  Result Date: 08/13/2022 CLINICAL DATA:  Facial trauma EXAM: CT HEAD WITHOUT CONTRAST CT MAXILLOFACIAL WITHOUT CONTRAST TECHNIQUE: Multidetector CT imaging of the head and maxillofacial structures were performed using the standard protocol without intravenous contrast. Multiplanar CT image reconstructions of the maxillofacial structures were also generated. RADIATION DOSE REDUCTION: This exam was performed according to the departmental dose-optimization program which includes automated exposure control, adjustment of the mA and/or kV according to patient size and/or  use of iterative reconstruction technique. COMPARISON:  Brain MRI and head CT 08/07/2022 FINDINGS: CT HEAD FINDINGS Brain: There is no acute intracranial hemorrhage, extra-axial fluid collection, or acute infarct. Background parenchymal volume is normal. The ventricles are normal in size. There is edema in the left precentral gyrus related to the known metastatic lesion in this location common better evaluated on the recent brain MRI from 08/07/2022. The extent of edema is not significantly changed. There is no significant mass effect or midline shift. There is no other parenchymal edema. The punctate lesion in the right frontal lobe and right vestibular schwannoma seen on the prior MRI are not well seen on the current study. Gray-white differentiation is preserved. The pituitary and suprasellar region are normal. Vascular: No hyperdense vessel or unexpected calcification. Skull: Normal. Negative for fracture or focal lesion. Other: None. CT MAXILLOFACIAL FINDINGS Osseous: There is no acute facial bone fracture. There is no evidence of mandibular dislocation. There is no suspicious osseous lesion. Orbits: The globes and orbits are unremarkable. There is no retrobulbar hematoma or proptosis. Sinuses: There is mild mucosal thickening in the paranasal sinuses. Soft tissues: Unremarkable. IMPRESSION: 1. No acute intracranial pathology. 2. Stable edema in the left precentral gyrus related to the known metastatic lesion. No significant mass effect or midline shift. 3. No acute facial bone fracture. Electronically Signed   By: Lesia Hausen M.D.   On: 08/13/2022 13:13   CT Maxillofacial Wo Contrast  Result Date: 08/13/2022 CLINICAL DATA:  Facial trauma EXAM: CT HEAD WITHOUT CONTRAST CT MAXILLOFACIAL WITHOUT CONTRAST TECHNIQUE: Multidetector CT imaging of the head and maxillofacial structures were performed using the standard protocol without intravenous contrast. Multiplanar CT image reconstructions of the maxillofacial  structures were also generated. RADIATION DOSE REDUCTION: This exam was performed according to the departmental dose-optimization program which includes automated exposure control, adjustment of the mA and/or kV according to patient size and/or use of iterative reconstruction technique. COMPARISON:  Brain MRI and head CT 08/07/2022 FINDINGS: CT HEAD FINDINGS Brain: There is no acute intracranial hemorrhage, extra-axial fluid collection, or acute infarct. Background parenchymal volume is normal. The ventricles are normal in size. There  is edema in the left precentral gyrus related to the known metastatic lesion in this location common better evaluated on the recent brain MRI from 08/07/2022. The extent of edema is not significantly changed. There is no significant mass effect or midline shift. There is no other parenchymal edema. The punctate lesion in the right frontal lobe and right vestibular schwannoma seen on the prior MRI are not well seen on the current study. Gray-white differentiation is preserved. The pituitary and suprasellar region are normal. Vascular: No hyperdense vessel or unexpected calcification. Skull: Normal. Negative for fracture or focal lesion. Other: None. CT MAXILLOFACIAL FINDINGS Osseous: There is no acute facial bone fracture. There is no evidence of mandibular dislocation. There is no suspicious osseous lesion. Orbits: The globes and orbits are unremarkable. There is no retrobulbar hematoma or proptosis. Sinuses: There is mild mucosal thickening in the paranasal sinuses. Soft tissues: Unremarkable. IMPRESSION: 1. No acute intracranial pathology. 2. Stable edema in the left precentral gyrus related to the known metastatic lesion. No significant mass effect or midline shift. 3. No acute facial bone fracture. Electronically Signed   By: Lesia Hausen M.D.   On: 08/13/2022 13:13   DG Elbow Complete Left  Result Date: 08/13/2022 CLINICAL DATA:  Pain after fall EXAM: LEFT ELBOW - COMPLETE 4  VIEW COMPARISON:  None Available. FINDINGS: Hyperostosis along the distal humerus. No acute fracture or dislocation. Preserved joint spaces and bone mineralization. No joint effusion on lateral view. Mild dorsal soft tissue swelling about the olecranon. IMPRESSION: Mild dorsal soft tissue swelling about the olecranon. Please correlate for soft tissue thickening or abnormal bursa. Electronically Signed   By: Karen Kays M.D.   On: 08/13/2022 12:44   DG Hand Complete Left  Result Date: 08/13/2022 CLINICAL DATA:  Pain after fall EXAM: LEFT HAND - COMPLETE 3 VIEW COMPARISON:  None Available. FINDINGS: No fracture or dislocation. Preserved joint spaces and bone mineralization. IMPRESSION: No acute osseous abnormality. Electronically Signed   By: Karen Kays M.D.   On: 08/13/2022 12:43   DG Chest 2 View  Result Date: 08/13/2022 CLINICAL DATA:  Cough. Fall 2 days ago. History of brain neoplasm and lung cancer EXAM: CHEST - 2 VIEW COMPARISON:  Chest x-ray one view 07/26/2018 FINDINGS: Stable right IJ chest port with the tip near the SVC right atrial junction. Mass lesion again seen in the medial left lung base. Please correlate with prior CT scan. Small right-sided midlung 11 mm nodule as well. No pneumothorax, effusion or edema. Normal cardiopericardial silhouette. Degenerative changes of the spine. IMPRESSION: Stable mass lesions at the left lung base and right midlung. Chest port. No pneumothorax or edema. Electronically Signed   By: Karen Kays M.D.   On: 08/13/2022 12:42    Procedures Procedures    Medications Ordered in ED Medications  lactated ringers bolus 1,000 mL (1,000 mLs Intravenous New Bag/Given 08/13/22 1329)    ED Course/ Medical Decision Making/ A&P                             Medical Decision Making Amount and/or Complexity of Data Reviewed Labs: ordered.    Details: Hyperglycemia without acidosis. Chronic anemia. Stable mild LFT abnormalities Radiology: ordered and independent  interpretation performed.    Details: CT head with stable edema ECG/medicine tests: ordered and independent interpretation performed.    Details: No ischemia   Patient presents with stable but not improving neurosymptoms from her metastatic brain cancer.  She has been on increased Decadron for about a week.  She had a fall that was related to a slip rather than acute weakness.  From a fall perspective she has no acute fractures noted.  She has some acute on chronic back pain but did not feel like it was bad enough that she would want an x-ray and so this was deferred.  No new neurosymptoms.  Discussed her workup with Dr. Barbaraann Cao.  Her edema is essentially unchanged and certainly not worse based on CT imaging.  He feels that we do not need further imaging such as MRI but we can increase her to 3 times daily Decadron and he will see her in 2 days.  He will arrange follow-up.  Otherwise the patient feels like she has enough Decadron to do this and has enough insulin at home.  Otherwise she has some mild hyperglycemia but no acidosis.  Will discharge home with return precautions.        Final Clinical Impression(s) / ED Diagnoses Final diagnoses:  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[facility])  Abrasions of multiple sites    Rx / DC Orders ED Discharge Orders     None         Pricilla Loveless, MD 08/13/22 1440

## 2022-08-13 NOTE — Telephone Encounter (Signed)
This patient called in and stated that she is going to call EMS to come and transfer her to [facility] because she has developed some eye twitching, her words are not getting better and she does not feel like the steroids helping. Patient also states that she has had a fall and hit her face and she has some scratches.   She would like to know if we can call and let the emergency room know that it is ok to use her port. Patient continued to confuse the word "port" with "Diabetes".  She states "I am not doing well".  This nurse has observed that patients stuttering appears to be getting worse.  This nurse advised that the provider will be made aware.  No further questions or concerns at this time.

## 2022-08-13 NOTE — ED Notes (Signed)
Patient transported to X-ray 

## 2022-08-13 NOTE — Discharge Instructions (Addendum)
Increase your Dexamethasone (Decadron) from twice per day to THREE times per day. Dr. Liana Gerold office will call you for an appointment later this week (likely Thursday).  If you develop headache, new or worse symptoms, vomiting, intractable pain, or any other new/concerning symptoms return to the ER or call 911.

## 2022-08-13 NOTE — ED Triage Notes (Signed)
Pt BIBA from home for continued sx from swelling brain cancer tumor - difficulty with words and coordination. Not worse but not better despite increased steroids. Fall 2 days ago, hit head, no thinners. Hit head. Scrape on bridge of nose. Denies headaches, vision changes. Endorses back pain and new cough.  130/60 HR 68 97% RA CBG 234

## 2022-08-14 ENCOUNTER — Inpatient Hospital Stay (HOSPITAL_BASED_OUTPATIENT_CLINIC_OR_DEPARTMENT_OTHER): Payer: BC Managed Care – PPO | Admitting: Internal Medicine

## 2022-08-14 ENCOUNTER — Inpatient Hospital Stay: Payer: BC Managed Care – PPO

## 2022-08-14 ENCOUNTER — Other Ambulatory Visit (HOSPITAL_COMMUNITY): Payer: Self-pay

## 2022-08-14 ENCOUNTER — Encounter: Payer: Self-pay | Admitting: Medical Oncology

## 2022-08-14 ENCOUNTER — Telehealth: Payer: Self-pay | Admitting: Family

## 2022-08-14 ENCOUNTER — Other Ambulatory Visit: Payer: Self-pay | Admitting: Family

## 2022-08-14 VITALS — BP 139/81 | HR 62 | Temp 98.0°F | Resp 17 | Wt 228.4 lb

## 2022-08-14 DIAGNOSIS — Z7984 Long term (current) use of oral hypoglycemic drugs: Secondary | ICD-10-CM | POA: Diagnosis not present

## 2022-08-14 DIAGNOSIS — C3490 Malignant neoplasm of unspecified part of unspecified bronchus or lung: Secondary | ICD-10-CM

## 2022-08-14 DIAGNOSIS — E1165 Type 2 diabetes mellitus with hyperglycemia: Secondary | ICD-10-CM

## 2022-08-14 DIAGNOSIS — Z794 Long term (current) use of insulin: Secondary | ICD-10-CM | POA: Diagnosis not present

## 2022-08-14 DIAGNOSIS — Z87891 Personal history of nicotine dependence: Secondary | ICD-10-CM | POA: Diagnosis not present

## 2022-08-14 DIAGNOSIS — C3432 Malignant neoplasm of lower lobe, left bronchus or lung: Secondary | ICD-10-CM | POA: Diagnosis not present

## 2022-08-14 DIAGNOSIS — Z79899 Other long term (current) drug therapy: Secondary | ICD-10-CM

## 2022-08-14 DIAGNOSIS — Z5111 Encounter for antineoplastic chemotherapy: Secondary | ICD-10-CM | POA: Diagnosis not present

## 2022-08-14 DIAGNOSIS — C3492 Malignant neoplasm of unspecified part of left bronchus or lung: Secondary | ICD-10-CM

## 2022-08-14 DIAGNOSIS — Z5112 Encounter for antineoplastic immunotherapy: Secondary | ICD-10-CM | POA: Diagnosis not present

## 2022-08-14 DIAGNOSIS — F1320 Sedative, hypnotic or anxiolytic dependence, uncomplicated: Secondary | ICD-10-CM

## 2022-08-14 DIAGNOSIS — Z95828 Presence of other vascular implants and grafts: Secondary | ICD-10-CM

## 2022-08-14 DIAGNOSIS — F411 Generalized anxiety disorder: Secondary | ICD-10-CM

## 2022-08-14 DIAGNOSIS — C7931 Secondary malignant neoplasm of brain: Secondary | ICD-10-CM | POA: Diagnosis not present

## 2022-08-14 DIAGNOSIS — E876 Hypokalemia: Secondary | ICD-10-CM | POA: Diagnosis not present

## 2022-08-14 LAB — CBC WITH DIFFERENTIAL (CANCER CENTER ONLY)
Abs Immature Granulocytes: 0.06 10*3/uL (ref 0.00–0.07)
Basophils Absolute: 0 10*3/uL (ref 0.0–0.1)
Basophils Relative: 0 %
Eosinophils Absolute: 0 10*3/uL (ref 0.0–0.5)
Eosinophils Relative: 0 %
HCT: 36.8 % (ref 36.0–46.0)
Hemoglobin: 12.2 g/dL (ref 12.0–15.0)
Immature Granulocytes: 1 %
Lymphocytes Relative: 13 %
Lymphs Abs: 1.2 10*3/uL (ref 0.7–4.0)
MCH: 33.3 pg (ref 26.0–34.0)
MCHC: 33.2 g/dL (ref 30.0–36.0)
MCV: 100.5 fL — ABNORMAL HIGH (ref 80.0–100.0)
Monocytes Absolute: 0.7 10*3/uL (ref 0.1–1.0)
Monocytes Relative: 8 %
Neutro Abs: 6.7 10*3/uL (ref 1.7–7.7)
Neutrophils Relative %: 78 %
Platelet Count: 231 10*3/uL (ref 150–400)
RBC: 3.66 MIL/uL — ABNORMAL LOW (ref 3.87–5.11)
RDW: 18.2 % — ABNORMAL HIGH (ref 11.5–15.5)
WBC Count: 8.7 10*3/uL (ref 4.0–10.5)
nRBC: 0 % (ref 0.0–0.2)

## 2022-08-14 LAB — CMP (CANCER CENTER ONLY)
ALT: 61 U/L — ABNORMAL HIGH (ref 0–44)
AST: 37 U/L (ref 15–41)
Albumin: 3.7 g/dL (ref 3.5–5.0)
Alkaline Phosphatase: 57 U/L (ref 38–126)
Anion gap: 10 (ref 5–15)
BUN: 18 mg/dL (ref 8–23)
CO2: 27 mmol/L (ref 22–32)
Calcium: 9.5 mg/dL (ref 8.9–10.3)
Chloride: 100 mmol/L (ref 98–111)
Creatinine: 0.8 mg/dL (ref 0.44–1.00)
GFR, Estimated: >60 mL/min (ref >60)
Glucose, Bld: 246 mg/dL — ABNORMAL HIGH (ref 70–99)
Potassium: 4.6 mmol/L (ref 3.5–5.1)
Sodium: 137 mmol/L (ref 135–145)
Total Bilirubin: 0.7 mg/dL (ref 0.3–1.2)
Total Protein: 6.8 g/dL (ref 6.5–8.1)

## 2022-08-14 MED ORDER — SODIUM CHLORIDE 0.9 % IV SOLN: Freq: Once | INTRAVENOUS | Status: AC

## 2022-08-14 MED ORDER — SODIUM CHLORIDE 0.9 % IV SOLN
200.0000 mg | Freq: Once | INTRAVENOUS | Status: AC
  Administered 2022-08-14: 200 mg via INTRAVENOUS
  Filled 2022-08-14: qty 200

## 2022-08-14 MED ORDER — SODIUM CHLORIDE 0.9 % IV SOLN
500.0000 mg/m2 | Freq: Once | INTRAVENOUS | Status: AC
  Administered 2022-08-14: 1100 mg via INTRAVENOUS
  Filled 2022-08-14: qty 40

## 2022-08-14 MED ORDER — SODIUM CHLORIDE 0.9% FLUSH
10.0000 mL | Freq: Once | INTRAVENOUS | Status: AC
  Administered 2022-08-14: 10 mL

## 2022-08-14 MED ORDER — PROCHLORPERAZINE MALEATE 10 MG PO TABS
10.0000 mg | ORAL_TABLET | Freq: Once | ORAL | Status: AC
  Administered 2022-08-14: 10 mg via ORAL
  Filled 2022-08-14: qty 1

## 2022-08-14 MED ORDER — LANTUS SOLOSTAR 100 UNIT/ML ~~LOC~~ SOPN
8.0000 [IU] | PEN_INJECTOR | Freq: Every day | SUBCUTANEOUS | 0 refills | Status: DC
Start: 2022-08-14 — End: 2022-08-15

## 2022-08-14 MED ORDER — SODIUM CHLORIDE 0.9% FLUSH
10.0000 mL | INTRAVENOUS | Status: DC | PRN
  Administered 2022-08-14: 10 mL

## 2022-08-14 MED ORDER — HEPARIN SOD (PORK) LOCK FLUSH 100 UNIT/ML IV SOLN
500.0000 [IU] | Freq: Once | INTRAVENOUS | Status: AC | PRN
  Administered 2022-08-14: 500 [IU]

## 2022-08-14 NOTE — Telephone Encounter (Signed)
Pt aware insulin sent into [facility] and clonazepam will be addressed tomorrow by PCP

## 2022-08-14 NOTE — Telephone Encounter (Signed)
TC from [facility] RE: pt's Clonazepam, pt called them saying they are not wanting to give her this medication. PCP refilled on 07/12/22 #30 w/ 2 refills which would get her to her 10/14/22 visit Except that at hospital discharge on 07/30/22 Dr. Laural Benes did a script w/ no qty, no RFs which was No Print which to the pharmacy was a discontinuation of the medication On the AVS the medication is on her med list with when to take her next dose Please advise on this refill till her Next visit on 10/14/22

## 2022-08-14 NOTE — Progress Notes (Signed)
Patient seen by Dr. Gypsy Balsam are within treatment parameters.  Labs reviewed: and are within treatment parameters.  Per physician team, patient is ready for treatment and there are NO modifications to the treatment plan. Pemetrexed , pembrolizumab

## 2022-08-14 NOTE — Telephone Encounter (Signed)
  Prescription Request  08/14/2022  What is the name of the medication or equipment? CLONAZEPAM and INSULIN  Have you contacted your pharmacy to request a refill? YES  Which pharmacy would you like this sent to? [facility]   Patient notified that their request is being sent to the clinical staff for review and that they should receive a response within 2 business days.

## 2022-08-14 NOTE — Progress Notes (Signed)
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 according to the departmental dose-optimization program which includes automated exposure control, adjustment of the mA and/or kV according to patient size and/or use of iterative reconstruction technique. CONTRAST:  OMNIPAQUE IOHEXOL 300 MG/ML  SOLN COMPARISON:  Multiple exams, including 05/13/2022 FINDINGS: CT CHEST FINDINGS Cardiovascular: Right Port-A-Cath tip: SVC. Coronary, aortic arch, and branch vessel atherosclerotic vascular disease. Mediastinum/Nodes: Mild and mildly worsened thoracic adenopathy. Left paratracheal node 1.2 cm in short axis on image 25 series 504, previously 0.9 cm. Right hilar node 1.1 cm in short axis on image 29 series 504, previously 0.8 cm. Two adjacent infrahilar nodes on the right side are mildly enlarged at 1.0 cm in diameter each on image 35 series 500 for, previously 0.9 cm. Lungs/Pleura: Left lower lobe mass 6.4 by 4.1 by 5.4 cm (volume = 74 cm^3), previously 5.5 by 4.0 by 4.7 cm (volume = 54 cm^3). This mass abuts the medial pleural margin and there is a trace pleural effusion 1, nonspecific for malignancy. There are few scattered bilateral pulmonary nodules  which are stable to mildly increased. For example a superior segment right lower lobe nodule on image 63 series 507 measures 9 by 8 by 7 mm, and previously measured 8 by 7 by 6 mm. No definite new nodules identified. These nodules likely reflect metastatic disease. Musculoskeletal: Thoracic spondylosis. No compelling findings of thoracic skeletal metastatic disease. CT ABDOMEN PELVIS FINDINGS Hepatobiliary: Hypodensity compatible with benign steatosis in segment 4 of the liver posteriorly. This warrants no further workup. There is also some steatosis medially in segment 4b of the liver adjacent to the falciform ligament. Large gallstone in the gallbladder measuring up to 4.8 cm. Distal to this stone within the gallbladder there is diffuse homogeneous accentuated density probably reflecting concentrated sludge distal to the stone; bile proximal to the stone is of normal fluid density. Pancreas: Better seen than on prior exams due to loss motion artifact today, there is a 6 mm fluid density lesion in the pancreatic head on image 74 of series 500 for which is probably a tiny cystic lesion. Possibilities include a small postinflammatory cystic lesion or tiny indolent intraductal papillary mucinous neoplasm. Typical guidelines would call for follow up pancreatic protocol MRI with and without  contrast in 1 years time. However, in this clinical context, satisfactory surveillance imaging can probably be achieved with anticipated follow up oncology CT imaging of the abdomen related to the patient's metastatic thoracic malignancy. Spleen: Unremarkable Adrenals/Urinary Tract: Unremarkable Stomach/Bowel: Unremarkable Vascular/Lymphatic: Atherosclerosis is present, including aortoiliac atherosclerotic disease. Aortocaval lymph node 0.7 cm in short axis on image 87 series 504, within normal limits. No overtly pathologic adenopathy is identified in the abdomen or pelvis. Reproductive: Uterus absent.  Adnexa unremarkable. Other:  Low-level subcutaneous edema particularly laterally and posteriorly in the hip region. Musculoskeletal: Bridging degenerative facet spurring at L5-S1 especially on the right. Lumbar spondylosis and degenerative disc disease contribute to impingement most notably at L3-4, L4-5, and L5-S1. Low position of the anorectal junction compatible with pelvic floor laxity. Sclerotic lesion with lobulated borders the lateral margin of the left femoral head and neck on image 94 series 505, no change from 02/12/2022, favor benign etiology such as bone island or densely calcified enchondroma over metastatic lesion. IMPRESSION: 1. The dominant left lower lobe mass has a volume of 74 cubic cm, previously 54 cubic cm. This mass abuts the medial pleural margin and there is a trace left pleural effusion. 2. Scattered bilateral pulmonary nodules are stable to mildly increased in size, compatible with metastatic disease. 3. Mild and mildly worsened right hilar and left paratracheal adenopathy. 4. No findings of metastatic disease to the abdomen/pelvis or skeleton. 5. 6 mm fluid density lesion in the pancreatic head, probably a tiny cystic lesion. Possibilities include a small postinflammatory cystic lesion or tiny indolent intraductal papillary mucinous neoplasm. Typical guidelines would call for follow up pancreatic protocol MRI with and without contrast in 1 years time. However, in this clinical context, satisfactory surveillance imaging can probably be achieved with anticipated follow up oncology CT imaging of the abdomen related to the patient's metastatic thoracic malignancy. 6. Other imaging findings of potential clinical significance: Coronary, aortic arch, and branch vessel atherosclerotic vascular disease. Cholelithiasis. Low position of the anorectal junction compatible with pelvic floor laxity. Lumbar spondylosis and degenerative disc disease causing impingement most notably at L3-4, L4-5, and L5-S1. 7. Low-level subcutaneous  edema laterally and posteriorly in the hip region. 8. Aortic atherosclerosis. Aortic Atherosclerosis (ICD10-I70.0). Electronically Signed   By: Gaylyn Rong M.D.   On: 07/16/2022 11:33   CT Abdomen Pelvis W Contrast  Result Date: 07/16/2022 CLINICAL DATA:  Restaging non-small cell lung cancer * Tracking Code: BO * EXAM: CT CHEST, ABDOMEN, AND PELVIS WITH CONTRAST TECHNIQUE: Multidetector CT imaging of the chest, abdomen and pelvis was performed following the standard protocol during bolus administration of intravenous contrast. RADIATION DOSE REDUCTION: This exam was performed according to the departmental dose-optimization program which includes automated exposure control, adjustment of the mA and/or kV according to patient size and/or use of iterative reconstruction technique. CONTRAST:  OMNIPAQUE IOHEXOL 300 MG/ML  SOLN COMPARISON:  Multiple exams, including 05/13/2022 FINDINGS: CT CHEST FINDINGS Cardiovascular: Right Port-A-Cath tip: SVC. Coronary, aortic arch, and branch vessel atherosclerotic vascular disease. Mediastinum/Nodes: Mild and mildly worsened thoracic adenopathy. Left paratracheal node 1.2 cm in short axis on image 25 series 504, previously 0.9 cm. Right hilar node 1.1 cm in short axis on image 29 series 504, previously 0.8 cm. Two adjacent infrahilar nodes on the right side are mildly enlarged at 1.0 cm in diameter each on image 35 series 500 for, previously 0.9 cm. Lungs/Pleura: Left lower lobe mass 6.4 by 4.1 by 5.4 cm (volume = 74 cm^3), previously 5.5 by 4.0  by 4.7 cm (volume = 54 cm^3). This mass abuts the medial pleural margin and there is a trace pleural effusion 1, nonspecific for malignancy. There are few scattered bilateral pulmonary nodules which are stable to mildly increased. For example a superior segment right lower lobe nodule on image 63 series 507 measures 9 by 8 by 7 mm, and previously measured 8 by 7 by 6 mm. No definite new nodules identified. These nodules  likely reflect metastatic disease. Musculoskeletal: Thoracic spondylosis. No compelling findings of thoracic skeletal metastatic disease. CT ABDOMEN PELVIS FINDINGS Hepatobiliary: Hypodensity compatible with benign steatosis in segment 4 of the liver posteriorly. This warrants no further workup. There is also some steatosis medially in segment 4b of the liver adjacent to the falciform ligament. Large gallstone in the gallbladder measuring up to 4.8 cm. Distal to this stone within the gallbladder there is diffuse homogeneous accentuated density probably reflecting concentrated sludge distal to the stone; bile proximal to the stone is of normal fluid density. Pancreas: Better seen than on prior exams due to loss motion artifact today, there is a 6 mm fluid density lesion in the pancreatic head on image 74 of series 500 for which is probably a tiny cystic lesion. Possibilities include a small postinflammatory cystic lesion or tiny indolent intraductal papillary mucinous neoplasm. Typical guidelines would call for follow up pancreatic protocol MRI with and without contrast in 1 years time. However, in this clinical context, satisfactory surveillance imaging can probably be achieved with anticipated follow up oncology CT imaging of the abdomen related to the patient's metastatic thoracic malignancy. Spleen: Unremarkable Adrenals/Urinary Tract: Unremarkable Stomach/Bowel: Unremarkable Vascular/Lymphatic: Atherosclerosis is present, including aortoiliac atherosclerotic disease. Aortocaval lymph node 0.7 cm in short axis on image 87 series 504, within normal limits. No overtly pathologic adenopathy is identified in the abdomen or pelvis. Reproductive: Uterus absent.  Adnexa unremarkable. Other: Low-level subcutaneous edema particularly laterally and posteriorly in the hip region. Musculoskeletal: Bridging degenerative facet spurring at L5-S1 especially on the right. Lumbar spondylosis and degenerative disc disease  contribute to impingement most notably at L3-4, L4-5, and L5-S1. Low position of the anorectal junction compatible with pelvic floor laxity. Sclerotic lesion with lobulated borders the lateral margin of the left femoral head and neck on image 94 series 505, no change from 02/12/2022, favor benign etiology such as bone island or densely calcified enchondroma over metastatic lesion. IMPRESSION: 1. The dominant left lower lobe mass has a volume of 74 cubic cm, previously 54 cubic cm. This mass abuts the medial pleural margin and there is a trace left pleural effusion. 2. Scattered bilateral pulmonary nodules are stable to mildly increased in size, compatible with metastatic disease. 3. Mild and mildly worsened right hilar and left paratracheal adenopathy. 4. No findings of metastatic disease to the abdomen/pelvis or skeleton. 5. 6 mm fluid density lesion in the pancreatic head, probably a tiny cystic lesion. Possibilities include a small postinflammatory cystic lesion or tiny indolent intraductal papillary mucinous neoplasm. Typical guidelines would call for follow up pancreatic protocol MRI with and without contrast in 1 years time. However, in this clinical context, satisfactory surveillance imaging can probably be achieved with anticipated follow up oncology CT imaging of the abdomen related to the patient's metastatic thoracic malignancy. 6. Other imaging findings of potential clinical significance: Coronary, aortic arch, and branch vessel atherosclerotic vascular disease. Cholelithiasis. Low position of the anorectal junction compatible with pelvic floor laxity. Lumbar spondylosis and degenerative disc disease causing impingement most notably at L3-4, L4-5, and L5-S1.  7. Low-level subcutaneous edema laterally and posteriorly in the hip region. 8. Aortic atherosclerosis. Aortic Atherosclerosis (ICD10-I70.0). Electronically Signed   By: Gaylyn Rong M.D.   On: 07/16/2022 11:33   US Abdomen Limited RUQ  (LIVER/GB)  Result Date: 07/16/2022 CLINICAL DATA:  Right upper quadrant pain EXAM: ULTRASOUND ABDOMEN LIMITED RIGHT UPPER QUADRANT COMPARISON:  CT 07/15/2022 FINDINGS: Gallbladder: Gallbladder is distended. 5 cm gallstone lodged in the gallbladder neck. Small stones and sludge also seen within the gallbladder. No wall thickening or sonographic Murphy sign. Common bile duct: Diameter: Normal caliber, 4 mm Liver: Coarsened, increased echotexture compatible with fatty infiltration. No focal abnormality. Portal vein is patent on color Doppler imaging with normal direction of blood flow towards the liver. Other: None. IMPRESSION: Large 5 cm gallstone lodged in the gallbladder neck. Other smaller stones and sludge within the gallbladder with gallbladder distension. No wall thickening or sonographic Murphy sign. Fatty liver. Electronically Signed   By: Charlett Nose M.D.   On: 07/16/2022 09:27   DG Chest Portable 1 View  Result Date: 07/16/2022 CLINICAL DATA:  Abdominal pain with nausea and vomiting. History of metastatic lung cancer. EXAM: PORTABLE CHEST 1 VIEW COMPARISON:  CT chest from yesterday. FINDINGS: Unchanged right chest wall port catheter. The heart size and mediastinal contours are within normal limits. Normal pulmonary vascularity. Small metastases in both lungs are not apparent by x-ray. No focal consolidation, pleural effusion, or pneumothorax. No acute osseous abnormality. IMPRESSION: 1. No active disease. Small bilateral pulmonary metastases are not apparent by x-ray. Electronically Signed   By: Obie Dredge M.D.   On: 07/16/2022 09:02    ASSESSMENT AND PLAN: This is a very pleasant 62 years old white female with Stage IV (T3, N0, M1b) non-small cell lung cancer, adenosquamous carcinoma presented with large medial left lower lobe lung mass with bilateral pulmonary nodules and metastatic disease to the brain diagnosed in November 2023 the patient has no actionable mutations and has negative PD-L1  expression. 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 She is currently undergoing treatment with systemic chemotherapy with carboplatin for AUC of 5, Alimta 500 Mg/M2 and Keytruda 200 Mg IV every 3 weeks status post 7 cycles.   The patient has been tolerating this treatment well with no concerning adverse effect except for mild fatigue. I recommended for her to proceed with cycle #8 today as planned. She will come back for follow-up visit in 3 weeks for evaluation before the next cycle of her treatment. For the brain metastasis her recent MRI of the brain showed no concerning findings for progression and she is followed by neuro-oncology by Dr. Barbaraann Cao. The patient was advised to call immediately if she has any other concerning symptoms in the interval. The patient voices understanding of current disease status and treatment options and is in agreement with the current care plan.  All questions were answered. The patient knows to call the clinic with any problems, questions or concerns. We can certainly see the patient much sooner if necessary.  The total time spent in the appointment was 30 minutes.  Disclaimer: This note was dictated with voice recognition software. Similar sounding words can inadvertently be transcribed and may not be corrected upon review.

## 2022-08-14 NOTE — Telephone Encounter (Signed)
Insulin was sent to covering provider since last RFd by hospitalist and there was a dose change, did this off yesterday's RF request.

## 2022-08-14 NOTE — Telephone Encounter (Signed)
Clonazepam to be addressed tomorrow by PCP Please advise on Insulin Last OV 07/12/22. Last RF Dr Laural Benes. Next OV 10/14/22  Please see [name]'s 07/12/22 Increase Lantus to 10 units, then 2 units every day until glucose is <180's  in AM

## 2022-08-14 NOTE — Patient Instructions (Signed)
[facility]  Discharge Instructions: Thank you for choosing [facility] to provide your oncology and hematology care.   If you have a lab appointment with the Cancer Center, please go directly to the Cancer Center and check in at the registration area.   Wear comfortable clothing and clothing appropriate for easy access to any Portacath or PICC line.   We strive to give you quality time with your provider. You may need to reschedule your appointment if you arrive late (15 or more minutes).  Arriving late affects you and other patients whose appointments are after yours.  Also, if you miss three or more appointments without notifying the office, you may be dismissed from the clinic at the provider's discretion.      For prescription refill requests, have your pharmacy contact our office and allow 72 hours for refills to be completed.    Today you received the following chemotherapy and/or immunotherapy agents: Keytruda, Alimta.       To help prevent nausea and vomiting after your treatment, we encourage you to take your nausea medication as directed.  BELOW ARE SYMPTOMS THAT SHOULD BE REPORTED IMMEDIATELY: *FEVER GREATER THAN 100.4 F (38 C) OR HIGHER *CHILLS OR SWEATING *NAUSEA AND VOMITING THAT IS NOT CONTROLLED WITH YOUR NAUSEA MEDICATION *UNUSUAL SHORTNESS OF BREATH *UNUSUAL BRUISING OR BLEEDING *URINARY PROBLEMS (pain or burning when urinating, or frequent urination) *BOWEL PROBLEMS (unusual diarrhea, constipation, pain near the anus) TENDERNESS IN MOUTH AND THROAT WITH OR WITHOUT PRESENCE OF ULCERS (sore throat, sores in mouth, or a toothache) UNUSUAL RASH, SWELLING OR PAIN  UNUSUAL VAGINAL DISCHARGE OR ITCHING   Items with * indicate a potential emergency and should be followed up as soon as possible or go to the Emergency Department if any problems should occur.  Please show the CHEMOTHERAPY ALERT CARD or IMMUNOTHERAPY ALERT CARD  at check-in to the Emergency Department and triage nurse.  Should you have questions after your visit or need to cancel or reschedule your appointment, please contact [facility]  Dept: [phone]  and follow the prompts.  Office hours are 8:00 a.m. to 4:30 p.m. Monday - Friday. Please note that voicemails left after 4:00 p.m. may not be returned until the following business day.  We are closed weekends and major holidays. You have access to a nurse at all times for urgent questions. Please call the main number to the clinic Dept: [phone] and follow the prompts.   For any non-urgent questions, you may also contact your provider using MyChart. We now offer e-Visits for anyone 18 and older to request care online for non-urgent symptoms. For details visit [email or web address].   Also download the MyChart app! Go to the app store, search "MyChart", open the app, select [facility], and log in with your MyChart username and password.   

## 2022-08-15 ENCOUNTER — Telehealth: Payer: Self-pay | Admitting: Family

## 2022-08-15 ENCOUNTER — Other Ambulatory Visit: Payer: Self-pay | Admitting: Family Medicine

## 2022-08-15 ENCOUNTER — Other Ambulatory Visit (HOSPITAL_COMMUNITY): Payer: Self-pay

## 2022-08-15 ENCOUNTER — Other Ambulatory Visit: Payer: Self-pay

## 2022-08-15 DIAGNOSIS — Z79899 Other long term (current) drug therapy: Secondary | ICD-10-CM

## 2022-08-15 DIAGNOSIS — F411 Generalized anxiety disorder: Secondary | ICD-10-CM

## 2022-08-15 DIAGNOSIS — E1165 Type 2 diabetes mellitus with hyperglycemia: Secondary | ICD-10-CM

## 2022-08-15 DIAGNOSIS — F1320 Sedative, hypnotic or anxiolytic dependence, uncomplicated: Secondary | ICD-10-CM

## 2022-08-15 MED ORDER — LANTUS SOLOSTAR 100 UNIT/ML ~~LOC~~ SOPN
12.0000 [IU] | PEN_INJECTOR | Freq: Every day | SUBCUTANEOUS | 2 refills | Status: DC
Start: 2022-08-15 — End: 2022-09-11
  Filled 2022-08-15: qty 15, 125d supply, fill #0
  Filled 2022-08-15: qty 9, 75d supply, fill #0

## 2022-08-15 MED ORDER — LANTUS SOLOSTAR 100 UNIT/ML ~~LOC~~ SOPN
8.0000 [IU] | PEN_INJECTOR | Freq: Every day | SUBCUTANEOUS | 0 refills | Status: DC
Start: 2022-08-15 — End: 2022-08-15
  Filled 2022-08-15: qty 15, 93d supply, fill #0

## 2022-08-15 MED ORDER — CLONAZEPAM 0.5 MG PO TABS
0.5000 mg | ORAL_TABLET | Freq: Every day | ORAL | 2 refills | Status: DC
Start: 2022-08-15 — End: 2022-08-30
  Filled 2022-08-15 – 2022-08-30 (×3): qty 30, 30d supply, fill #0

## 2022-08-15 NOTE — Telephone Encounter (Signed)
lmtcb

## 2022-08-15 NOTE — Telephone Encounter (Signed)
Clonazepam Prescription sent to pharmacy

## 2022-08-15 NOTE — Telephone Encounter (Signed)
Lantus and Klonopin Prescription sent to pharmacy

## 2022-08-15 NOTE — Telephone Encounter (Signed)
Patient calling back to check on medications. Please advise.

## 2022-08-15 NOTE — Telephone Encounter (Signed)
Pt wants to talk to nurse about clonazePAM (KLONOPIN) 0.5 MG tablet  and insulin glargine (LANTUS SOLOSTAR) 100 UNIT/ML Solostar Pen. She says that rx was messed up when she at the hospital. Please call back

## 2022-08-15 NOTE — Telephone Encounter (Signed)
Patient had a recent hospital stay and says that her refills are messed up. She is unable to fill her klonopin because the hospital cancelled her refills. She also is unable to fill her Lantus as well. She was started on Decadrom 4mg  TID and she said that this will increase her BS. She wants to know if you can give further instructions for her Lantus if her BS goes up. Needs both sent to [facility].

## 2022-08-16 ENCOUNTER — Telehealth: Payer: Self-pay | Admitting: Medical Oncology

## 2022-08-16 NOTE — Telephone Encounter (Signed)
Reports 4 episodes of dizzy spells in the past 3-4 days , especially if she looks up. She said "it may be vertigo , my mother has it". Her speech has improved since I talked to her 2 weeks ago.   She denies headache, vision problems, arm  weakness , N/V, no searching for words.   Seen in ED 4 days ago for a fall and "aphasia" . Dr.  Barbaraann Cao consulted and he increased her decadron from BID to  4 mg tid.   I instructed her to avoid looking up and lay down if she has dizzy spells.  I instructed her  to return to ED for new neuro symptoms/concerns. She voiced understanding.  Carolyn Hooper said her appt with Barbaraann Cao is supposed to be moved up from June 24 .   Message sent to Dr. Barbaraann Cao to advise.

## 2022-08-19 ENCOUNTER — Telehealth: Payer: Self-pay | Admitting: *Deleted

## 2022-08-19 ENCOUNTER — Other Ambulatory Visit (HOSPITAL_COMMUNITY): Payer: Self-pay

## 2022-08-19 ENCOUNTER — Telehealth: Payer: Self-pay | Admitting: Internal Medicine

## 2022-08-19 MED ORDER — DEXAMETHASONE 4 MG PO TABS
4.0000 mg | ORAL_TABLET | Freq: Two times a day (BID) | ORAL | 0 refills | Status: DC
  Filled 2022-08-19: qty 30, 15d supply, fill #0

## 2022-08-19 NOTE — Telephone Encounter (Signed)
Pt left a message stating she needs a refill of dexamethasone, states she is now taking it 3 times per day and will run out tomorrow.

## 2022-08-19 NOTE — Telephone Encounter (Signed)
Scheduled per scheduling message, patient has been called and notified. 

## 2022-08-19 NOTE — Telephone Encounter (Signed)
Instructed to take Dexamethasone 4 mg twice a day.

## 2022-08-20 ENCOUNTER — Other Ambulatory Visit (HOSPITAL_COMMUNITY): Payer: Self-pay

## 2022-08-20 ENCOUNTER — Other Ambulatory Visit: Payer: Self-pay

## 2022-08-22 ENCOUNTER — Other Ambulatory Visit (HOSPITAL_COMMUNITY): Payer: Self-pay

## 2022-08-22 ENCOUNTER — Inpatient Hospital Stay: Payer: BC Managed Care – PPO | Attending: Physician Assistant | Admitting: Internal Medicine

## 2022-08-22 ENCOUNTER — Telehealth: Payer: Self-pay | Admitting: Medical Oncology

## 2022-08-22 ENCOUNTER — Other Ambulatory Visit: Payer: Self-pay | Admitting: Medical Oncology

## 2022-08-22 VITALS — BP 130/71 | HR 73 | Temp 98.2°F | Resp 18 | Ht 68.0 in | Wt 221.4 lb

## 2022-08-22 DIAGNOSIS — C3432 Malignant neoplasm of lower lobe, left bronchus or lung: Secondary | ICD-10-CM | POA: Insufficient documentation

## 2022-08-22 DIAGNOSIS — Z5112 Encounter for antineoplastic immunotherapy: Secondary | ICD-10-CM | POA: Insufficient documentation

## 2022-08-22 DIAGNOSIS — Z5111 Encounter for antineoplastic chemotherapy: Secondary | ICD-10-CM | POA: Insufficient documentation

## 2022-08-22 DIAGNOSIS — R569 Unspecified convulsions: Secondary | ICD-10-CM | POA: Insufficient documentation

## 2022-08-22 DIAGNOSIS — C7931 Secondary malignant neoplasm of brain: Secondary | ICD-10-CM | POA: Diagnosis not present

## 2022-08-22 DIAGNOSIS — R12 Heartburn: Secondary | ICD-10-CM

## 2022-08-22 DIAGNOSIS — Z79899 Other long term (current) drug therapy: Secondary | ICD-10-CM | POA: Diagnosis not present

## 2022-08-22 DIAGNOSIS — M8580 Other specified disorders of bone density and structure, unspecified site: Secondary | ICD-10-CM | POA: Diagnosis not present

## 2022-08-22 DIAGNOSIS — R42 Dizziness and giddiness: Secondary | ICD-10-CM | POA: Diagnosis not present

## 2022-08-22 DIAGNOSIS — I10 Essential (primary) hypertension: Secondary | ICD-10-CM | POA: Diagnosis not present

## 2022-08-22 DIAGNOSIS — C3490 Malignant neoplasm of unspecified part of unspecified bronchus or lung: Secondary | ICD-10-CM

## 2022-08-22 MED ORDER — FAMOTIDINE 20 MG PO TABS
20.0000 mg | ORAL_TABLET | Freq: Two times a day (BID) | ORAL | 0 refills | Status: DC
Start: 2022-08-22 — End: 2022-11-04
  Filled 2022-08-22: qty 30, 15d supply, fill #0

## 2022-08-22 MED ORDER — DEXAMETHASONE 1 MG PO TABS
ORAL_TABLET | ORAL | 0 refills | Status: DC
  Filled 2022-08-22: qty 42, 21d supply, fill #0

## 2022-08-22 NOTE — Telephone Encounter (Signed)
Pt notified to take pepcid 20 mg bid per Dr. Arbutus Ped.

## 2022-08-22 NOTE — Telephone Encounter (Addendum)
R upper -mid abd pain 2/10 - denies, fever, light colored stool or dark urine. If she lays on her R. side the pain is worse. She repositions to L side and pain intensity is less. Pain associated with nausea at times. No vomiting.   Heartburn worse supine . When she burps she has burning and ? bile comes up. She took pantoprozole in the past and it made her nauseated.   She requests something for heartburn.  Message sent to [facility].   Appt with Vaslow today at 1530.

## 2022-08-22 NOTE — Progress Notes (Signed)
[facility] at [facility] [address]  [place], [place] [address] [phone]   Interval Evaluation  Date of Service: 08/22/22 Patient Name: Carolyn Hooper Patient MRN: [id number] Patient DOB: 1960/07/20 Provider: [name], MD  Identifying Statement:  Carolyn Hooper is a 62 y.o. 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  Describes episodes of sudden "room spinning, usually while standing" that resolves after 1 minute.  There are no associated neurologic deficits aside from the vertigo itself.  Currently dosing decadron 4mg  three times per day which has not improved the vertigo.  She otherwise describes ongoing issues with expressive language.  Mild tremors continue to involve the right hand.  Otherwise denies seizures, headaches.  H+P (03/07/22) Patient presented to neurologic attention on 02/11/22 with episode of sudden inability to speak. This resolved after some hours, she returned to prior baseline.  CNS imaging demonstrated lesions in both frontal lobes c/w metastatic disease, given concurrent lung mass.    Medications: Current Outpatient Medications on File Prior to Visit  Medication Sig Dispense Refill   albuterol (VENTOLIN HFA) 108 (90 Base) MCG/ACT inhaler Inhale 2 puffs into the lungs every 6 (six) hours as needed. 18 g 2   clonazePAM (KLONOPIN) 0.5 MG tablet Take 1  tablet (0.5 mg total) by mouth at bedtime. 30 tablet 2   dexamethasone (DECADRON) 4 MG tablet Take 1 tablet (4 mg total) by mouth 2 (two) times daily with a meal. 30 tablet 0   fluconazole (DIFLUCAN) 150 MG tablet Take 1 tablet (150 mg total) by mouth every three (3) days as needed (yeast infection). 3 tablet 0   folic acid (FOLVITE) 1 MG tablet Take 1 tablet (1 mg total) by mouth daily. 30 tablet 1   HYDROcodone-acetaminophen (NORCO) 5-325 MG tablet Take 1 - 2 tablets by mouth every 6 (six) hours as needed for moderate pain. 30 tablet 0   insulin glargine (LANTUS SOLOSTAR) 100 UNIT/ML Solostar Pen Inject 12 Units into the skin daily. 15 mL 2   Insulin Pen Needle (PEN NEEDLES) 32G X 4 MM MISC Use to inject insulin daily as directed. 100 each 11   levETIRAcetam (KEPPRA) 1000 MG tablet Take 1 tablet (1,000 mg total) by mouth 2 (two) times daily. 60 tablet 2   lidocaine (XYLOCAINE) 5 % ointment Apply topically as needed. 60 g 1   lidocaine-prilocaine (EMLA) cream Apply to port prior to access 30 g 0   metFORMIN (GLUCOPHAGE) 1000 MG tablet Take 1 tablet (1,000 mg total) by mouth 2 (two) times daily. 180 tablet 1   ondansetron (ZOFRAN) 4 MG tablet Take 1 tablet (4 mg total) by mouth every 6 (six) hours as needed for nausea. (Patient taking differently: Take 4-8 mg by mouth every 6 (six) hours as needed for nausea.) 30 tablet 2   prochlorperazine (COMPAZINE) 10 MG tablet Take 1 tablet (10 mg total) by  mouth every 6 (six) hours as needed. 30 tablet 2   triamcinolone ointment (KENALOG) 0.5 % Apply topically to affected area twice daily as directed, 30 g 0   No current facility-administered medications on file prior to visit.    Allergies:  Allergies  Allergen Reactions   Adhesive [Tape] Dermatitis   Statins Other (See Comments)    Myalgia, itching   Past Medical History:  Past Medical History:  Diagnosis Date   Abscess    hydroadenitis superativa   Anxiety    DM (diabetes mellitus) (HCC)     Hyperinsulinemia    Hypertension 1983   gestational - pre-eclampsia   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[facility])    Neuroma    audiotry neuroma   Seizure (HCC)    Vertigo    Past Surgical History:  Past Surgical History:  Procedure Laterality Date   ABDOMINAL HYSTERECTOMY  2006   remaining left ovary   BRONCHIAL BIOPSY  02/19/2022   Procedure: BRONCHIAL BIOPSIES;  Surgeon: Josephine Igo, DO;  Location: [facility];  Service: Pulmonary;;   BRONCHIAL BRUSHINGS  02/19/2022   Procedure: BRONCHIAL BRUSHINGS;  Surgeon: Josephine Igo, DO;  Location: [facility];  Service: Pulmonary;;   BRONCHIAL NEEDLE ASPIRATION BIOPSY  02/19/2022   Procedure: BRONCHIAL NEEDLE ASPIRATION BIOPSIES;  Surgeon: Josephine Igo, DO;  Location: [facility];  Service: Pulmonary;;   IR IMAGING GUIDED PORT INSERTION  03/29/2022   SHOULDER SURGERY Right 1960s   Social History:  Social History   Socioeconomic History   Marital status: Divorced    Spouse name: Not on file   Number of children: Not on file   Years of education: Not on file   Highest education level: Not on file  Occupational History   Not on file  Tobacco Use   Smoking status: Former    Packs/day: .75    Types: Cigarettes    Quit date: 02/11/2022    Years since quitting: 0.5   Smokeless tobacco: Never  Vaping Use   Vaping Use: Never used  Substance and Sexual Activity   Alcohol use: Yes    Comment: very rare   Drug use: No   Sexual activity: Not on file  Other Topics Concern   Not on file  Social History Narrative   Not on file   Social Determinants of Health   Financial Resource Strain: Not on file  Food Insecurity: No Food Insecurity (07/26/2022)   Hunger Vital Sign    Worried About Running Out of Food in the Last Year: Never true    Ran Out of Food in the Last Year: Never true  Transportation Needs: No Transportation Needs (07/26/2022)   PRAPARE - Administrator, Civil Service (Medical): No    Lack of  Transportation (Non-Medical): No  Physical Activity: Not on file  Stress: Not on file  Social Connections: Not on file  Intimate Partner Violence: Not At Risk (07/26/2022)   Humiliation, Afraid, Rape, and Kick questionnaire    Fear of Current or Ex-Partner: No    Emotionally Abused: No    Physically Abused: No    Sexually Abused: No   Family History:  Family History  Problem Relation Age of Onset   COPD Mother    Heart disease Father    Diabetes Father    Kidney disease Father    Arthritis Father        RA   Liver disease Father     Review of Systems:  Constitutional: Doesn't report fevers, chills or abnormal weight loss Eyes: Doesn't report blurriness of vision Ears, nose, mouth, throat, and face: Doesn't report sore throat Respiratory: Doesn't report cough, dyspnea or wheezes Cardiovascular: Doesn't report palpitation, chest discomfort  Gastrointestinal:  Doesn't report nausea, constipation, diarrhea GU: Doesn't report incontinence Skin: Doesn't report skin rashes Neurological: Per HPI Musculoskeletal: Doesn't report joint pain Behavioral/Psych: Doesn't report anxiety  Physical Exam: Vitals:   08/22/22 1543  BP: 130/71  Pulse: 73  Resp: 18  Temp: 98.2 F (36.8 C)  SpO2: 100%    KPS: 80. General: Alert, cooperative, pleasant, in no acute distress Head: Normal EENT: No conjunctival injection or scleral icterus.  Lungs: Resp effort normal Cardiac: Regular rate Abdomen: Non-distended abdomen Skin: No rashes cyanosis or petechiae. Extremities: No clubbing or edema  Neurologic Exam: Mental Status: Awake, alert, attentive to examiner. Oriented to self and environment. Language is fluent with intact comprehension.  Cranial Nerves: Visual acuity is grossly normal. Visual fields are full. Extra-ocular movements intact. No ptosis. Face is symmetric Motor: Tone and bulk are normal. Power is full in both arms and legs. Reflexes are symmetric, no pathologic reflexes  present.  Sensory: Intact to light touch Gait: Normal.   Labs: I have reviewed the data as listed    Component Value Date/Time   NA 137 08/14/2022 1116   NA 141 07/12/2022 1114   K 4.6 08/14/2022 1116   CL 100 08/14/2022 1116   CO2 27 08/14/2022 1116   GLUCOSE 246 (H) 08/14/2022 1116   BUN 18 08/14/2022 1116   BUN 11 07/12/2022 1114   CREATININE 0.80 08/14/2022 1116   CALCIUM 9.5 08/14/2022 1116   PROT 6.8 08/14/2022 1116   PROT 5.9 (L) 07/12/2022 1114   ALBUMIN 3.7 08/14/2022 1116   ALBUMIN 3.8 (L) 07/12/2022 1114   AST 37 08/14/2022 1116   ALT 61 (H) 08/14/2022 1116   ALKPHOS 57 08/14/2022 1116   BILITOT 0.7 08/14/2022 1116   GFRNONAA >60 08/14/2022 1116   GFRAA 93 09/21/2019 0807   Lab Results  Component Value Date   WBC 8.7 08/14/2022   NEUTROABS 6.7 08/14/2022   HGB 12.2 08/14/2022   HCT 36.8 08/14/2022   MCV 100.5 (H) 08/14/2022   PLT 231 08/14/2022   Imaging:  [facility] Clinician Interpretation: I have personally reviewed the CNS images as listed.  My interpretation, in the context of the patient's clinical presentation, is likely treatment effect  CT Head Wo Contrast  Result Date: 08/13/2022 CLINICAL DATA:  Facial trauma EXAM: CT HEAD WITHOUT CONTRAST CT MAXILLOFACIAL WITHOUT CONTRAST TECHNIQUE: Multidetector CT imaging of the head and maxillofacial structures were performed using the standard protocol without intravenous contrast. Multiplanar CT image reconstructions of the maxillofacial structures were also generated. RADIATION DOSE REDUCTION: This exam was performed according to the departmental dose-optimization program which includes automated exposure control, adjustment of the mA and/or kV according to patient size and/or use of iterative reconstruction technique. COMPARISON:  Brain MRI and head CT 08/07/2022 FINDINGS: CT HEAD FINDINGS Brain: There is no acute intracranial hemorrhage, extra-axial fluid collection, or acute infarct. Background parenchymal volume  is normal. The ventricles are normal in size. There is edema in the left precentral gyrus related to the known metastatic lesion in this location common better evaluated on the recent brain MRI from 08/07/2022. The extent of edema is not significantly changed. There is no significant mass effect or midline shift. There is no other parenchymal edema. The punctate lesion in the right frontal lobe  and right vestibular schwannoma seen on the prior MRI are not well seen on the current study. Gray-white differentiation is preserved. The pituitary and suprasellar region are normal. Vascular: No hyperdense vessel or unexpected calcification. Skull: Normal. Negative for fracture or focal lesion. Other: None. CT MAXILLOFACIAL FINDINGS Osseous: There is no acute facial bone fracture. There is no evidence of mandibular dislocation. There is no suspicious osseous lesion. Orbits: The globes and orbits are unremarkable. There is no retrobulbar hematoma or proptosis. Sinuses: There is mild mucosal thickening in the paranasal sinuses. Soft tissues: Unremarkable. IMPRESSION: 1. No acute intracranial pathology. 2. Stable edema in the left precentral gyrus related to the known metastatic lesion. No significant mass effect or midline shift. 3. No acute facial bone fracture. Electronically Signed   By: Lesia Hausen M.D.   On: 08/13/2022 13:13   CT Maxillofacial Wo Contrast  Result Date: 08/13/2022 CLINICAL DATA:  Facial trauma EXAM: CT HEAD WITHOUT CONTRAST CT MAXILLOFACIAL WITHOUT CONTRAST TECHNIQUE: Multidetector CT imaging of the head and maxillofacial structures were performed using the standard protocol without intravenous contrast. Multiplanar CT image reconstructions of the maxillofacial structures were also generated. RADIATION DOSE REDUCTION: This exam was performed according to the departmental dose-optimization program which includes automated exposure control, adjustment of the mA and/or kV according to patient size  and/or use of iterative reconstruction technique. COMPARISON:  Brain MRI and head CT 08/07/2022 FINDINGS: CT HEAD FINDINGS Brain: There is no acute intracranial hemorrhage, extra-axial fluid collection, or acute infarct. Background parenchymal volume is normal. The ventricles are normal in size. There is edema in the left precentral gyrus related to the known metastatic lesion in this location common better evaluated on the recent brain MRI from 08/07/2022. The extent of edema is not significantly changed. There is no significant mass effect or midline shift. There is no other parenchymal edema. The punctate lesion in the right frontal lobe and right vestibular schwannoma seen on the prior MRI are not well seen on the current study. Gray-white differentiation is preserved. The pituitary and suprasellar region are normal. Vascular: No hyperdense vessel or unexpected calcification. Skull: Normal. Negative for fracture or focal lesion. Other: None. CT MAXILLOFACIAL FINDINGS Osseous: There is no acute facial bone fracture. There is no evidence of mandibular dislocation. There is no suspicious osseous lesion. Orbits: The globes and orbits are unremarkable. There is no retrobulbar hematoma or proptosis. Sinuses: There is mild mucosal thickening in the paranasal sinuses. Soft tissues: Unremarkable. IMPRESSION: 1. No acute intracranial pathology. 2. Stable edema in the left precentral gyrus related to the known metastatic lesion. No significant mass effect or midline shift. 3. No acute facial bone fracture. Electronically Signed   By: Lesia Hausen M.D.   On: 08/13/2022 13:13   DG Elbow Complete Left  Result Date: 08/13/2022 CLINICAL DATA:  Pain after fall EXAM: LEFT ELBOW - COMPLETE 4 VIEW COMPARISON:  None Available. FINDINGS: Hyperostosis along the distal humerus. No acute fracture or dislocation. Preserved joint spaces and bone mineralization. No joint effusion on lateral view. Mild dorsal soft tissue swelling about  the olecranon. IMPRESSION: Mild dorsal soft tissue swelling about the olecranon. Please correlate for soft tissue thickening or abnormal bursa. Electronically Signed   By: Karen Kays M.D.   On: 08/13/2022 12:44   DG Hand Complete Left  Result Date: 08/13/2022 CLINICAL DATA:  Pain after fall EXAM: LEFT HAND - COMPLETE 3 VIEW COMPARISON:  None Available. FINDINGS: No fracture or dislocation. Preserved joint spaces and bone mineralization. IMPRESSION: No  acute osseous abnormality. Electronically Signed   By: Karen Kays M.D.   On: 08/13/2022 12:43   DG Chest 2 View  Result Date: 08/13/2022 CLINICAL DATA:  Cough. Fall 2 days ago. History of brain neoplasm and lung cancer EXAM: CHEST - 2 VIEW COMPARISON:  Chest x-ray one view 07/26/2018 FINDINGS: Stable right IJ chest port with the tip near the SVC right atrial junction. Mass lesion again seen in the medial left lung base. Please correlate with prior CT scan. Small right-sided midlung 11 mm nodule as well. No pneumothorax, effusion or edema. Normal cardiopericardial silhouette. Degenerative changes of the spine. IMPRESSION: Stable mass lesions at the left lung base and right midlung. Chest port. No pneumothorax or edema. Electronically Signed   By: Karen Kays M.D.   On: 08/13/2022 12:42   MR Brain W and Wo Contrast  Result Date: 08/07/2022 CLINICAL DATA:  Brain metastases.  Assess post treatment response. EXAM: MRI HEAD WITHOUT AND WITH CONTRAST TECHNIQUE: Multiplanar, multiecho pulse sequences of the brain and surrounding structures were obtained without and with intravenous contrast. CONTRAST:  10mL GADAVIST GADOBUTROL 1 MMOL/ML IV SOLN COMPARISON:  06/06/2022 FINDINGS: Brain: No acute infarct, mass effect or extra-axial collection. No acute or chronic hemorrhage. Left precentral gyrus mass measures 1.6 x 1.4 x 1.4 cm, unchanged allowing for subtle differences in slice selection. The degree of surrounding edema has worsened. The previously seen  enhancing nodule of the paramedian right frontal lobe is decreased in size and is now punctate (series 16, image 13). The midline structures are normal. Unchanged 4 mm right vestibular schwannoma. Vascular: Major flow voids are preserved. Skull and upper cervical spine: Normal calvarium and skull base. Visualized upper cervical spine and soft tissues are normal. Sinuses/Orbits:No paranasal sinus fluid levels or advanced mucosal thickening. No mastoid or middle ear effusion. Normal orbits. IMPRESSION: 1. Unchanged size of left precentral gyrus mass with worsened surrounding edema. 2. Decreased size of previously seen enhancing nodule of the paramedian right frontal lobe, now punctate. 3. No new lesions. 4. Unchanged 4 mm right vestibular schwannoma. Electronically Signed   By: Deatra Robinson M.D.   On: 08/07/2022 19:43   CT Head Wo Contrast  Result Date: 08/07/2022 CLINICAL DATA:  Difficulty speaking history of lung cancer EXAM: CT HEAD WITHOUT CONTRAST TECHNIQUE: Contiguous axial images were obtained from the base of the skull through the vertex without intravenous contrast. RADIATION DOSE REDUCTION: This exam was performed according to the departmental dose-optimization program which includes automated exposure control, adjustment of the mA and/or kV according to patient size and/or use of iterative reconstruction technique. COMPARISON:  Brain MRI 06/06/2022, head CT 07/16/2022 FINDINGS: Brain: No hemorrhage is visualized. Slight increased edema within the left frontal white matter corresponding to history of known metastatic lesion. No significant mass effect or midline shift. Smaller MRI demonstrated lesion near the right vertex is not well seen on CT. Stable ventricle size Vascular: No hyperdense vessels.  Carotid vascular calcification Skull: Normal. Negative for fracture or focal lesion. Sinuses/Orbits: No acute finding.  Patchy mucosal thickening Other: None IMPRESSION: 1. Negative for acute intracranial  hemorrhage. 2. Slight increased edema/hypodensity within the left frontal white since head CT 07/16/2022 matter corresponding to history of known metastatic disease. No significant mass effect or midline shift. Electronically Signed   By: Jasmine Pang M.D.   On: 08/07/2022 16:59   NM Hepatobiliary Liver Func  Result Date: 07/29/2022 CLINICAL DATA:  Evaluate for cholecystitis. EXAM: NUCLEAR MEDICINE HEPATOBILIARY IMAGING TECHNIQUE: Sequential images of  the abdomen were obtained out to 60 minutes following intravenous administration of radiopharmaceutical. RADIOPHARMACEUTICALS:  5.2 mCi Tc-75m  Choletec IV COMPARISON:  CT AP 07/27/2022 FINDINGS: Prompt uptake and biliary excretion of activity by the liver is seen. Biliary activity passes into small bowel, consistent with patent common bile duct. After 1 hour of imaging there was no gallbladder activity visualized. The patient subsequently received 3 mg of morphine ET and additional imaging was carried out for 30 minutes without visualization of the gallbladder. IMPRESSION: 1. Nonvisualization of the gallbladder consistent with cystic duct obstruction and acute cholecystitis. 2. Patent common bile duct with normal biliary to bowel transit. Electronically Signed   By: Signa Kell M.D.   On: 07/29/2022 13:30   CT ABDOMEN PELVIS W CONTRAST  Result Date: 07/27/2022 CLINICAL DATA:  Left lower quadrant pain with diarrhea, history of lung carcinoma, initial encounter EXAM: CT ABDOMEN AND PELVIS WITH CONTRAST TECHNIQUE: Multidetector CT imaging of the abdomen and pelvis was performed using the standard protocol following bolus administration of intravenous contrast. RADIATION DOSE REDUCTION: This exam was performed according to the departmental dose-optimization program which includes automated exposure control, adjustment of the mA and/or kV according to patient size and/or use of iterative reconstruction technique. CONTRAST:  OMNIPAQUE IOHEXOL 300 MG/ML   SOLN COMPARISON:  07/15/2022 FINDINGS: Lower chest: Right lung base is clear. Left lung base again demonstrates a medial soft tissue mass which measures 5.6 x 4.2 cm in greatest AP and transverse dimensions respectively. This is relatively stable from the recent exam. Small left effusion is noted slightly increased when compared with the prior exam. Hepatobiliary: Fatty infiltration of the liver is noted. Gallbladder is well distended with vicarious excretion of contrast material as well as a large lamellated gallstone. Area of focal fatty infiltration is noted within the liver. This is stable from the prior study. Pancreas: Hypodensity is again noted in the head of the pancreas measuring approximately 7 mm stable in appearance from the prior exam. It is somewhat less well visualized on the current exam. Spleen: Normal in size without focal abnormality. Adrenals/Urinary Tract: Adrenal glands are within normal limits. Kidneys demonstrate a normal enhancement pattern bilaterally. No renal calculi or obstructive changes are seen. The bladder is within normal limits. Stomach/Bowel: Scattered diverticular change of the colon is noted without evidence of diverticulitis. The appendix is not well visualized and may have been surgically removed. No inflammatory changes are seen. The small bowel and stomach are within normal limits. Vascular/Lymphatic: Aortic atherosclerosis. No enlarged abdominal or pelvic lymph nodes. Small periaortic and intra-aortocaval nodes are seen similar to that seen on prior exam. Reproductive: Status post hysterectomy. No adnexal masses. Other: No abdominal wall hernia or abnormality. No abdominopelvic ascites. Musculoskeletal: Degenerative changes of lumbar spine are noted. No acute abnormality is seen. IMPRESSION: Stable appearing left lower lobe mass lesion consistent with the known clinical history. Fatty infiltration of the liver. Cholelithiasis without complicating factors. Stable cystic  appearing lesion in the head of the pancreas. This can be followed on routine oncology follow-up imaging. Diverticulosis without diverticulitis. Electronically Signed   By: Alcide Clever M.D.   On: 07/27/2022 21:19   US Abdomen Limited RUQ (LIVER/GB)  Result Date: 07/26/2022 CLINICAL DATA:  Fever EXAM: ULTRASOUND ABDOMEN LIMITED RIGHT UPPER QUADRANT COMPARISON:  Ultrasound 07/16/2022, CT 07/15/2022 FINDINGS: Gallbladder: Distended gallbladder. Large shadowing stone within the proximal lumen measure up to 4.4 cm. Normal wall thickness. Positive sonographic Murphy. Sludge filled remaining gallbladder Common bile duct: Diameter: 5.6 mm  Liver: Poorly visualized. Diffuse heterogeneous echogenic parenchyma. Portal vein is patent on color Doppler imaging with normal direction of blood flow towards the liver. Other: None. IMPRESSION: 1. Distended gallbladder with large shadowing stone and sludge. Positive sonographic Murphy's sign. Findings are suspicious for acute cholecystitis in the appropriate clinical setting. 2. Heterogeneous echogenic liver parenchyma consistent with hepatic steatosis and/or hepatocellular disease. Electronically Signed   By: Jasmine Pang M.D.   On: 07/26/2022 16:16   DG Chest Port 1 View  Result Date: 07/26/2022 CLINICAL DATA:  Cough, history non-small cell lung cancer EXAM: PORTABLE CHEST 1 VIEW COMPARISON:  Portable exam 1315 hours compared to 07/16/2022 and correlated with CT chest of 07/15/2022 FINDINGS: RIGHT jugular Port-A-Cath with tip projecting over SVC. Upper normal heart size. Mediastinal contours and pulmonary vascularity normal. Atherosclerotic calcification aorta. Lungs clear. No pulmonary infiltrate, pleural effusion, or pneumothorax. Nodular density 11 mm diameter projecting over the lateral RIGHT upper lobe corresponds to a pulmonary nodule seen on recent CT, question metastasis. IMPRESSION: 11 mm pulmonary nodule RIGHT upper lobe consistent with metastatic disease. Aortic  Atherosclerosis (ICD10-I70.0). Electronically Signed   By: Ulyses Southward M.D.   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  Etiology is small and stable schwannoma.    Meclizine has not helped in the past.  We did discuss and recommend vestibular rehab, which she was agreeable to.    MRI and subsequent CT head also demonstrate inflammatory changes within treated left frontal metastasis without frank tumor progression.  Decadron should decrease to 4mg  daily x 1 weeks, then decrease by 1mg  daily each week until stopped.  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  Keppra will con't 500mg  BID for now.    We appreciate the opportunity to participate in the care of Carolyn Hooper.   We ask that Carolyn Hooper return to clinic in 2 months following next brain MRI, or sooner as needed.  All questions were answered. The patient knows to call the clinic with any problems, questions or concerns. No barriers to learning were detected.  The total time spent in the encounter was 40 minutes and more than 50% was on counseling and review of test results   [name], MD Medical Director of Neuro-Oncology [facility] at [facility] 08/22/22 3:45 PM

## 2022-08-23 ENCOUNTER — Telehealth: Payer: Self-pay | Admitting: Medical Oncology

## 2022-08-23 ENCOUNTER — Other Ambulatory Visit (HOSPITAL_COMMUNITY): Payer: Self-pay

## 2022-08-23 MED ORDER — FLUCONAZOLE 150 MG PO TABS
150.0000 mg | ORAL_TABLET | ORAL | 0 refills | Status: DC | PRN
  Filled 2022-08-23: qty 3, 9d supply, fill #0

## 2022-08-23 NOTE — Telephone Encounter (Signed)
Recurrent vaginal symptoms described as itching , burning. Pt is requesting Diflucan. Spoke to [facility] and requested her to manage symptoms. She said she will message C. Lendon Colonel, NP.

## 2022-08-23 NOTE — Telephone Encounter (Signed)
Diflucan Prescription sent to pharmacy   

## 2022-08-23 NOTE — Telephone Encounter (Signed)
Left detailed message per DPR. Told to call back if had any questions or concerns.

## 2022-08-24 ENCOUNTER — Other Ambulatory Visit: Payer: Self-pay

## 2022-08-25 ENCOUNTER — Other Ambulatory Visit: Payer: Self-pay

## 2022-08-25 ENCOUNTER — Emergency Department (HOSPITAL_COMMUNITY)
Admission: EM | Admit: 2022-08-25 | Discharge: 2022-08-25 | Disposition: A | Discharge status: Home / Self Care | Payer: BC Managed Care – PPO | Attending: Emergency Medicine | Admitting: Emergency Medicine

## 2022-08-25 DIAGNOSIS — C3490 Malignant neoplasm of unspecified part of unspecified bronchus or lung: Secondary | ICD-10-CM | POA: Diagnosis not present

## 2022-08-25 DIAGNOSIS — C7931 Secondary malignant neoplasm of brain: Secondary | ICD-10-CM | POA: Insufficient documentation

## 2022-08-25 DIAGNOSIS — E1165 Type 2 diabetes mellitus with hyperglycemia: Secondary | ICD-10-CM | POA: Insufficient documentation

## 2022-08-25 DIAGNOSIS — I10 Essential (primary) hypertension: Secondary | ICD-10-CM | POA: Diagnosis not present

## 2022-08-25 DIAGNOSIS — R739 Hyperglycemia, unspecified: Secondary | ICD-10-CM | POA: Diagnosis not present

## 2022-08-25 DIAGNOSIS — Z7984 Long term (current) use of oral hypoglycemic drugs: Secondary | ICD-10-CM | POA: Insufficient documentation

## 2022-08-25 DIAGNOSIS — Z794 Long term (current) use of insulin: Secondary | ICD-10-CM | POA: Insufficient documentation

## 2022-08-25 LAB — CBC WITH DIFFERENTIAL/PLATELET
Abs Immature Granulocytes: 0.09 10*3/uL — ABNORMAL HIGH (ref 0.00–0.07)
Basophils Absolute: 0 10*3/uL (ref 0.0–0.1)
Basophils Relative: 0 %
Eosinophils Absolute: 0 10*3/uL (ref 0.0–0.5)
Eosinophils Relative: 0 %
HCT: 34.6 % — ABNORMAL LOW (ref 36.0–46.0)
Hemoglobin: 11.5 g/dL — ABNORMAL LOW (ref 12.0–15.0)
Immature Granulocytes: 1 %
Lymphocytes Relative: 16 %
Lymphs Abs: 1.5 10*3/uL (ref 0.7–4.0)
MCH: 32.1 pg (ref 26.0–34.0)
MCHC: 33.2 g/dL (ref 30.0–36.0)
MCV: 96.6 fL (ref 80.0–100.0)
Monocytes Absolute: 1 10*3/uL (ref 0.1–1.0)
Monocytes Relative: 11 %
Neutro Abs: 6.8 10*3/uL (ref 1.7–7.7)
Neutrophils Relative %: 72 %
Platelets: 101 10*3/uL — ABNORMAL LOW (ref 150–400)
RBC: 3.58 MIL/uL — ABNORMAL LOW (ref 3.87–5.11)
RDW: 16.6 % — ABNORMAL HIGH (ref 11.5–15.5)
WBC: 9.4 10*3/uL (ref 4.0–10.5)
nRBC: 0 % (ref 0.0–0.2)

## 2022-08-25 LAB — BASIC METABOLIC PANEL
Anion gap: 11 (ref 5–15)
BUN: 29 mg/dL — ABNORMAL HIGH (ref 8–23)
CO2: 22 mmol/L (ref 22–32)
Calcium: 8.9 mg/dL (ref 8.9–10.3)
Chloride: 101 mmol/L (ref 98–111)
Creatinine, Ser: 1.02 mg/dL — ABNORMAL HIGH (ref 0.44–1.00)
GFR, Estimated: >60 mL/min (ref >60)
Glucose, Bld: 306 mg/dL — ABNORMAL HIGH (ref 70–99)
Potassium: 3.7 mmol/L (ref 3.5–5.1)
Sodium: 134 mmol/L — ABNORMAL LOW (ref 135–145)

## 2022-08-25 LAB — CBG MONITORING, ED: Glucose-Capillary: 334 mg/dL — ABNORMAL HIGH (ref 70–99)

## 2022-08-25 MED ORDER — LACTATED RINGERS IV SOLN: INTRAVENOUS | Status: DC

## 2022-08-25 MED ORDER — INSULIN ASPART 100 UNIT/ML IJ SOLN
10.0000 [IU] | Freq: Once | INTRAMUSCULAR | Status: AC
  Administered 2022-08-25: 10 [IU] via SUBCUTANEOUS
  Filled 2022-08-25: qty 0.1

## 2022-08-25 MED ORDER — LACTATED RINGERS IV BOLUS
1000.0000 mL | Freq: Once | INTRAVENOUS | Status: AC
  Administered 2022-08-25: 1000 mL via INTRAVENOUS

## 2022-08-25 MED ORDER — HEPARIN SOD (PORK) LOCK FLUSH 100 UNIT/ML IV SOLN
500.0000 [IU] | Freq: Once | INTRAVENOUS | Status: AC
  Administered 2022-08-25: 500 [IU]
  Filled 2022-08-25: qty 5

## 2022-08-25 NOTE — ED Triage Notes (Signed)
Pt arrived via [facility]. She was at her mom's home in [facility] when she called EMS, but patient lives in [place]. 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 She is currently on steroids and is a diabetic. Her sugar is elevated. It was 500 this afternoon. 323 with EMS.  BP 150/70 HR 68 O2 100RA CBG 323

## 2022-08-25 NOTE — ED Provider Notes (Signed)
[place] EMERGENCY DEPARTMENT AT [facility] Provider Note   CSN: [id number] Arrival date & time: 08/25/22  1615     History  Chief Complaint  Patient presents with   Hyperglycemia    Carolyn Hooper is a 62 y.o. female.  62 year old female with history of steroid-induced diabetes presents due to increased blood sugars at home.  Patient has not had a strict diabetic diet and had increased carbohydrates yesterday.  Has not had any polyuria or polydipsia.  No emesis.  No abdominal discomfort.  Patient is currently on Decadron due to history of brain mets from cancer.  She is going to start weaning these off soon.  She is on oral as well as insulin therapy.  Called EMS due to blood sugars at home running into the 500s.  Has been compliant with her diabetic medications       Home Medications Prior to Admission medications   Medication Sig Start Date End Date Taking? Authorizing Provider  albuterol (VENTOLIN HFA) 108 (90 Base) MCG/ACT inhaler Inhale 2 puffs into the lungs every 6 (six) hours as needed. 01/08/21   [name], FNP  clonazePAM (KLONOPIN) 0.5 MG tablet Take 1 tablet (0.5 mg total) by mouth at bedtime. 08/15/22   [name], FNP  dexamethasone (DECADRON) 1 MG tablet Take 3 tablets by mouth daily with breakfast for 7 days, THEN take 2 tablets daily for 7 days, THEN 1 tablet daily for 7 days. 08/29/22 09/19/22  [name], MD  famotidine (PEPCID) 20 MG tablet Take 1 tablet (20 mg total) by mouth 2 (two) times daily. 08/22/22   [name], MD  fluconazole (DIFLUCAN) 150 MG tablet Take 1 tablet by mouth every 3 days as needed (yeast infection). 08/23/22   [name], FNP  folic acid (FOLVITE) 1 MG tablet Take 1 tablet (1 mg total) by mouth daily. 05/26/22   [name], PA-C  HYDROcodone-acetaminophen (NORCO) 5-325 MG tablet Take 1 - 2 tablets by mouth every 6 (six) hours as needed for moderate pain. 05/30/22   [name], FNP  insulin  glargine (LANTUS SOLOSTAR) 100 UNIT/ML Solostar Pen Inject 12 Units into the skin daily. 08/15/22   [name], FNP  Insulin Pen Needle (PEN NEEDLES) 32G X 4 MM MISC Use to inject insulin daily as directed. 07/09/22   [name], FNP  levETIRAcetam (KEPPRA) 1000 MG tablet Take 1 tablet (1,000 mg total) by mouth 2 (two) times daily. 07/31/22   [name], MD  lidocaine (XYLOCAINE) 5 % ointment Apply topically as needed. 05/30/22   [name], FNP  lidocaine-prilocaine (EMLA) cream Apply to port prior to access 04/25/22   [name], MD  metFORMIN (GLUCOPHAGE) 1000 MG tablet Take 1 tablet (1,000 mg total) by mouth 2 (two) times daily. 05/10/22   [name], FNP  ondansetron (ZOFRAN) 4 MG tablet Take 1 tablet (4 mg total) by mouth every 6 (six) hours as needed for nausea. Patient taking differently: Take 4-8 mg by mouth every 6 (six) hours as needed for nausea. 07/17/22   [name], [name], MD  prochlorperazine (COMPAZINE) 10 MG tablet Take 1 tablet (10 mg total) by mouth every 6 (six) hours as needed. 05/30/22   Heilingoetter, [name], PA-C  triamcinolone ointment (KENALOG) 0.5 % Apply topically to affected area twice daily as directed, 07/30/22   [name], MD      Allergies    Adhesive [tape] and Statins  Review of Systems   Review of Systems  All other systems reviewed and are negative.   Physical Exam Updated Vital Signs Pulse (!) 59   Temp 98.5 F (36.9 C)   Resp 14   SpO2 100%  Physical Exam Vitals and nursing note reviewed.  Constitutional:      General: She is not in acute distress.    Appearance: Normal appearance. She is well-developed. She is not toxic-appearing.  HENT:     Head: Normocephalic and atraumatic.  Eyes:     General: Lids are normal.     Conjunctiva/sclera: Conjunctivae normal.     Pupils: Pupils are equal, round, and reactive to light.  Neck:     Thyroid: No thyroid mass.     Trachea: No tracheal deviation.   Cardiovascular:     Rate and Rhythm: Normal rate and regular rhythm.     Heart sounds: Normal heart sounds. No murmur heard.    No gallop.  Pulmonary:     Effort: Pulmonary effort is normal. No respiratory distress.     Breath sounds: Normal breath sounds. No stridor. No decreased breath sounds, wheezing, rhonchi or rales.  Abdominal:     General: There is no distension.     Palpations: Abdomen is soft.     Tenderness: There is no abdominal tenderness. There is no rebound.  Musculoskeletal:        General: No tenderness. Normal range of motion.     Cervical back: Normal range of motion and neck supple.  Skin:    General: Skin is warm and dry.     Findings: No abrasion or rash.  Neurological:     Mental Status: She is alert and oriented to person, place, and time. Mental status is at baseline.     GCS: GCS eye subscore is 4. GCS verbal subscore is 5. GCS motor subscore is 6.     Cranial Nerves: No cranial nerve deficit.     Sensory: No sensory deficit.     Motor: Motor function is intact.  Psychiatric:        Attention and Perception: Attention normal.        Speech: Speech normal.        Behavior: Behavior normal.     ED Results / Procedures / Treatments   Labs (all labs ordered are listed, but only abnormal results are displayed) Labs Reviewed  CBG MONITORING, ED - Abnormal; Notable for the following components:      Result Value   Glucose-Capillary 334 (*)    All other components within normal limits  CBC WITH DIFFERENTIAL/PLATELET  BASIC METABOLIC PANEL    EKG None  Radiology No results found.  Procedures Procedures    Medications Ordered in ED Medications  lactated ringers bolus 1,000 mL (has no administration in time range)  lactated ringers infusion (has no administration in time range)  insulin aspart (novoLOG) injection 10 Units (has no administration in time range)    ED Course/ Medical Decision Making/ A&P                             Medical  Decision Making Amount and/or Complexity of Data Reviewed Labs: ordered.  Risk Prescription drug management.   Given IV fluids for hyperglycemia here.  No evidence of DKA.  Blood sugar has improved.  She will follow-up in the cancer center for management of her hyperglycemia related to her use of Decadron  Final Clinical Impression(s) / ED Diagnoses Final diagnoses:  None    Rx / DC Orders ED Discharge Orders     None         Lorre Nick, MD 08/25/22 1921

## 2022-08-25 NOTE — Discharge Instructions (Signed)
Follow-up in the cancer center so they can adjust your diabetic medications.

## 2022-08-26 ENCOUNTER — Telehealth: Payer: Self-pay

## 2022-08-26 LAB — CBG MONITORING, ED: Glucose-Capillary: 194 mg/dL — ABNORMAL HIGH (ref 70–99)

## 2022-08-26 NOTE — Telephone Encounter (Signed)
This nurse received a call from this patient who stated that she went to the ED over the weekend due to her blood sugar being over 500.  She states that she was given insulin and was in the 100's when she was discharged.  She states that this morning she is in the 300's and she was told to follow up with her oncology.  This nurse advised patient that oncology provider will be made aware.  However she should also reach out to her primary care physician so that she can help manage her blood sugars.  Patient is taking steroids and elevated blood sugars are common with steroids.  Patient also states that her stool has changed color from brown to yellow.  Patient does deny pain and fever.  This nurse advised this patient that provider will be made aware.  No further questions or concerns noted at this time.

## 2022-08-27 ENCOUNTER — Telehealth: Payer: Self-pay | Admitting: Medical Oncology

## 2022-08-27 ENCOUNTER — Telehealth: Payer: Self-pay | Admitting: Family

## 2022-08-27 NOTE — Telephone Encounter (Signed)
Called and spoke with patient again on this she states she will just hold off for now because she has too much going on

## 2022-08-27 NOTE — Telephone Encounter (Signed)
ILVM if she can take protonix ( pantoprozole) for heart burn. I told her she can try maalox /tums for now.

## 2022-08-27 NOTE — Telephone Encounter (Signed)
Ok, thank you

## 2022-08-27 NOTE — Telephone Encounter (Signed)
Pepcid bid is not helping her heartburn and acid reflux. She was up all night with heartburn, gas bloating.

## 2022-08-28 ENCOUNTER — Other Ambulatory Visit: Payer: Self-pay | Admitting: Family

## 2022-08-28 ENCOUNTER — Other Ambulatory Visit: Payer: BC Managed Care – PPO

## 2022-08-28 ENCOUNTER — Ambulatory Visit: Payer: BC Managed Care – PPO | Admitting: Internal Medicine

## 2022-08-28 ENCOUNTER — Telehealth: Payer: Self-pay | Admitting: Medical Oncology

## 2022-08-28 ENCOUNTER — Other Ambulatory Visit (HOSPITAL_COMMUNITY): Payer: Self-pay

## 2022-08-28 ENCOUNTER — Ambulatory Visit: Payer: BC Managed Care – PPO

## 2022-08-28 NOTE — Telephone Encounter (Signed)
Heartburn -can she takes Mylanta and Pepcid  same day?

## 2022-08-29 ENCOUNTER — Other Ambulatory Visit (HOSPITAL_COMMUNITY): Payer: Self-pay

## 2022-08-29 ENCOUNTER — Telehealth: Payer: Self-pay

## 2022-08-29 ENCOUNTER — Ambulatory Visit: Payer: BC Managed Care – PPO | Attending: Internal Medicine | Admitting: Physical Therapy

## 2022-08-29 ENCOUNTER — Other Ambulatory Visit: Payer: Self-pay | Admitting: Family

## 2022-08-29 ENCOUNTER — Encounter: Payer: Self-pay | Admitting: Physical Therapy

## 2022-08-29 ENCOUNTER — Other Ambulatory Visit: Payer: Self-pay | Admitting: Internal Medicine

## 2022-08-29 DIAGNOSIS — R42 Dizziness and giddiness: Secondary | ICD-10-CM | POA: Insufficient documentation

## 2022-08-29 DIAGNOSIS — R262 Difficulty in walking, not elsewhere classified: Secondary | ICD-10-CM | POA: Insufficient documentation

## 2022-08-29 DIAGNOSIS — R2681 Unsteadiness on feet: Secondary | ICD-10-CM | POA: Diagnosis not present

## 2022-08-29 MED ORDER — FLUCONAZOLE 150 MG PO TABS
150.0000 mg | ORAL_TABLET | ORAL | 0 refills | Status: DC | PRN
  Filled 2022-08-29: qty 3, 9d supply, fill #0

## 2022-08-29 NOTE — Telephone Encounter (Signed)
Plese advise

## 2022-08-29 NOTE — Telephone Encounter (Signed)
  Prescription Request  08/29/2022  Is this a "Controlled Substance" medicine? diflucan  Have you seen your PCP in the last 2 weeks? no  If YES, route message to pool  -  If NO, patient needs to be scheduled for appointment.  What is the name of the medication or equipment? diflucan  Have you contacted your pharmacy to request a refill? Yes but was told to contact office   Which pharmacy would you like this sent to? [facility] out patient pharmacy  Pt needs the rx due to side effect due to her chemo therapy. If [name] does not refill, please let pt know so that she can let her oncologist know.   Patient notified that their request is being sent to the clinical staff for review and that they should receive a response within 2 business days.

## 2022-08-29 NOTE — Therapy (Signed)
OUTPATIENT PHYSICAL THERAPY VESTIBULAR EVALUATION     Patient Name: Carolyn Hooper MRN: [id number] DOB:December 11, 1960, 62 y.o., female Today's Date: 08/30/2022  END OF SESSION:  PT End of Session - 08/30/22 1551     Visit Number 1    Number of Visits 5   eval + 4 visits   Date for PT Re-Evaluation 10/04/22   pushed out 1 week due to primary PT on vacation   Authorization Type [employer]    Authorization Time Period 08-29-22 - 10-11-22    PT Start Time 1355    PT Stop Time 1445    PT Time Calculation (min) 50 min    Activity Tolerance Patient tolerated treatment well    Behavior During Therapy WFL for tasks assessed/performed             Past Medical History:  Diagnosis Date   Abscess    hydroadenitis superativa   Anxiety    DM (diabetes mellitus) (HCC)    Hyperinsulinemia    Hypertension 1983   gestational - pre-eclampsia   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(HCC)    Neuroma    audiotry neuroma   Seizure (HCC)    Vertigo    Past Surgical History:  Procedure Laterality Date   ABDOMINAL HYSTERECTOMY  2006   remaining left ovary   BRONCHIAL BIOPSY  02/19/2022   Procedure: BRONCHIAL BIOPSIES;  Surgeon: Josephine Igo, DO;  Location: [facility];  Service: Pulmonary;;   BRONCHIAL BRUSHINGS  02/19/2022   Procedure: BRONCHIAL BRUSHINGS;  Surgeon: Josephine Igo, DO;  Location: [facility];  Service: Pulmonary;;   BRONCHIAL NEEDLE ASPIRATION BIOPSY  02/19/2022   Procedure: BRONCHIAL NEEDLE ASPIRATION BIOPSIES;  Surgeon: Josephine Igo, DO;  Location: [facility];  Service: Pulmonary;;   IR IMAGING GUIDED PORT INSERTION  03/29/2022   SHOULDER SURGERY Right 1960s   Patient Active Problem List   Diagnosis Date Noted   Cholecystitis, acute 07/27/2022   Fever, unknown origin 07/27/2022   Lower abdominal pain 07/27/2022   Diarrhea 07/27/2022   Fever 07/26/2022   Cholelithiasis 07/16/2022   Primary insomnia 07/12/2022   Swelling 06/20/2022   Encounter for antineoplastic  chemotherapy 06/05/2022   Encounter for antineoplastic immunotherapy 06/05/2022   Port-A-Cath in place 04/24/2022   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) 03/07/2022   Focal seizure (HCC) 03/07/2022   Non-small cell lung cancer (HCC) 02/25/2022   Goals of care, counseling/discussion 02/25/2022   Lung mass 02/13/2022   Subarachnoid hemorrhage (HCC) 02/12/2022   Aphasia 02/12/2022   Brain mass 02/12/2022   Leukocytosis 02/12/2022   Mass of lower lobe of left lung 02/12/2022   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   Refusal of statin medication by patient 07/10/2020   Myalgia due to statin 07/10/2020   Controlled substance agreement signed 05/28/2018   Benzodiazepine dependence (HCC) 05/28/2018   Hyperlipidemia associated with type 2 diabetes mellitus (HCC) 04/11/2016   Morbid obesity (HCC) 04/11/2016   Former smoker 04/11/2016   Hidradenitis suppurativa 04/11/2016   GAD (generalized anxiety disorder) 08/19/2013   Uncontrolled type 2 diabetes mellitus with hyperglycemia, without long-term current use of insulin (HCC) 08/19/2013   Hyperinsulinemia 08/19/2013    PCP: [name]., FNP REFERRING PROVIDER: [name], MD  REFERRING DIAG: C34.90,C79.31 (ICD-10-CM) - 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) R42 (ICD-10-CM) - Vertigo  THERAPY DIAG:  Dizziness and giddiness - Plan: PT plan of care cert/re-cert  Difficulty in walking, not elsewhere classified - Plan: PT  plan of care cert/re-cert  Unsteadiness on feet - Plan: PT plan of care cert/re-cert  ONSET DATE: approx. 3 months ago for initial onset;  Referral date 08-22-22  Rationale for Evaluation and Treatment: Rehabilitation  SUBJECTIVE:   SUBJECTIVE STATEMENT: Pt reports she gets dizzy at times for no known reason - states she gets confused with why the episodes occur and where they come from; reports her son thinks it is more visual - "right angles may have something to do  with it"; triggers are not consistent.  Pt asks if she gets a trigger (never occurs when walking or sitting) how does she deal with it. Says she walks in [place] for 30" and may not have a problem but then gets home and puts groceries away and get dizzy.  Episodes lasts from seconds to 3-5" max.  Pt got dizzy when walking outside in parking lot going from car to [place] and got dizzy -  said she told her son "the sky is too high" -- had to look down at ground at her purse for approx. 2" - then when she returned to upright it was better but not resolved  - lasts for approx. 5" max; is able to recognize when episode is going to happen  Pt accompanied by: family member - mother   PERTINENT HISTORY:  unilateral (Rt) vestibular schwannoma diagnosed 13 yrs ago; lung cancer metastatic to brain, uncontrolled type 2 DM, h/o [facility] Nov. 2023, h/o seizure in Nov. 2023 that resulted in speech & cognitive changes  PAIN:  Are you having pain? No  PRECAUTIONS: None  WEIGHT BEARING RESTRICTIONS: No  FALLS: Has patient fallen in last 6 months? Yes. Number of falls 1 - fell face first on ground when walking from car to house during a storm - doesn't know why she fell  LIVING ENVIRONMENT: Lives with: lives with their family Lives in: House/apartment Stairs: No Has following equipment at home: Environmental consultant - 2 wheeled  PLOF: Independent - drive when she feels good - not when she has chemo  PATIENT GOALS: "retrain my brain" to know what to do when dizziness occurs  OBJECTIVE:   DIAGNOSTIC FINDINGS:  08-13-22 IMPRESSION: 1. No acute intracranial pathology. 2. Stable edema in the left precentral gyrus related to the known metastatic lesion. No significant mass effect or midline shift. 3. No acute facial bone fracture.  COGNITION: Overall cognitive status: Within functional limits for tasks assessed   SENSATION: WFL  Cervical ROM:  WFL's   GAIT: Gait pattern: WFL Distance walked: 41' Assistive device  utilized: None Level of assistance: Modified independence Comments: NO LOB during amb. During eval  PATIENT SURVEYS:  FOTO was not captured by front office at eval - will complete next session  VESTIBULAR ASSESSMENT:  GENERAL OBSERVATION: pt is a 62 yr old lady with episodic spontaneous dizziness of unknown etiology.  Pt reports it may be visually related - reports feeling "sky is too high" when walking in parking lot to get to [facility] and also has dizziness when walking through doors of cancer center - thinks it may be anxiety related   SYMPTOM BEHAVIOR:  Subjective history: pt reports symptoms started about 3 months ago - unknown etiology; has difficulty walking outside in open spaces and also difficulty walking through double doors at [facility] - thinks anxiety may be part of the cause  Non-Vestibular symptoms:  decreased hearing in Rt ear due to schwannoma 13 yrs ago  Type of dizziness: Imbalance (Disequilibrium), Unsteady with head/body  turns, Lightheadedness/Faint, and "Funny feeling in the head"  Frequency: varies - not every single day  Duration: varies from seconds to 5" max  Aggravating factors: Spontaneous, Induced by motion: occur when walking, and difficulty walking through double doors at cancer center  Relieving factors: rest and sitting down  Progression of symptoms: worse  OCULOMOTOR EXAM:  Ocular Alignment: normal  Ocular ROM: No Limitations  Spontaneous Nystagmus: absent  Gaze-Induced Nystagmus: absent  Smooth Pursuits: intact  Saccades: intact   VESTIBULAR - OCULAR REFLEX:   Head-Impulse Test: HIT Right: positive   MOTION SENSITIVITY:  Motion Sensitivity Quotient Intensity: 0 = none, 1 = Lightheaded, 2 = Mild, 3 = Moderate, 4 = Severe, 5 = Vomiting  Intensity  1. Sitting to supine   2. Supine to L side   3. Supine to R side   4. Supine to sitting   5. L Hallpike-Dix   6. Up from L    7. R Hallpike-Dix   8. Up from R    9. Sitting, head tipped to L  knee   10. Head up from L knee   11. Sitting, head tipped to R knee   12. Head up from R knee   13. Sitting head turns x5 0  14.Sitting head nods x5 0  15. In stance, 180 turn to L    16. In stance, 180 turn to R     mCTSIB;  Condition 1 - 30 secs:  Condition 2 - 30 secs: Condition 3 - 30 secs:  Condition 4 - 3.25 secs  VESTIBULAR TREATMENT:                                                                                                   DATE: 08-29-22  Other: Balance - Medbridge  Access Code: [email or web address] URL: [email or web address] Date: 08/30/2022 Prepared by: Maebelle Munroe  Exercises - Standing on Foam Pad  - 1 x daily - 7 x weekly - 1 sets - 1-2 reps - 30 sec hold - Standing Balance with Eyes Closed on Foam  - 1 x daily - 7 x weekly - 1 sets - 1-2 reps - 30 sec hold  PATIENT EDUCATION: Education details: eval results - 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 Yes  SHORT TERM GOALS: same as LTG's as ELOS = 4 weeks (start week of 09-09-22 due to schedule availability with primary PT)     LONG TERM GOALS: Target date: 10-04-22  Improve FOTO score by at least 10 points to demo improvement in dizziness. Baseline: To be completed at next session - not captured by front office at initial eval Goal status: INITIAL  2.  Pt will verbalize 3 compensatory techniques to use for reduction in intensity of episodic dizziness. Baseline:  Goal status: INITIAL  3.  Pt will demonstrate increased vestibular input in maintaining balance by standing on foam for at least 15 secs with EC.  Baseline: 3.25 secs with EC on foam Goal status: INITIAL  4.  Independent in HEP for vestibular/balance exercises. Baseline:   Goal status: INITIAL  5.  Complete DVA and establish goal if needed.  Baseline:  Goal status: INITIAL  ASSESSMENT:  CLINICAL IMPRESSION: Patient is a 62 y.o. lady who was seen today for physical therapy evaluation and treatment for episodic dizziness of unknown etiology.  Pt reports dizziness occurs spontaneously and only when walking.  Pt demonstrates decreased vestibular input in maintaining balance as pt able to stand on foam with EC for only 3.25 secs (feet shoulder width apart) - indicative of vestibular hypofunction. 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  Pt will benefit from PT to address dizziness and vestibular deficits and to teach compensatory techniques for managing episodic dizziness.   OBJECTIVE IMPAIRMENTS: decreased balance, difficulty walking, and dizziness.   ACTIVITY LIMITATIONS: bending and locomotion level  PARTICIPATION LIMITATIONS: driving, shopping, and community activity  PERSONAL FACTORS: Fitness, Past/current experiences, and 1-2 comorbidities: lung cancer metastatic to brain, h/o SAH and h/o seizure  are also affecting patient's functional outcome.   REHAB POTENTIAL: Good  CLINICAL DECISION MAKING: Evolving/moderate complexity  EVALUATION COMPLEXITY: Moderate   PLAN:  PT FREQUENCY: 1x/week  PT DURATION: 4 weeks  PLANNED INTERVENTIONS: Therapeutic exercises, Therapeutic activity, Neuromuscular re-education, Balance training, Gait training, Patient/Family education, Self Care, and Vestibular training  PLAN FOR NEXT SESSION: do FOTO; check HEP, teach compensatory techniques   Liberty Stead, Donavan Burnet, PT 08/30/2022, 4:48 PM

## 2022-08-29 NOTE — Telephone Encounter (Signed)
This nurse received a call from this patient stating that her primary care physician told her that she will not refill her Diflucan and that she needs to reach out to her oncologists to have it refilled.  Patient states that her symptoms are getting worse.  This nurse advised patient that the request has been forwarded to the provider. Patient also wants to know who will manage the side effects of chemotherapy because her primary care physician does not want to do that.    No further questions or concerns noted.

## 2022-08-30 ENCOUNTER — Other Ambulatory Visit: Payer: Self-pay

## 2022-08-30 ENCOUNTER — Other Ambulatory Visit (HOSPITAL_COMMUNITY): Payer: Self-pay

## 2022-08-30 ENCOUNTER — Encounter: Payer: Self-pay | Admitting: Internal Medicine

## 2022-08-30 ENCOUNTER — Telehealth: Payer: Self-pay | Admitting: Family

## 2022-08-30 DIAGNOSIS — F411 Generalized anxiety disorder: Secondary | ICD-10-CM

## 2022-08-30 DIAGNOSIS — Z79899 Other long term (current) drug therapy: Secondary | ICD-10-CM

## 2022-08-30 DIAGNOSIS — F1320 Sedative, hypnotic or anxiolytic dependence, uncomplicated: Secondary | ICD-10-CM

## 2022-08-30 MED ORDER — CLONAZEPAM 0.5 MG PO TABS
0.5000 mg | ORAL_TABLET | Freq: Two times a day (BID) | ORAL | 2 refills | Status: DC
Start: 2022-08-30 — End: 2022-11-04
  Filled 2022-08-30 (×2): qty 60, 30d supply, fill #0

## 2022-08-30 NOTE — Telephone Encounter (Signed)
Pt called to let [name] know that she is having issues getting her Clonazepam refilled because when she was in the hospital, the doctor there changed the directions and dosage of her clonazepam so now she has run out before she was supposed to. Needs [name] to fix her Clonazepam Rx and send to [facility].

## 2022-08-30 NOTE — Telephone Encounter (Signed)
Prescription sent to pharmacy.

## 2022-08-30 NOTE — Telephone Encounter (Signed)
Patient aware and verbalized understanding. °

## 2022-09-02 NOTE — Progress Notes (Unsigned)
[facility] OFFICE PROGRESS NOTE  Carolyn Spencer, FNP [address] [place] [address]  DIAGNOSIS: Stage IV (T3, N0, M1b) non-small cell lung cancer, adenosquamous carcinoma presented with large medial left lower lobe lung mass with bilateral pulmonary nodules and metastatic disease to the brain diagnosed in November 2023    Detected Alteration(s) / Biomarker(s) Associated FDA-approved therapies  Clinical Trial Availability      % cfDNA or Amplification   ATM [id number] approved in other indication Olaparib, Talazoparib Yes         0.5%   ATM R337H ND None None   . PD-L1 expression is negative  PRIOR THERAPY: 1) Status post SRS to brain metastasis completed March 26, 2022   CURRENT THERAPY: Systemic chemotherapy with carboplatin for AUC of 5, Alimta 500 Mg/M2 and Keytruda 200 Mg IV every 3 weeks status post 8 cycles. First dose was given on March 06, 2022.  Starting from cycle #5, the patient started maintenance Alimta and Keytruda IV every 3 weeks.   INTERVAL HISTORY: Carolyn Hooper 62 y.o. female returns to the clinic today for a follow-up visit. The patient was last seen by Dr. Arbutus Ped a little over 3 weeks ago. In the interval since last being seen, she was seen in the ER for hyperglycemia. She also had been struggling with yeast for which she was given diflucan. She also has called endorsing heartburn for which she is taking Protonix. She also mentions she had two episodes of sharp central suprapubic abdominal pain.  Her pain dissipated without any intervention after a few seconds/minutes.  She tried taking Pepcid and Mylanta without any improvement.  The only thing new recently is that she started taking probiotics and Activa 2-4 times per day.  She feels "98%" certain that this is related to gas.  As she was having the sensation of needing to have a bowel movement without any significant bowel movement but a lot of air.  She denies any constipation per se.  She  also denies any other associated symptoms such as fevers, dysuria, cloudy urine, or malodorous urine.  She had a UTI last month so she knows what symptoms to monitor for. She also had cholecystitis about 1 month ago. She was not a surgical candidate. She was treated with antibiotics. She was supposed to follow up with general surgery in one month. However, her recent episodes of pain were located in the suprapubic area as opposed to her RUQ.  She does presently have a vaginal yeast infection for which she is taking Diflucan.  She has never had issues with heartburn or recurrent episodes of vaginal yeast infection until recently which is frustrating for her.  She is currently on a steroid taper.  She is also undergoing vestibular rehab for her dizziness.  She describes unusual symptoms where she feels dizzy upon walking into the front doors of the cancer center regularly but her dizziness improves upon during the building.  She has tried meclizine in the past without any improvement.  She follows closely with neuro-oncology Dr. Barbaraann Cao.  Today she denies any fevers, chills, or night sweats unless it is a warm sleeping environment.  She reports overall her breathing is fair.  At times she has shortness of breath due to her nose getting "stopped up".  She did have a self-limiting nosebleed the other day without any provoking factors.  She intermittently may have a cough but nothing consistent.  She did not have any cough during her  encounter today. She denies any chest pain or hemoptysis.  Denies any nausea or vomiting.  Denies any diarrhea or constipation.  No rash on her extremities and dry skin after her last infusion but she was given triamcinolone cream with improvement in her rash.  She continues to have slow speech due to her metastatic disease to the brain.  She is here for evaluation and repeat blood work before starting cycle #9.     MEDICAL HISTORY: Past Medical History:  Diagnosis Date   Abscess     hydroadenitis superativa   Anxiety    DM (diabetes mellitus) (HCC)    Hyperinsulinemia    Hypertension 1983   gestational - pre-eclampsia   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[facility])    Neuroma    audiotry neuroma   Seizure (HCC)    Vertigo     ALLERGIES:  is allergic to adhesive [tape] and statins.  MEDICATIONS:  Current Outpatient Medications  Medication Sig Dispense Refill   albuterol (VENTOLIN HFA) 108 (90 Base) MCG/ACT inhaler Inhale 2 puffs into the lungs every 6 (six) hours as needed. 18 g 2   clonazePAM (KLONOPIN) 0.5 MG tablet Take 1 tablet (0.5 mg total) by mouth 2 (two) times daily. 60 tablet 2   dexamethasone (DECADRON) 1 MG tablet Take 3 tablets by mouth daily with breakfast for 7 days, THEN take 2 tablets daily for 7 days, THEN 1 tablet daily for 7 days. 42 tablet 0   famotidine (PEPCID) 20 MG tablet Take 1 tablet (20 mg total) by mouth 2 (two) times daily. 30 tablet 0   fluconazole (DIFLUCAN) 150 MG tablet Take 1 tablet by mouth every 3 days as needed (yeast infection). 3 tablet 0   folic acid (FOLVITE) 1 MG tablet Take 1 tablet (1 mg total) by mouth daily. 30 tablet 1   HYDROcodone-acetaminophen (NORCO) 5-325 MG tablet Take 1 - 2 tablets by mouth every 6 (six) hours as needed for moderate pain. 30 tablet 0   insulin glargine (LANTUS SOLOSTAR) 100 UNIT/ML Solostar Pen Inject 12 Units into the skin daily. 15 mL 2   Insulin Pen Needle (PEN NEEDLES) 32G X 4 MM MISC Use to inject insulin daily as directed. 100 each 11   levETIRAcetam (KEPPRA) 1000 MG tablet Take 1 tablet (1,000 mg total) by mouth 2 (two) times daily. 60 tablet 2   lidocaine (XYLOCAINE) 5 % ointment Apply topically as needed. 60 g 1   lidocaine-prilocaine (EMLA) cream Apply to port prior to access 30 g 0   metFORMIN (GLUCOPHAGE) 1000 MG tablet Take 1 tablet (1,000 mg total) by mouth 2 (two) times daily. 180 tablet 1   ondansetron (ZOFRAN) 4 MG tablet Take 1 tablet (4 mg total) by mouth every 6 (six) hours as  needed for nausea. (Patient taking differently: Take 4-8 mg by mouth every 6 (six) hours as needed for nausea.) 30 tablet 2   prochlorperazine (COMPAZINE) 10 MG tablet Take 1 tablet (10 mg total) by mouth every 6 (six) hours as needed. 30 tablet 2   triamcinolone ointment (KENALOG) 0.5 % Apply topically to affected area twice daily as directed, 30 g 0   No current facility-administered medications for this visit.    SURGICAL HISTORY:  Past Surgical History:  Procedure Laterality Date   ABDOMINAL HYSTERECTOMY  2006   remaining left ovary   BRONCHIAL BIOPSY  02/19/2022   Procedure: BRONCHIAL BIOPSIES;  Surgeon: Josephine Igo, DO;  Location: [facility];  Service: Pulmonary;;  BRONCHIAL BRUSHINGS  02/19/2022   Procedure: BRONCHIAL BRUSHINGS;  Surgeon: Josephine Igo, DO;  Location: [facility];  Service: Pulmonary;;   BRONCHIAL NEEDLE ASPIRATION BIOPSY  02/19/2022   Procedure: BRONCHIAL NEEDLE ASPIRATION BIOPSIES;  Surgeon: Josephine Igo, DO;  Location: [facility];  Service: Pulmonary;;   IR IMAGING GUIDED PORT INSERTION  03/29/2022   SHOULDER SURGERY Right 1960s    REVIEW OF SYSTEMS:   Review of Systems  Constitutional: Positive for intermittent fatigue.  Her appetite "comes and goes".  Negative for chills, fever and unexpected weight change.  HENT: Negative for mouth sores, nosebleeds, sore throat and trouble swallowing.   Eyes: Negative for eye problems and icterus.  Respiratory: Positive for occasional dyspnea and occasional inconsistent cough.  Negative for hemoptysis and wheezing.   Cardiovascular: Negative for chest pain and leg swelling.  Gastrointestinal: Positive for 2 episodes of suprapubic abdominal pain that resolved spontaneously. Negative for abdominal pain, constipation, diarrhea, nausea and vomiting.  Genitourinary: Positive for vaginal yeast infection.  Negative for bladder incontinence, difficulty urinating, dysuria, frequency and hematuria.   Musculoskeletal:  Negative for back pain, gait problem, neck pain and neck stiffness.  Skin: Negative for itching and rash.  Neurological: Today for episodes of dizziness and balance change.  Negative for  extremity weakness, gait problem, headaches, light-headedness and seizures.  Hematological: Negative for adenopathy.  Today for large bruise on the left breast from fall 5 weeks ago. Psychiatric/Behavioral: Negative for confusion, depression and sleep disturbance. The patient is not nervous/anxious.     PHYSICAL EXAMINATION:  Blood pressure 137/75, pulse 69, temperature (!) 97.4 F (36.3 C), temperature source Oral, resp. rate 13, weight 228 lb 1.6 oz (103.5 kg), SpO2 98 %.  ECOG PERFORMANCE STATUS: 1  Physical Exam  Constitutional: Oriented to person, place, and time and well-developed, well-nourished, and in no distress. HENT:  Head: Normocephalic and atraumatic.  Mouth/Throat: Oropharynx is clear and moist. No oropharyngeal exudate.  Eyes: Conjunctivae are normal. Right eye exhibits no discharge. Left eye exhibits no discharge. No scleral icterus.  Neck: Normal range of motion. Neck supple.  Cardiovascular: Normal rate, regular rhythm, normal heart sounds and intact distal pulses.   Pulmonary/Chest: Effort normal and breath sounds normal. No respiratory distress. No wheezes. No rales.  Abdominal: Soft. Bowel sounds are normal. Exhibits no distension and no mass. There is no tenderness.  Musculoskeletal: Normal range of motion. Exhibits no edema.  Lymphadenopathy:    No cervical adenopathy.  Neurological: Alert and oriented to person, place, and time. Exhibits muscle wasting.  Skin: Stated for some facial flushing from steroid use.  Positive for large bruise on the left breast from a fall 5 weeks ago.  Skin is warm and dry. No rash noted. Not diaphoretic. No erythema. No pallor.  Psychiatric: Mood, memory and judgment normal.  Vitals reviewed.  LABORATORY DATA: Lab Results  Component Value Date    WBC 8.6 09/03/2022   HGB 11.6 (L) 09/03/2022   HCT 36.0 09/03/2022   MCV 99.2 09/03/2022   PLT 191 09/03/2022      Chemistry      Component Value Date/Time   NA 137 09/03/2022 0725   NA 141 07/12/2022 1114   K 4.0 09/03/2022 0725   CL 100 09/03/2022 0725   CO2 30 09/03/2022 0725   BUN 19 09/03/2022 0725   BUN 11 07/12/2022 1114   CREATININE 0.61 09/03/2022 0725      Component Value Date/Time   CALCIUM 8.8 (L) 09/03/2022 0725  ALKPHOS 61 09/03/2022 0725   AST 19 09/03/2022 0725   ALT 58 (H) 09/03/2022 0725   BILITOT 0.6 09/03/2022 0725       RADIOGRAPHIC STUDIES:  CT Head Wo Contrast  Result Date: 08/13/2022 CLINICAL DATA:  Facial trauma EXAM: CT HEAD WITHOUT CONTRAST CT MAXILLOFACIAL WITHOUT CONTRAST TECHNIQUE: Multidetector CT imaging of the head and maxillofacial structures were performed using the standard protocol without intravenous contrast. Multiplanar CT image reconstructions of the maxillofacial structures were also generated. RADIATION DOSE REDUCTION: This exam was performed according to the departmental dose-optimization program which includes automated exposure control, adjustment of the mA and/or kV according to patient size and/or use of iterative reconstruction technique. COMPARISON:  Brain MRI and head CT 08/07/2022 FINDINGS: CT HEAD FINDINGS Brain: There is no acute intracranial hemorrhage, extra-axial fluid collection, or acute infarct. Background parenchymal volume is normal. The ventricles are normal in size. There is edema in the left precentral gyrus related to the known metastatic lesion in this location common better evaluated on the recent brain MRI from 08/07/2022. The extent of edema is not significantly changed. There is no significant mass effect or midline shift. There is no other parenchymal edema. The punctate lesion in the right frontal lobe and right vestibular schwannoma seen on the prior MRI are not well seen on the current study. Gray-white  differentiation is preserved. The pituitary and suprasellar region are normal. Vascular: No hyperdense vessel or unexpected calcification. Skull: Normal. Negative for fracture or focal lesion. Other: None. CT MAXILLOFACIAL FINDINGS Osseous: There is no acute facial bone fracture. There is no evidence of mandibular dislocation. There is no suspicious osseous lesion. Orbits: The globes and orbits are unremarkable. There is no retrobulbar hematoma or proptosis. Sinuses: There is mild mucosal thickening in the paranasal sinuses. Soft tissues: Unremarkable. IMPRESSION: 1. No acute intracranial pathology. 2. Stable edema in the left precentral gyrus related to the known metastatic lesion. No significant mass effect or midline shift. 3. No acute facial bone fracture. Electronically Signed   By: Lesia Hausen M.D.   On: 08/13/2022 13:13   CT Maxillofacial Wo Contrast  Result Date: 08/13/2022 CLINICAL DATA:  Facial trauma EXAM: CT HEAD WITHOUT CONTRAST CT MAXILLOFACIAL WITHOUT CONTRAST TECHNIQUE: Multidetector CT imaging of the head and maxillofacial structures were performed using the standard protocol without intravenous contrast. Multiplanar CT image reconstructions of the maxillofacial structures were also generated. RADIATION DOSE REDUCTION: This exam was performed according to the departmental dose-optimization program which includes automated exposure control, adjustment of the mA and/or kV according to patient size and/or use of iterative reconstruction technique. COMPARISON:  Brain MRI and head CT 08/07/2022 FINDINGS: CT HEAD FINDINGS Brain: There is no acute intracranial hemorrhage, extra-axial fluid collection, or acute infarct. Background parenchymal volume is normal. The ventricles are normal in size. There is edema in the left precentral gyrus related to the known metastatic lesion in this location common better evaluated on the recent brain MRI from 08/07/2022. The extent of edema is not significantly  changed. There is no significant mass effect or midline shift. There is no other parenchymal edema. The punctate lesion in the right frontal lobe and right vestibular schwannoma seen on the prior MRI are not well seen on the current study. Gray-white differentiation is preserved. The pituitary and suprasellar region are normal. Vascular: No hyperdense vessel or unexpected calcification. Skull: Normal. Negative for fracture or focal lesion. Other: None. CT MAXILLOFACIAL FINDINGS Osseous: There is no acute facial bone fracture. There is no evidence of  mandibular dislocation. There is no suspicious osseous lesion. Orbits: The globes and orbits are unremarkable. There is no retrobulbar hematoma or proptosis. Sinuses: There is mild mucosal thickening in the paranasal sinuses. Soft tissues: Unremarkable. IMPRESSION: 1. No acute intracranial pathology. 2. Stable edema in the left precentral gyrus related to the known metastatic lesion. No significant mass effect or midline shift. 3. No acute facial bone fracture. Electronically Signed   By: Lesia Hausen M.D.   On: 08/13/2022 13:13   DG Elbow Complete Left  Result Date: 08/13/2022 CLINICAL DATA:  Pain after fall EXAM: LEFT ELBOW - COMPLETE 4 VIEW COMPARISON:  None Available. FINDINGS: Hyperostosis along the distal humerus. No acute fracture or dislocation. Preserved joint spaces and bone mineralization. No joint effusion on lateral view. Mild dorsal soft tissue swelling about the olecranon. IMPRESSION: Mild dorsal soft tissue swelling about the olecranon. Please correlate for soft tissue thickening or abnormal bursa. Electronically Signed   By: Karen Kays M.D.   On: 08/13/2022 12:44   DG Hand Complete Left  Result Date: 08/13/2022 CLINICAL DATA:  Pain after fall EXAM: LEFT HAND - COMPLETE 3 VIEW COMPARISON:  None Available. FINDINGS: No fracture or dislocation. Preserved joint spaces and bone mineralization. IMPRESSION: No acute osseous abnormality. Electronically  Signed   By: Karen Kays M.D.   On: 08/13/2022 12:43   DG Chest 2 View  Result Date: 08/13/2022 CLINICAL DATA:  Cough. Fall 2 days ago. History of brain neoplasm and lung cancer EXAM: CHEST - 2 VIEW COMPARISON:  Chest x-ray one view 07/26/2018 FINDINGS: Stable right IJ chest port with the tip near the SVC right atrial junction. Mass lesion again seen in the medial left lung base. Please correlate with prior CT scan. Small right-sided midlung 11 mm nodule as well. No pneumothorax, effusion or edema. Normal cardiopericardial silhouette. Degenerative changes of the spine. IMPRESSION: Stable mass lesions at the left lung base and right midlung. Chest port. No pneumothorax or edema. Electronically Signed   By: Karen Kays M.D.   On: 08/13/2022 12:42   MR Brain W and Wo Contrast  Result Date: 08/07/2022 CLINICAL DATA:  Brain metastases.  Assess post treatment response. EXAM: MRI HEAD WITHOUT AND WITH CONTRAST TECHNIQUE: Multiplanar, multiecho pulse sequences of the brain and surrounding structures were obtained without and with intravenous contrast. CONTRAST:  10mL GADAVIST GADOBUTROL 1 MMOL/ML IV SOLN COMPARISON:  06/06/2022 FINDINGS: Brain: No acute infarct, mass effect or extra-axial collection. No acute or chronic hemorrhage. Left precentral gyrus mass measures 1.6 x 1.4 x 1.4 cm, unchanged allowing for subtle differences in slice selection. The degree of surrounding edema has worsened. The previously seen enhancing nodule of the paramedian right frontal lobe is decreased in size and is now punctate (series 16, image 13). The midline structures are normal. Unchanged 4 mm right vestibular schwannoma. Vascular: Major flow voids are preserved. Skull and upper cervical spine: Normal calvarium and skull base. Visualized upper cervical spine and soft tissues are normal. Sinuses/Orbits:No paranasal sinus fluid levels or advanced mucosal thickening. No mastoid or middle ear effusion. Normal orbits. IMPRESSION: 1.  Unchanged size of left precentral gyrus mass with worsened surrounding edema. 2. Decreased size of previously seen enhancing nodule of the paramedian right frontal lobe, now punctate. 3. No new lesions. 4. Unchanged 4 mm right vestibular schwannoma. Electronically Signed   By: Deatra Robinson M.D.   On: 08/07/2022 19:43   CT Head Wo Contrast  Result Date: 08/07/2022 CLINICAL DATA:  Difficulty speaking history of lung  cancer EXAM: CT HEAD WITHOUT CONTRAST TECHNIQUE: Contiguous axial images were obtained from the base of the skull through the vertex without intravenous contrast. RADIATION DOSE REDUCTION: This exam was performed according to the departmental dose-optimization program which includes automated exposure control, adjustment of the mA and/or kV according to patient size and/or use of iterative reconstruction technique. COMPARISON:  Brain MRI 06/06/2022, head CT 07/16/2022 FINDINGS: Brain: No hemorrhage is visualized. Slight increased edema within the left frontal white matter corresponding to history of known metastatic lesion. No significant mass effect or midline shift. Smaller MRI demonstrated lesion near the right vertex is not well seen on CT. Stable ventricle size Vascular: No hyperdense vessels.  Carotid vascular calcification Skull: Normal. Negative for fracture or focal lesion. Sinuses/Orbits: No acute finding.  Patchy mucosal thickening Other: None IMPRESSION: 1. Negative for acute intracranial hemorrhage. 2. Slight increased edema/hypodensity within the left frontal white since head CT 07/16/2022 matter corresponding to history of known metastatic disease. No significant mass effect or midline shift. Electronically Signed   By: Jasmine Pang M.D.   On: 08/07/2022 16:59     ASSESSMENT/PLAN:  This is a very pleasant 62 year old Caucasian female diagnosed with stage IV (T3, N0, M1 B) non-small cell lung cancer, adenosquamous carcinoma.  She presented with a large medial left lower lobe lung  mass with bilateral pulmonary nodules metastatic disease to the brain.  She was diagnosed in November 2023. She does not have any actionable mutations.    She completed SRS to the metastatic brain lesions on 03/26/2022.   The patient had developed seizures and was seen in the emergency room.  Her dose of Keppra was increased and she was also put on higher dose of Decadron.  She is undergoing palliative systemic chemotherapy and immunotherapy with carboplatin for an AUC of 5, Alimta 500 mg/m, Keytruda 20 mg IV every 3 weeks.  She is status post 8 cycles. Starting from cycle #5, she started maintenance alimta and [place].   Labs were reviewed. Recommend she proceed with cycle #9 today scheduled.  The patient's last CT scan showed The scan showed some increased volume of the consolidation near the left lower lobe lung mass.  There are only mild changes in the stable to slightly increased pulmonary nodules and left paratracheal adenopathy and right hilar adenopathy. Dr. Arbutus Ped recommended close monitoring and repeat imaging studies after 2 cycles of treatment. I will arrange for a restaging CT scan of the CAP before her next visit.  Did warn the patient that the turnaround time is for the scan results is closer to 5 to 7 days at this point in time.  We will see her back for follow-up visit in 3 weeks for evaluation repeat blood work before undergoing cycle #10  Will continue her Decadron taper.  Did discuss that she has multiple reasons for immunosuppression which could contribute to her recurrent yeast infections.  Advised to call us if she needs refill of her Diflucan.  Also discussed that the Decadron can cause heartburn and elevated blood sugars.  She will continue taking Protonix at this time.  She did mention that when she has been tapering off her steroid, she has had improvement in her blood sugars.  Regarding her 2 episodes of abdominal pain, she is not having any pain at this time. These  episodes included sharp suprapubic pain that resolved spontaneously after a few minutes without any provoking factors such as movement, bowel movements, or eating. The patient attributes this to gas.  We discussed using Gas-X and ensuring regular bowel movements.  She is concerned if she takes a stool softener she may have diarrhea.  We discussed drinking plenty of fluids, eating fruits and vegetables, being as active as possible to reduce the risk of constipation.  Additionally, she has been taking a lot of probiotic recently.  Discussed she may want to cut back or discontinue this at this time to see if that helps with her episodes of gas pain as this started around the time she started taking 2-4 Activa's per day and a probiotic pill.  CT scan that is upcoming will include the abdomen and pelvis.  Of course if she has any new or worsening symptoms in the interval, she is advised to call us sooner for reevaluation and we can try to get her CT sooner.  She is not having any abdominal pain at this time.  She also declines any urinary symptoms of urinary tract infection does not feel that she needs to be evaluated for UTI. She had cholecystitis about one month ago. She localized her discomfort to the suprapubic area and not the RUQ. She is well appearing today without any abdominal pain. Will monitor and she will call back if she develops recurrent symptoms.  She will continue with vestibular rehab.  Patient mention she had an episode of a self-limiting nosebleed.  She was advised to use saline spray if needed.  If the patient has recurrent rash, she is advised to send [place] a picture on MyChart so we may determine if this is related to her immunotherapy.  She was advised to use lotion.  Should she develop any rash, she has her prescription of triamcinolone cream.  The patient was advised to call immediately if she has any concerning symptoms in the interval. The patient voices understanding of current disease  status and treatment options and is in agreement with the current care plan. All questions were answered. The patient knows to call the clinic with any problems, questions or concerns. We can certainly see the patient much sooner if necessary             Orders Placed This Encounter  Procedures   CT Chest W Contrast    Standing Status:   Future    Standing Expiration Date:   09/03/2023    Order Specific Question:   If indicated for the ordered procedure, I authorize the administration of contrast media per Radiology protocol    Answer:   Yes    Order Specific Question:   Does the patient have a contrast media/X-ray dye allergy?    Answer:   No    Order Specific Question:   Preferred imaging location?    Answer:   [facility]   CT Abdomen Pelvis W Contrast    Standing Status:   Future    Standing Expiration Date:   09/03/2023    Order Specific Question:   If indicated for the ordered procedure, I authorize the administration of contrast media per Radiology protocol    Answer:   Yes    Order Specific Question:   Does the patient have a contrast media/X-ray dye allergy?    Answer:   No    Order Specific Question:   Preferred imaging location?    Answer:   [facility]    Order Specific Question:   If indicated for the ordered procedure, I authorize the administration of oral contrast media per Radiology protocol    Answer:  Yes     The total time spent in the appointment was 30-39 minutes  Fionn Stracke L Sherilee Smotherman, PA-C 09/03/22

## 2022-09-03 ENCOUNTER — Inpatient Hospital Stay (HOSPITAL_BASED_OUTPATIENT_CLINIC_OR_DEPARTMENT_OTHER): Payer: BC Managed Care – PPO | Admitting: Physician Assistant

## 2022-09-03 ENCOUNTER — Inpatient Hospital Stay: Payer: BC Managed Care – PPO

## 2022-09-03 ENCOUNTER — Other Ambulatory Visit: Payer: Self-pay

## 2022-09-03 VITALS — BP 137/75 | HR 69 | Temp 97.4°F | Resp 13 | Wt 228.1 lb

## 2022-09-03 VITALS — BP 125/59 | HR 71 | Resp 16

## 2022-09-03 DIAGNOSIS — Z5112 Encounter for antineoplastic immunotherapy: Secondary | ICD-10-CM

## 2022-09-03 DIAGNOSIS — C786 Secondary malignant neoplasm of retroperitoneum and peritoneum: Secondary | ICD-10-CM | POA: Diagnosis not present

## 2022-09-03 DIAGNOSIS — C3492 Malignant neoplasm of unspecified part of left bronchus or lung: Secondary | ICD-10-CM

## 2022-09-03 DIAGNOSIS — Z5111 Encounter for antineoplastic chemotherapy: Secondary | ICD-10-CM

## 2022-09-03 DIAGNOSIS — K862 Cyst of pancreas: Secondary | ICD-10-CM | POA: Diagnosis not present

## 2022-09-03 DIAGNOSIS — R8281 Pyuria: Secondary | ICD-10-CM | POA: Diagnosis not present

## 2022-09-03 DIAGNOSIS — Z7984 Long term (current) use of oral hypoglycemic drugs: Secondary | ICD-10-CM | POA: Diagnosis not present

## 2022-09-03 DIAGNOSIS — K760 Fatty (change of) liver, not elsewhere classified: Secondary | ICD-10-CM | POA: Diagnosis not present

## 2022-09-03 DIAGNOSIS — R748 Abnormal levels of other serum enzymes: Secondary | ICD-10-CM | POA: Diagnosis not present

## 2022-09-03 DIAGNOSIS — I10 Essential (primary) hypertension: Secondary | ICD-10-CM | POA: Diagnosis not present

## 2022-09-03 DIAGNOSIS — Z833 Family history of diabetes mellitus: Secondary | ICD-10-CM | POA: Diagnosis not present

## 2022-09-03 DIAGNOSIS — Z87891 Personal history of nicotine dependence: Secondary | ICD-10-CM | POA: Diagnosis not present

## 2022-09-03 DIAGNOSIS — R7401 Elevation of levels of liver transaminase levels: Secondary | ICD-10-CM | POA: Diagnosis not present

## 2022-09-03 DIAGNOSIS — Z841 Family history of disorders of kidney and ureter: Secondary | ICD-10-CM | POA: Diagnosis not present

## 2022-09-03 DIAGNOSIS — N390 Urinary tract infection, site not specified: Secondary | ICD-10-CM | POA: Diagnosis not present

## 2022-09-03 DIAGNOSIS — Z95828 Presence of other vascular implants and grafts: Secondary | ICD-10-CM

## 2022-09-03 DIAGNOSIS — C7931 Secondary malignant neoplasm of brain: Secondary | ICD-10-CM | POA: Diagnosis not present

## 2022-09-03 DIAGNOSIS — K8020 Calculus of gallbladder without cholecystitis without obstruction: Secondary | ICD-10-CM | POA: Diagnosis not present

## 2022-09-03 DIAGNOSIS — G40909 Epilepsy, unspecified, not intractable, without status epilepticus: Secondary | ICD-10-CM | POA: Diagnosis not present

## 2022-09-03 DIAGNOSIS — R932 Abnormal findings on diagnostic imaging of liver and biliary tract: Secondary | ICD-10-CM | POA: Diagnosis not present

## 2022-09-03 DIAGNOSIS — Z825 Family history of asthma and other chronic lower respiratory diseases: Secondary | ICD-10-CM | POA: Diagnosis not present

## 2022-09-03 DIAGNOSIS — K8531 Drug induced acute pancreatitis with uninfected necrosis: Secondary | ICD-10-CM | POA: Diagnosis not present

## 2022-09-03 DIAGNOSIS — Z794 Long term (current) use of insulin: Secondary | ICD-10-CM | POA: Diagnosis not present

## 2022-09-03 DIAGNOSIS — D61811 Other drug-induced pancytopenia: Secondary | ICD-10-CM | POA: Diagnosis not present

## 2022-09-03 DIAGNOSIS — K8591 Acute pancreatitis with uninfected necrosis, unspecified: Secondary | ICD-10-CM | POA: Diagnosis not present

## 2022-09-03 DIAGNOSIS — D61818 Other pancytopenia: Secondary | ICD-10-CM | POA: Diagnosis not present

## 2022-09-03 DIAGNOSIS — Z90710 Acquired absence of both cervix and uterus: Secondary | ICD-10-CM | POA: Diagnosis not present

## 2022-09-03 DIAGNOSIS — C3432 Malignant neoplasm of lower lobe, left bronchus or lung: Secondary | ICD-10-CM | POA: Diagnosis not present

## 2022-09-03 DIAGNOSIS — R079 Chest pain, unspecified: Secondary | ICD-10-CM | POA: Diagnosis not present

## 2022-09-03 DIAGNOSIS — K863 Pseudocyst of pancreas: Secondary | ICD-10-CM | POA: Diagnosis not present

## 2022-09-03 DIAGNOSIS — Y929 Unspecified place or not applicable: Secondary | ICD-10-CM | POA: Diagnosis not present

## 2022-09-03 DIAGNOSIS — Z8261 Family history of arthritis: Secondary | ICD-10-CM | POA: Diagnosis not present

## 2022-09-03 DIAGNOSIS — K859 Acute pancreatitis without necrosis or infection, unspecified: Secondary | ICD-10-CM | POA: Diagnosis not present

## 2022-09-03 DIAGNOSIS — K8511 Biliary acute pancreatitis with uninfected necrosis: Secondary | ICD-10-CM | POA: Diagnosis not present

## 2022-09-03 DIAGNOSIS — T451X5A Adverse effect of antineoplastic and immunosuppressive drugs, initial encounter: Secondary | ICD-10-CM | POA: Diagnosis not present

## 2022-09-03 DIAGNOSIS — C3490 Malignant neoplasm of unspecified part of unspecified bronchus or lung: Secondary | ICD-10-CM | POA: Diagnosis not present

## 2022-09-03 DIAGNOSIS — Z6834 Body mass index (BMI) 34.0-34.9, adult: Secondary | ICD-10-CM | POA: Diagnosis not present

## 2022-09-03 DIAGNOSIS — R935 Abnormal findings on diagnostic imaging of other abdominal regions, including retroperitoneum: Secondary | ICD-10-CM | POA: Diagnosis not present

## 2022-09-03 DIAGNOSIS — R188 Other ascites: Secondary | ICD-10-CM | POA: Diagnosis not present

## 2022-09-03 DIAGNOSIS — Z8249 Family history of ischemic heart disease and other diseases of the circulatory system: Secondary | ICD-10-CM | POA: Diagnosis not present

## 2022-09-03 DIAGNOSIS — Z888 Allergy status to other drugs, medicaments and biological substances status: Secondary | ICD-10-CM | POA: Diagnosis not present

## 2022-09-03 DIAGNOSIS — E669 Obesity, unspecified: Secondary | ICD-10-CM | POA: Diagnosis not present

## 2022-09-03 DIAGNOSIS — E1165 Type 2 diabetes mellitus with hyperglycemia: Secondary | ICD-10-CM | POA: Diagnosis not present

## 2022-09-03 LAB — CMP (CANCER CENTER ONLY)
ALT: 58 U/L — ABNORMAL HIGH (ref 0–44)
AST: 19 U/L (ref 15–41)
Albumin: 3.1 g/dL — ABNORMAL LOW (ref 3.5–5.0)
Alkaline Phosphatase: 61 U/L (ref 38–126)
Anion gap: 7 (ref 5–15)
BUN: 19 mg/dL (ref 8–23)
CO2: 30 mmol/L (ref 22–32)
Calcium: 8.8 mg/dL — ABNORMAL LOW (ref 8.9–10.3)
Chloride: 100 mmol/L (ref 98–111)
Creatinine: 0.61 mg/dL (ref 0.44–1.00)
GFR, Estimated: >60 mL/min (ref >60)
Glucose, Bld: 249 mg/dL — ABNORMAL HIGH (ref 70–99)
Potassium: 4 mmol/L (ref 3.5–5.1)
Sodium: 137 mmol/L (ref 135–145)
Total Bilirubin: 0.6 mg/dL (ref 0.3–1.2)
Total Protein: 5.5 g/dL — ABNORMAL LOW (ref 6.5–8.1)

## 2022-09-03 LAB — CBC WITH DIFFERENTIAL (CANCER CENTER ONLY)
Abs Immature Granulocytes: 0.08 10*3/uL — ABNORMAL HIGH (ref 0.00–0.07)
Basophils Absolute: 0 10*3/uL (ref 0.0–0.1)
Basophils Relative: 0 %
Eosinophils Absolute: 0.2 10*3/uL (ref 0.0–0.5)
Eosinophils Relative: 2 %
HCT: 36 % (ref 36.0–46.0)
Hemoglobin: 11.6 g/dL — ABNORMAL LOW (ref 12.0–15.0)
Immature Granulocytes: 1 %
Lymphocytes Relative: 20 %
Lymphs Abs: 1.7 10*3/uL (ref 0.7–4.0)
MCH: 32 pg (ref 26.0–34.0)
MCHC: 32.2 g/dL (ref 30.0–36.0)
MCV: 99.2 fL (ref 80.0–100.0)
Monocytes Absolute: 0.7 10*3/uL (ref 0.1–1.0)
Monocytes Relative: 8 %
Neutro Abs: 6 10*3/uL (ref 1.7–7.7)
Neutrophils Relative %: 69 %
Platelet Count: 191 10*3/uL (ref 150–400)
RBC: 3.63 MIL/uL — ABNORMAL LOW (ref 3.87–5.11)
RDW: 17 % — ABNORMAL HIGH (ref 11.5–15.5)
WBC Count: 8.6 10*3/uL (ref 4.0–10.5)
nRBC: 0 % (ref 0.0–0.2)

## 2022-09-03 LAB — TSH: TSH: 4.759 u[IU]/mL — ABNORMAL HIGH (ref 0.350–4.500)

## 2022-09-03 MED ORDER — HEPARIN SOD (PORK) LOCK FLUSH 100 UNIT/ML IV SOLN
500.0000 [IU] | Freq: Once | INTRAVENOUS | Status: AC | PRN
  Administered 2022-09-03: 500 [IU]

## 2022-09-03 MED ORDER — PROCHLORPERAZINE MALEATE 10 MG PO TABS
10.0000 mg | ORAL_TABLET | Freq: Once | ORAL | Status: AC
  Administered 2022-09-03: 10 mg via ORAL
  Filled 2022-09-03: qty 1

## 2022-09-03 MED ORDER — CYANOCOBALAMIN 1000 MCG/ML IJ SOLN
1000.0000 ug | Freq: Once | INTRAMUSCULAR | Status: AC
  Administered 2022-09-03: 1000 ug via INTRAMUSCULAR
  Filled 2022-09-03: qty 1

## 2022-09-03 MED ORDER — SODIUM CHLORIDE 0.9 % IV SOLN
500.0000 mg/m2 | Freq: Once | INTRAVENOUS | Status: AC
  Administered 2022-09-03: 1100 mg via INTRAVENOUS
  Filled 2022-09-03: qty 40

## 2022-09-03 MED ORDER — SODIUM CHLORIDE 0.9 % IV SOLN: Freq: Once | INTRAVENOUS | Status: AC

## 2022-09-03 MED ORDER — SODIUM CHLORIDE 0.9% FLUSH
10.0000 mL | INTRAVENOUS | Status: DC | PRN
  Administered 2022-09-03: 10 mL

## 2022-09-03 MED ORDER — SODIUM CHLORIDE 0.9 % IV SOLN
200.0000 mg | Freq: Once | INTRAVENOUS | Status: AC
  Administered 2022-09-03: 200 mg via INTRAVENOUS
  Filled 2022-09-03: qty 200

## 2022-09-03 MED ORDER — SODIUM CHLORIDE 0.9% FLUSH
10.0000 mL | Freq: Once | INTRAVENOUS | Status: AC
  Administered 2022-09-03: 10 mL

## 2022-09-03 NOTE — Patient Instructions (Signed)
[facility]  Discharge Instructions: Thank you for choosing [facility] to provide your oncology and hematology care.   If you have a lab appointment with the Cancer Center, please go directly to the Cancer Center and check in at the registration area.   Wear comfortable clothing and clothing appropriate for easy access to any Portacath or PICC line.   We strive to give you quality time with your provider. You may need to reschedule your appointment if you arrive late (15 or more minutes).  Arriving late affects you and other patients whose appointments are after yours.  Also, if you miss three or more appointments without notifying the office, you may be dismissed from the clinic at the provider's discretion.      For prescription refill requests, have your pharmacy contact our office and allow 72 hours for refills to be completed.    Today you received the following chemotherapy and/or immunotherapy agents: Keytruda, Alimta.       To help prevent nausea and vomiting after your treatment, we encourage you to take your nausea medication as directed.  BELOW ARE SYMPTOMS THAT SHOULD BE REPORTED IMMEDIATELY: *FEVER GREATER THAN 100.4 F (38 C) OR HIGHER *CHILLS OR SWEATING *NAUSEA AND VOMITING THAT IS NOT CONTROLLED WITH YOUR NAUSEA MEDICATION *UNUSUAL SHORTNESS OF BREATH *UNUSUAL BRUISING OR BLEEDING *URINARY PROBLEMS (pain or burning when urinating, or frequent urination) *BOWEL PROBLEMS (unusual diarrhea, constipation, pain near the anus) TENDERNESS IN MOUTH AND THROAT WITH OR WITHOUT PRESENCE OF ULCERS (sore throat, sores in mouth, or a toothache) UNUSUAL RASH, SWELLING OR PAIN  UNUSUAL VAGINAL DISCHARGE OR ITCHING   Items with * indicate a potential emergency and should be followed up as soon as possible or go to the Emergency Department if any problems should occur.  Please show the CHEMOTHERAPY ALERT CARD or IMMUNOTHERAPY ALERT CARD  at check-in to the Emergency Department and triage nurse.  Should you have questions after your visit or need to cancel or reschedule your appointment, please contact [facility]  Dept: [phone]  and follow the prompts.  Office hours are 8:00 a.m. to 4:30 p.m. Monday - Friday. Please note that voicemails left after 4:00 p.m. may not be returned until the following business day.  We are closed weekends and major holidays. You have access to a nurse at all times for urgent questions. Please call the main number to the clinic Dept: [phone] and follow the prompts.   For any non-urgent questions, you may also contact your provider using MyChart. We now offer e-Visits for anyone 18 and older to request care online for non-urgent symptoms. For details visit [email or web address].   Also download the MyChart app! Go to the app store, search "MyChart", open the app, select [facility], and log in with your MyChart username and password.   

## 2022-09-04 ENCOUNTER — Other Ambulatory Visit: Payer: Self-pay

## 2022-09-04 ENCOUNTER — Emergency Department (HOSPITAL_COMMUNITY): Payer: BC Managed Care – PPO

## 2022-09-04 ENCOUNTER — Encounter (HOSPITAL_COMMUNITY): Payer: Self-pay | Admitting: Emergency Medicine

## 2022-09-04 ENCOUNTER — Telehealth: Payer: Self-pay

## 2022-09-04 ENCOUNTER — Inpatient Hospital Stay (HOSPITAL_COMMUNITY)
Admission: EM | Admit: 2022-09-04 | Discharge: 2022-09-12 | DRG: 438 | Disposition: A | Discharge status: Home / Self Care | Payer: BC Managed Care – PPO | Attending: Internal Medicine | Admitting: Internal Medicine

## 2022-09-04 DIAGNOSIS — Z794 Long term (current) use of insulin: Secondary | ICD-10-CM

## 2022-09-04 DIAGNOSIS — R8281 Pyuria: Secondary | ICD-10-CM | POA: Diagnosis not present

## 2022-09-04 DIAGNOSIS — R7989 Other specified abnormal findings of blood chemistry: Secondary | ICD-10-CM | POA: Diagnosis present

## 2022-09-04 DIAGNOSIS — K859 Acute pancreatitis without necrosis or infection, unspecified: Secondary | ICD-10-CM | POA: Diagnosis present

## 2022-09-04 DIAGNOSIS — K8511 Biliary acute pancreatitis with uninfected necrosis: Secondary | ICD-10-CM | POA: Diagnosis present

## 2022-09-04 DIAGNOSIS — C7931 Secondary malignant neoplasm of brain: Secondary | ICD-10-CM | POA: Diagnosis present

## 2022-09-04 DIAGNOSIS — E1165 Type 2 diabetes mellitus with hyperglycemia: Secondary | ICD-10-CM | POA: Diagnosis present

## 2022-09-04 DIAGNOSIS — K8070 Calculus of gallbladder and bile duct without cholecystitis without obstruction: Secondary | ICD-10-CM | POA: Diagnosis present

## 2022-09-04 DIAGNOSIS — M549 Dorsalgia, unspecified: Secondary | ICD-10-CM

## 2022-09-04 DIAGNOSIS — D61818 Other pancytopenia: Secondary | ICD-10-CM | POA: Diagnosis present

## 2022-09-04 DIAGNOSIS — K863 Pseudocyst of pancreas: Secondary | ICD-10-CM | POA: Diagnosis present

## 2022-09-04 DIAGNOSIS — Z833 Family history of diabetes mellitus: Secondary | ICD-10-CM

## 2022-09-04 DIAGNOSIS — G40909 Epilepsy, unspecified, not intractable, without status epilepticus: Secondary | ICD-10-CM | POA: Diagnosis present

## 2022-09-04 DIAGNOSIS — Z8249 Family history of ischemic heart disease and other diseases of the circulatory system: Secondary | ICD-10-CM

## 2022-09-04 DIAGNOSIS — E669 Obesity, unspecified: Secondary | ICD-10-CM | POA: Insufficient documentation

## 2022-09-04 DIAGNOSIS — Y929 Unspecified place or not applicable: Secondary | ICD-10-CM

## 2022-09-04 DIAGNOSIS — Z888 Allergy status to other drugs, medicaments and biological substances status: Secondary | ICD-10-CM

## 2022-09-04 DIAGNOSIS — C786 Secondary malignant neoplasm of retroperitoneum and peritoneum: Secondary | ICD-10-CM | POA: Diagnosis present

## 2022-09-04 DIAGNOSIS — K760 Fatty (change of) liver, not elsewhere classified: Secondary | ICD-10-CM | POA: Diagnosis not present

## 2022-09-04 DIAGNOSIS — C3490 Malignant neoplasm of unspecified part of unspecified bronchus or lung: Secondary | ICD-10-CM | POA: Diagnosis present

## 2022-09-04 DIAGNOSIS — K5730 Diverticulosis of large intestine without perforation or abscess without bleeding: Secondary | ICD-10-CM | POA: Diagnosis present

## 2022-09-04 DIAGNOSIS — F419 Anxiety disorder, unspecified: Secondary | ICD-10-CM | POA: Diagnosis present

## 2022-09-04 DIAGNOSIS — C3432 Malignant neoplasm of lower lobe, left bronchus or lung: Secondary | ICD-10-CM | POA: Diagnosis present

## 2022-09-04 DIAGNOSIS — K8020 Calculus of gallbladder without cholecystitis without obstruction: Secondary | ICD-10-CM | POA: Diagnosis not present

## 2022-09-04 DIAGNOSIS — R053 Chronic cough: Secondary | ICD-10-CM | POA: Diagnosis present

## 2022-09-04 DIAGNOSIS — R748 Abnormal levels of other serum enzymes: Secondary | ICD-10-CM | POA: Diagnosis not present

## 2022-09-04 DIAGNOSIS — Z87891 Personal history of nicotine dependence: Secondary | ICD-10-CM

## 2022-09-04 DIAGNOSIS — Z90710 Acquired absence of both cervix and uterus: Secondary | ICD-10-CM

## 2022-09-04 DIAGNOSIS — Z8261 Family history of arthritis: Secondary | ICD-10-CM

## 2022-09-04 DIAGNOSIS — D61811 Other drug-induced pancytopenia: Secondary | ICD-10-CM | POA: Diagnosis present

## 2022-09-04 DIAGNOSIS — I10 Essential (primary) hypertension: Secondary | ICD-10-CM | POA: Diagnosis present

## 2022-09-04 DIAGNOSIS — K851 Biliary acute pancreatitis without necrosis or infection: Secondary | ICD-10-CM

## 2022-09-04 DIAGNOSIS — Z841 Family history of disorders of kidney and ureter: Secondary | ICD-10-CM

## 2022-09-04 DIAGNOSIS — K219 Gastro-esophageal reflux disease without esophagitis: Secondary | ICD-10-CM | POA: Diagnosis present

## 2022-09-04 DIAGNOSIS — N390 Urinary tract infection, site not specified: Principal | ICD-10-CM

## 2022-09-04 DIAGNOSIS — Z6834 Body mass index (BMI) 34.0-34.9, adult: Secondary | ICD-10-CM

## 2022-09-04 DIAGNOSIS — E119 Type 2 diabetes mellitus without complications: Secondary | ICD-10-CM | POA: Diagnosis present

## 2022-09-04 DIAGNOSIS — K8531 Drug induced acute pancreatitis with uninfected necrosis: Principal | ICD-10-CM | POA: Diagnosis present

## 2022-09-04 DIAGNOSIS — E1169 Type 2 diabetes mellitus with other specified complication: Secondary | ICD-10-CM

## 2022-09-04 DIAGNOSIS — T451X5A Adverse effect of antineoplastic and immunosuppressive drugs, initial encounter: Secondary | ICD-10-CM | POA: Diagnosis present

## 2022-09-04 DIAGNOSIS — Z825 Family history of asthma and other chronic lower respiratory diseases: Secondary | ICD-10-CM

## 2022-09-04 DIAGNOSIS — R188 Other ascites: Secondary | ICD-10-CM | POA: Diagnosis not present

## 2022-09-04 DIAGNOSIS — Z7984 Long term (current) use of oral hypoglycemic drugs: Secondary | ICD-10-CM

## 2022-09-04 LAB — GLUCOSE, CAPILLARY: Glucose-Capillary: 152 mg/dL — ABNORMAL HIGH (ref 70–99)

## 2022-09-04 LAB — COMPREHENSIVE METABOLIC PANEL
ALT: 52 U/L — ABNORMAL HIGH (ref 0–44)
AST: 22 U/L (ref 15–41)
Albumin: 2.9 g/dL — ABNORMAL LOW (ref 3.5–5.0)
Alkaline Phosphatase: 59 U/L (ref 38–126)
Anion gap: 10 (ref 5–15)
BUN: 21 mg/dL (ref 8–23)
CO2: 28 mmol/L (ref 22–32)
Calcium: 8.6 mg/dL — ABNORMAL LOW (ref 8.9–10.3)
Chloride: 97 mmol/L — ABNORMAL LOW (ref 98–111)
Creatinine, Ser: 0.54 mg/dL (ref 0.44–1.00)
GFR, Estimated: >60 mL/min (ref >60)
Glucose, Bld: 356 mg/dL — ABNORMAL HIGH (ref 70–99)
Potassium: 3.9 mmol/L (ref 3.5–5.1)
Sodium: 135 mmol/L (ref 135–145)
Total Bilirubin: 0.6 mg/dL (ref 0.3–1.2)
Total Protein: 5.9 g/dL — ABNORMAL LOW (ref 6.5–8.1)

## 2022-09-04 LAB — CBC WITH DIFFERENTIAL/PLATELET
Abs Immature Granulocytes: 0.06 10*3/uL (ref 0.00–0.07)
Basophils Absolute: 0 10*3/uL (ref 0.0–0.1)
Basophils Relative: 0 %
Eosinophils Absolute: 0 10*3/uL (ref 0.0–0.5)
Eosinophils Relative: 0 %
HCT: 35.3 % — ABNORMAL LOW (ref 36.0–46.0)
Hemoglobin: 11.3 g/dL — ABNORMAL LOW (ref 12.0–15.0)
Immature Granulocytes: 1 %
Lymphocytes Relative: 12 %
Lymphs Abs: 0.9 10*3/uL (ref 0.7–4.0)
MCH: 31.6 pg (ref 26.0–34.0)
MCHC: 32 g/dL (ref 30.0–36.0)
MCV: 98.6 fL (ref 80.0–100.0)
Monocytes Absolute: 0.3 10*3/uL (ref 0.1–1.0)
Monocytes Relative: 4 %
Neutro Abs: 6.7 10*3/uL (ref 1.7–7.7)
Neutrophils Relative %: 83 %
Platelets: 195 10*3/uL (ref 150–400)
RBC: 3.58 MIL/uL — ABNORMAL LOW (ref 3.87–5.11)
RDW: 16.7 % — ABNORMAL HIGH (ref 11.5–15.5)
WBC: 8 10*3/uL (ref 4.0–10.5)
nRBC: 0 % (ref 0.0–0.2)

## 2022-09-04 LAB — URINALYSIS, W/ REFLEX TO CULTURE (INFECTION SUSPECTED)
Bilirubin Urine: NEGATIVE
Glucose, UA: >=500 mg/dL — AB
Hgb urine dipstick: NEGATIVE
Ketones, ur: NEGATIVE mg/dL
Nitrite: POSITIVE — AB
Protein, ur: NEGATIVE mg/dL
Specific Gravity, Urine: 1.026 (ref 1.005–1.030)
pH: 5 (ref 5.0–8.0)

## 2022-09-04 LAB — TROPONIN I (HIGH SENSITIVITY)
Troponin I (High Sensitivity): 5 ng/L (ref <18)
Troponin I (High Sensitivity): 5 ng/L (ref <18)

## 2022-09-04 LAB — HEMOGLOBIN A1C
Hgb A1c MFr Bld: 8.4 % — ABNORMAL HIGH (ref 4.8–5.6)
Mean Plasma Glucose: 194.38 mg/dL

## 2022-09-04 LAB — LIPASE, BLOOD: Lipase: 1269 U/L — ABNORMAL HIGH (ref 11–51)

## 2022-09-04 MED ORDER — SODIUM CHLORIDE 0.9 % IV SOLN
1.0000 g | Freq: Once | INTRAVENOUS | Status: AC
  Administered 2022-09-04: 1 g via INTRAVENOUS
  Filled 2022-09-04: qty 10

## 2022-09-04 MED ORDER — SODIUM CHLORIDE 0.9 % IV SOLN
8.0000 mg | Freq: Once | INTRAVENOUS | Status: AC | PRN
  Administered 2022-09-05: 8 mg via INTRAVENOUS
  Filled 2022-09-04: qty 4

## 2022-09-04 MED ORDER — MORPHINE SULFATE (PF) 2 MG/ML IV SOLN
2.0000 mg | INTRAVENOUS | Status: DC | PRN
  Administered 2022-09-04 – 2022-09-05 (×2): 2 mg via INTRAVENOUS
  Filled 2022-09-04 (×2): qty 1

## 2022-09-04 MED ORDER — ONDANSETRON HCL 4 MG/2ML IJ SOLN
4.0000 mg | Freq: Four times a day (QID) | INTRAMUSCULAR | Status: DC | PRN
  Administered 2022-09-07: 4 mg via INTRAVENOUS
  Filled 2022-09-04: qty 2

## 2022-09-04 MED ORDER — INSULIN ASPART 100 UNIT/ML IJ SOLN
0.0000 [IU] | INTRAMUSCULAR | Status: DC
  Administered 2022-09-04 – 2022-09-05 (×3): 2 [IU] via SUBCUTANEOUS
  Administered 2022-09-05: 1 [IU] via SUBCUTANEOUS
  Administered 2022-09-05 – 2022-09-06 (×2): 2 [IU] via SUBCUTANEOUS
  Administered 2022-09-06: 1 [IU] via SUBCUTANEOUS
  Administered 2022-09-06: 2 [IU] via SUBCUTANEOUS
  Administered 2022-09-06: 5 [IU] via SUBCUTANEOUS
  Administered 2022-09-06: 3 [IU] via SUBCUTANEOUS
  Administered 2022-09-07: 9 [IU] via SUBCUTANEOUS
  Administered 2022-09-07: 1 [IU] via SUBCUTANEOUS
  Administered 2022-09-07: 5 [IU] via SUBCUTANEOUS
  Administered 2022-09-07: 3 [IU] via SUBCUTANEOUS
  Administered 2022-09-08 (×2): 2 [IU] via SUBCUTANEOUS
  Administered 2022-09-08 (×2): 5 [IU] via SUBCUTANEOUS
  Administered 2022-09-08: 1 [IU] via SUBCUTANEOUS
  Administered 2022-09-09: 5 [IU] via SUBCUTANEOUS
  Administered 2022-09-09 (×2): 1 [IU] via SUBCUTANEOUS
  Administered 2022-09-09: 2 [IU] via SUBCUTANEOUS
  Administered 2022-09-09: 7 [IU] via SUBCUTANEOUS
  Administered 2022-09-09 – 2022-09-10 (×2): 3 [IU] via SUBCUTANEOUS
  Administered 2022-09-10 (×2): 1 [IU] via SUBCUTANEOUS
  Filled 2022-09-04: qty 0.09

## 2022-09-04 MED ORDER — SODIUM CHLORIDE 0.9% FLUSH
10.0000 mL | INTRAVENOUS | Status: DC | PRN
  Administered 2022-09-12: 10 mL

## 2022-09-04 MED ORDER — MORPHINE SULFATE (PF) 2 MG/ML IV SOLN
2.0000 mg | Freq: Once | INTRAVENOUS | Status: AC
  Administered 2022-09-04: 2 mg via INTRAVENOUS
  Filled 2022-09-04: qty 1

## 2022-09-04 MED ORDER — CHLORHEXIDINE GLUCONATE CLOTH 2 % EX PADS
6.0000 | MEDICATED_PAD | Freq: Every day | CUTANEOUS | Status: DC
  Administered 2022-09-05 – 2022-09-12 (×8): 6 via TOPICAL

## 2022-09-04 MED ORDER — SODIUM CHLORIDE 0.9 % IV BOLUS (SEPSIS)
500.0000 mL | Freq: Once | INTRAVENOUS | Status: AC
  Administered 2022-09-04: 500 mL via INTRAVENOUS

## 2022-09-04 MED ORDER — FOLIC ACID 1 MG PO TABS
1.0000 mg | ORAL_TABLET | Freq: Every day | ORAL | Status: DC
  Administered 2022-09-05 – 2022-09-12 (×8): 1 mg via ORAL
  Filled 2022-09-04 (×8): qty 1

## 2022-09-04 MED ORDER — CLONAZEPAM 0.5 MG PO TABS
0.5000 mg | ORAL_TABLET | Freq: Two times a day (BID) | ORAL | Status: DC
  Administered 2022-09-04 – 2022-09-12 (×16): 0.5 mg via ORAL
  Filled 2022-09-04 (×16): qty 1

## 2022-09-04 MED ORDER — ALBUTEROL SULFATE (2.5 MG/3ML) 0.083% IN NEBU: 3.0000 mL | INHALATION_SOLUTION | Freq: Four times a day (QID) | RESPIRATORY_TRACT | Status: DC | PRN

## 2022-09-04 MED ORDER — LEVETIRACETAM 500 MG PO TABS
1000.0000 mg | ORAL_TABLET | Freq: Two times a day (BID) | ORAL | Status: DC
  Administered 2022-09-04 – 2022-09-12 (×16): 1000 mg via ORAL
  Filled 2022-09-04 (×16): qty 2

## 2022-09-04 MED ORDER — INSULIN GLARGINE-YFGN 100 UNIT/ML ~~LOC~~ SOLN
12.0000 [IU] | Freq: Every day | SUBCUTANEOUS | Status: DC
  Administered 2022-09-05 – 2022-09-11 (×7): 12 [IU] via SUBCUTANEOUS
  Filled 2022-09-04 (×7): qty 0.12

## 2022-09-04 MED ORDER — ONDANSETRON HCL 4 MG/2ML IJ SOLN
4.0000 mg | Freq: Once | INTRAMUSCULAR | Status: AC
  Administered 2022-09-04: 4 mg via INTRAVENOUS
  Filled 2022-09-04: qty 2

## 2022-09-04 MED ORDER — ACETAMINOPHEN 325 MG PO TABS
650.0000 mg | ORAL_TABLET | Freq: Four times a day (QID) | ORAL | Status: DC | PRN
  Administered 2022-09-09 – 2022-09-12 (×5): 650 mg via ORAL
  Filled 2022-09-04 (×5): qty 2

## 2022-09-04 MED ORDER — ALPRAZOLAM 0.5 MG PO TABS
0.5000 mg | ORAL_TABLET | Freq: Once | ORAL | Status: AC | PRN
  Administered 2022-09-05: 0.5 mg via ORAL
  Filled 2022-09-04: qty 1

## 2022-09-04 MED ORDER — ENOXAPARIN SODIUM 40 MG/0.4ML IJ SOSY
40.0000 mg | PREFILLED_SYRINGE | INTRAMUSCULAR | Status: DC
  Administered 2022-09-05 – 2022-09-12 (×8): 40 mg via SUBCUTANEOUS
  Filled 2022-09-04 (×8): qty 0.4

## 2022-09-04 MED ORDER — ACETAMINOPHEN 650 MG RE SUPP: 650.0000 mg | Freq: Four times a day (QID) | RECTAL | Status: DC | PRN

## 2022-09-04 MED ORDER — IOHEXOL 350 MG/ML SOLN
100.0000 mL | Freq: Once | INTRAVENOUS | Status: AC | PRN
  Administered 2022-09-04: 100 mL via INTRAVENOUS

## 2022-09-04 MED ORDER — DEXAMETHASONE 4 MG PO TABS
2.0000 mg | ORAL_TABLET | Freq: Every day | ORAL | Status: DC
  Administered 2022-09-05 – 2022-09-06 (×2): 2 mg via ORAL
  Filled 2022-09-04 (×2): qty 1

## 2022-09-04 MED ORDER — INSULIN ASPART 100 UNIT/ML IJ SOLN
10.0000 [IU] | Freq: Once | INTRAMUSCULAR | Status: AC
  Administered 2022-09-04: 10 [IU] via SUBCUTANEOUS
  Filled 2022-09-04: qty 0.1

## 2022-09-04 MED ORDER — FAMOTIDINE 20 MG PO TABS
20.0000 mg | ORAL_TABLET | Freq: Two times a day (BID) | ORAL | Status: DC
  Administered 2022-09-04 – 2022-09-05 (×2): 20 mg via ORAL
  Filled 2022-09-04 (×2): qty 1

## 2022-09-04 MED ORDER — LACTATED RINGERS IV SOLN: INTRAVENOUS | Status: DC

## 2022-09-04 MED ORDER — MORPHINE SULFATE (PF) 4 MG/ML IV SOLN
4.0000 mg | Freq: Once | INTRAVENOUS | Status: AC
  Administered 2022-09-04: 4 mg via INTRAVENOUS
  Filled 2022-09-04: qty 1

## 2022-09-04 NOTE — ED Notes (Signed)
Pt ambulated to bathroom via wheelchair. Was able to stand and pivot without help. Urine sent to main lab.

## 2022-09-04 NOTE — ED Notes (Signed)
ED TO INPATIENT HANDOFF REPORT  ED Nurse Name and Phone #: Rayvon Char Name/Age/Gender Carolyn Hooper 62 y.o. female Room/Bed: WA03/WA03  Code Status   Code Status: Full Code  Home/SNF/Other Home Patient oriented to: self, place, time, and situation Is this baseline? Yes   Triage Complete: Triage complete  Chief Complaint Acute pancreatitis [K85.90]  Triage Note Pt reports pain between shoulder blades x 4 days worse last night. Hx lung cancer    Allergies Allergies  Allergen Reactions   Adhesive [Tape] Dermatitis   Statins Other (See Comments)    Myalgia, itching    Level of Care/Admitting Diagnosis ED Disposition     ED Disposition  Admit   Condition  --   Comment  Hospital Area: [facility] [[id number]]  Level of Care: Med-Surg [16]  May place patient in observation at [facility] or [facility] if equivalent level of care is available:: No  Covid Evaluation: Asymptomatic - no recent exposure (last 10 days) testing not required  Diagnosis: Acute pancreatitis [577.0.ICD-9-CM]  Admitting Physician: Hillary Bow [[id number]]  Attending Physician: Hillary Bow [[id number]]          B Medical/Surgery History Past Medical History:  Diagnosis Date   Abscess    hydroadenitis superativa   Anxiety    DM (diabetes mellitus) (HCC)    Hyperinsulinemia    Hypertension 1983   gestational - pre-eclampsia   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[facility])    Neuroma    audiotry neuroma   Seizure (HCC)    Vertigo    Past Surgical History:  Procedure Laterality Date   ABDOMINAL HYSTERECTOMY  2006   remaining left ovary   BRONCHIAL BIOPSY  02/19/2022   Procedure: BRONCHIAL BIOPSIES;  Surgeon: Josephine Igo, DO;  Location: [facility];  Service: Pulmonary;;   BRONCHIAL BRUSHINGS  02/19/2022   Procedure: BRONCHIAL BRUSHINGS;  Surgeon: Josephine Igo, DO;  Location: [facility];  Service: Pulmonary;;   BRONCHIAL NEEDLE ASPIRATION BIOPSY  02/19/2022    Procedure: BRONCHIAL NEEDLE ASPIRATION BIOPSIES;  Surgeon: Josephine Igo, DO;  Location: [facility];  Service: Pulmonary;;   IR IMAGING GUIDED PORT INSERTION  03/29/2022   SHOULDER SURGERY Right 1960s     A IV Location/Drains/Wounds Patient Lines/Drains/Airways Status     Active Line/Drains/Airways     Name Placement date Placement time Site Days   Implanted Port 03/29/22 Right Chest 03/29/22  1402  Chest  159   Implanted Port 07/26/22 Right Chest 07/26/22  1259  Chest  40            Intake/Output Last 24 hours No intake or output data in the 24 hours ending 09/04/22 2135  Labs/Imaging Results for orders placed or performed during the hospital encounter of 09/04/22 (from the past 48 hour(s))  Urinalysis, w/ Reflex to Culture (Infection Suspected) -Urine, Clean Catch     Status: Abnormal   Collection Time: 09/04/22  4:31 PM  Result Value Ref Range   Specimen Source URINE, CLEAN CATCH    Color, Urine YELLOW YELLOW   APPearance HAZY (A) CLEAR   Specific Gravity, Urine 1.026 1.005 - 1.030   pH 5.0 5.0 - 8.0   Glucose, UA >=500 (A) NEGATIVE mg/dL   Hgb urine dipstick NEGATIVE NEGATIVE   Bilirubin Urine NEGATIVE NEGATIVE   Ketones, ur NEGATIVE NEGATIVE mg/dL   Protein, ur NEGATIVE NEGATIVE mg/dL   Nitrite POSITIVE (A) NEGATIVE   Leukocytes,Ua SMALL (A) NEGATIVE   RBC /  HPF 0-5 0 - 5 RBC/hpf   WBC, UA 6-10 0 - 5 WBC/hpf    Comment:        Reflex urine culture not performed if WBC <=10, OR if Squamous epithelial cells >5. If Squamous epithelial cells >5 suggest recollection.    Bacteria, UA MANY (A) NONE SEEN   Squamous Epithelial / HPF 0-5 0 - 5 /HPF    Comment: Performed at [facility], [address], [place], [place] [address]  Comprehensive metabolic panel     Status: Abnormal   Collection Time: 09/04/22  5:51 PM  Result Value Ref Range   Sodium 135 135 - 145 mmol/L   Potassium 3.9 3.5 - 5.1 mmol/L   Chloride 97 (L) 98 - 111 mmol/L   CO2  28 22 - 32 mmol/L   Glucose, Bld 356 (H) 70 - 99 mg/dL    Comment: Glucose reference range applies only to samples taken after fasting for at least 8 hours.   BUN 21 8 - 23 mg/dL   Creatinine, Ser 6.04 0.44 - 1.00 mg/dL   Calcium 8.6 (L) 8.9 - 10.3 mg/dL   Total Protein 5.9 (L) 6.5 - 8.1 g/dL   Albumin 2.9 (L) 3.5 - 5.0 g/dL   AST 22 15 - 41 U/L   ALT 52 (H) 0 - 44 U/L   Alkaline Phosphatase 59 38 - 126 U/L   Total Bilirubin 0.6 0.3 - 1.2 mg/dL   GFR, Estimated >54 >09 mL/min    Comment: (NOTE) Calculated using the CKD-EPI Creatinine Equation (2021)    Anion gap 10 5 - 15    Comment: Performed at [facility], [address] [place], [place] [address]  CBC with Differential     Status: Abnormal   Collection Time: 09/04/22  5:51 PM  Result Value Ref Range   WBC 8.0 4.0 - 10.5 K/uL   RBC 3.58 (L) 3.87 - 5.11 MIL/uL   Hemoglobin 11.3 (L) 12.0 - 15.0 g/dL   HCT 47.8 (L) 29.5 - 62.1 %   MCV 98.6 80.0 - 100.0 fL   MCH 31.6 26.0 - 34.0 pg   MCHC 32.0 30.0 - 36.0 g/dL   RDW 30.8 (H) 65.7 - 84.6 %   Platelets 195 150 - 400 K/uL   nRBC 0.0 0.0 - 0.2 %   Neutrophils Relative % 83 %   Neutro Abs 6.7 1.7 - 7.7 K/uL   Lymphocytes Relative 12 %   Lymphs Abs 0.9 0.7 - 4.0 K/uL   Monocytes Relative 4 %   Monocytes Absolute 0.3 0.1 - 1.0 K/uL   Eosinophils Relative 0 %   Eosinophils Absolute 0.0 0.0 - 0.5 K/uL   Basophils Relative 0 %   Basophils Absolute 0.0 0.0 - 0.1 K/uL   Immature Granulocytes 1 %   Abs Immature Granulocytes 0.06 0.00 - 0.07 K/uL    Comment: Performed at [facility], [address] [place], [place] [address]  Lipase, blood     Status: Abnormal   Collection Time: 09/04/22  5:51 PM  Result Value Ref Range   Lipase 1,269 (H) 11 - 51 U/L    Comment: RESULT CONFIRMED BY MANUAL DILUTION Performed at [facility], [address], [place], [place] [address]   Troponin I (High Sensitivity)     Status: None    Collection Time: 09/04/22  5:51 PM  Result Value Ref Range   Troponin I (High Sensitivity) 5 <18 ng/L  Comment: (NOTE) Elevated high sensitivity troponin I (hsTnI) values and significant  changes across serial measurements may suggest ACS but many other  chronic and acute conditions are known to elevate hsTnI results.  Refer to the "Links" section for chest pain algorithms and additional  guidance. Performed at [facility], [address], [place], [place] [address]   Troponin I (High Sensitivity)     Status: None   Collection Time: 09/04/22  5:51 PM  Result Value Ref Range   Troponin I (High Sensitivity) 5 <18 ng/L    Comment: (NOTE) Elevated high sensitivity troponin I (hsTnI) values and significant  changes across serial measurements may suggest ACS but many other  chronic and acute conditions are known to elevate hsTnI results.  Refer to the "Links" section for chest pain algorithms and additional  guidance. Performed at [facility], [address], [place], [place] [address]    CT Angio Chest PE W and/or Wo Contrast  Addendum Date: 09/04/2022   ADDENDUM REPORT: 09/04/2022 21:30 ADDENDUM: There is mild stranding of the upper mesentery and adjacent to the pancreas which may represent acute pancreatitis. Correlation with pancreatic enzymes recommended. Additionally there is a loculated appearing fluid collection measuring approximately 6 x 14 cm along the greater curvature of the stomach most consistent with a pseudocyst. Electronically Signed   By: Elgie Collard M.D.   On: 09/04/2022 21:30   Result Date: 09/04/2022 CLINICAL DATA:  Upper abdominal pain and back pain. History of lung cancer. EXAM: CT ANGIOGRAPHY CHEST CT ABDOMEN AND PELVIS WITH CONTRAST TECHNIQUE: Multidetector CT imaging of the chest was performed using the standard protocol during bolus administration of intravenous contrast. Multiplanar CT image reconstructions and MIPs  were obtained to evaluate the vascular anatomy. Multidetector CT imaging of the abdomen and pelvis was performed using the standard protocol during bolus administration of intravenous contrast. RADIATION DOSE REDUCTION: This exam was performed according to the departmental dose-optimization program which includes automated exposure control, adjustment of the mA and/or kV according to patient size and/or use of iterative reconstruction technique. CONTRAST:  OMNIPAQUE IOHEXOL 350 MG/ML SOLN COMPARISON:  CT abdomen pelvis dated 07/27/2022. chest CT dated 07/15/2022. FINDINGS: CTA CHEST FINDINGS Cardiovascular: There is no cardiomegaly or pericardial effusion. Three-vessel coronary vascular calcification. Mild atherosclerotic calcification of the thoracic aorta. No aneurysmal dilatation or dissection. The origins of the great vessels of the aortic arch appear patent. Right-sided Port-A-Cath with tip in the right atrium close to the cavoatrial junction. No pulmonary artery embolus identified. Mediastinum/Nodes: No hilar or mediastinal adenopathy. The esophagus and the thyroid gland are grossly unremarkable. No mediastinal fluid collection. Lungs/Pleura: Grossly stable left lower lobe mass in keeping with known malignancy. Several scattered pulmonary nodules measure up to 9 mm in the right upper lobe similar to prior CT and most consistent with metastasis. No new consolidation. No pleural effusion or pneumothorax. The central airways are patent. Musculoskeletal: Osteopenia with degenerative changes spine. No acute osseous pathology. Review of the MIP images confirms the above findings. CT ABDOMEN and PELVIS FINDINGS No intra-abdominal free air.  Small ascites. Hepatobiliary: The liver is unremarkable. Focal area of hypoenhancement in the left lobe of the liver centrally, likely fatty infiltration. There is mild biliary dilatation. There is a large rim calcified stone in the gallbladder measuring approximately 5.2 cm  in diameter. No pericholecystic fluid or evidence of acute cholecystitis by CT. Pancreas: Subcentimeter hypodense lesion in the uncinate process of the pancreas appears similar to prior CT  and may represent focal interspersed fat or a side branch IPMN. No active inflammatory changes. No dilatation of the main pancreatic duct or gland atrophy. Spleen: Normal in size without focal abnormality. Adrenals/Urinary Tract: The adrenal glands are unremarkable. There is no hydronephrosis on either side. There is symmetric enhancement and excretion of contrast by both kidneys. The visualized ureters appear unremarkable. The urinary bladder is collapsed. Stomach/Bowel: There is sigmoid diverticulosis and scattered colonic diverticula without active inflammatory changes. There is no bowel obstruction or active inflammation. The appendix is normal. Vascular/Lymphatic: Advanced aortoiliac atherosclerotic disease. The IVC is unremarkable. No portal venous gas. There is no adenopathy. Reproductive: Hysterectomy.  No adnexal masses. Other: None Musculoskeletal: Osteopenia with degenerative changes of the spine. No acute osseous pathology. Review of the MIP images confirms the above findings. IMPRESSION: 1. No acute intrathoracic, abdominal, or pelvic pathology. No CT evidence of pulmonary artery embolus. 2. Grossly stable left lower lobe mass in keeping with known malignancy. 3. Stable pulmonary nodules most consistent with metastasis. 4. Cholelithiasis. 5. Colonic diverticulosis. No bowel obstruction. Normal appendix. 6.  Aortic Atherosclerosis (ICD10-I70.0). Electronically Signed: By: Elgie Collard M.D. On: 09/04/2022 19:59   CT ABDOMEN PELVIS W CONTRAST  Addendum Date: 09/04/2022   ADDENDUM REPORT: 09/04/2022 21:30 ADDENDUM: There is mild stranding of the upper mesentery and adjacent to the pancreas which may represent acute pancreatitis. Correlation with pancreatic enzymes recommended. Additionally there is a loculated  appearing fluid collection measuring approximately 6 x 14 cm along the greater curvature of the stomach most consistent with a pseudocyst. Electronically Signed   By: Elgie Collard M.D.   On: 09/04/2022 21:30   Result Date: 09/04/2022 CLINICAL DATA:  Upper abdominal pain and back pain. History of lung cancer. EXAM: CT ANGIOGRAPHY CHEST CT ABDOMEN AND PELVIS WITH CONTRAST TECHNIQUE: Multidetector CT imaging of the chest was performed using the standard protocol during bolus administration of intravenous contrast. Multiplanar CT image reconstructions and MIPs were obtained to evaluate the vascular anatomy. Multidetector CT imaging of the abdomen and pelvis was performed using the standard protocol during bolus administration of intravenous contrast. RADIATION DOSE REDUCTION: This exam was performed according to the departmental dose-optimization program which includes automated exposure control, adjustment of the mA and/or kV according to patient size and/or use of iterative reconstruction technique. CONTRAST:  OMNIPAQUE IOHEXOL 350 MG/ML SOLN COMPARISON:  CT abdomen pelvis dated 07/27/2022. chest CT dated 07/15/2022. FINDINGS: CTA CHEST FINDINGS Cardiovascular: There is no cardiomegaly or pericardial effusion. Three-vessel coronary vascular calcification. Mild atherosclerotic calcification of the thoracic aorta. No aneurysmal dilatation or dissection. The origins of the great vessels of the aortic arch appear patent. Right-sided Port-A-Cath with tip in the right atrium close to the cavoatrial junction. No pulmonary artery embolus identified. Mediastinum/Nodes: No hilar or mediastinal adenopathy. The esophagus and the thyroid gland are grossly unremarkable. No mediastinal fluid collection. Lungs/Pleura: Grossly stable left lower lobe mass in keeping with known malignancy. Several scattered pulmonary nodules measure up to 9 mm in the right upper lobe similar to prior CT and most consistent with metastasis.  No new consolidation. No pleural effusion or pneumothorax. The central airways are patent. Musculoskeletal: Osteopenia with degenerative changes spine. No acute osseous pathology. Review of the MIP images confirms the above findings. CT ABDOMEN and PELVIS FINDINGS No intra-abdominal free air.  Small ascites. Hepatobiliary: The liver is unremarkable. Focal area of hypoenhancement in the left lobe of the liver centrally, likely fatty infiltration. There is mild biliary dilatation. There is a large  rim calcified stone in the gallbladder measuring approximately 5.2 cm in diameter. No pericholecystic fluid or evidence of acute cholecystitis by CT. Pancreas: Subcentimeter hypodense lesion in the uncinate process of the pancreas appears similar to prior CT and may represent focal interspersed fat or a side branch IPMN. No active inflammatory changes. No dilatation of the main pancreatic duct or gland atrophy. Spleen: Normal in size without focal abnormality. Adrenals/Urinary Tract: The adrenal glands are unremarkable. There is no hydronephrosis on either side. There is symmetric enhancement and excretion of contrast by both kidneys. The visualized ureters appear unremarkable. The urinary bladder is collapsed. Stomach/Bowel: There is sigmoid diverticulosis and scattered colonic diverticula without active inflammatory changes. There is no bowel obstruction or active inflammation. The appendix is normal. Vascular/Lymphatic: Advanced aortoiliac atherosclerotic disease. The IVC is unremarkable. No portal venous gas. There is no adenopathy. Reproductive: Hysterectomy.  No adnexal masses. Other: None Musculoskeletal: Osteopenia with degenerative changes of the spine. No acute osseous pathology. Review of the MIP images confirms the above findings. IMPRESSION: 1. No acute intrathoracic, abdominal, or pelvic pathology. No CT evidence of pulmonary artery embolus. 2. Grossly stable left lower lobe mass in keeping with known  malignancy. 3. Stable pulmonary nodules most consistent with metastasis. 4. Cholelithiasis. 5. Colonic diverticulosis. No bowel obstruction. Normal appendix. 6.  Aortic Atherosclerosis (ICD10-I70.0). Electronically Signed: By: Elgie Collard M.D. On: 09/04/2022 19:59   US Abdomen Limited RUQ (LIVER/GB)  Result Date: 09/04/2022 CLINICAL DATA:  Pancreatitis. EXAM: ULTRASOUND ABDOMEN LIMITED RIGHT UPPER QUADRANT COMPARISON:  CT abdomen pelvis dated 09/04/2022. FINDINGS: Gallbladder: There is a large stone in the gallbladder, better seen on the CT. No gallbladder wall thickening or pericholecystic fluid. Negative sonographic Murphy's sign. Common bile duct: Diameter: 5 mm Liver: There is diffuse increased liver echogenicity most commonly seen in the setting of fatty infiltration. Superimposed inflammation or fibrosis is not excluded. Clinical correlation is recommended. Portal vein is patent on color Doppler imaging with normal direction of blood flow towards the liver. Other: Small ascites. IMPRESSION: 1. Cholelithiasis without sonographic evidence of acute cholecystitis. 2. Fatty liver. 3. Small ascites. Electronically Signed   By: Elgie Collard M.D.   On: 09/04/2022 21:25    Pending Labs Unresulted Labs (From admission, onward)     Start     Ordered   09/05/22 0500  CBC  Tomorrow morning,   R        09/04/22 2040   09/05/22 0500  Comprehensive metabolic panel  Tomorrow morning,   R        09/04/22 2040   09/04/22 2049  Urine Culture (for pregnant, neutropenic or urologic patients or patients with an indwelling urinary catheter)  (Urine Labs)  Once,   R       Question:  Indication  Answer:  Suprapubic pain   09/04/22 2048   09/04/22 2030  Hemoglobin A1c  Once,   URGENT       Comments: To assess prior glycemic control    09/04/22 2030            Vitals/Pain Today's Vitals   09/04/22 2040 09/04/22 2045 09/04/22 2100 09/04/22 2119  BP: 95/78  112/61   Pulse: (!) 58 62 61   Resp:       Temp:      TempSrc:      SpO2: 99% 96% 100%   Weight:      Height:      PainSc:    4     Isolation Precautions  No active isolations  Medications Medications  insulin glargine-yfgn (SEMGLEE) injection 12 Units (has no administration in time range)  levETIRAcetam (KEPPRA) tablet 1,000 mg (1,000 mg Oral Given 09/04/22 2122)  insulin aspart (novoLOG) injection 0-9 Units (has no administration in time range)  dexamethasone (DECADRON) tablet 2 mg (has no administration in time range)  famotidine (PEPCID) tablet 20 mg (20 mg Oral Given 09/04/22 2122)  clonazePAM (KLONOPIN) tablet 0.5 mg (0.5 mg Oral Given 09/04/22 2122)  albuterol (PROVENTIL) (2.5 MG/3ML) 0.083% nebulizer solution 3 mL (has no administration in time range)  folic acid (FOLVITE) tablet 1 mg (has no administration in time range)  enoxaparin (LOVENOX) injection 40 mg (has no administration in time range)  acetaminophen (TYLENOL) tablet 650 mg (has no administration in time range)    Or  acetaminophen (TYLENOL) suppository 650 mg (has no administration in time range)  morphine (PF) 2 MG/ML injection 2-4 mg (has no administration in time range)  lactated ringers infusion ( Intravenous New Bag/Given 09/04/22 2135)  ondansetron (ZOFRAN) injection 4 mg (has no administration in time range)  morphine (PF) 4 MG/ML injection 4 mg (4 mg Intravenous Given 09/04/22 1747)  ondansetron (ZOFRAN) injection 4 mg (4 mg Intravenous Given 09/04/22 1746)  iohexol (OMNIPAQUE) 350 MG/ML injection 100 mL (100 mLs Intravenous Contrast Given 09/04/22 1932)  cefTRIAXone (ROCEPHIN) 1 g in sodium chloride 0.9 % 100 mL IVPB (0 g Intravenous Stopped 09/04/22 2115)  morphine (PF) 2 MG/ML injection 2 mg (2 mg Intravenous Given 09/04/22 2040)  sodium chloride 0.9 % bolus 500 mL (0 mLs Intravenous Stopped 09/04/22 2115)  insulin aspart (novoLOG) injection 10 Units (10 Units Subcutaneous Given 09/04/22 2047)    Mobility walks     Focused Assessments      R Recommendations: See Admitting Provider Note  Report given to:   Additional Notes:

## 2022-09-04 NOTE — ED Provider Notes (Signed)
[place] EMERGENCY DEPARTMENT AT [facility] Provider Note   CSN: [id number] Arrival date & time: 09/04/22  1537     History  Chief Complaint  Patient presents with   Back Pain    KAILENA MANDERFIELD is a 62 y.o. female.  With PMH of DM, HTN, metastatic lung cancer on chemotherapy presenting with back pain and abdominal pain for the last 4 days.  Last chemotherapy infusion yesterday however problem has been ongoing since 3 days prior.  She has been complaining of waxing and waning pain in her upper back between her shoulder blades as well as her upper abdomen.  She endorses some abdominal bloating and distention and known history of gallstone but does not feel like it is related.  Worse with breathing and movement.  She is still able to tolerate p.o. with no vomiting or diarrhea.  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  She has had no fevers or chills.  No urinary symptoms.  Still reports having bowel movements and passing gas.  Unsure if this is related to gas or chemotherapy.  Denies any traumatic injuries.  Has chronic cough from cancer but no hemoptysis.  No history of PE or DVT.  Denies chest pain.   Back Pain      Home Medications Prior to Admission medications   Medication Sig Start Date End Date Taking? Authorizing Provider  albuterol (VENTOLIN HFA) 108 (90 Base) MCG/ACT inhaler Inhale 2 puffs into the lungs every 6 (six) hours as needed. 01/08/21   [name], FNP  clonazePAM (KLONOPIN) 0.5 MG tablet Take 1 tablet (0.5 mg total) by mouth 2 (two) times daily. 08/30/22   [name], FNP  dexamethasone (DECADRON) 1 MG tablet Take 3 tablets by mouth daily with breakfast for 7 days, THEN take 2 tablets daily for 7 days, THEN 1 tablet daily for 7 days. 08/29/22 09/19/22  [name], MD  famotidine (PEPCID) 20 MG tablet Take 1 tablet (20 mg total) by mouth 2 (two) times  daily. 08/22/22   [name], MD  fluconazole (DIFLUCAN) 150 MG tablet Take 1 tablet by mouth every 3 days as needed (yeast infection). 08/29/22   [name], FNP  folic acid (FOLVITE) 1 MG tablet Take 1 tablet (1 mg total) by mouth daily. 05/26/22   [name], PA-C  HYDROcodone-acetaminophen (NORCO) 5-325 MG tablet Take 1 - 2 tablets by mouth every 6 (six) hours as needed for moderate pain. 05/30/22   [name], FNP  insulin glargine (LANTUS SOLOSTAR) 100 UNIT/ML Solostar Pen Inject 12 Units into the skin daily. 08/15/22   [name], FNP  Insulin Pen Needle (PEN NEEDLES) 32G X 4 MM MISC Use to inject insulin daily as directed. 07/09/22   [name], FNP  levETIRAcetam (KEPPRA) 1000 MG tablet Take 1 tablet (1,000 mg total) by mouth 2 (two) times daily. 07/31/22   [name], MD  lidocaine (XYLOCAINE) 5 % ointment Apply topically as needed. 05/30/22   [name], FNP  lidocaine-prilocaine (EMLA) cream Apply to port prior to access 04/25/22   [name], MD  metFORMIN (GLUCOPHAGE) 1000 MG tablet Take 1 tablet (1,000 mg total) by mouth 2 (two) times daily. 05/10/22   [name], FNP  ondansetron (ZOFRAN) 4 MG tablet Take 1 tablet (4 mg total) by mouth every 6 (six) hours as  needed for nausea. Patient taking differently: Take 4-8 mg by mouth every 6 (six) hours as needed for nausea. 07/17/22   [name], [name], MD  prochlorperazine (COMPAZINE) 10 MG tablet Take 1 tablet (10 mg total) by mouth every 6 (six) hours as needed. 05/30/22   Heilingoetter, [name], PA-C  triamcinolone ointment (KENALOG) 0.5 % Apply topically to affected area twice daily as directed, 07/30/22   [name], MD      Allergies    Adhesive [tape] and Statins    Review of Systems   Review of Systems  Musculoskeletal:  Positive for back pain.    Physical Exam Updated Vital Signs BP 131/64 (BP Location: Right Arm)   Pulse 66   Temp 97.9 F (36.6 C) (Oral)   Resp  16   Ht 5\' 8"  (1.727 m)   Wt 103 kg   SpO2 97%   BMI 34.53 kg/m  Physical Exam Constitutional: Alert and oriented.  Chronically ill in appearance and slightly uncomfortable but nontoxic Eyes: Conjunctivae are normal. ENT      Head: Normocephalic and atraumatic. Cardiovascular: S1, S2, regular rate and warm well-perfused Respiratory: Normal respiratory effort. Breath sounds are normal.  O2 sat 96 on RA Gastrointestinal: Soft and nondistended with upper epigastrium tenderness with voluntary guarding, not peritonitic Musculoskeletal: Normal range of motion in all extremities.  No midline spinal tenderness on exam, no reproducible back pain Neurologic: Normal speech and language.  Delayed in speech.  Moving all 4 extremities equally.  Sensation grossly intact.  No gross focal neurologic deficits are appreciated. Skin: Skin is warm, dry and intact. No rash noted. Psychiatric: Mood and affect are normal. Speech and behavior are normal.  ED Results / Procedures / Treatments   Labs (all labs ordered are listed, but only abnormal results are displayed) Labs Reviewed  COMPREHENSIVE METABOLIC PANEL - Abnormal; Notable for the following components:      Result Value   Chloride 97 (*)    Glucose, Bld 356 (*)    Calcium 8.6 (*)    Total Protein 5.9 (*)    Albumin 2.9 (*)    ALT 52 (*)    All other components within normal limits  CBC WITH DIFFERENTIAL/PLATELET - Abnormal; Notable for the following components:   RBC 3.58 (*)    Hemoglobin 11.3 (*)    HCT 35.3 (*)    RDW 16.7 (*)    All other components within normal limits  LIPASE, BLOOD - Abnormal; Notable for the following components:   Lipase 1,269 (*)    All other components within normal limits  URINALYSIS, W/ REFLEX TO CULTURE (INFECTION SUSPECTED) - Abnormal; Notable for the following components:   APPearance HAZY (*)    Glucose, UA >=500 (*)    Nitrite POSITIVE (*)    Leukocytes,Ua SMALL (*)    Bacteria, UA MANY (*)    All  other components within normal limits  HEMOGLOBIN A1C - Abnormal; Notable for the following components:   Hgb A1c MFr Bld 8.4 (*)    All other components within normal limits  CBC - Abnormal; Notable for the following components:   RBC 3.21 (*)    Hemoglobin 10.2 (*)    HCT 31.8 (*)    RDW 16.5 (*)    All other components within normal limits  COMPREHENSIVE METABOLIC PANEL - Abnormal; Notable for the following components:   Glucose, Bld 130 (*)    Calcium 8.1 (*)    Total Protein 5.3 (*)  Albumin 2.6 (*)    ALT 50 (*)    All other components within normal limits  GLUCOSE, CAPILLARY - Abnormal; Notable for the following components:   Glucose-Capillary 152 (*)    All other components within normal limits  GLUCOSE, CAPILLARY - Abnormal; Notable for the following components:   Glucose-Capillary 118 (*)    All other components within normal limits  GLUCOSE, CAPILLARY - Abnormal; Notable for the following components:   Glucose-Capillary 137 (*)    All other components within normal limits  LIPASE, BLOOD - Abnormal; Notable for the following components:   Lipase 582 (*)    All other components within normal limits  GLUCOSE, CAPILLARY - Abnormal; Notable for the following components:   Glucose-Capillary 155 (*)    All other components within normal limits  URINE CULTURE  TROPONIN I (HIGH SENSITIVITY)  TROPONIN I (HIGH SENSITIVITY)    EKG None  Radiology MR ABDOMEN MRCP W WO CONTAST  Result Date: 09/05/2022 CLINICAL DATA:  Pancreatitis suspected, cholelithiasis EXAM: MRI ABDOMEN WITHOUT AND WITH CONTRAST (INCLUDING MRCP) TECHNIQUE: Multiplanar multisequence MR imaging of the abdomen was performed both before and after the administration of intravenous contrast. Heavily T2-weighted images of the biliary and pancreatic ducts were obtained, and three-dimensional MRCP images were rendered by post processing. CONTRAST:  10mL GADAVIST GADOBUTROL 1 MMOL/ML IV SOLN COMPARISON:  CT abdomen  pelvis, right upper quadrant ultrasound, 09/04/2022 CT abdomen pelvis, 07/27/2022 FINDINGS: Lower chest: Medial left lower lobe mass, partially imaged, with associated trace pleural effusion (series 3, image 2). Hepatobiliary: Mild, diffuse hepatic steatosis with focal fatty deposition in the central right lobe of the liver (series 8, image 39). Large gallstone near the gallbladder neck with additional gravel and sludge in the fundus. No gallbladder wall thickening. No biliary ductal dilatation. Suspect at least one tiny gallstone in the distal common bile duct near the ampulla, measuring no greater than 0.3 cm (series 3, image 26). Pancreas: Mild inflammatory fat stranding about the pancreas. Small areas of hypoenhancing fluid signal in the superior pancreatic head, neck, and proximal body, largest individual focus measuring 0.8 cm in the superior pancreatic head (series 5, image 23, 18, series 15, image 55, 43). Spleen: Normal in size without significant abnormality. Adrenals/Urinary Tract: Adrenal glands are unremarkable. Kidneys are normal, without renal calculi, solid lesion, or hydronephrosis. Stomach/Bowel: Large, loculated, rim enhancing fluid collection overlying the greater curvature of the stomach measuring 13.5 x 11.1 x 6.5 cm, new compared to prior examination dated 07/27/2022. (Series 4, image 7, series 3, image 29). Stomach otherwise within normal limits. No evidence of bowel wall thickening, distention, or inflammatory changes. Vascular/Lymphatic: No significant vascular findings are present. No enlarged abdominal lymph nodes. Other: No abdominal wall hernia or abnormality. Trace perihepatic and perisplenic ascites. Musculoskeletal: No acute or significant osseous findings. IMPRESSION: 1. Mild inflammatory fat stranding about the pancreas. Subcentimeter foci of hypoenhancing fluid signal in the superior pancreatic head, pancreatic neck, and proximal pancreatic body, most consistent with small foci of  parenchymal necrosis and developing acute pancreatic fluid collections. 2. Large, loculated, rim enhancing fluid collection overlying the greater curvature of the stomach measuring 13.5 x 11.1 x 6.5 cm, new compared to prior examination dated 07/27/2022 and consistent with an acute pancreatic fluid collection / developing pseudocyst. The presence or absence of infection is not established by imaging. 3. Suspect at least one tiny gallstone in the distal common bile duct near the ampulla, measuring no greater than 0.3 cm. No biliary ductal dilatation. 4.  Cholelithiasis. 5. Medial left lower lobe mass, partially imaged, with associated trace pleural effusion, in keeping with known primary lung malignancy. 6. Hepatic steatosis. These results will be called to the ordering clinician or representative by the Radiologist Assistant, and communication documented in the PACS or [employer]. Electronically Signed   By: Jearld Lesch M.D.   On: 09/05/2022 07:57   MR 3D Recon At Scanner  Result Date: 09/05/2022 CLINICAL DATA:  Pancreatitis suspected, cholelithiasis EXAM: MRI ABDOMEN WITHOUT AND WITH CONTRAST (INCLUDING MRCP) TECHNIQUE: Multiplanar multisequence MR imaging of the abdomen was performed both before and after the administration of intravenous contrast. Heavily T2-weighted images of the biliary and pancreatic ducts were obtained, and three-dimensional MRCP images were rendered by post processing. CONTRAST:  10mL GADAVIST GADOBUTROL 1 MMOL/ML IV SOLN COMPARISON:  CT abdomen pelvis, right upper quadrant ultrasound, 09/04/2022 CT abdomen pelvis, 07/27/2022 FINDINGS: Lower chest: Medial left lower lobe mass, partially imaged, with associated trace pleural effusion (series 3, image 2). Hepatobiliary: Mild, diffuse hepatic steatosis with focal fatty deposition in the central right lobe of the liver (series 8, image 39). Large gallstone near the gallbladder neck with additional gravel and sludge in the fundus. No  gallbladder wall thickening. No biliary ductal dilatation. Suspect at least one tiny gallstone in the distal common bile duct near the ampulla, measuring no greater than 0.3 cm (series 3, image 26). Pancreas: Mild inflammatory fat stranding about the pancreas. Small areas of hypoenhancing fluid signal in the superior pancreatic head, neck, and proximal body, largest individual focus measuring 0.8 cm in the superior pancreatic head (series 5, image 23, 18, series 15, image 55, 43). Spleen: Normal in size without significant abnormality. Adrenals/Urinary Tract: Adrenal glands are unremarkable. Kidneys are normal, without renal calculi, solid lesion, or hydronephrosis. Stomach/Bowel: Large, loculated, rim enhancing fluid collection overlying the greater curvature of the stomach measuring 13.5 x 11.1 x 6.5 cm, new compared to prior examination dated 07/27/2022. (Series 4, image 7, series 3, image 29). Stomach otherwise within normal limits. No evidence of bowel wall thickening, distention, or inflammatory changes. Vascular/Lymphatic: No significant vascular findings are present. No enlarged abdominal lymph nodes. Other: No abdominal wall hernia or abnormality. Trace perihepatic and perisplenic ascites. Musculoskeletal: No acute or significant osseous findings. IMPRESSION: 1. Mild inflammatory fat stranding about the pancreas. Subcentimeter foci of hypoenhancing fluid signal in the superior pancreatic head, pancreatic neck, and proximal pancreatic body, most consistent with small foci of parenchymal necrosis and developing acute pancreatic fluid collections. 2. Large, loculated, rim enhancing fluid collection overlying the greater curvature of the stomach measuring 13.5 x 11.1 x 6.5 cm, new compared to prior examination dated 07/27/2022 and consistent with an acute pancreatic fluid collection / developing pseudocyst. The presence or absence of infection is not established by imaging. 3. Suspect at least one tiny  gallstone in the distal common bile duct near the ampulla, measuring no greater than 0.3 cm. No biliary ductal dilatation. 4. Cholelithiasis. 5. Medial left lower lobe mass, partially imaged, with associated trace pleural effusion, in keeping with known primary lung malignancy. 6. Hepatic steatosis. These results will be called to the ordering clinician or representative by the Radiologist Assistant, and communication documented in the PACS or [employer]. Electronically Signed   By: Jearld Lesch M.D.   On: 09/05/2022 07:57   CT Angio Chest PE W and/or Wo Contrast  Addendum Date: 09/04/2022   ADDENDUM REPORT: 09/04/2022 21:30 ADDENDUM: There is mild stranding of the upper mesentery and adjacent to the  pancreas which may represent acute pancreatitis. Correlation with pancreatic enzymes recommended. Additionally there is a loculated appearing fluid collection measuring approximately 6 x 14 cm along the greater curvature of the stomach most consistent with a pseudocyst. Electronically Signed   By: Elgie Collard M.D.   On: 09/04/2022 21:30   Result Date: 09/04/2022 CLINICAL DATA:  Upper abdominal pain and back pain. History of lung cancer. EXAM: CT ANGIOGRAPHY CHEST CT ABDOMEN AND PELVIS WITH CONTRAST TECHNIQUE: Multidetector CT imaging of the chest was performed using the standard protocol during bolus administration of intravenous contrast. Multiplanar CT image reconstructions and MIPs were obtained to evaluate the vascular anatomy. Multidetector CT imaging of the abdomen and pelvis was performed using the standard protocol during bolus administration of intravenous contrast. RADIATION DOSE REDUCTION: This exam was performed according to the departmental dose-optimization program which includes automated exposure control, adjustment of the mA and/or kV according to patient size and/or use of iterative reconstruction technique. CONTRAST:  OMNIPAQUE IOHEXOL 350 MG/ML SOLN COMPARISON:  CT abdomen  pelvis dated 07/27/2022. chest CT dated 07/15/2022. FINDINGS: CTA CHEST FINDINGS Cardiovascular: There is no cardiomegaly or pericardial effusion. Three-vessel coronary vascular calcification. Mild atherosclerotic calcification of the thoracic aorta. No aneurysmal dilatation or dissection. The origins of the great vessels of the aortic arch appear patent. Right-sided Port-A-Cath with tip in the right atrium close to the cavoatrial junction. No pulmonary artery embolus identified. Mediastinum/Nodes: No hilar or mediastinal adenopathy. The esophagus and the thyroid gland are grossly unremarkable. No mediastinal fluid collection. Lungs/Pleura: Grossly stable left lower lobe mass in keeping with known malignancy. Several scattered pulmonary nodules measure up to 9 mm in the right upper lobe similar to prior CT and most consistent with metastasis. No new consolidation. No pleural effusion or pneumothorax. The central airways are patent. Musculoskeletal: Osteopenia with degenerative changes spine. No acute osseous pathology. Review of the MIP images confirms the above findings. CT ABDOMEN and PELVIS FINDINGS No intra-abdominal free air.  Small ascites. Hepatobiliary: The liver is unremarkable. Focal area of hypoenhancement in the left lobe of the liver centrally, likely fatty infiltration. There is mild biliary dilatation. There is a large rim calcified stone in the gallbladder measuring approximately 5.2 cm in diameter. No pericholecystic fluid or evidence of acute cholecystitis by CT. Pancreas: Subcentimeter hypodense lesion in the uncinate process of the pancreas appears similar to prior CT and may represent focal interspersed fat or a side branch IPMN. No active inflammatory changes. No dilatation of the main pancreatic duct or gland atrophy. Spleen: Normal in size without focal abnormality. Adrenals/Urinary Tract: The adrenal glands are unremarkable. There is no hydronephrosis on either side. There is symmetric  enhancement and excretion of contrast by both kidneys. The visualized ureters appear unremarkable. The urinary bladder is collapsed. Stomach/Bowel: There is sigmoid diverticulosis and scattered colonic diverticula without active inflammatory changes. There is no bowel obstruction or active inflammation. The appendix is normal. Vascular/Lymphatic: Advanced aortoiliac atherosclerotic disease. The IVC is unremarkable. No portal venous gas. There is no adenopathy. Reproductive: Hysterectomy.  No adnexal masses. Other: None Musculoskeletal: Osteopenia with degenerative changes of the spine. No acute osseous pathology. Review of the MIP images confirms the above findings. IMPRESSION: 1. No acute intrathoracic, abdominal, or pelvic pathology. No CT evidence of pulmonary artery embolus. 2. Grossly stable left lower lobe mass in keeping with known malignancy. 3. Stable pulmonary nodules most consistent with metastasis. 4. Cholelithiasis. 5. Colonic diverticulosis. No bowel obstruction. Normal appendix. 6.  Aortic Atherosclerosis (ICD10-I70.0). Electronically Signed:  By: Elgie Collard M.D. On: 09/04/2022 19:59   CT ABDOMEN PELVIS W CONTRAST  Addendum Date: 09/04/2022   ADDENDUM REPORT: 09/04/2022 21:30 ADDENDUM: There is mild stranding of the upper mesentery and adjacent to the pancreas which may represent acute pancreatitis. Correlation with pancreatic enzymes recommended. Additionally there is a loculated appearing fluid collection measuring approximately 6 x 14 cm along the greater curvature of the stomach most consistent with a pseudocyst. Electronically Signed   By: Elgie Collard M.D.   On: 09/04/2022 21:30   Result Date: 09/04/2022 CLINICAL DATA:  Upper abdominal pain and back pain. History of lung cancer. EXAM: CT ANGIOGRAPHY CHEST CT ABDOMEN AND PELVIS WITH CONTRAST TECHNIQUE: Multidetector CT imaging of the chest was performed using the standard protocol during bolus administration of intravenous  contrast. Multiplanar CT image reconstructions and MIPs were obtained to evaluate the vascular anatomy. Multidetector CT imaging of the abdomen and pelvis was performed using the standard protocol during bolus administration of intravenous contrast. RADIATION DOSE REDUCTION: This exam was performed according to the departmental dose-optimization program which includes automated exposure control, adjustment of the mA and/or kV according to patient size and/or use of iterative reconstruction technique. CONTRAST:  OMNIPAQUE IOHEXOL 350 MG/ML SOLN COMPARISON:  CT abdomen pelvis dated 07/27/2022. chest CT dated 07/15/2022. FINDINGS: CTA CHEST FINDINGS Cardiovascular: There is no cardiomegaly or pericardial effusion. Three-vessel coronary vascular calcification. Mild atherosclerotic calcification of the thoracic aorta. No aneurysmal dilatation or dissection. The origins of the great vessels of the aortic arch appear patent. Right-sided Port-A-Cath with tip in the right atrium close to the cavoatrial junction. No pulmonary artery embolus identified. Mediastinum/Nodes: No hilar or mediastinal adenopathy. The esophagus and the thyroid gland are grossly unremarkable. No mediastinal fluid collection. Lungs/Pleura: Grossly stable left lower lobe mass in keeping with known malignancy. Several scattered pulmonary nodules measure up to 9 mm in the right upper lobe similar to prior CT and most consistent with metastasis. No new consolidation. No pleural effusion or pneumothorax. The central airways are patent. Musculoskeletal: Osteopenia with degenerative changes spine. No acute osseous pathology. Review of the MIP images confirms the above findings. CT ABDOMEN and PELVIS FINDINGS No intra-abdominal free air.  Small ascites. Hepatobiliary: The liver is unremarkable. Focal area of hypoenhancement in the left lobe of the liver centrally, likely fatty infiltration. There is mild biliary dilatation. There is a large rim calcified  stone in the gallbladder measuring approximately 5.2 cm in diameter. No pericholecystic fluid or evidence of acute cholecystitis by CT. Pancreas: Subcentimeter hypodense lesion in the uncinate process of the pancreas appears similar to prior CT and may represent focal interspersed fat or a side branch IPMN. No active inflammatory changes. No dilatation of the main pancreatic duct or gland atrophy. Spleen: Normal in size without focal abnormality. Adrenals/Urinary Tract: The adrenal glands are unremarkable. There is no hydronephrosis on either side. There is symmetric enhancement and excretion of contrast by both kidneys. The visualized ureters appear unremarkable. The urinary bladder is collapsed. Stomach/Bowel: There is sigmoid diverticulosis and scattered colonic diverticula without active inflammatory changes. There is no bowel obstruction or active inflammation. The appendix is normal. Vascular/Lymphatic: Advanced aortoiliac atherosclerotic disease. The IVC is unremarkable. No portal venous gas. There is no adenopathy. Reproductive: Hysterectomy.  No adnexal masses. Other: None Musculoskeletal: Osteopenia with degenerative changes of the spine. No acute osseous pathology. Review of the MIP images confirms the above findings. IMPRESSION: 1. No acute intrathoracic, abdominal, or pelvic pathology. No CT evidence of pulmonary artery  embolus. 2. Grossly stable left lower lobe mass in keeping with known malignancy. 3. Stable pulmonary nodules most consistent with metastasis. 4. Cholelithiasis. 5. Colonic diverticulosis. No bowel obstruction. Normal appendix. 6.  Aortic Atherosclerosis (ICD10-I70.0). Electronically Signed: By: Elgie Collard M.D. On: 09/04/2022 19:59   US Abdomen Limited RUQ (LIVER/GB)  Result Date: 09/04/2022 CLINICAL DATA:  Pancreatitis. EXAM: ULTRASOUND ABDOMEN LIMITED RIGHT UPPER QUADRANT COMPARISON:  CT abdomen pelvis dated 09/04/2022. FINDINGS: Gallbladder: There is a large stone in the  gallbladder, better seen on the CT. No gallbladder wall thickening or pericholecystic fluid. Negative sonographic Murphy's sign. Common bile duct: Diameter: 5 mm Liver: There is diffuse increased liver echogenicity most commonly seen in the setting of fatty infiltration. Superimposed inflammation or fibrosis is not excluded. Clinical correlation is recommended. Portal vein is patent on color Doppler imaging with normal direction of blood flow towards the liver. Other: Small ascites. IMPRESSION: 1. Cholelithiasis without sonographic evidence of acute cholecystitis. 2. Fatty liver. 3. Small ascites. Electronically Signed   By: Elgie Collard M.D.   On: 09/04/2022 21:25    Procedures Procedures    Medications Ordered in ED Medications  insulin glargine-yfgn (SEMGLEE) injection 12 Units (12 Units Subcutaneous Given 09/05/22 0852)  levETIRAcetam (KEPPRA) tablet 1,000 mg (1,000 mg Oral Given 09/05/22 0839)  insulin aspart (novoLOG) injection 0-9 Units (1 Units Subcutaneous Given 09/05/22 0853)  dexamethasone (DECADRON) tablet 2 mg (2 mg Oral Given 09/05/22 0841)  clonazePAM (KLONOPIN) tablet 0.5 mg (0.5 mg Oral Given 09/05/22 0839)  albuterol (PROVENTIL) (2.5 MG/3ML) 0.083% nebulizer solution 3 mL (has no administration in time range)  folic acid (FOLVITE) tablet 1 mg (1 mg Oral Given 09/05/22 0840)  enoxaparin (LOVENOX) injection 40 mg (40 mg Subcutaneous Given 09/05/22 0842)  acetaminophen (TYLENOL) tablet 650 mg (has no administration in time range)    Or  acetaminophen (TYLENOL) suppository 650 mg (has no administration in time range)  lactated ringers infusion ( Intravenous New Bag/Given 09/05/22 0550)  ondansetron (ZOFRAN) injection 4 mg (has no administration in time range)  sodium chloride flush (NS) 0.9 % injection 10-40 mL (has no administration in time range)  Chlorhexidine Gluconate Cloth 2 % PADS 6 each (6 each Topical Given 09/05/22 1006)  cefTRIAXone (ROCEPHIN) 1 g in sodium chloride 0.9 %  100 mL IVPB (1 g Intravenous New Bag/Given 09/05/22 0943)  pantoprazole (PROTONIX) injection 40 mg (has no administration in time range)  HYDROmorphone (DILAUDID) injection 1 mg (has no administration in time range)  morphine (PF) 4 MG/ML injection 4 mg (4 mg Intravenous Given 09/04/22 1747)  ondansetron (ZOFRAN) injection 4 mg (4 mg Intravenous Given 09/04/22 1746)  iohexol (OMNIPAQUE) 350 MG/ML injection 100 mL (100 mLs Intravenous Contrast Given 09/04/22 1932)  cefTRIAXone (ROCEPHIN) 1 g in sodium chloride 0.9 % 100 mL IVPB (0 g Intravenous Stopped 09/04/22 2115)  morphine (PF) 2 MG/ML injection 2 mg (2 mg Intravenous Given 09/04/22 2040)  sodium chloride 0.9 % bolus 500 mL (0 mLs Intravenous Stopped 09/04/22 2115)  insulin aspart (novoLOG) injection 10 Units (10 Units Subcutaneous Given 09/04/22 2047)  ALPRAZolam (XANAX) tablet 0.5 mg (0.5 mg Oral Given 09/05/22 0546)  ondansetron (ZOFRAN) 8 mg in sodium chloride 0.9 % 50 mL IVPB (8 mg Intravenous New Bag/Given 09/05/22 0553)  gadobutrol (GADAVIST) 1 MMOL/ML injection 10 mL (10 mLs Intravenous Contrast Given 09/05/22 7829)    ED Course/ Medical Decision Making/ A&P  Medical Decision Making Soumya Potempa Horsford is a 62 y.o. female.  With PMH of DM, HTN, metastatic lung cancer on chemotherapy presenting with back pain and abdominal pain for the last 4 days.  Last chemotherapy infusion yesterday however problem has been ongoing since 3 days prior.  There is no reproducible pain on patient's back however reproducible pain on patient's upper abdomen.  Based on her pain and history of cancer, differentials include but not limited to PE, atypical ACS, bowel obstruction, pancreatitis, cholecystitis among multiple other etiologies.  Unlikely dissection with no reported chest pain, reassuring EKG, no pulse deficits, nontoxic appearance.  Labs reviewed by me notable for elevated glucose 356 with normal bicarbonate no anion gap not  consistent with DKA.  Normal white blood cell count 8.0 with no left shift.  Mild anemia hemoglobin 11.3.  Lipase elevated 1269 consistent with acute pancreatitis.  UA additionally possibly suggestive of UTI with many bacteria positive nitrite small leukocyte Estrace and 6-10 WBCs started on Rocephin.  CTA PE study negative for PE.  CTAP with contrast evidence of cholelithiasis with without cholecystitis.  Followed up with right upper quadrant ultrasound due to acute pancreatitis.  Discussed with hospitalist for admission for continued management of acute pancreatitis and UTI.    Amount and/or Complexity of Data Reviewed Labs: ordered. Radiology: ordered.  Risk Prescription drug management. Decision regarding hospitalization.     Final Clinical Impression(s) / ED Diagnoses Final diagnoses:  Urinary tract infection without hematuria, site unspecified  Acute pancreatitis, unspecified complication status, unspecified pancreatitis type  Upper back pain    Rx / DC Orders ED Discharge Orders     None         Mardene Sayer, MD 09/05/22 1143

## 2022-09-04 NOTE — Assessment & Plan Note (Addendum)
?   Gallstone pancreatitis?  Pt with known cholelithiasis. Side effect of Keytruda also possible but pancreatitis seen in <1% of treatment cases according to UTD. RUQ [place] pending MRCP if negative. NPO after MN IVF Morphine PRN pain Zofran PRN nausea

## 2022-09-04 NOTE — Assessment & Plan Note (Signed)
Putting consult into Epic to let onc know pt getting admitted.

## 2022-09-04 NOTE — Telephone Encounter (Signed)
This nurse received a call from this patient stating that she has been having abdominal pain but the pain she had last night had her doubled over and she had to call 911.  By the time EMS arrived the pain had eased up some.  She would like to know is there anything that she can take besides Kaopectate or Gas-X.  Advised that the provider will be made aware for recommendations.  No further questions or concerns at this time.  Will follow up with patient.

## 2022-09-04 NOTE — Assessment & Plan Note (Signed)
Cont keppra Cont decadron

## 2022-09-04 NOTE — Assessment & Plan Note (Signed)
?   Of UTI Got 1 dose rocephin in ED UA seems equivocal Symptoms seem more consistent with pancreatitis than UTI Will order UCx

## 2022-09-04 NOTE — ED Notes (Signed)
Clean and gowned pt for CT, due to possibility of fleas. Bed linen changed as well

## 2022-09-04 NOTE — ED Notes (Signed)
Called lab for urine culture for urine sent down.

## 2022-09-04 NOTE — ED Notes (Signed)
Delay in blood draw and medication administration. Patient would like her port accessed, but needs a specific dressing. Currently waiting to hear back from oncology.

## 2022-09-04 NOTE — ED Triage Notes (Signed)
Pt reports pain between shoulder blades x 4 days worse last night. Hx lung cancer

## 2022-09-04 NOTE — H&P (Signed)
History and Physical    Patient: Carolyn Hooper ZOX:[id number] DOB: 02/13/1961 DOA: 09/04/2022 DOS: the patient was seen and examined on 09/04/2022 PCP: [name], FNP  Patient coming from: Home  Chief Complaint:  Chief Complaint  Patient presents with   Back Pain   HPI: Carolyn Hooper is a 62 y.o. 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  Currently on immunotherapy / chemotherapy with Dr. Shirline Frees.  Also on Decadron taper for brain mets.  Had chemo/immunotherapy yesterday.  Pt in to ED with c/o epigastric abd pain ongoing for last 4 days.  Problem ongoing 3 days prior to last chemo/immunotherapy.  She has been complaining of waxing and waning pain in her upper back between her shoulder blades as well as her upper abdomen. She endorses some abdominal bloating and distention and known history of gallstone but does not feel like it is related. Worse with breathing and movement. She is still able to tolerate p.o. with no vomiting or diarrhea.  No dysuria.  Is having BMs and passing gas.  No h/o PE nor DVT.  Review of Systems: As mentioned in the history of present illness. All other systems reviewed and are negative. Past Medical History:  Diagnosis Date   Abscess    hydroadenitis superativa   Anxiety    DM (diabetes mellitus) (HCC)    Hyperinsulinemia    Hypertension 1983   gestational - pre-eclampsia   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[facility])    Neuroma    audiotry neuroma   Seizure (HCC)    Vertigo    Past Surgical History:  Procedure Laterality Date   ABDOMINAL HYSTERECTOMY  2006   remaining left ovary   BRONCHIAL BIOPSY  02/19/2022   Procedure: BRONCHIAL BIOPSIES;  Surgeon: Josephine Igo, DO;  Location: [facility] ENDOSCOPY;  Service: Pulmonary;;   BRONCHIAL BRUSHINGS  02/19/2022   Procedure: BRONCHIAL BRUSHINGS;  Surgeon: Josephine Igo, DO;  Location: [facility];  Service: Pulmonary;;   BRONCHIAL NEEDLE  ASPIRATION BIOPSY  02/19/2022   Procedure: BRONCHIAL NEEDLE ASPIRATION BIOPSIES;  Surgeon: Josephine Igo, DO;  Location: [facility] ENDOSCOPY;  Service: Pulmonary;;   IR IMAGING GUIDED PORT INSERTION  03/29/2022   SHOULDER SURGERY Right 1960s   Social History:  reports that she quit smoking about 6 months ago. Her smoking use included cigarettes. She smoked an average of .75 packs per day. She has never used smokeless tobacco. She reports current alcohol use. She reports that she does not use drugs.  Allergies  Allergen Reactions   Adhesive [Tape] Dermatitis   Statins Other (See Comments)    Myalgia, itching    Family History  Problem Relation Age of Onset   COPD Mother    Heart disease Father    Diabetes Father    Kidney disease Father    Arthritis Father        RA   Liver disease Father     Prior to Admission medications   Medication Sig Start Date End Date Taking? Authorizing Provider  albuterol (VENTOLIN HFA) 108 (90 Base) MCG/ACT inhaler Inhale 2 puffs into the lungs every 6 (six) hours as needed. 01/08/21   [name], FNP  clonazePAM (KLONOPIN) 0.5 MG tablet Take 1 tablet (0.5 mg total) by mouth 2 (two) times daily. 08/30/22   [name], FNP  dexamethasone (DECADRON) 1 MG tablet Take 3 tablets by mouth daily with breakfast for 7 days, THEN take 2  tablets daily for 7 days, THEN 1 tablet daily for 7 days. 08/29/22 09/19/22  [name], MD  famotidine (PEPCID) 20 MG tablet Take 1 tablet (20 mg total) by mouth 2 (two) times daily. 08/22/22   [name], MD  fluconazole (DIFLUCAN) 150 MG tablet Take 1 tablet by mouth every 3 days as needed (yeast infection). 08/29/22   [name], FNP  folic acid (FOLVITE) 1 MG tablet Take 1 tablet (1 mg total) by mouth daily. 05/26/22   [name], PA-C  HYDROcodone-acetaminophen (NORCO) 5-325 MG tablet Take 1 - 2 tablets by mouth every 6 (six) hours as needed for moderate pain. 05/30/22   [name], FNP  insulin  glargine (LANTUS SOLOSTAR) 100 UNIT/ML Solostar Pen Inject 12 Units into the skin daily. 08/15/22   [name], FNP  Insulin Pen Needle (PEN NEEDLES) 32G X 4 MM MISC Use to inject insulin daily as directed. 07/09/22   [name], FNP  levETIRAcetam (KEPPRA) 1000 MG tablet Take 1 tablet (1,000 mg total) by mouth 2 (two) times daily. 07/31/22   [name], MD  lidocaine (XYLOCAINE) 5 % ointment Apply topically as needed. 05/30/22   [name], FNP  lidocaine-prilocaine (EMLA) cream Apply to port prior to access 04/25/22   [name], MD  metFORMIN (GLUCOPHAGE) 1000 MG tablet Take 1 tablet (1,000 mg total) by mouth 2 (two) times daily. 05/10/22   [name], FNP  ondansetron (ZOFRAN) 4 MG tablet Take 1 tablet (4 mg total) by mouth every 6 (six) hours as needed for nausea. Patient taking differently: Take 4-8 mg by mouth every 6 (six) hours as needed for nausea. 07/17/22   [name], [name], MD  prochlorperazine (COMPAZINE) 10 MG tablet Take 1 tablet (10 mg total) by mouth every 6 (six) hours as needed. 05/30/22   Heilingoetter, [name], PA-C  triamcinolone ointment (KENALOG) 0.5 % Apply topically to affected area twice daily as directed, 07/30/22   [name], MD    Physical Exam: Vitals:   09/04/22 1830 09/04/22 1900 09/04/22 2040 09/04/22 2045  BP: 132/63  95/78   Pulse: 63  (!) 58 62  Resp: 14     Temp:  98.2 F (36.8 C)    TempSrc:  Oral    SpO2: 96%  99% 96%  Weight:      Height:       Constitutional: NAD, calm, comfortable Respiratory: clear to auscultation bilaterally, no wheezing, no crackles. Normal respiratory effort. No accessory muscle use.  Cardiovascular: Regular rate and rhythm, no murmurs / rubs / gallops. No extremity edema. 2+ pedal pulses. No carotid bruits.  Abdomen: Epigastric TTP, no guarding nor rebound. Neurologic: CN 2-12 grossly intact. Sensation intact, DTR normal. Strength 5/5 in all 4.  Psychiatric: Normal judgment and  insight. Alert and oriented x 3. Normal mood.   Data Reviewed:    Labs on Admission: I have personally reviewed following labs and imaging studies  CBC: Recent Labs  Lab 09/03/22 0725 09/04/22 1751  WBC 8.6 8.0  NEUTROABS 6.0 6.7  HGB 11.6* 11.3*  HCT 36.0 35.3*  MCV 99.2 98.6  PLT 191 195   Basic Metabolic Panel: Recent Labs  Lab 09/03/22 0725 09/04/22 1751  NA 137 135  K 4.0 3.9  CL 100 97*  CO2 30 28  GLUCOSE 249* 356*  BUN 19 21  CREATININE 0.61 0.54  CALCIUM 8.8* 8.6*   GFR: Estimated Creatinine Clearance: 91.5 mL/min (by  C-G formula based on SCr of 0.54 mg/dL). Liver Function Tests: Recent Labs  Lab 09/03/22 0725 09/04/22 1751  AST 19 22  ALT 58* 52*  ALKPHOS 61 59  BILITOT 0.6 0.6  PROT 5.5* 5.9*  ALBUMIN 3.1* 2.9*   Recent Labs  Lab 09/04/22 1751  LIPASE 1,269*   No results for input(s): "AMMONIA" in the last 168 hours. Coagulation Profile: No results for input(s): "INR", "PROTIME" in the last 168 hours. Cardiac Enzymes: No results for input(s): "CKTOTAL", "CKMB", "CKMBINDEX", "TROPONINI" in the last 168 hours. BNP (last 3 results) No results for input(s): "PROBNP" in the last 8760 hours. HbA1C: No results for input(s): "HGBA1C" in the last 72 hours. CBG: No results for input(s): "GLUCAP" in the last 168 hours. Lipid Profile: No results for input(s): "CHOL", "HDL", "LDLCALC", "TRIG", "CHOLHDL", "LDLDIRECT" in the last 72 hours. Thyroid Function Tests: Recent Labs    09/03/22 0725  TSH 4.759*   Anemia Panel: No results for input(s): "VITAMINB12", "FOLATE", "FERRITIN", "TIBC", "IRON", "RETICCTPCT" in the last 72 hours. Urine analysis:    Component Value Date/Time   COLORURINE YELLOW 09/04/2022 1631   APPEARANCEUR HAZY (A) 09/04/2022 1631   LABSPEC 1.026 09/04/2022 1631   PHURINE 5.0 09/04/2022 1631   GLUCOSEU >=500 (A) 09/04/2022 1631   HGBUR NEGATIVE 09/04/2022 1631   BILIRUBINUR NEGATIVE 09/04/2022 1631   KETONESUR NEGATIVE  09/04/2022 1631   PROTEINUR NEGATIVE 09/04/2022 1631   UROBILINOGEN 0.2 04/07/2011 2356   NITRITE POSITIVE (A) 09/04/2022 1631   LEUKOCYTESUR SMALL (A) 09/04/2022 1631    Radiological Exams on Admission: CT Angio Chest PE W and/or Wo Contrast  Result Date: 09/04/2022 CLINICAL DATA:  Upper abdominal pain and back pain. History of lung cancer. EXAM: CT ANGIOGRAPHY CHEST CT ABDOMEN AND PELVIS WITH CONTRAST TECHNIQUE: Multidetector CT imaging of the chest was performed using the standard protocol during bolus administration of intravenous contrast. Multiplanar CT image reconstructions and MIPs were obtained to evaluate the vascular anatomy. Multidetector CT imaging of the abdomen and pelvis was performed using the standard protocol during bolus administration of intravenous contrast. RADIATION DOSE REDUCTION: This exam was performed according to the departmental dose-optimization program which includes automated exposure control, adjustment of the mA and/or kV according to patient size and/or use of iterative reconstruction technique. CONTRAST:  OMNIPAQUE IOHEXOL 350 MG/ML SOLN COMPARISON:  CT abdomen pelvis dated 07/27/2022. chest CT dated 07/15/2022. FINDINGS: CTA CHEST FINDINGS Cardiovascular: There is no cardiomegaly or pericardial effusion. Three-vessel coronary vascular calcification. Mild atherosclerotic calcification of the thoracic aorta. No aneurysmal dilatation or dissection. The origins of the great vessels of the aortic arch appear patent. Right-sided Port-A-Cath with tip in the right atrium close to the cavoatrial junction. No pulmonary artery embolus identified. Mediastinum/Nodes: No hilar or mediastinal adenopathy. The esophagus and the thyroid gland are grossly unremarkable. No mediastinal fluid collection. Lungs/Pleura: Grossly stable left lower lobe mass in keeping with known malignancy. Several scattered pulmonary nodules measure up to 9 mm in the right upper lobe similar to prior CT  and most consistent with metastasis. No new consolidation. No pleural effusion or pneumothorax. The central airways are patent. Musculoskeletal: Osteopenia with degenerative changes spine. No acute osseous pathology. Review of the MIP images confirms the above findings. CT ABDOMEN and PELVIS FINDINGS No intra-abdominal free air.  Small ascites. Hepatobiliary: The liver is unremarkable. Focal area of hypoenhancement in the left lobe of the liver centrally, likely fatty infiltration. There is mild biliary dilatation. There is a large rim calcified stone  in the gallbladder measuring approximately 5.2 cm in diameter. No pericholecystic fluid or evidence of acute cholecystitis by CT. Pancreas: Subcentimeter hypodense lesion in the uncinate process of the pancreas appears similar to prior CT and may represent focal interspersed fat or a side branch IPMN. No active inflammatory changes. No dilatation of the main pancreatic duct or gland atrophy. Spleen: Normal in size without focal abnormality. Adrenals/Urinary Tract: The adrenal glands are unremarkable. There is no hydronephrosis on either side. There is symmetric enhancement and excretion of contrast by both kidneys. The visualized ureters appear unremarkable. The urinary bladder is collapsed. Stomach/Bowel: There is sigmoid diverticulosis and scattered colonic diverticula without active inflammatory changes. There is no bowel obstruction or active inflammation. The appendix is normal. Vascular/Lymphatic: Advanced aortoiliac atherosclerotic disease. The IVC is unremarkable. No portal venous gas. There is no adenopathy. Reproductive: Hysterectomy.  No adnexal masses. Other: None Musculoskeletal: Osteopenia with degenerative changes of the spine. No acute osseous pathology. Review of the MIP images confirms the above findings. IMPRESSION: 1. No acute intrathoracic, abdominal, or pelvic pathology. No CT evidence of pulmonary artery embolus. 2. Grossly stable left lower lobe  mass in keeping with known malignancy. 3. Stable pulmonary nodules most consistent with metastasis. 4. Cholelithiasis. 5. Colonic diverticulosis. No bowel obstruction. Normal appendix. 6.  Aortic Atherosclerosis (ICD10-I70.0). Electronically Signed   By: Elgie Collard M.D.   On: 09/04/2022 19:59   CT ABDOMEN PELVIS W CONTRAST  Result Date: 09/04/2022 CLINICAL DATA:  Upper abdominal pain and back pain. History of lung cancer. EXAM: CT ANGIOGRAPHY CHEST CT ABDOMEN AND PELVIS WITH CONTRAST TECHNIQUE: Multidetector CT imaging of the chest was performed using the standard protocol during bolus administration of intravenous contrast. Multiplanar CT image reconstructions and MIPs were obtained to evaluate the vascular anatomy. Multidetector CT imaging of the abdomen and pelvis was performed using the standard protocol during bolus administration of intravenous contrast. RADIATION DOSE REDUCTION: This exam was performed according to the departmental dose-optimization program which includes automated exposure control, adjustment of the mA and/or kV according to patient size and/or use of iterative reconstruction technique. CONTRAST:  OMNIPAQUE IOHEXOL 350 MG/ML SOLN COMPARISON:  CT abdomen pelvis dated 07/27/2022. chest CT dated 07/15/2022. FINDINGS: CTA CHEST FINDINGS Cardiovascular: There is no cardiomegaly or pericardial effusion. Three-vessel coronary vascular calcification. Mild atherosclerotic calcification of the thoracic aorta. No aneurysmal dilatation or dissection. The origins of the great vessels of the aortic arch appear patent. Right-sided Port-A-Cath with tip in the right atrium close to the cavoatrial junction. No pulmonary artery embolus identified. Mediastinum/Nodes: No hilar or mediastinal adenopathy. The esophagus and the thyroid gland are grossly unremarkable. No mediastinal fluid collection. Lungs/Pleura: Grossly stable left lower lobe mass in keeping with known malignancy. Several scattered  pulmonary nodules measure up to 9 mm in the right upper lobe similar to prior CT and most consistent with metastasis. No new consolidation. No pleural effusion or pneumothorax. The central airways are patent. Musculoskeletal: Osteopenia with degenerative changes spine. No acute osseous pathology. Review of the MIP images confirms the above findings. CT ABDOMEN and PELVIS FINDINGS No intra-abdominal free air.  Small ascites. Hepatobiliary: The liver is unremarkable. Focal area of hypoenhancement in the left lobe of the liver centrally, likely fatty infiltration. There is mild biliary dilatation. There is a large rim calcified stone in the gallbladder measuring approximately 5.2 cm in diameter. No pericholecystic fluid or evidence of acute cholecystitis by CT. Pancreas: Subcentimeter hypodense lesion in the uncinate process of the pancreas appears similar to  prior CT and may represent focal interspersed fat or a side branch IPMN. No active inflammatory changes. No dilatation of the main pancreatic duct or gland atrophy. Spleen: Normal in size without focal abnormality. Adrenals/Urinary Tract: The adrenal glands are unremarkable. There is no hydronephrosis on either side. There is symmetric enhancement and excretion of contrast by both kidneys. The visualized ureters appear unremarkable. The urinary bladder is collapsed. Stomach/Bowel: There is sigmoid diverticulosis and scattered colonic diverticula without active inflammatory changes. There is no bowel obstruction or active inflammation. The appendix is normal. Vascular/Lymphatic: Advanced aortoiliac atherosclerotic disease. The IVC is unremarkable. No portal venous gas. There is no adenopathy. Reproductive: Hysterectomy.  No adnexal masses. Other: None Musculoskeletal: Osteopenia with degenerative changes of the spine. No acute osseous pathology. Review of the MIP images confirms the above findings. IMPRESSION: 1. No acute intrathoracic, abdominal, or pelvic  pathology. No CT evidence of pulmonary artery embolus. 2. Grossly stable left lower lobe mass in keeping with known malignancy. 3. Stable pulmonary nodules most consistent with metastasis. 4. Cholelithiasis. 5. Colonic diverticulosis. No bowel obstruction. Normal appendix. 6.  Aortic Atherosclerosis (ICD10-I70.0). Electronically Signed   By: Elgie Collard M.D.   On: 09/04/2022 19:59    EKG: Independently reviewed.   Assessment and Plan: * Acute pancreatitis ? Gallstone pancreatitis?  Pt with known cholelithiasis. Side effect of Keytruda also possible but pancreatitis seen in <1% of treatment cases according to UTD. RUQ [place] pending MRCP if negative. NPO after MN IVF Morphine PRN pain Zofran PRN nausea  Pyuria ? 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  Uncontrolled type 2 diabetes mellitus with hyperglycemia, without long-term current use of insulin (HCC) Cont home lantus 12u daily Sensitive SSI Q4H      Advance Care Planning:   Code Status: Full Code  Consults: IP consult to onc put into Epic (not called)  Family Communication: No family in room  Severity of Illness: The appropriate patient status for this patient is OBSERVATION. Observation status is judged to be reasonable and necessary in order to provide the required intensity of service to ensure the patient's safety. The patient's presenting symptoms, physical exam findings, and initial radiographic and laboratory data in the context of their medical condition is felt to place them at decreased risk for further clinical deterioration. Furthermore, it is anticipated that the patient will be medically stable for discharge from the hospital within 2 midnights of  admission.   Author: Hillary Bow., DO 09/04/2022 8:50 PM  For on call review [email or web address].

## 2022-09-04 NOTE — Assessment & Plan Note (Signed)
Cont home lantus 12u daily Sensitive SSI Q4H

## 2022-09-05 ENCOUNTER — Observation Stay (HOSPITAL_COMMUNITY): Payer: BC Managed Care – PPO

## 2022-09-05 ENCOUNTER — Other Ambulatory Visit: Payer: Self-pay

## 2022-09-05 DIAGNOSIS — E1165 Type 2 diabetes mellitus with hyperglycemia: Secondary | ICD-10-CM | POA: Diagnosis present

## 2022-09-05 DIAGNOSIS — Z888 Allergy status to other drugs, medicaments and biological substances status: Secondary | ICD-10-CM | POA: Diagnosis not present

## 2022-09-05 DIAGNOSIS — Y929 Unspecified place or not applicable: Secondary | ICD-10-CM | POA: Diagnosis not present

## 2022-09-05 DIAGNOSIS — K8020 Calculus of gallbladder without cholecystitis without obstruction: Secondary | ICD-10-CM | POA: Diagnosis not present

## 2022-09-05 DIAGNOSIS — Z8249 Family history of ischemic heart disease and other diseases of the circulatory system: Secondary | ICD-10-CM | POA: Diagnosis not present

## 2022-09-05 DIAGNOSIS — K760 Fatty (change of) liver, not elsewhere classified: Secondary | ICD-10-CM | POA: Diagnosis not present

## 2022-09-05 DIAGNOSIS — Z833 Family history of diabetes mellitus: Secondary | ICD-10-CM | POA: Diagnosis not present

## 2022-09-05 DIAGNOSIS — T451X5A Adverse effect of antineoplastic and immunosuppressive drugs, initial encounter: Secondary | ICD-10-CM | POA: Diagnosis present

## 2022-09-05 DIAGNOSIS — Z825 Family history of asthma and other chronic lower respiratory diseases: Secondary | ICD-10-CM | POA: Diagnosis not present

## 2022-09-05 DIAGNOSIS — K859 Acute pancreatitis without necrosis or infection, unspecified: Secondary | ICD-10-CM | POA: Diagnosis present

## 2022-09-05 DIAGNOSIS — E669 Obesity, unspecified: Secondary | ICD-10-CM | POA: Diagnosis present

## 2022-09-05 DIAGNOSIS — K8511 Biliary acute pancreatitis with uninfected necrosis: Secondary | ICD-10-CM | POA: Diagnosis present

## 2022-09-05 DIAGNOSIS — I10 Essential (primary) hypertension: Secondary | ICD-10-CM | POA: Diagnosis present

## 2022-09-05 DIAGNOSIS — D61811 Other drug-induced pancytopenia: Secondary | ICD-10-CM | POA: Diagnosis present

## 2022-09-05 DIAGNOSIS — Z6834 Body mass index (BMI) 34.0-34.9, adult: Secondary | ICD-10-CM | POA: Diagnosis not present

## 2022-09-05 DIAGNOSIS — G40909 Epilepsy, unspecified, not intractable, without status epilepticus: Secondary | ICD-10-CM | POA: Diagnosis present

## 2022-09-05 DIAGNOSIS — K8591 Acute pancreatitis with uninfected necrosis, unspecified: Secondary | ICD-10-CM | POA: Diagnosis not present

## 2022-09-05 DIAGNOSIS — Z841 Family history of disorders of kidney and ureter: Secondary | ICD-10-CM | POA: Diagnosis not present

## 2022-09-05 DIAGNOSIS — K863 Pseudocyst of pancreas: Secondary | ICD-10-CM | POA: Diagnosis present

## 2022-09-05 DIAGNOSIS — Z90710 Acquired absence of both cervix and uterus: Secondary | ICD-10-CM | POA: Diagnosis not present

## 2022-09-05 DIAGNOSIS — Z7984 Long term (current) use of oral hypoglycemic drugs: Secondary | ICD-10-CM | POA: Diagnosis not present

## 2022-09-05 DIAGNOSIS — Z87891 Personal history of nicotine dependence: Secondary | ICD-10-CM | POA: Diagnosis not present

## 2022-09-05 DIAGNOSIS — Z8261 Family history of arthritis: Secondary | ICD-10-CM | POA: Diagnosis not present

## 2022-09-05 DIAGNOSIS — C3432 Malignant neoplasm of lower lobe, left bronchus or lung: Secondary | ICD-10-CM | POA: Diagnosis present

## 2022-09-05 DIAGNOSIS — K8531 Drug induced acute pancreatitis with uninfected necrosis: Secondary | ICD-10-CM | POA: Diagnosis present

## 2022-09-05 DIAGNOSIS — Z794 Long term (current) use of insulin: Secondary | ICD-10-CM | POA: Diagnosis not present

## 2022-09-05 DIAGNOSIS — R935 Abnormal findings on diagnostic imaging of other abdominal regions, including retroperitoneum: Secondary | ICD-10-CM | POA: Diagnosis not present

## 2022-09-05 DIAGNOSIS — C7931 Secondary malignant neoplasm of brain: Secondary | ICD-10-CM | POA: Diagnosis present

## 2022-09-05 DIAGNOSIS — D61818 Other pancytopenia: Secondary | ICD-10-CM | POA: Diagnosis present

## 2022-09-05 DIAGNOSIS — C3490 Malignant neoplasm of unspecified part of unspecified bronchus or lung: Secondary | ICD-10-CM | POA: Diagnosis not present

## 2022-09-05 DIAGNOSIS — C786 Secondary malignant neoplasm of retroperitoneum and peritoneum: Secondary | ICD-10-CM | POA: Diagnosis present

## 2022-09-05 LAB — COMPREHENSIVE METABOLIC PANEL
ALT: 50 U/L — ABNORMAL HIGH (ref 0–44)
AST: 25 U/L (ref 15–41)
Albumin: 2.6 g/dL — ABNORMAL LOW (ref 3.5–5.0)
Alkaline Phosphatase: 51 U/L (ref 38–126)
Anion gap: 6 (ref 5–15)
BUN: 17 mg/dL (ref 8–23)
CO2: 29 mmol/L (ref 22–32)
Calcium: 8.1 mg/dL — ABNORMAL LOW (ref 8.9–10.3)
Chloride: 103 mmol/L (ref 98–111)
Creatinine, Ser: 0.57 mg/dL (ref 0.44–1.00)
GFR, Estimated: >60 mL/min (ref >60)
Glucose, Bld: 130 mg/dL — ABNORMAL HIGH (ref 70–99)
Potassium: 3.5 mmol/L (ref 3.5–5.1)
Sodium: 138 mmol/L (ref 135–145)
Total Bilirubin: 0.9 mg/dL (ref 0.3–1.2)
Total Protein: 5.3 g/dL — ABNORMAL LOW (ref 6.5–8.1)

## 2022-09-05 LAB — CBC
HCT: 31.8 % — ABNORMAL LOW (ref 36.0–46.0)
Hemoglobin: 10.2 g/dL — ABNORMAL LOW (ref 12.0–15.0)
MCH: 31.8 pg (ref 26.0–34.0)
MCHC: 32.1 g/dL (ref 30.0–36.0)
MCV: 99.1 fL (ref 80.0–100.0)
Platelets: 153 10*3/uL (ref 150–400)
RBC: 3.21 MIL/uL — ABNORMAL LOW (ref 3.87–5.11)
RDW: 16.5 % — ABNORMAL HIGH (ref 11.5–15.5)
WBC: 7.7 10*3/uL (ref 4.0–10.5)
nRBC: 0 % (ref 0.0–0.2)

## 2022-09-05 LAB — GLUCOSE, CAPILLARY
Glucose-Capillary: 118 mg/dL — ABNORMAL HIGH (ref 70–99)
Glucose-Capillary: 137 mg/dL — ABNORMAL HIGH (ref 70–99)
Glucose-Capillary: 155 mg/dL — ABNORMAL HIGH (ref 70–99)
Glucose-Capillary: 192 mg/dL — ABNORMAL HIGH (ref 70–99)
Glucose-Capillary: 191 mg/dL — ABNORMAL HIGH (ref 70–99)

## 2022-09-05 LAB — T4: T4, Total: 9.2 ug/dL (ref 4.5–12.0)

## 2022-09-05 LAB — LIPASE, BLOOD: Lipase: 582 U/L — ABNORMAL HIGH (ref 11–51)

## 2022-09-05 MED ORDER — PANTOPRAZOLE SODIUM 40 MG IV SOLR
40.0000 mg | INTRAVENOUS | Status: DC
  Administered 2022-09-05 – 2022-09-09 (×5): 40 mg via INTRAVENOUS
  Filled 2022-09-05 (×5): qty 10

## 2022-09-05 MED ORDER — GADOBUTROL 1 MMOL/ML IV SOLN
10.0000 mL | Freq: Once | INTRAVENOUS | Status: AC | PRN
  Administered 2022-09-05: 10 mL via INTRAVENOUS

## 2022-09-05 MED ORDER — SODIUM CHLORIDE 0.9 % IV SOLN
1.0000 g | INTRAVENOUS | Status: DC
  Administered 2022-09-05 – 2022-09-06 (×2): 1 g via INTRAVENOUS
  Filled 2022-09-05 (×2): qty 10

## 2022-09-05 MED ORDER — HYDROMORPHONE HCL 1 MG/ML IJ SOLN
1.0000 mg | INTRAMUSCULAR | Status: DC | PRN
  Administered 2022-09-05 – 2022-09-09 (×5): 1 mg via INTRAVENOUS
  Filled 2022-09-05 (×6): qty 1

## 2022-09-05 NOTE — Consult Note (Signed)
[facility] Consultation Note  Referring Provider: [facility] Primary Care Physician:  [name], FNP Primary Gastroenterologist:  Gentry Fitz  Reason for Consultation:  pancreatitis  HPI: Carolyn Hooper is a 62 y.o. female undergoing treatment by Dr. Shirline Frees for metastatic lung cancer.  Presents few day history of GERD and post-prandial abdominal pain.  No prior similar difficulties.  No hematemesis or blood in stool.  Imaging with findings as below.   Past Medical History:  Diagnosis Date   Abscess    hydroadenitis superativa   Anxiety    DM (diabetes mellitus) (HCC)    Hyperinsulinemia    Hypertension 1983   gestational - pre-eclampsia   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[facility])    Neuroma    audiotry neuroma   Seizure (HCC)    Vertigo     Past Surgical History:  Procedure Laterality Date   ABDOMINAL HYSTERECTOMY  2006   remaining left ovary   BRONCHIAL BIOPSY  02/19/2022   Procedure: BRONCHIAL BIOPSIES;  Surgeon: Josephine Igo, DO;  Location: [facility];  Service: Pulmonary;;   BRONCHIAL BRUSHINGS  02/19/2022   Procedure: BRONCHIAL BRUSHINGS;  Surgeon: Josephine Igo, DO;  Location: [facility];  Service: Pulmonary;;   BRONCHIAL NEEDLE ASPIRATION BIOPSY  02/19/2022   Procedure: BRONCHIAL NEEDLE ASPIRATION BIOPSIES;  Surgeon: Josephine Igo, DO;  Location: [facility];  Service: Pulmonary;;   IR IMAGING GUIDED PORT INSERTION  03/29/2022   SHOULDER SURGERY Right 1960s    Prior to Admission medications   Medication Sig Start Date End Date Taking? Authorizing Provider  HYDROcodone-acetaminophen (NORCO) 5-325 MG tablet Take 1 - 2 tablets by mouth every 6 (six) hours as needed for moderate pain. 05/30/22  Yes Hawks, [name] A, FNP  insulin glargine (LANTUS SOLOSTAR) 100 UNIT/ML Solostar Pen Inject 12 Units into the skin daily. Patient taking differently: Inject 8-16 Units into the skin See admin instructions. Inject 8-16 units Per slidig scale in  the morning 08/15/22  Yes Hawks, [name] A, FNP  levETIRAcetam (KEPPRA) 1000 MG tablet Take 1 tablet (1,000 mg total) by mouth 2 (two) times daily. 07/31/22  Yes Vaslow, Georgeanna Lea, MD  metFORMIN (GLUCOPHAGE) 1000 MG tablet Take 1 tablet (1,000 mg total) by mouth 2 (two) times daily. 05/10/22  Yes Hawks, [name] A, FNP  MYLANTA MAXIMUM STRENGTH 400-400-40 MG/5ML suspension Take 15 mLs by mouth See admin instructions. Midday and hs   Yes [provider]  triamcinolone ointment (KENALOG) 0.5 % Apply topically to affected area twice daily as directed, 07/30/22  Yes Johnson, Clanford L, MD  TYLENOL 500 MG tablet Take 1,000 mg by mouth every 6 (six) hours as needed for mild pain or headache.   Yes [provider]  albuterol (VENTOLIN HFA) 108 (90 Base) MCG/ACT inhaler Inhale 2 puffs into the lungs every 6 (six) hours as needed. Patient not taking: Reported on 09/05/2022 01/08/21   [name], FNP  clonazePAM (KLONOPIN) 0.5 MG tablet Take 1 tablet (0.5 mg total) by mouth 2 (two) times daily. 08/30/22   [name], FNP  dexamethasone (DECADRON) 1 MG tablet Take 3 tablets by mouth daily with breakfast for 7 days, THEN take 2 tablets daily for 7 days, THEN 1 tablet daily for 7 days. 08/29/22 09/19/22  [name], MD  famotidine (PEPCID) 20 MG tablet Take 1 tablet (20 mg total) by mouth 2 (two) times daily. 08/22/22   [name], MD  fluconazole (DIFLUCAN) 150 MG tablet Take 1 tablet by  mouth every 3 days as needed (yeast infection). 08/29/22   [name], FNP  folic acid (FOLVITE) 1 MG tablet Take 1 tablet (1 mg total) by mouth daily. 05/26/22   [name], PA-C  Insulin Pen Needle (PEN NEEDLES) 32G X 4 MM MISC Use to inject insulin daily as directed. 07/09/22   [name], FNP  lidocaine (XYLOCAINE) 5 % ointment Apply topically as needed. 05/30/22   [name], FNP  lidocaine-prilocaine (EMLA) cream Apply to port prior to access 04/25/22   [name], MD   ondansetron (ZOFRAN) 4 MG tablet Take 1 tablet (4 mg total) by mouth every 6 (six) hours as needed for nausea. Patient taking differently: Take 4-8 mg by mouth every 6 (six) hours as needed for nausea. 07/17/22   [name], [name], MD  prochlorperazine (COMPAZINE) 10 MG tablet Take 1 tablet (10 mg total) by mouth every 6 (six) hours as needed. 05/30/22   Heilingoetter, [name], PA-C    Current Facility-Administered Medications  Medication Dose Route Frequency Provider Last Rate Last Admin   acetaminophen (TYLENOL) tablet 650 mg  650 mg Oral Q6H PRN Hillary Bow, DO       Or   acetaminophen (TYLENOL) suppository 650 mg  650 mg Rectal Q6H PRN Hillary Bow, DO       albuterol (PROVENTIL) (2.5 MG/3ML) 0.083% nebulizer solution 3 mL  3 mL Inhalation Q6H PRN Hillary Bow, DO       cefTRIAXone (ROCEPHIN) 1 g in sodium chloride 0.9 % 100 mL IVPB  1 g Intravenous Q24H Dahal, Melina Schools, MD 200 mL/hr at 09/05/22 0943 1 g at 09/05/22 0943   Chlorhexidine Gluconate Cloth 2 % PADS 6 each  6 each Topical Daily Hillary Bow, DO   6 each at 09/05/22 1006   clonazePAM (KLONOPIN) tablet 0.5 mg  0.5 mg Oral BID Hillary Bow, DO   0.5 mg at 09/05/22 1610   dexamethasone (DECADRON) tablet 2 mg  2 mg Oral Daily Lyda Perone M, DO   2 mg at 09/05/22 0841   enoxaparin (LOVENOX) injection 40 mg  40 mg Subcutaneous Q24H Lyda Perone M, DO   40 mg at 09/05/22 9604   folic acid (FOLVITE) tablet 1 mg  1 mg Oral Daily Lyda Perone M, DO   1 mg at 09/05/22 0840   HYDROmorphone (DILAUDID) injection 1 mg  1 mg Intravenous Q4H PRN Dahal, Melina Schools, MD       insulin aspart (novoLOG) injection 0-9 Units  0-9 Units Subcutaneous Q4H Hillary Bow, DO   2 Units at 09/05/22 1152   insulin glargine-yfgn (SEMGLEE) injection 12 Units  12 Units Subcutaneous Daily Hillary Bow, DO   12 Units at 09/05/22 5409   lactated ringers infusion   Intravenous Continuous Hillary Bow, DO 125 mL/hr at 09/05/22 0550 New  Bag at 09/05/22 0550   levETIRAcetam (KEPPRA) tablet 1,000 mg  1,000 mg Oral BID Hillary Bow, DO   1,000 mg at 09/05/22 0839   ondansetron (ZOFRAN) injection 4 mg  4 mg Intravenous Q6H PRN Hillary Bow, DO       pantoprazole (PROTONIX) injection 40 mg  40 mg Intravenous Q24H Dahal, Melina Schools, MD   40 mg at 09/05/22 1152   sodium chloride flush (NS) 0.9 % injection 10-40 mL  10-40 mL Intracatheter PRN Hillary Bow, DO        Allergies as of 09/04/2022 - Review Complete 09/04/2022  Allergen Reaction Noted   Adhesive [tape] Dermatitis 04/07/2011   Statins Other (See Comments) 01/08/2021    Family History  Problem Relation Age of Onset   COPD Mother    Heart disease Father    Diabetes Father    Kidney disease Father    Arthritis Father        RA   Liver disease Father     Social History   Socioeconomic History   Marital status: Divorced    Spouse name: Not on file   Number of children: Not on file   Years of education: Not on file   Highest education level: Not on file  Occupational History   Not on file  Tobacco Use   Smoking status: Former    Packs/day: .75    Types: Cigarettes    Quit date: 02/11/2022    Years since quitting: 0.5   Smokeless tobacco: Never  Vaping Use   Vaping Use: Never used  Substance and Sexual Activity   Alcohol use: Yes    Comment: very rare   Drug use: No   Sexual activity: Not on file  Other Topics Concern   Not on file  Social History Narrative   Not on file   Social Determinants of Health   Financial Resource Strain: Not on file  Food Insecurity: No Food Insecurity (09/05/2022)   Hunger Vital Sign    Worried About Running Out of Food in the Last Year: Never true    Ran Out of Food in the Last Year: Never true  Transportation Needs: No Transportation Needs (09/05/2022)   PRAPARE - Administrator, Civil Service (Medical): No    Lack of Transportation (Non-Medical): No  Physical Activity: Not on file  Stress:  Not on file  Social Connections: Not on file  Intimate Partner Violence: Not At Risk (09/05/2022)   Humiliation, Afraid, Rape, and Kick questionnaire    Fear of Current or Ex-Partner: No    Emotionally Abused: No    Physically Abused: No    Sexually Abused: No    Review of Systems: As per HPI, all others negative  Physical Exam: Vital signs in last 24 hours: Temp:  [97.9 F (36.6 C)-98.6 F (37 C)] 98.6 F (37 C) (06/20 1408) Pulse Rate:  [58-70] 70 (06/20 1408) Resp:  [11-18] 17 (06/20 1408) BP: (95-145)/(50-82) 128/75 (06/20 1408) SpO2:  [94 %-100 %] 94 % (06/20 1408) Last BM Date : 09/04/22 General:   Alert,  chronically ill-appearing, swollen face and extremities (steroids?), cooperative in NAD Head:  Normocephalic and atraumatic. Eyes:  Sclera clear, no icterus.   Conjunctiva pink. Ears:  Normal auditory acuity. Nose:  No deformity, discharge,  or lesions. Mouth:  No deformity or lesions.  Oropharynx pink & moist. Neck:  Supple; no masses or thyromegaly. Lungs:  No respiratory distress Abdomen:  Soft, mild distended, moderate tenderness without peritonitis, No masses, hepatosplenomegaly or hernias noted. Without guarding, and without rebound.     Msk:  Symmetrical without gross deformities. Normal posture. Pulses:  Normal pulses noted. Extremities:  Without clubbing or edema. Neurologic:  Alert and  oriented x4;  grossly normal neurologically. Skin:  Intact without significant lesions or rashes. Psych:  Alert and cooperative. Normal mood and affect.   Lab Results: Recent Labs    09/03/22 0725 09/04/22 1751 09/05/22 0339  WBC 8.6 8.0 7.7  HGB 11.6* 11.3* 10.2*  HCT 36.0 35.3* 31.8*  PLT 191 195 153   BMET Recent Labs  09/03/22 0725 09/04/22 1751 09/05/22 0339  NA 137 135 138  K 4.0 3.9 3.5  CL 100 97* 103  CO2 30 28 29   GLUCOSE 249* 356* 130*  BUN 19 21 17   CREATININE 0.61 0.54 0.57  CALCIUM 8.8* 8.6* 8.1*   LFT Recent Labs    09/05/22 0339   PROT 5.3*  ALBUMIN 2.6*  AST 25  ALT 50*  ALKPHOS 51  BILITOT 0.9   PT/INR No results for input(s): "LABPROT", "INR" in the last 72 hours.  Studies/Results: MR ABDOMEN MRCP W WO CONTAST  Result Date: 09/05/2022 CLINICAL DATA:  Pancreatitis suspected, cholelithiasis EXAM: MRI ABDOMEN WITHOUT AND WITH CONTRAST (INCLUDING MRCP) TECHNIQUE: Multiplanar multisequence MR imaging of the abdomen was performed both before and after the administration of intravenous contrast. Heavily T2-weighted images of the biliary and pancreatic ducts were obtained, and three-dimensional MRCP images were rendered by post processing. CONTRAST:  10mL GADAVIST GADOBUTROL 1 MMOL/ML IV SOLN COMPARISON:  CT abdomen pelvis, right upper quadrant ultrasound, 09/04/2022 CT abdomen pelvis, 07/27/2022 FINDINGS: Lower chest: Medial left lower lobe mass, partially imaged, with associated trace pleural effusion (series 3, image 2). Hepatobiliary: Mild, diffuse hepatic steatosis with focal fatty deposition in the central right lobe of the liver (series 8, image 39). Large gallstone near the gallbladder neck with additional gravel and sludge in the fundus. No gallbladder wall thickening. No biliary ductal dilatation. Suspect at least one tiny gallstone in the distal common bile duct near the ampulla, measuring no greater than 0.3 cm (series 3, image 26). Pancreas: Mild inflammatory fat stranding about the pancreas. Small areas of hypoenhancing fluid signal in the superior pancreatic head, neck, and proximal body, largest individual focus measuring 0.8 cm in the superior pancreatic head (series 5, image 23, 18, series 15, image 55, 43). Spleen: Normal in size without significant abnormality. Adrenals/Urinary Tract: Adrenal glands are unremarkable. Kidneys are normal, without renal calculi, solid lesion, or hydronephrosis. Stomach/Bowel: Large, loculated, rim enhancing fluid collection overlying the greater curvature of the stomach measuring  13.5 x 11.1 x 6.5 cm, new compared to prior examination dated 07/27/2022. (Series 4, image 7, series 3, image 29). Stomach otherwise within normal limits. No evidence of bowel wall thickening, distention, or inflammatory changes. Vascular/Lymphatic: No significant vascular findings are present. No enlarged abdominal lymph nodes. Other: No abdominal wall hernia or abnormality. Trace perihepatic and perisplenic ascites. Musculoskeletal: No acute or significant osseous findings. IMPRESSION: 1. Mild inflammatory fat stranding about the pancreas. Subcentimeter foci of hypoenhancing fluid signal in the superior pancreatic head, pancreatic neck, and proximal pancreatic body, most consistent with small foci of parenchymal necrosis and developing acute pancreatic fluid collections. 2. Large, loculated, rim enhancing fluid collection overlying the greater curvature of the stomach measuring 13.5 x 11.1 x 6.5 cm, new compared to prior examination dated 07/27/2022 and consistent with an acute pancreatic fluid collection / developing pseudocyst. The presence or absence of infection is not established by imaging. 3. Suspect at least one tiny gallstone in the distal common bile duct near the ampulla, measuring no greater than 0.3 cm. No biliary ductal dilatation. 4. Cholelithiasis. 5. Medial left lower lobe mass, partially imaged, with associated trace pleural effusion, in keeping with known primary lung malignancy. 6. Hepatic steatosis. These results will be called to the ordering clinician or representative by the Radiologist Assistant, and communication documented in the PACS or [employer]. Electronically Signed   By: Jearld Lesch M.D.   On: 09/05/2022 07:57   MR 3D Recon At  Scanner  Result Date: 09/05/2022 CLINICAL DATA:  Pancreatitis suspected, cholelithiasis EXAM: MRI ABDOMEN WITHOUT AND WITH CONTRAST (INCLUDING MRCP) TECHNIQUE: Multiplanar multisequence MR imaging of the abdomen was performed both before and after  the administration of intravenous contrast. Heavily T2-weighted images of the biliary and pancreatic ducts were obtained, and three-dimensional MRCP images were rendered by post processing. CONTRAST:  10mL GADAVIST GADOBUTROL 1 MMOL/ML IV SOLN COMPARISON:  CT abdomen pelvis, right upper quadrant ultrasound, 09/04/2022 CT abdomen pelvis, 07/27/2022 FINDINGS: Lower chest: Medial left lower lobe mass, partially imaged, with associated trace pleural effusion (series 3, image 2). Hepatobiliary: Mild, diffuse hepatic steatosis with focal fatty deposition in the central right lobe of the liver (series 8, image 39). Large gallstone near the gallbladder neck with additional gravel and sludge in the fundus. No gallbladder wall thickening. No biliary ductal dilatation. Suspect at least one tiny gallstone in the distal common bile duct near the ampulla, measuring no greater than 0.3 cm (series 3, image 26). Pancreas: Mild inflammatory fat stranding about the pancreas. Small areas of hypoenhancing fluid signal in the superior pancreatic head, neck, and proximal body, largest individual focus measuring 0.8 cm in the superior pancreatic head (series 5, image 23, 18, series 15, image 55, 43). Spleen: Normal in size without significant abnormality. Adrenals/Urinary Tract: Adrenal glands are unremarkable. Kidneys are normal, without renal calculi, solid lesion, or hydronephrosis. Stomach/Bowel: Large, loculated, rim enhancing fluid collection overlying the greater curvature of the stomach measuring 13.5 x 11.1 x 6.5 cm, new compared to prior examination dated 07/27/2022. (Series 4, image 7, series 3, image 29). Stomach otherwise within normal limits. No evidence of bowel wall thickening, distention, or inflammatory changes. Vascular/Lymphatic: No significant vascular findings are present. No enlarged abdominal lymph nodes. Other: No abdominal wall hernia or abnormality. Trace perihepatic and perisplenic ascites. Musculoskeletal: No  acute or significant osseous findings. IMPRESSION: 1. Mild inflammatory fat stranding about the pancreas. Subcentimeter foci of hypoenhancing fluid signal in the superior pancreatic head, pancreatic neck, and proximal pancreatic body, most consistent with small foci of parenchymal necrosis and developing acute pancreatic fluid collections. 2. Large, loculated, rim enhancing fluid collection overlying the greater curvature of the stomach measuring 13.5 x 11.1 x 6.5 cm, new compared to prior examination dated 07/27/2022 and consistent with an acute pancreatic fluid collection / developing pseudocyst. The presence or absence of infection is not established by imaging. 3. Suspect at least one tiny gallstone in the distal common bile duct near the ampulla, measuring no greater than 0.3 cm. No biliary ductal dilatation. 4. Cholelithiasis. 5. Medial left lower lobe mass, partially imaged, with associated trace pleural effusion, in keeping with known primary lung malignancy. 6. Hepatic steatosis. These results will be called to the ordering clinician or representative by the Radiologist Assistant, and communication documented in the PACS or [employer]. Electronically Signed   By: Jearld Lesch M.D.   On: 09/05/2022 07:57   CT Angio Chest PE W and/or Wo Contrast  Addendum Date: 09/04/2022   ADDENDUM REPORT: 09/04/2022 21:30 ADDENDUM: There is mild stranding of the upper mesentery and adjacent to the pancreas which may represent acute pancreatitis. Correlation with pancreatic enzymes recommended. Additionally there is a loculated appearing fluid collection measuring approximately 6 x 14 cm along the greater curvature of the stomach most consistent with a pseudocyst. Electronically Signed   By: Elgie Collard M.D.   On: 09/04/2022 21:30   Result Date: 09/04/2022 CLINICAL DATA:  Upper abdominal pain and back pain. History of lung  cancer. EXAM: CT ANGIOGRAPHY CHEST CT ABDOMEN AND PELVIS WITH CONTRAST TECHNIQUE:  Multidetector CT imaging of the chest was performed using the standard protocol during bolus administration of intravenous contrast. Multiplanar CT image reconstructions and MIPs were obtained to evaluate the vascular anatomy. Multidetector CT imaging of the abdomen and pelvis was performed using the standard protocol during bolus administration of intravenous contrast. RADIATION DOSE REDUCTION: This exam was performed according to the departmental dose-optimization program which includes automated exposure control, adjustment of the mA and/or kV according to patient size and/or use of iterative reconstruction technique. CONTRAST:  OMNIPAQUE IOHEXOL 350 MG/ML SOLN COMPARISON:  CT abdomen pelvis dated 07/27/2022. chest CT dated 07/15/2022. FINDINGS: CTA CHEST FINDINGS Cardiovascular: There is no cardiomegaly or pericardial effusion. Three-vessel coronary vascular calcification. Mild atherosclerotic calcification of the thoracic aorta. No aneurysmal dilatation or dissection. The origins of the great vessels of the aortic arch appear patent. Right-sided Port-A-Cath with tip in the right atrium close to the cavoatrial junction. No pulmonary artery embolus identified. Mediastinum/Nodes: No hilar or mediastinal adenopathy. The esophagus and the thyroid gland are grossly unremarkable. No mediastinal fluid collection. Lungs/Pleura: Grossly stable left lower lobe mass in keeping with known malignancy. Several scattered pulmonary nodules measure up to 9 mm in the right upper lobe similar to prior CT and most consistent with metastasis. No new consolidation. No pleural effusion or pneumothorax. The central airways are patent. Musculoskeletal: Osteopenia with degenerative changes spine. No acute osseous pathology. Review of the MIP images confirms the above findings. CT ABDOMEN and PELVIS FINDINGS No intra-abdominal free air.  Small ascites. Hepatobiliary: The liver is unremarkable. Focal area of hypoenhancement in the  left lobe of the liver centrally, likely fatty infiltration. There is mild biliary dilatation. There is a large rim calcified stone in the gallbladder measuring approximately 5.2 cm in diameter. No pericholecystic fluid or evidence of acute cholecystitis by CT. Pancreas: Subcentimeter hypodense lesion in the uncinate process of the pancreas appears similar to prior CT and may represent focal interspersed fat or a side branch IPMN. No active inflammatory changes. No dilatation of the main pancreatic duct or gland atrophy. Spleen: Normal in size without focal abnormality. Adrenals/Urinary Tract: The adrenal glands are unremarkable. There is no hydronephrosis on either side. There is symmetric enhancement and excretion of contrast by both kidneys. The visualized ureters appear unremarkable. The urinary bladder is collapsed. Stomach/Bowel: There is sigmoid diverticulosis and scattered colonic diverticula without active inflammatory changes. There is no bowel obstruction or active inflammation. The appendix is normal. Vascular/Lymphatic: Advanced aortoiliac atherosclerotic disease. The IVC is unremarkable. No portal venous gas. There is no adenopathy. Reproductive: Hysterectomy.  No adnexal masses. Other: None Musculoskeletal: Osteopenia with degenerative changes of the spine. No acute osseous pathology. Review of the MIP images confirms the above findings. IMPRESSION: 1. No acute intrathoracic, abdominal, or pelvic pathology. No CT evidence of pulmonary artery embolus. 2. Grossly stable left lower lobe mass in keeping with known malignancy. 3. Stable pulmonary nodules most consistent with metastasis. 4. Cholelithiasis. 5. Colonic diverticulosis. No bowel obstruction. Normal appendix. 6.  Aortic Atherosclerosis (ICD10-I70.0). Electronically Signed: By: Elgie Collard M.D. On: 09/04/2022 19:59   CT ABDOMEN PELVIS W CONTRAST  Addendum Date: 09/04/2022   ADDENDUM REPORT: 09/04/2022 21:30 ADDENDUM: There is mild  stranding of the upper mesentery and adjacent to the pancreas which may represent acute pancreatitis. Correlation with pancreatic enzymes recommended. Additionally there is a loculated appearing fluid collection measuring approximately 6 x 14 cm along the greater curvature  of the stomach most consistent with a pseudocyst. Electronically Signed   By: Elgie Collard M.D.   On: 09/04/2022 21:30   Result Date: 09/04/2022 CLINICAL DATA:  Upper abdominal pain and back pain. History of lung cancer. EXAM: CT ANGIOGRAPHY CHEST CT ABDOMEN AND PELVIS WITH CONTRAST TECHNIQUE: Multidetector CT imaging of the chest was performed using the standard protocol during bolus administration of intravenous contrast. Multiplanar CT image reconstructions and MIPs were obtained to evaluate the vascular anatomy. Multidetector CT imaging of the abdomen and pelvis was performed using the standard protocol during bolus administration of intravenous contrast. RADIATION DOSE REDUCTION: This exam was performed according to the departmental dose-optimization program which includes automated exposure control, adjustment of the mA and/or kV according to patient size and/or use of iterative reconstruction technique. CONTRAST:  OMNIPAQUE IOHEXOL 350 MG/ML SOLN COMPARISON:  CT abdomen pelvis dated 07/27/2022. chest CT dated 07/15/2022. FINDINGS: CTA CHEST FINDINGS Cardiovascular: There is no cardiomegaly or pericardial effusion. Three-vessel coronary vascular calcification. Mild atherosclerotic calcification of the thoracic aorta. No aneurysmal dilatation or dissection. The origins of the great vessels of the aortic arch appear patent. Right-sided Port-A-Cath with tip in the right atrium close to the cavoatrial junction. No pulmonary artery embolus identified. Mediastinum/Nodes: No hilar or mediastinal adenopathy. The esophagus and the thyroid gland are grossly unremarkable. No mediastinal fluid collection. Lungs/Pleura: Grossly stable left  lower lobe mass in keeping with known malignancy. Several scattered pulmonary nodules measure up to 9 mm in the right upper lobe similar to prior CT and most consistent with metastasis. No new consolidation. No pleural effusion or pneumothorax. The central airways are patent. Musculoskeletal: Osteopenia with degenerative changes spine. No acute osseous pathology. Review of the MIP images confirms the above findings. CT ABDOMEN and PELVIS FINDINGS No intra-abdominal free air.  Small ascites. Hepatobiliary: The liver is unremarkable. Focal area of hypoenhancement in the left lobe of the liver centrally, likely fatty infiltration. There is mild biliary dilatation. There is a large rim calcified stone in the gallbladder measuring approximately 5.2 cm in diameter. No pericholecystic fluid or evidence of acute cholecystitis by CT. Pancreas: Subcentimeter hypodense lesion in the uncinate process of the pancreas appears similar to prior CT and may represent focal interspersed fat or a side branch IPMN. No active inflammatory changes. No dilatation of the main pancreatic duct or gland atrophy. Spleen: Normal in size without focal abnormality. Adrenals/Urinary Tract: The adrenal glands are unremarkable. There is no hydronephrosis on either side. There is symmetric enhancement and excretion of contrast by both kidneys. The visualized ureters appear unremarkable. The urinary bladder is collapsed. Stomach/Bowel: There is sigmoid diverticulosis and scattered colonic diverticula without active inflammatory changes. There is no bowel obstruction or active inflammation. The appendix is normal. Vascular/Lymphatic: Advanced aortoiliac atherosclerotic disease. The IVC is unremarkable. No portal venous gas. There is no adenopathy. Reproductive: Hysterectomy.  No adnexal masses. Other: None Musculoskeletal: Osteopenia with degenerative changes of the spine. No acute osseous pathology. Review of the MIP images confirms the above findings.  IMPRESSION: 1. No acute intrathoracic, abdominal, or pelvic pathology. No CT evidence of pulmonary artery embolus. 2. Grossly stable left lower lobe mass in keeping with known malignancy. 3. Stable pulmonary nodules most consistent with metastasis. 4. Cholelithiasis. 5. Colonic diverticulosis. No bowel obstruction. Normal appendix. 6.  Aortic Atherosclerosis (ICD10-I70.0). Electronically Signed: By: Elgie Collard M.D. On: 09/04/2022 19:59   US Abdomen Limited RUQ (LIVER/GB)  Result Date: 09/04/2022 CLINICAL DATA:  Pancreatitis. EXAM: ULTRASOUND ABDOMEN LIMITED RIGHT UPPER  QUADRANT COMPARISON:  CT abdomen pelvis dated 09/04/2022. FINDINGS: Gallbladder: There is a large stone in the gallbladder, better seen on the CT. No gallbladder wall thickening or pericholecystic fluid. Negative sonographic Murphy's sign. Common bile duct: Diameter: 5 mm Liver: There is diffuse increased liver echogenicity most commonly seen in the setting of fatty infiltration. Superimposed inflammation or fibrosis is not excluded. Clinical correlation is recommended. Portal vein is patent on color Doppler imaging with normal direction of blood flow towards the liver. Other: Small ascites. IMPRESSION: 1. Cholelithiasis without sonographic evidence of acute cholecystitis. 2. Fatty liver. 3. Small ascites. Electronically Signed   By: Elgie Collard M.D.   On: 09/04/2022 21:25    Impression:   Metastatic lung cancer, on treatment. Pancreatitis, severe, complicated by necrosis and large peripancreatic fluid collection versus incipient pseudocyst. Gallstones, suspected cause of #1 above. Suspected CBD stone. Modestly elevated LFTs.  Plan:   Supportive care pancreatitis;  IVF, judicious analgesics, clear liquids only. Patient in no shape for ERCP or endoscopic intervention at this time. Pseudocyst may in future cause gastric outlet obstruction symptoms; we will follow for this; in this case, may have to consider endoscopic  cystgastrostomy in future pending clinical course. Watch electrolytes and renal function carefully. Difficult case. [facility] will follow.   LOS: 0 days   Harlei Lehrmann M  09/05/2022, 3:58 PM  Cell [phone] If no answer or after 5 PM call [phone]

## 2022-09-05 NOTE — Plan of Care (Signed)
  Problem: Coping: Goal: Ability to adjust to condition or change in health will improve 09/05/2022 1144 by Penelope Galas, RN Outcome: Adequate for Discharge 09/05/2022 0751 by Penelope Galas, RN Outcome: Progressing   Problem: Nutritional: Goal: Maintenance of adequate nutrition will improve 09/05/2022 1144 by Penelope Galas, RN Outcome: Adequate for Discharge 09/05/2022 0751 by Penelope Galas, RN Outcome: Progressing Goal: Progress toward achieving an optimal weight will improve 09/05/2022 1144 by Penelope Galas, RN Outcome: Adequate for Discharge 09/05/2022 0751 by Penelope Galas, RN Outcome: Progressing   Problem: Activity: Goal: Risk for activity intolerance will decrease 09/05/2022 1144 by Penelope Galas, RN Outcome: Adequate for Discharge 09/05/2022 0751 by Penelope Galas, RN Outcome: Progressing   Problem: Pain Managment: Goal: General experience of comfort will improve 09/05/2022 1144 by Penelope Galas, RN Outcome: Adequate for Discharge 09/05/2022 1610 by Penelope Galas, RN Outcome: Progressing   Problem: Safety: Goal: Ability to remain free from injury will improve 09/05/2022 1144 by Penelope Galas, RN Outcome: Adequate for Discharge 09/05/2022 0751 by Penelope Galas, RN Outcome: Progressing

## 2022-09-05 NOTE — Progress Notes (Signed)
PROGRESS NOTE  Carolyn Hooper  DOB: 05-12-1960  PCP: [name], FNP WUJ:[id number]  DOA: 09/04/2022  LOS: 0 days  Hospital Day: 2  Brief narrative: Carolyn Hooper is a 62 y.o. 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  Patient follows up with oncology Dr. Arbutus Ped and currently on immunotherapy / chemotherapy.  Also on Decadron taper for brain mets. 6/19, patient presented to the ED with complaint of abdominal pain for 4 days.  Last chemotherapy was the day before and 6/18. Complains of waxing and waning pain in the upper back between the shoulder blades.  Also complains of abdominal bloating, distention.  Has history of cholelithiasis. Having bowel movements and passing gas.  In the ED, patient was afebrile, heart rate in 60s and 70s, blood pressure 143/88, breathing on room air Labs with WBC count 8, hemoglobin 11.3, glucose 356, lipase level elevated to 1200. Urinalysis showed hazy yellow urine with small amount of leukocytes, positive nitrite, many bacteria  CT angio chest did not show any pulm embolism.  It showed known left lower lobe mass which was grossly stable.  It showed stable pulmonary nodules consistent with metastasis CT abdomen and pelvis showed mild stranding of the upper mesentery and adjacent to the pancreatic suggestive of acute pancreatitis.  There is also a loculated appearing fluid collection 6 x 14 cm along the greater curvature of the stomach most consistent with pseudocyst.  It also showed cholelithiasis and colic diverticulosis without evidence of cholecystitis or bowel obstruction.  Admitted to [facility] Right upper quadrant ultrasound showed cholelithiasis without evidence of cholecystitis, fatty liver MRCP showed pancreatic lesion as well as a small foci of parenchymal necrosis and developing fluid collection.  Also confirmed a large loculated pancreatic pseudocyst seen on CT scan. It also showed at least 1 tiny  gallstone in the distal CBD near the ampulla of 0.3 cm size, no CBD dilation though.  Subjective: Patient was seen and examined this morning.  Pleasant middle-aged Caucasian female.  Lying on bed.  Comes in intermittent severe pain.  Currently little somnolent from the effect of pain medicine.  Family not at bedside. Chart reviewed. Afebrile, hemodynamically stable, breathing on room air Last set of blood work from this morning showed WBC count of 7.7, hemoglobin 10.2, lipase level pending  Assessment and plan: Acute gallstone pancreatitis with foci of necrosis  Large pancreatic pseudocyst Presented with abdominal pain, known history of gallstone Suspected gallstone pancreatitis CT abdomen, RUQ [place] and MRCP findings as above.  No CBD dilation but has a small distal CBD stone.  No acute cholecystitis. Pain progressively with large pancreatic pseudocyst Patient is on Keytruda which also has a potential side effect of pancreatitis in less than 1% of cases per up-to-date Right upper quadrant ultrasound denies any evidence of acute cholecystitis, CBD 5 mm. Currently on conservative management with IV fluid, IV antiemetic, IV analgesics, n.p.o. status GI consultation called to Dr. Dulce Sellar. Because of frequent severe pain, I started on as needed IV Dilaudid  UTI Urinalysis showed hazy yellow urine with small amount of leukocytes, positive nitrite, many bacteria Urine culture sent IV Rocephin given in the ED.  I will continue the same for now Recent Labs  Lab 09/03/22 0725 09/04/22 1751 09/05/22 0339  WBC 8.6 8.0 7.7   NSCLC with brain metastasis Patient follows up with oncology Dr. Arbutus Ped  Currently on immunotherapy / chemotherapy.   Has history of SRS for brain mets. Cont keppra,  cont decadron taper  Type 2 diabetes mellitus A1c 8.4 on 09/04/2022 PTA on Lantus 12 units daily Currently n.p.o. and is on SSI/Accu-Cheks. Recent Labs  Lab 09/04/22 2348 09/05/22 0406 09/05/22 0739  09/05/22 1132  GLUCAP 152* 118* 137* 155*   Mobility: Encourage ambulation.  Previously independent.  Prolonged course, may need PT eval  Goals of care   Code Status: Full Code     DVT prophylaxis:  enoxaparin (LOVENOX) injection 40 mg Start: 09/05/22 1000   Antimicrobials: IV Rocephin Fluid: Currently on LR at 125 mill per hour Consultants: GI Family Communication: Not at bedside  Status: Observation Level of care:  Med-Surg   Patient from: Home Anticipated d/c to: Pending clinical course Needs to continue in-hospital care:  Pancreatitis       Diet:  Diet Order             Diet NPO time specified Except for: Ice Chips, Sips with Meds  Diet effective midnight                   Scheduled Meds:  Chlorhexidine Gluconate Cloth  6 each Topical Daily   clonazePAM  0.5 mg Oral BID   dexamethasone  2 mg Oral Daily   enoxaparin (LOVENOX) injection  40 mg Subcutaneous Q24H   folic acid  1 mg Oral Daily   insulin aspart  0-9 Units Subcutaneous Q4H   insulin glargine-yfgn  12 Units Subcutaneous Daily   levETIRAcetam  1,000 mg Oral BID   pantoprazole (PROTONIX) IV  40 mg Intravenous Q24H    PRN meds: acetaminophen **OR** acetaminophen, albuterol, HYDROmorphone (DILAUDID) injection, ondansetron (ZOFRAN) IV, sodium chloride flush   Infusions:   cefTRIAXone (ROCEPHIN)  IV 1 g (09/05/22 0943)   lactated ringers 125 mL/hr at 09/05/22 0550    Antimicrobials: Anti-infectives (From admission, onward)    Start     Dose/Rate Route Frequency Ordered Stop   09/05/22 1000  cefTRIAXone (ROCEPHIN) 1 g in sodium chloride 0.9 % 100 mL IVPB        1 g 200 mL/hr over 30 Minutes Intravenous Every 24 hours 09/05/22 0908     09/04/22 1945  cefTRIAXone (ROCEPHIN) 1 g in sodium chloride 0.9 % 100 mL IVPB        1 g 200 mL/hr over 30 Minutes Intravenous  Once 09/04/22 1944 09/04/22 2115       Nutritional status:  Body mass index is 34.53 kg/m.           Objective: Vitals:   09/04/22 2255 09/05/22 0238  BP: (!) 131/50 131/64  Pulse: 62 66  Resp: 18 16  Temp: 98.3 F (36.8 C) 97.9 F (36.6 C)  SpO2: 99% 97%    Intake/Output Summary (Last 24 hours) at 09/05/2022 1328 Last data filed at 09/05/2022 0535 Gross per 24 hour  Intake 1484.82 ml  Output --  Net 1484.82 ml   Filed Weights   09/04/22 1547  Weight: 103 kg   Weight change:  Body mass index is 34.53 kg/m.   Physical Exam: General exam: Pleasant, middle-aged Skin: No rashes, lesions or ulcers. HEENT: Atraumatic, normocephalic, no obvious bleeding Lungs: Clear to auscultation bilaterally CVS: Regular rate and rhythm, no murmur GI/Abd soft, mild to moderate epigastric tenderness, bowel sound present CNS: Somnolent, opens eyes on verbal command.  Slow to respond oriented x 3.  Able to have conversation Psychiatry: Sad affect Extremities: No pedal edema, no calf tenderness  Data Review: I have personally reviewed the laboratory  data and studies available.  F/u labs ordered Unresulted Labs (From admission, onward)     Start     Ordered   09/06/22 0500  CBC with Differential/Platelet  Daily,   R     Question:  Specimen collection method  Answer:  IV Team=IV Team collect   09/05/22 0847   09/06/22 0500  Comprehensive metabolic panel  Daily,   R     Question:  Specimen collection method  Answer:  IV Team=IV Team collect   09/05/22 0847   09/06/22 0500  Lipase, blood  Daily,   R     Question:  Specimen collection method  Answer:  IV Team=IV Team collect   09/05/22 0847   09/04/22 2049  Urine Culture (for pregnant, neutropenic or urologic patients or patients with an indwelling urinary catheter)  (Urine Labs)  Once,   R       Question:  Indication  Answer:  Suprapubic pain   09/04/22 2048            Total time spent in review of labs and imaging, patient evaluation, formulation of plan, documentation and communication with family: 55  minutes  Signed, Lorin Glass, MD [facility] 09/05/2022

## 2022-09-05 NOTE — Plan of Care (Signed)
  Problem: Activity: Goal: Risk for activity intolerance will decrease Outcome: Progressing   Problem: Pain Managment: Goal: General experience of comfort will improve Outcome: Progressing   Problem: Safety: Goal: Ability to remain free from injury will improve Outcome: Progressing   

## 2022-09-06 DIAGNOSIS — K8591 Acute pancreatitis with uninfected necrosis, unspecified: Secondary | ICD-10-CM

## 2022-09-06 DIAGNOSIS — C3490 Malignant neoplasm of unspecified part of unspecified bronchus or lung: Secondary | ICD-10-CM | POA: Diagnosis not present

## 2022-09-06 DIAGNOSIS — E1165 Type 2 diabetes mellitus with hyperglycemia: Secondary | ICD-10-CM | POA: Diagnosis not present

## 2022-09-06 DIAGNOSIS — C7931 Secondary malignant neoplasm of brain: Secondary | ICD-10-CM | POA: Diagnosis not present

## 2022-09-06 LAB — COMPREHENSIVE METABOLIC PANEL
ALT: 63 U/L — ABNORMAL HIGH (ref 0–44)
AST: 35 U/L (ref 15–41)
Albumin: 2.5 g/dL — ABNORMAL LOW (ref 3.5–5.0)
Alkaline Phosphatase: 59 U/L (ref 38–126)
Anion gap: 7 (ref 5–15)
BUN: 11 mg/dL (ref 8–23)
CO2: 28 mmol/L (ref 22–32)
Calcium: 8.3 mg/dL — ABNORMAL LOW (ref 8.9–10.3)
Chloride: 103 mmol/L (ref 98–111)
Creatinine, Ser: 0.5 mg/dL (ref 0.44–1.00)
GFR, Estimated: >60 mL/min (ref >60)
Glucose, Bld: 132 mg/dL — ABNORMAL HIGH (ref 70–99)
Potassium: 3.9 mmol/L (ref 3.5–5.1)
Sodium: 138 mmol/L (ref 135–145)
Total Bilirubin: 0.7 mg/dL (ref 0.3–1.2)
Total Protein: 5.6 g/dL — ABNORMAL LOW (ref 6.5–8.1)

## 2022-09-06 LAB — CBC WITH DIFFERENTIAL/PLATELET
Abs Immature Granulocytes: 0.04 10*3/uL (ref 0.00–0.07)
Basophils Absolute: 0 10*3/uL (ref 0.0–0.1)
Basophils Relative: 0 %
Eosinophils Absolute: 0.1 10*3/uL (ref 0.0–0.5)
Eosinophils Relative: 1 %
HCT: 32.4 % — ABNORMAL LOW (ref 36.0–46.0)
Hemoglobin: 10.2 g/dL — ABNORMAL LOW (ref 12.0–15.0)
Immature Granulocytes: 1 %
Lymphocytes Relative: 15 %
Lymphs Abs: 1.2 10*3/uL (ref 0.7–4.0)
MCH: 31.7 pg (ref 26.0–34.0)
MCHC: 31.5 g/dL (ref 30.0–36.0)
MCV: 100.6 fL — ABNORMAL HIGH (ref 80.0–100.0)
Monocytes Absolute: 0.1 10*3/uL (ref 0.1–1.0)
Monocytes Relative: 1 %
Neutro Abs: 6.7 10*3/uL (ref 1.7–7.7)
Neutrophils Relative %: 82 %
Platelets: 170 10*3/uL (ref 150–400)
RBC: 3.22 MIL/uL — ABNORMAL LOW (ref 3.87–5.11)
RDW: 16.6 % — ABNORMAL HIGH (ref 11.5–15.5)
WBC: 8.1 10*3/uL (ref 4.0–10.5)
nRBC: 0 % (ref 0.0–0.2)

## 2022-09-06 LAB — GLUCOSE, CAPILLARY
Glucose-Capillary: 114 mg/dL — ABNORMAL HIGH (ref 70–99)
Glucose-Capillary: 130 mg/dL — ABNORMAL HIGH (ref 70–99)
Glucose-Capillary: 168 mg/dL — ABNORMAL HIGH (ref 70–99)
Glucose-Capillary: 186 mg/dL — ABNORMAL HIGH (ref 70–99)
Glucose-Capillary: 204 mg/dL — ABNORMAL HIGH (ref 70–99)
Glucose-Capillary: 208 mg/dL — ABNORMAL HIGH (ref 70–99)
Glucose-Capillary: 252 mg/dL — ABNORMAL HIGH (ref 70–99)

## 2022-09-06 LAB — LIPASE, BLOOD: Lipase: 174 U/L — ABNORMAL HIGH (ref 11–51)

## 2022-09-06 LAB — URINE CULTURE: Culture: >=100000 — AB

## 2022-09-06 MED ORDER — DEXAMETHASONE 4 MG PO TABS
2.0000 mg | ORAL_TABLET | Freq: Every day | ORAL | Status: DC
  Administered 2022-09-07 – 2022-09-08 (×2): 2 mg via ORAL
  Filled 2022-09-06 (×2): qty 1

## 2022-09-06 MED ORDER — DEXAMETHASONE 2 MG PO TABS: 3.0000 mg | ORAL_TABLET | Freq: Every day | ORAL | Status: DC

## 2022-09-06 MED ORDER — FLUCONAZOLE 150 MG PO TABS
150.0000 mg | ORAL_TABLET | ORAL | Status: AC
  Administered 2022-09-06 – 2022-09-09 (×2): 150 mg via ORAL
  Filled 2022-09-06 (×2): qty 1

## 2022-09-06 MED ORDER — DEXAMETHASONE 0.5 MG PO TABS
1.0000 mg | ORAL_TABLET | Freq: Once | ORAL | Status: AC
  Administered 2022-09-06: 1 mg via ORAL
  Filled 2022-09-06: qty 2

## 2022-09-06 NOTE — Hospital Course (Addendum)
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 Shirline Frees and Dr. Barbaraann Cao, presented with abdominal pain.  Admitted for acute pancreatitis with fluid collection consistent with pseudocyst and small foci of pancreatic necrosis.  Consultants GI  Procedures None

## 2022-09-06 NOTE — Progress Notes (Signed)
  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 Shirline Frees and Dr. Barbaraann Cao, presented with abdominal pain.  Admitted for acute pancreatitis with fluid collection consistent with pseudocyst and small foci of pancreatic necrosis.  Consultants GI  Procedures None  Assessment and Plan: * Acute pancreatitis complicated by necrosis and large peripancreatic fluid collection versus developing pseudocyst. Modest LFT elevation. Differential would include chemotherapy adverse drug reaction.  Notified oncology. Continue supportive care.  Continue clear liquids only.  Close follow-up with GI for monitoring of pseudocyst.  Pyuria Asymptomatic.  No treatment indicated.  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 [facility]) Seizure disorder secondary to above Cont Keppra Cont Decadron  Non-small cell lung cancer [facility]) Primary oncologist notified of admission  Uncontrolled type 2 diabetes mellitus with hyperglycemia, without long-term current use of insulin (HCC) CBG stable.   Cont home lantus 12u daily. Sensitive SSI Q4H     Subjective:  Feels ok  Physical Exam: Vitals:   09/05/22 1408 09/05/22 2058 09/06/22 0508 09/06/22 1333  BP: 128/75 (!) 127/49 (!) 121/58 (!) 143/91  Pulse: 70 68 77 65  Resp: 17 18 20 18   Temp: 98.6 F (37 C) 98.4 F (36.9 C) 98.2 F (36.8 C) 98.5 F (36.9 C)  TempSrc: Oral Oral Oral Oral  SpO2: 94% 95% 97% 100%  Weight:      Height:       Physical Exam Vitals reviewed.  Constitutional:      General: She is not in acute distress.    Appearance: She is not ill-appearing or toxic-appearing.  Cardiovascular:     Rate and Rhythm: Normal rate and regular rhythm.     Heart sounds: No murmur  heard. Pulmonary:     Effort: Pulmonary effort is normal. No respiratory distress.     Breath sounds: No wheezing, rhonchi or rales.  Abdominal:     General: There is no distension.     Palpations: Abdomen is soft.     Tenderness: There is abdominal tenderness (Minimal).  Neurological:     Mental Status: She is alert.  Psychiatric:        Mood and Affect: Mood normal.        Behavior: Behavior normal.     Data Reviewed: Lipase 582 > 174 Hgb stable 10.2  Family Communication: none present or requested  Disposition: Status is: Inpatient Remains inpatient appropriate because: acute pancreatitis  Planned Discharge Destination: Home    Time spent: 25 minutes  Author: Brendia Sacks, MD 09/06/2022 6:09 PM  For on call review [email or web address].

## 2022-09-06 NOTE — Progress Notes (Signed)
Initial Nutrition Assessment  DOCUMENTATION CODES:   Obesity unspecified  INTERVENTION:  - Clear Liquid diet per Surgery.  - Once diet advanced, recommend Ensure Max po BID, each supplement provides 150 kcal and 30 grams of protein.  - Encourage intake as tolerated.    NUTRITION DIAGNOSIS:   Increased nutrient needs related to chronic illness as evidenced by estimated needs.  GOAL:   Patient will meet greater than or equal to 90% of their needs  MONITOR:   PO intake, Supplement acceptance, Diet advancement, Weight trends  REASON FOR ASSESSMENT:   Malnutrition Screening Tool    ASSESSMENT:   62 y.o. female with PMH significant for DM2, NSCLC with brain mets (currently on immunotherapy / chemotherapy and Decadron taper for brain mets) who presented with complaint of abdominal pain for 4 days. Admitted for acute pancreatitis.  Patient sitting in bed, awaiting RN to help her use the bathroom. Agreeable to answer questions while waiting.   Endorses a UBW of 275# she last weight in November 2023 and steady weight loss since that time. Per EMR, patient has had a 15# or 6% weight loss in the past month, which is significant for the time frame.  She reports eating around 3 meals a day at home. Usually has a Chartered certified accountant OR eggs with cheese and spinach for breakfast, chicken nuggets from a drive through for lunch (due to usually having mid day appointments), and a meat with a vegetable for dinner. Appetite good when she is on steroids but "meh" when off them.   Current appetite is good and patient reports tolerating clear liquid diet well. Would like to advance diet mostly because she is worried all the liquids will cause her to have diarrhea.  Discussed diet progression will be determined by tolerance as well as clearance from MD. She is fearful to try something like Boost Breeze but agreeable to Ensure Max once diet advanced.    Medications reviewed and include: Decadron, 1mg   folic acid  Labs reviewed:  HA1C 8.4 Blood Glucose 114-192 x24 hours   NUTRITION - FOCUSED PHYSICAL EXAM:  Unable to complete, patient going to bathroom at end of visit.   Diet Order:   Diet Order             Diet clear liquid Room service appropriate? Yes; Fluid consistency: Thin  Diet effective now                   EDUCATION NEEDS:  Education needs have been addressed  Skin:  Skin Assessment: Reviewed RN Assessment  Last BM:  6/20  Height:  Ht Readings from Last 1 Encounters:  09/04/22 5\' 8"  (1.727 m)   Weight:  Wt Readings from Last 1 Encounters:  09/04/22 103 kg    BMI:  Body mass index is 34.53 kg/m.  Estimated Nutritional Needs:  Kcal:  1950-2150 kcals Protein:  100-125 grams Fluid:  >/= 1.9L    Shelle Iron RD, LDN For contact information, refer to [facility].

## 2022-09-06 NOTE — Progress Notes (Signed)
Patient getting bath when I went to see her.  [facility] will revisit tomorrow.

## 2022-09-07 DIAGNOSIS — C3490 Malignant neoplasm of unspecified part of unspecified bronchus or lung: Secondary | ICD-10-CM | POA: Diagnosis not present

## 2022-09-07 DIAGNOSIS — E1165 Type 2 diabetes mellitus with hyperglycemia: Secondary | ICD-10-CM | POA: Diagnosis not present

## 2022-09-07 DIAGNOSIS — K8591 Acute pancreatitis with uninfected necrosis, unspecified: Secondary | ICD-10-CM | POA: Diagnosis not present

## 2022-09-07 DIAGNOSIS — C7931 Secondary malignant neoplasm of brain: Secondary | ICD-10-CM | POA: Diagnosis not present

## 2022-09-07 LAB — LIPASE, BLOOD: Lipase: 140 U/L — ABNORMAL HIGH (ref 11–51)

## 2022-09-07 LAB — COMPREHENSIVE METABOLIC PANEL
ALT: 65 U/L — ABNORMAL HIGH (ref 0–44)
AST: 30 U/L (ref 15–41)
Albumin: 2.4 g/dL — ABNORMAL LOW (ref 3.5–5.0)
Alkaline Phosphatase: 63 U/L (ref 38–126)
Anion gap: 7 (ref 5–15)
BUN: 8 mg/dL (ref 8–23)
CO2: 28 mmol/L (ref 22–32)
Calcium: 8.6 mg/dL — ABNORMAL LOW (ref 8.9–10.3)
Chloride: 103 mmol/L (ref 98–111)
Creatinine, Ser: 0.46 mg/dL (ref 0.44–1.00)
GFR, Estimated: >60 mL/min (ref >60)
Glucose, Bld: 175 mg/dL — ABNORMAL HIGH (ref 70–99)
Potassium: 4.2 mmol/L (ref 3.5–5.1)
Sodium: 138 mmol/L (ref 135–145)
Total Bilirubin: 0.9 mg/dL (ref 0.3–1.2)
Total Protein: 5.4 g/dL — ABNORMAL LOW (ref 6.5–8.1)

## 2022-09-07 LAB — CBC WITH DIFFERENTIAL/PLATELET
Abs Immature Granulocytes: 0.03 10*3/uL (ref 0.00–0.07)
Basophils Absolute: 0 10*3/uL (ref 0.0–0.1)
Basophils Relative: 0 %
Eosinophils Absolute: 0 10*3/uL (ref 0.0–0.5)
Eosinophils Relative: 0 %
HCT: 30.6 % — ABNORMAL LOW (ref 36.0–46.0)
Hemoglobin: 9.7 g/dL — ABNORMAL LOW (ref 12.0–15.0)
Immature Granulocytes: 1 %
Lymphocytes Relative: 11 %
Lymphs Abs: 0.6 10*3/uL — ABNORMAL LOW (ref 0.7–4.0)
MCH: 31.5 pg (ref 26.0–34.0)
MCHC: 31.7 g/dL (ref 30.0–36.0)
MCV: 99.4 fL (ref 80.0–100.0)
Monocytes Absolute: 0 10*3/uL — ABNORMAL LOW (ref 0.1–1.0)
Monocytes Relative: 1 %
Neutro Abs: 4.9 10*3/uL (ref 1.7–7.7)
Neutrophils Relative %: 87 %
Platelets: 164 10*3/uL (ref 150–400)
RBC: 3.08 MIL/uL — ABNORMAL LOW (ref 3.87–5.11)
RDW: 16.4 % — ABNORMAL HIGH (ref 11.5–15.5)
WBC: 5.7 10*3/uL (ref 4.0–10.5)
nRBC: 0 % (ref 0.0–0.2)

## 2022-09-07 LAB — GLUCOSE, CAPILLARY
Glucose-Capillary: 134 mg/dL — ABNORMAL HIGH (ref 70–99)
Glucose-Capillary: 143 mg/dL — ABNORMAL HIGH (ref 70–99)
Glucose-Capillary: 161 mg/dL — ABNORMAL HIGH (ref 70–99)
Glucose-Capillary: 239 mg/dL — ABNORMAL HIGH (ref 70–99)
Glucose-Capillary: 255 mg/dL — ABNORMAL HIGH (ref 70–99)
Glucose-Capillary: 363 mg/dL — ABNORMAL HIGH (ref 70–99)

## 2022-09-07 NOTE — Progress Notes (Signed)
  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 Shirline Frees and Dr. Barbaraann Cao, presented with abdominal pain.  Admitted for acute pancreatitis with fluid collection consistent with pseudocyst and small foci of pancreatic necrosis.  Consultants GI  Procedures None  Assessment and Plan: * Acute pancreatitis complicated by necrosis and large peripancreatic fluid collection versus developing pseudocyst. Modest LFT elevation. Discussed with Dr. Arbutus Ped, could be related to immunotherapy which will be discontinued as an outpatient.  He recommended prednisone 1 mg/kg daily with slow taper as an outpatient.  Patient is already on Decadron. Continue supportive care.  Continue clear liquids only.  Close follow-up with GI for monitoring of pseudocyst.   Pyuria Asymptomatic.  No treatment indicated.   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   Cont home lantus 12u daily. Sensitive SSI Q4H      Subjective:  Feels ok Tolerating liquids No pain  Physical Exam: Vitals:   09/06/22 1333 09/06/22 2252 09/07/22 0659 09/07/22 1344  BP: (!) 143/91 127/69 139/72 (!) 130/58  Pulse: 65 (!) 58 (!) 57 (!) 58  Resp: 18 16 18 18   Temp: 98.5 F (36.9 C) 98.3 F (36.8 C) 97.9 F (36.6 C) 97.8 F (36.6 C)  TempSrc: Oral  Oral Oral  SpO2: 100% 95% 98% 93%  Weight:      Height:       Physical Exam Vitals reviewed.  Constitutional:      General: She is not in acute  distress.    Appearance: She is not ill-appearing or toxic-appearing.  Cardiovascular:     Rate and Rhythm: Normal rate and regular rhythm.     Heart sounds: No murmur heard. Pulmonary:     Effort: Pulmonary effort is normal. No respiratory distress.     Breath sounds: No wheezing, rhonchi or rales.  Abdominal:     General: There is no distension.     Palpations: Abdomen is soft.     Tenderness: There is no abdominal tenderness.  Neurological:     Mental Status: She is alert.  Psychiatric:        Mood and Affect: Mood normal.        Behavior: Behavior normal.     Data Reviewed: CBG stable Lipase down tto 140 ALT up to 65 Hgb stable at 9.7  Family Communication: none  Disposition: Status is: Inpatient Remains inpatient appropriate because: acute pancreatitis  Planned Discharge Destination: Home    Time spent: 25 minutes  Author: Brendia Sacks, MD 09/07/2022 3:59 PM  For on call review [email or web address].

## 2022-09-07 NOTE — Progress Notes (Signed)
   09/07/22 1445  TOC Brief Assessment  Insurance and Status Reviewed  Patient has primary care physician Yes  Home environment has been reviewed home with friend  Prior level of function: modifiend independent  Prior/Current Home Services No current home services  Social Determinants of Health Reivew SDOH reviewed no interventions necessary  Readmission risk has been reviewed Yes  Transition of care needs no transition of care needs at this time

## 2022-09-07 NOTE — Progress Notes (Signed)
[facility] Gastroenterology Progress Note  MADGELINE RAYO 62 y.o. June 28, 1960   Subjective: Abdominal pain tolerable. Tolerating clear liquid diet.   Objective: Vital signs: Vitals:   09/06/22 2252 09/07/22 0659  BP: 127/69 139/72  Pulse: (!) 58 (!) 57  Resp: 16 18  Temp: 98.3 F (36.8 C) 97.9 F (36.6 C)  SpO2: 95% 98%    Physical Exam: Gen: alert, no acute distress, obese, pleasant HEENT: anicteric sclera CV: RRR Chest: CTA B Abd: epigastric tenderness with guarding, and RUQ tenderness without guarding, soft, nondistended, +BS Ext: no edema  Lab Results: Recent Labs    09/06/22 0340 09/07/22 0304  NA 138 138  K 3.9 4.2  CL 103 103  CO2 28 28  GLUCOSE 132* 175*  BUN 11 8  CREATININE 0.50 0.46  CALCIUM 8.3* 8.6*   Recent Labs    09/06/22 0340 09/07/22 0304  AST 35 30  ALT 63* 65*  ALKPHOS 59 63  BILITOT 0.7 0.9  PROT 5.6* 5.4*  ALBUMIN 2.5* 2.4*   Recent Labs    09/06/22 0340 09/07/22 0304  WBC 8.1 5.7  NEUTROABS 6.7 4.9  HGB 10.2* 9.7*  HCT 32.4* 30.6*  MCV 100.6* 99.4  PLT 170 164      Assessment/Plan: Complicated pancreatitis with necrosis and large pseudocyst adjacent to greater curvature of gastric wall in the setting of metastatic lung cancer with brain mets. Continue on clear liquid diet only. If abdominal pain continues to improve, then would repeat CT in 1-2 weeks to evaluate for evolution of pseudocyst and necrosis. Follow electrolytes closely. Supportive care. Will follow.   [name] 09/07/2022, 10:23 AM  Questions please call [phone] ID: Inda Merlin, female   DOB: 1960/11/05, 62 y.o.   MRN: [id number]

## 2022-09-07 NOTE — Plan of Care (Signed)
  Problem: Education: Goal: Knowledge of General Education information will improve Description Including pain rating scale, medication(s)/side effects and non-pharmacologic comfort measures Outcome: Progressing   

## 2022-09-07 NOTE — Plan of Care (Signed)

## 2022-09-08 DIAGNOSIS — C7931 Secondary malignant neoplasm of brain: Secondary | ICD-10-CM | POA: Diagnosis not present

## 2022-09-08 DIAGNOSIS — E1165 Type 2 diabetes mellitus with hyperglycemia: Secondary | ICD-10-CM | POA: Diagnosis not present

## 2022-09-08 DIAGNOSIS — C3490 Malignant neoplasm of unspecified part of unspecified bronchus or lung: Secondary | ICD-10-CM | POA: Diagnosis not present

## 2022-09-08 DIAGNOSIS — K8591 Acute pancreatitis with uninfected necrosis, unspecified: Secondary | ICD-10-CM | POA: Diagnosis not present

## 2022-09-08 LAB — COMPREHENSIVE METABOLIC PANEL
ALT: 108 U/L — ABNORMAL HIGH (ref 0–44)
AST: 79 U/L — ABNORMAL HIGH (ref 15–41)
Albumin: 2.4 g/dL — ABNORMAL LOW (ref 3.5–5.0)
Alkaline Phosphatase: 53 U/L (ref 38–126)
Anion gap: 7 (ref 5–15)
BUN: 7 mg/dL — ABNORMAL LOW (ref 8–23)
CO2: 28 mmol/L (ref 22–32)
Calcium: 8.5 mg/dL — ABNORMAL LOW (ref 8.9–10.3)
Chloride: 104 mmol/L (ref 98–111)
Creatinine, Ser: 0.47 mg/dL (ref 0.44–1.00)
GFR, Estimated: >60 mL/min (ref >60)
Glucose, Bld: 147 mg/dL — ABNORMAL HIGH (ref 70–99)
Potassium: 3.8 mmol/L (ref 3.5–5.1)
Sodium: 139 mmol/L (ref 135–145)
Total Bilirubin: 0.9 mg/dL (ref 0.3–1.2)
Total Protein: 5.3 g/dL — ABNORMAL LOW (ref 6.5–8.1)

## 2022-09-08 LAB — CBC WITH DIFFERENTIAL/PLATELET
Abs Immature Granulocytes: 0.03 10*3/uL (ref 0.00–0.07)
Basophils Absolute: 0 10*3/uL (ref 0.0–0.1)
Basophils Relative: 0 %
Eosinophils Absolute: 0 10*3/uL (ref 0.0–0.5)
Eosinophils Relative: 0 %
HCT: 29.8 % — ABNORMAL LOW (ref 36.0–46.0)
Hemoglobin: 9.6 g/dL — ABNORMAL LOW (ref 12.0–15.0)
Immature Granulocytes: 1 %
Lymphocytes Relative: 21 %
Lymphs Abs: 1 10*3/uL (ref 0.7–4.0)
MCH: 31.8 pg (ref 26.0–34.0)
MCHC: 32.2 g/dL (ref 30.0–36.0)
MCV: 98.7 fL (ref 80.0–100.0)
Monocytes Absolute: 0 10*3/uL — ABNORMAL LOW (ref 0.1–1.0)
Monocytes Relative: 1 %
Neutro Abs: 3.8 10*3/uL (ref 1.7–7.7)
Neutrophils Relative %: 77 %
Platelets: 156 10*3/uL (ref 150–400)
RBC: 3.02 MIL/uL — ABNORMAL LOW (ref 3.87–5.11)
RDW: 15.9 % — ABNORMAL HIGH (ref 11.5–15.5)
WBC: 4.9 10*3/uL (ref 4.0–10.5)
nRBC: 0 % (ref 0.0–0.2)

## 2022-09-08 LAB — GLUCOSE, CAPILLARY
Glucose-Capillary: 109 mg/dL — ABNORMAL HIGH (ref 70–99)
Glucose-Capillary: 152 mg/dL — ABNORMAL HIGH (ref 70–99)
Glucose-Capillary: 170 mg/dL — ABNORMAL HIGH (ref 70–99)
Glucose-Capillary: 239 mg/dL — ABNORMAL HIGH (ref 70–99)
Glucose-Capillary: 258 mg/dL — ABNORMAL HIGH (ref 70–99)
Glucose-Capillary: 269 mg/dL — ABNORMAL HIGH (ref 70–99)

## 2022-09-08 LAB — LIPASE, BLOOD: Lipase: 167 U/L — ABNORMAL HIGH (ref 11–51)

## 2022-09-08 MED ORDER — DEXAMETHASONE 4 MG PO TABS
4.0000 mg | ORAL_TABLET | Freq: Every day | ORAL | Status: DC
  Administered 2022-09-09: 4 mg via ORAL
  Filled 2022-09-08: qty 1

## 2022-09-08 MED ORDER — SENNA 8.6 MG PO TABS
1.0000 | ORAL_TABLET | Freq: Every day | ORAL | Status: DC
  Administered 2022-09-09 – 2022-09-11 (×3): 8.6 mg via ORAL
  Filled 2022-09-08 (×3): qty 1

## 2022-09-08 MED ORDER — DEXAMETHASONE 4 MG PO TABS
2.0000 mg | ORAL_TABLET | Freq: Once | ORAL | Status: AC
  Administered 2022-09-08: 2 mg via ORAL
  Filled 2022-09-08: qty 1

## 2022-09-08 MED ORDER — POLYETHYLENE GLYCOL 3350 17 G PO PACK
17.0000 g | PACK | Freq: Two times a day (BID) | ORAL | Status: DC
  Administered 2022-09-08: 17 g via ORAL
  Filled 2022-09-08 (×2): qty 1

## 2022-09-08 MED ORDER — BISACODYL 10 MG RE SUPP: 10.0000 mg | Freq: Every day | RECTAL | Status: DC | PRN

## 2022-09-08 NOTE — Progress Notes (Signed)
[facility] Gastroenterology Progress Note  Carolyn Hooper 62 y.o. 1960/09/19   Subjective: Feels better. Denies abdominal pain. Denies N/V. Tolerating clear liquid diet.  Objective: Vital signs: Vitals:   09/08/22 0619 09/08/22 1343  BP: (!) 141/81 (!) 117/58  Pulse: 61 (!) 56  Resp: 17 18  Temp: 98.2 F (36.8 C) 97.6 F (36.4 C)  SpO2: 99% 100%    Physical Exam: Gen: lethargic, no acute distress, obese  HEENT: anicteric sclera CV: RRR Chest: CTA B Abd: minimal epigastric tenderness with guarding, otherwise nontender, soft, nondistended, +BS Ext: no edema  Lab Results: Recent Labs    09/07/22 0304 09/08/22 0253  NA 138 139  K 4.2 3.8  CL 103 104  CO2 28 28  GLUCOSE 175* 147*  BUN 8 7*  CREATININE 0.46 0.47  CALCIUM 8.6* 8.5*   Recent Labs    09/07/22 0304 09/08/22 0253  AST 30 79*  ALT 65* 108*  ALKPHOS 63 53  BILITOT 0.9 0.9  PROT 5.4* 5.3*  ALBUMIN 2.4* 2.4*   Recent Labs    09/07/22 0304 09/08/22 0253  WBC 5.7 4.9  NEUTROABS 4.9 3.8  HGB 9.7* 9.6*  HCT 30.6* 29.8*  MCV 99.4 98.7  PLT 164 156      Assessment/Plan: Complicated pancreatitis with necrosis and large pseudocyst adjacent to greater curvature of gastric wall in the setting of 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 Clinically improving. Changed diet to full liquid diet. Slowly advance tomorrow to low fat diet if tolerates full liquid diet. Dr. Ewing Hooper will f/u tomorrow.   Carolyn Hooper 09/08/2022, 2:17 PM  Questions please call [phone] ID: Carolyn Hooper, female   DOB: 12-21-1960, 62 y.o.   MRN: [id number]

## 2022-09-08 NOTE — Progress Notes (Signed)
  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 Shirline Frees and Dr. Barbaraann Cao, presented with abdominal pain.  Admitted for acute pancreatitis with fluid collection consistent with pseudocyst and small foci of pancreatic necrosis.  Consultants GI  Procedures None  Assessment and Plan: * Acute pancreatitis complicated by necrosis and large peripancreatic fluid collection versus developing pseudocyst. Modest LFT elevation. Discussed with Dr. Arbutus Ped, could be related to immunotherapy which will be discontinued as an outpatient.  He recommended prednisone 1 mg/kg daily with slow taper as an outpatient.  Patient is already on Decadron. Continue supportive care.  Clinically improved.  Per GI advance to full liquid diet.  Consider low-fat diet tomorrow.  Close follow-up with GI for monitoring of pseudocyst.   Pyuria Asymptomatic.  No treatment indicated.   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   Non-small cell lung cancer [facility]) Primary oncologist notified of admission   Uncontrolled type 2 diabetes mellitus with hyperglycemia, without long-term current use of insulin (HCC) CBG labile.   Will continue home lantus 12u daily. Sensitive SSI Q4H      Subjective:  Feels better Excited to advance diet  Physical Exam: Vitals:   09/07/22 0659 09/07/22 1344 09/07/22 2214 09/08/22 0619  BP: 139/72 (!) 130/58 (!) 149/75 (!) 141/81  Pulse: (!) 57 (!) 58 (!) 52 61  Resp: 18 18 18 17   Temp: 97.9 F (36.6 C) 97.8 F (36.6 C) 98.3 F (36.8 C) 98.2 F (36.8 C)  TempSrc: Oral Oral Oral Oral  SpO2: 98% 93% 98% 99%   Weight:      Height:       Physical Exam Vitals reviewed.  Constitutional:      General: She is not in acute distress.    Appearance: She is not ill-appearing or toxic-appearing.  Cardiovascular:     Rate and Rhythm: Normal rate and regular rhythm.     Heart sounds: No murmur heard. Pulmonary:     Effort: Pulmonary effort is normal. No respiratory distress.     Breath sounds: No wheezing, rhonchi or rales.  Abdominal:     Palpations: Abdomen is soft.  Neurological:     Mental Status: She is alert.  Psychiatric:        Mood and Affect: Mood normal.        Behavior: Behavior normal.     Data Reviewed: CBG stable Lipase 174, 140, 167 AST 30 > 79 ALT 65 > 108 Hgb stable 9.6 WBC WNL  Family Communication: none  Disposition: Status is: Inpatient Remains inpatient appropriate because: acute pancreatitis  Planned Discharge Destination: Home    Time spent: 20 minutes  Author: Brendia Sacks, MD 09/08/2022 10:22 AM  For on call review [email or web address].

## 2022-09-08 NOTE — Plan of Care (Signed)

## 2022-09-08 NOTE — Plan of Care (Signed)

## 2022-09-09 ENCOUNTER — Inpatient Hospital Stay: Payer: BC Managed Care – PPO

## 2022-09-09 ENCOUNTER — Ambulatory Visit: Payer: BC Managed Care – PPO | Admitting: Internal Medicine

## 2022-09-09 ENCOUNTER — Telehealth: Payer: Self-pay | Admitting: *Deleted

## 2022-09-09 DIAGNOSIS — C7931 Secondary malignant neoplasm of brain: Secondary | ICD-10-CM | POA: Diagnosis not present

## 2022-09-09 DIAGNOSIS — C3490 Malignant neoplasm of unspecified part of unspecified bronchus or lung: Secondary | ICD-10-CM | POA: Diagnosis not present

## 2022-09-09 DIAGNOSIS — K8591 Acute pancreatitis with uninfected necrosis, unspecified: Secondary | ICD-10-CM | POA: Diagnosis not present

## 2022-09-09 DIAGNOSIS — E1165 Type 2 diabetes mellitus with hyperglycemia: Secondary | ICD-10-CM | POA: Diagnosis not present

## 2022-09-09 LAB — COMPREHENSIVE METABOLIC PANEL
ALT: 166 U/L — ABNORMAL HIGH (ref 0–44)
AST: 101 U/L — ABNORMAL HIGH (ref 15–41)
Albumin: 2.3 g/dL — ABNORMAL LOW (ref 3.5–5.0)
Alkaline Phosphatase: 55 U/L (ref 38–126)
Anion gap: 9 (ref 5–15)
BUN: 9 mg/dL (ref 8–23)
CO2: 28 mmol/L (ref 22–32)
Calcium: 8.4 mg/dL — ABNORMAL LOW (ref 8.9–10.3)
Chloride: 102 mmol/L (ref 98–111)
Creatinine, Ser: 0.62 mg/dL (ref 0.44–1.00)
GFR, Estimated: >60 mL/min (ref >60)
Glucose, Bld: 223 mg/dL — ABNORMAL HIGH (ref 70–99)
Potassium: 3.9 mmol/L (ref 3.5–5.1)
Sodium: 139 mmol/L (ref 135–145)
Total Bilirubin: 0.7 mg/dL (ref 0.3–1.2)
Total Protein: 5.2 g/dL — ABNORMAL LOW (ref 6.5–8.1)

## 2022-09-09 LAB — GLUCOSE, CAPILLARY
Glucose-Capillary: 188 mg/dL — ABNORMAL HIGH (ref 70–99)
Glucose-Capillary: 148 mg/dL — ABNORMAL HIGH (ref 70–99)
Glucose-Capillary: 121 mg/dL — ABNORMAL HIGH (ref 70–99)
Glucose-Capillary: 280 mg/dL — ABNORMAL HIGH (ref 70–99)
Glucose-Capillary: 307 mg/dL — ABNORMAL HIGH (ref 70–99)

## 2022-09-09 LAB — LIPASE, BLOOD: Lipase: 182 U/L — ABNORMAL HIGH (ref 11–51)

## 2022-09-09 MED ORDER — PREDNISONE 20 MG PO TABS
100.0000 mg | ORAL_TABLET | Freq: Every day | ORAL | Status: DC
  Administered 2022-09-10 – 2022-09-12 (×3): 100 mg via ORAL
  Filled 2022-09-09 (×4): qty 5

## 2022-09-09 MED ORDER — PREDNISONE 20 MG PO TABS
20.0000 mg | ORAL_TABLET | Freq: Every day | ORAL | Status: DC
Start: 2022-09-10 — End: 2022-09-09

## 2022-09-09 MED ORDER — PREDNISONE 20 MG PO TABS: 60.0000 mg | ORAL_TABLET | Freq: Every day | ORAL | Status: DC

## 2022-09-09 MED ORDER — POLYETHYLENE GLYCOL 3350 17 G PO PACK: 17.0000 g | PACK | Freq: Every day | ORAL | Status: DC | PRN

## 2022-09-09 MED ORDER — PREDNISONE 20 MG PO TABS: 80.0000 mg | ORAL_TABLET | Freq: Every day | ORAL | Status: DC

## 2022-09-09 MED ORDER — PREDNISONE 5 MG PO TABS: 10.0000 mg | ORAL_TABLET | Freq: Every day | ORAL | Status: DC

## 2022-09-09 MED ORDER — PREDNISONE 20 MG PO TABS: 40.0000 mg | ORAL_TABLET | Freq: Every day | ORAL | Status: DC

## 2022-09-09 MED ORDER — ENSURE MAX PROTEIN PO LIQD
11.0000 [oz_av] | Freq: Two times a day (BID) | ORAL | Status: DC
  Administered 2022-09-09 – 2022-09-11 (×4): 11 [oz_av] via ORAL
  Filled 2022-09-09 (×8): qty 330

## 2022-09-09 MED ORDER — PREDNISONE 20 MG PO TABS: 20.0000 mg | ORAL_TABLET | Freq: Every day | ORAL | Status: DC

## 2022-09-09 NOTE — Progress Notes (Signed)
Inda Merlin 2:52 PM  Subjective: Patient doing better than she was in her hospital computer chart reviewed and her case discussed with partner Dr. Bosie Clos and she is tolerating a little bit of liquids and her pain is not as bad as yesterday and she has no new complaints and she was told in November she had gallstones but was also told she was not a candidate for cholecystectomy but no one has discussed with her the CBD stone seen on MRI which we did today  Objective: Vital signs stable afebrile no acute distress abdomen is soft nontender labs CTs and MRI reviewed BUN and creatinine okay minimally elevated ALT and AST and lipase with normal bili and alk phos white count okay  Assessment: Pancreatitis questionably gallstone versus medicine induced  Plan: Tomorrow if doing okay may try to advance diet again and would want to get oncology's opinion if it is worth proceeding with a ERCP at some point or waiting for a second attack of pancreatitis and if signs of gastric outlet obstruction occur from her cyst she might need an EUS and possible drainage either by Dr. Meridee Score here or at a [facility] and will check on tomorrow  [facility]  office [phone] After 5PM or if no answer call [phone]

## 2022-09-09 NOTE — Telephone Encounter (Signed)
Patient called office. Per patient - Hospitalized at [facility] for pancreatitis.  She states a gastroenterologist came to see her today and discussed a procedure that wasn't surgery to remove her gallstones. Carolyn Hooper stated the gastroenterologist told her that they would talk with or had talked with Dr. Arbutus Ped about this. Advised her that Dr. Ewing Schlein had noted in her chart that he had spoken with her today and that GI would discuss plan and any procedure with oncology.   She stated that since Dr. Arbutus Ped is in charge of cancer and cancer care she needs to discuss any plans with him and requested he call her.   Informed her that this message will be sent to Dr. Arbutus Ped.  Message routed to Dr. Arbutus Ped

## 2022-09-09 NOTE — Progress Notes (Signed)
  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 Shirline Frees and Dr. Barbaraann Cao, presented with abdominal pain.  Admitted for acute pancreatitis with fluid collection consistent with pseudocyst and small foci of pancreatic necrosis.  Consultants GI  Procedures None  Assessment and Plan: * Acute pancreatitis complicated by necrosis and large peripancreatic fluid collection versus developing pseudocyst. Modest LFT elevation Discussed with Dr. Arbutus Ped, could be related to immunotherapy which will be discontinued as an outpatient.  He recommended prednisone 1 mg/kg daily with slow taper as an outpatient.  Patient was on Decadron. Lipase slightly higher; AST and ALT higher. Will not advance diet. Monitor. Check labs in AM. Close follow-up with GI for monitoring of pseudocyst.   Pyuria Asymptomatic.  No treatment indicated.   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   Metastatic non-small cell lung cancer [facility]) Primary oncologist notified of admission. Message also sent to Dr. Barbaraann Cao   Uncontrolled type 2 diabetes mellitus with hyperglycemia, without long-term current use of insulin (HCC) CBG labile.   Will continue home lantus 12u daily. Sensitive SSI Q4H      Subjective:  Had some pain last night with eating Feels ok now  Physical Exam: Vitals:   09/08/22 1343 09/08/22 2214 09/09/22 0648 09/09/22 1310  BP: (!) 117/58 (!) 130/57 (!) 131/51 120/69  Pulse: (!) 56 64 (!) 58 69  Resp: 18 17 18 14   Temp: 97.6 F (36.4 C) 98.6 F (37 C) 98.6 F (37 C) 98 F (36.7 C)  TempSrc: Oral Oral Oral Oral  SpO2: 100% 97% 97% 100%  Weight:       Height:       Physical Exam Vitals reviewed.  Constitutional:      General: She is not in acute distress.    Appearance: She is not ill-appearing or toxic-appearing.  Cardiovascular:     Rate and Rhythm: Normal rate and regular rhythm.     Heart sounds: No murmur heard. Pulmonary:     Effort: Pulmonary effort is normal. No respiratory distress.     Breath sounds: No wheezing, rhonchi or rales.  Abdominal:     General: There is no distension.     Palpations: Abdomen is soft.  Neurological:     Mental Status: She is alert.  Psychiatric:        Mood and Affect: Mood normal.        Behavior: Behavior normal.     Data Reviewed: CBG stable Lipase 182, slightly higher AST up to 101 ALT up to 166  Family Communication: none  Disposition: Status is: Inpatient Remains inpatient appropriate because: acute pancreatitis  Planned Discharge Destination: Home    Time spent: 35 minutes  Author: Brendia Sacks, MD 09/09/2022 2:18 PM  For on call review [email or web address].

## 2022-09-10 ENCOUNTER — Other Ambulatory Visit: Payer: Self-pay | Admitting: Medical Oncology

## 2022-09-10 ENCOUNTER — Ambulatory Visit: Payer: BC Managed Care – PPO | Admitting: Physical Therapy

## 2022-09-10 DIAGNOSIS — E669 Obesity, unspecified: Secondary | ICD-10-CM | POA: Insufficient documentation

## 2022-09-10 DIAGNOSIS — K8591 Acute pancreatitis with uninfected necrosis, unspecified: Secondary | ICD-10-CM | POA: Diagnosis not present

## 2022-09-10 DIAGNOSIS — C3490 Malignant neoplasm of unspecified part of unspecified bronchus or lung: Secondary | ICD-10-CM | POA: Diagnosis not present

## 2022-09-10 DIAGNOSIS — E1165 Type 2 diabetes mellitus with hyperglycemia: Secondary | ICD-10-CM | POA: Diagnosis not present

## 2022-09-10 LAB — LIPASE, BLOOD: Lipase: 385 U/L — ABNORMAL HIGH (ref 11–51)

## 2022-09-10 LAB — COMPREHENSIVE METABOLIC PANEL
ALT: 207 U/L — ABNORMAL HIGH (ref 0–44)
AST: 111 U/L — ABNORMAL HIGH (ref 15–41)
Albumin: 2.6 g/dL — ABNORMAL LOW (ref 3.5–5.0)
Alkaline Phosphatase: 61 U/L (ref 38–126)
Anion gap: 10 (ref 5–15)
BUN: 13 mg/dL (ref 8–23)
CO2: 29 mmol/L (ref 22–32)
Calcium: 8.9 mg/dL (ref 8.9–10.3)
Chloride: 103 mmol/L (ref 98–111)
Creatinine, Ser: 0.58 mg/dL (ref 0.44–1.00)
GFR, Estimated: >60 mL/min (ref >60)
Glucose, Bld: 154 mg/dL — ABNORMAL HIGH (ref 70–99)
Potassium: 3.9 mmol/L (ref 3.5–5.1)
Sodium: 142 mmol/L (ref 135–145)
Total Bilirubin: 0.8 mg/dL (ref 0.3–1.2)
Total Protein: 5.6 g/dL — ABNORMAL LOW (ref 6.5–8.1)

## 2022-09-10 LAB — GLUCOSE, CAPILLARY
Glucose-Capillary: 115 mg/dL — ABNORMAL HIGH (ref 70–99)
Glucose-Capillary: 122 mg/dL — ABNORMAL HIGH (ref 70–99)
Glucose-Capillary: 137 mg/dL — ABNORMAL HIGH (ref 70–99)
Glucose-Capillary: 223 mg/dL — ABNORMAL HIGH (ref 70–99)
Glucose-Capillary: 236 mg/dL — ABNORMAL HIGH (ref 70–99)
Glucose-Capillary: 327 mg/dL — ABNORMAL HIGH (ref 70–99)

## 2022-09-10 MED ORDER — INSULIN ASPART 100 UNIT/ML IJ SOLN
0.0000 [IU] | Freq: Three times a day (TID) | INTRAMUSCULAR | Status: DC
  Administered 2022-09-10 – 2022-09-11 (×2): 3 [IU] via SUBCUTANEOUS
  Administered 2022-09-11: 2 [IU] via SUBCUTANEOUS
  Administered 2022-09-11 – 2022-09-12 (×2): 5 [IU] via SUBCUTANEOUS
  Administered 2022-09-12: 1 [IU] via SUBCUTANEOUS
  Administered 2022-09-12: 2 [IU] via SUBCUTANEOUS

## 2022-09-10 MED ORDER — PANCRELIPASE (LIP-PROT-AMYL) 12000-38000 UNITS PO CPEP
36000.0000 [IU] | ORAL_CAPSULE | Freq: Three times a day (TID) | ORAL | Status: DC
  Administered 2022-09-10 – 2022-09-12 (×7): 36000 [IU] via ORAL
  Filled 2022-09-10 (×7): qty 3

## 2022-09-10 MED ORDER — PANTOPRAZOLE SODIUM 40 MG PO TBEC
40.0000 mg | DELAYED_RELEASE_TABLET | Freq: Every day | ORAL | Status: DC
  Administered 2022-09-10 – 2022-09-12 (×3): 40 mg via ORAL
  Filled 2022-09-10 (×3): qty 1

## 2022-09-10 MED ORDER — INSULIN ASPART 100 UNIT/ML IJ SOLN
0.0000 [IU] | Freq: Every day | INTRAMUSCULAR | Status: DC
  Administered 2022-09-10 – 2022-09-11 (×2): 4 [IU] via SUBCUTANEOUS

## 2022-09-10 NOTE — Progress Notes (Signed)
Carolyn Hooper 12:55 PM  Subjective: The patient is doing better and we answered all of her questions and she is tolerating some liquids and has no new complaints  Objective: Vital signs stable afebrile no acute distress abdomen is soft nontender lipase increased a little transaminases up a little  Assessment: Gallstone pancreatitis versus immunotherapy side effect  Plan: Okay with me to slowly advance her diet and consider adding pancreatic enzymes for 3 to 5 days to see if they are worth taking at home and I will be on standby to assist if oncology feels ERCP sphincterotomy possible CBD stone extraction is needed or if signs of gastric outlet obstruction will need EUS by either Dr. Meridee Score or [facility] a [facility] if that continues to be a problem  [facility] E  office [phone] After 5PM or if no answer call [phone]

## 2022-09-10 NOTE — Progress Notes (Signed)

## 2022-09-10 NOTE — Progress Notes (Signed)
  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 Shirline Frees and Dr. Barbaraann Cao, presented with abdominal pain.  Admitted for acute pancreatitis with fluid collection consistent with pseudocyst and small foci of pancreatic necrosis.  Condition very slow to improve.  Discussed with Dr. Shirline Frees and started on high-dose steroids.  GI following.  Advance diet.  Hopefully home in the next 48 hours.  Consultants GI  Procedures None  Assessment and Plan: * Acute pancreatitis complicated by necrosis and large peripancreatic fluid collection versus developing pseudocyst. Modest LFT elevation Discussed with Dr. Arbutus Ped, could be related to immunotherapy which will be discontinued as an outpatient.  He recommended prednisone 1 mg/kg daily with slow taper as an outpatient.  Patient was on Decadron for metastatic disease to the brain. DDx include gallstone-induced. Lipase slightly higher; AST and ALT higher. Thought secondary to immunotherapy. Per GI ok to advance diet today. Monitor labs; if condition worsens or labs increase, GI might consider ERCP. Close follow-up with GI for monitoring of pseudocyst. If signs of gastric outlet obstruction will need EUS for drainage. Started on Creon today.   Pyuria Asymptomatic.  No treatment indicated.   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   Metastatic non-small cell lung cancer [facility]) Primary oncologist notified of admission. Message also sent to Dr. Barbaraann Cao   Uncontrolled type 2 diabetes mellitus with hyperglycemia, without long-term current use of insulin (HCC) CBG labile.   Will continue home lantus  12u daily, SSI.     Subjective:  Feels better, no pain, tolerating diet, would like to have solid food.  Physical Exam: Vitals:   09/09/22 0648 09/09/22 1310 09/09/22 1952 09/10/22 0432  BP: (!) 131/51 120/69 132/77 (!) 135/37  Pulse: (!) 58 69 71 (!) 57  Resp: 18 14 18 18   Temp: 98.6 F (37 C) 98 F (36.7 C) 98.2 F (36.8 C) 98.2 F (36.8 C)  TempSrc: Oral Oral  Oral  SpO2: 97% 100% 98% 96%  Weight:      Height:       Physical Exam Vitals reviewed.  Constitutional:      General: She is not in acute distress.    Appearance: She is not ill-appearing or toxic-appearing.  Cardiovascular:     Rate and Rhythm: Normal rate and regular rhythm.     Heart sounds: No murmur heard. Pulmonary:     Effort: Pulmonary effort is normal. No respiratory distress.     Breath sounds: No wheezing, rhonchi or rales.  Abdominal:     Palpations: Abdomen is soft.  Neurological:     Mental Status: She is alert.  Psychiatric:        Mood and Affect: Mood normal.        Behavior: Behavior normal.     Data Reviewed: CBG labile Lipase up to 385 AST up to 111 ALT up to 207  Family Communication: none  Disposition: Status is: Inpatient Remains inpatient appropriate because: acute pancreatitis with necrosis  Planned Discharge Destination: Home    Time spent: 20 minutes  Author: Brendia Sacks, MD 09/10/2022 1:52 PM  For on call review [email or web address].

## 2022-09-10 NOTE — Progress Notes (Signed)
Nutrition Follow-up  DOCUMENTATION CODES:   Obesity unspecified  INTERVENTION:  - Advance to Heart Healthy (soft, low fat) diet today per Surgery.  - Ensure Max po BID, each supplement provides 150 kcal and 30 grams of protein.  - Encourage intake as tolerated.  - Diet education with handout provided for a low fat diet for pancreatitis. Also added to AVS. - Monitor weight trends.    NUTRITION DIAGNOSIS:   Increased nutrient needs related to chronic illness as evidenced by estimated needs. *ongoing  GOAL:   Patient will meet greater than or equal to 90% of their needs *progressing  MONITOR:   PO intake, Supplement acceptance, Diet advancement, Weight trends  REASON FOR ASSESSMENT:   Consult Diet education (low fat)  ASSESSMENT:   62 y.o. female with PMH significant for DM2, NSCLC with brain mets (currently on immunotherapy / chemotherapy and Decadron taper for brain mets) who presented with complaint of abdominal pain for 4 days. Admitted for acute pancreatitis.  Patient reports that after being on clear liquids for four days, she was advanced to full liquids on 6/23. States she had 3 bowls of grits (each with 1 butter), chocolate pudding, potato soup, and tomato soup. Feels she overdid it as this gave her a lot of pain. She is now fearful to eat too much but also wishes she had more choices as she worries about meeting protein goals with cancer on such restrictive diet.   Discussed that this meal was fatty and this could be the source of her pain. Provided patient with "Pancreatitis Nutrition Therapy" diet education handout and discussed low fat food options. Patient receptive to information. She has been drinking Ensure Max and like sit.   She is eager to have her diet advanced to have more protein options and other food choices. She notes she is to be restarted on prednisone and that this always increases her appetite.   Encouraged patient to slowly add new foods to her  diet and see how she tolerates. MD and GI okay with diet advancement to low fat so diet orders changed. Patient plans to utilize diet education to order meal and have RN assist her so she doesn't over order. Also plans to try small and frequent meals for better tolerance.  Patient had several other questions regarding blood sugars, bowel movements, and supplements. Addressed all questions.  Medications reviewed and include: Folic acid, Senokot  Labs reviewed:  HA1C 8.4 Blood Glucose 121-307 x24 hours   Diet Order:   Diet Order             Diet Heart Room service appropriate? Yes; Fluid consistency: Thin  Diet effective now                   EDUCATION NEEDS:  Education needs have been addressed  Skin:  Skin Assessment: Reviewed RN Assessment  Last BM:  6/24  Height:  Ht Readings from Last 1 Encounters:  09/04/22 5\' 8"  (1.727 m)   Weight: Wt Readings from Last 1 Encounters:  09/04/22 103 kg    BMI:  Body mass index is 34.53 kg/m.  Estimated Nutritional Needs:  Kcal:  1950-2150 kcals Protein:  100-125 grams Fluid:  >/= 1.9L    Shelle Iron RD, LDN For contact information, refer to [facility].

## 2022-09-10 NOTE — Discharge Instructions (Signed)
Pancreatitis Nutrition Therapy  The pancreas helps your body digest and absorb nutrients in food. Pancreatitis prevents the body from digesting food well, especially if the food is high in fat.  This nutrition therapy limits the fat in your diet while providing nutrients you need. Your goal is to eat as near to a normal diet as possible without experiencing gastrointestinal (GI) symptoms. These symptoms include stomach pain, bloating, weight loss or difficulty maintaining weight, vomiting, burping, loose stools, and steatorrhea. The effects of pancreatitis are different for all individuals, so it is important to work with your registered dietitian nutritionist (RDN) to determine which foods trigger your GI symptoms. Your RDN can also help you figure out your tolerance to fat in foods and how to manage your symptoms with your diet.  Tips You may need to take pancreatic enzymes if you have frequent, loose stools after mealtimes. Individuals who take pancreatic enzymes will need to take the prescribed dosage at the start of a meal or snack. Individuals who are prescribed a low-fat diet will need to limit fats and oils to no more than 6 teaspoons (30 grams) daily. Up to 1 ounce of avocado can also be substituted for 1 teaspoon of fat. A low-fat diet may not be needed if you are taking pancreatic enzymes. Avoid drinking alcohol. Keep a bottle of water with you at all times to stay hydrated and ensure you are getting in enough fluid each day. The general recommendation is to drink 8 cups (64 ounces) of fluids daily. Eat small, frequent meals (4-6) throughout the day to help you recover from pancreatitis or to maintain your normal body weight. Eat more whole fruits and vegetables rather than drinking fruit and vegetable juices.  Fiber is found in whole grain foods and slows digestion. You may need to choose whole grain foods less often if you feel full quickly after eating. Ask your RDN for recommendations on  managing your diet if you also have other conditions, such as diabetes mellitus. Your RDN might recommend a vitamin and mineral supplement or fat-soluble vitamin supplements if you require higher amounts of these nutrients.  Foods Recommended and Foods Not Recommended The following list of foods may be helpful if you need to limit your fat intake. Food Group Foods Recommended Foods Not Recommended  Grains Breads: Bagels, buns, English muffins Hot/cold cereals Couscous Low-fat crackers Pancakes Pasta Popcorn, air popped Rice Corn or flour tortilla Products made with added fat (such as biscuits, waffles, and regular crackers) High-fat bakery products such as doughnuts, biscuits, croissants, Danish pastries, pies, cookies Snacks made with partially hydrogenated oils including chips, cheese puffs, snack mixes, regular crackers, butter-flavored popcorn  Protein Foods Lean cuts of poultry (without skin) such as chicken or turkey Low-fat hamburger (for example, 7% fat) Lean cuts of fish (white fish) Canned tuna in water Egg whites or egg substitute Lean deli meats such as turkey, chicken, lean beef Non-animal protein sources (tofu, legumes, beans, lentils) Smooth nut butters     Higher-fat cuts of meats such as ribs, T-bone steak, regular hamburger (15% to 20% fat) Full-fat processed meats (hot dogs, bologna, salami, sausage, bacon, etc) Red meats Organ meats (liver, brains, sweetbreads) Poultry with skin Fried meat, poultry, tofu, and fish Whole eggs and egg yolks Full-fat refried beans Tree nuts and peanuts  Dairy and Dairy Alternatives 1% or fat-free dairy (milk, yogurt, cheese, cottage cheese, sour cream) Frozen yogurt Fortified non-dairy milk (almond, rice, soy, etc.) Creamy/cheesy sauces Cream Whole-fat or reduced-fat (2%) dairy (  milk, yogurt, ice cream, cheese) Milkshakes Half-and-half Cream cheese Sour cream Coconut milk  Vegetables All fresh, frozen, or canned  vegetables Fried or stir-fried vegetables Vegetables prepared with butter, cheese, or cream sauce  Fruit All fresh, frozen, or canned fruit Fried fruits Fruit served with butter or cream  Fats and Oils All vegetable oils   Butter, stick margarine, shortening, partially hydrogenated oils, tropical oils (coconut, palm, and palm kernel oils)   Pancreatitis Sample 1-Day Menu View Nutrient Info Breakfast 2 egg whites, cooked 1 whole wheat bagel 1 tablespoon low-fat cream cheese 1 cup fat-free milk  cup blueberries  Lunch 2 slices bread 2 ounces [place] breast 2 leaves lettuce 2 slices tomato 1 teaspoon mustard 2 teaspoons nonfat mayonnaise 1 cup carrots  cup pineapple 1 cup fat-free milk  Evening Meal 3 ounces tilapia, baked  cup cooked rice  cup zucchini 1 cup lettuce for salad 2 teaspoons fat-free [place] dressing, for salad 1 dinner roll 1 teaspoon margarine  Evening Snack  cup low-fat yogurt 1 cup strawberries 1 ounce pretzels  Daily Sum Nutrient Unit Value  Macronutrients  Energy kcal 1560  Energy kJ 6541  Protein g 103  Total lipid (fat) g 23  Carbohydrate, by difference g 241  Fiber, total dietary g 23  Sugars, total g 104  Minerals  Calcium, Ca mg 1159  Iron, Fe mg 12  Sodium, Na mg 2034  Vitamins  Vitamin C, total ascorbic acid mg 158  Vitamin A, IU IU 28445  Vitamin D IU 457  Lipids  Fatty acids, total saturated g 6  Fatty acids, total monounsaturated g 6  Fatty acids, total polyunsaturated g 8  Cholesterol mg 123       Additional Discharge Instructions   Please get your medications reviewed and adjusted by your Primary MD.  Please request your Primary MD to go over all Hospital Tests and Procedure/Radiological results at the follow up, please get all Hospital records sent to your Prim MD by signing hospital release before you go home.  If you had Pneumonia of Lung problems at the Hospital: Please get a 2 view Chest X ray done in  approximately 4 weeks after hospital discharge or sooner if instructed by your Primary MD.  If you have Congestive Heart Failure: Please call your Cardiologist or Primary MD anytime you have any of the following symptoms:  1) 3 pound weight gain in 24 hours or 5 pounds in 1 week  2) shortness of breath, with or without a dry hacking cough  3) swelling in the hands, feet or stomach  4) if you have to sleep on extra pillows at night in order to breathe  Follow cardiac low salt diet and 1.5 lit/day fluid restriction.  If you have diabetes Accuchecks 4 times/day, Once in AM empty stomach and then before each meal. Log in all results and show them to your primary doctor at your next visit. If any glucose reading is under 80 or above 300 call your primary MD immediately.  If you have Seizure/Convulsions/Epilepsy: Please do not drive, operate heavy machinery, participate in activities at heights or participate in high speed sports until you have seen by Primary MD or a Neurologist and advised to do so again. Per [facility] statutes, patients with seizures are not allowed to drive until they have been seizure-free for six months.  Use caution when using heavy equipment or power tools. Avoid working on ladders or at heights. Take showers instead of baths. Ensure  the water temperature is not too high on the home water heater. Do not go swimming alone. Do not lock yourself in a room alone (i.e. bathroom). When caring for infants or small children, sit down when holding, feeding, or changing them to minimize risk of injury to the child in the event you have a seizure. Maintain good sleep hygiene. Avoid alcohol.   If you had Gastrointestinal Bleeding: Please ask your Primary MD to check a complete blood count within one week of discharge or at your next visit. Your endoscopic/colonoscopic biopsies that are pending at the time of discharge, will also need to followed by your Primary MD.  Get  Medicines reviewed and adjusted. Please take all your medications with you for your next visit with your Primary MD  Please request your Primary MD to go over all hospital tests and procedure/radiological results at the follow up, please ask your Primary MD to get all Hospital records sent to his/her office.  If you experience worsening of your admission symptoms, develop shortness of breath, life threatening emergency, suicidal or homicidal thoughts you must seek medical attention immediately by calling 911 or calling your MD immediately  if symptoms less severe.  You must read complete instructions/literature along with all the possible adverse reactions/side effects for all the Medicines you take and that have been prescribed to you. Take any new Medicines after you have completely understood and accpet all the possible adverse reactions/side effects.   Do not drive or operate heavy machinery when taking Pain medications.   Do not take more than prescribed Pain, Sleep and Anxiety Medications  Special Instructions: If you have smoked or chewed Tobacco  in the last 2 yrs please stop smoking, stop any regular Alcohol  and or any Recreational drug use.  Wear Seat belts while driving.  Please note You were cared for by a hospitalist during your hospital stay. If you have any questions about your discharge medications or the care you received while you were in the hospital after you are discharged, you can call the unit and asked to speak with the hospitalist on call if the hospitalist that took care of you is not available. Once you are discharged, your primary care physician will handle any further medical issues. Please note that NO REFILLS for any discharge medications will be authorized once you are discharged, as it is imperative that you return to your primary care physician (or establish a relationship with a primary care physician if you do not have one) for your aftercare needs so that they  can reassess your need for medications and monitor your lab values.  You can reach the hospitalist office at phone [phone] or fax [phone]   If you do not have a primary care physician, you can call [phone] for a physician referral.

## 2022-09-10 NOTE — Plan of Care (Signed)

## 2022-09-11 ENCOUNTER — Encounter: Payer: Self-pay | Admitting: Internal Medicine

## 2022-09-11 ENCOUNTER — Other Ambulatory Visit (HOSPITAL_COMMUNITY): Payer: Self-pay

## 2022-09-11 ENCOUNTER — Telehealth: Payer: Self-pay | Admitting: Physician Assistant

## 2022-09-11 DIAGNOSIS — K859 Acute pancreatitis without necrosis or infection, unspecified: Secondary | ICD-10-CM | POA: Diagnosis not present

## 2022-09-11 DIAGNOSIS — C3490 Malignant neoplasm of unspecified part of unspecified bronchus or lung: Secondary | ICD-10-CM | POA: Diagnosis not present

## 2022-09-11 DIAGNOSIS — C7931 Secondary malignant neoplasm of brain: Secondary | ICD-10-CM | POA: Diagnosis not present

## 2022-09-11 LAB — COMPREHENSIVE METABOLIC PANEL
ALT: 178 U/L — ABNORMAL HIGH (ref 0–44)
AST: 74 U/L — ABNORMAL HIGH (ref 15–41)
Albumin: 2.5 g/dL — ABNORMAL LOW (ref 3.5–5.0)
Alkaline Phosphatase: 58 U/L (ref 38–126)
Anion gap: 8 (ref 5–15)
BUN: 14 mg/dL (ref 8–23)
CO2: 28 mmol/L (ref 22–32)
Calcium: 8.6 mg/dL — ABNORMAL LOW (ref 8.9–10.3)
Chloride: 102 mmol/L (ref 98–111)
Creatinine, Ser: 0.62 mg/dL (ref 0.44–1.00)
GFR, Estimated: >60 mL/min (ref >60)
Glucose, Bld: 270 mg/dL — ABNORMAL HIGH (ref 70–99)
Potassium: 3.7 mmol/L (ref 3.5–5.1)
Sodium: 138 mmol/L (ref 135–145)
Total Bilirubin: 0.6 mg/dL (ref 0.3–1.2)
Total Protein: 5.3 g/dL — ABNORMAL LOW (ref 6.5–8.1)

## 2022-09-11 LAB — CBC
HCT: 29.6 % — ABNORMAL LOW (ref 36.0–46.0)
Hemoglobin: 9.4 g/dL — ABNORMAL LOW (ref 12.0–15.0)
MCH: 30.6 pg (ref 26.0–34.0)
MCHC: 31.8 g/dL (ref 30.0–36.0)
MCV: 96.4 fL (ref 80.0–100.0)
Platelets: 102 10*3/uL — ABNORMAL LOW (ref 150–400)
RBC: 3.07 MIL/uL — ABNORMAL LOW (ref 3.87–5.11)
RDW: 15.8 % — ABNORMAL HIGH (ref 11.5–15.5)
WBC: 2.7 10*3/uL — ABNORMAL LOW (ref 4.0–10.5)
nRBC: 1.1 % — ABNORMAL HIGH (ref 0.0–0.2)

## 2022-09-11 LAB — GLUCOSE, CAPILLARY
Glucose-Capillary: 198 mg/dL — ABNORMAL HIGH (ref 70–99)
Glucose-Capillary: 215 mg/dL — ABNORMAL HIGH (ref 70–99)
Glucose-Capillary: 272 mg/dL — ABNORMAL HIGH (ref 70–99)
Glucose-Capillary: 316 mg/dL — ABNORMAL HIGH (ref 70–99)

## 2022-09-11 LAB — LIPASE, BLOOD: Lipase: 372 U/L — ABNORMAL HIGH (ref 11–51)

## 2022-09-11 MED ORDER — HEPARIN SOD (PORK) LOCK FLUSH 100 UNIT/ML IV SOLN: 500.0000 [IU] | INTRAVENOUS | Status: DC | PRN

## 2022-09-11 MED ORDER — PANCRELIPASE (LIP-PROT-AMYL) 36000-114000 UNITS PO CPEP
36000.0000 [IU] | ORAL_CAPSULE | Freq: Three times a day (TID) | ORAL | 0 refills | Status: AC
  Filled 2022-09-11: qty 15, 5d supply, fill #0

## 2022-09-11 MED ORDER — LANTUS SOLOSTAR 100 UNIT/ML ~~LOC~~ SOPN
15.0000 [IU] | PEN_INJECTOR | Freq: Every day | SUBCUTANEOUS | Status: DC
Start: 2022-09-12 — End: 2022-11-27

## 2022-09-11 MED ORDER — PREDNISONE 20 MG PO TABS
ORAL_TABLET | ORAL | 1 refills | Status: DC
  Filled 2022-09-11: qty 60, 40d supply, fill #0

## 2022-09-11 MED ORDER — OXYCODONE HCL 5 MG PO TABS
5.0000 mg | ORAL_TABLET | Freq: Four times a day (QID) | ORAL | Status: DC | PRN
  Administered 2022-09-12: 5 mg via ORAL
  Filled 2022-09-11: qty 1

## 2022-09-11 MED ORDER — INSULIN GLARGINE-YFGN 100 UNIT/ML ~~LOC~~ SOLN
15.0000 [IU] | Freq: Every day | SUBCUTANEOUS | Status: DC
  Administered 2022-09-12: 15 [IU] via SUBCUTANEOUS
  Filled 2022-09-11: qty 0.15

## 2022-09-11 MED ORDER — ENSURE MAX PROTEIN PO LIQD
11.0000 [oz_av] | Freq: Two times a day (BID) | ORAL | 15 refills | Status: DC
  Filled 2022-09-11: qty 330, 1d supply, fill #0

## 2022-09-11 MED ORDER — HYDROMORPHONE HCL 1 MG/ML IJ SOLN
0.5000 mg | INTRAMUSCULAR | Status: DC | PRN
  Administered 2022-09-11 (×2): 0.5 mg via INTRAVENOUS
  Filled 2022-09-11 (×2): qty 0.5

## 2022-09-11 NOTE — Plan of Care (Signed)
  Problem: Activity: Goal: Risk for activity intolerance will decrease Outcome: Progressing   Problem: Coping: Goal: Level of anxiety will decrease Outcome: Progressing   Problem: Pain Managment: Goal: General experience of comfort will improve Outcome: Progressing   Problem: Safety: Goal: Ability to remain free from injury will improve Outcome: Progressing   

## 2022-09-11 NOTE — Telephone Encounter (Signed)
The voicemail was full on all numbers in patient's profile, unable to leave message regarding appointments

## 2022-09-11 NOTE — Progress Notes (Signed)
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 PD-L1 expression is negative.   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 First dose was given on March 06, 2022.   Subjective: The patient is seen and examined today.  She was admitted to the hospital with abdominal pain as well as nausea and diagnosed with acute pancreatitis.  She had CTA angiogram of the chest as well as CT scan of the abdomen and pelvis performed on her admission on 09/04/2022 and that showed no acute intrathoracic, abdominal or pelvic pathology but there was some evidence of cholelithiasis as well as colonic diverticulosis.  There was also mild stranding of the upper mesentery and adjacent to the pancreas which may represent acute pancreatitis with a loculated appearing fluid collection measuring up to 6 x 14 cm along the greater curvature of the stomach most consistent with a pseudocyst.  The patient had MRI of the abdomen with MRCP protocol and that showed mild inflammatory fat stranding about the pancreas with subcentimeter foci of hypoenhancing fluid signal in the superior pancreatic head, pancreatic neck and proximal pancreatic body consistent with a small foci of parenchymal necrosis and developing acute pancreatic fluid collections.  It also showed the large loculated rim-enhancing fluid collection overlying the  greater curvature of the stomach measuring 13.5 x 11.1 x 6.5 cm new compared to the prior examination and consistent with an acute pancreatic fluid collection/developing pseudocyst.  There was also suspicious of at least 1 tiny gallstone in the distal common bile duct near the ampulla measuring no greater than 0.3 cm and no biliary ductal dilatation but there was also evidence of cholelithiasis.  The patient was seen by Dr. Ewing Schlein and he recommended continuous observation and monitoring.  There was some concern about immunotherapy mediated pancreatitis and I started the patient on high-dose prednisone for now.  When seen today she is feeling fine with no concerning complaints except for mild abdominal pain but no significant nausea, vomiting, diarrhea or constipation.  She has no fever or chills.  She has no chest pain or shortness of breath.  Objective: Vital signs in last 24 hours: Temp:  [97.8 F (36.6 C)-98.3 F (36.8 C)] 98 F (36.7 C) (06/26 9562) Pulse Rate:  [61-64] 61 (06/26 0608) Resp:  [18] 18 (06/26 0608) BP: (125-146)/(50-79) 125/51 (06/26 0608) SpO2:  [93 %-96 %] 96 % (06/26 1308)  Intake/Output from previous day: 06/25 0701 - 06/26 0700 In: 1385 [P.O.:1385] Out: -  Intake/Output this shift: No intake/output data recorded.  General appearance: alert, cooperative, appears stated age, and fatigued Resp: clear to auscultation bilaterally and normal percussion bilaterally Cardio: regular rate and rhythm, S1, S2 normal, no murmur, click, rub or gallop GI: soft, non-tender; bowel sounds normal; no masses,  no organomegaly Extremities: extremities normal, atraumatic, no cyanosis or edema  Lab Results:  Recent Labs  09/11/22 0440  WBC 2.7*  HGB 9.4*  HCT 29.6*  PLT 102*   BMET Recent Labs    09/10/22 0333 09/11/22 0440  NA 142 138  K 3.9 3.7  CL 103 102  CO2 29 28  GLUCOSE 154* 270*  BUN 13 14  CREATININE 0.58 0.62  CALCIUM 8.9 8.6*    Studies/Results: No  results found.  Medications: I have reviewed the patient's current medications.   Assessment/Plan: This is a very pleasant 62 years old white female with history of a stage IV non-small cell lung cancer, adenosquamous carcinoma diagnosed in November 2023 with no actionable mutations and negative PD-L1 expression.  She is status post SRS to brain metastasis and currently undergoing systemic treatment initially with carboplatin, Alimta and Keytruda for 4 cycles and she is on maintenance treatment with Alimta and Keytruda every 3 weeks for 4 more cycles.  She has been tolerating her treatment well except for recent intermittent nausea and vomiting as well as abdominal pain. Imaging studies during this admission showed suspicious acute pancreatitis that could be secondary to cholelithiasis and biliary stone but immunotherapy mediated pancreatitis could not be completely excluded.  The patient was seen by Dr. Ewing Schlein from gastroenterology and he recommended continuous observation and monitoring.  I empirically started her on high-dose prednisone for the possible immunotherapy mediated pancreatitis but we can taper this quickly as the patient may have another explanation for her pancreatitis. I will arrange for her repeat blood work next week and follow-up appointment at the cancer center as previously scheduled. The patient is feeling much better today and I think she will be able to go home as long as she does not have any other concerning issues. Thank you for taking good care of Carolyn Hooper.  Please call if you have any questions.     LOS: 6 days    Carolyn Hooper 09/11/2022

## 2022-09-11 NOTE — Progress Notes (Signed)
PROGRESS NOTE   Carolyn Hooper  LOV:[id number]    DOB: 03/03/1961    DOA: 09/04/2022  PCP: [name], FNP   I have briefly reviewed patients previous medical records in [facility].  Chief Complaint  Patient presents with   Back Pain    Brief Narrative:  62 y.o. female with medical history significant of DM2, NSCLC with brain mets currently on immunotherapy/chemotherapy, focal seizures on Keppra.  Follows with Dr. Arbutus Ped and Dr. Barbaraann Cao. Also on Decadron taper for brain mets.  She presented to the ED with complaints of epigastric abdominal pain x 4 days.  Admitted for acute pancreatitis with pseudocyst.  Quite slow to progress/improve.  Pancreatitis suspected due to immunotherapy mediated pancreatitis versus gallstone pancreatitis.  Decadron changed to high-dose prednisone taper for immunotherapy mediated pancreatitis.  Hopeful DC home in the next 24 hours.   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 Irene Limbo discussed with Dr. Arbutus Ped, could be related to immunotherapy which will be discontinued as an outpatient.  He recommended prednisone 1 mg/kg daily with slow taper as an outpatient.  Patient was on Decadron for metastatic disease to the brain and now is on high-dose prednisone taper. DDx include immunotherapy related versus gallstone-induced. Lipase slowly creeping up; AST and ALT elevated mildly but overall stable.Per GI ok to advance diet. Monitor labs; if condition worsens or labs increase, GI might consider ERCP. Close follow-up with GI for monitoring of pseudocyst. If signs of gastric outlet obstruction will need EUS for  drainage. Started on Creon 6/25 x 3 to 5 days As communicated with Dr. Ewing Schlein, cleared from his standpoint for DC if tolerating diet with outpatient follow-up with him in 1 month. Patient was feeling better this morning with improved pain despite oral intake but since then developed some worsening pain and does not feel comfortable discharging home today.  Continue soft diet, multimodality pain control, monitor and reassess in AM.   Pyuria Asymptomatic.  No treatment indicated.   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  Now on high-dose prednisone taper.   Metastatic non-small cell lung cancer [facility]) Primary oncologist notified of admission. Message also sent to Dr. Barbaraann Cao I communicated with Dr. Arbutus Ped on 6/26.   Uncontrolled type 2 diabetes mellitus with hyperglycemia, without long-term current use of insulin (HCC) CBG labile.   Given that she will be on high-dose prednisone, CBGs elevated and labile, increased Lantus to 15 units daily. Monitor closely and adjust insulins as needed.  Pancytopenia: Unclear etiology.?  Related to recent immunotherapy versus acute illness. Follow CBC with differential in AM. Communicated with Dr. Arbutus Ped to have lab work repeated again on 7/1 or 7/2 as outpatient.  Body mass index is 34.53 kg/m.  Nutritional Status Nutrition Problem: Increased nutrient needs Etiology: chronic illness Signs/Symptoms: estimated needs Interventions: Refer to RD note for recommendations   ACP Documents: None present. DVT prophylaxis: enoxaparin (LOVENOX) injection 40 mg Start: 09/05/22 1000     Code Status: Full Code:  Family Communication: None at bedside. Disposition:  Status is: Inpatient Remains inpatient appropriate because: Pending tolerating advancing diet with  improved pain.  Hopeful DC in the next 24 hours.     Consultants:   [facility]  Procedures:      Antimicrobials:      Subjective:  When seen this morning, reported that she was doing better, tolerating soft diet with intermittent mild pain.  Advised her that it may take several days or even a couple of weeks for the pain to subside from the pancreatitis/inflammation.  Had BM.  Was agreeable to DC home, discharge paperwork completed but then patient reported worsening abdominal pain 8/10 after nutritional supplement, improved after pain meds but she feels uncomfortable discharging home today.  Objective:   Vitals:   09/10/22 1405 09/10/22 1953 09/11/22 0608 09/11/22 1430  BP: (!) 146/79 (!) 141/50 (!) 125/51 135/73  Pulse: 64 61 61 60  Resp: 18 18 18 17   Temp: 98.3 F (36.8 C) 97.8 F (36.6 C) 98 F (36.7 C) 97.9 F (36.6 C)  TempSrc: Oral Oral Oral   SpO2: 93% 96% 96% 96%  Weight:      Height:        General exam: Middle-age female, moderately built and nourished sitting up comfortably at edge of bed this morning. Respiratory system: Clear to auscultation. Respiratory effort normal.  Right upper anterior chest Port-A-Cath. Cardiovascular system: S1 & S2 heard, RRR. No JVD, murmurs, rubs, gallops or clicks. No pedal edema. Gastrointestinal system: Abdomen is nondistended, soft and nontender. No organomegaly or masses felt. Normal bowel sounds heard. Central nervous system: Alert and oriented. No focal neurological deficits. Extremities: Symmetric 5 x 5 power. Skin: No rashes, lesions or ulcers Psychiatry: Judgement and insight appear normal. Mood & affect appropriate.     Data Reviewed:   I have personally reviewed following labs and imaging studies   CBC: Recent Labs  Lab 09/06/22 0340 09/07/22 0304 09/08/22 0253 09/11/22 0440  WBC 8.1 5.7 4.9 2.7*  NEUTROABS 6.7 4.9 3.8  --   HGB 10.2* 9.7* 9.6* 9.4*  HCT 32.4* 30.6* 29.8* 29.6*  MCV 100.6* 99.4 98.7 96.4  PLT 170 164 156 102*    Basic Metabolic Panel: Recent Labs  Lab 09/07/22 0304 09/08/22 0253  09/09/22 0307 09/10/22 0333 09/11/22 0440  NA 138 139 139 142 138  K 4.2 3.8 3.9 3.9 3.7  CL 103 104 102 103 102  CO2 28 28 28 29 28   GLUCOSE 175* 147* 223* 154* 270*  BUN 8 7* 9 13 14   CREATININE 0.46 0.47 0.62 0.58 0.62  CALCIUM 8.6* 8.5* 8.4* 8.9 8.6*    Liver Function Tests: Recent Labs  Lab 09/07/22 0304 09/08/22 0253 09/09/22 0307 09/10/22 0333 09/11/22 0440  AST 30 79* 101* 111* 74*  ALT 65* 108* 166* 207* 178*  ALKPHOS 63 53 55 61 58  BILITOT 0.9 0.9 0.7 0.8 0.6  PROT 5.4* 5.3* 5.2* 5.6* 5.3*  ALBUMIN 2.4* 2.4* 2.3* 2.6* 2.5*    CBG: Recent Labs  Lab 09/10/22 2138 09/11/22 0730 09/11/22 1158  GLUCAP 327* 198* 215*    Microbiology Studies:   Recent Results (from the past 240 hour(s))  Urine Culture (for pregnant, neutropenic or urologic patients or patients with an indwelling urinary catheter)     Status: Abnormal   Collection Time: 09/04/22  4:31 PM   Specimen: Urine, Clean Catch  Result Value Ref Range Status   Specimen Description   Final    URINE, CLEAN CATCH Performed at [facility], [address] [place], [place] [address]    Special  Requests   Final    NONE Performed at [facility], [address], [place], [place] [address]    Culture >=100,000 COLONIES/mL ESCHERICHIA COLI (A)  Final   Report Status 09/06/2022 FINAL  Final   Organism ID, Bacteria ESCHERICHIA COLI (A)  Final      Susceptibility   Escherichia coli - MIC*    AMPICILLIN >=32 RESISTANT Resistant     CEFAZOLIN <=4 SENSITIVE Sensitive     CEFEPIME <=0.12 SENSITIVE Sensitive     CEFTRIAXONE <=0.25 SENSITIVE Sensitive     CIPROFLOXACIN <=0.25 SENSITIVE Sensitive     GENTAMICIN <=1 SENSITIVE Sensitive     IMIPENEM <=0.25 SENSITIVE Sensitive     NITROFURANTOIN <=16 SENSITIVE Sensitive     TRIMETH/SULFA <=20 SENSITIVE Sensitive     AMPICILLIN/SULBACTAM 16 INTERMEDIATE Intermediate     PIP/TAZO <=4 SENSITIVE Sensitive     * >=100,000  COLONIES/mL ESCHERICHIA COLI    Radiology Studies:  No results found.  Scheduled Meds:    Chlorhexidine Gluconate Cloth  6 each Topical Daily   clonazePAM  0.5 mg Oral BID   enoxaparin (LOVENOX) injection  40 mg Subcutaneous Q24H   folic acid  1 mg Oral Daily   insulin aspart  0-5 Units Subcutaneous QHS   insulin aspart  0-9 Units Subcutaneous TID WC   insulin glargine-yfgn  12 Units Subcutaneous Daily   levETIRAcetam  1,000 mg Oral BID   lipase/protease/amylase  36,000 Units Oral TID WC   pantoprazole  40 mg Oral Daily   predniSONE  100 mg Oral Q breakfast   Followed by   Melene Muller ON 09/17/2022] predniSONE  80 mg Oral Q breakfast   Followed by   Melene Muller ON 09/24/2022] predniSONE  60 mg Oral Q breakfast   Followed by   Melene Muller ON 10/01/2022] predniSONE  40 mg Oral Q breakfast   Followed by   Melene Muller ON 10/08/2022] predniSONE  20 mg Oral Q breakfast   Followed by   Melene Muller ON 10/15/2022] predniSONE  10 mg Oral Q breakfast   Ensure Max Protein  11 oz Oral BID   senna  1 tablet Oral QHS    Continuous Infusions:     LOS: 6 days     Marcellus Scott, MD,  FACP, FHM, SFHM, [facility], [facility]   [facility] [place]     To contact the attending provider between 7A-7P or the covering provider during after hours 7P-7A, please log into the web site [email or web address] and access using universal [place] password for that web site. If you do not have the password, please call the hospital operator.  09/11/2022, 4:09 PM

## 2022-09-12 ENCOUNTER — Other Ambulatory Visit (HOSPITAL_COMMUNITY): Payer: Self-pay

## 2022-09-12 DIAGNOSIS — K859 Acute pancreatitis without necrosis or infection, unspecified: Secondary | ICD-10-CM | POA: Diagnosis not present

## 2022-09-12 LAB — CBC WITH DIFFERENTIAL/PLATELET
Abs Immature Granulocytes: 0.24 10*3/uL — ABNORMAL HIGH (ref 0.00–0.07)
Basophils Absolute: 0 10*3/uL (ref 0.0–0.1)
Basophils Relative: 1 %
Eosinophils Absolute: 0 10*3/uL (ref 0.0–0.5)
Eosinophils Relative: 0 %
HCT: 30.6 % — ABNORMAL LOW (ref 36.0–46.0)
Hemoglobin: 9.7 g/dL — ABNORMAL LOW (ref 12.0–15.0)
Immature Granulocytes: 7 %
Lymphocytes Relative: 34 %
Lymphs Abs: 1.2 10*3/uL (ref 0.7–4.0)
MCH: 30.8 pg (ref 26.0–34.0)
MCHC: 31.7 g/dL (ref 30.0–36.0)
MCV: 97.1 fL (ref 80.0–100.0)
Monocytes Absolute: 0.4 10*3/uL (ref 0.1–1.0)
Monocytes Relative: 12 %
Neutro Abs: 1.7 10*3/uL (ref 1.7–7.7)
Neutrophils Relative %: 46 %
Platelets: 114 10*3/uL — ABNORMAL LOW (ref 150–400)
RBC: 3.15 MIL/uL — ABNORMAL LOW (ref 3.87–5.11)
RDW: 15.9 % — ABNORMAL HIGH (ref 11.5–15.5)
WBC: 3.7 10*3/uL — ABNORMAL LOW (ref 4.0–10.5)
nRBC: 0.8 % — ABNORMAL HIGH (ref 0.0–0.2)

## 2022-09-12 LAB — COMPREHENSIVE METABOLIC PANEL
ALT: 180 U/L — ABNORMAL HIGH (ref 0–44)
AST: 69 U/L — ABNORMAL HIGH (ref 15–41)
Albumin: 2.6 g/dL — ABNORMAL LOW (ref 3.5–5.0)
Alkaline Phosphatase: 62 U/L (ref 38–126)
Anion gap: 9 (ref 5–15)
BUN: 19 mg/dL (ref 8–23)
CO2: 27 mmol/L (ref 22–32)
Calcium: 8.6 mg/dL — ABNORMAL LOW (ref 8.9–10.3)
Chloride: 102 mmol/L (ref 98–111)
Creatinine, Ser: 0.77 mg/dL (ref 0.44–1.00)
GFR, Estimated: >60 mL/min (ref >60)
Glucose, Bld: 217 mg/dL — ABNORMAL HIGH (ref 70–99)
Potassium: 3.6 mmol/L (ref 3.5–5.1)
Sodium: 138 mmol/L (ref 135–145)
Total Bilirubin: 0.6 mg/dL (ref 0.3–1.2)
Total Protein: 5.4 g/dL — ABNORMAL LOW (ref 6.5–8.1)

## 2022-09-12 LAB — GLUCOSE, CAPILLARY
Glucose-Capillary: 143 mg/dL — ABNORMAL HIGH (ref 70–99)
Glucose-Capillary: 182 mg/dL — ABNORMAL HIGH (ref 70–99)
Glucose-Capillary: 269 mg/dL — ABNORMAL HIGH (ref 70–99)

## 2022-09-12 LAB — LIPASE, BLOOD: Lipase: 347 U/L — ABNORMAL HIGH (ref 11–51)

## 2022-09-12 MED ORDER — OXYCODONE HCL 5 MG PO TABS
5.0000 mg | ORAL_TABLET | Freq: Three times a day (TID) | ORAL | 0 refills | Status: DC | PRN
  Filled 2022-09-12: qty 10, 4d supply, fill #0

## 2022-09-12 MED ORDER — HEPARIN SOD (PORK) LOCK FLUSH 100 UNIT/ML IV SOLN
500.0000 [IU] | INTRAVENOUS | Status: DC
  Administered 2022-09-12: 500 [IU]
  Filled 2022-09-12: qty 5

## 2022-09-12 MED ORDER — HYDROMORPHONE HCL 1 MG/ML IJ SOLN
1.0000 mg | INTRAMUSCULAR | Status: DC | PRN
  Filled 2022-09-12: qty 1

## 2022-09-12 MED ORDER — PANTOPRAZOLE SODIUM 40 MG PO TBEC
40.0000 mg | DELAYED_RELEASE_TABLET | Freq: Every day | ORAL | 1 refills | Status: DC
  Filled 2022-09-12: qty 30, 30d supply, fill #0

## 2022-09-12 NOTE — Plan of Care (Signed)
  Problem: Education: Goal: Knowledge of General Education information will improve Description: Including pain rating scale, medication(s)/side effects and non-pharmacologic comfort measures 09/12/2022 1647 by Delila Spence, LPN Outcome: Adequate for Discharge 09/12/2022 0751 by Delila Spence, LPN Outcome: Progressing   Problem: Health Behavior/Discharge Planning: Goal: Ability to manage health-related needs will improve 09/12/2022 1647 by Delila Spence, LPN Outcome: Adequate for Discharge 09/12/2022 0751 by Delila Spence, LPN Outcome: Progressing   Problem: Clinical Measurements: Goal: Ability to maintain clinical measurements within normal limits will improve Outcome: Adequate for Discharge Goal: Will remain free from infection Outcome: Adequate for Discharge Goal: Diagnostic test results will improve Outcome: Adequate for Discharge Goal: Respiratory complications will improve Outcome: Adequate for Discharge Goal: Cardiovascular complication will be avoided Outcome: Adequate for Discharge   Problem: Activity: Goal: Risk for activity intolerance will decrease 09/12/2022 1647 by Delila Spence, LPN Outcome: Adequate for Discharge 09/12/2022 0751 by Delila Spence, LPN Outcome: Progressing   Problem: Nutrition: Goal: Adequate nutrition will be maintained Outcome: Adequate for Discharge   Problem: Coping: Goal: Level of anxiety will decrease Outcome: Adequate for Discharge   Problem: Elimination: Goal: Will not experience complications related to bowel motility Outcome: Adequate for Discharge Goal: Will not experience complications related to urinary retention Outcome: Adequate for Discharge   Problem: Pain Managment: Goal: General experience of comfort will improve Outcome: Adequate for Discharge   Problem: Safety: Goal: Ability to remain free from injury will improve Outcome: Adequate for Discharge   Problem: Skin Integrity: Goal: Risk for impaired  skin integrity will decrease Outcome: Adequate for Discharge

## 2022-09-12 NOTE — Plan of Care (Signed)
  Problem: Education: Goal: Knowledge of General Education information will improve Description: Including pain rating scale, medication(s)/side effects and non-pharmacologic comfort measures Outcome: Progressing   Problem: Health Behavior/Discharge Planning: Goal: Ability to manage health-related needs will improve Outcome: Progressing   Problem: Activity: Goal: Risk for activity intolerance will decrease Outcome: Progressing   

## 2022-09-12 NOTE — Discharge Summary (Signed)
Physician Discharge Summary  Carolyn Hooper:[id number] DOB: 1961/02/18  PCP: [name], FNP  Admitted from: Home Discharged to: Home  Admit date: 09/04/2022 Discharge date: 09/12/2022  Recommendations for Outpatient Follow-up:    Follow-up Information     [name], FNP. Schedule an appointment as soon as possible for a visit in 1 week(s).   Specialty: Family Medicine Why: To be seen with repeat labs (CBC with differential, CMP & lipase).  Labs may be done at the cancer center (requested to be done on 7/1 or 7/2) but need to make sure that they are done somewhere and followed up closely as outpatient. Contact information: [address] [place] [address] [phone]         Vida Rigger, MD. Schedule an appointment as soon as possible for a visit in 1 month(s).   Specialty: Gastroenterology Contact information: [address] [place] [address] [phone]         [name], MD. Schedule an appointment as soon as possible for a visit.   Specialty: Oncology Why: Kindly call to have repeat lab work done (CBC with differential, CMP & lipase) early next week, i.e. 7/1 or 7/2. Contact information: [address] [place] [address] [phone]                  Home Health: None    Equipment/Devices: None    Discharge Condition: Improved and stable.   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 female with medical history significant of DM2, NSCLC with brain mets currently on immunotherapy/chemotherapy, focal seizures on Keppra.  Follows with Dr. Arbutus Ped and Dr. Barbaraann Cao. Also on Decadron taper for brain mets.  She presented to the ED with complaints of epigastric abdominal pain x 4 days.  Admitted for acute pancreatitis with pseudocyst.  Quite slow to progress/improve.  Pancreatitis suspected due to immunotherapy mediated pancreatitis versus gallstone pancreatitis.  Decadron changed to high-dose prednisone taper for immunotherapy mediated pancreatitis.       Assessment & Plan:    Acute pancreatitis complicated by necrosis and large pseudocyst Modest LFT elevation Dr. Irene Limbo discussed with Dr. Arbutus Ped, could be related to immunotherapy which will be discontinued as an outpatient.  He recommended prednisone 1 mg/kg daily with slow taper as an outpatient.  Patient was on Decadron for metastatic disease to the brain and now is on high-dose prednisone taper. DDx include immunotherapy related versus gallstone-induced. Lipase slowly trending down; AST and ALT elevated mildly but overall stable.Per GI ok to advance diet. Monitor labs; if condition worsens or labs increase, GI might consider ERCP. Close follow-up with GI for monitoring of pseudocyst. If signs of gastric outlet obstruction will need EUS for drainage. Started on Creon 6/25 x 3 to  5 days As communicated with Dr. Ewing Schlein 6/26, cleared from his standpoint for DC if tolerating diet with outpatient follow-up with him in 1 month. 6/26: Patient was feeling better in the morning with improved pain despite oral intake but since then developed some worsening pain and did not feel comfortable discharging home yesterday.  Continue soft diet, multimodality pain control, monitor and reassess in AM. Since this morning, patient has tolerated 80-100% of meals.  Has only used a dose of oxycodone  early this morning.  Did use 2 doses of IV Dilaudid yesterday.  No nausea or vomiting.  Feels comfortable discharging home.   Pyuria Asymptomatic.  No treatment indicated.   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  Now on high-dose prednisone taper.   Metastatic non-small cell lung cancer [facility]) Primary oncologist notified of admission. Message also sent to Dr. Barbaraann Cao I communicated with Dr. Arbutus Ped on 6/26.   Uncontrolled type 2 diabetes mellitus with hyperglycemia, without long-term current use of insulin (HCC) CBG labile.   Given that she will be on high-dose prednisone, CBGs elevated and labile, increased Lantus to 15 units daily. Monitor closely as outpatient and adjust insulins as needed.   Pancytopenia: Unclear etiology.?  Related to recent immunotherapy versus acute illness. Better this morning. Communicated with Dr. Arbutus Ped to have lab work repeated again on 7/1 or 7/2 as outpatient.   Body mass index is 34.53 kg/m.   Nutritional Status Nutrition Problem: Increased nutrient needs Etiology: chronic illness Signs/Symptoms: estimated needs Interventions: Refer to RD note for recommendations       Consultants:   [facility]   Procedures:   None   Discharge Instructions  Discharge Instructions     Call MD for:  difficulty breathing, headache or visual disturbances   Complete by: As directed    Call MD for:  extreme fatigue   Complete by: As directed    Call MD for:  persistant dizziness or light-headedness   Complete by: As directed    Call MD for:  persistant nausea and vomiting   Complete by: As directed    Call MD for:  severe uncontrolled pain   Complete by: As directed    Call MD for:  temperature >100.4   Complete by: As directed    Diet - low sodium heart healthy   Complete by: As directed    Diet Carb Modified   Complete by: As directed     Discharge instructions   Complete by: As directed    Acetaminophen (present in the Norco and Tylenol that you take) dose from all sources not to exceed 4 g/day.   Increase activity slowly   Complete by: As directed    Referral to Nutrition and Diabetes Services   Complete by: As directed    Choose type of Diabetes Self-Management Training (DSMT) training services and number of hours requested: Initial DSMT: 10 hours   Check all special needs that apply to patient requiring 1 on 1 DSMT: Does not apply   DSMT Content: Diabetes disease process   Choose the type of Medical Nutrition Therapy (MNT) and number of hours: Does not apply   FOR MEDICARE PATIENTS: I hereby certify that I am managing this beneficiary's diabetes condition and that the above prescribed training is a necessary part of management.: Does not apply        Medication List     STOP taking these medications  dexamethasone 1 MG tablet Commonly known as: DECADRON   HYDROcodone-acetaminophen 5-325 MG tablet Commonly known as: Norco       TAKE these medications    albuterol 108 (90 Base) MCG/ACT inhaler Commonly known as: VENTOLIN HFA Inhale 2 puffs into the lungs every 6 (six) hours as needed.   clonazePAM 0.5 MG tablet Commonly known as: KLONOPIN Take 1 tablet (0.5 mg total) by mouth 2 (two) times daily. What changed:  when to take this reasons to take this   Ensure Max Protein Liqd Take 330 mLs (11 oz total) by mouth 2 (two) times daily.   famotidine 20 MG tablet Commonly known as: PEPCID Take 1 tablet (20 mg total) by mouth 2 (two) times daily. What changed: Another medication with the same name was removed. Continue taking this medication, and follow the directions you see here.   fluconazole 150 MG tablet Commonly known as: DIFLUCAN Take 1 tablet by mouth every 3 days as needed (yeast infection).   folic acid 1 MG tablet Commonly known as: FOLVITE Take 1 tablet (1 mg total) by mouth  daily. What changed: when to take this   furosemide 20 MG tablet Commonly known as: LASIX Take 20 mg by mouth daily as needed for fluid or edema.   Lantus SoloStar 100 UNIT/ML Solostar Pen Generic drug: insulin glargine Inject 15 Units into the skin daily. What changed: how much to take   levETIRAcetam 1000 MG tablet Commonly known as: KEPPRA Take 1 tablet (1,000 mg total) by mouth 2 (two) times daily.   lidocaine 5 % ointment Commonly known as: XYLOCAINE Apply topically as needed. What changed: reasons to take this   lidocaine-prilocaine cream Commonly known as: EMLA Apply to port prior to access   lipase/protease/amylase 18841 UNITS Cpep capsule Commonly known as: CREON Take 1 capsule (36,000 Units total) by mouth 3 (three) times daily with meals for 5 days.   metFORMIN 1000 MG tablet Commonly known as: GLUCOPHAGE Take 1 tablet (1,000 mg total) by mouth 2 (two) times daily.   Mylanta Maximum Strength 400-400-40 MG/5ML suspension Generic drug: alum & mag hydroxide-simeth Take 15 mLs by mouth See admin instructions. Take 15 ml's by mouth midday and bedtime   ondansetron 4 MG tablet Commonly known as: ZOFRAN Take 1 tablet (4 mg total) by mouth every 6 (six) hours as needed for nausea. What changed: how much to take   oxyCODONE 5 MG immediate release tablet Commonly known as: Oxy IR/ROXICODONE Take 1 tablet (5 mg total) by mouth every 8 (eight) hours as needed for moderate pain or severe pain.   pantoprazole 40 MG tablet Commonly known as: PROTONIX Take 1 tablet (40 mg total) by mouth daily. Start taking on: September 13, 2022   predniSONE 20 MG tablet Commonly known as: DELTASONE Take 5 tablets daily x 5 days THEN take 4 tablets daily x 7 days THEN 3 tablets daily x 7 days THEN take 2 tablets daily x 7 days THEN take 1 tablet daily x 7 days THEN take 1/2 tablet daily x 7 days.  Follow-up with oncology MD regarding need for change in regimen.   prochlorperazine 10 MG  tablet Commonly known as: COMPAZINE Take 1 tablet (10 mg total) by mouth every 6 (six) hours as needed. What changed: reasons to take this   TechLite Pen Needles 32G X 4 MM Misc Generic drug: Insulin Pen Needle Use to inject insulin daily as directed.   triamcinolone ointment 0.5 % Commonly known as: KENALOG Apply  topically to affected area twice daily as directed,   TYLENOL 500 MG tablet Generic drug: acetaminophen Take 1,000 mg by mouth every 6 (six) hours as needed for mild pain or headache.       Allergies  Allergen Reactions   Adhesive [Tape] Dermatitis   Statins Itching and Other (See Comments)    Myalgias, also      Procedures/Studies: MR ABDOMEN MRCP W WO CONTAST  Result Date: 09/05/2022 CLINICAL DATA:  Pancreatitis suspected, cholelithiasis EXAM: MRI ABDOMEN WITHOUT AND WITH CONTRAST (INCLUDING MRCP) TECHNIQUE: Multiplanar multisequence MR imaging of the abdomen was performed both before and after the administration of intravenous contrast. Heavily T2-weighted images of the biliary and pancreatic ducts were obtained, and three-dimensional MRCP images were rendered by post processing. CONTRAST:  10mL GADAVIST GADOBUTROL 1 MMOL/ML IV SOLN COMPARISON:  CT abdomen pelvis, right upper quadrant ultrasound, 09/04/2022 CT abdomen pelvis, 07/27/2022 FINDINGS: Lower chest: Medial left lower lobe mass, partially imaged, with associated trace pleural effusion (series 3, image 2). Hepatobiliary: Mild, diffuse hepatic steatosis with focal fatty deposition in the central right lobe of the liver (series 8, image 39). Large gallstone near the gallbladder neck with additional gravel and sludge in the fundus. No gallbladder wall thickening. No biliary ductal dilatation. Suspect at least one tiny gallstone in the distal common bile duct near the ampulla, measuring no greater than 0.3 cm (series 3, image 26). Pancreas: Mild inflammatory fat stranding about the pancreas. Small areas of  hypoenhancing fluid signal in the superior pancreatic head, neck, and proximal body, largest individual focus measuring 0.8 cm in the superior pancreatic head (series 5, image 23, 18, series 15, image 55, 43). Spleen: Normal in size without significant abnormality. Adrenals/Urinary Tract: Adrenal glands are unremarkable. Kidneys are normal, without renal calculi, solid lesion, or hydronephrosis. Stomach/Bowel: Large, loculated, rim enhancing fluid collection overlying the greater curvature of the stomach measuring 13.5 x 11.1 x 6.5 cm, new compared to prior examination dated 07/27/2022. (Series 4, image 7, series 3, image 29). Stomach otherwise within normal limits. No evidence of bowel wall thickening, distention, or inflammatory changes. Vascular/Lymphatic: No significant vascular findings are present. No enlarged abdominal lymph nodes. Other: No abdominal wall hernia or abnormality. Trace perihepatic and perisplenic ascites. Musculoskeletal: No acute or significant osseous findings. IMPRESSION: 1. Mild inflammatory fat stranding about the pancreas. Subcentimeter foci of hypoenhancing fluid signal in the superior pancreatic head, pancreatic neck, and proximal pancreatic body, most consistent with small foci of parenchymal necrosis and developing acute pancreatic fluid collections. 2. Large, loculated, rim enhancing fluid collection overlying the greater curvature of the stomach measuring 13.5 x 11.1 x 6.5 cm, new compared to prior examination dated 07/27/2022 and consistent with an acute pancreatic fluid collection / developing pseudocyst. The presence or absence of infection is not established by imaging. 3. Suspect at least one tiny gallstone in the distal common bile duct near the ampulla, measuring no greater than 0.3 cm. No biliary ductal dilatation. 4. Cholelithiasis. 5. Medial left lower lobe mass, partially imaged, with associated trace pleural effusion, in keeping with known primary lung malignancy. 6.  Hepatic steatosis. These results will be called to the ordering clinician or representative by the Radiologist Assistant, and communication documented in the PACS or [employer]. Electronically Signed   By: Jearld Lesch M.D.   On: 09/05/2022 07:57   MR 3D Recon At Scanner  Result Date: 09/05/2022 CLINICAL DATA:  Pancreatitis suspected, cholelithiasis EXAM: MRI ABDOMEN WITHOUT AND WITH CONTRAST (INCLUDING MRCP) TECHNIQUE: Multiplanar multisequence  MR imaging of the abdomen was performed both before and after the administration of intravenous contrast. Heavily T2-weighted images of the biliary and pancreatic ducts were obtained, and three-dimensional MRCP images were rendered by post processing. CONTRAST:  10mL GADAVIST GADOBUTROL 1 MMOL/ML IV SOLN COMPARISON:  CT abdomen pelvis, right upper quadrant ultrasound, 09/04/2022 CT abdomen pelvis, 07/27/2022 FINDINGS: Lower chest: Medial left lower lobe mass, partially imaged, with associated trace pleural effusion (series 3, image 2). Hepatobiliary: Mild, diffuse hepatic steatosis with focal fatty deposition in the central right lobe of the liver (series 8, image 39). Large gallstone near the gallbladder neck with additional gravel and sludge in the fundus. No gallbladder wall thickening. No biliary ductal dilatation. Suspect at least one tiny gallstone in the distal common bile duct near the ampulla, measuring no greater than 0.3 cm (series 3, image 26). Pancreas: Mild inflammatory fat stranding about the pancreas. Small areas of hypoenhancing fluid signal in the superior pancreatic head, neck, and proximal body, largest individual focus measuring 0.8 cm in the superior pancreatic head (series 5, image 23, 18, series 15, image 55, 43). Spleen: Normal in size without significant abnormality. Adrenals/Urinary Tract: Adrenal glands are unremarkable. Kidneys are normal, without renal calculi, solid lesion, or hydronephrosis. Stomach/Bowel: Large, loculated, rim  enhancing fluid collection overlying the greater curvature of the stomach measuring 13.5 x 11.1 x 6.5 cm, new compared to prior examination dated 07/27/2022. (Series 4, image 7, series 3, image 29). Stomach otherwise within normal limits. No evidence of bowel wall thickening, distention, or inflammatory changes. Vascular/Lymphatic: No significant vascular findings are present. No enlarged abdominal lymph nodes. Other: No abdominal wall hernia or abnormality. Trace perihepatic and perisplenic ascites. Musculoskeletal: No acute or significant osseous findings. IMPRESSION: 1. Mild inflammatory fat stranding about the pancreas. Subcentimeter foci of hypoenhancing fluid signal in the superior pancreatic head, pancreatic neck, and proximal pancreatic body, most consistent with small foci of parenchymal necrosis and developing acute pancreatic fluid collections. 2. Large, loculated, rim enhancing fluid collection overlying the greater curvature of the stomach measuring 13.5 x 11.1 x 6.5 cm, new compared to prior examination dated 07/27/2022 and consistent with an acute pancreatic fluid collection / developing pseudocyst. The presence or absence of infection is not established by imaging. 3. Suspect at least one tiny gallstone in the distal common bile duct near the ampulla, measuring no greater than 0.3 cm. No biliary ductal dilatation. 4. Cholelithiasis. 5. Medial left lower lobe mass, partially imaged, with associated trace pleural effusion, in keeping with known primary lung malignancy. 6. Hepatic steatosis. These results will be called to the ordering clinician or representative by the Radiologist Assistant, and communication documented in the PACS or [employer]. Electronically Signed   By: Jearld Lesch M.D.   On: 09/05/2022 07:57   CT Angio Chest PE W and/or Wo Contrast  Addendum Date: 09/04/2022   ADDENDUM REPORT: 09/04/2022 21:30 ADDENDUM: There is mild stranding of the upper mesentery and adjacent to the  pancreas which may represent acute pancreatitis. Correlation with pancreatic enzymes recommended. Additionally there is a loculated appearing fluid collection measuring approximately 6 x 14 cm along the greater curvature of the stomach most consistent with a pseudocyst. Electronically Signed   By: Elgie Collard M.D.   On: 09/04/2022 21:30   Result Date: 09/04/2022 CLINICAL DATA:  Upper abdominal pain and back pain. History of lung cancer. EXAM: CT ANGIOGRAPHY CHEST CT ABDOMEN AND PELVIS WITH CONTRAST TECHNIQUE: Multidetector CT imaging of the chest was performed using the standard  protocol during bolus administration of intravenous contrast. Multiplanar CT image reconstructions and MIPs were obtained to evaluate the vascular anatomy. Multidetector CT imaging of the abdomen and pelvis was performed using the standard protocol during bolus administration of intravenous contrast. RADIATION DOSE REDUCTION: This exam was performed according to the departmental dose-optimization program which includes automated exposure control, adjustment of the mA and/or kV according to patient size and/or use of iterative reconstruction technique. CONTRAST:  OMNIPAQUE IOHEXOL 350 MG/ML SOLN COMPARISON:  CT abdomen pelvis dated 07/27/2022. chest CT dated 07/15/2022. FINDINGS: CTA CHEST FINDINGS Cardiovascular: There is no cardiomegaly or pericardial effusion. Three-vessel coronary vascular calcification. Mild atherosclerotic calcification of the thoracic aorta. No aneurysmal dilatation or dissection. The origins of the great vessels of the aortic arch appear patent. Right-sided Port-A-Cath with tip in the right atrium close to the cavoatrial junction. No pulmonary artery embolus identified. Mediastinum/Nodes: No hilar or mediastinal adenopathy. The esophagus and the thyroid gland are grossly unremarkable. No mediastinal fluid collection. Lungs/Pleura: Grossly stable left lower lobe mass in keeping with known malignancy.  Several scattered pulmonary nodules measure up to 9 mm in the right upper lobe similar to prior CT and most consistent with metastasis. No new consolidation. No pleural effusion or pneumothorax. The central airways are patent. Musculoskeletal: Osteopenia with degenerative changes spine. No acute osseous pathology. Review of the MIP images confirms the above findings. CT ABDOMEN and PELVIS FINDINGS No intra-abdominal free air.  Small ascites. Hepatobiliary: The liver is unremarkable. Focal area of hypoenhancement in the left lobe of the liver centrally, likely fatty infiltration. There is mild biliary dilatation. There is a large rim calcified stone in the gallbladder measuring approximately 5.2 cm in diameter. No pericholecystic fluid or evidence of acute cholecystitis by CT. Pancreas: Subcentimeter hypodense lesion in the uncinate process of the pancreas appears similar to prior CT and may represent focal interspersed fat or a side branch IPMN. No active inflammatory changes. No dilatation of the main pancreatic duct or gland atrophy. Spleen: Normal in size without focal abnormality. Adrenals/Urinary Tract: The adrenal glands are unremarkable. There is no hydronephrosis on either side. There is symmetric enhancement and excretion of contrast by both kidneys. The visualized ureters appear unremarkable. The urinary bladder is collapsed. Stomach/Bowel: There is sigmoid diverticulosis and scattered colonic diverticula without active inflammatory changes. There is no bowel obstruction or active inflammation. The appendix is normal. Vascular/Lymphatic: Advanced aortoiliac atherosclerotic disease. The IVC is unremarkable. No portal venous gas. There is no adenopathy. Reproductive: Hysterectomy.  No adnexal masses. Other: None Musculoskeletal: Osteopenia with degenerative changes of the spine. No acute osseous pathology. Review of the MIP images confirms the above findings. IMPRESSION: 1. No acute intrathoracic, abdominal,  or pelvic pathology. No CT evidence of pulmonary artery embolus. 2. Grossly stable left lower lobe mass in keeping with known malignancy. 3. Stable pulmonary nodules most consistent with metastasis. 4. Cholelithiasis. 5. Colonic diverticulosis. No bowel obstruction. Normal appendix. 6.  Aortic Atherosclerosis (ICD10-I70.0). Electronically Signed: By: Elgie Collard M.D. On: 09/04/2022 19:59   CT ABDOMEN PELVIS W CONTRAST  Addendum Date: 09/04/2022   ADDENDUM REPORT: 09/04/2022 21:30 ADDENDUM: There is mild stranding of the upper mesentery and adjacent to the pancreas which may represent acute pancreatitis. Correlation with pancreatic enzymes recommended. Additionally there is a loculated appearing fluid collection measuring approximately 6 x 14 cm along the greater curvature of the stomach most consistent with a pseudocyst. Electronically Signed   By: Elgie Collard M.D.   On: 09/04/2022 21:30  Result Date: 09/04/2022 CLINICAL DATA:  Upper abdominal pain and back pain. History of lung cancer. EXAM: CT ANGIOGRAPHY CHEST CT ABDOMEN AND PELVIS WITH CONTRAST TECHNIQUE: Multidetector CT imaging of the chest was performed using the standard protocol during bolus administration of intravenous contrast. Multiplanar CT image reconstructions and MIPs were obtained to evaluate the vascular anatomy. Multidetector CT imaging of the abdomen and pelvis was performed using the standard protocol during bolus administration of intravenous contrast. RADIATION DOSE REDUCTION: This exam was performed according to the departmental dose-optimization program which includes automated exposure control, adjustment of the mA and/or kV according to patient size and/or use of iterative reconstruction technique. CONTRAST:  OMNIPAQUE IOHEXOL 350 MG/ML SOLN COMPARISON:  CT abdomen pelvis dated 07/27/2022. chest CT dated 07/15/2022. FINDINGS: CTA CHEST FINDINGS Cardiovascular: There is no cardiomegaly or pericardial effusion.  Three-vessel coronary vascular calcification. Mild atherosclerotic calcification of the thoracic aorta. No aneurysmal dilatation or dissection. The origins of the great vessels of the aortic arch appear patent. Right-sided Port-A-Cath with tip in the right atrium close to the cavoatrial junction. No pulmonary artery embolus identified. Mediastinum/Nodes: No hilar or mediastinal adenopathy. The esophagus and the thyroid gland are grossly unremarkable. No mediastinal fluid collection. Lungs/Pleura: Grossly stable left lower lobe mass in keeping with known malignancy. Several scattered pulmonary nodules measure up to 9 mm in the right upper lobe similar to prior CT and most consistent with metastasis. No new consolidation. No pleural effusion or pneumothorax. The central airways are patent. Musculoskeletal: Osteopenia with degenerative changes spine. No acute osseous pathology. Review of the MIP images confirms the above findings. CT ABDOMEN and PELVIS FINDINGS No intra-abdominal free air.  Small ascites. Hepatobiliary: The liver is unremarkable. Focal area of hypoenhancement in the left lobe of the liver centrally, likely fatty infiltration. There is mild biliary dilatation. There is a large rim calcified stone in the gallbladder measuring approximately 5.2 cm in diameter. No pericholecystic fluid or evidence of acute cholecystitis by CT. Pancreas: Subcentimeter hypodense lesion in the uncinate process of the pancreas appears similar to prior CT and may represent focal interspersed fat or a side branch IPMN. No active inflammatory changes. No dilatation of the main pancreatic duct or gland atrophy. Spleen: Normal in size without focal abnormality. Adrenals/Urinary Tract: The adrenal glands are unremarkable. There is no hydronephrosis on either side. There is symmetric enhancement and excretion of contrast by both kidneys. The visualized ureters appear unremarkable. The urinary bladder is collapsed. Stomach/Bowel:  There is sigmoid diverticulosis and scattered colonic diverticula without active inflammatory changes. There is no bowel obstruction or active inflammation. The appendix is normal. Vascular/Lymphatic: Advanced aortoiliac atherosclerotic disease. The IVC is unremarkable. No portal venous gas. There is no adenopathy. Reproductive: Hysterectomy.  No adnexal masses. Other: None Musculoskeletal: Osteopenia with degenerative changes of the spine. No acute osseous pathology. Review of the MIP images confirms the above findings. IMPRESSION: 1. No acute intrathoracic, abdominal, or pelvic pathology. No CT evidence of pulmonary artery embolus. 2. Grossly stable left lower lobe mass in keeping with known malignancy. 3. Stable pulmonary nodules most consistent with metastasis. 4. Cholelithiasis. 5. Colonic diverticulosis. No bowel obstruction. Normal appendix. 6.  Aortic Atherosclerosis (ICD10-I70.0). Electronically Signed: By: Elgie Collard M.D. On: 09/04/2022 19:59   US Abdomen Limited RUQ (LIVER/GB)  Result Date: 09/04/2022 CLINICAL DATA:  Pancreatitis. EXAM: ULTRASOUND ABDOMEN LIMITED RIGHT UPPER QUADRANT COMPARISON:  CT abdomen pelvis dated 09/04/2022. FINDINGS: Gallbladder: There is a large stone in the gallbladder, better seen on the CT. No  gallbladder wall thickening or pericholecystic fluid. Negative sonographic Murphy's sign. Common bile duct: Diameter: 5 mm Liver: There is diffuse increased liver echogenicity most commonly seen in the setting of fatty infiltration. Superimposed inflammation or fibrosis is not excluded. Clinical correlation is recommended. Portal vein is patent on color Doppler imaging with normal direction of blood flow towards the liver. Other: Small ascites. IMPRESSION: 1. Cholelithiasis without sonographic evidence of acute cholecystitis. 2. Fatty liver. 3. Small ascites. Electronically Signed   By: Elgie Collard M.D.   On: 09/04/2022 21:25      Subjective: Since this morning,  patient has tolerated 80-100% of meals.  Has only used a dose of oxycodone early this morning.  Did use 2 doses of IV Dilaudid yesterday.  No nausea or vomiting.  Feels comfortable discharging home  Discharge Exam:  Vitals:   09/11/22 1430 09/11/22 2128 09/12/22 0531 09/12/22 1424  BP: 135/73 127/65 (!) 150/79 135/63  Pulse: 60 64 (!) 55 (!) 55  Resp: 17 18 18 17   Temp: 97.9 F (36.6 C) 97.8 F (36.6 C) 98.7 F (37.1 C) 98.1 F (36.7 C)  TempSrc:  Oral    SpO2: 96% 98% 97% 95%  Weight:      Height:        General exam: Middle-age female, moderately built and nourished sitting up comfortably at edge of bed this morning. Respiratory system: Clear to auscultation. Respiratory effort normal.  Right upper anterior chest Port-A-Cath. Cardiovascular system: S1 & S2 heard, RRR. No JVD, murmurs, rubs, gallops or clicks. No pedal edema. Gastrointestinal system: Abdomen is nondistended, soft and nontender. No organomegaly or masses felt. Normal bowel sounds heard. Central nervous system: Alert and oriented. No focal neurological deficits. Extremities: Symmetric 5 x 5 power. Skin: No rashes, lesions or ulcers Psychiatry: Judgement and insight appear normal. Mood & affect appropriate.     The results of significant diagnostics from this hospitalization (including imaging, microbiology, ancillary and laboratory) are listed below for reference.     Microbiology: Recent Results (from the past 240 hour(s))  Urine Culture (for pregnant, neutropenic or urologic patients or patients with an indwelling urinary catheter)     Status: Abnormal   Collection Time: 09/04/22  4:31 PM   Specimen: Urine, Clean Catch  Result Value Ref Range Status   Specimen Description   Final    URINE, CLEAN CATCH Performed at [facility], [address] [place], [place] [address]    Special Requests   Final    NONE Performed at [facility], [address], [place],  [place] [address]    Culture >=100,000 COLONIES/mL ESCHERICHIA COLI (A)  Final   Report Status 09/06/2022 FINAL  Final   Organism ID, Bacteria ESCHERICHIA COLI (A)  Final      Susceptibility   Escherichia coli - MIC*    AMPICILLIN >=32 RESISTANT Resistant     CEFAZOLIN <=4 SENSITIVE Sensitive     CEFEPIME <=0.12 SENSITIVE Sensitive     CEFTRIAXONE <=0.25 SENSITIVE Sensitive     CIPROFLOXACIN <=0.25 SENSITIVE Sensitive     GENTAMICIN <=1 SENSITIVE Sensitive     IMIPENEM <=0.25 SENSITIVE Sensitive     NITROFURANTOIN <=16 SENSITIVE Sensitive     TRIMETH/SULFA <=20 SENSITIVE Sensitive     AMPICILLIN/SULBACTAM 16 INTERMEDIATE Intermediate     PIP/TAZO <=4 SENSITIVE Sensitive     * >=100,000 COLONIES/mL ESCHERICHIA COLI     Labs: CBC: Recent Labs  Lab 09/06/22 0340 09/07/22 0304 09/08/22 0253 09/11/22 0440  09/12/22 0305  WBC 8.1 5.7 4.9 2.7* 3.7*  NEUTROABS 6.7 4.9 3.8  --  1.7  HGB 10.2* 9.7* 9.6* 9.4* 9.7*  HCT 32.4* 30.6* 29.8* 29.6* 30.6*  MCV 100.6* 99.4 98.7 96.4 97.1  PLT 170 164 156 102* 114*    Basic Metabolic Panel: Recent Labs  Lab 09/08/22 0253 09/09/22 0307 09/10/22 0333 09/11/22 0440 09/12/22 0305  NA 139 139 142 138 138  K 3.8 3.9 3.9 3.7 3.6  CL 104 102 103 102 102  CO2 28 28 29 28 27   GLUCOSE 147* 223* 154* 270* 217*  BUN 7* 9 13 14 19   CREATININE 0.47 0.62 0.58 0.62 0.77  CALCIUM 8.5* 8.4* 8.9 8.6* 8.6*    Liver Function Tests: Recent Labs  Lab 09/08/22 0253 09/09/22 0307 09/10/22 0333 09/11/22 0440 09/12/22 0305  AST 79* 101* 111* 74* 69*  ALT 108* 166* 207* 178* 180*  ALKPHOS 53 55 61 58 62  BILITOT 0.9 0.7 0.8 0.6 0.6  PROT 5.3* 5.2* 5.6* 5.3* 5.4*  ALBUMIN 2.4* 2.3* 2.6* 2.5* 2.6*    CBG: Recent Labs  Lab 09/11/22 1638 09/11/22 2050 09/12/22 0844 09/12/22 1148 09/12/22 1627  GLUCAP 272* 316* 143* 182* 269*     Urinalysis    Component Value Date/Time   COLORURINE YELLOW 09/04/2022 1631   APPEARANCEUR HAZY (A) 09/04/2022  1631   LABSPEC 1.026 09/04/2022 1631   PHURINE 5.0 09/04/2022 1631   GLUCOSEU >=500 (A) 09/04/2022 1631   HGBUR NEGATIVE 09/04/2022 1631   BILIRUBINUR NEGATIVE 09/04/2022 1631   KETONESUR NEGATIVE 09/04/2022 1631   PROTEINUR NEGATIVE 09/04/2022 1631   UROBILINOGEN 0.2 04/07/2011 2356   NITRITE POSITIVE (A) 09/04/2022 1631   LEUKOCYTESUR SMALL (A) 09/04/2022 1631      Time coordinating discharge: 35 minutes  SIGNED:  Marcellus Scott, MD,  FACP, FHM, SFHM, [facility], [facility]   [facility] Ad[facility] [place]     To contact the attending provider between 7A-7P or the covering provider during after hours 7P-7A, please log into the web site [email or web address] and access using universal [place] password for that web site. If you do not have the password, please call the hospital operator.

## 2022-09-12 NOTE — Consult Note (Signed)
   [facility] Inpatient Consult   09/12/2022  SHAHIDA SCHNACKENBERG [birth year] [id number]  [facility] [[facility]]  Accountable Care Organization [ACO] Patient: Valinda Hoar [place]  *[facility] Liaison remote coverage review for patient admitted to [facility]    Primary Care Provider:  [name], FNP [facility]   Patient screened for 3 hospitalizations in the past 6 months and 5 ED visits in 6 months with noted extreme high risk score for unplanned readmission risk 7 day length of stay and to assess for potential [facility]  [[facility]] Care Management service needs for post hospital transition for care coordination.  Review of patient's electronic medical record reveals patient is followed by [facility] Oncology team   Plan:  Continue to follow progress and disposition to assess for post hospital community care coordination/management needs.  Referral request for community care coordination:  currently needs could be covered by [facility] team follow up.  Of note, [facility] Care Management/Population Health does not replace or interfere with any arrangements made by the Inpatient Transition of Care team.  For questions contact:   Charlesetta Shanks, RN BSN CCM [facility]  [phone] business mobile phone Toll free office [phone]  *Concierge Line  [phone] Fax number: [phone] [email or web address]

## 2022-09-13 ENCOUNTER — Telehealth: Payer: Self-pay

## 2022-09-13 ENCOUNTER — Other Ambulatory Visit (HOSPITAL_COMMUNITY): Payer: Self-pay

## 2022-09-13 NOTE — Transitions of Care (Post Inpatient/ED Visit) (Signed)
   09/13/2022  Name: Carolyn Hooper MRN: [id number] DOB: Jul 19, 1960  Today's TOC FU Call Status: Today's TOC FU Call Status:: Unsuccessul Call (1st Attempt) Unsuccessful Call (1st Attempt) Date: 09/13/22  Attempted to reach the patient regarding the most recent Inpatient/ED visit.  Follow Up Plan: Additional outreach attempts will be made to reach the patient to complete the Transitions of Care (Post Inpatient/ED visit) call.   Jodelle Gross, RN, BSN, CCM Care Management Coordinator [place]/[facility]

## 2022-09-13 NOTE — Telephone Encounter (Signed)
This nurse received a call from this patient stating that she has been discharged from the hospital and she needs a follow up appointment with provider.  She also needed clarification on some of her medications that she was discharged home with.  This nurse explained the medications, uses and instructions.  Patient acknowledged understanding but knows to call clinic if she has more questions or concerns.

## 2022-09-16 ENCOUNTER — Inpatient Hospital Stay: Payer: BC Managed Care – PPO | Attending: Physician Assistant

## 2022-09-16 ENCOUNTER — Telehealth: Payer: Self-pay | Admitting: Internal Medicine

## 2022-09-16 ENCOUNTER — Telehealth: Payer: Self-pay | Admitting: Family

## 2022-09-16 ENCOUNTER — Other Ambulatory Visit: Payer: Self-pay | Admitting: Medical Oncology

## 2022-09-16 ENCOUNTER — Other Ambulatory Visit: Payer: Self-pay

## 2022-09-16 ENCOUNTER — Other Ambulatory Visit (HOSPITAL_COMMUNITY): Payer: Self-pay

## 2022-09-16 ENCOUNTER — Telehealth: Payer: Self-pay

## 2022-09-16 ENCOUNTER — Other Ambulatory Visit: Payer: Self-pay | Admitting: Physician Assistant

## 2022-09-16 ENCOUNTER — Telehealth: Payer: Self-pay | Admitting: Medical Oncology

## 2022-09-16 ENCOUNTER — Other Ambulatory Visit: Payer: Self-pay | Admitting: Family Medicine

## 2022-09-16 DIAGNOSIS — K858 Other acute pancreatitis without necrosis or infection: Secondary | ICD-10-CM | POA: Diagnosis not present

## 2022-09-16 DIAGNOSIS — Z95828 Presence of other vascular implants and grafts: Secondary | ICD-10-CM

## 2022-09-16 DIAGNOSIS — C3432 Malignant neoplasm of lower lobe, left bronchus or lung: Secondary | ICD-10-CM | POA: Diagnosis not present

## 2022-09-16 DIAGNOSIS — C3490 Malignant neoplasm of unspecified part of unspecified bronchus or lung: Secondary | ICD-10-CM

## 2022-09-16 DIAGNOSIS — E1169 Type 2 diabetes mellitus with other specified complication: Secondary | ICD-10-CM

## 2022-09-16 DIAGNOSIS — R918 Other nonspecific abnormal finding of lung field: Secondary | ICD-10-CM

## 2022-09-16 LAB — CBC WITH DIFFERENTIAL (CANCER CENTER ONLY)
Abs Immature Granulocytes: 0.16 10*3/uL — ABNORMAL HIGH (ref 0.00–0.07)
Basophils Absolute: 0 10*3/uL (ref 0.0–0.1)
Basophils Relative: 0 %
Eosinophils Absolute: 0 10*3/uL (ref 0.0–0.5)
Eosinophils Relative: 0 %
HCT: 35.3 % — ABNORMAL LOW (ref 36.0–46.0)
Hemoglobin: 11.4 g/dL — ABNORMAL LOW (ref 12.0–15.0)
Immature Granulocytes: 1 %
Lymphocytes Relative: 6 %
Lymphs Abs: 0.7 10*3/uL (ref 0.7–4.0)
MCH: 31.8 pg (ref 26.0–34.0)
MCHC: 32.3 g/dL (ref 30.0–36.0)
MCV: 98.6 fL (ref 80.0–100.0)
Monocytes Absolute: 0.5 10*3/uL (ref 0.1–1.0)
Monocytes Relative: 5 %
Neutro Abs: 10.5 10*3/uL — ABNORMAL HIGH (ref 1.7–7.7)
Neutrophils Relative %: 88 %
Platelet Count: 119 10*3/uL — ABNORMAL LOW (ref 150–400)
RBC: 3.58 MIL/uL — ABNORMAL LOW (ref 3.87–5.11)
RDW: 18.2 % — ABNORMAL HIGH (ref 11.5–15.5)
WBC Count: 11.9 10*3/uL — ABNORMAL HIGH (ref 4.0–10.5)
nRBC: 0.3 % — ABNORMAL HIGH (ref 0.0–0.2)

## 2022-09-16 LAB — CMP (CANCER CENTER ONLY)
ALT: 68 U/L — ABNORMAL HIGH (ref 0–44)
AST: 18 U/L (ref 15–41)
Albumin: 3.3 g/dL — ABNORMAL LOW (ref 3.5–5.0)
Alkaline Phosphatase: 70 U/L (ref 38–126)
Anion gap: 11 (ref 5–15)
BUN: 22 mg/dL (ref 8–23)
CO2: 27 mmol/L (ref 22–32)
Calcium: 8.8 mg/dL — ABNORMAL LOW (ref 8.9–10.3)
Chloride: 102 mmol/L (ref 98–111)
Creatinine: 0.81 mg/dL (ref 0.44–1.00)
GFR, Estimated: >60 mL/min (ref >60)
Glucose, Bld: 231 mg/dL — ABNORMAL HIGH (ref 70–99)
Potassium: 4.1 mmol/L (ref 3.5–5.1)
Sodium: 140 mmol/L (ref 135–145)
Total Bilirubin: 0.6 mg/dL (ref 0.3–1.2)
Total Protein: 6.2 g/dL — ABNORMAL LOW (ref 6.5–8.1)

## 2022-09-16 LAB — LIPASE, BLOOD: Lipase: 218 U/L — ABNORMAL HIGH (ref 11–51)

## 2022-09-16 MED ORDER — HEPARIN SOD (PORK) LOCK FLUSH 100 UNIT/ML IV SOLN
500.0000 [IU] | Freq: Once | INTRAVENOUS | Status: AC
  Administered 2022-09-16: 500 [IU]

## 2022-09-16 MED ORDER — METFORMIN HCL 1000 MG PO TABS
1000.0000 mg | ORAL_TABLET | Freq: Two times a day (BID) | ORAL | 1 refills | Status: DC
Start: 2022-09-16 — End: 2022-11-27
  Filled 2022-09-16 – 2022-10-02 (×2): qty 180, 90d supply, fill #0

## 2022-09-16 MED ORDER — SODIUM CHLORIDE 0.9% FLUSH
10.0000 mL | Freq: Once | INTRAVENOUS | Status: AC
  Administered 2022-09-16: 10 mL

## 2022-09-16 NOTE — Transitions of Care (Post Inpatient/ED Visit) (Signed)
   09/16/2022  Name: Carolyn Hooper MRN: [id number] DOB: [birth year]  Today's TOC FU Call Status: Today's TOC FU Call Status:: Unsuccessful Call (2nd Attempt) Unsuccessful Call (2nd Attempt) Date: 09/16/22  Attempted to reach the patient regarding the most recent Inpatient/ED visit.  Follow Up Plan: Additional outreach attempts will be made to reach the patient to complete the Transitions of Care (Post Inpatient/ED visit) call.   Jodelle Gross, RN, BSN, CCM Care Management Coordinator [place]/[facility]

## 2022-09-16 NOTE — Telephone Encounter (Signed)
Patient aware and verbalized understanding. °

## 2022-09-16 NOTE — Telephone Encounter (Signed)
Fall-She fell at home on Saturday . " My legs gave out. They started shaking and I could not get control and I fell". She said it may be her vertigo that caused it.  Speech slow ,  but clear and  appropriate. Minor word searching. Emotional about all her medical issues. Support given.   Appointment  for labs today.  07/08- CT C/A/P-  08/22- MRI brain  F/U-Message sent to K. Hawks to f/u with pt 1 week.

## 2022-09-16 NOTE — Telephone Encounter (Signed)
Ok to wait for another 3-4 months. Continue to follow up with Oncologists and keep sleep study appointment.   Jannifer Rodney, FNP

## 2022-09-16 NOTE — Progress Notes (Signed)
Orders entered

## 2022-09-16 NOTE — Telephone Encounter (Signed)
Called patient twice and also called patient contacts, was unable to connect with either regarding appointment information. Unable to leave message due to voicemail box being full.

## 2022-09-17 ENCOUNTER — Other Ambulatory Visit: Payer: Self-pay | Admitting: Medical Oncology

## 2022-09-17 ENCOUNTER — Telehealth: Payer: Self-pay | Admitting: Medical Oncology

## 2022-09-17 ENCOUNTER — Other Ambulatory Visit (HOSPITAL_COMMUNITY): Payer: Self-pay | Admitting: Emergency Medicine

## 2022-09-17 ENCOUNTER — Encounter: Payer: Self-pay | Admitting: Internal Medicine

## 2022-09-17 ENCOUNTER — Telehealth: Payer: Self-pay

## 2022-09-17 ENCOUNTER — Ambulatory Visit: Payer: BC Managed Care – PPO | Admitting: Physical Therapy

## 2022-09-17 ENCOUNTER — Other Ambulatory Visit (HOSPITAL_COMMUNITY): Payer: Self-pay

## 2022-09-17 DIAGNOSIS — R918 Other nonspecific abnormal finding of lung field: Secondary | ICD-10-CM

## 2022-09-17 NOTE — Transitions of Care (Post Inpatient/ED Visit) (Signed)
09/17/2022  Name: Carolyn Hooper MRN: [id number] DOB: Aug 08, 1960  Today's TOC FU Call Status: Today's TOC FU Call Status:: Successful TOC FU Call Competed TOC FU Call Complete Date: 09/17/22  Transition Care Management Follow-up Telephone Call Date of Discharge: 09/12/22 Discharge Facility: [facility]) Type of Discharge: Inpatient Admission Primary Inpatient Discharge Diagnosis:: Acute Pancreatitis How have you been since you were released from the hospital?: Better (Patient noted she is doing as best as can be expected) Any questions or concerns?: No  Items Reviewed: Did you receive and understand the discharge instructions provided?: Yes Medications obtained,verified, and reconciled?: Partial Review Completed Medications Not Reviewed Reasons:: Other: (Patient did not want to have long conversation) Any new allergies since your discharge?: No Dietary orders reviewed?: No Do you have support at home?: Yes People in Home: sibling(s) Name of Support/Comfort Primary Source: Natalie  Medications Reviewed Today: Medications Reviewed Today     Reviewed by Jodelle Gross, RN (Case Manager) on 09/17/22 at [phone]  Med List Status: <None>   Medication Order Taking? Sig Documenting Provider Last Dose Status Informant  albuterol (VENTOLIN HFA) 108 (90 Base) MCG/ACT inhaler [id number]  Inhale 2 puffs into the lungs every 6 (six) hours as needed.  Patient not taking: Reported on 09/05/2022   [name], FNP  Active Self  clonazePAM (KLONOPIN) 0.5 MG tablet [id number]  Take 1 tablet (0.5 mg total) by mouth 2 (two) times daily.  Patient taking differently: Take 0.5 mg by mouth 2 (two) times daily as needed for anxiety.   [name], FNP  Active Self  Ensure Max Protein (ENSURE MAX PROTEIN) LIQD [id number]  Take 330 mLs (11 oz total) by mouth 2 (two) times daily. Elease Etienne, MD  Active   famotidine (PEPCID) 20 MG tablet [id number]  Take 1 tablet (20 mg total) by mouth 2 (two)  times daily. [name], MD  Active Self  fluconazole (DIFLUCAN) 150 MG tablet [id number]  Take 1 tablet by mouth every 3 days as needed (yeast infection). [name], FNP  Active Self  folic acid (FOLVITE) 1 MG tablet [id number]  Take 1 tablet by mouth once daily Heilingoetter, [name], PA-C  Active   furosemide (LASIX) 20 MG tablet [id number]  Take 20 mg by mouth daily as needed for fluid or edema. [provider]  Active Self  insulin glargine (LANTUS SOLOSTAR) 100 UNIT/ML Solostar Pen [id number]  Inject 15 Units into the skin daily. Elease Etienne, MD  Active   Insulin Pen Needle (PEN NEEDLES) 32G X 4 MM MISC [id number]  Use to inject insulin daily as directed. [name], FNP  Active Self  levETIRAcetam (KEPPRA) 1000 MG tablet [id number]  Take 1 tablet (1,000 mg total) by mouth 2 (two) times daily. [name], MD  Active Self  lidocaine (XYLOCAINE) 5 % ointment [id number]  Apply topically as needed.  Patient taking differently: Apply 1 Application topically as needed for mild pain.   [name], FNP  Active Self  lidocaine-prilocaine (EMLA) cream [id number]  Apply to port prior to access [name], MD  Active Self  lipase/protease/amylase (CREON) 36000 UNITS CPEP capsule [id number]  Take 1 capsule (36,000 Units total) by mouth 3 (three) times daily with meals for 5 days. Elease Etienne, MD  Active   metFORMIN (GLUCOPHAGE) 1000 MG tablet [id number]  Take 1 tablet by mouth 2 times daily. [name], FNP  Active   [facility] MAXIMUM STRENGTH 400-400-40 MG/5ML suspension [id number]  Take 15 mLs by mouth See admin instructions. Take 15 ml's by mouth midday and bedtime [provider]  Active Self  ondansetron (ZOFRAN) 4 MG tablet [id number]  Take 1 tablet (4 mg total) by mouth every 6 (six) hours as needed for nausea.  Patient taking differently: Take 4-8 mg by mouth every 6 (six) hours as needed for nausea.   Alba Cory, MD  Active  Self  oxyCODONE (OXY IR/ROXICODONE) 5 MG immediate release tablet [id number] Yes Take 1 tablet (5 mg total) by mouth every 8 (eight) hours as needed for moderate pain or severe pain. Elease Etienne, MD Taking Active   pantoprazole (PROTONIX) 40 MG tablet [id number] Yes Take 1 tablet (40 mg total) by mouth daily. Elease Etienne, MD Taking Active   predniSONE (DELTASONE) 20 MG tablet [id number] Yes Take 5 tablets daily x 5 days THEN take 4 tablets daily x 7 days THEN 3 tablets daily x 7 days THEN take 2 tablets daily x 7 days THEN take 1 tablet daily x 7 days THEN take 1/2 tablet daily x 7 days.  Follow-up with oncology MD regarding need for change in regimen. Elease Etienne, MD Taking Active   prochlorperazine (COMPAZINE) 10 MG tablet [id number]  Take 1 tablet (10 mg total) by mouth every 6 (six) hours as needed.  Patient taking differently: Take 10 mg by mouth every 6 (six) hours as needed for nausea or vomiting.   Heilingoetter, [name], PA-C  Active Self  triamcinolone ointment (KENALOG) 0.5 % [id number]  Apply topically to affected area twice daily as directed, Laural Benes, Clanford L, MD  Active Self  TYLENOL 500 MG tablet [id number]  Take 1,000 mg by mouth every 6 (six) hours as needed for mild pain or headache. [provider]  Active Self            Home Care and Equipment/Supplies: Were Home Health Services Ordered?: No Any new equipment or medical supplies ordered?: No  Functional Questionnaire: Do you need assistance with bathing/showering or dressing?: No Do you need assistance with meal preparation?: No Do you need assistance with eating?: No Do you have difficulty maintaining continence: No Do you need assistance with getting out of bed/getting out of a chair/moving?: No Do you have difficulty managing or taking your medications?: No  Follow up appointments reviewed: PCP Follow-up appointment confirmed?: NA (Patients NP told patient she did not need to be seen  for 3 months due to current cancer treatment.) Specialist Hospital Follow-up appointment confirmed?: Yes Date of Specialist follow-up appointment?: 09/24/22 Follow-Up Specialty Provider:: Dr. Theodis Blaze Do you need transportation to your follow-up appointment?: No Do you understand care options if your condition(s) worsen?: Yes-patient verbalized understanding  SDOH Interventions Today    Flowsheet Row Most Recent Value  SDOH Interventions   Food Insecurity Interventions Intervention Not Indicated  Transportation Interventions Intervention Not Indicated     Jodelle Gross, RN, BSN, CCM Care Management Coordinator [facility]

## 2022-09-17 NOTE — Telephone Encounter (Signed)
Message left with Dr Ewing Schlein re creon refill. They will call pt.

## 2022-09-18 ENCOUNTER — Other Ambulatory Visit (HOSPITAL_COMMUNITY): Payer: Self-pay

## 2022-09-18 ENCOUNTER — Telehealth: Payer: Self-pay | Admitting: Medical Oncology

## 2022-09-18 ENCOUNTER — Inpatient Hospital Stay: Payer: BC Managed Care – PPO

## 2022-09-18 ENCOUNTER — Other Ambulatory Visit: Payer: BC Managed Care – PPO

## 2022-09-18 ENCOUNTER — Ambulatory Visit: Payer: BC Managed Care – PPO | Admitting: Family Medicine

## 2022-09-18 NOTE — Telephone Encounter (Signed)
Creon refilled by Dr. Ewing Schlein.   Vaginal irritation-She is seeing provider today at [facility] .

## 2022-09-20 ENCOUNTER — Ambulatory Visit (INDEPENDENT_AMBULATORY_CARE_PROVIDER_SITE_OTHER): Payer: BC Managed Care – PPO | Admitting: Family

## 2022-09-20 ENCOUNTER — Encounter: Payer: Self-pay | Admitting: Family

## 2022-09-20 ENCOUNTER — Other Ambulatory Visit: Payer: Self-pay

## 2022-09-20 VITALS — BP 172/98 | HR 71 | Temp 97.3°F | Ht 68.0 in | Wt 221.6 lb

## 2022-09-20 DIAGNOSIS — R102 Pelvic and perineal pain: Secondary | ICD-10-CM | POA: Diagnosis not present

## 2022-09-20 DIAGNOSIS — N898 Other specified noninflammatory disorders of vagina: Secondary | ICD-10-CM

## 2022-09-20 DIAGNOSIS — C7931 Secondary malignant neoplasm of brain: Secondary | ICD-10-CM

## 2022-09-20 DIAGNOSIS — C3490 Malignant neoplasm of unspecified part of unspecified bronchus or lung: Secondary | ICD-10-CM | POA: Diagnosis not present

## 2022-09-20 DIAGNOSIS — E1165 Type 2 diabetes mellitus with hyperglycemia: Secondary | ICD-10-CM | POA: Diagnosis not present

## 2022-09-20 DIAGNOSIS — L732 Hidradenitis suppurativa: Secondary | ICD-10-CM

## 2022-09-20 LAB — WET PREP FOR TRICH, YEAST, CLUE
Clue Cell Exam: NEGATIVE
Trichomonas Exam: NEGATIVE
Yeast Exam: NEGATIVE

## 2022-09-20 MED ORDER — OXYCODONE HCL 5 MG PO CAPS
5.0000 mg | ORAL_CAPSULE | Freq: Four times a day (QID) | ORAL | 0 refills | Status: DC | PRN
Start: 2022-11-14 — End: 2022-10-08

## 2022-09-20 MED ORDER — OXYCODONE HCL 5 MG PO CAPS
5.0000 mg | ORAL_CAPSULE | Freq: Four times a day (QID) | ORAL | 0 refills | Status: DC | PRN
Start: 2022-10-18 — End: 2022-10-08

## 2022-09-20 MED ORDER — FUROSEMIDE 20 MG PO TABS: 20.0000 mg | ORAL_TABLET | Freq: Every day | ORAL | 1 refills | Status: DC | PRN

## 2022-09-20 MED ORDER — OXYCODONE HCL 5 MG PO TABS
5.0000 mg | ORAL_TABLET | Freq: Three times a day (TID) | ORAL | 0 refills | Status: DC | PRN
Start: 2022-09-20 — End: 2022-10-30

## 2022-09-20 NOTE — Progress Notes (Signed)
Subjective:    Patient ID: Carolyn Hooper, female    DOB: 1961-01-14, 62 y.o.   MRN: [id number]  Chief Complaint  Patient presents with   Cyst    On private area    Pt presents to the office today with labia swelling, pain that started 4 days. However, this has been recurrent. She has been using OTC monistat and diflucan with mild relief.   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 She takes oral steroids.   She uncontrolled DM and hx of hidradenitis suppurativa. Her last A1C on 09/04/22 was 8.4.  Vaginal Pain The patient's pertinent negatives include no genital itching. This is a recurrent problem. The current episode started more than 1 month ago. The problem has been waxing and waning. The pain is moderate. The treatment provided mild relief.      Review of Systems  Genitourinary:  Positive for vaginal pain.  All other systems reviewed and are negative.      Objective:   Physical Exam Vitals reviewed.  Constitutional:      General: She is not in acute distress.    Appearance: She is well-developed. She is obese.  HENT:     Head: Normocephalic and atraumatic.  Eyes:     Pupils: Pupils are equal, round, and reactive to light.  Neck:     Thyroid: No thyromegaly.  Cardiovascular:     Rate and Rhythm: Normal rate and regular rhythm.     Heart sounds: Normal heart sounds. No murmur heard. Pulmonary:     Effort: Pulmonary effort is normal. No respiratory distress.     Breath sounds: Normal breath sounds. No wheezing.  Abdominal:     General: Bowel sounds are normal. There is no distension.     Palpations: Abdomen is soft.     Tenderness: There is no abdominal tenderness.  Genitourinary:    Comments: Ulcer on right labia, mild swelling and tenderness of labia Musculoskeletal:        General: No tenderness. Normal range of motion.     Cervical back: Normal range of motion and neck supple.  Skin:    General: Skin is warm and dry.  Neurological:      Mental Status: She is alert and oriented to person, place, and time.     Cranial Nerves: No cranial nerve deficit.     Deep Tendon Reflexes: Reflexes are normal and symmetric.  Psychiatric:        Behavior: Behavior normal.        Thought Content: Thought content normal.        Judgment: Judgment normal.      BP (!) 172/98   Pulse 71   Temp (!) 97.3 F (36.3 C) (Temporal)   Ht 5\' 8"  (1.727 m)   Wt 221 lb 9.6 oz (100.5 kg)   SpO2 98%   BMI 33.69 kg/m       Assessment & Plan:  Carolyn Hooper comes in today with chief complaint of Cyst (On private area )   Diagnosis and orders addressed:  1. Non-small cell lung cancer, unspecified laterality (HCC) - oxyCODONE (OXY IR/ROXICODONE) 5 MG immediate release tablet; Take 1 tablet (5 mg total) by mouth every 8 (eight) hours as needed for moderate pain or severe pain.  Dispense: 60 tablet; Refill: 0 - oxycodone (OXY-IR) 5 MG capsule; Take 1 capsule (5 mg total) by mouth every 6 (six) hours as needed.  Dispense: 60 capsule; Refill: 0 - oxycodone (  OXY-IR) 5 MG capsule; Take 1 capsule (5 mg total) by mouth every 6 (six) hours as needed.  Dispense: 60 capsule; Refill: 0  2. 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 Hidradenitis suppurativa - oxyCODONE (OXY IR/ROXICODONE) 5 MG immediate release tablet; Take 1 tablet (5 mg total) by mouth every 8 (eight) hours as needed for moderate pain or severe pain.  Dispense: 60 tablet; Refill: 0 - oxycodone (OXY-IR) 5 MG capsule; Take 1 capsule (5 mg total) by mouth every 6 (six) hours as needed.  Dispense: 60 capsule; Refill: 0 - oxycodone (OXY-IR) 5 MG capsule; Take 1 capsule (5 mg total) by mouth every 6 (six) hours as needed.  Dispense: 60 capsule; Refill: 0  4. Uncontrolled type 2 diabetes mellitus with hyperglycemia, without long-term current use of insulin (HCC)  5. Vaginal pain - WET PREP FOR TRICH, YEAST, CLUE - Herpes simplex virus culture - oxyCODONE (OXY IR/ROXICODONE) 5  MG immediate release tablet; Take 1 tablet (5 mg total) by mouth every 8 (eight) hours as needed for moderate pain or severe pain.  Dispense: 60 tablet; Refill: 0 - oxycodone (OXY-IR) 5 MG capsule; Take 1 capsule (5 mg total) by mouth every 6 (six) hours as needed.  Dispense: 60 capsule; Refill: 0 - oxycodone (OXY-IR) 5 MG capsule; Take 1 capsule (5 mg total) by mouth every 6 (six) hours as needed.  Dispense: 60 capsule; Refill: 0  6. Vaginal lesion - WET PREP FOR TRICH, YEAST, CLUE - Herpes simplex virus culture  Will give 3 months of oxycodone Patient reviewed in [place] controlled database, no flags noted. Contract and drug screen are up to date.  Labs pending      Jannifer Rodney, FNP

## 2022-09-20 NOTE — Patient Instructions (Signed)

## 2022-09-21 ENCOUNTER — Other Ambulatory Visit: Payer: Self-pay

## 2022-09-21 ENCOUNTER — Emergency Department (HOSPITAL_COMMUNITY): Payer: BC Managed Care – PPO

## 2022-09-21 ENCOUNTER — Inpatient Hospital Stay (HOSPITAL_COMMUNITY)
Admission: EM | Admit: 2022-09-21 | Discharge: 2022-09-30 | DRG: 439 | Disposition: A | Discharge status: Home Health | Payer: BC Managed Care – PPO | Attending: Internal Medicine | Admitting: Internal Medicine

## 2022-09-21 ENCOUNTER — Encounter (HOSPITAL_COMMUNITY): Payer: Self-pay

## 2022-09-21 DIAGNOSIS — K625 Hemorrhage of anus and rectum: Secondary | ICD-10-CM | POA: Diagnosis not present

## 2022-09-21 DIAGNOSIS — R030 Elevated blood-pressure reading, without diagnosis of hypertension: Secondary | ICD-10-CM

## 2022-09-21 DIAGNOSIS — Z888 Allergy status to other drugs, medicaments and biological substances status: Secondary | ICD-10-CM | POA: Diagnosis not present

## 2022-09-21 DIAGNOSIS — K869 Disease of pancreas, unspecified: Secondary | ICD-10-CM | POA: Diagnosis not present

## 2022-09-21 DIAGNOSIS — Z8249 Family history of ischemic heart disease and other diseases of the circulatory system: Secondary | ICD-10-CM

## 2022-09-21 DIAGNOSIS — J984 Other disorders of lung: Secondary | ICD-10-CM | POA: Diagnosis not present

## 2022-09-21 DIAGNOSIS — Z87891 Personal history of nicotine dependence: Secondary | ICD-10-CM | POA: Diagnosis not present

## 2022-09-21 DIAGNOSIS — Z79899 Other long term (current) drug therapy: Secondary | ICD-10-CM | POA: Diagnosis not present

## 2022-09-21 DIAGNOSIS — I10 Essential (primary) hypertension: Secondary | ICD-10-CM | POA: Diagnosis present

## 2022-09-21 DIAGNOSIS — K219 Gastro-esophageal reflux disease without esophagitis: Secondary | ICD-10-CM | POA: Diagnosis not present

## 2022-09-21 DIAGNOSIS — R1030 Lower abdominal pain, unspecified: Secondary | ICD-10-CM | POA: Diagnosis not present

## 2022-09-21 DIAGNOSIS — R1012 Left upper quadrant pain: Secondary | ICD-10-CM | POA: Diagnosis not present

## 2022-09-21 DIAGNOSIS — K862 Cyst of pancreas: Secondary | ICD-10-CM | POA: Diagnosis not present

## 2022-09-21 DIAGNOSIS — Z794 Long term (current) use of insulin: Secondary | ICD-10-CM | POA: Diagnosis not present

## 2022-09-21 DIAGNOSIS — E785 Hyperlipidemia, unspecified: Secondary | ICD-10-CM | POA: Diagnosis not present

## 2022-09-21 DIAGNOSIS — B9689 Other specified bacterial agents as the cause of diseases classified elsewhere: Secondary | ICD-10-CM | POA: Diagnosis not present

## 2022-09-21 DIAGNOSIS — Z8261 Family history of arthritis: Secondary | ICD-10-CM

## 2022-09-21 DIAGNOSIS — R1084 Generalized abdominal pain: Secondary | ICD-10-CM | POA: Diagnosis not present

## 2022-09-21 DIAGNOSIS — J322 Chronic ethmoidal sinusitis: Secondary | ICD-10-CM | POA: Diagnosis not present

## 2022-09-21 DIAGNOSIS — E1169 Type 2 diabetes mellitus with other specified complication: Secondary | ICD-10-CM | POA: Diagnosis not present

## 2022-09-21 DIAGNOSIS — R109 Unspecified abdominal pain: Principal | ICD-10-CM | POA: Diagnosis present

## 2022-09-21 DIAGNOSIS — R52 Pain, unspecified: Secondary | ICD-10-CM | POA: Diagnosis present

## 2022-09-21 DIAGNOSIS — R1013 Epigastric pain: Secondary | ICD-10-CM

## 2022-09-21 DIAGNOSIS — K863 Pseudocyst of pancreas: Principal | ICD-10-CM | POA: Diagnosis present

## 2022-09-21 DIAGNOSIS — C3490 Malignant neoplasm of unspecified part of unspecified bronchus or lung: Secondary | ICD-10-CM | POA: Diagnosis not present

## 2022-09-21 DIAGNOSIS — R932 Abnormal findings on diagnostic imaging of liver and biliary tract: Secondary | ICD-10-CM | POA: Diagnosis not present

## 2022-09-21 DIAGNOSIS — E119 Type 2 diabetes mellitus without complications: Secondary | ICD-10-CM

## 2022-09-21 DIAGNOSIS — K449 Diaphragmatic hernia without obstruction or gangrene: Secondary | ICD-10-CM | POA: Diagnosis present

## 2022-09-21 DIAGNOSIS — K2970 Gastritis, unspecified, without bleeding: Secondary | ICD-10-CM | POA: Diagnosis present

## 2022-09-21 DIAGNOSIS — R1900 Intra-abdominal and pelvic swelling, mass and lump, unspecified site: Secondary | ICD-10-CM | POA: Diagnosis not present

## 2022-09-21 DIAGNOSIS — R569 Unspecified convulsions: Secondary | ICD-10-CM | POA: Diagnosis not present

## 2022-09-21 DIAGNOSIS — F411 Generalized anxiety disorder: Secondary | ICD-10-CM | POA: Diagnosis present

## 2022-09-21 DIAGNOSIS — K8020 Calculus of gallbladder without cholecystitis without obstruction: Secondary | ICD-10-CM | POA: Diagnosis not present

## 2022-09-21 DIAGNOSIS — K2289 Other specified disease of esophagus: Secondary | ICD-10-CM | POA: Diagnosis present

## 2022-09-21 DIAGNOSIS — Z91048 Other nonmedicinal substance allergy status: Secondary | ICD-10-CM | POA: Diagnosis not present

## 2022-09-21 DIAGNOSIS — C7931 Secondary malignant neoplasm of brain: Secondary | ICD-10-CM | POA: Diagnosis not present

## 2022-09-21 DIAGNOSIS — Z85118 Personal history of other malignant neoplasm of bronchus and lung: Secondary | ICD-10-CM | POA: Diagnosis not present

## 2022-09-21 DIAGNOSIS — Z833 Family history of diabetes mellitus: Secondary | ICD-10-CM

## 2022-09-21 DIAGNOSIS — Z7984 Long term (current) use of oral hypoglycemic drugs: Secondary | ICD-10-CM

## 2022-09-21 DIAGNOSIS — Z825 Family history of asthma and other chronic lower respiratory diseases: Secondary | ICD-10-CM

## 2022-09-21 DIAGNOSIS — K859 Acute pancreatitis without necrosis or infection, unspecified: Secondary | ICD-10-CM | POA: Diagnosis not present

## 2022-09-21 DIAGNOSIS — Z841 Family history of disorders of kidney and ureter: Secondary | ICD-10-CM | POA: Diagnosis not present

## 2022-09-21 DIAGNOSIS — K5730 Diverticulosis of large intestine without perforation or abscess without bleeding: Secondary | ICD-10-CM | POA: Diagnosis not present

## 2022-09-21 HISTORY — DX: Acute pancreatitis without necrosis or infection, unspecified: K85.90

## 2022-09-21 LAB — CBC WITH DIFFERENTIAL/PLATELET
Abs Immature Granulocytes: 0.1 10*3/uL — ABNORMAL HIGH (ref 0.00–0.07)
Basophils Absolute: 0 10*3/uL (ref 0.0–0.1)
Basophils Relative: 0 %
Eosinophils Absolute: 0 10*3/uL (ref 0.0–0.5)
Eosinophils Relative: 0 %
HCT: 35.6 % — ABNORMAL LOW (ref 36.0–46.0)
Hemoglobin: 11.4 g/dL — ABNORMAL LOW (ref 12.0–15.0)
Immature Granulocytes: 1 %
Lymphocytes Relative: 7 %
Lymphs Abs: 0.6 10*3/uL — ABNORMAL LOW (ref 0.7–4.0)
MCH: 31.6 pg (ref 26.0–34.0)
MCHC: 32 g/dL (ref 30.0–36.0)
MCV: 98.6 fL (ref 80.0–100.0)
Monocytes Absolute: 0.3 10*3/uL (ref 0.1–1.0)
Monocytes Relative: 4 %
Neutro Abs: 7.1 10*3/uL (ref 1.7–7.7)
Neutrophils Relative %: 88 %
Platelets: 185 10*3/uL (ref 150–400)
RBC: 3.61 MIL/uL — ABNORMAL LOW (ref 3.87–5.11)
RDW: 18.4 % — ABNORMAL HIGH (ref 11.5–15.5)
WBC: 8 10*3/uL (ref 4.0–10.5)
nRBC: 0 % (ref 0.0–0.2)

## 2022-09-21 LAB — URINALYSIS, W/ REFLEX TO CULTURE (INFECTION SUSPECTED)
Bacteria, UA: NONE SEEN
Bilirubin Urine: NEGATIVE
Glucose, UA: NEGATIVE mg/dL
Hgb urine dipstick: NEGATIVE
Ketones, ur: NEGATIVE mg/dL
Nitrite: NEGATIVE
Protein, ur: NEGATIVE mg/dL
Specific Gravity, Urine: >1.046 — ABNORMAL HIGH (ref 1.005–1.030)
pH: 5 (ref 5.0–8.0)

## 2022-09-21 LAB — COMPREHENSIVE METABOLIC PANEL
ALT: 34 U/L (ref 0–44)
AST: 15 U/L (ref 15–41)
Albumin: 3 g/dL — ABNORMAL LOW (ref 3.5–5.0)
Alkaline Phosphatase: 59 U/L (ref 38–126)
Anion gap: 11 (ref 5–15)
BUN: 26 mg/dL — ABNORMAL HIGH (ref 8–23)
CO2: 27 mmol/L (ref 22–32)
Calcium: 8.7 mg/dL — ABNORMAL LOW (ref 8.9–10.3)
Chloride: 98 mmol/L (ref 98–111)
Creatinine, Ser: 0.79 mg/dL (ref 0.44–1.00)
GFR, Estimated: >60 mL/min (ref >60)
Glucose, Bld: 269 mg/dL — ABNORMAL HIGH (ref 70–99)
Potassium: 3.8 mmol/L (ref 3.5–5.1)
Sodium: 136 mmol/L (ref 135–145)
Total Bilirubin: 0.7 mg/dL (ref 0.3–1.2)
Total Protein: 6.2 g/dL — ABNORMAL LOW (ref 6.5–8.1)

## 2022-09-21 LAB — GLUCOSE, CAPILLARY: Glucose-Capillary: 191 mg/dL — ABNORMAL HIGH (ref 70–99)

## 2022-09-21 LAB — CBG MONITORING, ED: Glucose-Capillary: 202 mg/dL — ABNORMAL HIGH (ref 70–99)

## 2022-09-21 LAB — LIPASE, BLOOD: Lipase: 170 U/L — ABNORMAL HIGH (ref 11–51)

## 2022-09-21 MED ORDER — IOHEXOL 300 MG/ML  SOLN
100.0000 mL | Freq: Once | INTRAMUSCULAR | Status: AC | PRN
  Administered 2022-09-21: 100 mL via INTRAVENOUS

## 2022-09-21 MED ORDER — MORPHINE SULFATE (PF) 4 MG/ML IV SOLN
4.0000 mg | Freq: Once | INTRAVENOUS | Status: AC
  Administered 2022-09-21: 4 mg via INTRAVENOUS
  Filled 2022-09-21: qty 1

## 2022-09-21 MED ORDER — ENSURE MAX PROTEIN PO LIQD
11.0000 [oz_av] | Freq: Two times a day (BID) | ORAL | Status: DC
  Administered 2022-09-22 – 2022-09-30 (×10): 11 [oz_av] via ORAL
  Filled 2022-09-21 (×18): qty 330

## 2022-09-21 MED ORDER — METOPROLOL TARTRATE 5 MG/5ML IV SOLN: 2.5000 mg | Freq: Four times a day (QID) | INTRAVENOUS | Status: DC | PRN

## 2022-09-21 MED ORDER — PREDNISONE 20 MG PO TABS
80.0000 mg | ORAL_TABLET | Freq: Every day | ORAL | Status: AC
  Administered 2022-09-22 – 2022-09-24 (×3): 80 mg via ORAL
  Filled 2022-09-21 (×3): qty 4

## 2022-09-21 MED ORDER — ONDANSETRON HCL 4 MG/2ML IJ SOLN
4.0000 mg | Freq: Once | INTRAMUSCULAR | Status: AC
  Administered 2022-09-21: 4 mg via INTRAVENOUS
  Filled 2022-09-21: qty 2

## 2022-09-21 MED ORDER — HYDROMORPHONE HCL 1 MG/ML IJ SOLN
0.5000 mg | INTRAMUSCULAR | Status: DC | PRN
  Administered 2022-09-22: 0.5 mg via INTRAVENOUS
  Administered 2022-09-24 – 2022-09-25 (×4): 1 mg via INTRAVENOUS
  Filled 2022-09-21 (×5): qty 1

## 2022-09-21 MED ORDER — OXYCODONE HCL 5 MG PO TABS
5.0000 mg | ORAL_TABLET | Freq: Four times a day (QID) | ORAL | Status: DC | PRN
  Administered 2022-09-22 (×2): 5 mg via ORAL
  Filled 2022-09-21 (×2): qty 1

## 2022-09-21 MED ORDER — PANTOPRAZOLE SODIUM 40 MG PO TBEC
40.0000 mg | DELAYED_RELEASE_TABLET | Freq: Every day | ORAL | Status: DC
  Administered 2022-09-22 – 2022-09-26 (×5): 40 mg via ORAL
  Filled 2022-09-21 (×5): qty 1

## 2022-09-21 MED ORDER — ACETAMINOPHEN 325 MG PO TABS
650.0000 mg | ORAL_TABLET | Freq: Four times a day (QID) | ORAL | Status: DC | PRN
  Administered 2022-09-22 – 2022-09-29 (×2): 650 mg via ORAL
  Filled 2022-09-21 (×2): qty 2

## 2022-09-21 MED ORDER — ONDANSETRON HCL 4 MG PO TABS
4.0000 mg | ORAL_TABLET | Freq: Four times a day (QID) | ORAL | Status: DC | PRN
  Administered 2022-09-26: 4 mg via ORAL
  Filled 2022-09-21: qty 1

## 2022-09-21 MED ORDER — ACETAMINOPHEN 650 MG RE SUPP: 650.0000 mg | Freq: Four times a day (QID) | RECTAL | Status: DC | PRN

## 2022-09-21 MED ORDER — ONDANSETRON HCL 4 MG/2ML IJ SOLN
4.0000 mg | Freq: Four times a day (QID) | INTRAMUSCULAR | Status: DC | PRN
  Administered 2022-09-26 – 2022-09-30 (×5): 4 mg via INTRAVENOUS
  Filled 2022-09-21 (×4): qty 2

## 2022-09-21 MED ORDER — FUROSEMIDE 10 MG/ML IJ SOLN
20.0000 mg | Freq: Once | INTRAMUSCULAR | Status: AC
  Administered 2022-09-21: 20 mg via INTRAVENOUS
  Filled 2022-09-21: qty 2

## 2022-09-21 MED ORDER — HYDROMORPHONE HCL 1 MG/ML IJ SOLN
1.0000 mg | Freq: Once | INTRAMUSCULAR | Status: AC
  Administered 2022-09-21: 1 mg via INTRAVENOUS
  Filled 2022-09-21: qty 1

## 2022-09-21 MED ORDER — LACTATED RINGERS IV BOLUS
2000.0000 mL | Freq: Once | INTRAVENOUS | Status: AC
  Administered 2022-09-21: 2000 mL via INTRAVENOUS

## 2022-09-21 MED ORDER — FAMOTIDINE 20 MG PO TABS
20.0000 mg | ORAL_TABLET | Freq: Two times a day (BID) | ORAL | Status: DC
  Administered 2022-09-21 – 2022-09-30 (×18): 20 mg via ORAL
  Filled 2022-09-21 (×19): qty 1

## 2022-09-21 MED ORDER — INSULIN GLARGINE-YFGN 100 UNIT/ML ~~LOC~~ SOLN
15.0000 [IU] | Freq: Every day | SUBCUTANEOUS | Status: DC
  Administered 2022-09-22 – 2022-09-28 (×7): 15 [IU] via SUBCUTANEOUS
  Filled 2022-09-21 (×9): qty 0.15

## 2022-09-21 MED ORDER — LEVETIRACETAM 500 MG PO TABS
1000.0000 mg | ORAL_TABLET | Freq: Two times a day (BID) | ORAL | Status: DC
  Administered 2022-09-21 – 2022-09-30 (×18): 1000 mg via ORAL
  Filled 2022-09-21 (×18): qty 2

## 2022-09-21 MED ORDER — FOLIC ACID 1 MG PO TABS
1.0000 mg | ORAL_TABLET | Freq: Every day | ORAL | Status: DC
  Administered 2022-09-22 – 2022-09-30 (×9): 1 mg via ORAL
  Filled 2022-09-21 (×9): qty 1

## 2022-09-21 MED ORDER — CLONAZEPAM 0.5 MG PO TABS
0.5000 mg | ORAL_TABLET | Freq: Two times a day (BID) | ORAL | Status: DC | PRN
  Administered 2022-09-24 – 2022-09-30 (×4): 0.5 mg via ORAL
  Filled 2022-09-21 (×4): qty 1

## 2022-09-21 MED ORDER — INSULIN ASPART 100 UNIT/ML IJ SOLN
0.0000 [IU] | Freq: Three times a day (TID) | INTRAMUSCULAR | Status: DC
  Administered 2022-09-22: 3 [IU] via SUBCUTANEOUS
  Administered 2022-09-22: 8 [IU] via SUBCUTANEOUS
  Administered 2022-09-23 (×2): 3 [IU] via SUBCUTANEOUS
  Administered 2022-09-23: 11 [IU] via SUBCUTANEOUS
  Administered 2022-09-24: 3 [IU] via SUBCUTANEOUS
  Administered 2022-09-24: 11 [IU] via SUBCUTANEOUS
  Administered 2022-09-24: 8 [IU] via SUBCUTANEOUS
  Administered 2022-09-25: 3 [IU] via SUBCUTANEOUS
  Administered 2022-09-26: 2 [IU] via SUBCUTANEOUS
  Administered 2022-09-27: 3 [IU] via SUBCUTANEOUS
  Administered 2022-09-27: 5 [IU] via SUBCUTANEOUS
  Administered 2022-09-27 – 2022-09-29 (×5): 2 [IU] via SUBCUTANEOUS
  Administered 2022-09-29: 3 [IU] via SUBCUTANEOUS
  Administered 2022-09-30 (×2): 5 [IU] via SUBCUTANEOUS
  Administered 2022-09-30: 2 [IU] via SUBCUTANEOUS
  Filled 2022-09-21: qty 0.15

## 2022-09-21 NOTE — Assessment & Plan Note (Signed)
Continue klonopin PRN

## 2022-09-21 NOTE — Assessment & Plan Note (Addendum)
62 year old with 1 month history of abdominal pain and recent hospitalization for pancreatitis who presented with acutely worsening abdominal pain x 1 day with mildly elevated lipase (improved from previous), unchanged hyodensities in pancreas and slightly increased size in fluid collection abutting the greater curvature of the stomach. Increased body wall edema.  -obs to med-surg -This does not appear to be pancreatitis. Pain is in her lower abdomen down into her mons pubis area. No findings except for body wall edema on CT in this area which could be culprit for the pain from high dose steroid taper  -will do 20mg  IV lasix x1 and see if any relief-strict I/O  -lipase downtrending. Thought possibly due to her immunotherapy on her hospitalization in June and this was stopped. She does have a large gallstone measuring 4.3cm, but no ductal dilatation and no transaminitis. Lipase actually trending downward and eating and drinking fine  -GI consulted by EDP . F/u with recs. Will see tomorrow -check UA which has reflexed to culture.  -f/u on culture  -wet prep negative for yeast/BV. HSV cx pending but has no vesicular lesions -clear liquid diet  -IV pain medication and home oxy  -anti emetics.  -trend lipase  -consider echo

## 2022-09-21 NOTE — ED Provider Notes (Signed)
[place] EMERGENCY DEPARTMENT AT [facility] Provider Note   CSN: [id number] Arrival date & time: 09/21/22  1529     History  Chief Complaint  Patient presents with   Abdominal Pain    LYLITH KERTIS is a 62 y.o. female.  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  She states this has been an ongoing issue however it has been worse since 7 PM last night.  Her home oxycodone has not been helping.  Does not endorse any nausea or vomiting, but does endorse intense abdominal pain.  States it feels similar to her prior pancreatitis flareups.  Also endorses some dysuria.  No flank pain.  The history is provided by the patient. No language interpreter was used.       Home Medications Prior to Admission medications   Medication Sig Start Date End Date Taking? Authorizing Provider  albuterol (VENTOLIN HFA) 108 (90 Base) MCG/ACT inhaler Inhale 2 puffs into the lungs every 6 (six) hours as needed. 01/08/21   [name], FNP  clonazePAM (KLONOPIN) 0.5 MG tablet Take 1 tablet (0.5 mg total) by mouth 2 (two) times daily. Patient taking differently: Take 0.5 mg by mouth 2 (two) times daily as needed for anxiety. 08/30/22   [name], FNP  Ensure Max Protein (ENSURE MAX PROTEIN) LIQD Take 330 mLs (11 oz total) by mouth 2 (two) times daily. 09/11/22   Hongalgi, Maximino Greenland, MD  famotidine (PEPCID) 20 MG tablet Take 1 tablet (20 mg total) by mouth 2 (two) times daily. 08/22/22   [name], MD  fluconazole (DIFLUCAN) 150 MG tablet Take 1 tablet by mouth every 3 days as needed (yeast infection). 08/29/22   [name], FNP  folic acid (FOLVITE) 1 MG tablet Take 1 tablet by mouth once daily 09/16/22   Heilingoetter, [name], PA-C  furosemide (LASIX) 20 MG tablet Take 1 tablet (20 mg total) by mouth daily as needed for fluid or edema. 09/20/22   Jannifer Rodney  A, FNP  insulin glargine (LANTUS SOLOSTAR) 100 UNIT/ML Solostar Pen Inject 15 Units into the skin daily. 09/12/22   Hongalgi, Maximino Greenland, MD  Insulin Pen Needle (PEN NEEDLES) 32G X 4 MM MISC Use to inject insulin daily as directed. 07/09/22   [name], FNP  levETIRAcetam (KEPPRA) 1000 MG tablet Take 1 tablet (1,000 mg total) by mouth 2 (two) times daily. 07/31/22   [name], MD  lidocaine (XYLOCAINE) 5 % ointment Apply topically as needed. Patient taking differently: Apply 1 Application topically as needed for mild pain. 05/30/22   [name], FNP  lidocaine-prilocaine (EMLA) cream Apply to port prior to access 04/25/22   [name], MD  metFORMIN (GLUCOPHAGE) 1000 MG tablet Take 1 tablet by mouth 2 times daily. 09/16/22   [name], FNP  MYLANTA MAXIMUM STRENGTH 400-400-40 MG/5ML suspension Take 15 mLs by mouth See admin instructions. Take 15 ml's by mouth midday and bedtime    [provider]  ondansetron (ZOFRAN) 4 MG tablet Take 1 tablet (4 mg total) by mouth every 6 (six) hours as needed for nausea. Patient taking differently: Take 4-8 mg by mouth every 6 (six) hours as needed for nausea. 07/17/22   [name], [name], MD  oxyCODONE (OXY IR/ROXICODONE) 5 MG immediate release tablet Take 1 tablet (5 mg total) by mouth every 8 (eight) hours as needed  for moderate pain or severe pain. 09/20/22   [name], FNP  oxycodone (OXY-IR) 5 MG capsule Take 1 capsule (5 mg total) by mouth every 6 (six) hours as needed. 10/18/22   [name], FNP  oxycodone (OXY-IR) 5 MG capsule Take 1 capsule (5 mg total) by mouth every 6 (six) hours as needed. 11/14/22   [name], FNP  pantoprazole (PROTONIX) 40 MG tablet Take 1 tablet (40 mg total) by mouth daily. 09/13/22   Hongalgi, Maximino Greenland, MD  predniSONE (DELTASONE) 20 MG tablet Take 5 tablets daily x 5 days THEN take 4 tablets daily x 7 days THEN 3 tablets daily x 7 days THEN take 2 tablets daily x 7 days THEN take 1  tablet daily x 7 days THEN take 1/2 tablet daily x 7 days.  Follow-up with oncology MD regarding need for change in regimen. 09/12/22   Hongalgi, Maximino Greenland, MD  prochlorperazine (COMPAZINE) 10 MG tablet Take 1 tablet (10 mg total) by mouth every 6 (six) hours as needed. Patient taking differently: Take 10 mg by mouth every 6 (six) hours as needed for nausea or vomiting. 05/30/22   Heilingoetter, [name], PA-C  triamcinolone ointment (KENALOG) 0.5 % Apply topically to affected area twice daily as directed, 07/30/22   Johnson, Clanford L, MD  TYLENOL 500 MG tablet Take 1,000 mg by mouth every 6 (six) hours as needed for mild pain or headache.    [provider]      Allergies    Adhesive [tape] and Statins    Review of Systems   Review of Systems  Constitutional:  Negative for chills and fever.  Respiratory:  Negative for shortness of breath.   Gastrointestinal:  Positive for abdominal pain. Negative for nausea and vomiting.  Genitourinary:  Positive for dysuria. Negative for flank pain.  All other systems reviewed and are negative.   Physical Exam Updated Vital Signs BP (!) 140/82 (BP Location: Left Wrist)   Pulse 80   Temp 97.8 F (36.6 C) (Oral)   Resp 18   Ht 5\' 8"  (1.727 m)   Wt 99.8 kg   SpO2 96%   BMI 33.45 kg/m  Physical Exam Vitals and nursing note reviewed.  Constitutional:      General: She is not in acute distress.    Appearance: Normal appearance. She is not ill-appearing.  HENT:     Head: Normocephalic and atraumatic.     Nose: Nose normal.  Eyes:     General: No scleral icterus.    Extraocular Movements: Extraocular movements intact.     Conjunctiva/sclera: Conjunctivae normal.  Cardiovascular:     Rate and Rhythm: Normal rate and regular rhythm.     Heart sounds: Normal heart sounds.  Pulmonary:     Effort: Pulmonary effort is normal. No respiratory distress.     Breath sounds: Normal breath sounds. No wheezing or rales.  Abdominal:     General:  There is no distension.     Palpations: Abdomen is soft.     Tenderness: There is abdominal tenderness. There is guarding.  Musculoskeletal:        General: No deformity. Normal range of motion.     Cervical back: Normal range of motion.  Skin:    General: Skin is warm and dry.     Findings: No rash.  Neurological:     General: No focal deficit present.     Mental Status: She is alert. Mental status is at  baseline.     ED Results / Procedures / Treatments   Labs (all labs ordered are listed, but only abnormal results are displayed) Labs Reviewed  CBC WITH DIFFERENTIAL/PLATELET  COMPREHENSIVE METABOLIC PANEL  LIPASE, BLOOD  URINALYSIS, W/ REFLEX TO CULTURE (INFECTION SUSPECTED)    EKG None  Radiology No results found.  Procedures Procedures    Medications Ordered in ED Medications  morphine (PF) 4 MG/ML injection 4 mg (has no administration in time range)  lactated ringers bolus 2,000 mL (has no administration in time range)  ondansetron (ZOFRAN) injection 4 mg (has no administration in time range)    ED Course/ Medical Decision Making/ A&P Clinical Course as of 09/21/22 2214  Sat Sep 21, 2022  2057 I was consulted regarding care plan. Patient with extensive medical history presenting with abdominal pain.  Diagnoses pancreatitis earlier this week underwent CT scan MRCP which showed a large fluid collection on the greater curvature of the stomach.  A addendum read demonstrated that it is likely a pancreatic pseudocyst while a MRCP read demonstrated that it is sterility indeterminate. Today the fluid collection is growing.  Planning for GI consultation whether patient needs IR drainage or expectant management at this time.  Pain being treated with plan for interval reassessment. [CC]    Clinical Course User Index [CC] Glyn Ade, MD                             Medical Decision Making Amount and/or Complexity of Data Reviewed Labs: ordered. Radiology:  ordered.  Risk Prescription drug management. Decision regarding hospitalization.   Medical Decision Making / ED Course   This patient presents to the ED for concern of abdominal pain, this involves an extensive number of treatment options, and is a complaint that carries with it a high risk of complications and morbidity.  The differential diagnosis includes pancreatitis, UTI, cholecystitis, gastroenteritis, diverticulitis  MDM: 62 year old female with past as above presents today for evaluation of abdominal pain. has history of pancreatitis.  States this feels similar.  No nausea or vomiting.  Will provide pain control, obtain labs, CT imaging.  CBC without leukocytosis.  Hemoglobin around patient's baseline.  CMP shows preserved renal function, normal electrolytes.  Lipase is 170.  This is downtrending from recent labs within the past 2 weeks.  CT shows no evidence of acute pancreatitis.  Does show pseudocyst which has increased in size.  Discussed with Dr. Bosie Clos of gastroenterology who states he will see patient in consult tomorrow if patient gets admitted.  Given the uncontrolled nature of patient's pain after multiple rounds of pain medication discussed with hospitalist for admission.  Discussed with hospitalist will evaluate patient for admission.   Additional history obtained: -Additional history obtained from outpatient visit, recent admission -External records from outside source obtained and reviewed including: Chart review including previous notes, labs, imaging, consultation notes   Lab Tests: -I ordered, reviewed, and interpreted labs.   The pertinent results include:   Labs Reviewed  CBC WITH DIFFERENTIAL/PLATELET  COMPREHENSIVE METABOLIC PANEL  LIPASE, BLOOD  URINALYSIS, W/ REFLEX TO CULTURE (INFECTION SUSPECTED)      EKG  EKG Interpretation Date/Time:    Ventricular Rate:    PR Interval:    QRS Duration:    QT Interval:    QTC Calculation:   R Axis:       Text Interpretation:           Imaging Studies ordered:  I ordered imaging studies including CT abdomen pelvis with contrast I independently visualized and interpreted imaging. I agree with the radiologist interpretation   Medicines ordered and prescription drug management: Meds ordered this encounter  Medications   morphine (PF) 4 MG/ML injection 4 mg   lactated ringers bolus 2,000 mL   ondansetron (ZOFRAN) injection 4 mg    -I have reviewed the patients home medicines and have made adjustments as needed  Reevaluation: After the interventions noted above, I reevaluated the patient and found that they have :stayed the same  Co morbidities that complicate the patient evaluation  Past Medical History:  Diagnosis Date   Abscess    hydroadenitis superativa   Anxiety    DM (diabetes mellitus) (HCC)    Hyperinsulinemia    Hypertension 1983   gestational - pre-eclampsia   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[facility])    Neuroma    audiotry neuroma   Pancreatitis    Seizure (HCC)    Vertigo       Dispostion: Patient discussed with hospitalist will evaluate patient for admission  Final Clinical Impression(s) / ED Diagnoses Final diagnoses:  Epigastric pain  Pancreatic pseudocyst    Rx / DC Orders ED Discharge Orders     None         Marita Kansas, PA-C 09/21/22 2219    Glyn Ade, MD 09/21/22 2252

## 2022-09-21 NOTE — Assessment & Plan Note (Addendum)
Recent A1C of 8.4 in June 2024  Hold metformin while inpatient  Continue long acting insulin at 15 units/day  SSI and accuchecks QAC/HS

## 2022-09-21 NOTE — H&P (Signed)
History and Physical    Patient: Carolyn Hooper NWG:[id number] DOB: March 02, 1961 DOA: 09/21/2022 DOS: the patient was seen and examined on 09/21/2022 PCP: [name], FNP  Patient coming from: Home - lives with a roommate. Has a walker she has in case she needs it.    Chief Complaint: abdominal pain   HPI: Carolyn Hooper is a 62 y.o. 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  Currently on immunotherapy / chemotherapy with Dr. Shirline Frees, anxiety, recent hospitalization for pancreatitis who presented to ED with complaints of abdominal pain x30 days; however, the pain was worse since 7pm last night. Known pancreatic pseudocyst. She states pain is in her lower abdomen. Not like her episode of pancreatitis pain. She states the pain is stabbing and other times it's dull and achy. She saw her PCP yesterday. Wet prep negative. HSV culture sent. She has some dysuria at times. She has had no N/V and no fever/chills. She has been eating and drinking fine until tonight when this seemed to upset her abdomen.   Admitted on 09/04/22 for acute pancreatitis. Thought could be related to her immunotherapy and this was discontinued per dr. Arbutus Ped. Discharged on high dose prednisone taper. Gallstone induced on differential. ? If GI consider an ERCP. Started on Creon as well.   She no longer smokes and doesn't drink alcohol.    Denies any fever/chills,chest pain or palpitations, shortness of breath or cough, N/V/D, or leg swelling.    ER Course:  vitals: afebrile, bp: 142/80, HR; 73, RR: 18, oxygen: 94% on Ra Pertinent labs: hgb: 11.4, glucose: 269, BUN: 26, lipase: 170,  Ct abdomen/pelvis: . Enhancing fluid collection abutting the greater curvature of the stomach has mildly increased in size. 2. Stable small amount of free fluid in the abdomen and pelvis. 3. New small right pleural effusion. 4. Stable left lower lobe mass. 5. Stable stable hypodense  lesions in the pancreas, unchanged from prior MRI. 6. Increased body wall edema. 7. Cholelithiasis. In ED: given 2L IVF, zofran, morphine and dilaudid. GI consulted. Trh asked to admit.    Review of Systems: As mentioned in the history of present illness. All other systems reviewed and are negative. Past Medical History:  Diagnosis Date   Abscess    hydroadenitis superativa   Anxiety    DM (diabetes mellitus) (HCC)    Hyperinsulinemia    Hypertension 1983   gestational - pre-eclampsia   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[facility])    Neuroma    audiotry neuroma   Pancreatitis    Seizure (HCC)    Vertigo    Past Surgical History:  Procedure Laterality Date   ABDOMINAL HYSTERECTOMY  2006   remaining left ovary   BRONCHIAL BIOPSY  02/19/2022   Procedure: BRONCHIAL BIOPSIES;  Surgeon: Josephine Igo, DO;  Location: [facility];  Service: Pulmonary;;   BRONCHIAL BRUSHINGS  02/19/2022   Procedure: BRONCHIAL BRUSHINGS;  Surgeon: Josephine Igo, DO;  Location: [facility];  Service: Pulmonary;;   BRONCHIAL NEEDLE ASPIRATION BIOPSY  02/19/2022   Procedure: BRONCHIAL NEEDLE ASPIRATION BIOPSIES;  Surgeon: Josephine Igo, DO;  Location: [facility];  Service: Pulmonary;;   IR IMAGING GUIDED PORT INSERTION  03/29/2022   SHOULDER SURGERY Right 1960s   Social History:  reports that she quit smoking about 7 months ago. Her smoking use included cigarettes. She smoked an average of .75 packs per day. She has never used smokeless tobacco. She reports  current alcohol use. She reports that she does not use drugs.  Allergies  Allergen Reactions   Adhesive [Tape] Dermatitis   Statins Itching and Other (See Comments)    Myalgias, also    Family History  Problem Relation Age of Onset   COPD Mother    Heart disease Father    Diabetes Father    Kidney disease Father    Arthritis Father        RA   Liver disease Father     Prior to Admission medications   Medication Sig Start Date End  Date Taking? Authorizing Provider  albuterol (VENTOLIN HFA) 108 (90 Base) MCG/ACT inhaler Inhale 2 puffs into the lungs every 6 (six) hours as needed. 01/08/21   [name], FNP  clonazePAM (KLONOPIN) 0.5 MG tablet Take 1 tablet (0.5 mg total) by mouth 2 (two) times daily. Patient taking differently: Take 0.5 mg by mouth 2 (two) times daily as needed for anxiety. 08/30/22   [name], FNP  Ensure Max Protein (ENSURE MAX PROTEIN) LIQD Take 330 mLs (11 oz total) by mouth 2 (two) times daily. 09/11/22   Hongalgi, Maximino Greenland, MD  famotidine (PEPCID) 20 MG tablet Take 1 tablet (20 mg total) by mouth 2 (two) times daily. 08/22/22   [name], MD  fluconazole (DIFLUCAN) 150 MG tablet Take 1 tablet by mouth every 3 days as needed (yeast infection). 08/29/22   [name], FNP  folic acid (FOLVITE) 1 MG tablet Take 1 tablet by mouth once daily 09/16/22   Heilingoetter, [name], PA-C  furosemide (LASIX) 20 MG tablet Take 1 tablet (20 mg total) by mouth daily as needed for fluid or edema. 09/20/22   [name], FNP  insulin glargine (LANTUS SOLOSTAR) 100 UNIT/ML Solostar Pen Inject 15 Units into the skin daily. 09/12/22   Hongalgi, Maximino Greenland, MD  Insulin Pen Needle (PEN NEEDLES) 32G X 4 MM MISC Use to inject insulin daily as directed. 07/09/22   [name], FNP  levETIRAcetam (KEPPRA) 1000 MG tablet Take 1 tablet (1,000 mg total) by mouth 2 (two) times daily. 07/31/22   [name], MD  lidocaine (XYLOCAINE) 5 % ointment Apply topically as needed. Patient taking differently: Apply 1 Application topically as needed for mild pain. 05/30/22   [name], FNP  lidocaine-prilocaine (EMLA) cream Apply to port prior to access 04/25/22   [name], MD  metFORMIN (GLUCOPHAGE) 1000 MG tablet Take 1 tablet by mouth 2 times daily. 09/16/22   [name], FNP  MYLANTA MAXIMUM STRENGTH 400-400-40 MG/5ML suspension Take 15 mLs by mouth See admin instructions. Take 15 ml's by mouth  midday and bedtime    [provider]  ondansetron (ZOFRAN) 4 MG tablet Take 1 tablet (4 mg total) by mouth every 6 (six) hours as needed for nausea. Patient taking differently: Take 4-8 mg by mouth every 6 (six) hours as needed for nausea. 07/17/22   [name], [name], MD  oxyCODONE (OXY IR/ROXICODONE) 5 MG immediate release tablet Take 1 tablet (5 mg total) by mouth every 8 (eight) hours as needed for moderate pain or severe pain. 09/20/22   [name], FNP  oxycodone (OXY-IR) 5 MG capsule Take 1 capsule (5 mg total) by mouth every 6 (six) hours as needed. 10/18/22   [name], FNP  oxycodone (OXY-IR) 5 MG capsule Take 1 capsule (5 mg total) by mouth every 6 (six) hours as needed. 11/14/22   Jannifer Rodney  A, FNP  pantoprazole (PROTONIX) 40 MG tablet Take 1 tablet (40 mg total) by mouth daily. 09/13/22   Hongalgi, Maximino Greenland, MD  predniSONE (DELTASONE) 20 MG tablet Take 5 tablets daily x 5 days THEN take 4 tablets daily x 7 days THEN 3 tablets daily x 7 days THEN take 2 tablets daily x 7 days THEN take 1 tablet daily x 7 days THEN take 1/2 tablet daily x 7 days.  Follow-up with oncology MD regarding need for change in regimen. 09/12/22   Hongalgi, Maximino Greenland, MD  prochlorperazine (COMPAZINE) 10 MG tablet Take 1 tablet (10 mg total) by mouth every 6 (six) hours as needed. Patient taking differently: Take 10 mg by mouth every 6 (six) hours as needed for nausea or vomiting. 05/30/22   Heilingoetter, [name], PA-C  triamcinolone ointment (KENALOG) 0.5 % Apply topically to affected area twice daily as directed, 07/30/22   Johnson, Clanford L, MD  TYLENOL 500 MG tablet Take 1,000 mg by mouth every 6 (six) hours as needed for mild pain or headache.    [provider]    Physical Exam: Vitals:   09/21/22 1800 09/21/22 2036 09/21/22 2038 09/21/22 2200  BP: (!) 148/98  (!) 142/80 (!) 154/81  Pulse: 72  73 68  Resp: 18   14  Temp:  97.7 F (36.5 C)    TempSrc:  Oral    SpO2: 94%  94%  91%  Weight:      Height:       General:  Appears calm and comfortable and is in NAD. Obese  Eyes:  PERRL, EOMI, normal lids, iris ENT:  hearing loss in right ear.  lips & tongue, mmm; appropriate dentition Neck:  no LAD, masses or thyromegaly; no carotid bruits Cardiovascular:  RRR, no m/r/g. No LE edema.  Respiratory:   CTA bilaterally with no wheezes/rales/rhonchi.  Normal respiratory effort. Abdomen:  soft, mildly distended. TTP in bilateral lower quadrants/mons pubic area. BS+  Back:   normal alignment, no CVAT Skin:  no rash or induration seen on limited exam. Port on right anterior chest wall  Musculoskeletal:  grossly normal tone BUE/BLE, good ROM, no bony abnormality Lower extremity:  No LE edema.  Limited foot exam with no ulcerations.  2+ distal pulses. Psychiatric:  grossly normal mood and affect, speech fluent and appropriate, aphasic and slow at times , AOx3 Neurologic:  CN 2-12 grossly intact, moves all extremities in coordinated fashion, sensation intact   Radiological Exams on Admission: Independently reviewed - see discussion in A/P where applicable  CT ABDOMEN PELVIS W CONTRAST  Result Date: 09/21/2022 CLINICAL DATA:  Acute abdominal pain EXAM: CT ABDOMEN AND PELVIS WITH CONTRAST TECHNIQUE: Multidetector CT imaging of the abdomen and pelvis was performed using the standard protocol following bolus administration of intravenous contrast. RADIATION DOSE REDUCTION: This exam was performed according to the departmental dose-optimization program which includes automated exposure control, adjustment of the mA and/or kV according to patient size and/or use of iterative reconstruction technique. CONTRAST:  OMNIPAQUE IOHEXOL 300 MG/ML  SOLN COMPARISON:  CT abdomen and pelvis 09/04/2022. MRI abdomen 09/05/2022. FINDINGS: Lower chest: There is a new small right pleural effusion. Left lower lobe mass measures 3.7 x 4.8 cm and appears unchanged. Hepatobiliary: Gallstones are again  seen including a large gallstone measuring 4.3 cm. There is no biliary ductal dilatation. No focal liver lesions are seen. Pancreas: Hypodensity in the head of the pancreas measuring 7 mm image 2/34 appears unchanged. Focal hypodensity  in the pancreatic tail measuring 15 x 8 mm image 2/22 is unchanged. No acute inflammation or ductal dilatation. Spleen: Normal in size without focal abnormality. Adrenals/Urinary Tract: Adrenal glands are unremarkable. Kidneys are normal, without renal calculi, focal lesion, or hydronephrosis. Bladder is unremarkable. Stomach/Bowel: Enhancing fluid collection abutting the greater curvature of the stomach is again seen and has increased in size now measuring 9.3 by 12.3 by 6.8 cm (previously 6.1 by 8.7 x 5.3 cm). Second smaller fluid collection adjacent to the gastric fundus image 2/17 measures 1.7 x 1.3 by 1.0 cm and appears similar to the prior study. The stomach is otherwise within normal limits. There is no bowel obstruction, pneumatosis or free air. The appendix is not seen. There is sigmoid colon diverticulosis. Vascular/Lymphatic: Aortic atherosclerosis. No enlarged abdominal or pelvic lymph nodes. Reproductive: Status post hysterectomy. No adnexal masses. Other: There is a small amount of free fluid in the pelvis and right upper quadrant, unchanged. There is mild body wall edema, increased from prior. Musculoskeletal: Degenerative changes affect the spine and hips. Sclerotic density in the left femoral head is unchanged. IMPRESSION: 1. Enhancing fluid collection abutting the greater curvature of the stomach has mildly increased in size. 2. Stable small amount of free fluid in the abdomen and pelvis. 3. New small right pleural effusion. 4. Stable left lower lobe mass. 5. Stable stable hypodense lesions in the pancreas, unchanged from prior MRI. 6. Increased body wall edema. 7. Cholelithiasis. Aortic Atherosclerosis (ICD10-I70.0). Electronically Signed   By: Darliss Cheney M.D.    On: 09/21/2022 20:20     Labs on Admission: I have personally reviewed the available labs and imaging studies at the time of the admission.  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 Increased body wall edema.  -obs to med-surg -This does not appear to be pancreatitis. Pain is in her lower abdomen down into her mons pubis area. No findings except for body wall edema on CT in this area which could be culprit for the pain from high dose steroid taper  -will do 20mg  IV lasix x1 and see if any relief-strict I/O  -lipase downtrending. Thought possibly due to her immunotherapy on her hospitalization in June and this was stopped. She does have a large gallstone measuring 4.3cm, but no ductal dilatation and no transaminitis. Lipase actually trending downward and eating and drinking fine  -GI consulted by EDP . F/u with recs. Will see tomorrow -check UA which has reflexed to culture.  -f/u on culture  -wet prep negative for yeast/BV. HSV cx pending but has no vesicular lesions -clear liquid diet  -IV pain medication and home oxy  -anti emetics.  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 If staying longer than 3 days will need to order the next taper down   Elevated blood pressure reading Possibly due to pain and/or steroids  IV metoprolol prn if needed   Non-small cell lung cancer (HCC) Follows with Dr. Arbutus Ped Immunotherapy stopped at recent hospitalization due to possibility of link with pancreatitis.  Will send message that she is getting admitted   Focal seizure (HCC) Continue keppra Seizure precautions   GAD (generalized anxiety disorder) Continue klonopin PRN   GERD (gastroesophageal reflux disease) Continue pepcid BID Protonix daily     Advance Care Planning:   Code Status: Full Code   Consults: GI: [facility]   DVT Prophylaxis: SCDs  Family Communication: none   Severity of Illness: The appropriate patient status for this patient is OBSERVATION. Observation status is judged to be reasonable and necessary in order to provide the required intensity of service to ensure the patient's safety. The patient's presenting symptoms, physical exam findings, and initial radiographic and laboratory data in the context of their medical condition is felt to place them at decreased risk for further clinical deterioration. Furthermore, it is anticipated that the patient will be medically stable for discharge from the hospital within 2 midnights of admission.   Author: Orland Mustard, MD 09/21/2022 11:14 PM  For on call review [email or web address].

## 2022-09-21 NOTE — Subjective & Objective (Signed)
Pt with known hx of NSCLC with brain mets and focal seizures on Keppra Decadron for brain mets  Followed by Dr. Shirline Frees Here with abd pain no N/V Had pancreatitis in the past

## 2022-09-21 NOTE — Assessment & Plan Note (Signed)
Continue keppra  °Seizure precautions  °

## 2022-09-21 NOTE — Assessment & Plan Note (Signed)
Possibly due to pain and/or steroids  IV metoprolol prn if needed

## 2022-09-21 NOTE — Assessment & Plan Note (Addendum)
Continue keprra Continue prednisone taper Ordered her 80mg  for next 3 days. If staying longer than 3 days will need to order the next taper down

## 2022-09-21 NOTE — Assessment & Plan Note (Signed)
Follows with Dr. Arbutus Ped Immunotherapy stopped at recent hospitalization due to possibility of link with pancreatitis.  Will send message that she is getting admitted

## 2022-09-21 NOTE — Assessment & Plan Note (Signed)
Continue pepcid BID Protonix daily

## 2022-09-21 NOTE — ED Notes (Signed)
ED TO INPATIENT HANDOFF REPORT  ED Nurse Name and Phone #: Rod Holler Name/Age/Gender Carolyn Hooper 62 y.o. female Room/Bed: WA17/WA17  Code Status   Code Status: Prior  Home/SNF/Other Home Patient oriented to: self, place, time, and situation Is this baseline? 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 [K86.3, K85.90]  Triage Note No notes on file   Allergies Allergies  Allergen Reactions   Adhesive [Tape] Dermatitis   Statins Itching and Other (See Comments)    Myalgias, also    Level of Care/Admitting Diagnosis ED Disposition     ED Disposition  Admit   Condition  --   Comment  Hospital Area: [facility] [[id number]]  Level of Care: Med-Surg [16]  May place patient in observation at [facility] or [facility] if equivalent level of care is available:: Yes  Covid Evaluation: Asymptomatic - no recent exposure (last 10 days) testing not required  Diagnosis: 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 [[id number]]  Admitting Physician: Orland Mustard [[id number]]  Attending Physician: Alton Revere          B Medical/Surgery History Past Medical History:  Diagnosis Date   Abscess    hydroadenitis superativa   Anxiety    DM (diabetes mellitus) (HCC)    Hyperinsulinemia    Hypertension 1983   gestational - pre-eclampsia   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[facility])    Neuroma    audiotry neuroma   Pancreatitis    Seizure (HCC)    Vertigo    Past Surgical History:  Procedure Laterality Date   ABDOMINAL HYSTERECTOMY  2006   remaining left ovary   BRONCHIAL BIOPSY  02/19/2022   Procedure: BRONCHIAL BIOPSIES;  Surgeon: Josephine Igo, DO;  Location: [facility] ENDOSCOPY;  Service: Pulmonary;;   BRONCHIAL BRUSHINGS  02/19/2022   Procedure: BRONCHIAL BRUSHINGS;  Surgeon: Josephine Igo, DO;  Location: [facility] ENDOSCOPY;  Service: Pulmonary;;   BRONCHIAL NEEDLE ASPIRATION  BIOPSY  02/19/2022   Procedure: BRONCHIAL NEEDLE ASPIRATION BIOPSIES;  Surgeon: Josephine Igo, DO;  Location: [facility] ENDOSCOPY;  Service: Pulmonary;;   IR IMAGING GUIDED PORT INSERTION  03/29/2022   SHOULDER SURGERY Right 1960s     A IV Location/Drains/Wounds Patient Lines/Drains/Airways Status     Active Line/Drains/Airways     Name Placement date Placement time Site Days   Implanted Port 03/29/22 Right Chest 03/29/22  1402  Chest  176   Implanted Port 07/26/22 Right Chest 07/26/22  1259  Chest  57            Intake/Output Last 24 hours No intake or output data in the 24 hours ending 09/21/22 2232  Labs/Imaging Results for orders placed or performed during the hospital encounter of 09/21/22 (from the past 48 hour(s))  CBC with Differential/Platelet     Status: Abnormal   Collection Time: 09/21/22  5:00 PM  Result Value Ref Range   WBC 8.0 4.0 - 10.5 K/uL   RBC 3.61 (L) 3.87 - 5.11 MIL/uL   Hemoglobin 11.4 (L) 12.0 - 15.0 g/dL   HCT 19.1 (L) 47.8 - 29.5 %   MCV 98.6 80.0 - 100.0 fL   MCH 31.6 26.0 - 34.0 pg   MCHC 32.0 30.0 - 36.0 g/dL   RDW 62.1 (H) 30.8 - 65.7 %   Platelets 185 150 - 400 K/uL   nRBC 0.0 0.0 - 0.2 %   Neutrophils Relative % 88 %  Neutro Abs 7.1 1.7 - 7.7 K/uL   Lymphocytes Relative 7 %   Lymphs Abs 0.6 (L) 0.7 - 4.0 K/uL   Monocytes Relative 4 %   Monocytes Absolute 0.3 0.1 - 1.0 K/uL   Eosinophils Relative 0 %   Eosinophils Absolute 0.0 0.0 - 0.5 K/uL   Basophils Relative 0 %   Basophils Absolute 0.0 0.0 - 0.1 K/uL   Immature Granulocytes 1 %   Abs Immature Granulocytes 0.10 (H) 0.00 - 0.07 K/uL    Comment: Performed at [facility], [address], [place], [place] [address]  Comprehensive metabolic panel     Status: Abnormal   Collection Time: 09/21/22  5:00 PM  Result Value Ref Range   Sodium 136 135 - 145 mmol/L   Potassium 3.8 3.5 - 5.1 mmol/L   Chloride 98 98 - 111 mmol/L   CO2 27 22 - 32 mmol/L   Glucose, Bld 269  (H) 70 - 99 mg/dL    Comment: Glucose reference range applies only to samples taken after fasting for at least 8 hours.   BUN 26 (H) 8 - 23 mg/dL   Creatinine, Ser 6.04 0.44 - 1.00 mg/dL   Calcium 8.7 (L) 8.9 - 10.3 mg/dL   Total Protein 6.2 (L) 6.5 - 8.1 g/dL   Albumin 3.0 (L) 3.5 - 5.0 g/dL   AST 15 15 - 41 U/L   ALT 34 0 - 44 U/L   Alkaline Phosphatase 59 38 - 126 U/L   Total Bilirubin 0.7 0.3 - 1.2 mg/dL   GFR, Estimated >54 >09 mL/min    Comment: (NOTE) Calculated using the CKD-EPI Creatinine Equation (2021)    Anion gap 11 5 - 15    Comment: Performed at [facility], [address] [place], [place] [address]  Lipase, blood     Status: Abnormal   Collection Time: 09/21/22  5:00 PM  Result Value Ref Range   Lipase 170 (H) 11 - 51 U/L    Comment: Performed at [facility], [address] [place], [place] [address]  CBG monitoring, ED     Status: Abnormal   Collection Time: 09/21/22  8:08 PM  Result Value Ref Range   Glucose-Capillary 202 (H) 70 - 99 mg/dL    Comment: Glucose reference range applies only to samples taken after fasting for at least 8 hours.  Urinalysis, w/ Reflex to Culture (Infection Suspected) -Urine, Clean Catch     Status: Abnormal   Collection Time: 09/21/22  9:54 PM  Result Value Ref Range   Specimen Source URINE, CLEAN CATCH    Color, Urine YELLOW YELLOW   APPearance CLEAR CLEAR   Specific Gravity, Urine >1.046 (H) 1.005 - 1.030   pH 5.0 5.0 - 8.0   Glucose, UA NEGATIVE NEGATIVE mg/dL   Hgb urine dipstick NEGATIVE NEGATIVE   Bilirubin Urine NEGATIVE NEGATIVE   Ketones, ur NEGATIVE NEGATIVE mg/dL   Protein, ur NEGATIVE NEGATIVE mg/dL   Nitrite NEGATIVE NEGATIVE   Leukocytes,Ua LARGE (A) NEGATIVE   RBC / HPF 0-5 0 - 5 RBC/hpf   WBC, UA 21-50 0 - 5 WBC/hpf    Comment:        Reflex urine culture not performed if WBC <=10, OR if Squamous epithelial cells >5. If Squamous epithelial cells >5 suggest  recollection.    Bacteria, UA NONE SEEN NONE SEEN   Squamous Epithelial / HPF 0-5 0 - 5 /HPF    Comment: Performed at  [facility], [address] [place], [place] [address]   CT ABDOMEN PELVIS W CONTRAST  Result Date: 09/21/2022 CLINICAL DATA:  Acute abdominal pain EXAM: CT ABDOMEN AND PELVIS WITH CONTRAST TECHNIQUE: Multidetector CT imaging of the abdomen and pelvis was performed using the standard protocol following bolus administration of intravenous contrast. RADIATION DOSE REDUCTION: This exam was performed according to the departmental dose-optimization program which includes automated exposure control, adjustment of the mA and/or kV according to patient size and/or use of iterative reconstruction technique. CONTRAST:  OMNIPAQUE IOHEXOL 300 MG/ML  SOLN COMPARISON:  CT abdomen and pelvis 09/04/2022. MRI abdomen 09/05/2022. FINDINGS: Lower chest: There is a new small right pleural effusion. Left lower lobe mass measures 3.7 x 4.8 cm and appears unchanged. Hepatobiliary: Gallstones are again seen including a large gallstone measuring 4.3 cm. There is no biliary ductal dilatation. No focal liver lesions are seen. Pancreas: Hypodensity in the head of the pancreas measuring 7 mm image 2/34 appears unchanged. Focal hypodensity in the pancreatic tail measuring 15 x 8 mm image 2/22 is unchanged. No acute inflammation or ductal dilatation. Spleen: Normal in size without focal abnormality. Adrenals/Urinary Tract: Adrenal glands are unremarkable. Kidneys are normal, without renal calculi, focal lesion, or hydronephrosis. Bladder is unremarkable. Stomach/Bowel: Enhancing fluid collection abutting the greater curvature of the stomach is again seen and has increased in size now measuring 9.3 by 12.3 by 6.8 cm (previously 6.1 by 8.7 x 5.3 cm). Second smaller fluid collection adjacent to the gastric fundus image 2/17 measures 1.7 x 1.3 by 1.0 cm and appears similar to the prior study. The  stomach is otherwise within normal limits. There is no bowel obstruction, pneumatosis or free air. The appendix is not seen. There is sigmoid colon diverticulosis. Vascular/Lymphatic: Aortic atherosclerosis. No enlarged abdominal or pelvic lymph nodes. Reproductive: Status post hysterectomy. No adnexal masses. Other: There is a small amount of free fluid in the pelvis and right upper quadrant, unchanged. There is mild body wall edema, increased from prior. Musculoskeletal: Degenerative changes affect the spine and hips. Sclerotic density in the left femoral head is unchanged. IMPRESSION: 1. Enhancing fluid collection abutting the greater curvature of the stomach has mildly increased in size. 2. Stable small amount of free fluid in the abdomen and pelvis. 3. New small right pleural effusion. 4. Stable left lower lobe mass. 5. Stable stable hypodense lesions in the pancreas, unchanged from prior MRI. 6. Increased body wall edema. 7. Cholelithiasis. Aortic Atherosclerosis (ICD10-I70.0). Electronically Signed   By: Darliss Cheney M.D.   On: 09/21/2022 20:20    Pending Labs Unresulted Labs (From admission, onward)     Start     Ordered   09/22/22 0500  Lipase, blood  Tomorrow morning,   R        09/21/22 2217   09/21/22 2154  Urine Culture  Once,   R        09/21/22 2154            Vitals/Pain Today's Vitals   09/21/22 2038 09/21/22 2039 09/21/22 2148 09/21/22 2200  BP: (!) 142/80   (!) 154/81  Pulse: 73   68  Resp:    14  Temp:      TempSrc:      SpO2: 94%   91%  Weight:      Height:      PainSc:  8  7      Isolation Precautions No active isolations  Medications Medications  morphine (PF)  4 MG/ML injection 4 mg (4 mg Intravenous Given 09/21/22 1701)  lactated ringers bolus 2,000 mL (0 mLs Intravenous Stopped 09/21/22 1903)  ondansetron (ZOFRAN) injection 4 mg (4 mg Intravenous Given 09/21/22 1701)  iohexol (OMNIPAQUE) 300 MG/ML solution 100 mL (100 mLs Intravenous Contrast Given 09/21/22  1939)  HYDROmorphone (DILAUDID) injection 1 mg (1 mg Intravenous Given 09/21/22 1919)  ondansetron (ZOFRAN) injection 4 mg (4 mg Intravenous Given 09/21/22 1918)  morphine (PF) 4 MG/ML injection 4 mg (4 mg Intravenous Given 09/21/22 2149)  ondansetron (ZOFRAN) injection 4 mg (4 mg Intravenous Given 09/21/22 2149)    Mobility manual wheelchair     Focused Assessments Cardiac Assessment Handoff:    Lab Results  Component Value Date   CKTOTAL 111 04/08/2011   CKMB 2.0 04/08/2011   TROPONINI <0.30 04/08/2011   No results found for: "DDIMER" Does the Patient currently have chest pain? No    R Recommendations: See Admitting Provider Note  Report given to: Arta Bruce   Additional Notes: Monitor and treatment of symptoms

## 2022-09-22 ENCOUNTER — Inpatient Hospital Stay (HOSPITAL_COMMUNITY): Payer: BC Managed Care – PPO

## 2022-09-22 DIAGNOSIS — K8020 Calculus of gallbladder without cholecystitis without obstruction: Secondary | ICD-10-CM | POA: Diagnosis present

## 2022-09-22 DIAGNOSIS — Z85118 Personal history of other malignant neoplasm of bronchus and lung: Secondary | ICD-10-CM | POA: Diagnosis not present

## 2022-09-22 DIAGNOSIS — E1169 Type 2 diabetes mellitus with other specified complication: Secondary | ICD-10-CM | POA: Diagnosis not present

## 2022-09-22 DIAGNOSIS — Z87891 Personal history of nicotine dependence: Secondary | ICD-10-CM | POA: Diagnosis not present

## 2022-09-22 DIAGNOSIS — R569 Unspecified convulsions: Secondary | ICD-10-CM | POA: Diagnosis present

## 2022-09-22 DIAGNOSIS — E119 Type 2 diabetes mellitus without complications: Secondary | ICD-10-CM | POA: Diagnosis present

## 2022-09-22 DIAGNOSIS — Z79899 Other long term (current) drug therapy: Secondary | ICD-10-CM | POA: Diagnosis not present

## 2022-09-22 DIAGNOSIS — K625 Hemorrhage of anus and rectum: Secondary | ICD-10-CM | POA: Diagnosis not present

## 2022-09-22 DIAGNOSIS — Z833 Family history of diabetes mellitus: Secondary | ICD-10-CM | POA: Diagnosis not present

## 2022-09-22 DIAGNOSIS — J984 Other disorders of lung: Secondary | ICD-10-CM | POA: Diagnosis not present

## 2022-09-22 DIAGNOSIS — C3490 Malignant neoplasm of unspecified part of unspecified bronchus or lung: Secondary | ICD-10-CM | POA: Diagnosis not present

## 2022-09-22 DIAGNOSIS — C7931 Secondary malignant neoplasm of brain: Secondary | ICD-10-CM | POA: Diagnosis present

## 2022-09-22 DIAGNOSIS — K219 Gastro-esophageal reflux disease without esophagitis: Secondary | ICD-10-CM | POA: Diagnosis present

## 2022-09-22 DIAGNOSIS — K449 Diaphragmatic hernia without obstruction or gangrene: Secondary | ICD-10-CM | POA: Diagnosis not present

## 2022-09-22 DIAGNOSIS — B9689 Other specified bacterial agents as the cause of diseases classified elsewhere: Secondary | ICD-10-CM | POA: Diagnosis not present

## 2022-09-22 DIAGNOSIS — Z8261 Family history of arthritis: Secondary | ICD-10-CM | POA: Diagnosis not present

## 2022-09-22 DIAGNOSIS — Z91048 Other nonmedicinal substance allergy status: Secondary | ICD-10-CM | POA: Diagnosis not present

## 2022-09-22 DIAGNOSIS — I10 Essential (primary) hypertension: Secondary | ICD-10-CM | POA: Diagnosis present

## 2022-09-22 DIAGNOSIS — Z7984 Long term (current) use of oral hypoglycemic drugs: Secondary | ICD-10-CM | POA: Diagnosis not present

## 2022-09-22 DIAGNOSIS — R1012 Left upper quadrant pain: Secondary | ICD-10-CM | POA: Diagnosis not present

## 2022-09-22 DIAGNOSIS — Z825 Family history of asthma and other chronic lower respiratory diseases: Secondary | ICD-10-CM | POA: Diagnosis not present

## 2022-09-22 DIAGNOSIS — K2970 Gastritis, unspecified, without bleeding: Secondary | ICD-10-CM | POA: Diagnosis not present

## 2022-09-22 DIAGNOSIS — Z841 Family history of disorders of kidney and ureter: Secondary | ICD-10-CM | POA: Diagnosis not present

## 2022-09-22 DIAGNOSIS — K2289 Other specified disease of esophagus: Secondary | ICD-10-CM | POA: Diagnosis not present

## 2022-09-22 DIAGNOSIS — Z888 Allergy status to other drugs, medicaments and biological substances status: Secondary | ICD-10-CM | POA: Diagnosis not present

## 2022-09-22 DIAGNOSIS — K863 Pseudocyst of pancreas: Secondary | ICD-10-CM | POA: Diagnosis not present

## 2022-09-22 DIAGNOSIS — F411 Generalized anxiety disorder: Secondary | ICD-10-CM | POA: Diagnosis present

## 2022-09-22 DIAGNOSIS — R52 Pain, unspecified: Secondary | ICD-10-CM | POA: Diagnosis present

## 2022-09-22 DIAGNOSIS — Z8249 Family history of ischemic heart disease and other diseases of the circulatory system: Secondary | ICD-10-CM | POA: Diagnosis not present

## 2022-09-22 DIAGNOSIS — Z794 Long term (current) use of insulin: Secondary | ICD-10-CM | POA: Diagnosis not present

## 2022-09-22 LAB — CBC
HCT: 32.5 % — ABNORMAL LOW (ref 36.0–46.0)
Hemoglobin: 10.2 g/dL — ABNORMAL LOW (ref 12.0–15.0)
MCH: 31 pg (ref 26.0–34.0)
MCHC: 31.4 g/dL (ref 30.0–36.0)
MCV: 98.8 fL (ref 80.0–100.0)
Platelets: 155 10*3/uL (ref 150–400)
RBC: 3.29 MIL/uL — ABNORMAL LOW (ref 3.87–5.11)
RDW: 18 % — ABNORMAL HIGH (ref 11.5–15.5)
WBC: 10 10*3/uL (ref 4.0–10.5)
nRBC: 0 % (ref 0.0–0.2)

## 2022-09-22 LAB — COMPREHENSIVE METABOLIC PANEL
ALT: 31 U/L (ref 0–44)
AST: 16 U/L (ref 15–41)
Albumin: 2.7 g/dL — ABNORMAL LOW (ref 3.5–5.0)
Alkaline Phosphatase: 53 U/L (ref 38–126)
Anion gap: 9 (ref 5–15)
BUN: 20 mg/dL (ref 8–23)
CO2: 32 mmol/L (ref 22–32)
Calcium: 8.6 mg/dL — ABNORMAL LOW (ref 8.9–10.3)
Chloride: 98 mmol/L (ref 98–111)
Creatinine, Ser: 0.71 mg/dL (ref 0.44–1.00)
GFR, Estimated: >60 mL/min (ref >60)
Glucose, Bld: 151 mg/dL — ABNORMAL HIGH (ref 70–99)
Potassium: 3.2 mmol/L — ABNORMAL LOW (ref 3.5–5.1)
Sodium: 139 mmol/L (ref 135–145)
Total Bilirubin: 0.8 mg/dL (ref 0.3–1.2)
Total Protein: 5.4 g/dL — ABNORMAL LOW (ref 6.5–8.1)

## 2022-09-22 LAB — GLUCOSE, CAPILLARY
Glucose-Capillary: 116 mg/dL — ABNORMAL HIGH (ref 70–99)
Glucose-Capillary: 152 mg/dL — ABNORMAL HIGH (ref 70–99)
Glucose-Capillary: 285 mg/dL — ABNORMAL HIGH (ref 70–99)
Glucose-Capillary: 195 mg/dL — ABNORMAL HIGH (ref 70–99)

## 2022-09-22 LAB — LIPASE, BLOOD: Lipase: 177 U/L — ABNORMAL HIGH (ref 11–51)

## 2022-09-22 MED ORDER — HYDROMORPHONE HCL 2 MG PO TABS
2.0000 mg | ORAL_TABLET | ORAL | Status: DC | PRN
  Administered 2022-09-22 – 2022-09-26 (×18): 2 mg via ORAL
  Filled 2022-09-22 (×18): qty 1

## 2022-09-22 MED ORDER — CHLORHEXIDINE GLUCONATE CLOTH 2 % EX PADS
6.0000 | MEDICATED_PAD | Freq: Every day | CUTANEOUS | Status: DC
  Administered 2022-09-22 – 2022-09-30 (×9): 6 via TOPICAL

## 2022-09-22 NOTE — Progress Notes (Signed)
[facility] Progress Note  Patient: Carolyn Hooper     RUE:[id number]  DOA: 09/21/2022   PCP: [name], FNP       Brief hospital course: This is a 62 year old female with non-small cell lung cancer, brain metastasis, focal seizures, diabetes mellitus who presents to the hospital for abdominal pain. She was last hospitalized from 6/19-6/27 with epigastric pain. Sas found to have acute pancreatitis with a pseudocyst felt to be either gallstone pancreatitis versus secondary to immunotherapy.  The patient was started on Decadron due to the possibility of immunotherapy mediated pancreatitis.  She returns to the hospital for abdominal pain which is different from her prior episode of acute pancreatitis.  She describes the pain as dull and achy but stabbing at times.  She complains of on and off dysuria.  She has not had any episodes of nausea vomiting or diarrhea.  Of note on 7/5 she was evaluated by family medicine for vaginal pain-herpes simplex virus culture was ordered.  A wet prep trichomonas, yeast, clue cells was negative.  In the ED: Lipase was 170  CT abdomen pelvis revealed an enhancing fluid collection abutting the greater curvature of the stomach that was mildly increased in size, stable hypodense lesions in the pancreas unchanged from prior MRI. - Stable left lower lobe mass, new small right pleural effusion -Increased body wall edema -Cholelithiasis  Subjective:  Pain in left abdomen is constant and aching but sharp at times. It is mainly present on her left abdomen and radiation to her mid abdomen. No associated GI symptoms.  Oxycodone does not help at a dose of 5 mg. Dilaudid helps for about 30 min to an hour.   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 - Diagnosed in 11/23 - Status post SRS to brain metastasis which was completed in 1/24 - Given systemic chemotherapy-7 cycles in December 2023 - She is on maintenance treatment with Alimta and Keytruda every 3 weeks  History of seizures - Continue Keppra at 1000 mg twice daily  Recent acute pancreatitis with pseudocyst - She is on a high-dose prednisone taper which was started during her last admission for possible immunotherapy mediated pancreatitis -This has been weaned to 80 mg daily for 3 days -Creon will need to be resumed once she is started on a diet  Uncontrolled diabetes mellitus type 2 with hyperglycemia - Her last A1c was performed on 07/02/2022 and was 8.2 - She is receiving Semglee and NovoLog sliding scale -Metformin is on hold  Pyuria - Noted to have 20-50 WBCs in her urine and no bacteria - No need to treat  Anxiety - She takes clonazepam 0.5 mg twice daily     Code Status: Full Code Consultants: GI Level of Care: Level of care: Med-Surg Total time on patient care:  45 min DVT prophylaxis:  SCDs Start: 09/21/22 2309     Objective:   Vitals:   09/21/22 2200 09/21/22 2324 09/22/22 0351 09/22/22 0809  BP: (!) 154/81 (!) 173/85 (!) 143/57 (!) 134/56  Pulse: 68 70 69 65  Resp: 14 14 14 18   Temp:  98.6 F (37 C) 97.9 F (36.6 C) 97.7 F (36.5 C)  TempSrc:  Oral Oral Oral  SpO2: 91% 96% 97% 97%  Weight:  102.5 kg    Height:       Filed Weights   09/21/22 1542 09/21/22 2324  Weight: 99.8 kg 102.5 kg   Exam: General exam: Appears comfortable  HEENT: oral  mucosa moist Respiratory system: Clear to auscultation.  Cardiovascular system: S1 & S2 heard  Gastrointestinal system: Abdomen soft, tender in left upper abdomen and side of body, less tender in lower abdomen- firm swelling just above her pelvis on the left side of her body, abdomen is not distended, Normal bowel sounds   Extremities: No cyanosis, clubbing or edema Psychiatry:  Mood & affect appropriate.      CBC: Recent Labs  Lab 09/16/22 1415 09/21/22 1700 09/22/22 0423  WBC 11.9* 8.0 10.0  NEUTROABS 10.5* 7.1  --   HGB 11.4* 11.4* 10.2*  HCT 35.3* 35.6* 32.5*  MCV 98.6 98.6 98.8  PLT 119* 185 155   Basic Metabolic Panel: Recent Labs  Lab 09/16/22 1415 09/21/22 1700 09/22/22 0423  NA 140 136 139  K 4.1 3.8 3.2*  CL 102 98 98  CO2 27 27 32  GLUCOSE 231* 269* 151*  BUN 22 26* 20  CREATININE 0.81 0.79 0.71  CALCIUM 8.8* 8.7* 8.6*   GFR: Estimated Creatinine Clearance: 91.3 mL/min (by C-G formula based on SCr of 0.71 mg/dL).  Scheduled Meds:  Chlorhexidine Gluconate Cloth  6 each Topical Daily   famotidine  20 mg Oral BID   folic acid  1 mg Oral Daily   insulin aspart  0-15 Units Subcutaneous TID WC   insulin glargine-yfgn  15 Units Subcutaneous Daily   levETIRAcetam  1,000 mg Oral BID   pantoprazole  40 mg Oral QAC lunch   predniSONE  80 mg Oral Q breakfast   Ensure Max Protein  11 oz Oral BID   Continuous Infusions: Imaging and lab data was personally reviewed CT ABDOMEN PELVIS W CONTRAST  Result Date: 09/21/2022 CLINICAL DATA:  Acute abdominal pain EXAM: CT ABDOMEN AND PELVIS WITH CONTRAST TECHNIQUE: Multidetector CT imaging of the abdomen and pelvis was performed using the standard protocol following bolus administration of intravenous contrast. RADIATION DOSE REDUCTION: This exam was performed according to the departmental dose-optimization program which includes automated exposure control, adjustment of the mA and/or kV according to patient size and/or use  of iterative reconstruction technique. CONTRAST:  OMNIPAQUE IOHEXOL 300 MG/ML  SOLN COMPARISON:  CT abdomen and pelvis 09/04/2022. MRI abdomen 09/05/2022. FINDINGS: Lower chest: There is a new small right pleural effusion. Left lower lobe mass measures 3.7 x 4.8 cm and appears unchanged. Hepatobiliary: Gallstones are again seen including a large gallstone measuring 4.3 cm. There is no biliary ductal dilatation. No focal liver lesions are seen. Pancreas: Hypodensity in the head of the pancreas measuring 7 mm image 2/34 appears unchanged. Focal hypodensity in the pancreatic tail measuring 15 x 8 mm image 2/22 is unchanged. No acute inflammation or ductal dilatation. Spleen: Normal in size without focal abnormality. Adrenals/Urinary Tract: Adrenal glands are unremarkable. Kidneys are normal, without renal calculi, focal lesion, or  hydronephrosis. Bladder is unremarkable. Stomach/Bowel: Enhancing fluid collection abutting the greater curvature of the stomach is again seen and has increased in size now measuring 9.3 by 12.3 by 6.8 cm (previously 6.1 by 8.7 x 5.3 cm). Second smaller fluid collection adjacent to the gastric fundus image 2/17 measures 1.7 x 1.3 by 1.0 cm and appears similar to the prior study. The stomach is otherwise within normal limits. There is no bowel obstruction, pneumatosis or free air. The appendix is not seen. There is sigmoid colon diverticulosis. Vascular/Lymphatic: Aortic atherosclerosis. No enlarged abdominal or pelvic lymph nodes. Reproductive: Status post hysterectomy. No adnexal masses. Other: There is a small amount of free fluid in the pelvis and right upper quadrant, unchanged. There is mild body wall edema, increased from prior. Musculoskeletal: Degenerative changes affect the spine and hips. Sclerotic density in the left femoral head is unchanged. IMPRESSION: 1. Enhancing fluid collection abutting the greater curvature of the stomach has mildly increased in size. 2. Stable small  amount of free fluid in the abdomen and pelvis. 3. New small right pleural effusion. 4. Stable left lower lobe mass. 5. Stable stable hypodense lesions in the pancreas, unchanged from prior MRI. 6. Increased body wall edema. 7. Cholelithiasis. Aortic Atherosclerosis (ICD10-I70.0). Electronically Signed   By: Darliss Cheney M.D.   On: 09/21/2022 20:20    LOS: 0 days   Author: Calvert Cantor  09/22/2022 10:05 AM  To contact [facility]>   Check the care team in [facility] and look for the attending/consulting [facility] provider listed  Log into [email or web address] and use [place]'s universal password   Go to> "[facility]"  and find provider  If you still have difficulty reaching the provider, please page the [facility] (Director on Call) for the Hospitalists listed on amion

## 2022-09-22 NOTE — Consult Note (Signed)
Referring Provider: Dr. Artis Flock Primary Care Physician:  [name], FNP Primary Gastroenterologist:  Gentry Fitz  Reason for Consultation:  Abdominal pain; Pancreatitis  HPI: Carolyn Hooper is a 62 y.o. female with metastatic non-small cell lung cancer (brain mets) on chemotherapy/immunotherapy who has a history of pancreatitis with necrosis and pseudocyst formation last month on Creon came in to the ER yesterday with worsening abdominal pain. Abdominal pain fluctuates from sharp to dull that radiates throughout her abdomen. Denies N/V/F/C. Denies rectal bleeding. Good PO intake until yesterday when abdominal pain worsened. Lipase 177 (347 on 09/12/22). CT NEG for acute pancreatic inflammation. Fluid collection adjacent to greater curvature of stomach has increased in size.  Past Medical History:  Diagnosis Date   Abscess    hydroadenitis superativa   Anxiety    DM (diabetes mellitus) (HCC)    Hyperinsulinemia    Hypertension 1983   gestational - pre-eclampsia   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[facility])    Neuroma    audiotry neuroma   Pancreatitis    Seizure (HCC)    Vertigo     Past Surgical History:  Procedure Laterality Date   ABDOMINAL HYSTERECTOMY  2006   remaining left ovary   BRONCHIAL BIOPSY  02/19/2022   Procedure: BRONCHIAL BIOPSIES;  Surgeon: Josephine Igo, DO;  Location: [facility];  Service: Pulmonary;;   BRONCHIAL BRUSHINGS  02/19/2022   Procedure: BRONCHIAL BRUSHINGS;  Surgeon: Josephine Igo, DO;  Location: [facility];  Service: Pulmonary;;   BRONCHIAL NEEDLE ASPIRATION BIOPSY  02/19/2022   Procedure: BRONCHIAL NEEDLE ASPIRATION BIOPSIES;  Surgeon: Josephine Igo, DO;  Location: [facility];  Service: Pulmonary;;   IR IMAGING GUIDED PORT INSERTION  03/29/2022   SHOULDER SURGERY Right 1960s    Prior to Admission medications   Medication Sig Start Date End Date Taking? Authorizing Provider  albuterol (VENTOLIN HFA) 108 (90 Base) MCG/ACT inhaler Inhale 2  puffs into the lungs every 6 (six) hours as needed. 01/08/21  Yes Hawks, [name] A, FNP  clonazePAM (KLONOPIN) 0.5 MG tablet Take 1 tablet (0.5 mg total) by mouth 2 (two) times daily. Patient taking differently: Take 0.5 mg by mouth 2 (two) times daily as needed for anxiety. 08/30/22  Yes Hawks, Edilia Bo, FNP  Ensure Max Protein (ENSURE MAX PROTEIN) LIQD Take 330 mLs (11 oz total) by mouth 2 (two) times daily. 09/11/22  Yes Hongalgi, Maximino Greenland, MD  fluconazole (DIFLUCAN) 150 MG tablet Take 1 tablet by mouth every 3 days as needed (yeast infection). 08/29/22  Yes Hawks, Edilia Bo, FNP  folic acid (FOLVITE) 1 MG tablet Take 1 tablet by mouth once daily 09/16/22  Yes Heilingoetter, [name], PA-C  furosemide (LASIX) 20 MG tablet Take 1 tablet (20 mg total) by mouth daily as needed for fluid or edema. 09/20/22  Yes Hawks, [name] A, FNP  insulin glargine (LANTUS SOLOSTAR) 100 UNIT/ML Solostar Pen Inject 15 Units into the skin daily. 09/12/22  Yes Hongalgi, Maximino Greenland, MD  levETIRAcetam (KEPPRA) 1000 MG tablet Take 1 tablet (1,000 mg total) by mouth 2 (two) times daily. 07/31/22  Yes Vaslow, Georgeanna Lea, MD  lipase/protease/amylase (CREON) 36000 UNITS CPEP capsule Take 36,000-72,000 Units by mouth as directed. Take 2 Capsules (72,000 units) with meals and Take 1 Capsule (36,000 units) with snacks.   Yes [provider]  metFORMIN (GLUCOPHAGE) 1000 MG tablet Take 1 tablet by mouth 2 times daily. 09/16/22  Yes Hawks, [name] A, FNP  ondansetron (ZOFRAN) 4 MG tablet Take 1  tablet (4 mg total) by mouth every 6 (six) hours as needed for nausea. Patient taking differently: Take 4-8 mg by mouth every 6 (six) hours as needed for nausea. 07/17/22  Yes [name], [name], MD  oxyCODONE (OXY IR/ROXICODONE) 5 MG immediate release tablet Take 1 tablet (5 mg total) by mouth every 8 (eight) hours as needed for moderate pain or severe pain. 09/20/22  Yes Hawks, [name] A, FNP  pantoprazole (PROTONIX) 40 MG tablet Take 1 tablet (40  mg total) by mouth daily. 09/13/22  Yes Hongalgi, Maximino Greenland, MD  predniSONE (DELTASONE) 20 MG tablet Take 5 tablets daily x 5 days THEN take 4 tablets daily x 7 days THEN 3 tablets daily x 7 days THEN take 2 tablets daily x 7 days THEN take 1 tablet daily x 7 days THEN take 1/2 tablet daily x 7 days.  Follow-up with oncology MD regarding need for change in regimen. 09/12/22  Yes Hongalgi, Maximino Greenland, MD  prochlorperazine (COMPAZINE) 10 MG tablet Take 1 tablet (10 mg total) by mouth every 6 (six) hours as needed. Patient taking differently: Take 10 mg by mouth every 6 (six) hours as needed for nausea or vomiting. 05/30/22  Yes Heilingoetter, [name], PA-C  triamcinolone ointment (KENALOG) 0.5 % Apply topically to affected area twice daily as directed, 07/30/22  Yes Johnson, Clanford L, MD  TYLENOL 500 MG tablet Take 1,000 mg by mouth every 6 (six) hours as needed for mild pain or headache.   Yes [provider]  famotidine (PEPCID) 20 MG tablet Take 1 tablet (20 mg total) by mouth 2 (two) times daily. 08/22/22   [name], MD  Insulin Pen Needle (PEN NEEDLES) 32G X 4 MM MISC Use to inject insulin daily as directed. 07/09/22   [name], FNP  lidocaine (XYLOCAINE) 5 % ointment Apply topically as needed. Patient taking differently: Apply 1 Application topically as needed for mild pain. 05/30/22   [name], FNP  lidocaine-prilocaine (EMLA) cream Apply to port prior to access 04/25/22   [name], MD  MYLANTA MAXIMUM STRENGTH [phone] MG/5ML suspension Take 15 mLs by mouth See admin instructions. Take 15 ml's by mouth midday and bedtime    [provider]  oxycodone (OXY-IR) 5 MG capsule Take 1 capsule (5 mg total) by mouth every 6 (six) hours as needed. 10/18/22   [name], FNP  oxycodone (OXY-IR) 5 MG capsule Take 1 capsule (5 mg total) by mouth every 6 (six) hours as needed. 11/14/22   [name], FNP    Scheduled Meds:  Chlorhexidine Gluconate Cloth  6  each Topical Daily   famotidine  20 mg Oral BID   folic acid  1 mg Oral Daily   insulin aspart  0-15 Units Subcutaneous TID WC   insulin glargine-yfgn  15 Units Subcutaneous Daily   levETIRAcetam  1,000 mg Oral BID   pantoprazole  40 mg Oral QAC lunch   predniSONE  80 mg Oral Q breakfast   Ensure Max Protein  11 oz Oral BID   Continuous Infusions: PRN Meds:.acetaminophen **OR** acetaminophen, clonazePAM, HYDROmorphone (DILAUDID) injection, HYDROmorphone, metoprolol tartrate, ondansetron **OR** ondansetron (ZOFRAN) IV  Allergies as of 09/21/2022 - Review Complete 09/21/2022  Allergen Reaction Noted   Adhesive [tape] Dermatitis 04/07/2011   Statins Itching and Other (See Comments) 01/08/2021    Family History  Problem Relation Age of Onset   COPD Mother    Heart disease Father    Diabetes Father  Kidney disease Father    Arthritis Father        RA   Liver disease Father     Social History   Socioeconomic History   Marital status: Divorced    Spouse name: Not on file   Number of children: Not on file   Years of education: Not on file   Highest education level: Not on file  Occupational History   Not on file  Tobacco Use   Smoking status: Former    Packs/day: .75    Types: Cigarettes    Quit date: 02/11/2022    Years since quitting: 0.6   Smokeless tobacco: Never  Vaping Use   Vaping Use: Never used  Substance and Sexual Activity   Alcohol use: Yes    Comment: very rare   Drug use: No   Sexual activity: Not on file  Other Topics Concern   Not on file  Social History Narrative   Not on file   Social Determinants of Health   Financial Resource Strain: Not on file  Food Insecurity: No Food Insecurity (09/21/2022)   Hunger Vital Sign    Worried About Running Out of Food in the Last Year: Never true    Ran Out of Food in the Last Year: Never true  Transportation Needs: No Transportation Needs (09/21/2022)   PRAPARE - Administrator, Civil Service  (Medical): No    Lack of Transportation (Non-Medical): No  Physical Activity: Not on file  Stress: Not on file  Social Connections: Not on file  Intimate Partner Violence: Not At Risk (09/21/2022)   Humiliation, Afraid, Rape, and Kick questionnaire    Fear of Current or Ex-Partner: No    Emotionally Abused: No    Physically Abused: No    Sexually Abused: No    Review of Systems: All negative except as stated above in HPI.  Physical Exam: Vital signs: Vitals:   09/22/22 0809 09/22/22 1208  BP: (!) 134/56 127/80  Pulse: 65 62  Resp: 18 18  Temp: 97.7 F (36.5 C) 97.9 F (36.6 C)  SpO2: 97% 96%   Last BM Date : (S)  (PTA) General:   Lethargic, chronically ill-appearing, obese, no acute distress, pleasant Head: normocephalic, atraumatic Eyes: anicteric sclera ENT: oropharynx clear Neck: supple, nontender Lungs:  Clear throughout to auscultation.   No wheezes, crackles, or rhonchi. No acute distress. Heart:  Regular rate and rhythm; no murmurs, clicks, rubs,  or gallops. Abdomen: diffuse tenderness with guarding, soft, nondistended, +BS  Rectal:  Deferred Ext: no edema  GI:  Lab Results: Recent Labs    09/21/22 1700 09/22/22 0423  WBC 8.0 10.0  HGB 11.4* 10.2*  HCT 35.6* 32.5*  PLT 185 155   BMET Recent Labs    09/21/22 1700 09/22/22 0423  NA 136 139  K 3.8 3.2*  CL 98 98  CO2 27 32  GLUCOSE 269* 151*  BUN 26* 20  CREATININE 0.79 0.71  CALCIUM 8.7* 8.6*   LFT Recent Labs    09/22/22 0423  PROT 5.4*  ALBUMIN 2.7*  AST 16  ALT 31  ALKPHOS 53  BILITOT 0.8   PT/INR No results for input(s): "LABPROT", "INR" in the last 72 hours.   Studies/Results: CT ABDOMEN PELVIS W CONTRAST  Result Date: 09/21/2022 CLINICAL DATA:  Acute abdominal pain EXAM: CT ABDOMEN AND PELVIS WITH CONTRAST TECHNIQUE: Multidetector CT imaging of the abdomen and pelvis was performed using the standard protocol following bolus administration of intravenous  contrast. RADIATION DOSE  REDUCTION: This exam was performed according to the departmental dose-optimization program which includes automated exposure control, adjustment of the mA and/or kV according to patient size and/or use of iterative reconstruction technique. CONTRAST:  OMNIPAQUE IOHEXOL 300 MG/ML  SOLN COMPARISON:  CT abdomen and pelvis 09/04/2022. MRI abdomen 09/05/2022. FINDINGS: Lower chest: There is a new small right pleural effusion. Left lower lobe mass measures 3.7 x 4.8 cm and appears unchanged. Hepatobiliary: Gallstones are again seen including a large gallstone measuring 4.3 cm. There is no biliary ductal dilatation. No focal liver lesions are seen. Pancreas: Hypodensity in the head of the pancreas measuring 7 mm image 2/34 appears unchanged. Focal hypodensity in the pancreatic tail measuring 15 x 8 mm image 2/22 is unchanged. No acute inflammation or ductal dilatation. Spleen: Normal in size without focal abnormality. Adrenals/Urinary Tract: Adrenal glands are unremarkable. Kidneys are normal, without renal calculi, focal lesion, or hydronephrosis. Bladder is unremarkable. Stomach/Bowel: Enhancing fluid collection abutting the greater curvature of the stomach is again seen and has increased in size now measuring 9.3 by 12.3 by 6.8 cm (previously 6.1 by 8.7 x 5.3 cm). Second smaller fluid collection adjacent to the gastric fundus image 2/17 measures 1.7 x 1.3 by 1.0 cm and appears similar to the prior study. The stomach is otherwise within normal limits. There is no bowel obstruction, pneumatosis or free air. The appendix is not seen. There is sigmoid colon diverticulosis. Vascular/Lymphatic: Aortic atherosclerosis. No enlarged abdominal or pelvic lymph nodes. Reproductive: Status post hysterectomy. No adnexal masses. Other: There is a small amount of free fluid in the pelvis and right upper quadrant, unchanged. There is mild body wall edema, increased from prior. Musculoskeletal: Degenerative changes affect the spine  and hips. Sclerotic density in the left femoral head is unchanged. IMPRESSION: 1. Enhancing fluid collection abutting the greater curvature of the stomach has mildly increased in size. 2. Stable small amount of free fluid in the abdomen and pelvis. 3. New small right pleural effusion. 4. Stable left lower lobe mass. 5. Stable stable hypodense lesions in the pancreas, unchanged from prior MRI. 6. Increased body wall edema. 7. Cholelithiasis. Aortic Atherosclerosis (ICD10-I70.0). Electronically Signed   By: Darliss Cheney M.D.   On: 09/21/2022 20:20    Impression/Plan: Worsened diffuse abdominal pain in the setting of recent diagnosis (June) of complicated acute pancreatitis with necrosis and pseudocyst formation. Lipase has improved since discharge last month. Her abdominal pain is diffuse and NOT consistent with acute pancreatitis. Large fluid collection near stomach at increased risk for causing gastric outlet obstruction and will need to be followed with periodic imaging. Supportive care. Continue clear liquid diet today and if stable slowly advance tomorrow. Dr. Ewing Schlein will f/u for [facility] tomorrow.    LOS: 0 days   [name]  09/22/2022, 12:15 PM  Questions please call [phone]

## 2022-09-23 ENCOUNTER — Other Ambulatory Visit: Payer: Self-pay | Admitting: Physician Assistant

## 2022-09-23 ENCOUNTER — Other Ambulatory Visit (HOSPITAL_COMMUNITY): Payer: Self-pay

## 2022-09-23 ENCOUNTER — Inpatient Hospital Stay: Payer: BC Managed Care – PPO

## 2022-09-23 ENCOUNTER — Ambulatory Visit (HOSPITAL_COMMUNITY)
Admission: RE | Admit: 2022-09-23 | Discharge: 2022-09-23 | Disposition: A | Discharge status: Home / Self Care | Payer: BC Managed Care – PPO | Source: Ambulatory Visit | Attending: Physician Assistant | Admitting: Physician Assistant

## 2022-09-23 ENCOUNTER — Other Ambulatory Visit: Payer: Self-pay | Admitting: Family

## 2022-09-23 DIAGNOSIS — R1012 Left upper quadrant pain: Secondary | ICD-10-CM | POA: Diagnosis not present

## 2022-09-23 LAB — GLUCOSE, CAPILLARY
Glucose-Capillary: 163 mg/dL — ABNORMAL HIGH (ref 70–99)
Glucose-Capillary: 195 mg/dL — ABNORMAL HIGH (ref 70–99)
Glucose-Capillary: 317 mg/dL — ABNORMAL HIGH (ref 70–99)
Glucose-Capillary: 196 mg/dL — ABNORMAL HIGH (ref 70–99)

## 2022-09-23 LAB — URINE CULTURE

## 2022-09-23 LAB — HERPES SIMPLEX VIRUS CULTURE

## 2022-09-23 MED ORDER — PANCRELIPASE (LIP-PROT-AMYL) 12000-38000 UNITS PO CPEP
12000.0000 [IU] | ORAL_CAPSULE | Freq: Three times a day (TID) | ORAL | Status: DC | PRN
  Administered 2022-09-29: 12000 [IU] via ORAL
  Filled 2022-09-23 (×2): qty 1

## 2022-09-23 MED ORDER — PANCRELIPASE (LIP-PROT-AMYL) 12000-38000 UNITS PO CPEP
24000.0000 [IU] | ORAL_CAPSULE | Freq: Three times a day (TID) | ORAL | Status: DC
  Administered 2022-09-23 – 2022-09-30 (×19): 24000 [IU] via ORAL
  Filled 2022-09-23 (×18): qty 2

## 2022-09-23 MED ORDER — VALACYCLOVIR HCL 500 MG PO TABS
500.0000 mg | ORAL_TABLET | Freq: Two times a day (BID) | ORAL | 2 refills | Status: DC
  Filled 2022-09-23: qty 60, 30d supply, fill #0

## 2022-09-23 MED ORDER — POLYETHYLENE GLYCOL 3350 17 G PO PACK
17.0000 g | PACK | Freq: Every day | ORAL | Status: DC | PRN
  Administered 2022-09-25 – 2022-09-27 (×2): 17 g via ORAL
  Filled 2022-09-23 (×2): qty 1

## 2022-09-23 MED ORDER — SENNA 8.6 MG PO TABS
2.0000 | ORAL_TABLET | Freq: Every evening | ORAL | Status: DC | PRN
  Administered 2022-09-24: 17.2 mg via ORAL
  Filled 2022-09-23: qty 2

## 2022-09-23 NOTE — Progress Notes (Signed)
[facility] Progress Note  Patient: Carolyn Hooper     WUJ:[id number]  DOA: 09/21/2022   PCP: [name], FNP       Brief hospital course: This is a 62 year old female with non-small cell lung cancer, brain metastasis, focal seizures, diabetes mellitus who presents to the hospital for abdominal pain. She was last hospitalized from 6/19-6/27 with epigastric pain. Sas found to have acute pancreatitis with a pseudocyst felt to be either gallstone pancreatitis versus secondary to immunotherapy.  The patient was started on Decadron due to the possibility of immunotherapy mediated pancreatitis.  She returns to the hospital for abdominal pain which is different from her prior episode of acute pancreatitis.  She describes the pain as dull and achy but stabbing at times.  She complains of on and off dysuria.  She has not had any episodes of nausea vomiting or diarrhea.  Of note on 7/5 she was evaluated by family medicine for vaginal pain-herpes simplex virus culture was ordered.  A wet prep trichomonas, yeast, clue cells was negative.  In the ED: Lipase was 170  CT abdomen pelvis revealed an enhancing fluid collection abutting the greater curvature of the stomach that was mildly increased in size, stable hypodense lesions in the pancreas unchanged from prior MRI. - Stable left lower lobe mass, new small right pleural effusion -Increased body wall edema -Cholelithiasis  Subjective:  Ongoing pain through out her abdomen today. Dilaudid seems to be helping it a little.   Assessment and Plan: Principal Problem:   Abdominal pain - mostly diffuse pain but much worse in her upper lateral abdomen bilaterally - unable to give NSAIDS due to h/o brain masses and risk of bleeding - per GI, she may benefit from an EUS and pancreatic cyst drainage - can continue oral Dilaudid which appears to be work  Active Problems:      Non-small cell lung cancer with metastasis to the brain -  Diagnosed in 11/23 - Status post SRS to brain metastasis which was completed in 1/24 - Given systemic chemotherapy-7 cycles in December 2023 - She is on maintenance treatment with Alimta and Keytruda every 3 weeks  History of seizures - Continue Keppra at 1000 mg twice daily  Recent acute pancreatitis with pseudocyst - She is on a high-dose prednisone taper which was started during her last admission for possible immunotherapy mediated pancreatitis -This has been weaned to 80 mg daily for 3 days -Creon has been resume as diet has been advance to solids  Uncontrolled diabetes mellitus type 2 with hyperglycemia - Her last A1c was performed on 07/02/2022 and was 8.2 - She is receiving Semglee and NovoLog sliding scale -Metformin is on hold  Pyuria - Noted to have 20-50 WBCs in her urine and no bacteria - No need to treat  Anxiety - She takes clonazepam 0.5 mg twice daily     Code Status: Full Code Consultants: GI Level of Care: Level of care: Med-Surg Total time on patient care: 45 min DVT prophylaxis:  SCDs Start: 09/21/22 2309     Objective:   Vitals:   09/22/22 1340 09/22/22 2128 09/23/22 0457 09/23/22 1357  BP: 125/61 131/63 (!) 136/50 131/63  Pulse: 61 (!) 59  70  Resp: 18 14 14 15   Temp: 98.4 F (36.9 C) 97.8 F (36.6 C) 97.7 F (36.5 C) 97.7 F (36.5 C)  TempSrc: Oral Oral Oral Oral  SpO2: 96% 98% 99% 94%  Weight:   103.4 kg   Height:  Filed Weights   09/21/22 1542 09/21/22 2324 09/23/22 0457  Weight: 99.8 kg 102.5 kg 103.4 kg   Exam: General exam: Appears comfortable  HEENT: oral mucosa moist Respiratory system: Clear to auscultation.  Cardiovascular system: S1 & S2 heard  Gastrointestinal system: diffuse tenderness in her abdomen Extremities: No cyanosis, clubbing or edema Psychiatry:  Mood & affect appropriate.      CBC: Recent Labs  Lab 09/21/22 1700 09/22/22 0423  WBC 8.0 10.0  NEUTROABS 7.1  --   HGB 11.4* 10.2*  HCT 35.6* 32.5*   MCV 98.6 98.8  PLT 185 155    Basic Metabolic Panel: Recent Labs  Lab 09/21/22 1700 09/22/22 0423  NA 136 139  K 3.8 3.2*  CL 98 98  CO2 27 32  GLUCOSE 269* 151*  BUN 26* 20  CREATININE 0.79 0.71  CALCIUM 8.7* 8.6*    GFR: Estimated Creatinine Clearance: 91.7 mL/min (by C-G formula based on SCr of 0.71 mg/dL).  Scheduled Meds:  Chlorhexidine Gluconate Cloth  6 each Topical Daily   famotidine  20 mg Oral BID   folic acid  1 mg Oral Daily   insulin aspart  0-15 Units Subcutaneous TID WC   insulin glargine-yfgn  15 Units Subcutaneous Daily   levETIRAcetam  1,000 mg Oral BID   lipase/protease/amylase  24,000 Units Oral TID WC   pantoprazole  40 mg Oral QAC lunch   predniSONE  80 mg Oral Q breakfast   Ensure Max Protein  11 oz Oral BID   Continuous Infusions: Imaging and lab data was personally reviewed [place] CHEST SOFT TISSUE  Result Date: 09/22/2022 CLINICAL DATA:  Mass left abdomen EXAM: LIMITED ULTRASOUND OF ABDOMINAL SOFT TISSUES TECHNIQUE: Ultrasound examination of the abdominal wall soft tissues was performed in the area of clinical concern. COMPARISON:  Abdominal CT 09/21/2022, MRI 09/05/2022 FINDINGS: No superficial abnormality noted. The fluid collection noted along the greater curvature of the stomach is again seen as noted on prior CT measuring 12.0 x 12.6 x 4.6 cm. This appears simple. IMPRESSION: 12.6 cm fluid collection along the greater curvature of the stomach as seen on prior CT and MRI. This is most compatible with acute pancreatic fluid collection/developing pseudo cyst. Electronically Signed   By: Charlett Nose M.D.   On: 09/22/2022 18:02   CT ABDOMEN PELVIS W CONTRAST  Result Date: 09/21/2022 CLINICAL DATA:  Acute abdominal pain EXAM: CT ABDOMEN AND PELVIS WITH CONTRAST TECHNIQUE: Multidetector CT imaging of the abdomen and pelvis was performed using the standard protocol following bolus administration of intravenous contrast. RADIATION DOSE REDUCTION: This exam  was performed according to the departmental dose-optimization program which includes automated exposure control, adjustment of the mA and/or kV according to patient size and/or use of iterative reconstruction technique. CONTRAST:  OMNIPAQUE IOHEXOL 300 MG/ML  SOLN COMPARISON:  CT abdomen and pelvis 09/04/2022. MRI abdomen 09/05/2022. FINDINGS: Lower chest: There is a new small right pleural effusion. Left lower lobe mass measures 3.7 x 4.8 cm and appears unchanged. Hepatobiliary: Gallstones are again seen including a large gallstone measuring 4.3 cm. There is no biliary ductal dilatation. No focal liver lesions are seen. Pancreas: Hypodensity in the head of the pancreas measuring 7 mm image 2/34 appears unchanged. Focal hypodensity in the pancreatic tail measuring 15 x 8 mm image 2/22 is unchanged. No acute inflammation or ductal dilatation. Spleen: Normal in size without focal abnormality. Adrenals/Urinary Tract: Adrenal glands are unremarkable. Kidneys are normal, without renal calculi, focal lesion, or hydronephrosis. Bladder  is unremarkable. Stomach/Bowel: Enhancing fluid collection abutting the greater curvature of the stomach is again seen and has increased in size now measuring 9.3 by 12.3 by 6.8 cm (previously 6.1 by 8.7 x 5.3 cm). Second smaller fluid collection adjacent to the gastric fundus image 2/17 measures 1.7 x 1.3 by 1.0 cm and appears similar to the prior study. The stomach is otherwise within normal limits. There is no bowel obstruction, pneumatosis or free air. The appendix is not seen. There is sigmoid colon diverticulosis. Vascular/Lymphatic: Aortic atherosclerosis. No enlarged abdominal or pelvic lymph nodes. Reproductive: Status post hysterectomy. No adnexal masses. Other: There is a small amount of free fluid in the pelvis and right upper quadrant, unchanged. There is mild body wall edema, increased from prior. Musculoskeletal: Degenerative changes affect the spine and hips. Sclerotic  density in the left femoral head is unchanged. IMPRESSION: 1. Enhancing fluid collection abutting the greater curvature of the stomach has mildly increased in size. 2. Stable small amount of free fluid in the abdomen and pelvis. 3. New small right pleural effusion. 4. Stable left lower lobe mass. 5. Stable stable hypodense lesions in the pancreas, unchanged from prior MRI. 6. Increased body wall edema. 7. Cholelithiasis. Aortic Atherosclerosis (ICD10-I70.0). Electronically Signed   By: Darliss Cheney M.D.   On: 09/21/2022 20:20    LOS: 1 day   Author: Calvert Cantor  09/23/2022 3:53 PM  To contact [facility]>   Check the care team in [facility] and look for the attending/consulting [facility] provider listed  Log into [email or web address] and use [place]'s universal password   Go to> "[facility]"  and find provider  If you still have difficulty reaching the provider, please page the [facility] (Director on Call) for the Hospitalists listed on amion

## 2022-09-23 NOTE — Progress Notes (Signed)
RAYNIYAH OVERTURF 12:06 PM  Subjective: Patient seen and examined and case discussed with my partner Dr. Bosie Clos and her hospital computer chart reviewed and patient familiar to me from her recent hospital stay few weeks ago and she was okay at home although could not say whether the pancreatic enzymes were helping or not but did have increasing general abdominal pain yesterday but today it seems to be mostly in the left upper quadrant and she has not had any problems with her bowels or nausea vomiting or fever and no other complaints  Objective: Vital signs stable afebrile no acute distress her pain is mostly mid epigastric left upper quadrant nontender lower labs reviewed follow-up CT reviewed  Assessment: Abdominal pain probably from her pseudocyst  Plan: I have reviewed the case with Dr. Meridee Score and hopefully he can proceed on Thursday with EUS and possible cyst drainage and he will probably know tomorrow and in the meantime continue present management and I will check on tomorrow  [facility]  office [phone] After 5PM or if no answer call [phone]

## 2022-09-23 NOTE — Progress Notes (Deleted)
[facility] OFFICE PROGRESS NOTE  Carolyn Spencer, FNP [address] [place] [address]  DIAGNOSIS: ***  PRIOR THERAPY:  CURRENT THERAPY:  INTERVAL HISTORY: [facility] 62 y.o. female returns for *** regular *** visit for followup of ***   MEDICAL HISTORY: Past Medical History:  Diagnosis Date   Abscess    hydroadenitis superativa   Anxiety    DM (diabetes mellitus) (HCC)    Hyperinsulinemia    Hypertension 1983   gestational - pre-eclampsia   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[facility])    Neuroma    audiotry neuroma   Pancreatitis    Seizure (HCC)    Vertigo     ALLERGIES:  is allergic to adhesive [tape] and statins.  MEDICATIONS:  No current facility-administered medications for this visit.   No current outpatient medications on file.   Facility-Administered Medications Ordered in Other Visits  Medication Dose Route Frequency Provider Last Rate Last Admin   acetaminophen (TYLENOL) tablet 650 mg  650 mg Oral Q6H PRN Orland Mustard, MD   650 mg at 09/22/22 0310   Or   acetaminophen (TYLENOL) suppository 650 mg  650 mg Rectal Q6H PRN Orland Mustard, MD       Chlorhexidine Gluconate Cloth 2 % PADS 6 each  6 each Topical Daily Orland Mustard, MD   6 each at 09/23/22 1131   clonazePAM (KLONOPIN) tablet 0.5 mg  0.5 mg Oral BID PRN Orland Mustard, MD       famotidine (PEPCID) tablet 20 mg  20 mg Oral BID Orland Mustard, MD   20 mg at 09/23/22 6045   folic acid (FOLVITE) tablet 1 mg  1 mg Oral Daily Orland Mustard, MD   1 mg at 09/23/22 4098   HYDROmorphone (DILAUDID) injection 0.5-1 mg  0.5-1 mg Intravenous Q3H PRN Orland Mustard, MD   0.5 mg at 09/22/22 0002   HYDROmorphone (DILAUDID) tablet 2 mg  2 mg Oral Q4H PRN Calvert Cantor, MD   2 mg at 09/23/22 1259   insulin aspart (novoLOG) injection 0-15 Units  0-15 Units Subcutaneous TID WC Orland Mustard, MD   3 Units at 09/23/22 1246   insulin glargine-yfgn (SEMGLEE) injection 15 Units  15 Units  Subcutaneous Daily Orland Mustard, MD   15 Units at 09/23/22 1191   levETIRAcetam (KEPPRA) tablet 1,000 mg  1,000 mg Oral BID Orland Mustard, MD   1,000 mg at 09/23/22 4782   lipase/protease/amylase (CREON) capsule 12,000 Units  12,000 Units Oral TID PRN Calvert Cantor, MD       lipase/protease/amylase (CREON) capsule 24,000 Units  24,000 Units Oral TID WC Calvert Cantor, MD   24,000 Units at 09/23/22 1136   metoprolol tartrate (LOPRESSOR) injection 2.5 mg  2.5 mg Intravenous Q6H PRN Orland Mustard, MD       ondansetron [facility]) tablet 4 mg  4 mg Oral Q6H PRN Orland Mustard, MD       Or   ondansetron [facility]) injection 4 mg  4 mg Intravenous Q6H PRN Orland Mustard, MD       pantoprazole (PROTONIX) EC tablet 40 mg  40 mg Oral QAC lunch Orland Mustard, MD   40 mg at 09/23/22 1136   predniSONE (DELTASONE) tablet 80 mg  80 mg Oral Q breakfast Orland Mustard, MD   80 mg at 09/23/22 9562   protein supplement (ENSURE MAX) liquid  11 oz Oral BID Orland Mustard, MD   11 oz at 09/22/22 2126    SURGICAL HISTORY:  Past Surgical History:  Procedure Laterality Date   ABDOMINAL HYSTERECTOMY  2006   remaining left ovary   BRONCHIAL BIOPSY  02/19/2022   Procedure: BRONCHIAL BIOPSIES;  Surgeon: Josephine Igo, DO;  Location: [facility];  Service: Pulmonary;;   BRONCHIAL BRUSHINGS  02/19/2022   Procedure: BRONCHIAL BRUSHINGS;  Surgeon: Josephine Igo, DO;  Location: [facility];  Service: Pulmonary;;   BRONCHIAL NEEDLE ASPIRATION BIOPSY  02/19/2022   Procedure: BRONCHIAL NEEDLE ASPIRATION BIOPSIES;  Surgeon: Josephine Igo, DO;  Location: [facility];  Service: Pulmonary;;   IR IMAGING GUIDED PORT INSERTION  03/29/2022   SHOULDER SURGERY Right 1960s    REVIEW OF SYSTEMS:   Review of Systems  Constitutional: Negative for appetite change, chills, fatigue, fever and unexpected weight change.  HENT:   Negative for mouth sores, nosebleeds, sore throat and trouble swallowing.   Eyes: Negative for eye  problems and icterus.  Respiratory: Negative for cough, hemoptysis, shortness of breath and wheezing.   Cardiovascular: Negative for chest pain and leg swelling.  Gastrointestinal: Negative for abdominal pain, constipation, diarrhea, nausea and vomiting.  Genitourinary: Negative for bladder incontinence, difficulty urinating, dysuria, frequency and hematuria.   Musculoskeletal: Negative for back pain, gait problem, neck pain and neck stiffness.  Skin: Negative for itching and rash.  Neurological: Negative for dizziness, extremity weakness, gait problem, headaches, light-headedness and seizures.  Hematological: Negative for adenopathy. Does not bruise/bleed easily.  Psychiatric/Behavioral: Negative for confusion, depression and sleep disturbance. The patient is not nervous/anxious.     PHYSICAL EXAMINATION:  There were no vitals taken for this visit.  ECOG PERFORMANCE STATUS: {CHL ONC ECOG [id number]  Physical Exam  Constitutional: Oriented to person, place, and time and well-developed, well-nourished, and in no distress. No distress.  HENT:  Head: Normocephalic and atraumatic.  Mouth/Throat: Oropharynx is clear and moist. No oropharyngeal exudate.  Eyes: Conjunctivae are normal. Right eye exhibits no discharge. Left eye exhibits no discharge. No scleral icterus.  Neck: Normal range of motion. Neck supple.  Cardiovascular: Normal rate, regular rhythm, normal heart sounds and intact distal pulses.   Pulmonary/Chest: Effort normal and breath sounds normal. No respiratory distress. No wheezes. No rales.  Abdominal: Soft. Bowel sounds are normal. Exhibits no distension and no mass. There is no tenderness.  Musculoskeletal: Normal range of motion. Exhibits no edema.  Lymphadenopathy:    No cervical adenopathy.  Neurological: Alert and oriented to person, place, and time. Exhibits normal muscle tone. Gait normal. Coordination normal.  Skin: Skin is warm and dry. No rash noted. Not  diaphoretic. No erythema. No pallor.  Psychiatric: Mood, memory and judgment normal.  Vitals reviewed.  LABORATORY DATA: Lab Results  Component Value Date   WBC 10.0 09/22/2022   HGB 10.2 (L) 09/22/2022   HCT 32.5 (L) 09/22/2022   MCV 98.8 09/22/2022   PLT 155 09/22/2022      Chemistry      Component Value Date/Time   NA 139 09/22/2022 0423   NA 141 07/12/2022 1114   K 3.2 (L) 09/22/2022 0423   CL 98 09/22/2022 0423   CO2 32 09/22/2022 0423   BUN 20 09/22/2022 0423   BUN 11 07/12/2022 1114   CREATININE 0.71 09/22/2022 0423   CREATININE 0.81 09/16/2022 1415      Component Value Date/Time   CALCIUM 8.6 (L) 09/22/2022 0423   ALKPHOS 53 09/22/2022 0423   AST 16 09/22/2022 0423   AST 18 09/16/2022 1415   ALT 31 09/22/2022 0423   ALT 68 (  H) 09/16/2022 1415   BILITOT 0.8 09/22/2022 0423   BILITOT 0.6 09/16/2022 1415       RADIOGRAPHIC STUDIES:  [place] CHEST SOFT TISSUE  Result Date: 09/22/2022 CLINICAL DATA:  Mass left abdomen EXAM: LIMITED ULTRASOUND OF ABDOMINAL SOFT TISSUES TECHNIQUE: Ultrasound examination of the abdominal wall soft tissues was performed in the area of clinical concern. COMPARISON:  Abdominal CT 09/21/2022, MRI 09/05/2022 FINDINGS: No superficial abnormality noted. The fluid collection noted along the greater curvature of the stomach is again seen as noted on prior CT measuring 12.0 x 12.6 x 4.6 cm. This appears simple. IMPRESSION: 12.6 cm fluid collection along the greater curvature of the stomach as seen on prior CT and MRI. This is most compatible with acute pancreatic fluid collection/developing pseudo cyst. Electronically Signed   By: Charlett Nose M.D.   On: 09/22/2022 18:02   CT ABDOMEN PELVIS W CONTRAST  Result Date: 09/21/2022 CLINICAL DATA:  Acute abdominal pain EXAM: CT ABDOMEN AND PELVIS WITH CONTRAST TECHNIQUE: Multidetector CT imaging of the abdomen and pelvis was performed using the standard protocol following bolus administration of intravenous  contrast. RADIATION DOSE REDUCTION: This exam was performed according to the departmental dose-optimization program which includes automated exposure control, adjustment of the mA and/or kV according to patient size and/or use of iterative reconstruction technique. CONTRAST:  OMNIPAQUE IOHEXOL 300 MG/ML  SOLN COMPARISON:  CT abdomen and pelvis 09/04/2022. MRI abdomen 09/05/2022. FINDINGS: Lower chest: There is a new small right pleural effusion. Left lower lobe mass measures 3.7 x 4.8 cm and appears unchanged. Hepatobiliary: Gallstones are again seen including a large gallstone measuring 4.3 cm. There is no biliary ductal dilatation. No focal liver lesions are seen. Pancreas: Hypodensity in the head of the pancreas measuring 7 mm image 2/34 appears unchanged. Focal hypodensity in the pancreatic tail measuring 15 x 8 mm image 2/22 is unchanged. No acute inflammation or ductal dilatation. Spleen: Normal in size without focal abnormality. Adrenals/Urinary Tract: Adrenal glands are unremarkable. Kidneys are normal, without renal calculi, focal lesion, or hydronephrosis. Bladder is unremarkable. Stomach/Bowel: Enhancing fluid collection abutting the greater curvature of the stomach is again seen and has increased in size now measuring 9.3 by 12.3 by 6.8 cm (previously 6.1 by 8.7 x 5.3 cm). Second smaller fluid collection adjacent to the gastric fundus image 2/17 measures 1.7 x 1.3 by 1.0 cm and appears similar to the prior study. The stomach is otherwise within normal limits. There is no bowel obstruction, pneumatosis or free air. The appendix is not seen. There is sigmoid colon diverticulosis. Vascular/Lymphatic: Aortic atherosclerosis. No enlarged abdominal or pelvic lymph nodes. Reproductive: Status post hysterectomy. No adnexal masses. Other: There is a small amount of free fluid in the pelvis and right upper quadrant, unchanged. There is mild body wall edema, increased from prior. Musculoskeletal: Degenerative  changes affect the spine and hips. Sclerotic density in the left femoral head is unchanged. IMPRESSION: 1. Enhancing fluid collection abutting the greater curvature of the stomach has mildly increased in size. 2. Stable small amount of free fluid in the abdomen and pelvis. 3. New small right pleural effusion. 4. Stable left lower lobe mass. 5. Stable stable hypodense lesions in the pancreas, unchanged from prior MRI. 6. Increased body wall edema. 7. Cholelithiasis. Aortic Atherosclerosis (ICD10-I70.0). Electronically Signed   By: Darliss Cheney M.D.   On: 09/21/2022 20:20   MR ABDOMEN MRCP W WO CONTAST  Result Date: 09/05/2022 CLINICAL DATA:  Pancreatitis suspected, cholelithiasis EXAM: MRI ABDOMEN WITHOUT  AND WITH CONTRAST (INCLUDING MRCP) TECHNIQUE: Multiplanar multisequence MR imaging of the abdomen was performed both before and after the administration of intravenous contrast. Heavily T2-weighted images of the biliary and pancreatic ducts were obtained, and three-dimensional MRCP images were rendered by post processing. CONTRAST:  10mL GADAVIST GADOBUTROL 1 MMOL/ML IV SOLN COMPARISON:  CT abdomen pelvis, right upper quadrant ultrasound, 09/04/2022 CT abdomen pelvis, 07/27/2022 FINDINGS: Lower chest: Medial left lower lobe mass, partially imaged, with associated trace pleural effusion (series 3, image 2). Hepatobiliary: Mild, diffuse hepatic steatosis with focal fatty deposition in the central right lobe of the liver (series 8, image 39). Large gallstone near the gallbladder neck with additional gravel and sludge in the fundus. No gallbladder wall thickening. No biliary ductal dilatation. Suspect at least one tiny gallstone in the distal common bile duct near the ampulla, measuring no greater than 0.3 cm (series 3, image 26). Pancreas: Mild inflammatory fat stranding about the pancreas. Small areas of hypoenhancing fluid signal in the superior pancreatic head, neck, and proximal body, largest individual focus  measuring 0.8 cm in the superior pancreatic head (series 5, image 23, 18, series 15, image 55, 43). Spleen: Normal in size without significant abnormality. Adrenals/Urinary Tract: Adrenal glands are unremarkable. Kidneys are normal, without renal calculi, solid lesion, or hydronephrosis. Stomach/Bowel: Large, loculated, rim enhancing fluid collection overlying the greater curvature of the stomach measuring 13.5 x 11.1 x 6.5 cm, new compared to prior examination dated 07/27/2022. (Series 4, image 7, series 3, image 29). Stomach otherwise within normal limits. No evidence of bowel wall thickening, distention, or inflammatory changes. Vascular/Lymphatic: No significant vascular findings are present. No enlarged abdominal lymph nodes. Other: No abdominal wall hernia or abnormality. Trace perihepatic and perisplenic ascites. Musculoskeletal: No acute or significant osseous findings. IMPRESSION: 1. Mild inflammatory fat stranding about the pancreas. Subcentimeter foci of hypoenhancing fluid signal in the superior pancreatic head, pancreatic neck, and proximal pancreatic body, most consistent with small foci of parenchymal necrosis and developing acute pancreatic fluid collections. 2. Large, loculated, rim enhancing fluid collection overlying the greater curvature of the stomach measuring 13.5 x 11.1 x 6.5 cm, new compared to prior examination dated 07/27/2022 and consistent with an acute pancreatic fluid collection / developing pseudocyst. The presence or absence of infection is not established by imaging. 3. Suspect at least one tiny gallstone in the distal common bile duct near the ampulla, measuring no greater than 0.3 cm. No biliary ductal dilatation. 4. Cholelithiasis. 5. Medial left lower lobe mass, partially imaged, with associated trace pleural effusion, in keeping with known primary lung malignancy. 6. Hepatic steatosis. These results will be called to the ordering clinician or representative by the Radiologist  Assistant, and communication documented in the PACS or [employer]. Electronically Signed   By: Jearld Lesch M.D.   On: 09/05/2022 07:57   MR 3D Recon At Scanner  Result Date: 09/05/2022 CLINICAL DATA:  Pancreatitis suspected, cholelithiasis EXAM: MRI ABDOMEN WITHOUT AND WITH CONTRAST (INCLUDING MRCP) TECHNIQUE: Multiplanar multisequence MR imaging of the abdomen was performed both before and after the administration of intravenous contrast. Heavily T2-weighted images of the biliary and pancreatic ducts were obtained, and three-dimensional MRCP images were rendered by post processing. CONTRAST:  10mL GADAVIST GADOBUTROL 1 MMOL/ML IV SOLN COMPARISON:  CT abdomen pelvis, right upper quadrant ultrasound, 09/04/2022 CT abdomen pelvis, 07/27/2022 FINDINGS: Lower chest: Medial left lower lobe mass, partially imaged, with associated trace pleural effusion (series 3, image 2). Hepatobiliary: Mild, diffuse hepatic steatosis with focal fatty deposition  in the central right lobe of the liver (series 8, image 39). Large gallstone near the gallbladder neck with additional gravel and sludge in the fundus. No gallbladder wall thickening. No biliary ductal dilatation. Suspect at least one tiny gallstone in the distal common bile duct near the ampulla, measuring no greater than 0.3 cm (series 3, image 26). Pancreas: Mild inflammatory fat stranding about the pancreas. Small areas of hypoenhancing fluid signal in the superior pancreatic head, neck, and proximal body, largest individual focus measuring 0.8 cm in the superior pancreatic head (series 5, image 23, 18, series 15, image 55, 43). Spleen: Normal in size without significant abnormality. Adrenals/Urinary Tract: Adrenal glands are unremarkable. Kidneys are normal, without renal calculi, solid lesion, or hydronephrosis. Stomach/Bowel: Large, loculated, rim enhancing fluid collection overlying the greater curvature of the stomach measuring 13.5 x 11.1 x 6.5 cm, new  compared to prior examination dated 07/27/2022. (Series 4, image 7, series 3, image 29). Stomach otherwise within normal limits. No evidence of bowel wall thickening, distention, or inflammatory changes. Vascular/Lymphatic: No significant vascular findings are present. No enlarged abdominal lymph nodes. Other: No abdominal wall hernia or abnormality. Trace perihepatic and perisplenic ascites. Musculoskeletal: No acute or significant osseous findings. IMPRESSION: 1. Mild inflammatory fat stranding about the pancreas. Subcentimeter foci of hypoenhancing fluid signal in the superior pancreatic head, pancreatic neck, and proximal pancreatic body, most consistent with small foci of parenchymal necrosis and developing acute pancreatic fluid collections. 2. Large, loculated, rim enhancing fluid collection overlying the greater curvature of the stomach measuring 13.5 x 11.1 x 6.5 cm, new compared to prior examination dated 07/27/2022 and consistent with an acute pancreatic fluid collection / developing pseudocyst. The presence or absence of infection is not established by imaging. 3. Suspect at least one tiny gallstone in the distal common bile duct near the ampulla, measuring no greater than 0.3 cm. No biliary ductal dilatation. 4. Cholelithiasis. 5. Medial left lower lobe mass, partially imaged, with associated trace pleural effusion, in keeping with known primary lung malignancy. 6. Hepatic steatosis. These results will be called to the ordering clinician or representative by the Radiologist Assistant, and communication documented in the PACS or [employer]. Electronically Signed   By: Jearld Lesch M.D.   On: 09/05/2022 07:57   CT Angio Chest PE W and/or Wo Contrast  Addendum Date: 09/04/2022   ADDENDUM REPORT: 09/04/2022 21:30 ADDENDUM: There is mild stranding of the upper mesentery and adjacent to the pancreas which may represent acute pancreatitis. Correlation with pancreatic enzymes recommended.  Additionally there is a loculated appearing fluid collection measuring approximately 6 x 14 cm along the greater curvature of the stomach most consistent with a pseudocyst. Electronically Signed   By: Elgie Collard M.D.   On: 09/04/2022 21:30   Result Date: 09/04/2022 CLINICAL DATA:  Upper abdominal pain and back pain. History of lung cancer. EXAM: CT ANGIOGRAPHY CHEST CT ABDOMEN AND PELVIS WITH CONTRAST TECHNIQUE: Multidetector CT imaging of the chest was performed using the standard protocol during bolus administration of intravenous contrast. Multiplanar CT image reconstructions and MIPs were obtained to evaluate the vascular anatomy. Multidetector CT imaging of the abdomen and pelvis was performed using the standard protocol during bolus administration of intravenous contrast. RADIATION DOSE REDUCTION: This exam was performed according to the departmental dose-optimization program which includes automated exposure control, adjustment of the mA and/or kV according to patient size and/or use of iterative reconstruction technique. CONTRAST:  OMNIPAQUE IOHEXOL 350 MG/ML SOLN COMPARISON:  CT abdomen pelvis  dated 07/27/2022. chest CT dated 07/15/2022. FINDINGS: CTA CHEST FINDINGS Cardiovascular: There is no cardiomegaly or pericardial effusion. Three-vessel coronary vascular calcification. Mild atherosclerotic calcification of the thoracic aorta. No aneurysmal dilatation or dissection. The origins of the great vessels of the aortic arch appear patent. Right-sided Port-A-Cath with tip in the right atrium close to the cavoatrial junction. No pulmonary artery embolus identified. Mediastinum/Nodes: No hilar or mediastinal adenopathy. The esophagus and the thyroid gland are grossly unremarkable. No mediastinal fluid collection. Lungs/Pleura: Grossly stable left lower lobe mass in keeping with known malignancy. Several scattered pulmonary nodules measure up to 9 mm in the right upper lobe similar to prior CT and  most consistent with metastasis. No new consolidation. No pleural effusion or pneumothorax. The central airways are patent. Musculoskeletal: Osteopenia with degenerative changes spine. No acute osseous pathology. Review of the MIP images confirms the above findings. CT ABDOMEN and PELVIS FINDINGS No intra-abdominal free air.  Small ascites. Hepatobiliary: The liver is unremarkable. Focal area of hypoenhancement in the left lobe of the liver centrally, likely fatty infiltration. There is mild biliary dilatation. There is a large rim calcified stone in the gallbladder measuring approximately 5.2 cm in diameter. No pericholecystic fluid or evidence of acute cholecystitis by CT. Pancreas: Subcentimeter hypodense lesion in the uncinate process of the pancreas appears similar to prior CT and may represent focal interspersed fat or a side branch IPMN. No active inflammatory changes. No dilatation of the main pancreatic duct or gland atrophy. Spleen: Normal in size without focal abnormality. Adrenals/Urinary Tract: The adrenal glands are unremarkable. There is no hydronephrosis on either side. There is symmetric enhancement and excretion of contrast by both kidneys. The visualized ureters appear unremarkable. The urinary bladder is collapsed. Stomach/Bowel: There is sigmoid diverticulosis and scattered colonic diverticula without active inflammatory changes. There is no bowel obstruction or active inflammation. The appendix is normal. Vascular/Lymphatic: Advanced aortoiliac atherosclerotic disease. The IVC is unremarkable. No portal venous gas. There is no adenopathy. Reproductive: Hysterectomy.  No adnexal masses. Other: None Musculoskeletal: Osteopenia with degenerative changes of the spine. No acute osseous pathology. Review of the MIP images confirms the above findings. IMPRESSION: 1. No acute intrathoracic, abdominal, or pelvic pathology. No CT evidence of pulmonary artery embolus. 2. Grossly stable left lower lobe  mass in keeping with known malignancy. 3. Stable pulmonary nodules most consistent with metastasis. 4. Cholelithiasis. 5. Colonic diverticulosis. No bowel obstruction. Normal appendix. 6.  Aortic Atherosclerosis (ICD10-I70.0). Electronically Signed: By: Elgie Collard M.D. On: 09/04/2022 19:59   CT ABDOMEN PELVIS W CONTRAST  Addendum Date: 09/04/2022   ADDENDUM REPORT: 09/04/2022 21:30 ADDENDUM: There is mild stranding of the upper mesentery and adjacent to the pancreas which may represent acute pancreatitis. Correlation with pancreatic enzymes recommended. Additionally there is a loculated appearing fluid collection measuring approximately 6 x 14 cm along the greater curvature of the stomach most consistent with a pseudocyst. Electronically Signed   By: Elgie Collard M.D.   On: 09/04/2022 21:30   Result Date: 09/04/2022 CLINICAL DATA:  Upper abdominal pain and back pain. History of lung cancer. EXAM: CT ANGIOGRAPHY CHEST CT ABDOMEN AND PELVIS WITH CONTRAST TECHNIQUE: Multidetector CT imaging of the chest was performed using the standard protocol during bolus administration of intravenous contrast. Multiplanar CT image reconstructions and MIPs were obtained to evaluate the vascular anatomy. Multidetector CT imaging of the abdomen and pelvis was performed using the standard protocol during bolus administration of intravenous contrast. RADIATION DOSE REDUCTION: This exam was performed according to  the departmental dose-optimization program which includes automated exposure control, adjustment of the mA and/or kV according to patient size and/or use of iterative reconstruction technique. CONTRAST:  OMNIPAQUE IOHEXOL 350 MG/ML SOLN COMPARISON:  CT abdomen pelvis dated 07/27/2022. chest CT dated 07/15/2022. FINDINGS: CTA CHEST FINDINGS Cardiovascular: There is no cardiomegaly or pericardial effusion. Three-vessel coronary vascular calcification. Mild atherosclerotic calcification of the thoracic aorta.  No aneurysmal dilatation or dissection. The origins of the great vessels of the aortic arch appear patent. Right-sided Port-A-Cath with tip in the right atrium close to the cavoatrial junction. No pulmonary artery embolus identified. Mediastinum/Nodes: No hilar or mediastinal adenopathy. The esophagus and the thyroid gland are grossly unremarkable. No mediastinal fluid collection. Lungs/Pleura: Grossly stable left lower lobe mass in keeping with known malignancy. Several scattered pulmonary nodules measure up to 9 mm in the right upper lobe similar to prior CT and most consistent with metastasis. No new consolidation. No pleural effusion or pneumothorax. The central airways are patent. Musculoskeletal: Osteopenia with degenerative changes spine. No acute osseous pathology. Review of the MIP images confirms the above findings. CT ABDOMEN and PELVIS FINDINGS No intra-abdominal free air.  Small ascites. Hepatobiliary: The liver is unremarkable. Focal area of hypoenhancement in the left lobe of the liver centrally, likely fatty infiltration. There is mild biliary dilatation. There is a large rim calcified stone in the gallbladder measuring approximately 5.2 cm in diameter. No pericholecystic fluid or evidence of acute cholecystitis by CT. Pancreas: Subcentimeter hypodense lesion in the uncinate process of the pancreas appears similar to prior CT and may represent focal interspersed fat or a side branch IPMN. No active inflammatory changes. No dilatation of the main pancreatic duct or gland atrophy. Spleen: Normal in size without focal abnormality. Adrenals/Urinary Tract: The adrenal glands are unremarkable. There is no hydronephrosis on either side. There is symmetric enhancement and excretion of contrast by both kidneys. The visualized ureters appear unremarkable. The urinary bladder is collapsed. Stomach/Bowel: There is sigmoid diverticulosis and scattered colonic diverticula without active inflammatory changes. There  is no bowel obstruction or active inflammation. The appendix is normal. Vascular/Lymphatic: Advanced aortoiliac atherosclerotic disease. The IVC is unremarkable. No portal venous gas. There is no adenopathy. Reproductive: Hysterectomy.  No adnexal masses. Other: None Musculoskeletal: Osteopenia with degenerative changes of the spine. No acute osseous pathology. Review of the MIP images confirms the above findings. IMPRESSION: 1. No acute intrathoracic, abdominal, or pelvic pathology. No CT evidence of pulmonary artery embolus. 2. Grossly stable left lower lobe mass in keeping with known malignancy. 3. Stable pulmonary nodules most consistent with metastasis. 4. Cholelithiasis. 5. Colonic diverticulosis. No bowel obstruction. Normal appendix. 6.  Aortic Atherosclerosis (ICD10-I70.0). Electronically Signed: By: Elgie Collard M.D. On: 09/04/2022 19:59   US Abdomen Limited RUQ (LIVER/GB)  Result Date: 09/04/2022 CLINICAL DATA:  Pancreatitis. EXAM: ULTRASOUND ABDOMEN LIMITED RIGHT UPPER QUADRANT COMPARISON:  CT abdomen pelvis dated 09/04/2022. FINDINGS: Gallbladder: There is a large stone in the gallbladder, better seen on the CT. No gallbladder wall thickening or pericholecystic fluid. Negative sonographic Murphy's sign. Common bile duct: Diameter: 5 mm Liver: There is diffuse increased liver echogenicity most commonly seen in the setting of fatty infiltration. Superimposed inflammation or fibrosis is not excluded. Clinical correlation is recommended. Portal vein is patent on color Doppler imaging with normal direction of blood flow towards the liver. Other: Small ascites. IMPRESSION: 1. Cholelithiasis without sonographic evidence of acute cholecystitis. 2. Fatty liver. 3. Small ascites. Electronically Signed   By: Ceasar Mons.D.  On: 09/04/2022 21:25     ASSESSMENT/PLAN:  No problem-specific Assessment & Plan notes found for this encounter.   No orders of the defined types were placed in this  encounter.    I spent {CHL ONC TIME VISIT - ZOXWR:[id number]} counseling the patient face to face. The total time spent in the appointment was {CHL ONC TIME VISIT - BJYNW:[id number]}.   L , PA-C 09/23/22

## 2022-09-24 ENCOUNTER — Inpatient Hospital Stay: Payer: BC Managed Care – PPO | Admitting: Physician Assistant

## 2022-09-24 ENCOUNTER — Other Ambulatory Visit: Payer: BC Managed Care – PPO

## 2022-09-24 ENCOUNTER — Inpatient Hospital Stay: Payer: BC Managed Care – PPO

## 2022-09-24 DIAGNOSIS — R1012 Left upper quadrant pain: Secondary | ICD-10-CM | POA: Diagnosis not present

## 2022-09-24 LAB — URINE CULTURE: Culture: 40000 — AB

## 2022-09-24 LAB — GLUCOSE, CAPILLARY
Glucose-Capillary: 158 mg/dL — ABNORMAL HIGH (ref 70–99)
Glucose-Capillary: 298 mg/dL — ABNORMAL HIGH (ref 70–99)
Glucose-Capillary: 304 mg/dL — ABNORMAL HIGH (ref 70–99)
Glucose-Capillary: 199 mg/dL — ABNORMAL HIGH (ref 70–99)

## 2022-09-24 NOTE — TOC Progression Note (Signed)
Transition of Care [facility]) - Progression Note    Patient Details  Name: Carolyn Hooper MRN: [id number] Date of Birth: 12-17-1960  Transition of Care [facility]) CM/SW Contact  Beckie Busing, RN Phone Number:[phone]  09/24/2022, 3:49 PM  Clinical Narrative:    CM at bedside to complete high risk assessment. Patient is sleeping soundly. CM has made several attempts to engage patient in conversation. Patient remains drowsy and does not participate in conversation. CM will attempt assessment at another time.         Expected Discharge Plan and Services                                               Social Determinants of Health (SDOH) Interventions SDOH Screenings   Food Insecurity: No Food Insecurity (09/21/2022)  Housing: Low Risk  (09/21/2022)  Transportation Needs: No Transportation Needs (09/21/2022)  Utilities: Not At Risk (09/21/2022)  Depression (PHQ2-9): Medium Risk (07/12/2022)  Tobacco Use: Medium Risk (09/21/2022)    Readmission Risk Interventions    09/07/2022    2:44 PM 07/28/2022   11:16 AM  Readmission Risk Prevention Plan  Transportation Screening Complete Complete  HRI or Home Care Consult  Complete  Social Work Consult for Recovery Care Planning/Counseling  Complete  Palliative Care Screening  Not Applicable  Medication Review Oceanographer) Complete Complete  PCP or Specialist appointment within 3-5 days of discharge Complete   HRI or Home Care Consult Complete   SW Recovery Care/Counseling Consult Complete   Palliative Care Screening Not Applicable   Skilled Nursing Facility Not Applicable

## 2022-09-24 NOTE — Progress Notes (Signed)
RORY MONTEL 12:33 PM  Subjective: Patient is doing better today we had a long conversation about drainage of her pseudocyst and specifically we discussed the risk benefits methods of EUS and drainage including rare anesthesia issues bleeding pancreatitis infection and perforation and we answered all of her questions and she will think about it while we wait to see if Dr. Meridee Score is available on Thursday and if she would like to proceed I am happy to discuss with her family tomorrow as well  Objective: Vital signs stable afebrile no acute distress abdomen is softer less pain no guarding or rebound no new labs  Assessment: Pancreatic pseudocyst with recent pancreatitis caused by either  medicine induced versus CBD stone  Plan: Will check on tomorrow and decide if we are going to proceed and we also discussed a possible ERCP and CBD stone extraction and sphincterotomy at some point in the future  [facility]  office [phone] After 5PM or if no answer call [phone]

## 2022-09-24 NOTE — Progress Notes (Signed)
[facility] Progress Note  Patient: Carolyn Hooper     ZOX:[id number]  DOA: 09/21/2022   PCP: [name], FNP       Brief hospital course: This is a 62 year old female with non-small cell lung cancer, brain metastasis, focal seizures, diabetes mellitus who presents to the hospital for abdominal pain. She was last hospitalized from 6/19-6/27 with epigastric pain. Sas found to have acute pancreatitis with a pseudocyst felt to be either gallstone pancreatitis versus secondary to immunotherapy.  The patient was started on Decadron due to the possibility of immunotherapy mediated pancreatitis.  She returns to the hospital for abdominal pain which is different from her prior episode of acute pancreatitis.  She describes the pain as dull and achy but stabbing at times.  She complains of on and off dysuria.  She has not had any episodes of nausea vomiting or diarrhea.  Of note on 7/5 she was evaluated by family medicine for vaginal pain-herpes simplex virus culture was ordered.  A wet prep trichomonas, yeast, clue cells was negative.  In the ED: Lipase was 170  CT abdomen pelvis revealed an enhancing fluid collection abutting the greater curvature of the stomach that was mildly increased in size, stable hypodense lesions in the pancreas unchanged from prior MRI. - Stable left lower lobe mass, new small right pleural effusion -Increased body wall edema -Cholelithiasis  Subjective:  There is no change in her abdominal pain.  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 - Diagnosed in 11/23 - Status post SRS to  brain metastasis which was completed in 1/24 - Given systemic chemotherapy-7 cycles in December 2023 - She is on maintenance treatment with Alimta and Keytruda every 3 weeks  History of seizures - Continue Keppra at 1000 mg twice daily  Recent acute pancreatitis with pseudocyst - She is on a high-dose prednisone taper which was started during her last admission for possible immunotherapy mediated pancreatitis -This has been weaned to 80 mg daily for 3 days -Creon has been resume as diet has been advance to solids  Uncontrolled diabetes mellitus type 2 with hyperglycemia - Her last A1c was performed on 07/02/2022 and was 8.2 - She is receiving Semglee and NovoLog sliding scale -Metformin is on hold  Pyuria - Noted to have 20-50 WBCs in her urine and no bacteria - No need to treat  Anxiety - She takes clonazepam 0.5 mg twice daily     Code Status: Full Code Consultants: GI Level of Care: Level of care: Med-Surg Total time on patient care: 25 min DVT prophylaxis:  SCDs Start: 09/21/22 2309     Objective:   Vitals:   09/23/22 1947 09/24/22 0513 09/24/22 0725 09/24/22 1406  BP: (!) 149/73 134/60  (!) 175/85  Pulse: 61 (!) 59  64  Resp: 18 18  18   Temp: 98.1 F (36.7 C) 98.1 F (36.7 C)  97.6 F (36.4 C)  TempSrc: Oral Oral  Oral  SpO2: 98% 95%  96%  Weight:   104.1 kg   Height:       [employer]  09/21/22 2324 09/23/22 0457 09/24/22 0725  Weight: 102.5 kg 103.4 kg 104.1 kg   Exam: General exam: Appears comfortable  HEENT: oral mucosa moist Respiratory system: Clear to auscultation.  Cardiovascular system: S1 & S2 heard  Gastrointestinal system: Abdomen soft, mild diffuse tenderness, nondistended. Normal bowel sounds   Extremities: No cyanosis, clubbing or edema Psychiatry:  Mood & affect appropriate.       CBC: Recent Labs  Lab 09/21/22 1700 09/22/22 0423  WBC 8.0 10.0  NEUTROABS 7.1  --   HGB 11.4* 10.2*  HCT 35.6* 32.5*  MCV 98.6 98.8  PLT  185 155    Basic Metabolic Panel: Recent Labs  Lab 09/21/22 1700 09/22/22 0423  NA 136 139  K 3.8 3.2*  CL 98 98  CO2 27 32  GLUCOSE 269* 151*  BUN 26* 20  CREATININE 0.79 0.71  CALCIUM 8.7* 8.6*    GFR: Estimated Creatinine Clearance: 92.1 mL/min (by C-G formula based on SCr of 0.71 mg/dL).  Scheduled Meds:  Chlorhexidine Gluconate Cloth  6 each Topical Daily   famotidine  20 mg Oral BID   folic acid  1 mg Oral Daily   insulin aspart  0-15 Units Subcutaneous TID WC   insulin glargine-yfgn  15 Units Subcutaneous Daily   levETIRAcetam  1,000 mg Oral BID   lipase/protease/amylase  24,000 Units Oral TID WC   pantoprazole  40 mg Oral QAC lunch   Ensure Max Protein  11 oz Oral BID   Continuous Infusions: Imaging and lab data was personally reviewed No results found.  LOS: 2 days   Author: Calvert Cantor  09/24/2022 5:46 PM  To contact [facility]>   Check the care team in [facility] and look for the attending/consulting TR[facility] provider listed  Log into [email or web address] and use [place]'s universal password   Go to> "[facility]"  and find provider  If you still have difficulty reaching the provider, please page the [facility] (Director on Call) for the Hospitalists listed on amion

## 2022-09-24 NOTE — Inpatient Diabetes Management (Signed)
Inpatient Diabetes Program Recommendations  AACE/ADA: New Consensus Statement on Inpatient Glycemic Control (2015)  Target Ranges:  Prepandial:   less than 140 mg/dL      Peak postprandial:   less than 180 mg/dL (1-2 hours)      Critically ill patients:  140 - 180 mg/dL   Lab Results  Component Value Date   GLUCAP 158 (H) 09/24/2022   HGBA1C 8.4 (H) 09/04/2022    Review of Glycemic Control  Latest Reference Range & Units 09/23/22 07:37 09/23/22 12:41 09/23/22 16:51 09/23/22 21:04 09/24/22 07:44  Glucose-Capillary 70 - 99 mg/dL 161 (H) 096 (H) 045 (H) 196 (H) 158 (H)   Diabetes history: DM  Outpatient Diabetes medications:  Lantus 15 units daily Metformin 1000 mg bid Pred. taper Current orders for Inpatient glycemic control:  Novolog 0-15 units tid with meals Semglee 15 units daily  Inpatient Diabetes Program Recommendations:    Note that post-prandial blood sugars increased.  If blood sugar at lunch and dinner remain> goal, consider adding Novolog 3 units tid with meals (hold if patient eats less than 50% or NPO).    Thanks,  Beryl Meager, RN, BC-ADM Inpatient Diabetes Coordinator Pager [phone]  (8a-5p)

## 2022-09-25 ENCOUNTER — Other Ambulatory Visit (HOSPITAL_COMMUNITY): Payer: Self-pay

## 2022-09-25 DIAGNOSIS — E1169 Type 2 diabetes mellitus with other specified complication: Secondary | ICD-10-CM

## 2022-09-25 DIAGNOSIS — C3490 Malignant neoplasm of unspecified part of unspecified bronchus or lung: Secondary | ICD-10-CM | POA: Diagnosis not present

## 2022-09-25 DIAGNOSIS — R1012 Left upper quadrant pain: Secondary | ICD-10-CM | POA: Diagnosis not present

## 2022-09-25 LAB — GLUCOSE, CAPILLARY
Glucose-Capillary: 114 mg/dL — ABNORMAL HIGH (ref 70–99)
Glucose-Capillary: 159 mg/dL — ABNORMAL HIGH (ref 70–99)
Glucose-Capillary: 89 mg/dL (ref 70–99)
Glucose-Capillary: 126 mg/dL — ABNORMAL HIGH (ref 70–99)

## 2022-09-25 LAB — BASIC METABOLIC PANEL
Anion gap: 8 (ref 5–15)
BUN: 23 mg/dL (ref 8–23)
CO2: 30 mmol/L (ref 22–32)
Calcium: 8.9 mg/dL (ref 8.9–10.3)
Chloride: 99 mmol/L (ref 98–111)
Creatinine, Ser: 0.82 mg/dL (ref 0.44–1.00)
GFR, Estimated: >60 mL/min (ref >60)
Glucose, Bld: 142 mg/dL — ABNORMAL HIGH (ref 70–99)
Potassium: 4 mmol/L (ref 3.5–5.1)
Sodium: 137 mmol/L (ref 135–145)

## 2022-09-25 MED ORDER — INSULIN ASPART 100 UNIT/ML IJ SOLN
2.0000 [IU] | Freq: Three times a day (TID) | INTRAMUSCULAR | Status: DC
  Administered 2022-09-25 – 2022-09-29 (×7): 2 [IU] via SUBCUTANEOUS

## 2022-09-25 NOTE — Anesthesia Preprocedure Evaluation (Addendum)
Anesthesia Evaluation  Patient identified by MRN, date of birth, ID band Patient awake    Reviewed: Allergy & Precautions, NPO status , Patient's Chart, lab work & pertinent test results  Airway Mallampati: III  TM Distance: >3 FB Neck ROM: Full    Dental no notable dental hx. (+) Teeth Intact, Dental Advisory Given   Pulmonary former smoker  NSCLC with brain mets   Pulmonary exam normal breath sounds clear to auscultation       Cardiovascular hypertension, Normal cardiovascular exam Rhythm:Regular Rate:Normal     Neuro/Psych Seizures -,  PSYCHIATRIC DISORDERS Anxiety        GI/Hepatic ,GERD  Medicated and Controlled,,  Endo/Other  diabetes, Type 1, Insulin Dependent    Renal/GU      Musculoskeletal   Abdominal   Peds  Hematology   Anesthesia Other Findings All: Statins  Reproductive/Obstetrics                             Anesthesia Physical Anesthesia Plan  ASA: 3  Anesthesia Plan: General   Post-op Pain Management:    Induction: Intravenous, Cricoid pressure planned and Rapid sequence  PONV Risk Score and Plan: 3 and Treatment may vary due to age or medical condition and Propofol infusion  Airway Management Planned: Oral ETT  Additional Equipment: None  Intra-op Plan:   Post-operative Plan: Extubation in OR  Informed Consent: I have reviewed the patients History and Physical, chart, labs and discussed the procedure including the risks, benefits and alternatives for the proposed anesthesia with the patient or authorized representative who has indicated his/her understanding and acceptance.     Dental advisory given  Plan Discussed with: CRNA  Anesthesia Plan Comments: (Pancreatic Pseudocyst For EUS under GA)        Anesthesia Quick Evaluation

## 2022-09-25 NOTE — Progress Notes (Signed)
Mobility Specialist - Progress Note   09/25/22 1339  Mobility  Activity Ambulated independently in hallway  Level of Assistance Independent  Assistive Device None  Distance Ambulated (ft) 480 ft  Activity Response Tolerated well  Mobility Referral Yes  $Mobility charge 1 Mobility  Mobility Specialist Start Time (ACUTE ONLY) 0128  Mobility Specialist Stop Time (ACUTE ONLY) 0139  Mobility Specialist Time Calculation (min) (ACUTE ONLY) 11 min   Pt received EOB and agreeable to mobility. No complaints during session. Pt to bed after session with all needs met.    [facility]

## 2022-09-25 NOTE — Progress Notes (Signed)
[facility]                                                                               Carolyn Hooper, is a 62 y.o. female, DOB - 1960-09-04, UEA:[id number] Admit date - 09/21/2022    Outpatient Primary MD for the patient is [name], FNP  LOS - 3  days    Brief summary    62 year old female with non-small cell lung cancer, brain metastasis, focal seizures, diabetes mellitus who presents to the hospital for abdominal pain. She was last hospitalized from 6/19-6/27 with epigastric pain. Sas found to have acute pancreatitis with a pseudocyst felt to be either gallstone pancreatitis versus secondary to immunotherapy.  The patient was started on Decadron due to the possibility of immunotherapy mediated pancreatitis.   She returns to the hospital for abdominal pain which is different from her prior episode of acute pancreatitis.  She describes the pain as dull and achy but stabbing at times.    CT abdomen pelvis revealed an enhancing fluid collection abutting the greater curvature of the stomach that was mildly increased in size, stable hypodense lesions in the pancreas unchanged from prior MRI.    Assessment & Plan    Assessment and Plan:   Abdominal pain. ? From symptomatic pancreatic cyst  Would benefit from EUS.  Will keep her NPO after midnight.  Continue with creon, pain meds.  Symptomatic management with anti emetics.    Type 2 diabetes mellitus (HCC) Recent A1C of 8.4 in June 2024  CBG (last 3)  Recent Labs    09/25/22 0805 09/25/22 1128 09/25/22 1722  GLUCAP 114* 159* 89   Resume SSI.    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     Non-small cell lung cancer (HCC) Follows with Dr. Arbutus Ped Immunotherapy stopped at recent hospitalization due to possibility of link with pancreatitis.    Focal seizure (HCC) Continue keppra Seizure precautions   GAD (generalized anxiety disorder) Continue klonopin PRN    GERD (gastroesophageal reflux disease) Continue pepcid BID Protonix daily          Estimated body mass index is 34.66 kg/m as calculated from the following:   Height as of this encounter: 5\' 8"  (1.727 m).   Weight as of this encounter: 103.4 kg.  Code Status: full code.  DVT Prophylaxis:  SCDs Start: 09/21/22 2309   Level of Care: Level of care: Med-Surg Family Communication: none at bedside.   Disposition Plan:     Remains inpatient appropriate:  Pending GI work up.  Procedures:  EUS I nam.   Consultants:   Gastroenterology.   Antimicrobials:   Anti-infectives (From admission, onward)    None        Medications  Scheduled Meds:  Chlorhexidine Gluconate Cloth  6 each Topical Daily   famotidine  20 mg Oral BID   folic acid  1 mg Oral Daily   insulin aspart  0-15 Units Subcutaneous TID WC   insulin aspart  2 Units Subcutaneous TID WC   insulin glargine-yfgn  15 Units Subcutaneous Daily   levETIRAcetam  1,000 mg Oral BID   lipase/protease/amylase  24,000 Units  Oral TID WC   pantoprazole  40 mg Oral QAC lunch   Ensure Max Protein  11 oz Oral BID   Continuous Infusions: PRN Meds:.acetaminophen **OR** acetaminophen, clonazePAM, HYDROmorphone, lipase/protease/amylase, metoprolol tartrate, ondansetron **OR** ondansetron (ZOFRAN) IV, polyethylene glycol, senna    Subjective:   Carolyn Hooper was seen and examined today.  Reports being frustrated.   Objective:   Vitals:   09/24/22 2026 09/25/22 0504 09/25/22 0726 09/25/22 1331  BP: (!) 157/71 (!) 138/56  (!) 146/82  Pulse: 63 61  79  Resp: 18 18  20   Temp: 98.2 F (36.8 C) 98 F (36.7 C)  98.2 F (36.8 C)  TempSrc: Oral Oral  Oral  SpO2: 98% 98%  100%  Weight:   103.4 kg   Height:        Intake/Output Summary (Last 24 hours) at 09/25/2022 1731 Last data filed at 09/25/2022 1300 Gross per 24 hour  Intake 600 ml  Output --  Net 600 ml   Filed Weights   09/23/22 0457 09/24/22 0725 09/25/22  0726  Weight: 103.4 kg 104.1 kg 103.4 kg     Exam General exam: Appears calm and comfortable  Respiratory system: Clear to auscultation. Respiratory effort normal. Cardiovascular system: S1 & S2 heard, RRR. No JVD,  Gastrointestinal system: Abdomen is nondistended, soft and nontender.  Central nervous system: Alert and oriented. No focal neurological deficits. Extremities: Symmetric 5 x 5 power. Skin: No rashes,  Psychiatry:  Mood & affect appropriate.     Data Reviewed:  I have personally reviewed following labs and imaging studies   CBC Lab Results  Component Value Date   WBC 10.0 09/22/2022   RBC 3.29 (L) 09/22/2022   HGB 10.2 (L) 09/22/2022   HCT 32.5 (L) 09/22/2022   MCV 98.8 09/22/2022   MCH 31.0 09/22/2022   PLT 155 09/22/2022   MCHC 31.4 09/22/2022   RDW 18.0 (H) 09/22/2022   LYMPHSABS 0.6 (L) 09/21/2022   MONOABS 0.3 09/21/2022   EOSABS 0.0 09/21/2022   BASOSABS 0.0 09/21/2022     Last metabolic panel Lab Results  Component Value Date   NA 137 09/25/2022   K 4.0 09/25/2022   CL 99 09/25/2022   CO2 30 09/25/2022   BUN 23 09/25/2022   CREATININE 0.82 09/25/2022   GLUCOSE 142 (H) 09/25/2022   GFRNONAA >60 09/25/2022   GFRAA 93 09/21/2019   CALCIUM 8.9 09/25/2022   PHOS 3.3 02/12/2022   PROT 5.4 (L) 09/22/2022   ALBUMIN 2.7 (L) 09/22/2022   LABGLOB 2.3 07/02/2022   AGRATIO 1.6 07/02/2022   BILITOT 0.8 09/22/2022   ALKPHOS 53 09/22/2022   AST 16 09/22/2022   ALT 31 09/22/2022   ANIONGAP 8 09/25/2022    CBG (last 3)  Recent Labs    09/25/22 0805 09/25/22 1128 09/25/22 1722  GLUCAP 114* 159* 89      Coagulation Profile: No results for input(s): "INR", "PROTIME" in the last 168 hours.   Radiology Studies: No results found.     Kathlen Mody M.D. [facility] 09/25/2022, 5:31 PM  Available via Epic secure chat 7am-7pm After 7 pm, please refer to night coverage provider listed on amion.

## 2022-09-25 NOTE — H&P (View-Only) (Signed)
Carolyn Hooper 12:40 PM  Subjective: Patient doing better having less pain eating a little more no new complaints except a runny nose and again we had a long conversation about EUS and cyst drainage I talked to her mom on the phone and explained the risk benefits etc. and currently they are in agreement with proceeding although they are aware with anesthesia issues it was originally told to me would be tomorrow but it might get postponed  Objective: Vital signs stable afebrile no acute distress abdomen is softer less tender chemistry is okay  Assessment: Multiple medical problems including symptomatic pancreatic cyst  Plan: As above the risk benefits and methods of EUS with drainage was thoroughly discussed with the patient and her mother on the phone and hopefully Dr. Mansouraty can proceed tomorrow as planned and keep patient n.p.o. after midnight but if he is unable to to do it will restart her diet as soon as we know  Carolyn Hooper  office [phone] After 5PM or if no answer call [phone]  

## 2022-09-25 NOTE — Progress Notes (Signed)
Carolyn Hooper 12:40 PM  Subjective: Patient doing better having less pain eating a little more no new complaints except a runny nose and again we had a long conversation about EUS and cyst drainage I talked to her mom on the phone and explained the risk benefits etc. and currently they are in agreement with proceeding although they are aware with anesthesia issues it was originally told to me would be tomorrow but it might get postponed  Objective: Vital signs stable afebrile no acute distress abdomen is softer less tender chemistry is okay  Assessment: Multiple medical problems including symptomatic pancreatic cyst  Plan: As above the risk benefits and methods of EUS with drainage was thoroughly discussed with the patient and her mother on the phone and hopefully Dr. Meridee Score can proceed tomorrow as planned and keep patient n.p.o. after midnight but if he is unable to to do it will restart her diet as soon as we know  [facility]  office [phone] After 5PM or if no answer call [phone]

## 2022-09-26 ENCOUNTER — Encounter (HOSPITAL_COMMUNITY): Payer: Self-pay | Admitting: Internal Medicine

## 2022-09-26 ENCOUNTER — Encounter (HOSPITAL_COMMUNITY)
Admission: EM | Disposition: A | Discharge status: Home Health | Payer: Self-pay | Source: Home / Self Care | Attending: Internal Medicine

## 2022-09-26 ENCOUNTER — Ambulatory Visit: Payer: BC Managed Care – PPO | Admitting: Physical Therapy

## 2022-09-26 ENCOUNTER — Inpatient Hospital Stay (HOSPITAL_COMMUNITY): Payer: BC Managed Care – PPO | Admitting: Anesthesiology

## 2022-09-26 ENCOUNTER — Inpatient Hospital Stay (HOSPITAL_COMMUNITY): Payer: Self-pay | Admitting: Anesthesiology

## 2022-09-26 DIAGNOSIS — C3490 Malignant neoplasm of unspecified part of unspecified bronchus or lung: Secondary | ICD-10-CM | POA: Diagnosis not present

## 2022-09-26 DIAGNOSIS — K859 Acute pancreatitis without necrosis or infection, unspecified: Secondary | ICD-10-CM

## 2022-09-26 DIAGNOSIS — K863 Pseudocyst of pancreas: Principal | ICD-10-CM

## 2022-09-26 DIAGNOSIS — K2289 Other specified disease of esophagus: Secondary | ICD-10-CM

## 2022-09-26 DIAGNOSIS — K869 Disease of pancreas, unspecified: Secondary | ICD-10-CM

## 2022-09-26 DIAGNOSIS — E1169 Type 2 diabetes mellitus with other specified complication: Secondary | ICD-10-CM | POA: Diagnosis not present

## 2022-09-26 DIAGNOSIS — R1012 Left upper quadrant pain: Secondary | ICD-10-CM | POA: Diagnosis not present

## 2022-09-26 DIAGNOSIS — K449 Diaphragmatic hernia without obstruction or gangrene: Secondary | ICD-10-CM

## 2022-09-26 DIAGNOSIS — K2970 Gastritis, unspecified, without bleeding: Secondary | ICD-10-CM

## 2022-09-26 HISTORY — PX: BIOPSY: SHX5522

## 2022-09-26 HISTORY — PX: UPPER ESOPHAGEAL ENDOSCOPIC ULTRASOUND (EUS): SHX6562

## 2022-09-26 HISTORY — PX: PANCREATIC STENT PLACEMENT: SHX5539

## 2022-09-26 HISTORY — PX: ESOPHAGOGASTRODUODENOSCOPY (EGD) WITH PROPOFOL: SHX5813

## 2022-09-26 HISTORY — PX: BALLOON DILATION: SHX5330

## 2022-09-26 LAB — GLUCOSE, CAPILLARY
Glucose-Capillary: 111 mg/dL — ABNORMAL HIGH (ref 70–99)
Glucose-Capillary: 146 mg/dL — ABNORMAL HIGH (ref 70–99)
Glucose-Capillary: 150 mg/dL — ABNORMAL HIGH (ref 70–99)
Glucose-Capillary: 149 mg/dL — ABNORMAL HIGH (ref 70–99)
Glucose-Capillary: 186 mg/dL — ABNORMAL HIGH (ref 70–99)

## 2022-09-26 LAB — PROTIME-INR
INR: 1.1 (ref 0.8–1.2)
Prothrombin Time: 14.4 seconds (ref 11.4–15.2)

## 2022-09-26 SURGERY — UPPER ESOPHAGEAL ENDOSCOPIC ULTRASOUND (EUS)
Anesthesia: General

## 2022-09-26 MED ORDER — DEXAMETHASONE SODIUM PHOSPHATE 10 MG/ML IJ SOLN
INTRAMUSCULAR | Status: DC | PRN
  Administered 2022-09-26: 4 mg via INTRAVENOUS

## 2022-09-26 MED ORDER — LIDOCAINE HCL (CARDIAC) PF 100 MG/5ML IV SOSY
PREFILLED_SYRINGE | INTRAVENOUS | Status: DC | PRN
  Administered 2022-09-26: 100 mg via INTRAVENOUS

## 2022-09-26 MED ORDER — CIPROFLOXACIN HCL 500 MG PO TABS
500.0000 mg | ORAL_TABLET | Freq: Two times a day (BID) | ORAL | Status: DC
  Administered 2022-09-27 – 2022-09-29 (×5): 500 mg via ORAL
  Filled 2022-09-26 (×6): qty 1

## 2022-09-26 MED ORDER — ROCURONIUM BROMIDE 100 MG/10ML IV SOLN
INTRAVENOUS | Status: DC | PRN
  Administered 2022-09-26: 20 mg via INTRAVENOUS

## 2022-09-26 MED ORDER — PROPOFOL 10 MG/ML IV BOLUS
INTRAVENOUS | Status: DC | PRN
  Administered 2022-09-26: 100 ug/kg/min via INTRAVENOUS

## 2022-09-26 MED ORDER — PROPOFOL 500 MG/50ML IV EMUL
INTRAVENOUS | Status: DC | PRN
  Administered 2022-09-26: 150 mg via INTRAVENOUS

## 2022-09-26 MED ORDER — FENTANYL CITRATE (PF) 100 MCG/2ML IJ SOLN
INTRAMUSCULAR | Status: AC
  Filled 2022-09-26: qty 2

## 2022-09-26 MED ORDER — PHENYLEPHRINE 80 MCG/ML (10ML) SYRINGE FOR IV PUSH (FOR BLOOD PRESSURE SUPPORT)
PREFILLED_SYRINGE | INTRAVENOUS | Status: DC | PRN
  Administered 2022-09-26 (×2): 120 ug via INTRAVENOUS
  Administered 2022-09-26: 80 ug via INTRAVENOUS

## 2022-09-26 MED ORDER — SUCCINYLCHOLINE CHLORIDE 200 MG/10ML IV SOSY
PREFILLED_SYRINGE | INTRAVENOUS | Status: DC | PRN
  Administered 2022-09-26: 100 mg via INTRAVENOUS

## 2022-09-26 MED ORDER — CIPROFLOXACIN IN D5W 400 MG/200ML IV SOLN
INTRAVENOUS | Status: DC | PRN
  Administered 2022-09-26: 400 mg via INTRAVENOUS

## 2022-09-26 MED ORDER — CIPROFLOXACIN IN D5W 400 MG/200ML IV SOLN
INTRAVENOUS | Status: AC
  Filled 2022-09-26: qty 200

## 2022-09-26 MED ORDER — FENTANYL CITRATE (PF) 100 MCG/2ML IJ SOLN
INTRAMUSCULAR | Status: DC | PRN
  Administered 2022-09-26: 50 ug via INTRAVENOUS

## 2022-09-26 MED ORDER — LACTATED RINGERS IV SOLN: INTRAVENOUS | Status: DC | PRN

## 2022-09-26 MED ORDER — PROPOFOL 1000 MG/100ML IV EMUL
INTRAVENOUS | Status: AC
  Filled 2022-09-26: qty 100

## 2022-09-26 MED ORDER — HYDROMORPHONE HCL 2 MG PO TABS
1.0000 mg | ORAL_TABLET | ORAL | Status: DC | PRN
  Administered 2022-09-27 – 2022-09-30 (×9): 2 mg via ORAL
  Filled 2022-09-26 (×9): qty 1

## 2022-09-26 MED ORDER — SUGAMMADEX SODIUM 200 MG/2ML IV SOLN
INTRAVENOUS | Status: DC | PRN
  Administered 2022-09-26: 200 mg via INTRAVENOUS

## 2022-09-26 NOTE — Op Note (Addendum)
[facility] Patient Name: Carolyn Hooper Procedure Date: 09/26/2022 MRN: [id number] Attending MD: Corliss Parish , MD, [id number] Date of Birth: 29-Sep-1960 CSN: [id number] Age: 62 Admit Type: Inpatient Procedure:                Upper EUS Indications:              Pancreatic cyst on CT scan, Suspected                            choledocholithiasis, Acute pancreatitis Providers:                Corliss Parish, MD, Margaree Mackintosh, RN,                            Harrington Challenger, Technician Referring MD:              Medicines:                General Anesthesia, Cipro 400 mg IV Complications:            No immediate complications. Estimated Blood Loss:     Estimated blood loss was minimal. Procedure:                Pre-Anesthesia Assessment:                           - Prior to the procedure, a History and Physical                            was performed, and patient medications and                            allergies were reviewed. The patient's tolerance of                            previous anesthesia was also reviewed. The risks                            and benefits of the procedure and the sedation                            options and risks were discussed with the patient.                            All questions were answered, and informed consent                            was obtained. [Anticoagulant Agents] [QMVH Prior to                            Procedure]. [ASA Grade]. After reviewing the risks                            and benefits, the patient was deemed in  satisfactory condition to undergo the procedure.                           After obtaining informed consent, the endoscope was                            passed under direct vision. Throughout the                            procedure, the patient's blood pressure, pulse, and                            oxygen saturations were monitored continuously. The                             [id number]-H190 ([id number]) Olympus endoscope was introduced                            through the mouth, and advanced to the second part                            of duodenum. The [id number]-[id number] ([id number]) Olympus                            duodenoscope was introduced through the mouth, and                            advanced to the area of papilla. The [id number]                            ([id number]) Olympus linear ultrasound scope was                            introduced through the mouth, and advanced to the                            duodenum for ultrasound examination from the                            stomach and duodenum. The upper EUS was                            accomplished without difficulty. The patient                            tolerated the procedure. The [id number]-[id number] ([id number])                            Olympus therapeutic endoscope was introduced                            through the mouth, and advanced to the second part  of duodenum. Scope In: Scope Out: Findings:      ENDOSCOPIC FINDING: :      No gross lesions were noted in the entire esophagus.      The Z-line was irregular and was found 41 cm from the incisors.      A 2 cm hiatal hernia was present.      Patchy mild inflammation characterized by erosions and erythema was       found in the entire examined stomach. Biopsies were taken with a cold       forceps for histology and Helicobacter pylori testing.      No gross lesions were noted in the duodenal bulb, in the first portion       of the duodenum and in the second portion of the duodenum.      The major papilla was normal.      ENDOSONOGRAPHIC FINDING: :      Pancreatic parenchymal abnormalities were noted in the entire pancreas.       These consisted of diffusely increased echogenicity. The pancreatic duct       had prominence within the tail at 2.3 mm but within the rest of the duct       was normal in caliber.      A  hypoechoic lesion suggestive of a cyst was identified in the       pancreatic body. It is not in obvious communication with the pancreatic       duct. The lesion measured 143 mm by 104 mm in maximal cross-sectional       diameter. There was a single compartment without septae. The outer wall       of the lesion was thin. There was no associated mass. There was no       internal debris within the fluid-filled cavity. The decision was made to       create a cystogastrostomy using the AXIOS stent system. Once an       appropriate position in the stomach was identified, the common wall       between the stomach and the cyst was interrogated utilizing color       Doppler imaging to identify interposed vessels. The AXIOS stent and       electrocautery device were introduced through the working channel. The       AXIOS catheter was advanced to the common wall between the stomach and       the cyst. Current was applied to the cautery tip and then the catheter       was advanced into the cyst. A 15 x 10 mm AXIOS stent was placed with the       flanges in close approximation to the walls of the cyst and the stomach       through the cystogastrostomy. The stent was successfully placed. Suction       via Endoscope was performed. A 7 cm 10 Fr double pigtail stent was       placed into the pseudocyst through the cystogastrostomy using a stent       introducer set. The stent was successfully placed.      There was no sign of significant endosonographic abnormality in the       common bile duct (3.4 mm -> 4.8 mm) and mild prominence of the common       hepatic duct (7.8 mm). No biliary sludge or stones were noted. The  gallbladder was difficult to assess as a result of the large pseudocyst.      No malignant-appearing lymph nodes were visualized in the celiac region       (level 20) and porta hepatis region.      The celiac region was visualized. Impression:               EGD impression:                            - No gross lesions in the entire esophagus. Z-line                            irregular, 41 cm from the incisors.                           - 2 cm hiatal hernia.                           - Gastritis. Biopsied.                           - No gross lesions in the duodenal bulb, in the                            first portion of the duodenum and in the second                            portion of the duodenum.                           - Normal major papilla.                           EUS impression:                           - Pancreatic parenchymal abnormalities consisting                            of diffusely increased echogenicity were noted in                            the entire pancreas.                           - A cystic lesion was seen in the pancreatic body.                            Tissue has not been obtained. However, the                            endosonographic appearance is suggestive of a                            pancreatic pseudocyst. Stented.                           -  There was no sign of significant pathology in the                            common bile duct and in the common hepatic duct.                           - No malignant-appearing lymph nodes were                            visualized in the celiac region (level 20) and                            porta hepatis region.                           - Cystogastrostomy was performed. Moderate Sedation:      Not Applicable - Patient had care per Anesthesia. Recommendation:           - The patient will be observed post-procedure,                            until all discharge criteria are met.                           - Return patient to hospital ward for ongoing care.                           - [facility] will continue to follow.                           - Full liquid diet today and if doing well advance                            tomorrow.                           - Continue ciprofloxacin  500 mg twice daily for 2-3                            weeks.                           - Stop PPI until follow-up imaging.                           - No heparin or VTE prophylaxis for 24 hours at                            minimum.                           - Repeat cross-sectional imaging with a CT abdomen                            pancreas protocol in 2 weeks.                           -  Monitor for signs/symptoms of bleeding,                            perforation, and infection. If issues please call                            our number to get further assistance as needed.                           - Patient will need AXIOS/double-pigtail                            cystgastrostomy stent removal in 4 to 6 weeks.                           - The findings and recommendations were discussed                            with the patient.                           - The findings and recommendations were discussed                            with the referring physician. Procedure Code(s):        --- Professional ---                           [phone], Esophagogastroduodenoscopy, flexible,                            transoral; with transmural drainage of pseudocyst                            (includes placement of transmural drainage                            catheter[s]/stent[s], when performed, and                            endoscopic ultrasound, when performed)                           43237, Esophagogastroduodenoscopy, flexible,                            transoral; with endoscopic ultrasound examination                            limited to the esophagus, stomach or duodenum, and                            adjacent structures                           43239, 59, Esophagogastroduodenoscopy, flexible,  transoral; with biopsy, single or multiple Diagnosis Code(s):        --- Professional ---                           K22.89, Other specified disease of esophagus                            K44.9, Diaphragmatic hernia without obstruction or                            gangrene                           K29.70, Gastritis, unspecified, without bleeding                           K86.9, Disease of pancreas, unspecified                           K86.2, Cyst of pancreas                           I89.9, Noninfective disorder of lymphatic vessels                            and lymph nodes, unspecified                           K85.90, Acute pancreatitis without necrosis or                            infection, unspecified CPT copyright 2022 [employer]. All rights reserved. The codes documented in this report are preliminary and upon coder review may  be revised to meet current compliance requirements. Corliss Parish, MD 09/26/2022 4:59:25 PM Number of Addenda: 0

## 2022-09-26 NOTE — Interval H&P Note (Signed)
History and Physical Interval Note:  09/26/2022 3:06 PM  Carolyn Hooper  has presented today for surgery, with the diagnosis of Pancreatic pseudocyst.  The various methods of treatment have been discussed with the patient and family. After consideration of risks, benefits and other options for treatment, the patient has consented to  Procedure(s) with comments: UPPER ESOPHAGEAL ENDOSCOPIC ULTRASOUND (EUS) (N/A) - For cyst drainage probably will need stent as a surgical intervention.  The patient's history has been reviewed, patient examined, no change in status, stable for surgery.  I have reviewed the patient's chart and labs.  Questions were answered to the patient's satisfaction.    The risks of an EUS, including intestinal perforation, bleeding, infection, aspiration, and medication effects were discussed.  When a cystgastrostomy/cystenterostomy is performed as part of the EUS, there is an additional risk of pancreatitis at the rate of about 1-2%.  It was explained that procedure related pancreatitis is typically mild, although at times it can be severe and even life threatening.    [employer]

## 2022-09-26 NOTE — Anesthesia Postprocedure Evaluation (Signed)
Anesthesia Post Note  Patient: Carolyn Hooper  Procedure(s) Performed: UPPER ESOPHAGEAL ENDOSCOPIC ULTRASOUND (EUS) BIOPSY PANCREATIC STENT PLACEMENT BALLOON DILATION     Patient location during evaluation: PACU Anesthesia Type: General Level of consciousness: awake and alert Pain management: pain level controlled Vital Signs Assessment: post-procedure vital signs reviewed and stable Respiratory status: spontaneous breathing, nonlabored ventilation, respiratory function stable and patient connected to nasal cannula oxygen Cardiovascular status: blood pressure returned to baseline and stable Postop Assessment: no apparent nausea or vomiting Anesthetic complications: no  No notable events documented.  Last Vitals:  Vitals:   09/26/22 1705 09/26/22 1727  BP: 138/62 126/64  Pulse: 73 (!) 108  Resp: 14 18  Temp:  36.9 C  SpO2: 96% 96%    Last Pain:  Vitals:   09/26/22 1727  TempSrc: Oral  PainSc:                  Kadee Philyaw L Kinzlee Selvy

## 2022-09-26 NOTE — Progress Notes (Signed)
[facility]                                                                               Carolyn Hooper, is a 62 y.o. female, DOB - June 28, 1960, PIR:[id number] Admit date - 09/21/2022    Outpatient Primary MD for the patient is [name], FNP  LOS - 4  days    Brief summary    62 year old female with non-small cell lung cancer, brain metastasis, focal seizures, diabetes mellitus who presents to the hospital for abdominal pain. She was last hospitalized from 6/19-6/27 with epigastric pain. Sas found to have acute pancreatitis with a pseudocyst felt to be either gallstone pancreatitis versus secondary to immunotherapy.  The patient was started on Decadron due to the possibility of immunotherapy mediated pancreatitis.   She returns to the hospital for abdominal pain which is different from her prior episode of acute pancreatitis.  She describes the pain as dull and achy but stabbing at times.    CT abdomen pelvis revealed an enhancing fluid collection abutting the greater curvature of the stomach that was mildly increased in size, stable hypodense lesions in the pancreas unchanged from prior MRI.    Assessment & Plan    Assessment and Plan:   Abdominal pain. ? From symptomatic pancreatic cyst Plan for EUS today. Continue with creon, pain meds.  Symptomatic management with anti emetics.    Type 2 diabetes mellitus (HCC) Recent A1C of 8.4 in June 2024  CBG (last 3)  Recent Labs    09/25/22 2114 09/26/22 0734 09/26/22 1202  GLUCAP 126* 111* 146*   Resume SSI.    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  No seizures seen.  Pt reports speech abnormalities. Will get SLP eval.    Non-small cell lung cancer (HCC) Follows with Dr. Arbutus Ped Immunotherapy stopped at recent hospitalization due to possibility of link with pancreatitis.    Focal seizure (HCC) Continue keppra Seizure precautions   GAD (generalized anxiety  disorder) Continue klonopin PRN   GERD (gastroesophageal reflux disease) Continue pepcid BID Protonix daily     Estimated body mass index is 34.39 kg/m as calculated from the following:   Height as of this encounter: 5\' 8"  (1.727 m).   Weight as of this encounter: 102.6 kg.  Code Status: full code.  DVT Prophylaxis:  SCDs Start: 09/21/22 2309   Level of Care: Level of care: Med-Surg Family Communication: none at bedside.   Disposition Plan:     Remains inpatient appropriate:  Pending GI work up.  Procedures:  EUS In am.   Consultants:   Gastroenterology.   Antimicrobials:   Anti-infectives (From admission, onward)    None        Medications  Scheduled Meds:  [MAR Hold] Chlorhexidine Gluconate Cloth  6 each Topical Daily   [MAR Hold] famotidine  20 mg Oral BID   [MAR Hold] folic acid  1 mg Oral Daily   [MAR Hold] insulin aspart  0-15 Units Subcutaneous TID WC   [MAR Hold] insulin aspart  2 Units Subcutaneous TID WC   [MAR Hold] insulin glargine-yfgn  15 Units Subcutaneous Daily   [MAR Hold]  levETIRAcetam  1,000 mg Oral BID   [MAR Hold] lipase/protease/amylase  24,000 Units Oral TID WC   [MAR Hold] pantoprazole  40 mg Oral QAC lunch   [MAR Hold] Ensure Max Protein  11 oz Oral BID   Continuous Infusions: PRN Meds:.[MAR Hold] acetaminophen **OR** [MAR Hold] acetaminophen, [MAR Hold] clonazePAM, [MAR Hold] HYDROmorphone, [MAR Hold] lipase/protease/amylase, [MAR Hold] metoprolol tartrate, [MAR Hold] ondansetron **OR** [MAR Hold] ondansetron (ZOFRAN) IV, [MAR Hold] polyethylene glycol, [MAR Hold] senna    Subjective:   Carolyn Hooper was seen and examined today.  Today she reports more pain.   Objective:   Vitals:   09/26/22 0453 09/26/22 0821 09/26/22 1406 09/26/22 1420  BP: 126/72 (!) 116/51 (!) 114/59 120/72  Pulse: 83 73 73 77  Resp: 14 20 20 16   Temp: 98.4 F (36.9 C)  98.6 F (37 C) (!) 97.4 F (36.3 C)  TempSrc: Oral  Oral Temporal  SpO2: 97%  97% 100% 98%  Weight: 102.6 kg   102.6 kg  Height:    5\' 8"  (1.727 m)   No intake or output data in the 24 hours ending 09/26/22 1432  Filed Weights   09/25/22 0726 09/26/22 0453 09/26/22 1420  Weight: 103.4 kg 102.6 kg 102.6 kg     Exam General exam: Ill appearing lady, not in distress.  Respiratory system: Clear to auscultation. Respiratory effort normal. Cardiovascular system: S1 & S2 heard, RRR. No JVD,  Gastrointestinal system: Abdomen is soft , tender, bs+ Central nervous system: Alert and oriented, slow speech. Extremities: no pedal edema. Skin: No rashes,       Data Reviewed:  I have personally reviewed following labs and imaging studies   CBC Lab Results  Component Value Date   WBC 10.0 09/22/2022   RBC 3.29 (L) 09/22/2022   HGB 10.2 (L) 09/22/2022   HCT 32.5 (L) 09/22/2022   MCV 98.8 09/22/2022   MCH 31.0 09/22/2022   PLT 155 09/22/2022   MCHC 31.4 09/22/2022   RDW 18.0 (H) 09/22/2022   LYMPHSABS 0.6 (L) 09/21/2022   MONOABS 0.3 09/21/2022   EOSABS 0.0 09/21/2022   BASOSABS 0.0 09/21/2022     Last metabolic panel Lab Results  Component Value Date   NA 137 09/25/2022   K 4.0 09/25/2022   CL 99 09/25/2022   CO2 30 09/25/2022   BUN 23 09/25/2022   CREATININE 0.82 09/25/2022   GLUCOSE 142 (H) 09/25/2022   GFRNONAA >60 09/25/2022   GFRAA 93 09/21/2019   CALCIUM 8.9 09/25/2022   PHOS 3.3 02/12/2022   PROT 5.4 (L) 09/22/2022   ALBUMIN 2.7 (L) 09/22/2022   LABGLOB 2.3 07/02/2022   AGRATIO 1.6 07/02/2022   BILITOT 0.8 09/22/2022   ALKPHOS 53 09/22/2022   AST 16 09/22/2022   ALT 31 09/22/2022   ANIONGAP 8 09/25/2022    CBG (last 3)  Recent Labs    09/25/22 2114 09/26/22 0734 09/26/22 1202  GLUCAP 126* 111* 146*      Coagulation Profile: Recent Labs  Lab 09/26/22 0508  INR 1.1     Radiology Studies: No results found.     Kathlen Mody M.D. [facility] 09/26/2022, 2:32 PM  Available via Epic secure chat  7am-7pm After 7 pm, please refer to night coverage provider listed on amion.

## 2022-09-26 NOTE — Anesthesia Procedure Notes (Signed)
Procedure Name: Intubation Date/Time: 09/26/2022 3:33 PM  Performed by: Johnette Abraham, CRNAPre-anesthesia Checklist: Patient identified, Emergency Drugs available, Suction available and Patient being monitored Patient Re-evaluated:Patient Re-evaluated prior to induction Oxygen Delivery Method: Circle System Utilized Preoxygenation: Pre-oxygenation with 100% oxygen Induction Type: IV induction, Cricoid Pressure applied and Rapid sequence Ventilation: Mask ventilation without difficulty Laryngoscope Size: Glidescope and 3 Grade View: Grade I Tube type: Oral Tube size: 7.0 mm Number of attempts: 1 Airway Equipment and Method: Stylet and Oral airway Placement Confirmation: ETT inserted through vocal cords under direct vision, positive ETCO2 and breath sounds checked- equal and bilateral Secured at: 22 cm Tube secured with: Tape Dental Injury: Teeth and Oropharynx as per pre-operative assessment

## 2022-09-26 NOTE — Transfer of Care (Signed)
Immediate Anesthesia Transfer of Care Note  Patient: Carolyn Hooper  Procedure(s) Performed: UPPER ESOPHAGEAL ENDOSCOPIC ULTRASOUND (EUS) BIOPSY PANCREATIC STENT PLACEMENT BALLOON DILATION  Patient Location: PACU and Endoscopy Unit  Anesthesia Type:General  Level of Consciousness: awake, alert , oriented, and patient cooperative  Airway & Oxygen Therapy: Patient Spontanous Breathing and Patient connected to face mask oxygen  Post-op Assessment: Report given to RN and Post -op Vital signs reviewed and stable  Post vital signs: Reviewed and stable  Last Vitals:  Vitals Value Taken Time  BP    Temp    Pulse    Resp    SpO2      Last Pain:  Vitals:   09/26/22 1420  TempSrc: Temporal  PainSc: 0-No pain      Patients Stated Pain Goal: 3 (09/23/22 0836)  Complications: No notable events documented.

## 2022-09-27 ENCOUNTER — Other Ambulatory Visit (HOSPITAL_COMMUNITY): Payer: Self-pay

## 2022-09-27 ENCOUNTER — Encounter (HOSPITAL_COMMUNITY): Payer: Self-pay | Admitting: Gastroenterology

## 2022-09-27 DIAGNOSIS — R1012 Left upper quadrant pain: Secondary | ICD-10-CM | POA: Diagnosis not present

## 2022-09-27 DIAGNOSIS — E1169 Type 2 diabetes mellitus with other specified complication: Secondary | ICD-10-CM | POA: Diagnosis not present

## 2022-09-27 DIAGNOSIS — C3490 Malignant neoplasm of unspecified part of unspecified bronchus or lung: Secondary | ICD-10-CM | POA: Diagnosis not present

## 2022-09-27 LAB — COMPREHENSIVE METABOLIC PANEL
ALT: 31 U/L (ref 0–44)
AST: 18 U/L (ref 15–41)
Albumin: 2.7 g/dL — ABNORMAL LOW (ref 3.5–5.0)
Alkaline Phosphatase: 61 U/L (ref 38–126)
Anion gap: 11 (ref 5–15)
BUN: 12 mg/dL (ref 8–23)
CO2: 27 mmol/L (ref 22–32)
Calcium: 8.5 mg/dL — ABNORMAL LOW (ref 8.9–10.3)
Chloride: 100 mmol/L (ref 98–111)
Creatinine, Ser: 0.74 mg/dL (ref 0.44–1.00)
GFR, Estimated: >60 mL/min (ref >60)
Glucose, Bld: 169 mg/dL — ABNORMAL HIGH (ref 70–99)
Potassium: 3.9 mmol/L (ref 3.5–5.1)
Sodium: 138 mmol/L (ref 135–145)
Total Bilirubin: 1.4 mg/dL — ABNORMAL HIGH (ref 0.3–1.2)
Total Protein: 5.7 g/dL — ABNORMAL LOW (ref 6.5–8.1)

## 2022-09-27 LAB — GLUCOSE, CAPILLARY
Glucose-Capillary: 179 mg/dL — ABNORMAL HIGH (ref 70–99)
Glucose-Capillary: 138 mg/dL — ABNORMAL HIGH (ref 70–99)
Glucose-Capillary: 243 mg/dL — ABNORMAL HIGH (ref 70–99)
Glucose-Capillary: 120 mg/dL — ABNORMAL HIGH (ref 70–99)

## 2022-09-27 LAB — CBC
HCT: 35.7 % — ABNORMAL LOW (ref 36.0–46.0)
Hemoglobin: 11.4 g/dL — ABNORMAL LOW (ref 12.0–15.0)
MCH: 31.3 pg (ref 26.0–34.0)
MCHC: 31.9 g/dL (ref 30.0–36.0)
MCV: 98.1 fL (ref 80.0–100.0)
Platelets: 143 10*3/uL — ABNORMAL LOW (ref 150–400)
RBC: 3.64 MIL/uL — ABNORMAL LOW (ref 3.87–5.11)
RDW: 17.8 % — ABNORMAL HIGH (ref 11.5–15.5)
WBC: 8.9 10*3/uL (ref 4.0–10.5)
nRBC: 0 % (ref 0.0–0.2)

## 2022-09-27 LAB — C-REACTIVE PROTEIN: CRP: 16.5 mg/dL — ABNORMAL HIGH (ref <1.0)

## 2022-09-27 MED ORDER — VALACYCLOVIR HCL 500 MG PO TABS
500.0000 mg | ORAL_TABLET | Freq: Two times a day (BID) | ORAL | Status: DC
  Administered 2022-09-27 – 2022-09-30 (×7): 500 mg via ORAL
  Filled 2022-09-27 (×8): qty 1

## 2022-09-27 MED ORDER — ALUM & MAG HYDROXIDE-SIMETH 200-200-20 MG/5ML PO SUSP
30.0000 mL | Freq: Two times a day (BID) | ORAL | Status: DC
  Administered 2022-09-27 – 2022-09-29 (×4): 30 mL via ORAL
  Filled 2022-09-27 (×7): qty 30

## 2022-09-27 NOTE — Progress Notes (Signed)
Inda Merlin 11:37 AM  Subjective: Patient doing fine from her procedure yesterday by Dr. Meridee Score having no belly complaints and we discussed her needing an ERCP for CBD stones since her EUS did not show any we discussed repeat imaging in 2 to 3 weeks followed by either he or I will remove her stent if cyst is okay and right now her main concern is her speech and she will need oncology and neurology to assist with that she has no other complaints  Objective: Vital signs stable afebrile no acute distress abdomen is soft nontender chemistry is okay CBC okay  Assessment: Status post cystogastrostomy doing well  Plan: Will discuss follow-up with Dr. Meridee Score and please call me if I can be of any further assistance with this hospital stay and I wished her well with her other issues and problem and reviewed complaint as above  [facility]  office [phone] After 5PM or if no answer call [phone]

## 2022-09-27 NOTE — Plan of Care (Signed)
  Problem: Metabolic: Goal: Ability to maintain appropriate glucose levels will improve Outcome: Progressing   Problem: Nutritional: Goal: Maintenance of adequate nutrition will improve Outcome: Progressing   Problem: Skin Integrity: Goal: Risk for impaired skin integrity will decrease Outcome: Progressing   Problem: Clinical Measurements: Goal: Will remain free from infection Outcome: Progressing   Problem: Clinical Measurements: Goal: Diagnostic test results will improve Outcome: Progressing   Problem: Clinical Measurements: Goal: Cardiovascular complication will be avoided Outcome: Progressing   Problem: Activity: Goal: Risk for activity intolerance will decrease Outcome: Progressing   Problem: Nutrition: Goal: Adequate nutrition will be maintained Outcome: Progressing   Problem: Coping: Goal: Level of anxiety will decrease Outcome: Progressing   Problem: Elimination: Goal: Will not experience complications related to bowel motility Outcome: Progressing   Problem: Safety: Goal: Ability to remain free from injury will improve Outcome: Progressing

## 2022-09-27 NOTE — Progress Notes (Signed)
[facility]                                                                               Carolyn Hooper, is a 62 y.o. female, DOB - 03/08/1961, ZOX:[id number] Admit date - 09/21/2022    Outpatient Primary MD for the patient is [name], FNP  LOS - 5  days    Brief summary    62 year old female with non-small cell lung cancer, brain metastasis, focal seizures, diabetes mellitus who presents to the hospital for abdominal pain. She was last hospitalized from 6/19-6/27 with epigastric pain. Sas found to have acute pancreatitis with a pseudocyst felt to be either gallstone pancreatitis versus secondary to immunotherapy.  The patient was started on Decadron due to the possibility of immunotherapy mediated pancreatitis.   She returns to the hospital for abdominal pain which is different from her prior episode of acute pancreatitis.  She describes the pain as dull and achy but stabbing at times.    CT abdomen pelvis revealed an enhancing fluid collection abutting the greater curvature of the stomach that was mildly increased in size, stable hypodense lesions in the pancreas unchanged from prior MRI.    Assessment & Plan    Assessment and Plan:   Abdominal pain. ? From symptomatic pancreatic cyst Underwent EUS , showing a cystic lesions in the pancreatic body, underwent stent placement. Gastritis, 2 cm Hiatal hernia. GI recommends advance diet as tolerated, and to stop PPI, . Repeat Cross sectional imaging with a CT abd / pancreas in 2 weeks Patient will need AXIOS/double-pigtail cystogastrostomy stent removal in 4 to 6 weeks.  Continue with ciprofloxacin 500 mg BID For 2 to 3 weeks.  Continue with creon, pain meds.  Symptomatic management with anti emetics.    Type 2 diabetes mellitus (HCC) Recent A1C of 8.4 in June 2024  CBG (last 3)  Recent Labs    09/26/22 2135 09/27/22 0741 09/27/22 1137  GLUCAP 186* 179* 138*   Resume SSI.  Advance diet as tolerated.     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  No seizures seen.  Intermittent speech abnormalities , . Speech and cog eval. Ordered.  Therapy evaluations ordered.     Non-small cell lung cancer (HCC) Follows with Dr. Arbutus Ped Immunotherapy stopped at recent hospitalization due to possibility of link with pancreatitis.    Focal seizure (HCC) Continue keppra Seizure precautions   GAD (generalized anxiety disorder) Continue klonopin PRN   GERD (gastroesophageal reflux disease) Continue pepcid BID     Estimated body mass index is 34.39 kg/m as calculated from the following:   Height as of this encounter: 5\' 8"  (1.727 m).   Weight as of this encounter: 102.6 kg.  Code Status: full code.  DVT Prophylaxis:  SCDs Start: 09/21/22 2309   Level of Care: Level of care: Med-Surg Family Communication: family  at bedside.   Disposition Plan:     Remains inpatient appropriate:  possible d/c tomorrow.   Procedures:  EUS In am.   Consultants:   Gastroenterology.   Antimicrobials:   Anti-infectives (From admission, onward)    Start     Dose/Rate Route Frequency  Ordered Stop   09/27/22 1330  valACYclovir (VALTREX) tablet 500 mg        500 mg Oral 2 times daily 09/27/22 1236     09/27/22 0400  ciprofloxacin (CIPRO) tablet 500 mg        500 mg Oral Every 12 hours 09/26/22 1704          Medications  Scheduled Meds:  alum & mag hydroxide-simeth  30 mL Oral BID   Chlorhexidine Gluconate Cloth  6 each Topical Daily   ciprofloxacin  500 mg Oral Q12H   famotidine  20 mg Oral BID   folic acid  1 mg Oral Daily   insulin aspart  0-15 Units Subcutaneous TID WC   insulin aspart  2 Units Subcutaneous TID WC   insulin glargine-yfgn  15 Units Subcutaneous Daily   levETIRAcetam  1,000 mg Oral BID   lipase/protease/amylase  24,000 Units Oral TID WC   Ensure Max Protein  11 oz Oral BID   valACYclovir  500 mg Oral BID   Continuous Infusions: PRN  Meds:.acetaminophen **OR** acetaminophen, clonazePAM, HYDROmorphone, lipase/protease/amylase, metoprolol tartrate, ondansetron **OR** ondansetron (ZOFRAN) IV, polyethylene glycol, senna    Subjective:   Carolyn Hooper was seen and examined today.   In good spirits today, sister at bedside, .  Objective:   Vitals:   09/26/22 2038 09/27/22 0300 09/27/22 0437 09/27/22 1248  BP: (!) 135/57  139/74 (!) 124/51  Pulse: 71  81 73  Resp: 18  18 18   Temp: 99.3 F (37.4 C)  98.3 F (36.8 C) 98.3 F (36.8 C)  TempSrc: Oral  Oral Oral  SpO2: 94%  96% 98%  Weight:  102.6 kg    Height:  5\' 8"  (1.727 m)      Intake/Output Summary (Last 24 hours) at 09/27/2022 1434 Last data filed at 09/26/2022 1636 Gross per 24 hour  Intake 750 ml  Output 0 ml  Net 750 ml    Filed Weights   09/26/22 0453 09/26/22 1420 09/27/22 0300  Weight: 102.6 kg 102.6 kg 102.6 kg     Exam General exam: Appears calm and comfortable  Respiratory system: Clear to auscultation. Respiratory effort normal. Cardiovascular system: S1 & S2 heard, RRR. No JVD, Gastrointestinal system: Abdomen is nondistended, soft and nontender.  Central nervous system: Alert and oriented. No focal neurological deficits. Extremities: Symmetric 5 x 5 power. Skin: No rashes,  Psychiatry: Mood & affect appropriate.       Data Reviewed:  I have personally reviewed following labs and imaging studies   CBC Lab Results  Component Value Date   WBC 8.9 09/27/2022   RBC 3.64 (L) 09/27/2022   HGB 11.4 (L) 09/27/2022   HCT 35.7 (L) 09/27/2022   MCV 98.1 09/27/2022   MCH 31.3 09/27/2022   PLT 143 (L) 09/27/2022   MCHC 31.9 09/27/2022   RDW 17.8 (H) 09/27/2022   LYMPHSABS 0.6 (L) 09/21/2022   MONOABS 0.3 09/21/2022   EOSABS 0.0 09/21/2022   BASOSABS 0.0 09/21/2022     Last metabolic panel Lab Results  Component Value Date   NA 138 09/27/2022   K 3.9 09/27/2022   CL 100 09/27/2022   CO2 27 09/27/2022   BUN 12 09/27/2022    CREATININE 0.74 09/27/2022   GLUCOSE 169 (H) 09/27/2022   GFRNONAA >60 09/27/2022   GFRAA 93 09/21/2019   CALCIUM 8.5 (L) 09/27/2022   PHOS 3.3 02/12/2022   PROT 5.7 (L) 09/27/2022   ALBUMIN 2.7 (L) 09/27/2022   LABGLOB  2.3 07/02/2022   AGRATIO 1.6 07/02/2022   BILITOT 1.4 (H) 09/27/2022   ALKPHOS 61 09/27/2022   AST 18 09/27/2022   ALT 31 09/27/2022   ANIONGAP 11 09/27/2022    CBG (last 3)  Recent Labs    09/26/22 2135 09/27/22 0741 09/27/22 1137  GLUCAP 186* 179* 138*      Coagulation Profile: Recent Labs  Lab 09/26/22 0508  INR 1.1     Radiology Studies: No results found.     Kathlen Mody M.D. [facility] 09/27/2022, 2:34 PM  Available via Epic secure chat 7am-7pm After 7 pm, please refer to night coverage provider listed on amion.

## 2022-09-28 DIAGNOSIS — R1012 Left upper quadrant pain: Secondary | ICD-10-CM | POA: Diagnosis not present

## 2022-09-28 DIAGNOSIS — E1169 Type 2 diabetes mellitus with other specified complication: Secondary | ICD-10-CM | POA: Diagnosis not present

## 2022-09-28 DIAGNOSIS — C3490 Malignant neoplasm of unspecified part of unspecified bronchus or lung: Secondary | ICD-10-CM | POA: Diagnosis not present

## 2022-09-28 LAB — GLUCOSE, CAPILLARY
Glucose-Capillary: 145 mg/dL — ABNORMAL HIGH (ref 70–99)
Glucose-Capillary: 138 mg/dL — ABNORMAL HIGH (ref 70–99)
Glucose-Capillary: 135 mg/dL — ABNORMAL HIGH (ref 70–99)
Glucose-Capillary: 90 mg/dL (ref 70–99)

## 2022-09-28 LAB — HEMOGLOBIN AND HEMATOCRIT, BLOOD
HCT: 32.4 % — ABNORMAL LOW (ref 36.0–46.0)
Hemoglobin: 10.2 g/dL — ABNORMAL LOW (ref 12.0–15.0)

## 2022-09-28 MED ORDER — HYDROMORPHONE HCL 1 MG/ML IJ SOLN
0.2500 mg | Freq: Two times a day (BID) | INTRAMUSCULAR | Status: DC | PRN
  Administered 2022-09-28: 0.25 mg via INTRAVENOUS
  Filled 2022-09-28: qty 0.5

## 2022-09-28 MED ORDER — HYDROXYZINE HCL 10 MG PO TABS
10.0000 mg | ORAL_TABLET | Freq: Three times a day (TID) | ORAL | Status: DC | PRN
  Administered 2022-09-29: 10 mg via ORAL
  Filled 2022-09-28 (×3): qty 1

## 2022-09-28 MED ORDER — ALPRAZOLAM 0.5 MG PO TABS: 1.0000 mg | ORAL_TABLET | Freq: Once | ORAL | Status: DC

## 2022-09-28 MED ORDER — SODIUM CHLORIDE 0.9 % IV SOLN: 12.5000 mg | Freq: Four times a day (QID) | INTRAVENOUS | Status: DC | PRN

## 2022-09-28 MED ORDER — ALBUTEROL SULFATE (2.5 MG/3ML) 0.083% IN NEBU
2.5000 mg | INHALATION_SOLUTION | Freq: Four times a day (QID) | RESPIRATORY_TRACT | Status: DC | PRN
  Filled 2022-09-28: qty 3

## 2022-09-28 NOTE — Evaluation (Signed)
SLP Cancellation Note  Patient Details Name: GEM SAYSON MRN: [id number] DOB: 1960/08/23   Cancelled treatment:       Reason Eval/Treat Not Completed: Other (comment) (patient nauseated, will plan to see as schedule permits)  Rolena Infante, MS [facility] SLP Acute Rehab Services Office [phone]  Chales Abrahams 09/28/2022, 4:51 PM

## 2022-09-28 NOTE — Evaluation (Addendum)
Occupational Therapy Evaluation Patient Details Name: Carolyn Hooper MRN: [id number] DOB: 1960/09/20 Today's Date: 09/28/2022   History of Present Illness Pt is a 62 y/o F presenting ot ED On 7/6 with abdominal pain, recently hospitalized 6/19-6/27 with epigastric pain. CT abdomen revealed enhancing fluid collection in stomach. PMH includes non-small cell lung CA, brain metastasis, focal seizures, DM   Clinical Impression   Pt reports ind at baseline with ADLs, uses cane for mobility, lives alone but reports she has an aide (that cannot provide physical assist but "can call 911 if needed" per pt), but her family lives close and can provide PRN assist. Pt limited by nausea this session, needing min guard-min A for ADLs, min guard for bed mobility and supervision for transfers without AD. Pt able to stand x2 from EOB and take 2-3 side steps before needing to lie down secondary to nausea. Pt presenting with impairments listed below, will follow acutely. Recommend HHOT at d/c.       Recommendations for follow up therapy are one component of a multi-disciplinary discharge planning process, led by the attending physician.  Recommendations may be updated based on patient status, additional functional criteria and insurance authorization.   Assistance Recommended at Discharge Intermittent Supervision/Assistance  Patient can return home with the following A little help with walking and/or transfers;A little help with bathing/dressing/bathroom;Assistance with cooking/housework;Help with stairs or ramp for entrance;Direct supervision/assist for medications management;Direct supervision/assist for financial management;Assist for transportation    Functional Status Assessment  Patient has had a recent decline in their functional status and demonstrates the ability to make significant improvements in function in a reasonable and predictable amount of time.  Equipment Recommendations  None recommended by OT  (pt has all needed DME)    Recommendations for Other Services PT consult     Precautions / Restrictions Precautions Precautions: Fall Restrictions Weight Bearing Restrictions: No      Mobility Bed Mobility Overal bed mobility: Needs Assistance Bed Mobility: Sidelying to Sit, Sit to Sidelying   Sidelying to sit: Min guard     Sit to sidelying: Min guard      Transfers Overall transfer level: Needs assistance Equipment used: None Transfers: Sit to/from Stand Sit to Stand: Supervision           General transfer comment: stood x2 with minimal steps at EOB, pt declining further mobility due to nausea      Balance Overall balance assessment: Needs assistance Sitting-balance support: Feet supported Sitting balance-Leahy Scale: Good     Standing balance support: During functional activity Standing balance-Leahy Scale: Fair Standing balance comment: static stands, takes few steps without assist                           ADL either performed or assessed with clinical judgement   ADL Overall ADL's : Needs assistance/impaired Eating/Feeding: Set up;Sitting   Grooming: Min guard;Sitting;Standing   Upper Body Bathing: Minimal assistance;Sitting   Lower Body Bathing: Minimal assistance;Sitting/lateral leans;Sit to/from stand   Upper Body Dressing : Minimal assistance;Sitting   Lower Body Dressing: Minimal assistance;Sitting/lateral leans;Sit to/from stand   Toilet Transfer: Minimal assistance;Ambulation   Toileting- Clothing Manipulation and Hygiene: Min guard       Functional mobility during ADLs: Minimal assistance       Vision   Vision Assessment?: No apparent visual deficits     Perception Perception Perception Tested?: No   Praxis Praxis Praxis tested?: Not tested    Pertinent  Vitals/Pain Pain Assessment Pain Assessment: Faces Pain Score: 5  Faces Pain Scale: Hurts little more Pain Location: L side Pain Descriptors /  Indicators: Discomfort Pain Intervention(s): Limited activity within patient's tolerance, Monitored during session, Repositioned     Hand Dominance Right   Extremity/Trunk Assessment Upper Extremity Assessment Upper Extremity Assessment: Overall WFL for tasks assessed   Lower Extremity Assessment Lower Extremity Assessment: Defer to PT evaluation   Cervical / Trunk Assessment Cervical / Trunk Assessment: Normal   Communication Communication Communication: Expressive difficulties (delayed responses)   Cognition Arousal/Alertness: Awake/alert Behavior During Therapy: WFL for tasks assessed/performed, Flat affect Overall Cognitive Status: Impaired/Different from baseline Area of Impairment: Problem solving, Attention, Following commands                   Current Attention Level: Focused   Following Commands: Follows one step commands with increased time     Problem Solving: Slow processing General Comments: some delayed responses,and mild wordfinding difficulties noted     General Comments  VSS on RA    Exercises     Shoulder Instructions      Home Living Family/patient expects to be discharged to:: Private residence Living Arrangements: Non-relatives/Friends (aide that provides 24/7 assist) Available Help at Discharge: Family;Available PRN/intermittently Type of Home: Mobile home Home Access: Ramped entrance     Home Layout: One level     Bathroom Shower/Tub: Producer, television/film/video: Handicapped height     Home Equipment: Cane - single point;Shower Counsellor (2 wheels)   Additional Comments: possible plan to d/c to her mother's home per pt      Prior Functioning/Environment Prior Level of Function : Independent/Modified Independent;Working/employed;Driving             Mobility Comments: uses cane outside the home ADLs Comments: ind; drives, works        OT Problem List: Decreased strength;Decreased range of  motion;Decreased activity tolerance;Impaired balance (sitting and/or standing);Decreased cognition;Decreased safety awareness      OT Treatment/Interventions: Self-care/ADL training;Therapeutic exercise;DME and/or AE instruction;Energy conservation;Therapeutic activities;Patient/family education;Balance training;Cognitive remediation/compensation;Visual/perceptual remediation/compensation    OT Goals(Current goals can be found in the care plan section) Acute Rehab OT Goals Patient Stated Goal: none stated OT Goal Formulation: With patient Time For Goal Achievement: 10/12/22 Potential to Achieve Goals: Good ADL Goals Pt Will Perform Upper Body Dressing: with modified independence;sitting Pt Will Perform Lower Body Dressing: with modified independence;sitting/lateral leans;sit to/from stand Pt Will Transfer to Toilet: with modified independence;ambulating;regular height toilet Pt Will Perform Tub/Shower Transfer: Shower transfer;Tub transfer;with modified independence;ambulating;shower seat Additional ADL Goal #1: pt will be able to stand x10 min for functional task in order to improve activity tolerance for ADLs  OT Frequency: Min 1X/week    Co-evaluation              AM-PAC OT "6 Clicks" Daily Activity     Outcome Measure Help from another person eating meals?: A Little Help from another person taking care of personal grooming?: A Little Help from another person toileting, which includes using toliet, bedpan, or urinal?: A Little Help from another person bathing (including washing, rinsing, drying)?: A Little Help from another person to put on and taking off regular upper body clothing?: A Little Help from another person to put on and taking off regular lower body clothing?: A Little 6 Click Score: 18   End of Session Nurse Communication: Mobility status  Activity Tolerance: Patient tolerated treatment well Patient left: in bed;with call bell/phone  within reach;with bed alarm  set  OT Visit Diagnosis: Unsteadiness on feet (R26.81);Other abnormalities of gait and mobility (R26.89);Muscle weakness (generalized) (M62.81)                Time: 5621-3086 OT Time Calculation (min): 16 min Charges:  OT General Charges $OT Visit: 1 Visit OT Evaluation $OT Eval Low Complexity: 1 Low  Carver Fila, OTD, OTR/L SecureChat Preferred Acute Rehab [phone]   Carver Fila Koonce 09/28/2022, 12:34 PM

## 2022-09-28 NOTE — Progress Notes (Signed)
PT Cancellation Note  Patient Details Name: Carolyn Hooper MRN: [id number] DOB: 1960-06-04   Cancelled Treatment:    Reason Eval/Treat Not Completed: (P) Pain limiting ability to participate. Pt reporting 5/10 stomach pain and recent passing of bright red blood in the stool as well as nausea, requesting deferrment until tomorrow. We will follow up as schedule and pt status allows which  will be another day.  Jamesetta Geralds, PT, DPT [facility] Rehabilitation Department Office: [phone]   Jamesetta Geralds 09/28/2022, 3:11 PM

## 2022-09-28 NOTE — Progress Notes (Signed)
[facility]                                                                               Carolyn Hooper, is a 62 y.o. female, DOB - 1960/04/25, ZOX:[id number] Admit date - 09/21/2022    Outpatient Primary MD for the patient is [name], FNP  LOS - 6  days    Brief summary    62 year old female with non-small cell lung cancer, brain metastasis, focal seizures, diabetes mellitus who presents to the hospital for abdominal pain. She was last hospitalized from 6/19-6/27 with epigastric pain. Sas found to have acute pancreatitis with a pseudocyst felt to be either gallstone pancreatitis versus secondary to immunotherapy.  The patient was started on Decadron due to the possibility of immunotherapy mediated pancreatitis.   She returns to the hospital for abdominal pain which is different from her prior episode of acute pancreatitis.  She describes the pain as dull and achy but stabbing at times.    CT abdomen pelvis revealed an enhancing fluid collection abutting the greater curvature of the stomach that was mildly increased in size, stable hypodense lesions in the pancreas unchanged from prior MRI.    Assessment & Plan    Assessment and Plan:   Abdominal pain. ? From symptomatic pancreatic cyst Underwent EUS , showing a cystic lesions in the pancreatic body, underwent stent placement. Gastritis, 2 cm Hiatal hernia.  GI recommends stopping PPI. Repeat Cross sectional imaging with a CT abd / pancreas in 2 weeks Patient will need AXIOS/double-pigtail cystogastrostomy stent removal in 4 to 6 weeks.  Continue with ciprofloxacin 500 mg BID For 2 to 3 weeks.  Continue with creon, pain meds.  Symptomatic management with anti emetics.  Advancing diet diet as tolerated. On carb modified diet today.    Type 2 diabetes mellitus (HCC) Recent A1C of 8.4 in June 2024  CBG (last 3)  Recent Labs    09/27/22 2208 09/28/22 0739 09/28/22 1158  GLUCAP 120* 145* 138*   Resume  SSI.  Advance diet as tolerated.    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  No seizures seen.  Intermittent speech abnormalities  ordered MRI of the brain with and without contrast for further evaluation, . Speech and cog eval. Ordered.  Therapy evaluations ordered.     Non-small cell lung cancer (HCC) Follows with Dr. Arbutus Ped Immunotherapy stopped at recent hospitalization due to possibility of link with pancreatitis.    Focal seizure (HCC) Continue keppra Seizure precautions   GAD (generalized anxiety disorder) Continue klonopin PRN   GERD (gastroesophageal reflux disease) Continue pepcid BID     Estimated body mass index is 33.65 kg/m as calculated from the following:   Height as of this encounter: 5\' 8"  (1.727 m).   Weight as of this encounter: 100.4 kg.  Code Status: full code.  DVT Prophylaxis:  SCDs Start: 09/21/22 2309   Level of Care: Level of care: Med-Surg Family Communication: none at bedside.   Disposition Plan:     Remains inpatient appropriate:  discharge once she is able to tolerate regular diet.   Procedures:  EUS  MRI brain with and without  contrast.   Consultants:   Gastroenterology.   Antimicrobials:   Anti-infectives (From admission, onward)    Start     Dose/Rate Route Frequency Ordered Stop   09/27/22 1330  valACYclovir (VALTREX) tablet 500 mg        500 mg Oral 2 times daily 09/27/22 1236     09/27/22 0400  ciprofloxacin (CIPRO) tablet 500 mg        500 mg Oral Every 12 hours 09/26/22 1704          Medications  Scheduled Meds:  ALPRAZolam  1 mg Oral Once   alum & mag hydroxide-simeth  30 mL Oral BID   Chlorhexidine Gluconate Cloth  6 each Topical Daily   ciprofloxacin  500 mg Oral Q12H   famotidine  20 mg Oral BID   folic acid  1 mg Oral Daily   insulin aspart  0-15 Units Subcutaneous TID WC   insulin aspart  2 Units Subcutaneous TID WC   insulin glargine-yfgn  15 Units Subcutaneous Daily    levETIRAcetam  1,000 mg Oral BID   lipase/protease/amylase  24,000 Units Oral TID WC   Ensure Max Protein  11 oz Oral BID   valACYclovir  500 mg Oral BID   Continuous Infusions:  promethazine (PHENERGAN) injection (IM or IVPB)     PRN Meds:.acetaminophen **OR** acetaminophen, clonazePAM, HYDROmorphone, lipase/protease/amylase, metoprolol tartrate, ondansetron **OR** ondansetron (ZOFRAN) IV, polyethylene glycol, promethazine (PHENERGAN) injection (IM or IVPB), senna    Subjective:   Carolyn Hooper was seen and examined today.   Difficulty with speech this morning.   Objective:   Vitals:   09/27/22 1248 09/27/22 2101 09/28/22 0533 09/28/22 0538  BP: (!) 124/51 (!) 153/56 (!) 117/46   Pulse: 73 84 71   Resp: 18 16 17    Temp: 98.3 F (36.8 C) 98.4 F (36.9 C) 98.2 F (36.8 C)   TempSrc: Oral Oral Oral   SpO2: 98% 98% 96%   Weight:    100.4 kg  Height:       No intake or output data in the 24 hours ending 09/28/22 1255   Filed Weights   09/26/22 1420 09/27/22 0300 09/28/22 0538  Weight: 102.6 kg 102.6 kg 100.4 kg     Exam General exam: Appears calm and comfortable  Respiratory system: Clear to auscultation. Respiratory effort normal. Cardiovascular system: S1 & S2 heard, RRR. No JVD, murmurs, rubs, gallops or clicks. No pedal edema. Gastrointestinal system: Abdomen is nondistended, soft and nontender.  Central nervous system: Alert and oriented. No focal neurological deficits. Extremities: Symmetric 5 x 5 power. Skin: No rashes,  Psychiatry: Mood & affect appropriate.        Data Reviewed:  I have personally reviewed following labs and imaging studies   CBC Lab Results  Component Value Date   WBC 8.9 09/27/2022   RBC 3.64 (L) 09/27/2022   HGB 11.4 (L) 09/27/2022   HCT 35.7 (L) 09/27/2022   MCV 98.1 09/27/2022   MCH 31.3 09/27/2022   PLT 143 (L) 09/27/2022   MCHC 31.9 09/27/2022   RDW 17.8 (H) 09/27/2022   LYMPHSABS 0.6 (L) 09/21/2022   MONOABS 0.3  09/21/2022   EOSABS 0.0 09/21/2022   BASOSABS 0.0 09/21/2022     Last metabolic panel Lab Results  Component Value Date   NA 138 09/27/2022   K 3.9 09/27/2022   CL 100 09/27/2022   CO2 27 09/27/2022   BUN 12 09/27/2022   CREATININE 0.74 09/27/2022   GLUCOSE 169 (H)  09/27/2022   GFRNONAA >60 09/27/2022   GFRAA 93 09/21/2019   CALCIUM 8.5 (L) 09/27/2022   PHOS 3.3 02/12/2022   PROT 5.7 (L) 09/27/2022   ALBUMIN 2.7 (L) 09/27/2022   LABGLOB 2.3 07/02/2022   AGRATIO 1.6 07/02/2022   BILITOT 1.4 (H) 09/27/2022   ALKPHOS 61 09/27/2022   AST 18 09/27/2022   ALT 31 09/27/2022   ANIONGAP 11 09/27/2022    CBG (last 3)  Recent Labs    09/27/22 2208 09/28/22 0739 09/28/22 1158  GLUCAP 120* 145* 138*      Coagulation Profile: Recent Labs  Lab 09/26/22 0508  INR 1.1     Radiology Studies: No results found.     Kathlen Mody M.D. [facility] 09/28/2022, 12:55 PM  Available via Epic secure chat 7am-7pm After 7 pm, please refer to night coverage provider listed on amion.

## 2022-09-29 ENCOUNTER — Inpatient Hospital Stay (HOSPITAL_COMMUNITY): Payer: BC Managed Care – PPO

## 2022-09-29 DIAGNOSIS — R1012 Left upper quadrant pain: Secondary | ICD-10-CM | POA: Diagnosis not present

## 2022-09-29 DIAGNOSIS — E1169 Type 2 diabetes mellitus with other specified complication: Secondary | ICD-10-CM | POA: Diagnosis not present

## 2022-09-29 DIAGNOSIS — C3490 Malignant neoplasm of unspecified part of unspecified bronchus or lung: Secondary | ICD-10-CM | POA: Diagnosis not present

## 2022-09-29 LAB — CBC WITH DIFFERENTIAL/PLATELET
Abs Immature Granulocytes: 0.05 10*3/uL (ref 0.00–0.07)
Basophils Absolute: 0 10*3/uL (ref 0.0–0.1)
Basophils Relative: 0 %
Eosinophils Absolute: 0.1 10*3/uL (ref 0.0–0.5)
Eosinophils Relative: 1 %
HCT: 32.8 % — ABNORMAL LOW (ref 36.0–46.0)
Hemoglobin: 10.3 g/dL — ABNORMAL LOW (ref 12.0–15.0)
Immature Granulocytes: 1 %
Lymphocytes Relative: 23 %
Lymphs Abs: 1.7 10*3/uL (ref 0.7–4.0)
MCH: 31.1 pg (ref 26.0–34.0)
MCHC: 31.4 g/dL (ref 30.0–36.0)
MCV: 99.1 fL (ref 80.0–100.0)
Monocytes Absolute: 0.7 10*3/uL (ref 0.1–1.0)
Monocytes Relative: 9 %
Neutro Abs: 5.1 10*3/uL (ref 1.7–7.7)
Neutrophils Relative %: 66 %
Platelets: 139 10*3/uL — ABNORMAL LOW (ref 150–400)
RBC: 3.31 MIL/uL — ABNORMAL LOW (ref 3.87–5.11)
RDW: 17.2 % — ABNORMAL HIGH (ref 11.5–15.5)
WBC: 7.6 10*3/uL (ref 4.0–10.5)
nRBC: 0 % (ref 0.0–0.2)

## 2022-09-29 LAB — GLUCOSE, CAPILLARY
Glucose-Capillary: 130 mg/dL — ABNORMAL HIGH (ref 70–99)
Glucose-Capillary: 184 mg/dL — ABNORMAL HIGH (ref 70–99)
Glucose-Capillary: 119 mg/dL — ABNORMAL HIGH (ref 70–99)
Glucose-Capillary: 152 mg/dL — ABNORMAL HIGH (ref 70–99)

## 2022-09-29 LAB — BASIC METABOLIC PANEL
Anion gap: 10 (ref 5–15)
BUN: 10 mg/dL (ref 8–23)
CO2: 27 mmol/L (ref 22–32)
Calcium: 8.1 mg/dL — ABNORMAL LOW (ref 8.9–10.3)
Chloride: 99 mmol/L (ref 98–111)
Creatinine, Ser: 0.55 mg/dL (ref 0.44–1.00)
GFR, Estimated: >60 mL/min (ref >60)
Glucose, Bld: 126 mg/dL — ABNORMAL HIGH (ref 70–99)
Potassium: 3.3 mmol/L — ABNORMAL LOW (ref 3.5–5.1)
Sodium: 136 mmol/L (ref 135–145)

## 2022-09-29 MED ORDER — POTASSIUM CHLORIDE CRYS ER 20 MEQ PO TBCR
40.0000 meq | EXTENDED_RELEASE_TABLET | Freq: Once | ORAL | Status: AC
  Administered 2022-09-29: 40 meq via ORAL
  Filled 2022-09-29: qty 2

## 2022-09-29 MED ORDER — SODIUM CHLORIDE 0.9 % IV SOLN
2.0000 g | INTRAVENOUS | Status: DC
  Administered 2022-09-29: 2 g via INTRAVENOUS
  Filled 2022-09-29: qty 20

## 2022-09-29 MED ORDER — GADOBUTROL 1 MMOL/ML IV SOLN
10.0000 mL | Freq: Once | INTRAVENOUS | Status: AC | PRN
  Administered 2022-09-29: 10 mL via INTRAVENOUS

## 2022-09-29 MED ORDER — SODIUM CHLORIDE 0.9 % IV SOLN
100.0000 mg | INTRAVENOUS | Status: DC
  Administered 2022-09-29 – 2022-09-30 (×2): 100 mg via INTRAVENOUS
  Filled 2022-09-29 (×2): qty 5

## 2022-09-29 NOTE — Progress Notes (Signed)
[facility]                                                                               Carolyn Hooper, is a 62 y.o. female, DOB - Aug 15, 1960, BJY:[id number] Admit date - 09/21/2022    Outpatient Primary MD for the patient is [name], FNP  LOS - 7  days    Brief summary    62 year old female with non-small cell lung cancer, brain metastasis, focal seizures, diabetes mellitus who presents to the hospital for abdominal pain. She was last hospitalized from 6/19-6/27 with epigastric pain. Sas found to have acute pancreatitis with a pseudocyst felt to be either gallstone pancreatitis versus secondary to immunotherapy.  The patient was started on Decadron due to the possibility of immunotherapy mediated pancreatitis.   She returns to the hospital for abdominal pain which is different from her prior episode of acute pancreatitis.  She describes the pain as dull and achy but stabbing at times.    CT abdomen pelvis revealed an enhancing fluid collection abutting the greater curvature of the stomach that was mildly increased in size, stable hypodense lesions in the pancreas unchanged from prior MRI.    Assessment & Plan    Assessment and Plan:   Abdominal pain. ? From symptomatic pancreatic cyst Underwent EUS , showing a cystic lesions in the pancreatic body, underwent stent placement. Gastritis, 2 cm Hiatal hernia.  GI recommends stopping PPI. Repeat Cross sectional imaging with a CT abd / pancreas in 2 weeks Patient will need AXIOS/double-pigtail cystogastrostomy stent removal in 4 to 6 weeks.  Continue with ciprofloxacin 500 mg BID For 2 to 3 weeks.  Continue with creon, pain meds.  Symptomatic management with anti emetics.  Advancing diet diet as tolerated. On carb modified diet today.    Type 2 diabetes mellitus (HCC) Recent A1C of 8.4 in June 2024  CBG (last 3)  Recent Labs    09/28/22 2053 09/29/22 0735 09/29/22 1213  GLUCAP 90 130* 184*   Resume  SSI.  Advance diet as tolerated.    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  No seizures seen.  Intermittent speech abnormalities  ordered MRI of the brain with and without contrast for further evaluation, . Speech and cog eval. Ordered.  Therapy evaluations ordered.     Non-small cell lung cancer (HCC) Follows with Dr. Arbutus Ped Immunotherapy stopped at recent hospitalization due to possibility of link with pancreatitis.    Focal seizure (HCC) Continue keppra, no seizures . Seizure precautions   GAD (generalized anxiety disorder) Continue klonopin PRN   GERD (gastroesophageal reflux disease) Continue pepcid BID  Poor oral intake, some nausea, no vomiting.  Able to tolerate liquids today.  Held today's semglee.  Continue to monitor.  Speck of bright red blood per rectum once yesterday: No more episodes  after that.  H&H remains stable.  Stool for occult blood ordered and pending.     Estimated body mass index is 33.65 kg/m as calculated from the following:   Height as of this encounter: 5\' 8"  (1.727 m).   Weight as of this encounter: 100.4 kg.  Code Status: full code.  DVT Prophylaxis:  SCDs Start: 09/21/22 2309   Level of Care: Level of care: Med-Surg Family Communication: none at bedside.   Disposition Plan:     Remains inpatient appropriate:  discharge once she is able to tolerate regular diet.   Procedures:  EUS  MRI brain with and without contrast.   Consultants:   Gastroenterology.   Antimicrobials:   Anti-infectives (From admission, onward)    Start     Dose/Rate Route Frequency Ordered Stop   09/27/22 1330  valACYclovir (VALTREX) tablet 500 mg        500 mg Oral 2 times daily 09/27/22 1236     09/27/22 0400  ciprofloxacin (CIPRO) tablet 500 mg        500 mg Oral Every 12 hours 09/26/22 1704          Medications  Scheduled Meds:  ALPRAZolam  1 mg Oral Once   alum & mag hydroxide-simeth  30 mL Oral BID    Chlorhexidine Gluconate Cloth  6 each Topical Daily   ciprofloxacin  500 mg Oral Q12H   famotidine  20 mg Oral BID   folic acid  1 mg Oral Daily   insulin aspart  0-15 Units Subcutaneous TID WC   insulin aspart  2 Units Subcutaneous TID WC   insulin glargine-yfgn  15 Units Subcutaneous Daily   levETIRAcetam  1,000 mg Oral BID   lipase/protease/amylase  24,000 Units Oral TID WC   potassium chloride  40 mEq Oral Once   Ensure Max Protein  11 oz Oral BID   valACYclovir  500 mg Oral BID   Continuous Infusions:  promethazine (PHENERGAN) injection (IM or IVPB)     PRN Meds:.acetaminophen **OR** acetaminophen, albuterol, clonazePAM, HYDROmorphone (DILAUDID) injection, HYDROmorphone, hydrOXYzine, lipase/protease/amylase, metoprolol tartrate, ondansetron **OR** ondansetron (ZOFRAN) IV, polyethylene glycol, promethazine (PHENERGAN) injection (IM or IVPB), senna    Subjective:   Carolyn Hooper was seen and examined today.   Very emotional , teary, no vomiting, intermittent abdominal pain.   Objective:   Vitals:   09/28/22 1332 09/28/22 2054 09/29/22 0340 09/29/22 0400  BP: (!) 122/54 131/61 138/71   Pulse: 67 69 86   Resp: 20 16    Temp: 98.1 F (36.7 C) 99.2 F (37.3 C) 98 F (36.7 C)   TempSrc: Oral Oral Oral   SpO2: 97% 99% 94%   Weight:    100.4 kg  Height:    5\' 8"  (1.727 m)    Intake/Output Summary (Last 24 hours) at 09/29/2022 1226 Last data filed at 09/29/2022 0959 Gross per 24 hour  Intake 450 ml  Output 550 ml  Net -100 ml     Filed Weights   09/27/22 0300 09/28/22 0538 09/29/22 0400  Weight: 102.6 kg 100.4 kg 100.4 kg     Exam General exam: Appears calm and comfortable  Respiratory system: Clear to auscultation. Respiratory effort normal. Cardiovascular system: S1 & S2 heard, RRR.  Gastrointestinal system: Abdomen is soft, mildly tender, non distended, bs+ Central nervous system: Alert and oriented. Grossly non focal.  Extremities: Symmetric 5 x 5  power. Skin: No rashes,  Psychiatry: teary today.       Data Reviewed:  I have personally reviewed following labs and imaging studies   CBC Lab Results  Component Value Date   WBC 7.6 09/29/2022   RBC 3.31 (L) 09/29/2022   HGB 10.3 (L) 09/29/2022   HCT 32.8 (L) 09/29/2022   MCV 99.1 09/29/2022   MCH 31.1 09/29/2022   PLT 139 (  L) 09/29/2022   MCHC 31.4 09/29/2022   RDW 17.2 (H) 09/29/2022   LYMPHSABS 1.7 09/29/2022   MONOABS 0.7 09/29/2022   EOSABS 0.1 09/29/2022   BASOSABS 0.0 09/29/2022     Last metabolic panel Lab Results  Component Value Date   NA 136 09/29/2022   K 3.3 (L) 09/29/2022   CL 99 09/29/2022   CO2 27 09/29/2022   BUN 10 09/29/2022   CREATININE 0.55 09/29/2022   GLUCOSE 126 (H) 09/29/2022   GFRNONAA >60 09/29/2022   GFRAA 93 09/21/2019   CALCIUM 8.1 (L) 09/29/2022   PHOS 3.3 02/12/2022   PROT 5.7 (L) 09/27/2022   ALBUMIN 2.7 (L) 09/27/2022   LABGLOB 2.3 07/02/2022   AGRATIO 1.6 07/02/2022   BILITOT 1.4 (H) 09/27/2022   ALKPHOS 61 09/27/2022   AST 18 09/27/2022   ALT 31 09/27/2022   ANIONGAP 10 09/29/2022    CBG (last 3)  Recent Labs    09/28/22 2053 09/29/22 0735 09/29/22 1213  GLUCAP 90 130* 184*      Coagulation Profile: Recent Labs  Lab 09/26/22 0508  INR 1.1     Radiology Studies: No results found.     Kathlen Mody M.D. [facility] 09/29/2022, 12:26 PM  Available via Epic secure chat 7am-7pm After 7 pm, please refer to night coverage provider listed on amion.

## 2022-09-29 NOTE — Progress Notes (Signed)
PT Cancellation Note  Patient Details Name: Carolyn Hooper MRN: [id number] DOB: [birth year]   Cancelled Treatment:    Reason Eval/Treat Not Completed: Patient declined, no reason specified Pt just back from MRI and reports having had sedative medication.  Pt just wanting to sleep at this time.  Pt pleasantly declined but also hopeful to go home later today or tomorrow.  Pt states she has been up in her room and feels safe to d/c home.  Pt has been seen at [facility] neuro 6/13 but has not since returned.  Pt agreeable to see PT tomorrow if she does not d/c however she does not appear to need PT evaluation in order to d/c.   Kati L Payson 09/29/2022, 2:44 PM Paulino Door, DPT Physical Therapist Acute Rehabilitation Services Office: [phone]

## 2022-09-29 NOTE — Progress Notes (Signed)
Alerted yesterday by hospital team that she passed a minimal amount of bright red blood hemoglobin stable over 3 of the last 4 days doubt this was significant and do not think he needs any further workup at this time and please call Dr. Lorenso Quarry next week if any GI question or problem otherwise we will set up repeat CT scan in a few weeks and presumed stent removal to follow

## 2022-09-29 NOTE — Plan of Care (Signed)
  Problem: Education: Goal: Ability to describe self-care measures that may prevent or decrease complications (Diabetes Survival Skills Education) will improve Outcome: Progressing   Problem: Coping: Goal: Ability to adjust to condition or change in health will improve Outcome: Progressing   Problem: Fluid Volume: Goal: Ability to maintain a balanced intake and output will improve Outcome: Progressing   Problem: Metabolic: Goal: Ability to maintain appropriate glucose levels will improve Outcome: Progressing   Problem: Skin Integrity: Goal: Risk for impaired skin integrity will decrease Outcome: Progressing   Problem: Education: Goal: Knowledge of General Education information will improve Description: Including pain rating scale, medication(s)/side effects and non-pharmacologic comfort measures Outcome: Progressing   Problem: Health Behavior/Discharge Planning: Goal: Ability to manage health-related needs will improve Outcome: Progressing   Problem: Clinical Measurements: Goal: Ability to maintain clinical measurements within normal limits will improve Outcome: Progressing Goal: Diagnostic test results will improve Outcome: Progressing   Problem: Activity: Goal: Risk for activity intolerance will decrease Outcome: Progressing   Problem: Nutrition: Goal: Adequate nutrition will be maintained Outcome: Progressing   Problem: Coping: Goal: Level of anxiety will decrease Outcome: Progressing

## 2022-09-30 ENCOUNTER — Other Ambulatory Visit: Payer: Self-pay

## 2022-09-30 ENCOUNTER — Other Ambulatory Visit: Payer: Self-pay | Admitting: Family

## 2022-09-30 ENCOUNTER — Other Ambulatory Visit (HOSPITAL_COMMUNITY): Payer: Self-pay

## 2022-09-30 ENCOUNTER — Telehealth: Payer: Self-pay | Admitting: Internal Medicine

## 2022-09-30 ENCOUNTER — Telehealth: Payer: Self-pay

## 2022-09-30 DIAGNOSIS — J984 Other disorders of lung: Secondary | ICD-10-CM

## 2022-09-30 DIAGNOSIS — R102 Pelvic and perineal pain: Secondary | ICD-10-CM

## 2022-09-30 DIAGNOSIS — B9689 Other specified bacterial agents as the cause of diseases classified elsewhere: Secondary | ICD-10-CM

## 2022-09-30 DIAGNOSIS — R1012 Left upper quadrant pain: Secondary | ICD-10-CM | POA: Diagnosis not present

## 2022-09-30 DIAGNOSIS — C7931 Secondary malignant neoplasm of brain: Secondary | ICD-10-CM | POA: Diagnosis not present

## 2022-09-30 DIAGNOSIS — C3490 Malignant neoplasm of unspecified part of unspecified bronchus or lung: Secondary | ICD-10-CM

## 2022-09-30 DIAGNOSIS — E1169 Type 2 diabetes mellitus with other specified complication: Secondary | ICD-10-CM | POA: Diagnosis not present

## 2022-09-30 DIAGNOSIS — L732 Hidradenitis suppurativa: Secondary | ICD-10-CM

## 2022-09-30 LAB — BASIC METABOLIC PANEL
Anion gap: 10 (ref 5–15)
BUN: 9 mg/dL (ref 8–23)
CO2: 25 mmol/L (ref 22–32)
Calcium: 7.9 mg/dL — ABNORMAL LOW (ref 8.9–10.3)
Chloride: 99 mmol/L (ref 98–111)
Creatinine, Ser: 0.66 mg/dL (ref 0.44–1.00)
GFR, Estimated: >60 mL/min (ref >60)
Glucose, Bld: 166 mg/dL — ABNORMAL HIGH (ref 70–99)
Potassium: 3.5 mmol/L (ref 3.5–5.1)
Sodium: 134 mmol/L — ABNORMAL LOW (ref 135–145)

## 2022-09-30 LAB — GLUCOSE, CAPILLARY
Glucose-Capillary: 143 mg/dL — ABNORMAL HIGH (ref 70–99)
Glucose-Capillary: 202 mg/dL — ABNORMAL HIGH (ref 70–99)

## 2022-09-30 LAB — SURGICAL PATHOLOGY

## 2022-09-30 MED ORDER — LEVOFLOXACIN 500 MG PO TABS
750.0000 mg | ORAL_TABLET | Freq: Every day | ORAL | Status: DC
  Administered 2022-09-30: 750 mg via ORAL
  Filled 2022-09-30: qty 2

## 2022-09-30 MED ORDER — POLYETHYLENE GLYCOL 3350 17 GM/SCOOP PO POWD
17.0000 g | Freq: Every day | ORAL | 0 refills | Status: DC | PRN
  Filled 2022-09-30: qty 238, 14d supply, fill #0

## 2022-09-30 MED ORDER — SODIUM CHLORIDE 0.9 % IV SOLN: INTRAVENOUS | 0 refills | Status: DC

## 2022-09-30 MED ORDER — HYDROXYZINE HCL 10 MG PO TABS
10.0000 mg | ORAL_TABLET | Freq: Three times a day (TID) | ORAL | 0 refills | Status: DC | PRN
  Filled 2022-09-30: qty 30, 10d supply, fill #0

## 2022-09-30 MED ORDER — HEPARIN SOD (PORK) LOCK FLUSH 100 UNIT/ML IV SOLN
500.0000 [IU] | INTRAVENOUS | Status: DC | PRN
  Filled 2022-09-30: qty 5

## 2022-09-30 MED ORDER — LEVOFLOXACIN 750 MG PO TABS
750.0000 mg | ORAL_TABLET | Freq: Every day | ORAL | 0 refills | Status: DC
  Filled 2022-09-30: qty 30, 30d supply, fill #0

## 2022-09-30 NOTE — TOC Initial Note (Signed)
Transition of Care [facility]) - Initial/Assessment Note    Patient Details  Name: Carolyn Hooper MRN: [id number] Date of Birth: 08-24-1960  [facility] CM/SW Contact:    Beckie Busing, RN Phone [phone]  09/30/2022, 3:20 PM  Clinical Narrative:                 [facility] consulted for patient with high risk for readmission. Patient is from home where she lives with a room mate and normally functions independently. Patient reports that she does have a PCP [facility] and she does follow up regularly. Patients pharmacy of choice is [facility] outpatient. Patient states that she does have medical insurance to cover medications and meds are affordable. Patient reports dme (walker/ cane). Patient has no HH. Patient does report that she has transportation via family. Currently patient is in the process of being discharged with home health  to follow for PT/OT and RN for IV abx. CM at bedside offered patient choice for [facility]. Patient does not have a preference as long as insurance will cover. HH referral has been called to [place] with [facility]. Acceptance pending.     Barriers to Discharge: No Barriers Identified   Patient Goals and CMS Choice Patient states their goals for this hospitalization and ongoing recovery are:: Ready to go home CMS Medicare.gov Compare Post Acute Care list provided to:: Patient Choice offered to / list presented to : Patient [place] ownership interest in [facility].provided to::  (n/a)    Expected Discharge Plan and Services In-house Referral: NA Discharge Planning Services: CM Consult Post Acute Care Choice: Home Health Living arrangements for the past 2 months: Single Family Home Expected Discharge Date: 09/30/22               DME Arranged: N/A DME Agency: NA       HH Arranged: RN, PT, OT          Prior Living Arrangements/Services Living arrangements for the past 2 months: Single Family Home Lives with:: Roommate Patient  language and need for interpreter reviewed:: Yes Do you feel safe going back to the place where you live?: Yes      Need for Family Participation in Patient Care: Yes (Comment) Care giver support system in place?: Yes (comment) Current home services: DME (walker/ cane) Criminal Activity/Legal Involvement Pertinent to Current Situation/Hospitalization: No - Comment as needed  Activities of Daily Living Home Assistive Devices/Equipment: None ADL Screening (condition at time of admission) Patient's cognitive ability adequate to safely complete daily activities?: Yes Is the patient deaf or have difficulty hearing?: Yes Does the patient have difficulty seeing, even when wearing glasses/contacts?: No Does the patient have difficulty concentrating, remembering, or making decisions?: No Patient able to express need for assistance with ADLs?: Yes Does the patient have difficulty dressing or bathing?: No Independently performs ADLs?: Yes (appropriate for developmental age) Does the patient have difficulty walking or climbing stairs?: Yes Weakness of Legs: None Weakness of Arms/Hands: None  Permission Sought/Granted Permission sought to share information with : Family Supports    Share Information with NAME: Jamie-Lee Galdamez     Permission granted to share info w Relationship: son  Permission granted to share info w Contact Information: [phone]  Emotional Assessment Appearance:: Appears stated age Attitude/Demeanor/Rapport: Gracious Affect (typically observed): Accepting, Pleasant Orientation: : Oriented to Self, Oriented to  Time, Oriented to Place, Oriented to Situation Alcohol / Substance Use: Not Applicable Psych Involvement: No (comment)  Admission diagnosis:  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] Epigastric pain [R10.13] 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 [K86.3, K85.90] Intractable pain [R52] Patient Active Problem List   Diagnosis Date Noted   Intractable pain  09/22/2022   Abdominal pain 09/21/2022   GERD (gastroesophageal reflux disease) 09/21/2022   Elevated blood pressure reading 09/21/2022   Obesity (BMI 30-39.9) 09/10/2022   Acute pancreatitis 09/04/2022   Pyuria 09/04/2022   Cholecystitis, acute 07/27/2022   Fever, unknown origin 07/27/2022   Lower abdominal pain 07/27/2022   Diarrhea 07/27/2022   Fever 07/26/2022   Cholelithiasis 07/16/2022   Primary insomnia 07/12/2022   Swelling 06/20/2022   Encounter for antineoplastic chemotherapy 06/05/2022   Encounter for antineoplastic immunotherapy 06/05/2022   Port-A-Cath in place 04/24/2022   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) 03/07/2022   Focal seizure (HCC) 03/07/2022   Non-small cell lung cancer (HCC) 02/25/2022   Goals of care, counseling/discussion 02/25/2022   Lung mass 02/13/2022   Subarachnoid hemorrhage (HCC) 02/12/2022   Aphasia 02/12/2022   Brain mass 02/12/2022   Leukocytosis 02/12/2022   Mass of lower lobe of left lung 02/12/2022   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   Refusal of statin medication by patient 07/10/2020   Myalgia due to statin 07/10/2020   Controlled substance agreement signed 05/28/2018   Benzodiazepine dependence (HCC) 05/28/2018   Hyperlipidemia associated with type 2 diabetes mellitus (HCC) 04/11/2016   Morbid obesity (HCC) 04/11/2016   Former smoker 04/11/2016   Hidradenitis suppurativa 04/11/2016   GAD (generalized anxiety disorder) 08/19/2013   Type 2 diabetes mellitus (HCC) 08/19/2013   Hyperinsulinemia 08/19/2013   PCP:  [name], FNP Pharmacy:   [facility] [address] [place] [address] Phone: [phone] Fax: [phone]  [facility] [address] [place] [facility] [address] [place] [address] [place] [address] [place] [address] Phone: [phone] Fax: [phone]     Social Determinants of Health (SDOH) Social History: SDOH Screenings   Food  Insecurity: No Food Insecurity (09/21/2022)  Housing: Low Risk  (09/21/2022)  Transportation Needs: No Transportation Needs (09/21/2022)  Utilities: Not At Risk (09/21/2022)  Depression (PHQ2-9): Medium Risk (07/12/2022)  Tobacco Use: Medium Risk (09/26/2022)   SDOH Interventions:     Readmission Risk Interventions    09/24/2022    3:55 PM 09/07/2022    2:44 PM 07/28/2022   11:16 AM  Readmission Risk Prevention Plan  Transportation Screening Complete Complete Complete  HRI or Home Care Consult   Complete  Social Work Consult for Recovery Care Planning/Counseling   Complete  Palliative Care Screening   Not Applicable  Medication Review Oceanographer) Complete Complete Complete  PCP or Specialist appointment within 3-5 days of discharge Complete Complete   HRI or Home Care Consult Complete Complete   SW Recovery Care/Counseling Consult Complete Complete   Palliative Care Screening Not Applicable Not Applicable   Skilled Nursing Facility Not Applicable Not Applicable

## 2022-09-30 NOTE — Progress Notes (Signed)
SLP Cancellation Note  Patient Details Name: Carolyn Hooper MRN: [id number] DOB: 08/22/1960   Cancelled treatment:       Reason Eval/Treat Not Completed: Patient declined, no reason specified SLP spoke with patient regarding order for speech and cognitive evaluation. She reported that she had one session of speech therapy "2 and a half months ago" and has been using some strategies to facilitate communication with her word finding and memory impairments. (Per chart review, patient had OP SLP evaluation in February of 2024 and continued treatment was recommended) At this time, she told SLP that she is focused on getting home and is tired. She politely declines SLP evaluation at this time. SLP is recommending OP SLP services if patient desires.  Angela Nevin, MA, CCC-SLP Speech Therapy

## 2022-09-30 NOTE — Evaluation (Signed)
Physical Therapy Evaluation Patient Details Name: Carolyn Hooper MRN: [id number] DOB: August 20, 1960 Today's Date: 09/30/2022  History of Present Illness  Pt is a 62 y/o female presenting to ED On 09/21/2022 with abdominal pain, recently hospitalized 6/19-6/27 with epigastric pain. CT abdomen revealed enhancing fluid collection in stomach. S/p cystogastrostomy on 7/11. MRI of brain on 09/29/2022 reveals stable size of 15 x 13 x 14 mm peripherally enhancing metastasis in the posterior left frontal lobe, stable punctate enhancing metastasis in the medial right frontal lobe. Pt PMH includes but is not limited to:  non-small cell lung CA, brain metastasis, focal seizures, DM and pt currently on immunotherapy/chemotherapy.  Clinical Impression     Pt admitted with above diagnosis.  Pt currently with functional limitations due to the deficits listed below (see PT Problem List). Pt dressed and seated EOB when PT arrived, MD had just exited the room. Pt agreeable to therapy intervention. Pt required S for sit to stand from EOB to RW, pt indicated she felt more stable with the RW. Pt required close S for gait tasks in hallway 500 feet with RW and min cues, LOB x 1 with turns requiring min A to recover, pt indicated she has been limiting gait distance in home setting due to vertigo attributed to brain mets. Pt indicated no pain or dizziness with PT eval. Noted S and S of SOB with prolonged gait and O2 saturation 97% on RA. Pt left seated in recliner, all needs met and on the phone with nursing staff present. Pt is motivated to d/c home. Pt will benefit from acute skilled PT to increase their independence and safety with mobility to allow discharge.         Assistance Recommended at Discharge Intermittent Supervision/Assistance  If plan is discharge home, recommend the following:  Can travel by private vehicle  A little help with walking and/or transfers;A little help with bathing/dressing/bathroom;Assistance with  cooking/housework;Help with stairs or ramp for entrance;Assist for transportation        Equipment Recommendations Rolling walker (2 wheels)  Recommendations for Other Services       Functional Status Assessment Patient has had a recent decline in their functional status and demonstrates the ability to make significant improvements in function in a reasonable and predictable amount of time.     Precautions / Restrictions Precautions Precautions: Fall Restrictions Weight Bearing Restrictions: No      Mobility  Bed Mobility               General bed mobility comments: pt seated EOB when PT arrived, pt indciated she was able to perofrm supine to sit mod I    Transfers Overall transfer level: Needs assistance Equipment used: Rolling walker (2 wheels) Transfers: Sit to/from Stand Sit to Stand: Supervision           General transfer comment: min cues    Ambulation/Gait Ambulation/Gait assistance: Supervision Gait Distance (Feet): 500 Feet Assistive device: Rolling walker (2 wheels) Gait Pattern/deviations: Step-to pattern, Wide base of support, Trunk flexed Gait velocity: decreased     General Gait Details: min cues for safety, posture, proper distance from RW and pt maintaining body inside RW with turns, LOB x 1 with turn requriing min A for recovery  Stairs            Wheelchair Mobility     Tilt Bed    Modified Rankin (Stroke Patients Only)       Balance Overall balance assessment: Needs assistance Sitting-balance support: Feet  supported Sitting balance-Leahy Scale: Good     Standing balance support: During functional activity Standing balance-Leahy Scale: Fair Standing balance comment: static stand no UE support                             Pertinent Vitals/Pain Pain Assessment Pain Assessment: No/denies pain (pt reprots no pain at time of PT intervention however reports her abdominal pain, L LQ was really bad over night)     Home Living Family/patient expects to be discharged to:: Private residence Living Arrangements: Non-relatives/Friends (aide that provides 24/7 assist) Available Help at Discharge: Family;Available PRN/intermittently Type of Home: Mobile home Home Access: Ramped entrance       Home Layout: One level Home Equipment: Cane - single point;Shower Counsellor (2 wheels) Additional Comments: possible plan to d/c to her mother's home with son residing with grandmother and pt will have increased S and A per pt report    Prior Function Prior Level of Function : Independent/Modified Independent;Working/employed;Driving             Mobility Comments: uses cane outside the home ADLs Comments: ind; drives, works     Higher education careers adviser   Dominant Hand: Right    Extremity/Trunk Assessment        Lower Extremity Assessment Lower Extremity Assessment: Overall WFL for tasks assessed    Cervical / Trunk Assessment Cervical / Trunk Assessment: Normal  Communication   Communication: No difficulties (delayed responses)  Cognition Arousal/Alertness: Awake/alert Behavior During Therapy: WFL for tasks assessed/performed Overall Cognitive Status: Within Functional Limits for tasks assessed                         Following Commands: Follows multi-step commands consistently                General Comments      Exercises     Assessment/Plan    PT Assessment Patient needs continued PT services  PT Problem List Decreased activity tolerance;Decreased balance;Decreased mobility;Decreased coordination       PT Treatment Interventions DME instruction;Gait training;Stair training;Functional mobility training;Therapeutic activities;Therapeutic exercise;Balance training;Neuromuscular re-education;Patient/family education    PT Goals (Current goals can be found in the Care Plan section)  Acute Rehab PT Goals Patient Stated Goal: to be able to go home/mothers house  today PT Goal Formulation: With patient Time For Goal Achievement: 10/14/22 Potential to Achieve Goals: Good    Frequency Min 1X/week     Co-evaluation               AM-PAC PT "6 Clicks" Mobility  Outcome Measure Help needed turning from your back to your side while in a flat bed without using bedrails?: None Help needed moving from lying on your back to sitting on the side of a flat bed without using bedrails?: None Help needed moving to and from a bed to a chair (including a wheelchair)?: None Help needed standing up from a chair using your arms (e.g., wheelchair or bedside chair)?: A Little Help needed to walk in hospital room?: A Little Help needed climbing 3-5 steps with a railing? : A Lot 6 Click Score: 20    End of Session Equipment Utilized During Treatment: Gait belt Activity Tolerance: Patient tolerated treatment well Patient left: in chair;with nursing/sitter in room;with call bell/phone within reach Nurse Communication: Mobility status PT Visit Diagnosis: Unsteadiness on feet (R26.81);Muscle weakness (generalized) (M62.81);Difficulty in walking, not elsewhere classified (R26.2)  Time: 6578-4696 PT Time Calculation (min) (ACUTE ONLY): 16 min   Charges:   PT Evaluation $PT Eval Low Complexity: 1 Low   PT General Charges $$ ACUTE PT VISIT: 1 Visit         Johnny Bridge, PT Acute Rehab   Jacqualyn Posey 09/30/2022, 11:42 AM

## 2022-09-30 NOTE — Telephone Encounter (Signed)
 Scheduled per los, patient has been called and voicemail was left. 

## 2022-09-30 NOTE — Telephone Encounter (Signed)
-----   Message from [facility] sent at 09/27/2022  6:03 AM EDT ----- Regarding: RE: Followup MM, ERCP will be needed, at this time as there is no choledocholithiasis in the bile duct. GM. ----- Message ----- From: Vida Rigger, MD Sent: 09/26/2022   5:13 PM EDT To: Loretha Stapler, RN; Lemar Lofty., MD Subject: RE: Followup                                   I can do stent pull if you want. ? Do ercp same time? ----- Message ----- From: Lemar Lofty., MD Sent: 09/26/2022   5:05 PM EDT To: Vida Rigger, MD; Loretha Stapler, RN Subject: Followup                                       Baneza Bartoszek or covering RN, Patient needs CT abdomen pancreas protocol in 2 to 3 weeks. Patient needs EGD with stent pull in 4 to 6 weeks. Thanks. GM

## 2022-09-30 NOTE — Telephone Encounter (Signed)
Attempted to return call- NVM  Need more information - Is pharmacy out of oxycodone ? If so what pharmacy would she like it sent to? OR Are they needing a PA?

## 2022-09-30 NOTE — Progress Notes (Signed)
Per Dr. Thedore Mins okay to use implanted port for prolonged therapy and will cancel PICC order.

## 2022-09-30 NOTE — Progress Notes (Signed)
PHARMACY CONSULT NOTE FOR:  OUTPATIENT  PARENTERAL ANTIBIOTIC THERAPY (OPAT)  Indication: Infected pancreatic pseudocyst   Regimen: Micafungin 100mg  IV q24h and levofloxacin 750mg  PO q24h End date: 10/15/2022  IV antibiotic discharge orders are pended. To discharging provider:  please sign these orders via discharge navigator,  Select New Orders & click on the button choice - Manage This Unsigned Work.     Thank you for allowing pharmacy to be a part of this patient's care.  Mickeal Skinner 09/30/2022, 2:17 PM

## 2022-09-30 NOTE — Discharge Summary (Incomplete)
Physician Discharge Summary   Patient: Carolyn Hooper MRN: [id number] DOB: [birth year]  Admit date:     09/21/2022  Discharge date: {dischdate:26783}  Discharge Physician: Kathlen Mody   PCP: [name], FNP   Recommendations at discharge:  {Tip this will not be part of the note when signed- Example include specific recommendations for outpatient follow-up, pending tests to follow-up on. 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 *  Hospital Course: No notes on file  Assessment and Plan: * Abdominal pain 62 year old with 1 month history of abdominal pain and recent hospitalization for pancreatitis who presented with acutely worsening abdominal pain x 1 day with mildly elevated lipase (improved from previous), unchanged hyodensities in pancreas and slightly increased size in fluid collection abutting the greater curvature of the stomach. Increased body wall edema.  -obs to med-surg -This does not appear to be pancreatitis. Pain is in her lower abdomen down into her mons pubis area. No findings except for body wall edema on CT in this area which could be culprit for the pain from high dose steroid taper  -will do 20mg  IV lasix x1 and see if any relief-strict I/O  -lipase downtrending. Thought possibly due to her immunotherapy on her hospitalization in June and this was stopped. She does have a large gallstone measuring 4.3cm, but no ductal dilatation and no transaminitis. Lipase actually trending downward and eating and drinking fine  -GI consulted by EDP . F/u with recs. Will see tomorrow -check  UA which has reflexed to culture.  -f/u on culture  -wet prep negative for yeast/BV. HSV cx pending but has no vesicular lesions -clear liquid diet  -IV pain medication and home oxy  -anti emetics.  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 If staying longer than 3 days will need to order the next taper down   Elevated blood pressure reading Possibly due to pain and/or steroids  IV metoprolol prn if needed   Non-small cell lung cancer (HCC) Follows with Dr. Arbutus Ped Immunotherapy stopped at recent hospitalization due to possibility of link with pancreatitis.  Will send message that she is getting admitted   Focal seizure (HCC) Continue keppra Seizure precautions   GAD (generalized anxiety disorder) Continue klonopin PRN   GERD (gastroesophageal reflux disease) Continue pepcid BID Protonix daily       {Tip this will not be part of the note when signed Body mass index is 33.65 kg/m. , ,  (Optional):26781}  {(NOTE) Pain control PDMP Statment (Optional):26782} Consultants: *** Procedures performed: ***  Disposition: {Plan; Disposition:26390} Diet recommendation:  Discharge Diet Orders (From admission, onward)     Start     Ordered   09/30/22 0000  Diet - low sodium heart healthy        09/30/22 1418           {  Diet_Plan:26776} DISCHARGE MEDICATION: Allergies as of 09/30/2022       Reactions   Adhesive [tape] Dermatitis   Statins Itching, Other (See Comments)   Myalgias, also        Medication List     STOP taking these medications    predniSONE 20 MG tablet Commonly known as: DELTASONE       TAKE these medications    albuterol 108 (90 Base) MCG/ACT  inhaler Commonly known as: VENTOLIN HFA Inhale 2 puffs into the lungs every 6 (six) hours as needed.   clonazePAM 0.5 MG tablet Commonly known as: KLONOPIN Take 1 tablet (0.5 mg total) by mouth 2 (two) times daily. What changed:  when to take this reasons to take this   Creon 36000 UNITS Cpep capsule Generic drug: lipase/protease/amylase Take 36,000-72,000 Units by mouth as directed. Take 2 Capsules (72,000 units) with meals and Take 1 Capsule (36,000 units) with snacks.   Ensure Max Protein Liqd Take 330 mLs (11 oz total) by mouth 2 (two) times daily.   famotidine 20 MG tablet Commonly known as: PEPCID Take 1 tablet (20 mg total) by mouth 2 (two) times daily.   fluconazole 150 MG tablet Commonly known as: DIFLUCAN Take 1 tablet by mouth every 3 days as needed (yeast infection).   folic acid 1 MG tablet Commonly known as: FOLVITE Take 1 tablet by mouth once daily   furosemide 20 MG tablet Commonly known as: LASIX Take 1 tablet (20 mg total) by mouth daily as needed for fluid or edema.   hydrOXYzine 10 MG tablet Commonly known as: ATARAX Take 1 tablet (10 mg) by mouth 3 times daily as needed for anxiety.   Lantus SoloStar 100 UNIT/ML Solostar Pen Generic drug: insulin glargine Inject 15 Units into the skin daily.   levETIRAcetam 1000 MG tablet Commonly known as: KEPPRA Take 1 tablet (1,000 mg total) by mouth 2 (two) times daily.   levofloxacin 750 MG tablet Commonly known as: LEVAQUIN Take 1 tablet (750 mg total) by mouth daily.   lidocaine 5 % ointment Commonly known as: XYLOCAINE Apply topically as needed. What changed: reasons to take this   lidocaine-prilocaine cream Commonly known as: EMLA Apply to port prior to access   metFORMIN 1000 MG tablet Commonly known as: GLUCOPHAGE Take 1 tablet by mouth 2 times daily.   micafungin in sodium chloride 0.9 % 100 mL Micafungin 100mg  IV q24h.  Last dose 10/15/2022 Labs: Labs weekly while on IV  antibiotics: _x_ CBC with differential __x CMP __x CRP _x_ ESR _x_ Please leave PIC in place until doctor has seen patient or been notified   Fax weekly labs to [phone]   Mylanta Maximum Strength 400-400-40 MG/5ML suspension Generic drug: alum & mag hydroxide-simeth Take 15 mLs by mouth See admin instructions. Take 15 ml's by mouth midday and bedtime   ondansetron 4 MG tablet Commonly known as: ZOFRAN Take 1 tablet (4 mg total) by mouth every 6 (six) hours as needed for nausea. What changed: how much to take   oxyCODONE 5 MG immediate release tablet Commonly known as: Oxy IR/ROXICODONE Take 1 tablet (5 mg total) by mouth every 8 (eight) hours as needed for moderate pain or severe pain.   oxycodone 5 MG capsule Commonly known as: OXY-IR Take 1 capsule (5 mg total) by mouth every 6 (six) hours as needed. Start taking on: October 18, 2022   oxycodone 5 MG capsule Commonly known as: OXY-IR Take 1 capsule (5 mg total) by  mouth every 6 (six) hours as needed. Start taking on: November 14, 2022   pantoprazole 40 MG tablet Commonly known as: PROTONIX Take 1 tablet (40 mg total) by mouth daily.   polyethylene glycol powder 17 GM/SCOOP powder Commonly known as: GLYCOLAX/MIRALAX Take 17 g by mouth daily as needed for mild constipation.   prochlorperazine 10 MG tablet Commonly known as: COMPAZINE Take 1 tablet (10 mg total) by mouth every 6 (six) hours as needed. What changed: reasons to take this   TechLite Pen Needles 32G X 4 MM Misc Generic drug: Insulin Pen Needle Use to inject insulin daily as directed.   triamcinolone ointment 0.5 % Commonly known as: KENALOG Apply topically to affected area twice daily as directed,   TYLENOL 500 MG tablet Generic drug: acetaminophen Take 1,000 mg by mouth every 6 (six) hours as needed for mild pain or headache.   valACYclovir 500 MG tablet Commonly known as: Valtrex Take 1 tablet (500 mg) by mouth 2 times daily.                Discharge Care Instructions  (From admission, onward)           Start     Ordered   09/30/22 0000  Change dressing on IV access line weekly and PRN  (Home infusion instructions - [facility] )        09/30/22 1412            Follow-up Information     [name], FNP. Schedule an appointment as soon as possible for a visit in 1 week(s).   Specialty: Family Medicine Contact information: [address] [place] [address] [phone]                Discharge Exam: Ceasar Mons Weights   09/27/22 0300 09/28/22 0538 09/29/22 0400  Weight: 102.6 kg 100.4 kg 100.4 kg   ***  Condition at discharge: {DC Condition:26389}  The results of significant diagnostics from this hospitalization (including imaging, microbiology, ancillary and laboratory) are listed below for reference.   Imaging Studies: [place] EKG SITE RITE  Result Date: 09/30/2022 If Site Rite image not attached, placement could not be confirmed due to current cardiac rhythm.  MR BRAIN W WO CONTRAST  Result Date: 09/29/2022 CLINICAL DATA:  Metastatic disease evaluation. Non-small cell lung cancer. EXAM: MRI HEAD WITHOUT AND WITH CONTRAST TECHNIQUE: Multiplanar, multiecho pulse sequences of the brain and surrounding structures were obtained without and with intravenous contrast. CONTRAST:  10mL GADAVIST GADOBUTROL 1 MMOL/ML IV SOLN COMPARISON:  CT head without contrast 08/13/2022. MR head without and with contrast 08/07/2022 FINDINGS: Brain: A peripherally enhancing lesion in the posterior left frontal lobe is grossly stable in size measuring 15 x 13 x 14 mm. The surrounding vasogenic edema is slightly decreased from prior exam. A punctate enhancing metastasis in the medial right frontal lobe is stable. No new foci of enhancement are present. No acute infarct or hemorrhage is present. No other significant white matter disease is present. Deep brain nuclei are within normal limits. The ventricles  are of normal size. No significant extraaxial fluid collection is present. Mild white matter changes extend into the brainstem. The cerebellum is within normal limits bilaterally. The internal auditory canals are within normal limits. Midline structures are within normal limits. No other pathologic enhancement is present. Vascular: Flow is present in the major intracranial arteries. Skull and upper cervical spine: The craniocervical junction is normal. Upper cervical spine is within normal limits.  Marrow signal is unremarkable. Sinuses/Orbits: Small mastoid effusions are present. Mild mucosal thickening is present within ethmoid air cells. Paranasal sinuses are otherwise clear. The globes and orbits are within normal limits. IMPRESSION: 1. Stable size of 15 x 13 x 14 mm peripherally enhancing metastasis in the posterior left frontal lobe. The surrounding vasogenic edema is slightly decreased from prior exam. 2. Stable punctate enhancing metastasis in the medial right frontal lobe. 3. No new foci of enhancement to suggest new metastatic disease. 4. Minimal ethmoid sinus disease. Electronically Signed   By: Marin Roberts M.D.   On: 09/29/2022 18:17   [place] CHEST SOFT TISSUE  Result Date: 09/22/2022 CLINICAL DATA:  Mass left abdomen EXAM: LIMITED ULTRASOUND OF ABDOMINAL SOFT TISSUES TECHNIQUE: Ultrasound examination of the abdominal wall soft tissues was performed in the area of clinical concern. COMPARISON:  Abdominal CT 09/21/2022, MRI 09/05/2022 FINDINGS: No superficial abnormality noted. The fluid collection noted along the greater curvature of the stomach is again seen as noted on prior CT measuring 12.0 x 12.6 x 4.6 cm. This appears simple. IMPRESSION: 12.6 cm fluid collection along the greater curvature of the stomach as seen on prior CT and MRI. This is most compatible with acute pancreatic fluid collection/developing pseudo cyst. Electronically Signed   By: Charlett Nose M.D.   On: 09/22/2022 18:02    CT ABDOMEN PELVIS W CONTRAST  Result Date: 09/21/2022 CLINICAL DATA:  Acute abdominal pain EXAM: CT ABDOMEN AND PELVIS WITH CONTRAST TECHNIQUE: Multidetector CT imaging of the abdomen and pelvis was performed using the standard protocol following bolus administration of intravenous contrast. RADIATION DOSE REDUCTION: This exam was performed according to the departmental dose-optimization program which includes automated exposure control, adjustment of the mA and/or kV according to patient size and/or use of iterative reconstruction technique. CONTRAST:  OMNIPAQUE IOHEXOL 300 MG/ML  SOLN COMPARISON:  CT abdomen and pelvis 09/04/2022. MRI abdomen 09/05/2022. FINDINGS: Lower chest: There is a new small right pleural effusion. Left lower lobe mass measures 3.7 x 4.8 cm and appears unchanged. Hepatobiliary: Gallstones are again seen including a large gallstone measuring 4.3 cm. There is no biliary ductal dilatation. No focal liver lesions are seen. Pancreas: Hypodensity in the head of the pancreas measuring 7 mm image 2/34 appears unchanged. Focal hypodensity in the pancreatic tail measuring 15 x 8 mm image 2/22 is unchanged. No acute inflammation or ductal dilatation. Spleen: Normal in size without focal abnormality. Adrenals/Urinary Tract: Adrenal glands are unremarkable. Kidneys are normal, without renal calculi, focal lesion, or hydronephrosis. Bladder is unremarkable. Stomach/Bowel: Enhancing fluid collection abutting the greater curvature of the stomach is again seen and has increased in size now measuring 9.3 by 12.3 by 6.8 cm (previously 6.1 by 8.7 x 5.3 cm). Second smaller fluid collection adjacent to the gastric fundus image 2/17 measures 1.7 x 1.3 by 1.0 cm and appears similar to the prior study. The stomach is otherwise within normal limits. There is no bowel obstruction, pneumatosis or free air. The appendix is not seen. There is sigmoid colon diverticulosis. Vascular/Lymphatic: Aortic  atherosclerosis. No enlarged abdominal or pelvic lymph nodes. Reproductive: Status post hysterectomy. No adnexal masses. Other: There is a small amount of free fluid in the pelvis and right upper quadrant, unchanged. There is mild body wall edema, increased from prior. Musculoskeletal: Degenerative changes affect the spine and hips. Sclerotic density in the left femoral head is unchanged. IMPRESSION: 1. Enhancing fluid collection abutting the greater curvature of the stomach has mildly increased in size. 2.  Stable small amount of free fluid in the abdomen and pelvis. 3. New small right pleural effusion. 4. Stable left lower lobe mass. 5. Stable stable hypodense lesions in the pancreas, unchanged from prior MRI. 6. Increased body wall edema. 7. Cholelithiasis. Aortic Atherosclerosis (ICD10-I70.0). Electronically Signed   By: Darliss Cheney M.D.   On: 09/21/2022 20:20   MR ABDOMEN MRCP W WO CONTAST  Result Date: 09/05/2022 CLINICAL DATA:  Pancreatitis suspected, cholelithiasis EXAM: MRI ABDOMEN WITHOUT AND WITH CONTRAST (INCLUDING MRCP) TECHNIQUE: Multiplanar multisequence MR imaging of the abdomen was performed both before and after the administration of intravenous contrast. Heavily T2-weighted images of the biliary and pancreatic ducts were obtained, and three-dimensional MRCP images were rendered by post processing. CONTRAST:  10mL GADAVIST GADOBUTROL 1 MMOL/ML IV SOLN COMPARISON:  CT abdomen pelvis, right upper quadrant ultrasound, 09/04/2022 CT abdomen pelvis, 07/27/2022 FINDINGS: Lower chest: Medial left lower lobe mass, partially imaged, with associated trace pleural effusion (series 3, image 2). Hepatobiliary: Mild, diffuse hepatic steatosis with focal fatty deposition in the central right lobe of the liver (series 8, image 39). Large gallstone near the gallbladder neck with additional gravel and sludge in the fundus. No gallbladder wall thickening. No biliary ductal dilatation. Suspect at least one tiny  gallstone in the distal common bile duct near the ampulla, measuring no greater than 0.3 cm (series 3, image 26). Pancreas: Mild inflammatory fat stranding about the pancreas. Small areas of hypoenhancing fluid signal in the superior pancreatic head, neck, and proximal body, largest individual focus measuring 0.8 cm in the superior pancreatic head (series 5, image 23, 18, series 15, image 55, 43). Spleen: Normal in size without significant abnormality. Adrenals/Urinary Tract: Adrenal glands are unremarkable. Kidneys are normal, without renal calculi, solid lesion, or hydronephrosis. Stomach/Bowel: Large, loculated, rim enhancing fluid collection overlying the greater curvature of the stomach measuring 13.5 x 11.1 x 6.5 cm, new compared to prior examination dated 07/27/2022. (Series 4, image 7, series 3, image 29). Stomach otherwise within normal limits. No evidence of bowel wall thickening, distention, or inflammatory changes. Vascular/Lymphatic: No significant vascular findings are present. No enlarged abdominal lymph nodes. Other: No abdominal wall hernia or abnormality. Trace perihepatic and perisplenic ascites. Musculoskeletal: No acute or significant osseous findings. IMPRESSION: 1. Mild inflammatory fat stranding about the pancreas. Subcentimeter foci of hypoenhancing fluid signal in the superior pancreatic head, pancreatic neck, and proximal pancreatic body, most consistent with small foci of parenchymal necrosis and developing acute pancreatic fluid collections. 2. Large, loculated, rim enhancing fluid collection overlying the greater curvature of the stomach measuring 13.5 x 11.1 x 6.5 cm, new compared to prior examination dated 07/27/2022 and consistent with an acute pancreatic fluid collection / developing pseudocyst. The presence or absence of infection is not established by imaging. 3. Suspect at least one tiny gallstone in the distal common bile duct near the ampulla, measuring no greater than 0.3 cm.  No biliary ductal dilatation. 4. Cholelithiasis. 5. Medial left lower lobe mass, partially imaged, with associated trace pleural effusion, in keeping with known primary lung malignancy. 6. Hepatic steatosis. These results will be called to the ordering clinician or representative by the Radiologist Assistant, and communication documented in the PACS or [employer]. Electronically Signed   By: Jearld Lesch M.D.   On: 09/05/2022 07:57   MR 3D Recon At Scanner  Result Date: 09/05/2022 CLINICAL DATA:  Pancreatitis suspected, cholelithiasis EXAM: MRI ABDOMEN WITHOUT AND WITH CONTRAST (INCLUDING MRCP) TECHNIQUE: Multiplanar multisequence MR imaging of the abdomen was  performed both before and after the administration of intravenous contrast. Heavily T2-weighted images of the biliary and pancreatic ducts were obtained, and three-dimensional MRCP images were rendered by post processing. CONTRAST:  10mL GADAVIST GADOBUTROL 1 MMOL/ML IV SOLN COMPARISON:  CT abdomen pelvis, right upper quadrant ultrasound, 09/04/2022 CT abdomen pelvis, 07/27/2022 FINDINGS: Lower chest: Medial left lower lobe mass, partially imaged, with associated trace pleural effusion (series 3, image 2). Hepatobiliary: Mild, diffuse hepatic steatosis with focal fatty deposition in the central right lobe of the liver (series 8, image 39). Large gallstone near the gallbladder neck with additional gravel and sludge in the fundus. No gallbladder wall thickening. No biliary ductal dilatation. Suspect at least one tiny gallstone in the distal common bile duct near the ampulla, measuring no greater than 0.3 cm (series 3, image 26). Pancreas: Mild inflammatory fat stranding about the pancreas. Small areas of hypoenhancing fluid signal in the superior pancreatic head, neck, and proximal body, largest individual focus measuring 0.8 cm in the superior pancreatic head (series 5, image 23, 18, series 15, image 55, 43). Spleen: Normal in size without  significant abnormality. Adrenals/Urinary Tract: Adrenal glands are unremarkable. Kidneys are normal, without renal calculi, solid lesion, or hydronephrosis. Stomach/Bowel: Large, loculated, rim enhancing fluid collection overlying the greater curvature of the stomach measuring 13.5 x 11.1 x 6.5 cm, new compared to prior examination dated 07/27/2022. (Series 4, image 7, series 3, image 29). Stomach otherwise within normal limits. No evidence of bowel wall thickening, distention, or inflammatory changes. Vascular/Lymphatic: No significant vascular findings are present. No enlarged abdominal lymph nodes. Other: No abdominal wall hernia or abnormality. Trace perihepatic and perisplenic ascites. Musculoskeletal: No acute or significant osseous findings. IMPRESSION: 1. Mild inflammatory fat stranding about the pancreas. Subcentimeter foci of hypoenhancing fluid signal in the superior pancreatic head, pancreatic neck, and proximal pancreatic body, most consistent with small foci of parenchymal necrosis and developing acute pancreatic fluid collections. 2. Large, loculated, rim enhancing fluid collection overlying the greater curvature of the stomach measuring 13.5 x 11.1 x 6.5 cm, new compared to prior examination dated 07/27/2022 and consistent with an acute pancreatic fluid collection / developing pseudocyst. The presence or absence of infection is not established by imaging. 3. Suspect at least one tiny gallstone in the distal common bile duct near the ampulla, measuring no greater than 0.3 cm. No biliary ductal dilatation. 4. Cholelithiasis. 5. Medial left lower lobe mass, partially imaged, with associated trace pleural effusion, in keeping with known primary lung malignancy. 6. Hepatic steatosis. These results will be called to the ordering clinician or representative by the Radiologist Assistant, and communication documented in the PACS or [employer]. Electronically Signed   By: Jearld Lesch M.D.   On:  09/05/2022 07:57   CT Angio Chest PE W and/or Wo Contrast  Addendum Date: 09/04/2022   ADDENDUM REPORT: 09/04/2022 21:30 ADDENDUM: There is mild stranding of the upper mesentery and adjacent to the pancreas which may represent acute pancreatitis. Correlation with pancreatic enzymes recommended. Additionally there is a loculated appearing fluid collection measuring approximately 6 x 14 cm along the greater curvature of the stomach most consistent with a pseudocyst. Electronically Signed   By: Elgie Collard M.D.   On: 09/04/2022 21:30   Result Date: 09/04/2022 CLINICAL DATA:  Upper abdominal pain and back pain. History of lung cancer. EXAM: CT ANGIOGRAPHY CHEST CT ABDOMEN AND PELVIS WITH CONTRAST TECHNIQUE: Multidetector CT imaging of the chest was performed using the standard protocol during bolus administration of intravenous  contrast. Multiplanar CT image reconstructions and MIPs were obtained to evaluate the vascular anatomy. Multidetector CT imaging of the abdomen and pelvis was performed using the standard protocol during bolus administration of intravenous contrast. RADIATION DOSE REDUCTION: This exam was performed according to the departmental dose-optimization program which includes automated exposure control, adjustment of the mA and/or kV according to patient size and/or use of iterative reconstruction technique. CONTRAST:  OMNIPAQUE IOHEXOL 350 MG/ML SOLN COMPARISON:  CT abdomen pelvis dated 07/27/2022. chest CT dated 07/15/2022. FINDINGS: CTA CHEST FINDINGS Cardiovascular: There is no cardiomegaly or pericardial effusion. Three-vessel coronary vascular calcification. Mild atherosclerotic calcification of the thoracic aorta. No aneurysmal dilatation or dissection. The origins of the great vessels of the aortic arch appear patent. Right-sided Port-A-Cath with tip in the right atrium close to the cavoatrial junction. No pulmonary artery embolus identified. Mediastinum/Nodes: No hilar or  mediastinal adenopathy. The esophagus and the thyroid gland are grossly unremarkable. No mediastinal fluid collection. Lungs/Pleura: Grossly stable left lower lobe mass in keeping with known malignancy. Several scattered pulmonary nodules measure up to 9 mm in the right upper lobe similar to prior CT and most consistent with metastasis. No new consolidation. No pleural effusion or pneumothorax. The central airways are patent. Musculoskeletal: Osteopenia with degenerative changes spine. No acute osseous pathology. Review of the MIP images confirms the above findings. CT ABDOMEN and PELVIS FINDINGS No intra-abdominal free air.  Small ascites. Hepatobiliary: The liver is unremarkable. Focal area of hypoenhancement in the left lobe of the liver centrally, likely fatty infiltration. There is mild biliary dilatation. There is a large rim calcified stone in the gallbladder measuring approximately 5.2 cm in diameter. No pericholecystic fluid or evidence of acute cholecystitis by CT. Pancreas: Subcentimeter hypodense lesion in the uncinate process of the pancreas appears similar to prior CT and may represent focal interspersed fat or a side branch IPMN. No active inflammatory changes. No dilatation of the main pancreatic duct or gland atrophy. Spleen: Normal in size without focal abnormality. Adrenals/Urinary Tract: The adrenal glands are unremarkable. There is no hydronephrosis on either side. There is symmetric enhancement and excretion of contrast by both kidneys. The visualized ureters appear unremarkable. The urinary bladder is collapsed. Stomach/Bowel: There is sigmoid diverticulosis and scattered colonic diverticula without active inflammatory changes. There is no bowel obstruction or active inflammation. The appendix is normal. Vascular/Lymphatic: Advanced aortoiliac atherosclerotic disease. The IVC is unremarkable. No portal venous gas. There is no adenopathy. Reproductive: Hysterectomy.  No adnexal masses. Other:  None Musculoskeletal: Osteopenia with degenerative changes of the spine. No acute osseous pathology. Review of the MIP images confirms the above findings. IMPRESSION: 1. No acute intrathoracic, abdominal, or pelvic pathology. No CT evidence of pulmonary artery embolus. 2. Grossly stable left lower lobe mass in keeping with known malignancy. 3. Stable pulmonary nodules most consistent with metastasis. 4. Cholelithiasis. 5. Colonic diverticulosis. No bowel obstruction. Normal appendix. 6.  Aortic Atherosclerosis (ICD10-I70.0). Electronically Signed: By: Elgie Collard M.D. On: 09/04/2022 19:59   CT ABDOMEN PELVIS W CONTRAST  Addendum Date: 09/04/2022   ADDENDUM REPORT: 09/04/2022 21:30 ADDENDUM: There is mild stranding of the upper mesentery and adjacent to the pancreas which may represent acute pancreatitis. Correlation with pancreatic enzymes recommended. Additionally there is a loculated appearing fluid collection measuring approximately 6 x 14 cm along the greater curvature of the stomach most consistent with a pseudocyst. Electronically Signed   By: Elgie Collard M.D.   On: 09/04/2022 21:30   Result Date: 09/04/2022 CLINICAL DATA:  Upper abdominal pain and back pain. History of lung cancer. EXAM: CT ANGIOGRAPHY CHEST CT ABDOMEN AND PELVIS WITH CONTRAST TECHNIQUE: Multidetector CT imaging of the chest was performed using the standard protocol during bolus administration of intravenous contrast. Multiplanar CT image reconstructions and MIPs were obtained to evaluate the vascular anatomy. Multidetector CT imaging of the abdomen and pelvis was performed using the standard protocol during bolus administration of intravenous contrast. RADIATION DOSE REDUCTION: This exam was performed according to the departmental dose-optimization program which includes automated exposure control, adjustment of the mA and/or kV according to patient size and/or use of iterative reconstruction technique. CONTRAST:   OMNIPAQUE IOHEXOL 350 MG/ML SOLN COMPARISON:  CT abdomen pelvis dated 07/27/2022. chest CT dated 07/15/2022. FINDINGS: CTA CHEST FINDINGS Cardiovascular: There is no cardiomegaly or pericardial effusion. Three-vessel coronary vascular calcification. Mild atherosclerotic calcification of the thoracic aorta. No aneurysmal dilatation or dissection. The origins of the great vessels of the aortic arch appear patent. Right-sided Port-A-Cath with tip in the right atrium close to the cavoatrial junction. No pulmonary artery embolus identified. Mediastinum/Nodes: No hilar or mediastinal adenopathy. The esophagus and the thyroid gland are grossly unremarkable. No mediastinal fluid collection. Lungs/Pleura: Grossly stable left lower lobe mass in keeping with known malignancy. Several scattered pulmonary nodules measure up to 9 mm in the right upper lobe similar to prior CT and most consistent with metastasis. No new consolidation. No pleural effusion or pneumothorax. The central airways are patent. Musculoskeletal: Osteopenia with degenerative changes spine. No acute osseous pathology. Review of the MIP images confirms the above findings. CT ABDOMEN and PELVIS FINDINGS No intra-abdominal free air.  Small ascites. Hepatobiliary: The liver is unremarkable. Focal area of hypoenhancement in the left lobe of the liver centrally, likely fatty infiltration. There is mild biliary dilatation. There is a large rim calcified stone in the gallbladder measuring approximately 5.2 cm in diameter. No pericholecystic fluid or evidence of acute cholecystitis by CT. Pancreas: Subcentimeter hypodense lesion in the uncinate process of the pancreas appears similar to prior CT and may represent focal interspersed fat or a side branch IPMN. No active inflammatory changes. No dilatation of the main pancreatic duct or gland atrophy. Spleen: Normal in size without focal abnormality. Adrenals/Urinary Tract: The adrenal glands are unremarkable. There is  no hydronephrosis on either side. There is symmetric enhancement and excretion of contrast by both kidneys. The visualized ureters appear unremarkable. The urinary bladder is collapsed. Stomach/Bowel: There is sigmoid diverticulosis and scattered colonic diverticula without active inflammatory changes. There is no bowel obstruction or active inflammation. The appendix is normal. Vascular/Lymphatic: Advanced aortoiliac atherosclerotic disease. The IVC is unremarkable. No portal venous gas. There is no adenopathy. Reproductive: Hysterectomy.  No adnexal masses. Other: None Musculoskeletal: Osteopenia with degenerative changes of the spine. No acute osseous pathology. Review of the MIP images confirms the above findings. IMPRESSION: 1. No acute intrathoracic, abdominal, or pelvic pathology. No CT evidence of pulmonary artery embolus. 2. Grossly stable left lower lobe mass in keeping with known malignancy. 3. Stable pulmonary nodules most consistent with metastasis. 4. Cholelithiasis. 5. Colonic diverticulosis. No bowel obstruction. Normal appendix. 6.  Aortic Atherosclerosis (ICD10-I70.0). Electronically Signed: By: Elgie Collard M.D. On: 09/04/2022 19:59   US Abdomen Limited RUQ (LIVER/GB)  Result Date: 09/04/2022 CLINICAL DATA:  Pancreatitis. EXAM: ULTRASOUND ABDOMEN LIMITED RIGHT UPPER QUADRANT COMPARISON:  CT abdomen pelvis dated 09/04/2022. FINDINGS: Gallbladder: There is a large stone in the gallbladder, better seen on the CT. No gallbladder wall thickening or pericholecystic fluid.  Negative sonographic Murphy's sign. Common bile duct: Diameter: 5 mm Liver: There is diffuse increased liver echogenicity most commonly seen in the setting of fatty infiltration. Superimposed inflammation or fibrosis is not excluded. Clinical correlation is recommended. Portal vein is patent on color Doppler imaging with normal direction of blood flow towards the liver. Other: Small ascites. IMPRESSION: 1. Cholelithiasis  without sonographic evidence of acute cholecystitis. 2. Fatty liver. 3. Small ascites. Electronically Signed   By: Elgie Collard M.D.   On: 09/04/2022 21:25    Microbiology: Results for orders placed or performed during the hospital encounter of 09/21/22  Urine Culture     Status: Abnormal   Collection Time: 09/21/22  9:54 PM   Specimen: Urine, Random  Result Value Ref Range Status   Specimen Description   Final    URINE, RANDOM Performed at [facility], [address], [place], [place] [address]    Special Requests   Final    NONE Reflexed from [id number] Performed at [facility], [address], [place], [place] [address]    Culture (A)  Final    40,000 COLONIES/mL ESCHERICHIA COLI 50,000 COLONIES/mL ENTEROCOCCUS FAECALIS    Report Status 09/24/2022 FINAL  Final   Organism ID, Bacteria ESCHERICHIA COLI (A)  Final   Organism ID, Bacteria ENTEROCOCCUS FAECALIS (A)  Final      Susceptibility   Escherichia coli - MIC*    AMPICILLIN >=32 RESISTANT Resistant     CEFAZOLIN <=4 SENSITIVE Sensitive     CEFEPIME <=0.12 SENSITIVE Sensitive     CEFTRIAXONE <=0.25 SENSITIVE Sensitive     CIPROFLOXACIN <=0.25 SENSITIVE Sensitive     GENTAMICIN <=1 SENSITIVE Sensitive     IMIPENEM <=0.25 SENSITIVE Sensitive     NITROFURANTOIN <=16 SENSITIVE Sensitive     TRIMETH/SULFA <=20 SENSITIVE Sensitive     AMPICILLIN/SULBACTAM 16 INTERMEDIATE Intermediate     PIP/TAZO <=4 SENSITIVE Sensitive     * 40,000 COLONIES/mL ESCHERICHIA COLI   Enterococcus faecalis - MIC*    AMPICILLIN <=2 SENSITIVE Sensitive     NITROFURANTOIN <=16 SENSITIVE Sensitive     VANCOMYCIN 2 SENSITIVE Sensitive     * 50,000 COLONIES/mL ENTEROCOCCUS FAECALIS  Body fluid culture w Gram Stain     Status: None   Collection Time: 09/26/22  4:16 PM   Specimen: PATH Cytology Cyst; Body Fluid  Result Value Ref Range Status   Specimen Description   Final    FLUID Performed at [facility], [address] [place], [place] [address]    Special Requests   Final    PANCREATIC PSEUDOCYST ASPIRATION Performed at [facility], [address] [place], [place] [address]    Gram Stain   Final    NO WBC SEEN RARE GRAM POSITIVE COCCI IN PAIRS RARE GRAM POSITIVE COCCI IN CHAINS    Culture   Final    Two isolates with different morphologies were identified as the same organism.The most resistant organism was reported. FEW VIRIDANS STREPTOCOCCUS FEW CANDIDA GLABRATA NO ANAEROBES ISOLATED Performed at [facility], [address] [place], [place] [address]    Report Status 09/30/2022 FINAL  Final   Organism ID, Bacteria VIRIDANS STREPTOCOCCUS  Final      Susceptibility   Viridans streptococcus - MIC*    PENICILLIN 1 INTERMEDIATE Intermediate     CEFTRIAXONE 1 SENSITIVE Sensitive     ERYTHROMYCIN 2 RESISTANT Resistant     LEVOFLOXACIN 1 SENSITIVE Sensitive  VANCOMYCIN 1 SENSITIVE Sensitive     * FEW VIRIDANS STREPTOCOCCUS    Labs: CBC: Recent Labs  Lab 09/27/22 0512 09/28/22 1530 09/29/22 0357  WBC 8.9  --  7.6  NEUTROABS  --   --  5.1  HGB 11.4* 10.2* 10.3*  HCT 35.7* 32.4* 32.8*  MCV 98.1  --  99.1  PLT 143*  --  139*   Basic Metabolic Panel: Recent Labs  Lab 09/25/22 0131 09/27/22 0512 09/29/22 0357 09/30/22 1020  NA 137 138 136 134*  K 4.0 3.9 3.3* 3.5  CL 99 100 99 99  CO2 30 27 27 25   GLUCOSE 142* 169* 126* 166*  BUN 23 12 10 9   CREATININE 0.82 0.74 0.55 0.66  CALCIUM 8.9 8.5* 8.1* 7.9*   Liver Function Tests: Recent Labs  Lab 09/27/22 0512  AST 18  ALT 31  ALKPHOS 61  BILITOT 1.4*  PROT 5.7*  ALBUMIN 2.7*   CBG: Recent Labs  Lab 09/29/22 1213 09/29/22 1719 09/29/22 2122 09/30/22 0752 09/30/22 1206  GLUCAP 184* 119* 152* 143* 202*    Discharge time spent: {LESS THAN/GREATER THAN:26388} 30 minutes.  Signed: Kathlen Mody, MD [facility] 09/30/2022

## 2022-09-30 NOTE — Consult Note (Addendum)
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 Arbutus Ped, recent hospitalization for pancreatitis with necrosis and pseudocyst placed on Creon admitted with abdominal pain found to have infected pseudocyst: #Infected pseudocyst/abscess - Patient presented with abdominal pain times about a month - GI consulted and patient underwent EUS which did not show CBD stones, stent was placed at the time.  Cultures are growing strep viridans and Candida glabrata.  Confirmed with GI, Dr. Meridee Score that cultures from pseudocyst. - Patient is on ceftriaxone and micafungin.  Based on strep sensitivities we can transition ceftriaxone to Levaquin for p.o. option and then continue micafungin  Recommendations:  -D/C ceftriaxone -Sent sensitivities for Candida glabrata.   -Will plan to do Levaquin and micafungin for now. EKG on 09/21/22 with qtc 437  -I gave patient follow-up with infectious disease on 7/30.  Hopefully, by that time Candida sensitivities are back, if sensitive to fluconazole then micafungin can be transitioned to fluconazole.  Plan on 4 weeks antibiotics from 7/11.  Would recommend repeat CT prior to stopping abx which can be ordered at 7/30 visit -ID will sign off  OPAT ORDERS:  Diagnosis: infected pseudocyst  Culture Result: strep viridans and candida glabrata  Allergies  Allergen Reactions   Adhesive  [Tape] Dermatitis   Statins Itching and Other (See Comments)    Myalgias, also     Discharge antibiotics to be given via PICC line:  Per pharmacy protocol micafungin 100mg  IV every day, levaquin 750 mg pO qd   Duration: 2 weeks End Date: 10/15/22  [facility] Care Per Protocol with [employer] Use: Home health RN for IV administration and teaching, line care and labs.    Labs weekly while on IV antibiotics: _x_ CBC with differential __ BMP **TWICE WEEKLY ON VANCOMYCIN  __x CMP __x CRP _x_ ESR __ Vancomycin trough TWICE WEEKLY __ CK  __ Please pull PIC at completion of IV antibiotics _x_ Please leave PIC in place until doctor has seen patient or been notified  Fax weekly labs to [phone]  Clinic Follow Up Appt: 7/30  @ [facility] with Marcos Eke  Microbiology:   Antibiotics: Micafungin and ctx   HPI: Carolyn Hooper is a 62 y.o. female with past medical history of type 2 diabetes, non-small cell lung cancer with mets to the brain, focal seizures on Keppra on immunotherapy/chemotherapy with Dr. Arbutus Ped, anxiety, recent hospitalization pancreatitis necrosis with cyst formation last month on Creon presented with abdominal pain that was worsening x 30 days.  On arrival her vitals were stable.  CT abdomen pelvis on 7/6 showed enhancing fluid collection invading greater curvature of stomach, mildly increased in size free fluid in abdomen pelvis.  Hypodense stable lesions in the pancreas unchanged from prior MRI.  GI was consulted and patient underwent EUS with cyst drainage on 7/11 cultures growing strep viridans and  Candida glabrata.  ID engaged for antibiotic recommendations.   Review of Systems: Review of Systems  All other systems reviewed and are negative.   Past Medical History:  Diagnosis Date   Abscess    hydroadenitis superativa   Anxiety    DM (diabetes mellitus) (HCC)    Hyperinsulinemia    Hypertension 1983   gestational - pre-eclampsia   Lung cancer metastatic to  brain (HCC)    Neuroma    audiotry neuroma   Pancreatitis    Seizure (HCC)    Vertigo     Social History   Tobacco Use   Smoking status: Former    Current packs/day: 0.00    Types: Cigarettes    Quit date: 02/11/2022    Years since quitting: 0.6   Smokeless tobacco: Never  Vaping Use   Vaping status: Never Used  Substance Use Topics   Alcohol use: Yes    Comment: very rare   Drug use: No    Family History  Problem Relation Age of Onset   COPD Mother    Heart disease Father    Diabetes Father    Kidney disease Father    Arthritis Father        RA   Liver disease Father    Scheduled Meds:  ALPRAZolam  1 mg Oral Once   alum & mag hydroxide-simeth  30 mL Oral BID   Chlorhexidine Gluconate Cloth  6 each Topical Daily   famotidine  20 mg Oral BID   folic acid  1 mg Oral Daily   insulin aspart  0-15 Units Subcutaneous TID WC   insulin aspart  2 Units Subcutaneous TID WC   insulin glargine-yfgn  15 Units Subcutaneous Daily   levETIRAcetam  1,000 mg Oral BID   lipase/protease/amylase  24,000 Units Oral TID WC   Ensure Max Protein  11 oz Oral BID   valACYclovir  500 mg Oral BID   Continuous Infusions:  cefTRIAXone (ROCEPHIN)  IV Stopped (09/29/22 2103)   micafungin (MYCAMINE) 100 mg in sodium chloride 0.9 % 100 mL IVPB Stopped (09/29/22 2218)   promethazine (PHENERGAN) injection (IM or IVPB)     PRN Meds:.acetaminophen **OR** acetaminophen, albuterol, clonazePAM, HYDROmorphone, hydrOXYzine, lipase/protease/amylase, metoprolol tartrate, ondansetron **OR** ondansetron (ZOFRAN) IV, polyethylene glycol, promethazine (PHENERGAN) injection (IM or IVPB), senna Allergies  Allergen Reactions   Adhesive [Tape] Dermatitis   Statins Itching and Other (See Comments)    Myalgias, also    OBJECTIVE: Blood pressure (!) 118/43, pulse 75, temperature 98.1 F (36.7 C), temperature source Oral, resp. rate 16, height 5\' 8"  (1.727 m), weight 100.4 kg, SpO2 97%.  Physical  Exam Constitutional:      Appearance: Normal appearance.  HENT:     Head: Normocephalic and atraumatic.     Right Ear: Tympanic membrane normal.     Left Ear: Tympanic membrane normal.     Nose: Nose normal.     Mouth/Throat:     Mouth: Mucous membranes are moist.  Eyes:     Extraocular Movements: Extraocular movements intact.     Conjunctiva/sclera: Conjunctivae normal.     Pupils: Pupils are equal, round, and reactive to light.  Cardiovascular:     Rate and Rhythm: Normal rate and regular rhythm.     Heart sounds: No murmur heard.    No friction rub. No gallop.  Pulmonary:     Effort: Pulmonary effort is normal.     Breath sounds: Normal breath sounds.  Abdominal:  General: Abdomen is flat.     Palpations: Abdomen is soft.  Musculoskeletal:        General: Normal range of motion.  Skin:    General: Skin is warm and dry.  Neurological:     General: No focal deficit present.     Mental Status: She is alert and oriented to person, place, and time.  Psychiatric:        Mood and Affect: Mood normal.     Lab Results Lab Results  Component Value Date   WBC 7.6 09/29/2022   HGB 10.3 (L) 09/29/2022   HCT 32.8 (L) 09/29/2022   MCV 99.1 09/29/2022   PLT 139 (L) 09/29/2022    Lab Results  Component Value Date   CREATININE 0.55 09/29/2022   BUN 10 09/29/2022   NA 136 09/29/2022   K 3.3 (L) 09/29/2022   CL 99 09/29/2022   CO2 27 09/29/2022    Lab Results  Component Value Date   ALT 31 09/27/2022   AST 18 09/27/2022   ALKPHOS 61 09/27/2022   BILITOT 1.4 (H) 09/27/2022       Danelle Earthly, MD [facility] 09/30/2022, 11:07 AM   I have personally spent 85 minutes involved in face-to-face and non-face-to-face activities for this patient on the day of the visit. Professional time spent includes the following activities: Preparing to see the patient (review of tests), Obtaining and/or reviewing separately  obtained history (admission/discharge record), Performing a medically appropriate examination and/or evaluation , Ordering medications/tests/procedures, referring and communicating with other health care professionals, Documenting clinical information in the EMR, Independently interpreting results (not separately reported), Communicating results to the patient/family/caregiver, Counseling and educating the patient/family/caregiver and Care coordination (not separately reported).

## 2022-09-30 NOTE — TOC Transition Note (Addendum)
Transition of Care [facility]) - CM/SW Discharge Note   Patient Details  Name: Carolyn Hooper MRN: [id number] Date of Birth: [birth year]  [facility] CM/SW Contact:  Beckie Busing, RN Phone Number:[phone]  09/30/2022, 3:30 PM   Clinical Narrative:    Patient discharging home with IV abx. Pam with [employer] consulted for IV abx . TOC has been consulted. CM spoke with Jeri Modena who is currently at [facility] and will be on unit later for teaching. Patient is not ready for discharge at this point pam will need to completed home infusion teaching and TOC will need to secure [facility] for RN/ PT/OT.   1615 HH referral acceptance still pending Per [facility]   Final next level of care: Home w Home Health Services Barriers to Discharge: No Barriers Identified   Patient Goals and CMS Choice CMS Medicare.gov Compare Post Acute Care list provided to:: Patient Choice offered to / list presented to : Patient  Discharge Placement                         Discharge Plan and Services Additional resources added to the After Visit Summary for   In-house Referral: NA Discharge Planning Services: CM Consult Post Acute Care Choice: Home Health          DME Arranged: N/A DME Agency: NA       HH Arranged: RN, PT, OT          Social Determinants of Health (SDOH) Interventions SDOH Screenings   Food Insecurity: No Food Insecurity (09/21/2022)  Housing: Low Risk  (09/21/2022)  Transportation Needs: No Transportation Needs (09/21/2022)  Utilities: Not At Risk (09/21/2022)  Depression (PHQ2-9): Medium Risk (07/12/2022)  Tobacco Use: Medium Risk (09/26/2022)     Readmission Risk Interventions    09/24/2022    3:55 PM 09/07/2022    2:44 PM 07/28/2022   11:16 AM  Readmission Risk Prevention Plan  Transportation Screening Complete Complete Complete  HRI or Home Care Consult   Complete  Social Work Consult for Recovery Care Planning/Counseling   Complete  Palliative Care Screening   Not  Applicable  Medication Review Oceanographer) Complete Complete Complete  PCP or Specialist appointment within 3-5 days of discharge Complete Complete   HRI or Home Care Consult Complete Complete   SW Recovery Care/Counseling Consult Complete Complete   Palliative Care Screening Not Applicable Not Applicable   Skilled Nursing Facility Not Applicable Not Applicable

## 2022-09-30 NOTE — Telephone Encounter (Signed)
Her Oxy scripts are printed and [facility] can not accept those, can you resent all scripts please to [facility] in [facility]

## 2022-09-30 NOTE — Telephone Encounter (Signed)
This nurse received multiple calls from this patient this morning requesting to know if her oncology provider can over ride the hospitalist and send her home.  This nurse advised her that it is the decision of the hospital doctor to say she is stable enough to go home.  Patient request to know if there is one doctor that is able to override any doctors decision.  I advised not to this nurses knowledge.  Patient then request the phone number for her primary care physician [facility].  Nurse verified phone number with patient and assured her that the primary care physician will usually defer that decision to the hospital doctor as well. Patient acknowledged understanding and has  No further questions or concerns  at this time.

## 2022-09-30 NOTE — Progress Notes (Signed)
Discharge instructions explained to [name], patient' son. One written prescription given to him to have filled. Several other medications brought to [place] from pharmacy. Next medications  due written on discharge medication page. Asti's family was late picking her up because they had car trouble. Patient discharged via wheelchair to son and mother. Patient will be staying with her mom.

## 2022-09-30 NOTE — Telephone Encounter (Signed)
Patient still has not been able to get oxycodone prescription from the pharmacy. Wants to know what they need to do to get it.

## 2022-09-30 NOTE — Addendum Note (Signed)
Addended by: Julious Payer D on: 09/30/2022 02:57 PM   Modules accepted: Orders

## 2022-10-01 ENCOUNTER — Telehealth: Payer: Self-pay

## 2022-10-01 ENCOUNTER — Ambulatory Visit: Payer: BC Managed Care – PPO | Admitting: Physical Therapy

## 2022-10-01 DIAGNOSIS — K863 Pseudocyst of pancreas: Secondary | ICD-10-CM | POA: Diagnosis not present

## 2022-10-01 LAB — GLUCOSE, CAPILLARY: Glucose-Capillary: 203 mg/dL — ABNORMAL HIGH (ref 70–99)

## 2022-10-01 NOTE — Telephone Encounter (Signed)
From: Vida Rigger, MD Sent: 09/26/2022   5:13 PM EDT To: Loretha Stapler, RN; Lemar Lofty., MD Subject: RE: Followup                                    I can do stent pull if you want. ? Do ercp same time?   Dr Meridee Score please clarify that Dr Ewing Schlein is doing the procedure?

## 2022-10-01 NOTE — Discharge Summary (Signed)
Physician Discharge Summary   Patient: Carolyn Hooper MRN: [id number] DOB: [birth year]  Admit date:     09/21/2022  Discharge date: 09/30/2022  Discharge Physician: Kathlen Mody   PCP: [name], FNP   Recommendations at discharge:  Please follow up with PCP in one week.  Please follow up with GI as recommended.  Please follow up with oncology as scheduled.   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 She was last hospitalized from 6/19-6/27 with epigastric pain. Sas found to have acute pancreatitis with a pseudocyst felt to be either gallstone pancreatitis versus secondary to immunotherapy.  The patient was started on Decadron due to the possibility of immunotherapy mediated pancreatitis.   She returns to the hospital for abdominal pain which is different from her prior episode of acute pancreatitis.  She describes the pain as dull and achy but stabbing at times.     CT abdomen pelvis revealed an enhancing fluid collection abutting the greater curvature of the stomach that was mildly increased in size, stable hypodense lesions in the pancreas unchanged from prior MRI.    Assessment and Plan:  Abdominal pain. ? From symptomatic pancreatic cyst Underwent EUS , showing a cystic lesions in the pancreatic body, underwent stent placement. Gastritis, 2 cm Hiatal hernia.  GI recommends stopping PPI. Repeat Cross sectional imaging with a CT abd / pancreas in 2 weeks Patient will need AXIOS/double-pigtail  cystogastrostomy stent removal in 4 to 6 weeks. Patient was on ciprofloxacin 500 mg BID For 2 weeks, but her pseudocyst aspiration grew candida and viridans streptococcus. ID consulted and she was started on Micafungin and levaquin , ciprofloxacin stopped.  Continue with creon, pain meds.  Symptomatic management with anti emetics.  Advancing diet diet as tolerated. On carb modified diet today.      Type 2 diabetes mellitus (HCC) Recent A1C of 8.4 in June 2024  Continue with lantus.   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  No seizures seen.  Intermittent speech abnormalities  ordered MRI of the brain with and without contrast for further evaluation, . Speech and cog eval. Ordered.  Therapy evaluations ordered.        Non-small cell lung cancer (HCC) Follows with Dr. Arbutus Ped Immunotherapy stopped at recent hospitalization due to possibility of link with pancreatitis.      Focal seizure (HCC) Continue keppra, no seizures . Seizure precautions    GAD (generalized anxiety disorder) Continue klonopin PRN    GERD (gastroesophageal reflux disease) Continue pepcid BID   Poor oral intake, some nausea, no vomiting.  Able to tolerate liquids today.  Held today's semglee.  Continue to monitor.   Speck of bright red blood per rectum once yesterday: No more episodes  after that.  H&H remains stable.  Stool for occult blood ordered and pending.  Recommend outpatient follow upwith GI        Estimated body mass index is 33.65 kg/m as calculated from the following:   Height as of this encounter: 5\' 8"  (1.727  m).   Weight as of this encounter: 100.4 kg.     Consultants: ID GI Procedures performed: eus  Disposition: Home Diet recommendation:  Discharge Diet Orders (From admission, onward)     Start     Ordered   09/30/22 0000  Diet - low sodium heart healthy        09/30/22 1418           Carb modified diet DISCHARGE  MEDICATION: Allergies as of 09/30/2022       Reactions   Adhesive [tape] Dermatitis   Statins Itching, Other (See Comments)   Myalgias, also        Medication List     STOP taking these medications    predniSONE 20 MG tablet Commonly known as: DELTASONE       TAKE these medications    albuterol 108 (90 Base) MCG/ACT inhaler Commonly known as: VENTOLIN HFA Inhale 2 puffs into the lungs every 6 (six) hours as needed.   clonazePAM 0.5 MG tablet Commonly known as: KLONOPIN Take 1 tablet (0.5 mg total) by mouth 2 (two) times daily. What changed:  when to take this reasons to take this   Creon 36000 UNITS Cpep capsule Generic drug: lipase/protease/amylase Take 36,000-72,000 Units by mouth as directed. Take 2 Capsules (72,000 units) with meals and Take 1 Capsule (36,000 units) with snacks.   Ensure Max Protein Liqd Take 330 mLs (11 oz total) by mouth 2 (two) times daily.   famotidine 20 MG tablet Commonly known as: PEPCID Take 1 tablet (20 mg total) by mouth 2 (two) times daily.   fluconazole 150 MG tablet Commonly known as: DIFLUCAN Take 1 tablet by mouth every 3 days as needed (yeast infection).   folic acid 1 MG tablet Commonly known as: FOLVITE Take 1 tablet by mouth once daily   furosemide 20 MG tablet Commonly known as: LASIX Take 1 tablet (20 mg total) by mouth daily as needed for fluid or edema.   hydrOXYzine 10 MG tablet Commonly known as: ATARAX Take 1 tablet (10 mg) by mouth 3 times daily as needed for anxiety.   Lantus SoloStar 100 UNIT/ML Solostar Pen Generic drug: insulin glargine Inject 15 Units into the skin daily.   levETIRAcetam 1000 MG tablet Commonly known as: KEPPRA Take 1 tablet (1,000 mg total) by mouth 2 (two) times daily.   levofloxacin 750 MG tablet Commonly known as: LEVAQUIN Take 1 tablet (750 mg total) by mouth daily.   lidocaine 5 % ointment Commonly known as: XYLOCAINE Apply topically as needed. What changed: reasons  to take this   lidocaine-prilocaine cream Commonly known as: EMLA Apply to port prior to access   metFORMIN 1000 MG tablet Commonly known as: GLUCOPHAGE Take 1 tablet by mouth 2 times daily.   micafungin in sodium chloride 0.9 % 100 mL Micafungin 100mg  IV q24h.  Last dose 10/15/2022 Labs: Labs weekly while on IV antibiotics: _x_ CBC with differential __x CMP __x CRP _x_ ESR _x_ Please leave PIC in place until doctor has seen patient or been notified   Fax weekly labs to [phone]   Mylanta Maximum Strength 400-400-40 MG/5ML suspension Generic drug: alum & mag hydroxide-simeth Take 15 mLs by mouth See admin instructions. Take 15 ml's by mouth midday and bedtime   ondansetron 4 MG tablet Commonly known as: ZOFRAN Take 1 tablet (4 mg total) by mouth every 6 (six) hours as needed for nausea. What changed: how much to take   oxyCODONE 5 MG immediate  release tablet Commonly known as: Oxy IR/ROXICODONE Take 1 tablet (5 mg total) by mouth every 8 (eight) hours as needed for moderate pain or severe pain.   oxycodone 5 MG capsule Commonly known as: OXY-IR Take 1 capsule (5 mg total) by mouth every 6 (six) hours as needed. Start taking on: October 18, 2022   oxycodone 5 MG capsule Commonly known as: OXY-IR Take 1 capsule (5 mg total) by mouth every 6 (six) hours as needed. Start taking on: November 14, 2022   pantoprazole 40 MG tablet Commonly known as: PROTONIX Take 1 tablet (40 mg total) by mouth daily.   polyethylene glycol powder 17 GM/SCOOP powder Commonly known as: GLYCOLAX/MIRALAX Take 17 g by mouth daily as needed for mild constipation.   prochlorperazine 10 MG tablet Commonly known as: COMPAZINE Take 1 tablet (10 mg total) by mouth every 6 (six) hours as needed. What changed: reasons to take this   TechLite Pen Needles 32G X 4 MM Misc Generic drug: Insulin Pen Needle Use to inject insulin daily as directed.   triamcinolone ointment 0.5 % Commonly known as:  KENALOG Apply topically to affected area twice daily as directed,   TYLENOL 500 MG tablet Generic drug: acetaminophen Take 1,000 mg by mouth every 6 (six) hours as needed for mild pain or headache.   valACYclovir 500 MG tablet Commonly known as: Valtrex Take 1 tablet (500 mg) by mouth 2 times daily.               Discharge Care Instructions  (From admission, onward)           Start     Ordered   09/30/22 0000  Change dressing on IV access line weekly and PRN  (Home infusion instructions - [facility] )        09/30/22 1412            Follow-up Information     [name], FNP. Schedule an appointment as soon as possible for a visit in 1 week(s).   Specialty: Family Medicine Contact information: [address] [place] [address] [phone]                Discharge Exam: Ceasar Mons Weights   09/27/22 0300 09/28/22 0538 09/29/22 0400  Weight: 102.6 kg 100.4 kg 100.4 kg   General exam: Appears calm and comfortable  Respiratory system: Clear to auscultation. Respiratory effort normal. Cardiovascular system: S1 & S2 heard, RRR.  Gastrointestinal system: Abdomen is nondistended, soft and nontender.  Central nervous system: Alert and oriented. No focal neurological deficits. Extremities: Symmetric 5 x 5 power. Skin: No rashes, lesions or ulcers Psychiatry: Mood & affect appropriate.    Condition at discharge: fair  The results of significant diagnostics from this hospitalization (including imaging, microbiology, ancillary and laboratory) are listed below for reference.   Imaging Studies: [place] EKG SITE RITE  Result Date: 09/30/2022 If Site Rite image not attached, placement could not be confirmed due to current cardiac rhythm.  MR BRAIN W WO CONTRAST  Result Date: 09/29/2022 CLINICAL DATA:  Metastatic disease evaluation. Non-small cell lung cancer. EXAM: MRI HEAD WITHOUT AND WITH CONTRAST TECHNIQUE: Multiplanar, multiecho pulse  sequences of the brain and surrounding structures were obtained without and with intravenous contrast. CONTRAST:  10mL GADAVIST GADOBUTROL 1 MMOL/ML IV SOLN COMPARISON:  CT head without contrast 08/13/2022. MR head without and with contrast 08/07/2022 FINDINGS: Brain: A peripherally enhancing lesion in the posterior left frontal lobe is grossly stable in  size measuring 15 x 13 x 14 mm. The surrounding vasogenic edema is slightly decreased from prior exam. A punctate enhancing metastasis in the medial right frontal lobe is stable. No new foci of enhancement are present. No acute infarct or hemorrhage is present. No other significant white matter disease is present. Deep brain nuclei are within normal limits. The ventricles are of normal size. No significant extraaxial fluid collection is present. Mild white matter changes extend into the brainstem. The cerebellum is within normal limits bilaterally. The internal auditory canals are within normal limits. Midline structures are within normal limits. No other pathologic enhancement is present. Vascular: Flow is present in the major intracranial arteries. Skull and upper cervical spine: The craniocervical junction is normal. Upper cervical spine is within normal limits. Marrow signal is unremarkable. Sinuses/Orbits: Small mastoid effusions are present. Mild mucosal thickening is present within ethmoid air cells. Paranasal sinuses are otherwise clear. The globes and orbits are within normal limits. IMPRESSION: 1. Stable size of 15 x 13 x 14 mm peripherally enhancing metastasis in the posterior left frontal lobe. The surrounding vasogenic edema is slightly decreased from prior exam. 2. Stable punctate enhancing metastasis in the medial right frontal lobe. 3. No new foci of enhancement to suggest new metastatic disease. 4. Minimal ethmoid sinus disease. Electronically Signed   By: Marin Roberts M.D.   On: 09/29/2022 18:17   [place] CHEST SOFT TISSUE  Result Date:  09/22/2022 CLINICAL DATA:  Mass left abdomen EXAM: LIMITED ULTRASOUND OF ABDOMINAL SOFT TISSUES TECHNIQUE: Ultrasound examination of the abdominal wall soft tissues was performed in the area of clinical concern. COMPARISON:  Abdominal CT 09/21/2022, MRI 09/05/2022 FINDINGS: No superficial abnormality noted. The fluid collection noted along the greater curvature of the stomach is again seen as noted on prior CT measuring 12.0 x 12.6 x 4.6 cm. This appears simple. IMPRESSION: 12.6 cm fluid collection along the greater curvature of the stomach as seen on prior CT and MRI. This is most compatible with acute pancreatic fluid collection/developing pseudo cyst. Electronically Signed   By: Charlett Nose M.D.   On: 09/22/2022 18:02   CT ABDOMEN PELVIS W CONTRAST  Result Date: 09/21/2022 CLINICAL DATA:  Acute abdominal pain EXAM: CT ABDOMEN AND PELVIS WITH CONTRAST TECHNIQUE: Multidetector CT imaging of the abdomen and pelvis was performed using the standard protocol following bolus administration of intravenous contrast. RADIATION DOSE REDUCTION: This exam was performed according to the departmental dose-optimization program which includes automated exposure control, adjustment of the mA and/or kV according to patient size and/or use of iterative reconstruction technique. CONTRAST:  OMNIPAQUE IOHEXOL 300 MG/ML  SOLN COMPARISON:  CT abdomen and pelvis 09/04/2022. MRI abdomen 09/05/2022. FINDINGS: Lower chest: There is a new small right pleural effusion. Left lower lobe mass measures 3.7 x 4.8 cm and appears unchanged. Hepatobiliary: Gallstones are again seen including a large gallstone measuring 4.3 cm. There is no biliary ductal dilatation. No focal liver lesions are seen. Pancreas: Hypodensity in the head of the pancreas measuring 7 mm image 2/34 appears unchanged. Focal hypodensity in the pancreatic tail measuring 15 x 8 mm image 2/22 is unchanged. No acute inflammation or ductal dilatation. Spleen: Normal in size  without focal abnormality. Adrenals/Urinary Tract: Adrenal glands are unremarkable. Kidneys are normal, without renal calculi, focal lesion, or hydronephrosis. Bladder is unremarkable. Stomach/Bowel: Enhancing fluid collection abutting the greater curvature of the stomach is again seen and has increased in size now measuring 9.3 by 12.3 by 6.8 cm (previously 6.1 by  8.7 x 5.3 cm). Second smaller fluid collection adjacent to the gastric fundus image 2/17 measures 1.7 x 1.3 by 1.0 cm and appears similar to the prior study. The stomach is otherwise within normal limits. There is no bowel obstruction, pneumatosis or free air. The appendix is not seen. There is sigmoid colon diverticulosis. Vascular/Lymphatic: Aortic atherosclerosis. No enlarged abdominal or pelvic lymph nodes. Reproductive: Status post hysterectomy. No adnexal masses. Other: There is a small amount of free fluid in the pelvis and right upper quadrant, unchanged. There is mild body wall edema, increased from prior. Musculoskeletal: Degenerative changes affect the spine and hips. Sclerotic density in the left femoral head is unchanged. IMPRESSION: 1. Enhancing fluid collection abutting the greater curvature of the stomach has mildly increased in size. 2. Stable small amount of free fluid in the abdomen and pelvis. 3. New small right pleural effusion. 4. Stable left lower lobe mass. 5. Stable stable hypodense lesions in the pancreas, unchanged from prior MRI. 6. Increased body wall edema. 7. Cholelithiasis. Aortic Atherosclerosis (ICD10-I70.0). Electronically Signed   By: Darliss Cheney M.D.   On: 09/21/2022 20:20   MR ABDOMEN MRCP W WO CONTAST  Result Date: 09/05/2022 CLINICAL DATA:  Pancreatitis suspected, cholelithiasis EXAM: MRI ABDOMEN WITHOUT AND WITH CONTRAST (INCLUDING MRCP) TECHNIQUE: Multiplanar multisequence MR imaging of the abdomen was performed both before and after the administration of intravenous contrast. Heavily T2-weighted images of  the biliary and pancreatic ducts were obtained, and three-dimensional MRCP images were rendered by post processing. CONTRAST:  10mL GADAVIST GADOBUTROL 1 MMOL/ML IV SOLN COMPARISON:  CT abdomen pelvis, right upper quadrant ultrasound, 09/04/2022 CT abdomen pelvis, 07/27/2022 FINDINGS: Lower chest: Medial left lower lobe mass, partially imaged, with associated trace pleural effusion (series 3, image 2). Hepatobiliary: Mild, diffuse hepatic steatosis with focal fatty deposition in the central right lobe of the liver (series 8, image 39). Large gallstone near the gallbladder neck with additional gravel and sludge in the fundus. No gallbladder wall thickening. No biliary ductal dilatation. Suspect at least one tiny gallstone in the distal common bile duct near the ampulla, measuring no greater than 0.3 cm (series 3, image 26). Pancreas: Mild inflammatory fat stranding about the pancreas. Small areas of hypoenhancing fluid signal in the superior pancreatic head, neck, and proximal body, largest individual focus measuring 0.8 cm in the superior pancreatic head (series 5, image 23, 18, series 15, image 55, 43). Spleen: Normal in size without significant abnormality. Adrenals/Urinary Tract: Adrenal glands are unremarkable. Kidneys are normal, without renal calculi, solid lesion, or hydronephrosis. Stomach/Bowel: Large, loculated, rim enhancing fluid collection overlying the greater curvature of the stomach measuring 13.5 x 11.1 x 6.5 cm, new compared to prior examination dated 07/27/2022. (Series 4, image 7, series 3, image 29). Stomach otherwise within normal limits. No evidence of bowel wall thickening, distention, or inflammatory changes. Vascular/Lymphatic: No significant vascular findings are present. No enlarged abdominal lymph nodes. Other: No abdominal wall hernia or abnormality. Trace perihepatic and perisplenic ascites. Musculoskeletal: No acute or significant osseous findings. IMPRESSION: 1. Mild inflammatory fat  stranding about the pancreas. Subcentimeter foci of hypoenhancing fluid signal in the superior pancreatic head, pancreatic neck, and proximal pancreatic body, most consistent with small foci of parenchymal necrosis and developing acute pancreatic fluid collections. 2. Large, loculated, rim enhancing fluid collection overlying the greater curvature of the stomach measuring 13.5 x 11.1 x 6.5 cm, new compared to prior examination dated 07/27/2022 and consistent with an acute pancreatic fluid collection / developing pseudocyst. The presence  or absence of infection is not established by imaging. 3. Suspect at least one tiny gallstone in the distal common bile duct near the ampulla, measuring no greater than 0.3 cm. No biliary ductal dilatation. 4. Cholelithiasis. 5. Medial left lower lobe mass, partially imaged, with associated trace pleural effusion, in keeping with known primary lung malignancy. 6. Hepatic steatosis. These results will be called to the ordering clinician or representative by the Radiologist Assistant, and communication documented in the PACS or [employer]. Electronically Signed   By: Jearld Lesch M.D.   On: 09/05/2022 07:57   MR 3D Recon At Scanner  Result Date: 09/05/2022 CLINICAL DATA:  Pancreatitis suspected, cholelithiasis EXAM: MRI ABDOMEN WITHOUT AND WITH CONTRAST (INCLUDING MRCP) TECHNIQUE: Multiplanar multisequence MR imaging of the abdomen was performed both before and after the administration of intravenous contrast. Heavily T2-weighted images of the biliary and pancreatic ducts were obtained, and three-dimensional MRCP images were rendered by post processing. CONTRAST:  10mL GADAVIST GADOBUTROL 1 MMOL/ML IV SOLN COMPARISON:  CT abdomen pelvis, right upper quadrant ultrasound, 09/04/2022 CT abdomen pelvis, 07/27/2022 FINDINGS: Lower chest: Medial left lower lobe mass, partially imaged, with associated trace pleural effusion (series 3, image 2). Hepatobiliary: Mild, diffuse hepatic  steatosis with focal fatty deposition in the central right lobe of the liver (series 8, image 39). Large gallstone near the gallbladder neck with additional gravel and sludge in the fundus. No gallbladder wall thickening. No biliary ductal dilatation. Suspect at least one tiny gallstone in the distal common bile duct near the ampulla, measuring no greater than 0.3 cm (series 3, image 26). Pancreas: Mild inflammatory fat stranding about the pancreas. Small areas of hypoenhancing fluid signal in the superior pancreatic head, neck, and proximal body, largest individual focus measuring 0.8 cm in the superior pancreatic head (series 5, image 23, 18, series 15, image 55, 43). Spleen: Normal in size without significant abnormality. Adrenals/Urinary Tract: Adrenal glands are unremarkable. Kidneys are normal, without renal calculi, solid lesion, or hydronephrosis. Stomach/Bowel: Large, loculated, rim enhancing fluid collection overlying the greater curvature of the stomach measuring 13.5 x 11.1 x 6.5 cm, new compared to prior examination dated 07/27/2022. (Series 4, image 7, series 3, image 29). Stomach otherwise within normal limits. No evidence of bowel wall thickening, distention, or inflammatory changes. Vascular/Lymphatic: No significant vascular findings are present. No enlarged abdominal lymph nodes. Other: No abdominal wall hernia or abnormality. Trace perihepatic and perisplenic ascites. Musculoskeletal: No acute or significant osseous findings. IMPRESSION: 1. Mild inflammatory fat stranding about the pancreas. Subcentimeter foci of hypoenhancing fluid signal in the superior pancreatic head, pancreatic neck, and proximal pancreatic body, most consistent with small foci of parenchymal necrosis and developing acute pancreatic fluid collections. 2. Large, loculated, rim enhancing fluid collection overlying the greater curvature of the stomach measuring 13.5 x 11.1 x 6.5 cm, new compared to prior examination dated  07/27/2022 and consistent with an acute pancreatic fluid collection / developing pseudocyst. The presence or absence of infection is not established by imaging. 3. Suspect at least one tiny gallstone in the distal common bile duct near the ampulla, measuring no greater than 0.3 cm. No biliary ductal dilatation. 4. Cholelithiasis. 5. Medial left lower lobe mass, partially imaged, with associated trace pleural effusion, in keeping with known primary lung malignancy. 6. Hepatic steatosis. These results will be called to the ordering clinician or representative by the Radiologist Assistant, and communication documented in the PACS or [employer]. Electronically Signed   By: Bonna Gains.D.  On: 09/05/2022 07:57   CT Angio Chest PE W and/or Wo Contrast  Addendum Date: 09/04/2022   ADDENDUM REPORT: 09/04/2022 21:30 ADDENDUM: There is mild stranding of the upper mesentery and adjacent to the pancreas which may represent acute pancreatitis. Correlation with pancreatic enzymes recommended. Additionally there is a loculated appearing fluid collection measuring approximately 6 x 14 cm along the greater curvature of the stomach most consistent with a pseudocyst. Electronically Signed   By: Elgie Collard M.D.   On: 09/04/2022 21:30   Result Date: 09/04/2022 CLINICAL DATA:  Upper abdominal pain and back pain. History of lung cancer. EXAM: CT ANGIOGRAPHY CHEST CT ABDOMEN AND PELVIS WITH CONTRAST TECHNIQUE: Multidetector CT imaging of the chest was performed using the standard protocol during bolus administration of intravenous contrast. Multiplanar CT image reconstructions and MIPs were obtained to evaluate the vascular anatomy. Multidetector CT imaging of the abdomen and pelvis was performed using the standard protocol during bolus administration of intravenous contrast. RADIATION DOSE REDUCTION: This exam was performed according to the departmental dose-optimization program which includes automated exposure  control, adjustment of the mA and/or kV according to patient size and/or use of iterative reconstruction technique. CONTRAST:  OMNIPAQUE IOHEXOL 350 MG/ML SOLN COMPARISON:  CT abdomen pelvis dated 07/27/2022. chest CT dated 07/15/2022. FINDINGS: CTA CHEST FINDINGS Cardiovascular: There is no cardiomegaly or pericardial effusion. Three-vessel coronary vascular calcification. Mild atherosclerotic calcification of the thoracic aorta. No aneurysmal dilatation or dissection. The origins of the great vessels of the aortic arch appear patent. Right-sided Port-A-Cath with tip in the right atrium close to the cavoatrial junction. No pulmonary artery embolus identified. Mediastinum/Nodes: No hilar or mediastinal adenopathy. The esophagus and the thyroid gland are grossly unremarkable. No mediastinal fluid collection. Lungs/Pleura: Grossly stable left lower lobe mass in keeping with known malignancy. Several scattered pulmonary nodules measure up to 9 mm in the right upper lobe similar to prior CT and most consistent with metastasis. No new consolidation. No pleural effusion or pneumothorax. The central airways are patent. Musculoskeletal: Osteopenia with degenerative changes spine. No acute osseous pathology. Review of the MIP images confirms the above findings. CT ABDOMEN and PELVIS FINDINGS No intra-abdominal free air.  Small ascites. Hepatobiliary: The liver is unremarkable. Focal area of hypoenhancement in the left lobe of the liver centrally, likely fatty infiltration. There is mild biliary dilatation. There is a large rim calcified stone in the gallbladder measuring approximately 5.2 cm in diameter. No pericholecystic fluid or evidence of acute cholecystitis by CT. Pancreas: Subcentimeter hypodense lesion in the uncinate process of the pancreas appears similar to prior CT and may represent focal interspersed fat or a side branch IPMN. No active inflammatory changes. No dilatation of the main pancreatic duct or  gland atrophy. Spleen: Normal in size without focal abnormality. Adrenals/Urinary Tract: The adrenal glands are unremarkable. There is no hydronephrosis on either side. There is symmetric enhancement and excretion of contrast by both kidneys. The visualized ureters appear unremarkable. The urinary bladder is collapsed. Stomach/Bowel: There is sigmoid diverticulosis and scattered colonic diverticula without active inflammatory changes. There is no bowel obstruction or active inflammation. The appendix is normal. Vascular/Lymphatic: Advanced aortoiliac atherosclerotic disease. The IVC is unremarkable. No portal venous gas. There is no adenopathy. Reproductive: Hysterectomy.  No adnexal masses. Other: None Musculoskeletal: Osteopenia with degenerative changes of the spine. No acute osseous pathology. Review of the MIP images confirms the above findings. IMPRESSION: 1. No acute intrathoracic, abdominal, or pelvic pathology. No CT evidence of pulmonary artery embolus. 2.  Grossly stable left lower lobe mass in keeping with known malignancy. 3. Stable pulmonary nodules most consistent with metastasis. 4. Cholelithiasis. 5. Colonic diverticulosis. No bowel obstruction. Normal appendix. 6.  Aortic Atherosclerosis (ICD10-I70.0). Electronically Signed: By: Elgie Collard M.D. On: 09/04/2022 19:59   CT ABDOMEN PELVIS W CONTRAST  Addendum Date: 09/04/2022   ADDENDUM REPORT: 09/04/2022 21:30 ADDENDUM: There is mild stranding of the upper mesentery and adjacent to the pancreas which may represent acute pancreatitis. Correlation with pancreatic enzymes recommended. Additionally there is a loculated appearing fluid collection measuring approximately 6 x 14 cm along the greater curvature of the stomach most consistent with a pseudocyst. Electronically Signed   By: Elgie Collard M.D.   On: 09/04/2022 21:30   Result Date: 09/04/2022 CLINICAL DATA:  Upper abdominal pain and back pain. History of lung cancer. EXAM: CT  ANGIOGRAPHY CHEST CT ABDOMEN AND PELVIS WITH CONTRAST TECHNIQUE: Multidetector CT imaging of the chest was performed using the standard protocol during bolus administration of intravenous contrast. Multiplanar CT image reconstructions and MIPs were obtained to evaluate the vascular anatomy. Multidetector CT imaging of the abdomen and pelvis was performed using the standard protocol during bolus administration of intravenous contrast. RADIATION DOSE REDUCTION: This exam was performed according to the departmental dose-optimization program which includes automated exposure control, adjustment of the mA and/or kV according to patient size and/or use of iterative reconstruction technique. CONTRAST:  OMNIPAQUE IOHEXOL 350 MG/ML SOLN COMPARISON:  CT abdomen pelvis dated 07/27/2022. chest CT dated 07/15/2022. FINDINGS: CTA CHEST FINDINGS Cardiovascular: There is no cardiomegaly or pericardial effusion. Three-vessel coronary vascular calcification. Mild atherosclerotic calcification of the thoracic aorta. No aneurysmal dilatation or dissection. The origins of the great vessels of the aortic arch appear patent. Right-sided Port-A-Cath with tip in the right atrium close to the cavoatrial junction. No pulmonary artery embolus identified. Mediastinum/Nodes: No hilar or mediastinal adenopathy. The esophagus and the thyroid gland are grossly unremarkable. No mediastinal fluid collection. Lungs/Pleura: Grossly stable left lower lobe mass in keeping with known malignancy. Several scattered pulmonary nodules measure up to 9 mm in the right upper lobe similar to prior CT and most consistent with metastasis. No new consolidation. No pleural effusion or pneumothorax. The central airways are patent. Musculoskeletal: Osteopenia with degenerative changes spine. No acute osseous pathology. Review of the MIP images confirms the above findings. CT ABDOMEN and PELVIS FINDINGS No intra-abdominal free air.  Small ascites. Hepatobiliary:  The liver is unremarkable. Focal area of hypoenhancement in the left lobe of the liver centrally, likely fatty infiltration. There is mild biliary dilatation. There is a large rim calcified stone in the gallbladder measuring approximately 5.2 cm in diameter. No pericholecystic fluid or evidence of acute cholecystitis by CT. Pancreas: Subcentimeter hypodense lesion in the uncinate process of the pancreas appears similar to prior CT and may represent focal interspersed fat or a side branch IPMN. No active inflammatory changes. No dilatation of the main pancreatic duct or gland atrophy. Spleen: Normal in size without focal abnormality. Adrenals/Urinary Tract: The adrenal glands are unremarkable. There is no hydronephrosis on either side. There is symmetric enhancement and excretion of contrast by both kidneys. The visualized ureters appear unremarkable. The urinary bladder is collapsed. Stomach/Bowel: There is sigmoid diverticulosis and scattered colonic diverticula without active inflammatory changes. There is no bowel obstruction or active inflammation. The appendix is normal. Vascular/Lymphatic: Advanced aortoiliac atherosclerotic disease. The IVC is unremarkable. No portal venous gas. There is no adenopathy. Reproductive: Hysterectomy.  No adnexal masses. Other: None  Musculoskeletal: Osteopenia with degenerative changes of the spine. No acute osseous pathology. Review of the MIP images confirms the above findings. IMPRESSION: 1. No acute intrathoracic, abdominal, or pelvic pathology. No CT evidence of pulmonary artery embolus. 2. Grossly stable left lower lobe mass in keeping with known malignancy. 3. Stable pulmonary nodules most consistent with metastasis. 4. Cholelithiasis. 5. Colonic diverticulosis. No bowel obstruction. Normal appendix. 6.  Aortic Atherosclerosis (ICD10-I70.0). Electronically Signed: By: Elgie Collard M.D. On: 09/04/2022 19:59   US Abdomen Limited RUQ (LIVER/GB)  Result Date:  09/04/2022 CLINICAL DATA:  Pancreatitis. EXAM: ULTRASOUND ABDOMEN LIMITED RIGHT UPPER QUADRANT COMPARISON:  CT abdomen pelvis dated 09/04/2022. FINDINGS: Gallbladder: There is a large stone in the gallbladder, better seen on the CT. No gallbladder wall thickening or pericholecystic fluid. Negative sonographic Murphy's sign. Common bile duct: Diameter: 5 mm Liver: There is diffuse increased liver echogenicity most commonly seen in the setting of fatty infiltration. Superimposed inflammation or fibrosis is not excluded. Clinical correlation is recommended. Portal vein is patent on color Doppler imaging with normal direction of blood flow towards the liver. Other: Small ascites. IMPRESSION: 1. Cholelithiasis without sonographic evidence of acute cholecystitis. 2. Fatty liver. 3. Small ascites. Electronically Signed   By: Elgie Collard M.D.   On: 09/04/2022 21:25    Microbiology: Results for orders placed or performed during the hospital encounter of 09/21/22  Urine Culture     Status: Abnormal   Collection Time: 09/21/22  9:54 PM   Specimen: Urine, Random  Result Value Ref Range Status   Specimen Description   Final    URINE, RANDOM Performed at [facility], [address] [place], [place] [address]    Special Requests   Final    NONE Reflexed from [id number] Performed at [facility], [address] [place], [place] [address]    Culture (A)  Final    40,000 COLONIES/mL ESCHERICHIA COLI 50,000 COLONIES/mL ENTEROCOCCUS FAECALIS    Report Status 09/24/2022 FINAL  Final   Organism ID, Bacteria ESCHERICHIA COLI (A)  Final   Organism ID, Bacteria ENTEROCOCCUS FAECALIS (A)  Final      Susceptibility   Escherichia coli - MIC*    AMPICILLIN >=32 RESISTANT Resistant     CEFAZOLIN <=4 SENSITIVE Sensitive     CEFEPIME <=0.12 SENSITIVE Sensitive     CEFTRIAXONE <=0.25 SENSITIVE Sensitive     CIPROFLOXACIN <=0.25 SENSITIVE Sensitive     GENTAMICIN <=1 SENSITIVE  Sensitive     IMIPENEM <=0.25 SENSITIVE Sensitive     NITROFURANTOIN <=16 SENSITIVE Sensitive     TRIMETH/SULFA <=20 SENSITIVE Sensitive     AMPICILLIN/SULBACTAM 16 INTERMEDIATE Intermediate     PIP/TAZO <=4 SENSITIVE Sensitive     * 40,000 COLONIES/mL ESCHERICHIA COLI   Enterococcus faecalis - MIC*    AMPICILLIN <=2 SENSITIVE Sensitive     NITROFURANTOIN <=16 SENSITIVE Sensitive     VANCOMYCIN 2 SENSITIVE Sensitive     * 50,000 COLONIES/mL ENTEROCOCCUS FAECALIS  Body fluid culture w Gram Stain     Status: None (Preliminary result)   Collection Time: 09/26/22  4:16 PM   Specimen: PATH Cytology Cyst; Body Fluid  Result Value Ref Range Status   Specimen Description   Final    FLUID Performed at [facility], [address], [place], [place] [address]    Special Requests   Final    PANCREATIC PSEUDOCYST ASPIRATION Performed at [facility], [address] [place], [place] [address]  Gram Stain   Final    NO WBC SEEN RARE GRAM POSITIVE COCCI IN PAIRS RARE GRAM POSITIVE COCCI IN CHAINS    Culture   Final    Two isolates with different morphologies were identified as the same organism.The most resistant organism was reported. FEW VIRIDANS STREPTOCOCCUS FEW CANDIDA GLABRATA NO ANAEROBES ISOLATED Sent to [facility] for further susceptibility testing. Performed at [facility], [address] [place], [place] [address]    Report Status PENDING  Incomplete   Organism ID, Bacteria VIRIDANS STREPTOCOCCUS  Final      Susceptibility   Viridans streptococcus - MIC*    PENICILLIN 1 INTERMEDIATE Intermediate     CEFTRIAXONE 1 SENSITIVE Sensitive     ERYTHROMYCIN 2 RESISTANT Resistant     LEVOFLOXACIN 1 SENSITIVE Sensitive     VANCOMYCIN 1 SENSITIVE Sensitive     * FEW VIRIDANS STREPTOCOCCUS    Labs: CBC: Recent Labs  Lab 09/27/22 0512 09/28/22 1530 09/29/22 0357  WBC 8.9  --  7.6  NEUTROABS  --   --  5.1  HGB 11.4* 10.2* 10.3*  HCT 35.7* 32.4*  32.8*  MCV 98.1  --  99.1  PLT 143*  --  139*   Basic Metabolic Panel: Recent Labs  Lab 09/25/22 0131 09/27/22 0512 09/29/22 0357 09/30/22 1020  NA 137 138 136 134*  K 4.0 3.9 3.3* 3.5  CL 99 100 99 99  CO2 30 27 27 25   GLUCOSE 142* 169* 126* 166*  BUN 23 12 10 9   CREATININE 0.82 0.74 0.55 0.66  CALCIUM 8.9 8.5* 8.1* 7.9*   Liver Function Tests: Recent Labs  Lab 09/27/22 0512  AST 18  ALT 31  ALKPHOS 61  BILITOT 1.4*  PROT 5.7*  ALBUMIN 2.7*   CBG: Recent Labs  Lab 09/29/22 1719 09/29/22 2122 09/30/22 0752 09/30/22 1206 09/30/22 1722  GLUCAP 119* 152* 143* 202* 203*    Discharge time spent: 38 MINUTES.   Signed: Kathlen Mody, MD [facility] 10/01/2022

## 2022-10-01 NOTE — Transitions of Care (Post Inpatient/ED Visit) (Signed)
   10/01/2022  Name: PANSY OSTROVSKY MRN: [id number] DOB: 1960/09/27  Today's TOC FU Call Status: Today's TOC FU Call Status:: Unsuccessul Call (1st Attempt) Unsuccessful Call (1st Attempt) Date: 10/01/22  Attempted to reach the patient regarding the most recent Inpatient/ED visit.  Follow Up Plan: Additional outreach attempts will be made to reach the patient to complete the Transitions of Care (Post Inpatient/ED visit) call.   Jodelle Gross, RN, BSN, CCM Care Management Coordinator [place]/[facility] Phone: [phone]/Fax: [phone]

## 2022-10-02 ENCOUNTER — Other Ambulatory Visit (HOSPITAL_COMMUNITY): Payer: Self-pay

## 2022-10-02 ENCOUNTER — Ambulatory Visit: Payer: BC Managed Care – PPO

## 2022-10-02 ENCOUNTER — Encounter: Payer: Self-pay | Admitting: Internal Medicine

## 2022-10-02 ENCOUNTER — Other Ambulatory Visit: Payer: Self-pay

## 2022-10-02 ENCOUNTER — Other Ambulatory Visit: Payer: Self-pay | Admitting: Physician Assistant

## 2022-10-02 ENCOUNTER — Inpatient Hospital Stay: Payer: BC Managed Care – PPO | Admitting: Internal Medicine

## 2022-10-02 ENCOUNTER — Encounter: Payer: Self-pay | Admitting: Gastroenterology

## 2022-10-02 ENCOUNTER — Encounter: Payer: Self-pay | Admitting: Family Medicine

## 2022-10-02 ENCOUNTER — Other Ambulatory Visit: Payer: BC Managed Care – PPO

## 2022-10-02 ENCOUNTER — Telehealth: Payer: Self-pay

## 2022-10-02 DIAGNOSIS — K859 Acute pancreatitis without necrosis or infection, unspecified: Secondary | ICD-10-CM

## 2022-10-02 DIAGNOSIS — K863 Pseudocyst of pancreas: Secondary | ICD-10-CM | POA: Diagnosis not present

## 2022-10-02 DIAGNOSIS — K862 Cyst of pancreas: Secondary | ICD-10-CM

## 2022-10-02 NOTE — Transitions of Care (Post Inpatient/ED Visit) (Signed)
   10/02/2022  Name: Carolyn Hooper MRN: [id number] DOB: Jan 11, 1961  Today's TOC FU Call Status: Today's TOC FU Call Status:: Unsuccessful Call (2nd Attempt) Unsuccessful Call (2nd Attempt) Date: 10/02/22  Attempted to reach the patient regarding the most recent Inpatient/ED visit.  Follow Up Plan: Additional outreach attempts will be made to reach the patient to complete the Transitions of Care (Post Inpatient/ED visit) call.   Jodelle Gross, RN, BSN, CCM Care Management Coordinator [place]/[facility]

## 2022-10-02 NOTE — Telephone Encounter (Signed)
Carolyn Hooper, I am going to do just an EGD with stent pull.  She does not need ERCP.  Put it under my name.  Thanks. GM

## 2022-10-02 NOTE — Telephone Encounter (Signed)
 Line busy will try later

## 2022-10-02 NOTE — Telephone Encounter (Signed)
EGD has been scheduled for 11/07/22 at 9 am at [facility] with GM

## 2022-10-03 ENCOUNTER — Telehealth: Payer: Self-pay

## 2022-10-03 DIAGNOSIS — K863 Pseudocyst of pancreas: Secondary | ICD-10-CM | POA: Diagnosis not present

## 2022-10-03 LAB — MISC LABCORP TEST (SEND OUT)
LabCorp test name: 9
Labcorp test code: 183119

## 2022-10-03 NOTE — Transitions of Care (Post Inpatient/ED Visit) (Signed)
   10/03/2022  Name: Carolyn Hooper MRN: [id number] DOB: [birth year]  Today's TOC FU Call Status: Today's TOC FU Call Status:: Unsuccessful Call (3rd Attempt) Unsuccessful Call (3rd Attempt) Date: 10/03/22  Attempted to reach the patient regarding the most recent Inpatient/ED visit.  Follow Up Plan: No further outreach attempts will be made at this time. We have been unable to contact the patient.  Jodelle Gross, RN, BSN, CCM Care Management Coordinator [place]/[facility]

## 2022-10-03 NOTE — Telephone Encounter (Signed)
Left message on machine to call back  

## 2022-10-04 DIAGNOSIS — K863 Pseudocyst of pancreas: Secondary | ICD-10-CM | POA: Diagnosis not present

## 2022-10-04 LAB — BODY FLUID CULTURE W GRAM STAIN: Gram Stain: NONE SEEN

## 2022-10-04 NOTE — Telephone Encounter (Signed)
Left message on machine to call back  

## 2022-10-05 ENCOUNTER — Inpatient Hospital Stay (HOSPITAL_COMMUNITY)
Admission: EM | Admit: 2022-10-05 | Discharge: 2022-10-08 | DRG: 439 | Disposition: A | Discharge status: Home / Self Care | Payer: BC Managed Care – PPO | Attending: Family Medicine | Admitting: Family Medicine

## 2022-10-05 ENCOUNTER — Other Ambulatory Visit: Payer: Self-pay

## 2022-10-05 ENCOUNTER — Encounter (HOSPITAL_COMMUNITY): Payer: Self-pay | Admitting: Family Medicine

## 2022-10-05 ENCOUNTER — Emergency Department (HOSPITAL_COMMUNITY): Payer: BC Managed Care – PPO

## 2022-10-05 DIAGNOSIS — K8020 Calculus of gallbladder without cholecystitis without obstruction: Secondary | ICD-10-CM | POA: Diagnosis not present

## 2022-10-05 DIAGNOSIS — Z87891 Personal history of nicotine dependence: Secondary | ICD-10-CM

## 2022-10-05 DIAGNOSIS — C7931 Secondary malignant neoplasm of brain: Secondary | ICD-10-CM | POA: Diagnosis not present

## 2022-10-05 DIAGNOSIS — Z888 Allergy status to other drugs, medicaments and biological substances status: Secondary | ICD-10-CM | POA: Diagnosis not present

## 2022-10-05 DIAGNOSIS — E11649 Type 2 diabetes mellitus with hypoglycemia without coma: Secondary | ICD-10-CM | POA: Diagnosis not present

## 2022-10-05 DIAGNOSIS — R1084 Generalized abdominal pain: Secondary | ICD-10-CM | POA: Diagnosis not present

## 2022-10-05 DIAGNOSIS — Z79899 Other long term (current) drug therapy: Secondary | ICD-10-CM

## 2022-10-05 DIAGNOSIS — Z7984 Long term (current) use of oral hypoglycemic drugs: Secondary | ICD-10-CM

## 2022-10-05 DIAGNOSIS — R110 Nausea: Secondary | ICD-10-CM | POA: Diagnosis not present

## 2022-10-05 DIAGNOSIS — R9431 Abnormal electrocardiogram [ECG] [EKG]: Secondary | ICD-10-CM | POA: Diagnosis not present

## 2022-10-05 DIAGNOSIS — K859 Acute pancreatitis without necrosis or infection, unspecified: Secondary | ICD-10-CM | POA: Diagnosis not present

## 2022-10-05 DIAGNOSIS — F411 Generalized anxiety disorder: Secondary | ICD-10-CM | POA: Diagnosis present

## 2022-10-05 DIAGNOSIS — Z825 Family history of asthma and other chronic lower respiratory diseases: Secondary | ICD-10-CM

## 2022-10-05 DIAGNOSIS — Z6832 Body mass index (BMI) 32.0-32.9, adult: Secondary | ICD-10-CM | POA: Diagnosis not present

## 2022-10-05 DIAGNOSIS — E1165 Type 2 diabetes mellitus with hyperglycemia: Secondary | ICD-10-CM | POA: Diagnosis not present

## 2022-10-05 DIAGNOSIS — Z90710 Acquired absence of both cervix and uterus: Secondary | ICD-10-CM

## 2022-10-05 DIAGNOSIS — R52 Pain, unspecified: Secondary | ICD-10-CM

## 2022-10-05 DIAGNOSIS — K862 Cyst of pancreas: Secondary | ICD-10-CM | POA: Diagnosis not present

## 2022-10-05 DIAGNOSIS — K838 Other specified diseases of biliary tract: Secondary | ICD-10-CM | POA: Diagnosis not present

## 2022-10-05 DIAGNOSIS — E785 Hyperlipidemia, unspecified: Secondary | ICD-10-CM | POA: Diagnosis not present

## 2022-10-05 DIAGNOSIS — Z794 Long term (current) use of insulin: Secondary | ICD-10-CM

## 2022-10-05 DIAGNOSIS — K219 Gastro-esophageal reflux disease without esophagitis: Secondary | ICD-10-CM | POA: Diagnosis not present

## 2022-10-05 DIAGNOSIS — Z833 Family history of diabetes mellitus: Secondary | ICD-10-CM

## 2022-10-05 DIAGNOSIS — G40909 Epilepsy, unspecified, not intractable, without status epilepticus: Secondary | ICD-10-CM | POA: Diagnosis present

## 2022-10-05 DIAGNOSIS — G9389 Other specified disorders of brain: Secondary | ICD-10-CM

## 2022-10-05 DIAGNOSIS — E876 Hypokalemia: Secondary | ICD-10-CM | POA: Diagnosis present

## 2022-10-05 DIAGNOSIS — K5900 Constipation, unspecified: Secondary | ICD-10-CM | POA: Diagnosis present

## 2022-10-05 DIAGNOSIS — E1169 Type 2 diabetes mellitus with other specified complication: Secondary | ICD-10-CM | POA: Diagnosis not present

## 2022-10-05 DIAGNOSIS — R457 State of emotional shock and stress, unspecified: Secondary | ICD-10-CM | POA: Diagnosis not present

## 2022-10-05 DIAGNOSIS — Z8261 Family history of arthritis: Secondary | ICD-10-CM

## 2022-10-05 DIAGNOSIS — E669 Obesity, unspecified: Secondary | ICD-10-CM | POA: Diagnosis present

## 2022-10-05 DIAGNOSIS — C3432 Malignant neoplasm of lower lobe, left bronchus or lung: Secondary | ICD-10-CM | POA: Diagnosis not present

## 2022-10-05 DIAGNOSIS — Z8249 Family history of ischemic heart disease and other diseases of the circulatory system: Secondary | ICD-10-CM | POA: Diagnosis not present

## 2022-10-05 DIAGNOSIS — K863 Pseudocyst of pancreas: Secondary | ICD-10-CM | POA: Diagnosis not present

## 2022-10-05 DIAGNOSIS — Z841 Family history of disorders of kidney and ureter: Secondary | ICD-10-CM

## 2022-10-05 DIAGNOSIS — I10 Essential (primary) hypertension: Secondary | ICD-10-CM | POA: Diagnosis not present

## 2022-10-05 DIAGNOSIS — Z85118 Personal history of other malignant neoplasm of bronchus and lung: Secondary | ICD-10-CM

## 2022-10-05 LAB — CBC
HCT: 29.4 % — ABNORMAL LOW (ref 36.0–46.0)
HCT: 29 % — ABNORMAL LOW (ref 36.0–46.0)
Hemoglobin: 9.3 g/dL — ABNORMAL LOW (ref 12.0–15.0)
Hemoglobin: 9.1 g/dL — ABNORMAL LOW (ref 12.0–15.0)
MCH: 30.7 pg (ref 26.0–34.0)
MCH: 30.3 pg (ref 26.0–34.0)
MCHC: 31.6 g/dL (ref 30.0–36.0)
MCHC: 31.4 g/dL (ref 30.0–36.0)
MCV: 97 fL (ref 80.0–100.0)
MCV: 96.7 fL (ref 80.0–100.0)
Platelets: 187 10*3/uL (ref 150–400)
Platelets: 192 10*3/uL (ref 150–400)
RBC: 3.03 MIL/uL — ABNORMAL LOW (ref 3.87–5.11)
RBC: 3 MIL/uL — ABNORMAL LOW (ref 3.87–5.11)
RDW: 16.5 % — ABNORMAL HIGH (ref 11.5–15.5)
RDW: 16.3 % — ABNORMAL HIGH (ref 11.5–15.5)
WBC: 4.2 10*3/uL (ref 4.0–10.5)
WBC: 3.8 10*3/uL — ABNORMAL LOW (ref 4.0–10.5)
nRBC: 0 % (ref 0.0–0.2)
nRBC: 0 % (ref 0.0–0.2)

## 2022-10-05 LAB — URINALYSIS, ROUTINE W REFLEX MICROSCOPIC
Bilirubin Urine: NEGATIVE
Glucose, UA: NEGATIVE mg/dL
Hgb urine dipstick: NEGATIVE
Ketones, ur: NEGATIVE mg/dL
Leukocytes,Ua: NEGATIVE
Nitrite: NEGATIVE
Protein, ur: NEGATIVE mg/dL
Specific Gravity, Urine: 1.012 (ref 1.005–1.030)
pH: 5 (ref 5.0–8.0)

## 2022-10-05 LAB — LIPASE, BLOOD: Lipase: 215 U/L — ABNORMAL HIGH (ref 11–51)

## 2022-10-05 LAB — COMPREHENSIVE METABOLIC PANEL
ALT: 18 U/L (ref 0–44)
AST: 15 U/L (ref 15–41)
Albumin: 2.3 g/dL — ABNORMAL LOW (ref 3.5–5.0)
Alkaline Phosphatase: 65 U/L (ref 38–126)
Anion gap: 11 (ref 5–15)
BUN: 12 mg/dL (ref 8–23)
CO2: 26 mmol/L (ref 22–32)
Calcium: 8.2 mg/dL — ABNORMAL LOW (ref 8.9–10.3)
Chloride: 95 mmol/L — ABNORMAL LOW (ref 98–111)
Creatinine, Ser: 0.74 mg/dL (ref 0.44–1.00)
GFR, Estimated: >60 mL/min (ref >60)
Glucose, Bld: 204 mg/dL — ABNORMAL HIGH (ref 70–99)
Potassium: 3.3 mmol/L — ABNORMAL LOW (ref 3.5–5.1)
Sodium: 132 mmol/L — ABNORMAL LOW (ref 135–145)
Total Bilirubin: 0.9 mg/dL (ref 0.3–1.2)
Total Protein: 5.6 g/dL — ABNORMAL LOW (ref 6.5–8.1)

## 2022-10-05 LAB — GLUCOSE, CAPILLARY
Glucose-Capillary: 175 mg/dL — ABNORMAL HIGH (ref 70–99)
Glucose-Capillary: 137 mg/dL — ABNORMAL HIGH (ref 70–99)

## 2022-10-05 LAB — CREATININE, SERUM
Creatinine, Ser: 0.64 mg/dL (ref 0.44–1.00)
GFR, Estimated: >60 mL/min (ref >60)

## 2022-10-05 LAB — MAGNESIUM: Magnesium: 1.3 mg/dL — ABNORMAL LOW (ref 1.7–2.4)

## 2022-10-05 MED ORDER — ALBUTEROL SULFATE HFA 108 (90 BASE) MCG/ACT IN AERS: 2.0000 | INHALATION_SPRAY | Freq: Four times a day (QID) | RESPIRATORY_TRACT | Status: DC | PRN

## 2022-10-05 MED ORDER — HYDROMORPHONE HCL 1 MG/ML IJ SOLN
1.0000 mg | Freq: Once | INTRAMUSCULAR | Status: AC
  Administered 2022-10-05: 1 mg via INTRAVENOUS
  Filled 2022-10-05: qty 1

## 2022-10-05 MED ORDER — ONDANSETRON HCL 4 MG/2ML IJ SOLN: 4.0000 mg | Freq: Four times a day (QID) | INTRAMUSCULAR | Status: DC | PRN

## 2022-10-05 MED ORDER — HYDROXYZINE HCL 10 MG PO TABS
10.0000 mg | ORAL_TABLET | Freq: Once | ORAL | Status: AC | PRN
  Administered 2022-10-06: 10 mg via ORAL
  Filled 2022-10-05: qty 1

## 2022-10-05 MED ORDER — ACETAMINOPHEN 650 MG RE SUPP: 650.0000 mg | Freq: Four times a day (QID) | RECTAL | Status: DC | PRN

## 2022-10-05 MED ORDER — FOLIC ACID 1 MG PO TABS
1.0000 mg | ORAL_TABLET | Freq: Every day | ORAL | Status: DC
  Administered 2022-10-05 – 2022-10-08 (×4): 1 mg via ORAL
  Filled 2022-10-05 (×4): qty 1

## 2022-10-05 MED ORDER — PANCRELIPASE (LIP-PROT-AMYL) 36000-114000 UNITS PO CPEP: 36000.0000 [IU] | ORAL_CAPSULE | ORAL | Status: DC

## 2022-10-05 MED ORDER — LEVOFLOXACIN 750 MG PO TABS
750.0000 mg | ORAL_TABLET | Freq: Every day | ORAL | Status: DC
  Administered 2022-10-05 – 2022-10-07 (×3): 750 mg via ORAL
  Filled 2022-10-05 (×3): qty 1

## 2022-10-05 MED ORDER — VALACYCLOVIR HCL 500 MG PO TABS
500.0000 mg | ORAL_TABLET | Freq: Two times a day (BID) | ORAL | Status: DC
  Administered 2022-10-05 – 2022-10-08 (×6): 500 mg via ORAL
  Filled 2022-10-05 (×7): qty 1

## 2022-10-05 MED ORDER — CLONAZEPAM 0.5 MG PO TABS
0.5000 mg | ORAL_TABLET | Freq: Two times a day (BID) | ORAL | Status: DC
  Administered 2022-10-05 – 2022-10-08 (×6): 0.5 mg via ORAL
  Filled 2022-10-05 (×6): qty 1

## 2022-10-05 MED ORDER — CHLORHEXIDINE GLUCONATE CLOTH 2 % EX PADS
6.0000 | MEDICATED_PAD | Freq: Every day | CUTANEOUS | Status: DC
  Administered 2022-10-05 – 2022-10-08 (×4): 6 via TOPICAL

## 2022-10-05 MED ORDER — LEVETIRACETAM 500 MG PO TABS
1000.0000 mg | ORAL_TABLET | Freq: Two times a day (BID) | ORAL | Status: DC
  Administered 2022-10-05 – 2022-10-08 (×6): 1000 mg via ORAL
  Filled 2022-10-05 (×6): qty 2

## 2022-10-05 MED ORDER — INSULIN ASPART 100 UNIT/ML IJ SOLN
0.0000 [IU] | INTRAMUSCULAR | Status: DC
  Administered 2022-10-05: 3 [IU] via SUBCUTANEOUS
  Administered 2022-10-06 (×2): 2 [IU] via SUBCUTANEOUS
  Administered 2022-10-06 (×2): 3 [IU] via SUBCUTANEOUS
  Administered 2022-10-06 – 2022-10-08 (×5): 2 [IU] via SUBCUTANEOUS
  Filled 2022-10-05: qty 0.15

## 2022-10-05 MED ORDER — LEVETIRACETAM 500 MG PO TABS: 1000.0000 mg | ORAL_TABLET | Freq: Two times a day (BID) | ORAL | Status: DC

## 2022-10-05 MED ORDER — SODIUM CHLORIDE 0.9 % IV SOLN
100.0000 mg | INTRAVENOUS | Status: DC
  Administered 2022-10-05 – 2022-10-07 (×3): 100 mg via INTRAVENOUS
  Filled 2022-10-05 (×4): qty 5

## 2022-10-05 MED ORDER — ALBUTEROL SULFATE (2.5 MG/3ML) 0.083% IN NEBU: 2.5000 mg | INHALATION_SOLUTION | Freq: Four times a day (QID) | RESPIRATORY_TRACT | Status: DC | PRN

## 2022-10-05 MED ORDER — PANCRELIPASE (LIP-PROT-AMYL) 12000-38000 UNITS PO CPEP: 36000.0000 [IU] | ORAL_CAPSULE | ORAL | Status: DC | PRN

## 2022-10-05 MED ORDER — PANCRELIPASE (LIP-PROT-AMYL) 12000-38000 UNITS PO CPEP
72000.0000 [IU] | ORAL_CAPSULE | Freq: Three times a day (TID) | ORAL | Status: DC
  Administered 2022-10-06 – 2022-10-08 (×6): 72000 [IU] via ORAL
  Filled 2022-10-05 (×7): qty 6

## 2022-10-05 MED ORDER — SODIUM CHLORIDE 0.9 % IV BOLUS
500.0000 mL | Freq: Once | INTRAVENOUS | Status: AC
  Administered 2022-10-05: 500 mL via INTRAVENOUS

## 2022-10-05 MED ORDER — ENOXAPARIN SODIUM 40 MG/0.4ML IJ SOSY
40.0000 mg | PREFILLED_SYRINGE | INTRAMUSCULAR | Status: DC
  Administered 2022-10-06 – 2022-10-07 (×2): 40 mg via SUBCUTANEOUS
  Filled 2022-10-05 (×2): qty 0.4

## 2022-10-05 MED ORDER — POTASSIUM CHLORIDE 10 MEQ/100ML IV SOLN
10.0000 meq | INTRAVENOUS | Status: AC
  Administered 2022-10-05 (×4): 10 meq via INTRAVENOUS
  Filled 2022-10-05 (×4): qty 100

## 2022-10-05 MED ORDER — ONDANSETRON HCL 4 MG PO TABS: 4.0000 mg | ORAL_TABLET | Freq: Four times a day (QID) | ORAL | Status: DC | PRN

## 2022-10-05 MED ORDER — ACETAMINOPHEN 325 MG PO TABS: 650.0000 mg | ORAL_TABLET | Freq: Four times a day (QID) | ORAL | Status: DC | PRN

## 2022-10-05 MED ORDER — IOHEXOL 300 MG/ML  SOLN
100.0000 mL | Freq: Once | INTRAMUSCULAR | Status: AC | PRN
  Administered 2022-10-05: 100 mL via INTRAVENOUS

## 2022-10-05 MED ORDER — ONDANSETRON HCL 4 MG/2ML IJ SOLN
4.0000 mg | Freq: Once | INTRAMUSCULAR | Status: AC
  Administered 2022-10-05: 4 mg via INTRAVENOUS
  Filled 2022-10-05: qty 2

## 2022-10-05 MED ORDER — OXYCODONE HCL 5 MG PO TABS
5.0000 mg | ORAL_TABLET | Freq: Three times a day (TID) | ORAL | Status: DC | PRN
  Administered 2022-10-05 – 2022-10-07 (×5): 5 mg via ORAL
  Filled 2022-10-05 (×5): qty 1

## 2022-10-05 MED ORDER — POTASSIUM CHLORIDE IN NACL 20-0.9 MEQ/L-% IV SOLN
INTRAVENOUS | Status: DC
  Filled 2022-10-05 (×7): qty 1000

## 2022-10-05 NOTE — ED Notes (Signed)
We reposition patient and put her on her sideso she could be more comfortable.

## 2022-10-05 NOTE — ED Provider Notes (Signed)
[place] [facility]PARTMENT AT [facility] Provider Note   CSN: [id number] Arrival date & time: 10/05/22  4403     History  Chief Complaint  Patient presents with   Nausea    Carolyn Hooper is a 62 y.o. 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 Reports being constipated since being discharged from the hospital 5 days ago. Also complaining of weakness, insomnia, decreased appetite. Still passing gas. Feels her abdomen is tight, pants are fitting tighter.   HPI     Home Medications Prior to Admission medications   Medication Sig Start Date End Date Taking? Authorizing Provider  albuterol (VENTOLIN HFA) 108 (90 Base) MCG/ACT inhaler Inhale 2 puffs into the lungs every 6 (six) hours as needed. 01/08/21   [name], FNP  clonazePAM (KLONOPIN) 0.5 MG tablet Take 1 tablet (0.5 mg total) by mouth 2 (two) times daily. Patient taking differently: Take 0.5 mg by mouth 2 (two) times daily as needed for anxiety. 08/30/22   [name], FNP  Ensure Max Protein (ENSURE MAX PROTEIN) LIQD Take 330 mLs (11 oz total) by mouth 2 (two) times daily. 09/11/22   Hongalgi, Maximino Greenland, MD  famotidine (PEPCID) 20 MG tablet Take 1 tablet (20 mg total) by mouth 2 (two) times daily. 08/22/22   [name], MD  fluconazole (DIFLUCAN) 150 MG tablet Take 1 tablet by mouth every 3 days as needed (yeast infection). 08/29/22   [name], FNP  folic acid (FOLVITE) 1 MG tablet Take 1 tablet by mouth once daily 09/16/22   Heilingoetter, [name], PA-C  furosemide (LASIX) 20 MG tablet Take 1 tablet (20 mg total) by mouth daily as needed for fluid or edema. 09/20/22   [name], FNP  hydrOXYzine (ATARAX) 10 MG tablet Take 1 tablet (10 mg) by mouth 3 times daily as needed for anxiety. 09/30/22   Kathlen Mody, MD   insulin glargine (LANTUS SOLOSTAR) 100 UNIT/ML Solostar Pen Inject 15 Units into the skin daily. 09/12/22   Hongalgi, Maximino Greenland, MD  Insulin Pen Needle (PEN NEEDLES) 32G X 4 MM MISC Use to inject insulin daily as directed. 07/09/22   [name], FNP  levETIRAcetam (KEPPRA) 1000 MG tablet Take 1 tablet (1,000 mg total) by mouth 2 (two) times daily. 07/31/22   [name], MD  levofloxacin (LEVAQUIN) 750 MG tablet Take 1 tablet (750 mg total) by mouth daily. 09/26/22 10/30/22  Kathlen Mody, MD  lidocaine (XYLOCAINE) 5 % ointment Apply topically as needed. Patient taking differently: Apply 1 Application topically as needed for mild pain. 05/30/22   [name], FNP  lidocaine-prilocaine (EMLA) cream Apply to port prior to access 04/25/22   [name], MD  lipase/protease/amylase (CREON) 36000 UNITS CPEP capsule Take 36,000-72,000 Units by mouth as directed. Take 2 Capsules (72,000 units) with meals and Take 1 Capsule (36,000 units) with snacks.    [provider]  metFORMIN (GLUCOPHAGE) 1000 MG tablet Take 1 tablet (1,000 mg total) by mouth 2 (two) times daily. 09/16/22   [name], FNP  micafungin in sodium chloride 0.9 % 100 mL Micafungin 100mg  IV q24h.  Last dose 10/15/2022 Labs: Labs weekly while on IV antibiotics: _x_ CBC with differential __x CMP __x CRP _x_ ESR _x_ Please leave PIC in place until doctor has seen patient  or been notified   Fax weekly labs to [phone] 09/30/22   Danelle Earthly, MD  MYLANTA MAXIMUM STRENGTH [phone] MG/5ML suspension Take 15 mLs by mouth See admin instructions. Take 15 ml's by mouth midday and bedtime    [provider]  ondansetron (ZOFRAN) 4 MG tablet Take 1 tablet (4 mg total) by mouth every 6 (six) hours as needed for nausea. Patient taking differently: Take 4-8 mg by mouth every 6 (six) hours as needed for nausea. 07/17/22   [name], [name], MD  oxyCODONE (OXY IR/ROXICODONE) 5 MG immediate release tablet Take  1 tablet (5 mg total) by mouth every 8 (eight) hours as needed for moderate pain or severe pain. 09/20/22   [name], FNP  oxycodone (OXY-IR) 5 MG capsule Take 1 capsule (5 mg total) by mouth every 6 (six) hours as needed. 10/18/22   [name], FNP  oxycodone (OXY-IR) 5 MG capsule Take 1 capsule (5 mg total) by mouth every 6 (six) hours as needed. 11/14/22   [name], FNP  pantoprazole (PROTONIX) 40 MG tablet Take 1 tablet (40 mg total) by mouth daily. 09/13/22   Hongalgi, Maximino Greenland, MD  polyethylene glycol powder (GLYCOLAX/MIRALAX) 17 GM/SCOOP powder Take 17 g by mouth daily as needed for mild constipation. 09/30/22   Kathlen Mody, MD  prochlorperazine (COMPAZINE) 10 MG tablet Take 1 tablet (10 mg total) by mouth every 6 (six) hours as needed. Patient taking differently: Take 10 mg by mouth every 6 (six) hours as needed for nausea or vomiting. 05/30/22   Heilingoetter, [name], PA-C  triamcinolone ointment (KENALOG) 0.5 % Apply topically to affected area twice daily as directed, 07/30/22   Johnson, Clanford L, MD  TYLENOL 500 MG tablet Take 1,000 mg by mouth every 6 (six) hours as needed for mild pain or headache.    [provider]  valACYclovir (VALTREX) 500 MG tablet Take 1 tablet (500 mg) by mouth 2 times daily. 09/23/22   [name], FNP      Allergies    Adhesive [tape] and Statins    Review of Systems   Review of Systems  Constitutional:  Positive for appetite change.  Gastrointestinal:  Positive for abdominal pain and nausea.  Neurological:  Positive for weakness.  Psychiatric/Behavioral:  Positive for sleep disturbance.   All other systems reviewed and are negative.   Physical Exam Updated Vital Signs BP (!) 120/54 (BP Location: Right Arm)   Pulse 80   Temp 98.1 F (36.7 C) (Oral)   Resp 15   SpO2 93%  Physical Exam Vitals and nursing note reviewed.  Constitutional:      Appearance: Normal appearance.  HENT:     Head: Normocephalic and  atraumatic.  Eyes:     Conjunctiva/sclera: Conjunctivae normal.  Cardiovascular:     Rate and Rhythm: Normal rate and regular rhythm.  Pulmonary:     Effort: Pulmonary effort is normal. No respiratory distress.     Breath sounds: Normal breath sounds.  Chest:     Comments: Port access present in upper right chest Abdominal:     General: There is no distension.     Palpations: Abdomen is soft.     Tenderness: There is generalized abdominal tenderness and tenderness in the right upper quadrant.  Skin:    General: Skin is warm and dry.  Neurological:     General: No focal deficit present.     Mental Status: She is alert.  ED Results / Procedures / Treatments   Labs (all labs ordered are listed, but only abnormal results are displayed) Labs Reviewed  LIPASE, BLOOD - Abnormal; Notable for the following components:      Result Value   Lipase 215 (*)    All other components within normal limits  COMPREHENSIVE METABOLIC PANEL - Abnormal; Notable for the following components:   Sodium 132 (*)    Potassium 3.3 (*)    Chloride 95 (*)    Glucose, Bld 204 (*)    Calcium 8.2 (*)    Total Protein 5.6 (*)    Albumin 2.3 (*)    All other components within normal limits  CBC - Abnormal; Notable for the following components:   RBC 3.03 (*)    Hemoglobin 9.3 (*)    HCT 29.4 (*)    RDW 16.5 (*)    All other components within normal limits  URINALYSIS, ROUTINE W REFLEX MICROSCOPIC - Abnormal; Notable for the following components:   APPearance HAZY (*)    All other components within normal limits    EKG EKG Interpretation Date/Time:  Saturday October 05 2022 06:40:26 EDT Ventricular Rate:  83 PR Interval:  151 QRS Duration:  99 QT Interval:  390 QTC Calculation: 459 R Axis:   -30  Text Interpretation: Sinus rhythm Left axis deviation Low voltage, precordial leads No significant change was found Confirmed by Glynn Octave [phone]) on 10/05/2022 7:28:05 AM  Radiology CT ABDOMEN  PELVIS W CONTRAST  Result Date: 10/05/2022 CLINICAL DATA:  Abdominal pain. History of lung cancer. * Tracking Code: BO *. EXAM: CT ABDOMEN AND PELVIS WITH CONTRAST TECHNIQUE: Multidetector CT imaging of the abdomen and pelvis was performed using the standard protocol following bolus administration of intravenous contrast. RADIATION DOSE REDUCTION: This exam was performed according to the departmental dose-optimization program which includes automated exposure control, adjustment of the mA and/or kV according to patient size and/or use of iterative reconstruction technique. CONTRAST:  OMNIPAQUE IOHEXOL 300 MG/ML  SOLN COMPARISON:  09/21/2022 FINDINGS: Lower chest: Posteromedial left lower lobe lung mass is again noted measuring 6.1 x 4.8 cm, image 14/6. On the previous exam this measured 5.4 x 4.0 cm. Adjacent pleural thickening is identified. Hepatobiliary: No suspicious liver lesion identified. Large stone within the gallbladder measures 4.6 cm. Sludge noted within the gallbladder fundus. The common bile duct measures up to 0.9 cm which when compared with the previous exam is unchanged. No signs of choledocholithiasis or obstructing mass. Pancreas: Mild soft tissue stranding about the pancreatic parenchyma is identified, increased from 09/21/2022. Small cystic lesion within the head of pancreas measures 9 mm and is unchanged. Arising off the anterior aspect of the distal tail of pancreas is a small fluid collection measuring 1.6 x 1.2 cm, image 22/3. Previously 1.6 x 0.8 cm. Mild increase caliber of the main duct at the level of the body and tail which appears new from the previous exam measuring up to 4 mm. Spleen: Normal in size without focal abnormality. Adrenals/Urinary Tract: Normal adrenal glands. No kidney mass or hydronephrosis. Bladder is unremarkable. Stomach/Bowel: Since the previous exam there is been interval placement of a transgastric drainage catheter into the large lesser sac pseudocyst.  The pseudocyst has significantly diminished in volume compared with the previous exam. Currently this measures 4.7 x 3.7 by 7.6 cm. Formally 12.3 x 6.8 by 9.3 cm. New small fluid collection between the pancreatic body and a and stomach measures 2.3 x 1.9 cm. No pathologic dilatation  of the large or small bowel loops. No bowel wall thickening or inflammation Vascular/Lymphatic: Aortic atherosclerosis. The upper abdominal vascularity including the portal vein and splenic vein remain patent. No signs of abdominopelvic adenopathy. Reproductive: Status post hysterectomy. No adnexal masses. Other: No pneumoperitoneum identified.  No significant free fluid.2 Musculoskeletal: No acute or significant osseous findings. IMPRESSION: 1. Since the previous exam there has been interval placement of a transgastric drainage catheter into the large lesser sac pseudocyst. The pseudocyst has significantly diminished in volume compared with the previous exam. 2. New small fluid collection between the pancreatic body and stomach measures 2.3 x 1.9 cm. Favor small pseudocysts. 3. Mild soft tissue stranding about the pancreatic parenchyma is identified, increased from the previous exam. Findings are favored to represent acute pancreatitis. 4. Mild increase caliber of the main duct at the level of the body and tail of the pancreas which appears new from the previous exam measuring up to 4 mm. 5. Cholelithiasis. 6. Mild increase in size of the left lower lobe lung mass. 7.  Aortic Atherosclerosis (ICD10-I70.0). Electronically Signed   By: Signa Kell M.D.   On: 10/05/2022 10:34    Procedures Procedures    Medications Ordered in ED Medications  HYDROmorphone (DILAUDID) injection 1 mg (1 mg Intravenous Given 10/05/22 0801)  ondansetron (ZOFRAN) injection 4 mg (4 mg Intravenous Given 10/05/22 0801)  sodium chloride 0.9 % bolus 500 mL (500 mLs Intravenous New Bag/Given 10/05/22 0946)  iohexol (OMNIPAQUE) 300 MG/ML solution 100 mL (100  mLs Intravenous Contrast Given 10/05/22 0940)  HYDROmorphone (DILAUDID) injection 1 mg (1 mg Intravenous Given 10/05/22 1039)    ED Course/ Medical Decision Making/ A&P                             Medical Decision Making Amount and/or Complexity of Data Reviewed Labs: ordered. Radiology: ordered.  Risk Prescription drug management.  This patient is a 62 y.o. female  who presents to the ED for concern of constipation.   Differential diagnoses prior to evaluation: The emergent differential diagnosis includes, but is not limited to,  idiopathic/functional constipation, SBO. This is not an exhaustive differential.   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 Pertinent results include: Pt was discharged on 7/15 after she presented with abdominal pain and was diagnosed with pancreatic cyst, underwent cystogastrostomy stent. Cyst aspiration grew out candida and strep viridans.   Physical Exam: Physical exam performed. The pertinent findings include: Normal vitals, no distress.   Lab Tests/Imaging studies: I personally interpreted labs/imaging and the pertinent results include:  no leukocytosis, stable hemoglobin. Electrolytes grossly unremarkable, decreased total protein and albumin relatively consistent with patient's prior. Normal kidney function. Lipase 215, elevated since patient admission at 170. UA negative for hematuria or infection.   CT abdomen/pelvis shows pancreatic pseudocyst improvement, new small fluid collections consistent with small pseudocysts, soft tissue stranding around pancreatic parenchyma suggesting acute pancreatitis. I agree with the radiologist interpretation.  Cardiac monitoring: EKG obtained and interpreted by myself and attending physician which shows: sinus rhythm   Medications: I  ordered medication including zofran and dilaudid.  I have reviewed the patients home medicines and have made adjustments as needed.  Consultations obtained: I consulted with gastroenterologist Dr Lorenso Quarry who reviewed patient's lab and CT imaging. She does not believe patient is requiring endoscopic  intervention at this time. Pancreatic cyst with stent is draining well, but agrees with new evidence of acute pancreatitis. She will formally consult once admitted.   I consulted with hospitalist Dr Jacqulyn Bath who will admit.    Disposition: After consideration of the diagnostic results and the patients response to treatment, I feel that patient would benefit from medical admission for further management of acute pancreatitis in the setting of cystogastrostomy stent and intractable pain.   Final Clinical Impression(s) / ED Diagnoses Final diagnoses:  Acute pancreatitis, unspecified complication status, unspecified pancreatitis type  Pancreatic cyst    Rx / DC Orders ED Discharge Orders     None      Portions of this report may have been transcribed using voice recognition software. Every effort was made to ensure accuracy; however, inadvertent computerized transcription errors may be present.    Jeanella Flattery 10/05/22 1123    Lorre Nick, MD 10/06/22 [phone]

## 2022-10-05 NOTE — ED Notes (Signed)
Patient has a urine culture in the main lab 

## 2022-10-05 NOTE — Consult Note (Signed)
[facility] Consult  Referring Provider: No ref. provider found Primary Care Physician:  [name], FNP Primary Gastroenterologist: Gentry Fitz  Reason for Consultation: Pancreatitis, abnormal CT imaging  SUBJECTIVE:   HPI: Carolyn Hooper is a 62 y.o. female with past medical history significant for lung cancer metastatic to brain, seizure disorder, diabetes mellitus, hypertension, acute pancreatitis complicated by pseudocyst requiring Axios drain placement on 09/26/2022 (Dr. Meridee Score).  Presented to hospital with worsening of abdominal pain.  She noted that she has poor oral intake and poor appetite prior to admission.  No chest pain or shortness of breath.  Abdominal pain has improved somewhat since her arrival.  No significant nausea or vomiting.  Has been having constipation as she has not been able to take much oral intake, has been seeing some faint blood per rectum.  CT imaging on presentation showed decreased size of pseudocyst with Axios stent, new small fluid collection 1.6 x 1.2 cm off anterior aspect of distal pancreatic tail, main pancreatic duct mild caliber increased to 4 mm, common bile duct 9 mm unchanged, increased mild soft tissue stranding of pancreatic parenchyma new from 09/21/2022.  Labs on presentation showed lipase 215 ([id number] on 09/04/2022), AST/ALT 16/18, alkaline phosphatase 65, total bilirubin 0.9.  Gastroenterology contacted for further recommendations.  Past Medical History:  Diagnosis Date   Abscess    hydroadenitis superativa   Anxiety    DM (diabetes mellitus) (HCC)    Hyperinsulinemia    Hypertension 1983   gestational - pre-eclampsia   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[facility])    Neuroma    audiotry neuroma   Pancreatitis    Seizure (HCC)    Vertigo    Past Surgical History:  Procedure Laterality Date   ABDOMINAL HYSTERECTOMY  2006   remaining left ovary   BALLOON DILATION N/A 09/26/2022   Procedure: BALLOON DILATION;  Surgeon:  Lemar Lofty., MD;  Location: [facility];  Service: Gastroenterology;  Laterality: N/A;   BIOPSY  09/26/2022   Procedure: BIOPSY;  Surgeon: Meridee Score Netty Starring., MD;  Location: [facility];  Service: Gastroenterology;;   BRONCHIAL BIOPSY  02/19/2022   Procedure: BRONCHIAL BIOPSIES;  Surgeon: Josephine Igo, DO;  Location: [facility];  Service: Pulmonary;;   BRONCHIAL BRUSHINGS  02/19/2022   Procedure: BRONCHIAL BRUSHINGS;  Surgeon: Josephine Igo, DO;  Location: [facility];  Service: Pulmonary;;   BRONCHIAL NEEDLE ASPIRATION BIOPSY  02/19/2022   Procedure: BRONCHIAL NEEDLE ASPIRATION BIOPSIES;  Surgeon: Josephine Igo, DO;  Location: [facility];  Service: Pulmonary;;   ESOPHAGOGASTRODUODENOSCOPY (EGD) WITH PROPOFOL N/A 09/26/2022   Procedure: ESOPHAGOGASTRODUODENOSCOPY (EGD) WITH PROPOFOL;  Surgeon: Lemar Lofty., MD;  Location: [facility];  Service: Gastroenterology;  Laterality: N/A;   IR IMAGING GUIDED PORT INSERTION  03/29/2022   PANCREATIC STENT PLACEMENT  09/26/2022   Procedure: PANCREATIC STENT PLACEMENT;  Surgeon: Meridee Score Netty Starring., MD;  Location: [facility];  Service: Gastroenterology;;  axios   SHOULDER SURGERY Right 1960s   UPPER ESOPHAGEAL ENDOSCOPIC ULTRASOUND (EUS) N/A 09/26/2022   Procedure: UPPER ESOPHAGEAL ENDOSCOPIC ULTRASOUND (EUS);  Surgeon: Lemar Lofty., MD;  Location: [facility];  Service: Gastroenterology;  Laterality: N/A;  For cyst drainage probably will need stent   Prior to Admission medications   Medication Sig Start Date End Date Taking? Authorizing Provider  albuterol (VENTOLIN HFA) 108 (90 Base) MCG/ACT inhaler Inhale 2 puffs into the lungs every 6 (six) hours as needed. 01/08/21   [name], FNP  clonazePAM (KLONOPIN) 0.5 MG  tablet Take 1 tablet (0.5 mg total) by mouth 2 (two) times daily. Patient taking differently: Take 0.5 mg by mouth 2 (two) times daily as needed for anxiety. 08/30/22   [name], FNP  Ensure Max Protein (ENSURE MAX PROTEIN) LIQD Take 330 mLs (11 oz total) by mouth 2 (two) times daily. 09/11/22   Hongalgi, Maximino Greenland, MD  famotidine (PEPCID) 20 MG tablet Take 1 tablet (20 mg total) by mouth 2 (two) times daily. 08/22/22   [name], MD  fluconazole (DIFLUCAN) 150 MG tablet Take 1 tablet by mouth every 3 days as needed (yeast infection). 08/29/22   [name], FNP  folic acid (FOLVITE) 1 MG tablet Take 1 tablet by mouth once daily 09/16/22   Heilingoetter, [name], PA-C  furosemide (LASIX) 20 MG tablet Take 1 tablet (20 mg total) by mouth daily as needed for fluid or edema. 09/20/22   [name], FNP  hydrOXYzine (ATARAX) 10 MG tablet Take 1 tablet (10 mg) by mouth 3 times daily as needed for anxiety. 09/30/22   Kathlen Mody, MD  insulin glargine (LANTUS SOLOSTAR) 100 UNIT/ML Solostar Pen Inject 15 Units into the skin daily. 09/12/22   Hongalgi, Maximino Greenland, MD  Insulin Pen Needle (PEN NEEDLES) 32G X 4 MM MISC Use to inject insulin daily as directed. 07/09/22   [name], FNP  levETIRAcetam (KEPPRA) 1000 MG tablet Take 1 tablet (1,000 mg total) by mouth 2 (two) times daily. 07/31/22   [name], MD  levofloxacin (LEVAQUIN) 750 MG tablet Take 1 tablet (750 mg total) by mouth daily. 09/26/22 10/30/22  Kathlen Mody, MD  lidocaine (XYLOCAINE) 5 % ointment Apply topically as needed. Patient taking differently: Apply 1 Application topically as needed for mild pain. 05/30/22   [name], FNP  lidocaine-prilocaine (EMLA) cream Apply to port prior to access 04/25/22   [name], MD  lipase/protease/amylase (CREON) 36000 UNITS CPEP capsule Take 36,000-72,000 Units by mouth as directed. Take 2 Capsules (72,000 units) with meals and Take 1 Capsule (36,000 units) with snacks.    [provider]  metFORMIN (GLUCOPHAGE) 1000 MG tablet Take 1 tablet (1,000 mg total) by mouth 2 (two) times daily. 09/16/22   [name], FNP  micafungin in sodium  chloride 0.9 % 100 mL Micafungin 100mg  IV q24h.  Last dose 10/15/2022 Labs: Labs weekly while on IV antibiotics: _x_ CBC with differential __x CMP __x CRP _x_ ESR _x_ Please leave PIC in place until doctor has seen patient or been notified   Fax weekly labs to [phone] 09/30/22   Danelle Earthly, MD  MYLANTA MAXIMUM STRENGTH 400-400-40 MG/5ML suspension Take 15 mLs by mouth See admin instructions. Take 15 ml's by mouth midday and bedtime    [provider]  ondansetron (ZOFRAN) 4 MG tablet Take 1 tablet (4 mg total) by mouth every 6 (six) hours as needed for nausea. Patient taking differently: Take 4-8 mg by mouth every 6 (six) hours as needed for nausea. 07/17/22   [name], [name], MD  oxyCODONE (OXY IR/ROXICODONE) 5 MG immediate release tablet Take 1 tablet (5 mg total) by mouth every 8 (eight) hours as needed for moderate pain or severe pain. 09/20/22   [name], FNP  oxycodone (OXY-IR) 5 MG capsule Take 1 capsule (5 mg total) by mouth every 6 (six) hours as needed. 10/18/22   [name], FNP  oxycodone (OXY-IR) 5 MG capsule Take 1 capsule (5 mg total) by  mouth every 6 (six) hours as needed. 11/14/22   [name], FNP  pantoprazole (PROTONIX) 40 MG tablet Take 1 tablet (40 mg total) by mouth daily. 09/13/22   Hongalgi, Maximino Greenland, MD  polyethylene glycol powder (GLYCOLAX/MIRALAX) 17 GM/SCOOP powder Take 17 g by mouth daily as needed for mild constipation. 09/30/22   Kathlen Mody, MD  prochlorperazine (COMPAZINE) 10 MG tablet Take 1 tablet (10 mg total) by mouth every 6 (six) hours as needed. Patient taking differently: Take 10 mg by mouth every 6 (six) hours as needed for nausea or vomiting. 05/30/22   Heilingoetter, [name], PA-C  triamcinolone ointment (KENALOG) 0.5 % Apply topically to affected area twice daily as directed, 07/30/22   Johnson, Clanford L, MD  TYLENOL 500 MG tablet Take 1,000 mg by mouth every 6 (six) hours as needed for mild pain or headache.     [provider]  valACYclovir (VALTREX) 500 MG tablet Take 1 tablet (500 mg) by mouth 2 times daily. 09/23/22   [name], FNP   Current Facility-Administered Medications  Medication Dose Route Frequency Provider Last Rate Last Admin   0.9 % NaCl with KCl 20 mEq/ L  infusion   Intravenous Continuous Pahwani, Daleen Bo, MD       acetaminophen (TYLENOL) tablet 650 mg  650 mg Oral Q6H PRN Hughie Closs, MD       Or   acetaminophen (TYLENOL) suppository 650 mg  650 mg Rectal Q6H PRN Hughie Closs, MD       albuterol (PROVENTIL) (2.5 MG/3ML) 0.083% nebulizer solution 2.5 mg  2.5 mg Nebulization Q6H PRN Lynden Ang, RPH       clonazePAM (KLONOPIN) tablet 0.5 mg  0.5 mg Oral BID Hughie Closs, MD       enoxaparin (LOVENOX) injection 40 mg  40 mg Subcutaneous Q24H Pahwani, Daleen Bo, MD       folic acid (FOLVITE) tablet 1 mg  1 mg Oral Daily Pahwani, Ravi, MD       insulin aspart (novoLOG) injection 0-15 Units  0-15 Units Subcutaneous Q4H Hughie Closs, MD       levETIRAcetam (KEPPRA) tablet 1,000 mg  1,000 mg Oral BID Hughie Closs, MD       levofloxacin (LEVAQUIN) tablet 750 mg  750 mg Oral Daily Pahwani, Daleen Bo, MD       lipase/protease/amylase (CREON) capsule 36,000-72,000 Units  36,000-72,000 Units Oral UD Hughie Closs, MD       micafungin (MYCAMINE) 100 mg in sodium chloride 0.9 % 100 mL IVPB  100 mg Intravenous Q24H Hughie Closs, MD       ondansetron (ZOFRAN) tablet 4 mg  4 mg Oral Q6H PRN Hughie Closs, MD       Or   ondansetron (ZOFRAN) injection 4 mg  4 mg Intravenous Q6H PRN Hughie Closs, MD       oxyCODONE (Oxy IR/ROXICODONE) immediate release tablet 5 mg  5 mg Oral Q8H PRN Pahwani, Ravi, MD       potassium chloride 10 mEq in 100 mL IVPB  10 mEq Intravenous Q1 Hr x 4 Pahwani, Ravi, MD 100 mL/hr at 10/05/22 1249 10 mEq at 10/05/22 1249   valACYclovir (VALTREX) tablet 500 mg  500 mg Oral BID Hughie Closs, MD       Current Outpatient Medications  Medication Sig Dispense Refill    albuterol (VENTOLIN HFA) 108 (90 Base) MCG/ACT inhaler Inhale 2 puffs into the lungs every 6 (six) hours as needed. 18 g 2   clonazePAM (  KLONOPIN) 0.5 MG tablet Take 1 tablet (0.5 mg total) by mouth 2 (two) times daily. (Patient taking differently: Take 0.5 mg by mouth 2 (two) times daily as needed for anxiety.) 60 tablet 2   Ensure Max Protein (ENSURE MAX PROTEIN) LIQD Take 330 mLs (11 oz total) by mouth 2 (two) times daily. 330 mL 15   famotidine (PEPCID) 20 MG tablet Take 1 tablet (20 mg total) by mouth 2 (two) times daily. 30 tablet 0   fluconazole (DIFLUCAN) 150 MG tablet Take 1 tablet by mouth every 3 days as needed (yeast infection). 3 tablet 0   folic acid (FOLVITE) 1 MG tablet Take 1 tablet by mouth once daily 30 tablet 0   furosemide (LASIX) 20 MG tablet Take 1 tablet (20 mg total) by mouth daily as needed for fluid or edema. 30 tablet 1   hydrOXYzine (ATARAX) 10 MG tablet Take 1 tablet (10 mg) by mouth 3 times daily as needed for anxiety. 30 tablet 0   insulin glargine (LANTUS SOLOSTAR) 100 UNIT/ML Solostar Pen Inject 15 Units into the skin daily.     Insulin Pen Needle (PEN NEEDLES) 32G X 4 MM MISC Use to inject insulin daily as directed. 100 each 11   levETIRAcetam (KEPPRA) 1000 MG tablet Take 1 tablet (1,000 mg total) by mouth 2 (two) times daily. 60 tablet 2   levofloxacin (LEVAQUIN) 750 MG tablet Take 1 tablet (750 mg total) by mouth daily. 30 tablet 0   lidocaine (XYLOCAINE) 5 % ointment Apply topically as needed. (Patient taking differently: Apply 1 Application topically as needed for mild pain.) 60 g 1   lidocaine-prilocaine (EMLA) cream Apply to port prior to access 30 g 0   lipase/protease/amylase (CREON) 36000 UNITS CPEP capsule Take 36,000-72,000 Units by mouth as directed. Take 2 Capsules (72,000 units) with meals and Take 1 Capsule (36,000 units) with snacks.     metFORMIN (GLUCOPHAGE) 1000 MG tablet Take 1 tablet (1,000 mg total) by mouth 2 (two) times daily. 180 tablet 1    micafungin in sodium chloride 0.9 % 100 mL Micafungin 100mg  IV q24h.  Last dose 10/15/2022 Labs: Labs weekly while on IV antibiotics: _x_ CBC with differential __x CMP __x CRP _x_ ESR _x_ Please leave PIC in place until doctor has seen patient or been notified   Fax weekly labs to [phone] 15 Doses/Fill 0   MYLANTA MAXIMUM STRENGTH 400-400-40 MG/5ML suspension Take 15 mLs by mouth See admin instructions. Take 15 ml's by mouth midday and bedtime     ondansetron (ZOFRAN) 4 MG tablet Take 1 tablet (4 mg total) by mouth every 6 (six) hours as needed for nausea. (Patient taking differently: Take 4-8 mg by mouth every 6 (six) hours as needed for nausea.) 30 tablet 2   oxyCODONE (OXY IR/ROXICODONE) 5 MG immediate release tablet Take 1 tablet (5 mg total) by mouth every 8 (eight) hours as needed for moderate pain or severe pain. 60 tablet 0   [START ON 10/18/2022] oxycodone (OXY-IR) 5 MG capsule Take 1 capsule (5 mg total) by mouth every 6 (six) hours as needed. 60 capsule 0   [START ON 11/14/2022] oxycodone (OXY-IR) 5 MG capsule Take 1 capsule (5 mg total) by mouth every 6 (six) hours as needed. 60 capsule 0   pantoprazole (PROTONIX) 40 MG tablet Take 1 tablet (40 mg total) by mouth daily. 30 tablet 1   polyethylene glycol powder (GLYCOLAX/MIRALAX) 17 GM/SCOOP powder Take 17 g by mouth daily as needed for mild  constipation. 238 g 0   prochlorperazine (COMPAZINE) 10 MG tablet Take 1 tablet (10 mg total) by mouth every 6 (six) hours as needed. (Patient taking differently: Take 10 mg by mouth every 6 (six) hours as needed for nausea or vomiting.) 30 tablet 2   triamcinolone ointment (KENALOG) 0.5 % Apply topically to affected area twice daily as directed, 30 g 0   TYLENOL 500 MG tablet Take 1,000 mg by mouth every 6 (six) hours as needed for mild pain or headache.     valACYclovir (VALTREX) 500 MG tablet Take 1 tablet (500 mg) by mouth 2 times daily. 60 tablet 2   Allergies as of 10/05/2022 - Review  Complete 10/05/2022  Allergen Reaction Noted   Adhesive [tape] Dermatitis 04/07/2011   Statins Itching and Other (See Comments) 01/08/2021   Family History  Problem Relation Age of Onset   COPD Mother    Heart disease Father    Diabetes Father    Kidney disease Father    Arthritis Father        RA   Liver disease Father    Social History   Socioeconomic History   Marital status: Divorced    Spouse name: Not on file   Number of children: Not on file   Years of education: Not on file   Highest education level: Not on file  Occupational History   Not on file  Tobacco Use   Smoking status: Former    Current packs/day: 0.00    Types: Cigarettes    Quit date: 02/11/2022    Years since quitting: 0.6   Smokeless tobacco: Never  Vaping Use   Vaping status: Never Used  Substance and Sexual Activity   Alcohol use: Yes    Comment: very rare   Drug use: No   Sexual activity: Not on file  Other Topics Concern   Not on file  Social History Narrative   Not on file   Social Determinants of Health   Financial Resource Strain: Not on file  Food Insecurity: No Food Insecurity (09/21/2022)   Hunger Vital Sign    Worried About Running Out of Food in the Last Year: Never true    Ran Out of Food in the Last Year: Never true  Transportation Needs: No Transportation Needs (09/21/2022)   PRAPARE - Administrator, Civil Service (Medical): No    Lack of Transportation (Non-Medical): No  Physical Activity: Not on file  Stress: Not on file  Social Connections: Not on file  Intimate Partner Violence: Not At Risk (09/21/2022)   Humiliation, Afraid, Rape, and Kick questionnaire    Fear of Current or Ex-Partner: No    Emotionally Abused: No    Physically Abused: No    Sexually Abused: No   Review of Systems:  Review of Systems  Respiratory:  Negative for shortness of breath.   Cardiovascular:  Negative for chest pain.  Gastrointestinal:  Positive for abdominal pain, blood in  stool, constipation and nausea.    OBJECTIVE:   Temp:  [97.5 F (36.4 C)-98.1 F (36.7 C)] 98.1 F (36.7 C) (07/20 1048) Pulse Rate:  [80-84] 80 (07/20 1048) Resp:  [15] 15 (07/20 1048) BP: (108-120)/(54-74) 120/54 (07/20 1048) SpO2:  [93 %-100 %] 93 % (07/20 1048)   Physical Exam Constitutional:      General: She is not in acute distress.    Appearance: She is not ill-appearing, toxic-appearing or diaphoretic.  Cardiovascular:     Rate and Rhythm:  Normal rate and regular rhythm.  Pulmonary:     Effort: No respiratory distress.     Breath sounds: Normal breath sounds.     Comments: Supplemental oxygen nasal cannula 2.5 L Abdominal:     General: Bowel sounds are normal. There is no distension.     Palpations: Abdomen is soft.     Tenderness: There is abdominal tenderness. There is no guarding.  Musculoskeletal:     Right lower leg: No edema.     Left lower leg: No edema.  Skin:    General: Skin is warm and dry.  Neurological:     Mental Status: She is alert.     Labs: Recent Labs    10/05/22 0722 10/05/22 1249  WBC 4.2 3.8*  HGB 9.3* 9.1*  HCT 29.4* 29.0*  PLT 187 192   BMET Recent Labs    10/05/22 0722  NA 132*  K 3.3*  CL 95*  CO2 26  GLUCOSE 204*  BUN 12  CREATININE 0.74  CALCIUM 8.2*   LFT Recent Labs    10/05/22 0722  PROT 5.6*  ALBUMIN 2.3*  AST 15  ALT 18  ALKPHOS 65  BILITOT 0.9   PT/INR No results for input(s): "LABPROT", "INR" in the last 72 hours.  Diagnostic imaging: CT ABDOMEN PELVIS W CONTRAST  Result Date: 10/05/2022 CLINICAL DATA:  Abdominal pain. History of lung cancer. * Tracking Code: BO *. EXAM: CT ABDOMEN AND PELVIS WITH CONTRAST TECHNIQUE: Multidetector CT imaging of the abdomen and pelvis was performed using the standard protocol following bolus administration of intravenous contrast. RADIATION DOSE REDUCTION: This exam was performed according to the departmental dose-optimization program which includes automated  exposure control, adjustment of the mA and/or kV according to patient size and/or use of iterative reconstruction technique. CONTRAST:  OMNIPAQUE IOHEXOL 300 MG/ML  SOLN COMPARISON:  09/21/2022 FINDINGS: Lower chest: Posteromedial left lower lobe lung mass is again noted measuring 6.1 x 4.8 cm, image 14/6. On the previous exam this measured 5.4 x 4.0 cm. Adjacent pleural thickening is identified. Hepatobiliary: No suspicious liver lesion identified. Large stone within the gallbladder measures 4.6 cm. Sludge noted within the gallbladder fundus. The common bile duct measures up to 0.9 cm which when compared with the previous exam is unchanged. No signs of choledocholithiasis or obstructing mass. Pancreas: Mild soft tissue stranding about the pancreatic parenchyma is identified, increased from 09/21/2022. Small cystic lesion within the head of pancreas measures 9 mm and is unchanged. Arising off the anterior aspect of the distal tail of pancreas is a small fluid collection measuring 1.6 x 1.2 cm, image 22/3. Previously 1.6 x 0.8 cm. Mild increase caliber of the main duct at the level of the body and tail which appears new from the previous exam measuring up to 4 mm. Spleen: Normal in size without focal abnormality. Adrenals/Urinary Tract: Normal adrenal glands. No kidney mass or hydronephrosis. Bladder is unremarkable. Stomach/Bowel: Since the previous exam there is been interval placement of a transgastric drainage catheter into the large lesser sac pseudocyst. The pseudocyst has significantly diminished in volume compared with the previous exam. Currently this measures 4.7 x 3.7 by 7.6 cm. Formally 12.3 x 6.8 by 9.3 cm. New small fluid collection between the pancreatic body and a and stomach measures 2.3 x 1.9 cm. No pathologic dilatation of the large or small bowel loops. No bowel wall thickening or inflammation Vascular/Lymphatic: Aortic atherosclerosis. The upper abdominal vascularity including the portal  vein and splenic vein remain patent.  No signs of abdominopelvic adenopathy. Reproductive: Status post hysterectomy. No adnexal masses. Other: No pneumoperitoneum identified.  No significant free fluid.2 Musculoskeletal: No acute or significant osseous findings. IMPRESSION: 1. Since the previous exam there has been interval placement of a transgastric drainage catheter into the large lesser sac pseudocyst. The pseudocyst has significantly diminished in volume compared with the previous exam. 2. New small fluid collection between the pancreatic body and stomach measures 2.3 x 1.9 cm. Favor small pseudocysts. 3. Mild soft tissue stranding about the pancreatic parenchyma is identified, increased from the previous exam. Findings are favored to represent acute pancreatitis. 4. Mild increase caliber of the main duct at the level of the body and tail of the pancreas which appears new from the previous exam measuring up to 4 mm. 5. Cholelithiasis. 6. Mild increase in size of the left lower lobe lung mass. 7.  Aortic Atherosclerosis (ICD10-I70.0). Electronically Signed   By: Signa Kell M.D.   On: 10/05/2022 10:34    IMPRESSION: Acute pancreatitis complicated by pseudocyst  -Status post cystogastrostomy (Axios) on 09/26/2022 with placement of 7 cm 10 French double-pigtail stent into pseudocyst through cystogastrostomy  -New, small fluid collection between pancreatic body and stomach measuring 2.3 x 1.9 cm 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Hypertension Diabetes mellitus Seizure disorder  PLAN: - Imaging studies appear improved with regard to large pseudocyst/drainage, there is new small fluid collection/pseudocyst between pancreatic body and stomach, there does appear to be increased mild soft tissue stranding about the pancreatic parenchyma favoring acute pancreatitis - Recommend continued supportive care for pancreatitis including IV fluids, pain control, diet as tolerated - No plan for endoscopic intervention  at this time - Recommended patient follow a low-fat diet diet on discharge - Follow-up with Dr. Meridee Score for EGD on 11/07/2022 - Please recall [facility] if further questions   LOS: 0 days   Liliane Shi, [facility]

## 2022-10-05 NOTE — ED Triage Notes (Signed)
Pt BIB [facility] from home. Pt c/o of constipation and nausea with no vomitting. Pt reports being constipated since she was discharged 5 days ago.Pt c/o weakness and insomnia since being home. Aox4 Lung sounds clear  116/74 90 HR 100% RA 217 cbg  97.2T 18RR

## 2022-10-05 NOTE — H&P (Signed)
History and Physical    [name] XLK:[id number] DOB: 1960/06/30 DOA: 10/05/2022  PCP: [name], FNP  Patient coming from: Home  I have personally briefly reviewed patient's old medical records in [facility]  Chief Complaint: Constipation, intermittent abdominal pain and nausea  HPI: Carolyn Hooper is a 62 y.o. 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  Currently on immunotherapy / chemotherapy with Dr. Shirline Frees, anxiety, recent hospitalization for symptomatic pancreatic cyst, she was hospitalized from 09/21/2022 through 09/30/2022 and underwent EUS as well as stent placement for the cyst who returns to the ED with a complaint of constipation for 5 days, having no bowel movement but passing very little gas.  Also complains of intermittent generalized abdominal pain, more pronounced in the epigastric region with intermittent nausea.  No fever, chills, sweating, any problem with urination, chest pain, shortness of breath or any other complaint.  ED Course: Upon arrival to ED, she was fairly hemodynamically stable.  CT abdomen showed evidence of yet another episode of acute pancreatitis with new small fluid collection favored as pseudocyst, previous large pseudocyst appears to be diminished in size.  EDP discussed the case with GI, who indicated that patient does not need any GI intervention however they will see patient in consultation.  Hospitalist were consulted for admission afterwards.  Patient received several rounds of IV Dilaudid.  She was hypokalemic as well.  Review of Systems: As per HPI otherwise negative.    Past Medical History:  Diagnosis Date   Abscess    hydroadenitis superativa   Anxiety    DM (diabetes mellitus) (HCC)    Hyperinsulinemia    Hypertension 1983   gestational - pre-eclampsia   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[facility])    Neuroma    audiotry neuroma   Pancreatitis    Seizure (HCC)     Vertigo     Past Surgical History:  Procedure Laterality Date   ABDOMINAL HYSTERECTOMY  2006   remaining left ovary   BALLOON DILATION N/A 09/26/2022   Procedure: BALLOON DILATION;  Surgeon: Lemar Lofty., MD;  Location: [facility];  Service: Gastroenterology;  Laterality: N/A;   BIOPSY  09/26/2022   Procedure: BIOPSY;  Surgeon: Meridee Score Netty Starring., MD;  Location: [facility];  Service: Gastroenterology;;   BRONCHIAL BIOPSY  02/19/2022   Procedure: BRONCHIAL BIOPSIES;  Surgeon: Josephine Igo, DO;  Location: [facility];  Service: Pulmonary;;   BRONCHIAL BRUSHINGS  02/19/2022   Procedure: BRONCHIAL BRUSHINGS;  Surgeon: Josephine Igo, DO;  Location: [facility];  Service: Pulmonary;;   BRONCHIAL NEEDLE ASPIRATION BIOPSY  02/19/2022   Procedure: BRONCHIAL NEEDLE ASPIRATION BIOPSIES;  Surgeon: Josephine Igo, DO;  Location: [facility];  Service: Pulmonary;;   ESOPHAGOGASTRODUODENOSCOPY (EGD) WITH PROPOFOL N/A 09/26/2022   Procedure: ESOPHAGOGASTRODUODENOSCOPY (EGD) WITH PROPOFOL;  Surgeon: Lemar Lofty., MD;  Location: [facility];  Service: Gastroenterology;  Laterality: N/A;   IR IMAGING GUIDED PORT INSERTION  03/29/2022   PANCREATIC STENT PLACEMENT  09/26/2022   Procedure: PANCREATIC STENT PLACEMENT;  Surgeon: Meridee Score Netty Starring., MD;  Location: [facility];  Service: Gastroenterology;;  axios   SHOULDER SURGERY Right 1960s   UPPER ESOPHAGEAL ENDOSCOPIC ULTRASOUND (EUS) N/A 09/26/2022   Procedure: UPPER ESOPHAGEAL ENDOSCOPIC ULTRASOUND (EUS);  Surgeon: Lemar Lofty., MD;  Location: [facility];  Service: Gastroenterology;  Laterality: N/A;  For cyst drainage probably will need stent     reports that she quit smoking  about 7 months ago. Her smoking use included cigarettes. She has never used smokeless tobacco. She reports current alcohol use. She reports that she does not use drugs.  Allergies  Allergen Reactions   Adhesive [Tape] Dermatitis    Statins Itching and Other (See Comments)    Myalgias, also    Family History  Problem Relation Age of Onset   COPD Mother    Heart disease Father    Diabetes Father    Kidney disease Father    Arthritis Father        RA   Liver disease Father     Prior to Admission medications   Medication Sig Start Date End Date Taking? Authorizing Provider  albuterol (VENTOLIN HFA) 108 (90 Base) MCG/ACT inhaler Inhale 2 puffs into the lungs every 6 (six) hours as needed. 01/08/21   [name], FNP  clonazePAM (KLONOPIN) 0.5 MG tablet Take 1 tablet (0.5 mg total) by mouth 2 (two) times daily. Patient taking differently: Take 0.5 mg by mouth 2 (two) times daily as needed for anxiety. 08/30/22   [name], FNP  Ensure Max Protein (ENSURE MAX PROTEIN) LIQD Take 330 mLs (11 oz total) by mouth 2 (two) times daily. 09/11/22   Hongalgi, Maximino Greenland, MD  famotidine (PEPCID) 20 MG tablet Take 1 tablet (20 mg total) by mouth 2 (two) times daily. 08/22/22   [name], MD  fluconazole (DIFLUCAN) 150 MG tablet Take 1 tablet by mouth every 3 days as needed (yeast infection). 08/29/22   [name], FNP  folic acid (FOLVITE) 1 MG tablet Take 1 tablet by mouth once daily 09/16/22   Heilingoetter, [name], PA-C  furosemide (LASIX) 20 MG tablet Take 1 tablet (20 mg total) by mouth daily as needed for fluid or edema. 09/20/22   [name], FNP  hydrOXYzine (ATARAX) 10 MG tablet Take 1 tablet (10 mg) by mouth 3 times daily as needed for anxiety. 09/30/22   Kathlen Mody, MD  insulin glargine (LANTUS SOLOSTAR) 100 UNIT/ML Solostar Pen Inject 15 Units into the skin daily. 09/12/22   Hongalgi, Maximino Greenland, MD  Insulin Pen Needle (PEN NEEDLES) 32G X 4 MM MISC Use to inject insulin daily as directed. 07/09/22   [name], FNP  levETIRAcetam (KEPPRA) 1000 MG tablet Take 1 tablet (1,000 mg total) by mouth 2 (two) times daily. 07/31/22   [name], MD  levofloxacin (LEVAQUIN) 750 MG tablet Take 1  tablet (750 mg total) by mouth daily. 09/26/22 10/30/22  Kathlen Mody, MD  lidocaine (XYLOCAINE) 5 % ointment Apply topically as needed. Patient taking differently: Apply 1 Application topically as needed for mild pain. 05/30/22   [name], FNP  lidocaine-prilocaine (EMLA) cream Apply to port prior to access 04/25/22   [name], MD  lipase/protease/amylase (CREON) 36000 UNITS CPEP capsule Take 36,000-72,000 Units by mouth as directed. Take 2 Capsules (72,000 units) with meals and Take 1 Capsule (36,000 units) with snacks.    [provider]  metFORMIN (GLUCOPHAGE) 1000 MG tablet Take 1 tablet (1,000 mg total) by mouth 2 (two) times daily. 09/16/22   [name], FNP  micafungin in sodium chloride 0.9 % 100 mL Micafungin 100mg  IV q24h.  Last dose 10/15/2022 Labs: Labs weekly while on IV antibiotics: _x_ CBC with differential __x CMP __x CRP _x_ ESR _x_ Please leave PIC in place until doctor has seen patient or been notified   Fax weekly labs to [phone] 09/30/22  Danelle Earthly, MD  MYLANTA MAXIMUM STRENGTH [phone] MG/5ML suspension Take 15 mLs by mouth See admin instructions. Take 15 ml's by mouth midday and bedtime    [provider]  ondansetron (ZOFRAN) 4 MG tablet Take 1 tablet (4 mg total) by mouth every 6 (six) hours as needed for nausea. Patient taking differently: Take 4-8 mg by mouth every 6 (six) hours as needed for nausea. 07/17/22   [name], [name], MD  oxyCODONE (OXY IR/ROXICODONE) 5 MG immediate release tablet Take 1 tablet (5 mg total) by mouth every 8 (eight) hours as needed for moderate pain or severe pain. 09/20/22   [name], FNP  oxycodone (OXY-IR) 5 MG capsule Take 1 capsule (5 mg total) by mouth every 6 (six) hours as needed. 10/18/22   [name], FNP  oxycodone (OXY-IR) 5 MG capsule Take 1 capsule (5 mg total) by mouth every 6 (six) hours as needed. 11/14/22   [name], FNP  pantoprazole (PROTONIX) 40 MG tablet  Take 1 tablet (40 mg total) by mouth daily. 09/13/22   Hongalgi, Maximino Greenland, MD  polyethylene glycol powder (GLYCOLAX/MIRALAX) 17 GM/SCOOP powder Take 17 g by mouth daily as needed for mild constipation. 09/30/22   Kathlen Mody, MD  prochlorperazine (COMPAZINE) 10 MG tablet Take 1 tablet (10 mg total) by mouth every 6 (six) hours as needed. Patient taking differently: Take 10 mg by mouth every 6 (six) hours as needed for nausea or vomiting. 05/30/22   Heilingoetter, [name], PA-C  triamcinolone ointment (KENALOG) 0.5 % Apply topically to affected area twice daily as directed, 07/30/22   Johnson, Clanford L, MD  TYLENOL 500 MG tablet Take 1,000 mg by mouth every 6 (six) hours as needed for mild pain or headache.    [provider]  valACYclovir (VALTREX) 500 MG tablet Take 1 tablet (500 mg) by mouth 2 times daily. 09/23/22   [name], FNP    Physical Exam: Vitals:   10/05/22 0644 10/05/22 0645 10/05/22 0800 10/05/22 1048  BP: 108/74   (!) 120/54  Pulse: 82  84 80  Resp: 15   15  Temp:  (!) 97.5 F (36.4 C)  98.1 F (36.7 C)  TempSrc:  Axillary  Oral  SpO2: 100%   93%    Constitutional: NAD, calm, comfortable Vitals:   10/05/22 0644 10/05/22 0645 10/05/22 0800 10/05/22 1048  BP: 108/74   (!) 120/54  Pulse: 82  84 80  Resp: 15   15  Temp:  (!) 97.5 F (36.4 C)  98.1 F (36.7 C)  TempSrc:  Axillary  Oral  SpO2: 100%   93%   Eyes: PERRL, lids and conjunctivae normal, morbidly obese ENMT: Mucous membranes are moist. Posterior pharynx clear of any exudate or lesions.Normal dentition.  Neck: normal, supple, no masses, no thyromegaly Respiratory: clear to auscultation bilaterally, no wheezing, no crackles. Normal respiratory effort. No accessory muscle use.  Cardiovascular: Regular rate and rhythm, no murmurs / rubs / gallops. No extremity edema. 2+ pedal pulses. No carotid bruits.  Abdomen: Generalized tenderness, more pronounced in periumbilical and epigastric region, no  masses palpated. No hepatosplenomegaly. Bowel sounds positive.  Musculoskeletal: no clubbing / cyanosis. No joint deformity upper and lower extremities. Good ROM, no contractures. Normal muscle tone.  Skin: no rashes, lesions, ulcers. No induration Neurologic: CN 2-12 grossly intact. Sensation intact, DTR normal. Strength 5/5 in all 4.  Psychiatric: Normal judgment and insight. Alert and oriented x 3. Normal mood.  Labs on Admission: I have personally reviewed following labs and imaging studies  CBC: Recent Labs  Lab 09/28/22 1530 09/29/22 0357 10/05/22 0722  WBC  --  7.6 4.2  NEUTROABS  --  5.1  --   HGB 10.2* 10.3* 9.3*  HCT 32.4* 32.8* 29.4*  MCV  --  99.1 97.0  PLT  --  139* 187   Basic Metabolic Panel: Recent Labs  Lab 09/29/22 0357 09/30/22 1020 10/05/22 0722  NA 136 134* 132*  K 3.3* 3.5 3.3*  CL 99 99 95*  CO2 27 25 26   GLUCOSE 126* 166* 204*  BUN 10 9 12   CREATININE 0.55 0.66 0.74  CALCIUM 8.1* 7.9* 8.2*   GFR: Estimated Creatinine Clearance: 90.4 mL/min (by C-G formula based on SCr of 0.74 mg/dL). Liver Function Tests: Recent Labs  Lab 10/05/22 0722  AST 15  ALT 18  ALKPHOS 65  BILITOT 0.9  PROT 5.6*  ALBUMIN 2.3*   Recent Labs  Lab 10/05/22 0722  LIPASE 215*   No results for input(s): "AMMONIA" in the last 168 hours. Coagulation Profile: No results for input(s): "INR", "PROTIME" in the last 168 hours. Cardiac Enzymes: No results for input(s): "CKTOTAL", "CKMB", "CKMBINDEX", "TROPONINI" in the last 168 hours. BNP (last 3 results) No results for input(s): "PROBNP" in the last 8760 hours. HbA1C: No results for input(s): "HGBA1C" in the last 72 hours. CBG: Recent Labs  Lab 09/29/22 1719 09/29/22 2122 09/30/22 0752 09/30/22 1206 09/30/22 1722  GLUCAP 119* 152* 143* 202* 203*   Lipid Profile: No results for input(s): "CHOL", "HDL", "LDLCALC", "TRIG", "CHOLHDL", "LDLDIRECT" in the last 72 hours. Thyroid Function Tests: No results for  input(s): "TSH", "T4TOTAL", "FREET4", "T3FREE", "THYROIDAB" in the last 72 hours. Anemia Panel: No results for input(s): "VITAMINB12", "FOLATE", "FERRITIN", "TIBC", "IRON", "RETICCTPCT" in the last 72 hours. Urine analysis:    Component Value Date/Time   COLORURINE YELLOW 10/05/2022 0900   APPEARANCEUR HAZY (A) 10/05/2022 0900   LABSPEC 1.012 10/05/2022 0900   PHURINE 5.0 10/05/2022 0900   GLUCOSEU NEGATIVE 10/05/2022 0900   HGBUR NEGATIVE 10/05/2022 0900   BILIRUBINUR NEGATIVE 10/05/2022 0900   KETONESUR NEGATIVE 10/05/2022 0900   PROTEINUR NEGATIVE 10/05/2022 0900   UROBILINOGEN 0.2 04/07/2011 2356   NITRITE NEGATIVE 10/05/2022 0900   LEUKOCYTESUR NEGATIVE 10/05/2022 0900    Radiological Exams on Admission: CT ABDOMEN PELVIS W CONTRAST  Result Date: 10/05/2022 CLINICAL DATA:  Abdominal pain. History of lung cancer. * Tracking Code: BO *. EXAM: CT ABDOMEN AND PELVIS WITH CONTRAST TECHNIQUE: Multidetector CT imaging of the abdomen and pelvis was performed using the standard protocol following bolus administration of intravenous contrast. RADIATION DOSE REDUCTION: This exam was performed according to the departmental dose-optimization program which includes automated exposure control, adjustment of the mA and/or kV according to patient size and/or use of iterative reconstruction technique. CONTRAST:  OMNIPAQUE IOHEXOL 300 MG/ML  SOLN COMPARISON:  09/21/2022 FINDINGS: Lower chest: Posteromedial left lower lobe lung mass is again noted measuring 6.1 x 4.8 cm, image 14/6. On the previous exam this measured 5.4 x 4.0 cm. Adjacent pleural thickening is identified. Hepatobiliary: No suspicious liver lesion identified. Large stone within the gallbladder measures 4.6 cm. Sludge noted within the gallbladder fundus. The common bile duct measures up to 0.9 cm which when compared with the previous exam is unchanged. No signs of choledocholithiasis or obstructing mass. Pancreas: Mild soft tissue  stranding about the pancreatic parenchyma is identified, increased from 09/21/2022. Small cystic lesion within the head  of pancreas measures 9 mm and is unchanged. Arising off the anterior aspect of the distal tail of pancreas is a small fluid collection measuring 1.6 x 1.2 cm, image 22/3. Previously 1.6 x 0.8 cm. Mild increase caliber of the main duct at the level of the body and tail which appears new from the previous exam measuring up to 4 mm. Spleen: Normal in size without focal abnormality. Adrenals/Urinary Tract: Normal adrenal glands. No kidney mass or hydronephrosis. Bladder is unremarkable. Stomach/Bowel: Since the previous exam there is been interval placement of a transgastric drainage catheter into the large lesser sac pseudocyst. The pseudocyst has significantly diminished in volume compared with the previous exam. Currently this measures 4.7 x 3.7 by 7.6 cm. Formally 12.3 x 6.8 by 9.3 cm. New small fluid collection between the pancreatic body and a and stomach measures 2.3 x 1.9 cm. No pathologic dilatation of the large or small bowel loops. No bowel wall thickening or inflammation Vascular/Lymphatic: Aortic atherosclerosis. The upper abdominal vascularity including the portal vein and splenic vein remain patent. No signs of abdominopelvic adenopathy. Reproductive: Status post hysterectomy. No adnexal masses. Other: No pneumoperitoneum identified.  No significant free fluid.2 Musculoskeletal: No acute or significant osseous findings. IMPRESSION: 1. Since the previous exam there has been interval placement of a transgastric drainage catheter into the large lesser sac pseudocyst. The pseudocyst has significantly diminished in volume compared with the previous exam. 2. New small fluid collection between the pancreatic body and stomach measures 2.3 x 1.9 cm. Favor small pseudocysts. 3. Mild soft tissue stranding about the pancreatic parenchyma is identified, increased from the previous exam. Findings are  favored to represent acute pancreatitis. 4. Mild increase caliber of the main duct at the level of the body and tail of the pancreas which appears new from the previous exam measuring up to 4 mm. 5. Cholelithiasis. 6. Mild increase in size of the left lower lobe lung mass. 7.  Aortic Atherosclerosis (ICD10-I70.0). Electronically Signed   By: Signa Kell M.D.   On: 10/05/2022 10:34    EKG: Independently reviewed.  Sinus rhythm with no acute ST-T wave changes.  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 with meds with sips.  Resume necessary home medications.  Reassess tomorrow morning.  GI to see as well. her pseudocyst aspiration grew candida and viridans streptococcus. ID consulted and she was started on Micafungin and levaquin and was discharged on those as well so I will resume both of these medications.  Type 2 diabetes mellitus: Hemoglobin A1c 8.4 in June 2024.  Hold metformin.  Start on SSI.  Malignant neoplasm of lung, metastatic to brain: Continue Keppra.  She is not on prednisone anymore.  Non-small cell lung cancer: Follows with Dr. Arbutus Ped.  Stable at the moment.  Follow-up as outpatient.  History of focal seizures: Continue Keppra.  Generalized anxiety disorder: Continue as needed Klonopin.  GERD: GI recently recommended to stop PPI.  Resume famotidine.  DVT prophylaxis: enoxaparin (LOVENOX) injection 40 mg Start: 10/05/22 1215 Code Status: Full code Family Communication: None present at bedside.  Plan of care discussed with patient in length and he verbalized understanding and agreed with it. Disposition Plan: Potential discharge in 2 to  3 days Consults called: GI  Hughie Closs MD [facility]  *Please note that this is a verbal dictation therefore any spelling or grammatical errors are due to the "[employer] One" system interpretation.  Please page via Amion and do not message via secure chat for urgent patient care matters. Secure chat can be used for non urgent patient care matters. 10/05/2022, 12:21 PM  To contact the attending provider between 7A-7P or the covering provider during after hours 7P-7A, please log into the web site [email or web address]

## 2022-10-06 DIAGNOSIS — K859 Acute pancreatitis without necrosis or infection, unspecified: Secondary | ICD-10-CM | POA: Diagnosis not present

## 2022-10-06 DIAGNOSIS — K863 Pseudocyst of pancreas: Secondary | ICD-10-CM | POA: Diagnosis not present

## 2022-10-06 LAB — COMPREHENSIVE METABOLIC PANEL
ALT: 17 U/L (ref 0–44)
AST: 16 U/L (ref 15–41)
Albumin: 2.1 g/dL — ABNORMAL LOW (ref 3.5–5.0)
Alkaline Phosphatase: 58 U/L (ref 38–126)
Anion gap: 10 (ref 5–15)
BUN: 6 mg/dL — ABNORMAL LOW (ref 8–23)
CO2: 28 mmol/L (ref 22–32)
Calcium: 8.1 mg/dL — ABNORMAL LOW (ref 8.9–10.3)
Chloride: 101 mmol/L (ref 98–111)
Creatinine, Ser: 0.61 mg/dL (ref 0.44–1.00)
GFR, Estimated: >60 mL/min (ref >60)
Glucose, Bld: 132 mg/dL — ABNORMAL HIGH (ref 70–99)
Potassium: 3.4 mmol/L — ABNORMAL LOW (ref 3.5–5.1)
Sodium: 139 mmol/L (ref 135–145)
Total Bilirubin: 1 mg/dL (ref 0.3–1.2)
Total Protein: 4.9 g/dL — ABNORMAL LOW (ref 6.5–8.1)

## 2022-10-06 LAB — GLUCOSE, CAPILLARY
Glucose-Capillary: 119 mg/dL — ABNORMAL HIGH (ref 70–99)
Glucose-Capillary: 132 mg/dL — ABNORMAL HIGH (ref 70–99)
Glucose-Capillary: 139 mg/dL — ABNORMAL HIGH (ref 70–99)
Glucose-Capillary: 140 mg/dL — ABNORMAL HIGH (ref 70–99)
Glucose-Capillary: 154 mg/dL — ABNORMAL HIGH (ref 70–99)
Glucose-Capillary: 188 mg/dL — ABNORMAL HIGH (ref 70–99)

## 2022-10-06 LAB — CBC
HCT: 27.9 % — ABNORMAL LOW (ref 36.0–46.0)
Hemoglobin: 8.7 g/dL — ABNORMAL LOW (ref 12.0–15.0)
MCH: 30.3 pg (ref 26.0–34.0)
MCHC: 31.2 g/dL (ref 30.0–36.0)
MCV: 97.2 fL (ref 80.0–100.0)
Platelets: 189 10*3/uL (ref 150–400)
RBC: 2.87 MIL/uL — ABNORMAL LOW (ref 3.87–5.11)
RDW: 16.4 % — ABNORMAL HIGH (ref 11.5–15.5)
WBC: 4.2 10*3/uL (ref 4.0–10.5)
nRBC: 0 % (ref 0.0–0.2)

## 2022-10-06 LAB — MAGNESIUM: Magnesium: 1.3 mg/dL — ABNORMAL LOW (ref 1.7–2.4)

## 2022-10-06 MED ORDER — SODIUM CHLORIDE 0.9 % IV BOLUS
250.0000 mL | Freq: Once | INTRAVENOUS | Status: AC
  Administered 2022-10-06: 250 mL via INTRAVENOUS

## 2022-10-06 MED ORDER — POTASSIUM CHLORIDE 10 MEQ/100ML IV SOLN
10.0000 meq | INTRAVENOUS | Status: AC
  Administered 2022-10-06: 10 meq via INTRAVENOUS
  Filled 2022-10-06: qty 100

## 2022-10-06 MED ORDER — POTASSIUM CHLORIDE 10 MEQ/100ML IV SOLN
10.0000 meq | INTRAVENOUS | Status: AC
  Administered 2022-10-06 (×3): 10 meq via INTRAVENOUS
  Filled 2022-10-06 (×3): qty 100

## 2022-10-06 MED ORDER — FAMOTIDINE 20 MG PO TABS
20.0000 mg | ORAL_TABLET | Freq: Every day | ORAL | Status: DC
  Administered 2022-10-06 – 2022-10-08 (×3): 20 mg via ORAL
  Filled 2022-10-06 (×3): qty 1

## 2022-10-06 MED ORDER — MAGNESIUM SULFATE 4 GM/100ML IV SOLN
4.0000 g | Freq: Once | INTRAVENOUS | Status: AC
  Administered 2022-10-06: 4 g via INTRAVENOUS
  Filled 2022-10-06: qty 100

## 2022-10-06 NOTE — Plan of Care (Signed)

## 2022-10-06 NOTE — Progress Notes (Signed)
PROGRESS NOTE    Carolyn Hooper  ZOX:[id number] DOB: 1961-01-27 DOA: 10/05/2022 PCP: [name], FNP   Brief Narrative:  HPI: Carolyn Hooper is a 62 y.o. 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  Currently on immunotherapy / chemotherapy with Dr. Shirline Frees, anxiety, recent hospitalization for symptomatic pancreatic cyst, she was hospitalized from 09/21/2022 through 09/30/2022 and underwent EUS as well as stent placement for the cyst who returns to the ED with a complaint of constipation for 5 days, having no bowel movement but passing very little gas.  Also complains of intermittent generalized abdominal pain, more pronounced in the epigastric region with intermittent nausea.  No fever, chills, sweating, any problem with urination, chest pain, shortness of breath or any other complaint.   ED Course: Upon arrival to ED, she was fairly hemodynamically stable.  CT abdomen showed evidence of yet another episode of acute pancreatitis with new small fluid collection favored as pseudocyst, previous large pseudocyst appears to be diminished in size.  EDP discussed the case with GI, who indicated that patient does not need any GI intervention however they will see patient in consultation.  Hospitalist were consulted for admission afterwards.  Patient received several rounds of IV Dilaudid.  She was hypokalemic as well.  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  She is willing to try diet so I will start her on clear liquid diet.  She was seen by GI, they had opined that there is  nothing that they will do and recommended conservative management and signed off.  I will start her on clears.  During recent hospitalization, her pseudocyst aspiration grew candida and viridans streptococcus. ID consulted and she was started on Micafungin and levaquin and was discharged on those as well so I will resume both of these medications.   Type 2 diabetes mellitus: Hemoglobin A1c 8.4 in June 2024.  Hold metformin.  Continue SSI, blood sugar controlled.   Malignant neoplasm of lung, metastatic to brain: Continue Keppra.  She is not on prednisone anymore.   Non-small cell lung cancer: Follows with Dr. Arbutus Ped.  Stable at the moment.  Follow-up as outpatient.   History of focal seizures: Continue Keppra.   Generalized anxiety disorder: Continue as needed Klonopin.   GERD: GI recently recommended to stop PPI.  Resume famotidine.  DVT prophylaxis: enoxaparin (LOVENOX) injection 40 mg Start: 10/05/22 2200   Code Status: Full Code  Family Communication:  None present at bedside.  Plan of care discussed with patient in length and he/she verbalized understanding and agreed with it.  Status is: Inpatient Remains inpatient appropriate because: Still with pancreatitis, advancing diet slowly.   Estimated body mass index is 32.48 kg/m as calculated from the following:   Height as of this encounter: 5\' 8"  (1.727 m).   Weight as of this encounter: 96.9 kg.    Nutritional Assessment: Body mass index is 32.48 kg/m.Marland Kitchen Seen by dietician.  I agree with the assessment and plan as outlined below: Nutrition Status:        . Skin Assessment: I have examined the patient's skin and I agree  with the wound assessment as performed by the wound care RN as outlined below:    Consultants:  GI-signed off  Procedures:  None  Antimicrobials:  Anti-infectives (From admission, onward)    Start     Dose/Rate Route Frequency Ordered Stop   10/05/22 1800  micafungin (MYCAMINE) 100 mg in sodium  chloride 0.9 % 100 mL IVPB       Note to Pharmacy: Micafungin 100mg  IV q24h.  Last dose 10/15/2022 Labs: Labs weekly while on IV antibiotics: _x_ CBC with differential __x CMP __x CRP _x_ ESR _x_ Please leave PIC in place until doctor has seen patient or been notified   Fax weekly labs to [phone]     100 mg 105 mL/hr over 1 Hours Intravenous Every 24 hours 10/05/22 1210     10/05/22 1700  levofloxacin (LEVAQUIN) tablet 750 mg        750 mg Oral Daily 10/05/22 1210     10/05/22 1215  valACYclovir (VALTREX) tablet 500 mg        500 mg Oral 2 times daily 10/05/22 1210           Subjective: Patient seen and examined.  She is slow in response at baseline due to brain metastasis.  She states that she feels better.  No abdominal pain.  Has some nausea but wants to try clears.  Objective: Vitals:   10/05/22 1424 10/05/22 1813 10/05/22 2147 10/06/22 0418  BP: (!) 122/44 139/63 (!) 112/50 (!) 104/50  Pulse: 80 88 (!) 102 90  Resp: 18 17 16 14   Temp: 98 F (36.7 C) 97.8 F (36.6 C) 98.9 F (37.2 C) 99.2 F (37.3 C)  TempSrc: Oral  Oral Oral  SpO2: 94% 93% 95% 96%  Weight: 96.9 kg     Height: 5\' 8"  (1.727 m)       Intake/Output Summary (Last 24 hours) at 10/06/2022 1305 Last data filed at 10/06/2022 0358 Gross per 24 hour  Intake 983.26 ml  Output --  Net 983.26 ml   Filed Weights   10/05/22 1424  Weight: 96.9 kg    Examination:  General exam: Appears calm and comfortable  Respiratory system: Clear to auscultation. Respiratory effort normal. Cardiovascular system: S1 & S2 heard, RRR. No JVD, murmurs, rubs, gallops or clicks. No pedal edema. Gastrointestinal system: Abdomen is nondistended, soft but epigastric tenderness. No organomegaly or masses felt. Normal bowel sounds heard. Central nervous system: Alert and oriented. No focal neurological deficits. Extremities: Symmetric 5 x 5 power. Skin: No rashes, lesions or ulcers Psychiatry: Judgement and insight  appear normal. Mood & affect appropriate.    Data Reviewed: I have personally reviewed following labs and imaging studies  CBC: Recent Labs  Lab 10/05/22 0722 10/05/22 1249 10/06/22 0500  WBC 4.2 3.8* 4.2  HGB 9.3* 9.1* 8.7*  HCT 29.4* 29.0* 27.9*  MCV 97.0 96.7 97.2  PLT 187 192 189   Basic Metabolic Panel: Recent Labs  Lab 09/30/22 1020 10/05/22 0722 10/05/22 1249 10/06/22 0500  NA 134* 132*  --  139  K 3.5 3.3*  --  3.4*  CL 99 95*  --  101  CO2 25 26  --  28  GLUCOSE 166* 204*  --  132*  BUN 9 12  --  6*  CREATININE 0.66 0.74 0.64 0.61  CALCIUM 7.9* 8.2*  --  8.1*  MG  --   --  1.3* 1.3*   GFR: Estimated Creatinine Clearance: 88.7 mL/min (by C-G formula based  on SCr of 0.61 mg/dL). Liver Function Tests: Recent Labs  Lab 10/05/22 0722 10/06/22 0500  AST 15 16  ALT 18 17  ALKPHOS 65 58  BILITOT 0.9 1.0  PROT 5.6* 4.9*  ALBUMIN 2.3* 2.1*   Recent Labs  Lab 10/05/22 0722  LIPASE 215*   No results for input(s): "AMMONIA" in the last 168 hours. Coagulation Profile: No results for input(s): "INR", "PROTIME" in the last 168 hours. Cardiac Enzymes: No results for input(s): "CKTOTAL", "CKMB", "CKMBINDEX", "TROPONINI" in the last 168 hours. BNP (last 3 results) No results for input(s): "PROBNP" in the last 8760 hours. HbA1C: No results for input(s): "HGBA1C" in the last 72 hours. CBG: Recent Labs  Lab 10/05/22 2023 10/06/22 0003 10/06/22 0420 10/06/22 0749 10/06/22 1235  GLUCAP 137* 154* 119* 140* 139*   Lipid Profile: No results for input(s): "CHOL", "HDL", "LDLCALC", "TRIG", "CHOLHDL", "LDLDIRECT" in the last 72 hours. Thyroid Function Tests: No results for input(s): "TSH", "T4TOTAL", "FREET4", "T3FREE", "THYROIDAB" in the last 72 hours. Anemia Panel: No results for input(s): "VITAMINB12", "FOLATE", "FERRITIN", "TIBC", "IRON", "RETICCTPCT" in the last 72 hours. Sepsis Labs: No results for input(s): "PROCALCITON", "LATICACIDVEN" in the last 168  hours.  Recent Results (from the past 240 hour(s))  Body fluid culture w Gram Stain     Status: None   Collection Time: 09/26/22  4:16 PM   Specimen: PATH Cytology Cyst; Body Fluid  Result Value Ref Range Status   Specimen Description   Final    FLUID Performed at [facility], [address] [place], [place] [address]    Special Requests   Final    PANCREATIC PSEUDOCYST ASPIRATION Performed at [facility], [address] [place], [place] [address]    Gram Stain   Final    NO WBC SEEN RARE GRAM POSITIVE COCCI IN PAIRS RARE GRAM POSITIVE COCCI IN CHAINS    Culture   Final    Two isolates with different morphologies were identified as the same organism.The most resistant organism was reported. FEW VIRIDANS STREPTOCOCCUS FEW CANDIDA GLABRATA NO ANAEROBES ISOLATED Sent to [facility] for further susceptibility testing. SEE SEPARATE REPORT Performed at [facility], [address] [place], [place] [address]    Report Status 10/04/2022 FINAL  Final   Organism ID, Bacteria VIRIDANS STREPTOCOCCUS  Final      Susceptibility   Viridans streptococcus - MIC*    PENICILLIN 1 INTERMEDIATE Intermediate     CEFTRIAXONE 1 SENSITIVE Sensitive     ERYTHROMYCIN 2 RESISTANT Resistant     LEVOFLOXACIN 1 SENSITIVE Sensitive     VANCOMYCIN 1 SENSITIVE Sensitive     * FEW VIRIDANS STREPTOCOCCUS     Radiology Studies: CT ABDOMEN PELVIS W CONTRAST  Result Date: 10/05/2022 CLINICAL DATA:  Abdominal pain. History of lung cancer. * Tracking Code: BO *. EXAM: CT ABDOMEN AND PELVIS WITH CONTRAST TECHNIQUE: Multidetector CT imaging of the abdomen and pelvis was performed using the standard protocol following bolus administration of intravenous contrast. RADIATION DOSE REDUCTION: This exam was performed according to the departmental dose-optimization program which includes automated exposure control, adjustment of the mA and/or kV according to patient size and/or use of  iterative reconstruction technique. CONTRAST:  OMNIPAQUE IOHEXOL 300 MG/ML  SOLN COMPARISON:  09/21/2022 FINDINGS: Lower chest: Posteromedial left lower lobe lung mass is again noted measuring 6.1 x 4.8 cm, image 14/6. On the previous exam this measured 5.4 x 4.0 cm. Adjacent pleural thickening is identified. Hepatobiliary: No  suspicious liver lesion identified. Large stone within the gallbladder measures 4.6 cm. Sludge noted within the gallbladder fundus. The common bile duct measures up to 0.9 cm which when compared with the previous exam is unchanged. No signs of choledocholithiasis or obstructing mass. Pancreas: Mild soft tissue stranding about the pancreatic parenchyma is identified, increased from 09/21/2022. Small cystic lesion within the head of pancreas measures 9 mm and is unchanged. Arising off the anterior aspect of the distal tail of pancreas is a small fluid collection measuring 1.6 x 1.2 cm, image 22/3. Previously 1.6 x 0.8 cm. Mild increase caliber of the main duct at the level of the body and tail which appears new from the previous exam measuring up to 4 mm. Spleen: Normal in size without focal abnormality. Adrenals/Urinary Tract: Normal adrenal glands. No kidney mass or hydronephrosis. Bladder is unremarkable. Stomach/Bowel: Since the previous exam there is been interval placement of a transgastric drainage catheter into the large lesser sac pseudocyst. The pseudocyst has significantly diminished in volume compared with the previous exam. Currently this measures 4.7 x 3.7 by 7.6 cm. Formally 12.3 x 6.8 by 9.3 cm. New small fluid collection between the pancreatic body and a and stomach measures 2.3 x 1.9 cm. No pathologic dilatation of the large or small bowel loops. No bowel wall thickening or inflammation Vascular/Lymphatic: Aortic atherosclerosis. The upper abdominal vascularity including the portal vein and splenic vein remain patent. No signs of abdominopelvic adenopathy. Reproductive:  Status post hysterectomy. No adnexal masses. Other: No pneumoperitoneum identified.  No significant free fluid.2 Musculoskeletal: No acute or significant osseous findings. IMPRESSION: 1. Since the previous exam there has been interval placement of a transgastric drainage catheter into the large lesser sac pseudocyst. The pseudocyst has significantly diminished in volume compared with the previous exam. 2. New small fluid collection between the pancreatic body and stomach measures 2.3 x 1.9 cm. Favor small pseudocysts. 3. Mild soft tissue stranding about the pancreatic parenchyma is identified, increased from the previous exam. Findings are favored to represent acute pancreatitis. 4. Mild increase caliber of the main duct at the level of the body and tail of the pancreas which appears new from the previous exam measuring up to 4 mm. 5. Cholelithiasis. 6. Mild increase in size of the left lower lobe lung mass. 7.  Aortic Atherosclerosis (ICD10-I70.0). Electronically Signed   By: Signa Kell M.D.   On: 10/05/2022 10:34    Scheduled Meds:  Chlorhexidine Gluconate Cloth  6 each Topical Daily   clonazePAM  0.5 mg Oral BID   enoxaparin (LOVENOX) injection  40 mg Subcutaneous Q24H   folic acid  1 mg Oral Daily   insulin aspart  0-15 Units Subcutaneous Q4H   levETIRAcetam  1,000 mg Oral BID   levofloxacin  750 mg Oral Daily   lipase/protease/amylase  72,000 Units Oral TID WC   valACYclovir  500 mg Oral BID   Continuous Infusions:  0.9 % NaCl with KCl 20 mEq / L 75 mL/hr at 10/06/22 0837   magnesium sulfate bolus IVPB     micafungin (MYCAMINE) 100 mg in sodium chloride 0.9 % 100 mL IVPB Stopped (10/05/22 2059)   potassium chloride 10 mEq (10/06/22 1259)     LOS: 1 day   Hughie Closs, MD [facility]  10/06/2022, 1:05 PM   *Please note that this is a verbal dictation therefore any spelling or grammatical errors are due to the "[employer]agon [employer] One" system interpretation.  Please page via  Amion and do not  message via secure chat for urgent patient care matters. Secure chat can be used for non urgent patient care matters.  How to contact the [facility] Attending or Consulting provider 7A - 7P or covering provider during after hours 7P -7A, for this patient?  Check the care team in [facility] and look for a) attending/consulting [facility] provider listed and b) the [facility] team listed. Page or secure chat 7A-7P. Log into [email or web address] and use [facility] universal password to access. If you do not have the password, please contact the hospital operator. Locate the [facility] provider you are looking for under [facility] and page to a number that you can be directly reached. If you still have difficulty reaching the provider, please page the [facility] (Director on Call) for the Hospitalists listed on amion for assistance.

## 2022-10-07 ENCOUNTER — Other Ambulatory Visit: Payer: Self-pay

## 2022-10-07 DIAGNOSIS — K863 Pseudocyst of pancreas: Secondary | ICD-10-CM | POA: Diagnosis not present

## 2022-10-07 DIAGNOSIS — K859 Acute pancreatitis without necrosis or infection, unspecified: Secondary | ICD-10-CM | POA: Diagnosis not present

## 2022-10-07 LAB — COMPREHENSIVE METABOLIC PANEL
ALT: 18 U/L (ref 0–44)
AST: 17 U/L (ref 15–41)
Albumin: 2 g/dL — ABNORMAL LOW (ref 3.5–5.0)
Alkaline Phosphatase: 56 U/L (ref 38–126)
Anion gap: 7 (ref 5–15)
BUN: <5 mg/dL — ABNORMAL LOW (ref 8–23)
CO2: 26 mmol/L (ref 22–32)
Calcium: 7.7 mg/dL — ABNORMAL LOW (ref 8.9–10.3)
Chloride: 104 mmol/L (ref 98–111)
Creatinine, Ser: 0.59 mg/dL (ref 0.44–1.00)
GFR, Estimated: >60 mL/min (ref >60)
Glucose, Bld: 119 mg/dL — ABNORMAL HIGH (ref 70–99)
Potassium: 3.6 mmol/L (ref 3.5–5.1)
Sodium: 137 mmol/L (ref 135–145)
Total Bilirubin: 0.8 mg/dL (ref 0.3–1.2)
Total Protein: 4.8 g/dL — ABNORMAL LOW (ref 6.5–8.1)

## 2022-10-07 LAB — CBC WITH DIFFERENTIAL/PLATELET
Abs Immature Granulocytes: 0.06 10*3/uL (ref 0.00–0.07)
Basophils Absolute: 0 10*3/uL (ref 0.0–0.1)
Basophils Relative: 1 %
Eosinophils Absolute: 0.1 10*3/uL (ref 0.0–0.5)
Eosinophils Relative: 3 %
HCT: 27 % — ABNORMAL LOW (ref 36.0–46.0)
Hemoglobin: 8.5 g/dL — ABNORMAL LOW (ref 12.0–15.0)
Immature Granulocytes: 2 %
Lymphocytes Relative: 32 %
Lymphs Abs: 1 10*3/uL (ref 0.7–4.0)
MCH: 31 pg (ref 26.0–34.0)
MCHC: 31.5 g/dL (ref 30.0–36.0)
MCV: 98.5 fL (ref 80.0–100.0)
Monocytes Absolute: 0.5 10*3/uL (ref 0.1–1.0)
Monocytes Relative: 15 %
Neutro Abs: 1.6 10*3/uL — ABNORMAL LOW (ref 1.7–7.7)
Neutrophils Relative %: 47 %
Platelets: 194 10*3/uL (ref 150–400)
RBC: 2.74 MIL/uL — ABNORMAL LOW (ref 3.87–5.11)
RDW: 16.4 % — ABNORMAL HIGH (ref 11.5–15.5)
WBC: 3.3 10*3/uL — ABNORMAL LOW (ref 4.0–10.5)
nRBC: 0 % (ref 0.0–0.2)

## 2022-10-07 LAB — GLUCOSE, CAPILLARY
Glucose-Capillary: 109 mg/dL — ABNORMAL HIGH (ref 70–99)
Glucose-Capillary: 132 mg/dL — ABNORMAL HIGH (ref 70–99)
Glucose-Capillary: 136 mg/dL — ABNORMAL HIGH (ref 70–99)
Glucose-Capillary: 138 mg/dL — ABNORMAL HIGH (ref 70–99)
Glucose-Capillary: 147 mg/dL — ABNORMAL HIGH (ref 70–99)
Glucose-Capillary: 63 mg/dL — ABNORMAL LOW (ref 70–99)
Glucose-Capillary: 97 mg/dL (ref 70–99)

## 2022-10-07 LAB — MAGNESIUM: Magnesium: 2.2 mg/dL (ref 1.7–2.4)

## 2022-10-07 MED ORDER — ALUM & MAG HYDROXIDE-SIMETH 200-200-20 MG/5ML PO SUSP
15.0000 mL | Freq: Four times a day (QID) | ORAL | Status: DC | PRN
  Administered 2022-10-07: 15 mL via ORAL
  Filled 2022-10-07: qty 30

## 2022-10-07 MED ORDER — POLYETHYLENE GLYCOL 3350 17 G PO PACK
17.0000 g | PACK | Freq: Every day | ORAL | Status: DC | PRN
  Administered 2022-10-07: 17 g via ORAL
  Filled 2022-10-07: qty 1

## 2022-10-07 NOTE — Progress Notes (Signed)
PROGRESS NOTE    Carolyn Hooper  HQI:[id number] DOB: 05-11-1960 DOA: 10/05/2022 PCP: [name], FNP   Brief Narrative:  HPI: Carolyn Hooper is a 62 y.o. 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  Currently on immunotherapy / chemotherapy with Dr. Shirline Frees, anxiety, recent hospitalization for symptomatic pancreatic cyst, she was hospitalized from 09/21/2022 through 09/30/2022 and underwent EUS as well as stent placement for the cyst who returns to the ED with a complaint of constipation for 5 days, having no bowel movement but passing very little gas.  Also complains of intermittent generalized abdominal pain, more pronounced in the epigastric region with intermittent nausea.  No fever, chills, sweating, any problem with urination, chest pain, shortness of breath or any other complaint.   ED Course: Upon arrival to ED, she was fairly hemodynamically stable.  CT abdomen showed evidence of yet another episode of acute pancreatitis with new small fluid collection favored as pseudocyst, previous large pseudocyst appears to be diminished in size.  EDP discussed the case with GI, who indicated that patient does not need any GI intervention however they will see patient in consultation.  Hospitalist were consulted for admission afterwards.  Patient received several rounds of IV Dilaudid.  She was hypokalemic as well.  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  She is willing to try diet so I will start her on clear liquid diet.  She was seen by GI, they had opined that there is  nothing that they will do and recommended conservative management and signed off.  She is tolerating clears, pain is improving, only 4 out of 10.  No nausea.  However she is afraid and apprehensive of advancing diet.  Lengthy conversation and counseling with the patient.  She is agreeable with trying full liquid diet now and soft for the dinner today.  Of note, During recent hospitalization, her pseudocyst aspiration grew candida and viridans streptococcus. ID consulted and she was started on Micafungin and levaquin and was discharged on those and we have resumed those.   Type 2 diabetes mellitus: Hemoglobin A1c 8.4 in June 2024.  Hold metformin.  Continue SSI, blood sugar controlled.   Malignant neoplasm of lung, metastatic to brain: Continue Keppra.  She is not on prednisone anymore.   Non-small cell lung cancer: Follows with Dr. Arbutus Ped.  Stable at the moment.  Follow-up as outpatient.   History of focal seizures: Continue Keppra.   Generalized anxiety disorder: Continue as needed Klonopin.   GERD: GI recently recommended to stop PPI.  Resume famotidine.  DVT prophylaxis: enoxaparin (LOVENOX) injection 40 mg Start: 10/05/22 2200   Code Status: Full Code  Family Communication:  None present at bedside.  Plan of care discussed with patient in length and he/she verbalized understanding and agreed with it.  Status is: Inpatient Remains inpatient appropriate because: Still with pancreatitis, advancing diet slowly.  Potential discharge tomorrow.   Estimated body mass index is 32.48 kg/m as calculated from the following:   Height as of this encounter: 5\' 8"  (1.727 m).   Weight as of this encounter: 96.9 kg.  Nutritional Assessment: Body mass index is 32.48 kg/m.Marland Kitchen Seen by dietician.  I agree with the assessment and plan as outlined below: Nutrition Status:        . Skin Assessment: I have examined the patient's skin and I agree with the wound assessment as performed by the wound  care RN as outlined below:    Consultants:  GI-signed off  Procedures:  None  Antimicrobials:  Anti-infectives (From admission, onward)    Start     Dose/Rate Route Frequency Ordered Stop   10/05/22 1800  micafungin (MYCAMINE) 100 mg in sodium chloride 0.9 % 100 mL IVPB       Note to Pharmacy: Micafungin 100mg  IV q24h.  Last dose 10/15/2022 Labs: Labs weekly while on IV antibiotics: _x_ CBC with differential __x CMP __x CRP _x_ ESR _x_ Please leave PIC in place until doctor has seen patient or been notified   Fax weekly labs to [phone]     100 mg 105 mL/hr over 1 Hours Intravenous Every 24 hours 10/05/22 1210     10/05/22 1700  levofloxacin (LEVAQUIN) tablet 750 mg        750 mg Oral Daily 10/05/22 1210     10/05/22 1215  valACYclovir (VALTREX) tablet 500 mg        500 mg Oral 2 times daily 10/05/22 1210           Subjective: Patient seen and examined.  She says that her pain is improving.  She is tolerating clear liquid diet but she is little apprehensive about advancing the diet due to fear of the pain.  She is however willing to try.  No other complaint.  Objective: Vitals:   10/06/22 1344 10/06/22 2006 10/07/22 0010 10/07/22 0528  BP: (!) 93/58 (!) 87/50 (!) 100/49 (!) 107/52  Pulse: 89 80 74 73  Resp: 15 16 16 16   Temp: 97.8 F (36.6 C) 97.9 F (36.6 C) 98.5 F (36.9 C) 98.4 F (36.9 C)  TempSrc: Oral Oral Oral Oral  SpO2: 97% 99% 100% 93%  Weight:      Height:       No intake or output data in the 24 hours ending 10/07/22 1022  Filed Weights   10/05/22 1424  Weight: 96.9 kg    Examination:  General exam: Appears calm and comfortable  Respiratory system: Clear to auscultation. Respiratory effort normal. Cardiovascular system: S1 & S2 heard, RRR. No JVD, murmurs, rubs, gallops or clicks. No pedal edema. Gastrointestinal system: Abdomen is nondistended, soft and very mild epigastric tenderness. No organomegaly or masses felt. Normal bowel  sounds heard. Central nervous system: Alert and oriented. No focal neurological deficits. Extremities: Symmetric 5 x 5 power. Skin: No rashes, lesions or ulcers.  Psychiatry: Judgement and insight appear normal. Mood & affect appropriate.   Data Reviewed: I have personally reviewed following labs and imaging studies  CBC: Recent Labs  Lab 10/05/22 0722 10/05/22 1249 10/06/22 0500 10/07/22 0425  WBC 4.2 3.8* 4.2 3.3*  NEUTROABS  --   --   --  1.6*  HGB 9.3* 9.1* 8.7* 8.5*  HCT 29.4* 29.0* 27.9* 27.0*  MCV 97.0 96.7 97.2 98.5  PLT 187 192 189 194   Basic Metabolic Panel: Recent Labs  Lab 10/05/22 0722 10/05/22 1249 10/06/22 0500 10/07/22 0425  NA 132*  --  139 137  K 3.3*  --  3.4* 3.6  CL 95*  --  101 104  CO2 26  --  28 26  GLUCOSE  204*  --  132* 119*  BUN 12  --  6* <5*  CREATININE 0.74 0.64 0.61 0.59  CALCIUM 8.2*  --  8.1* 7.7*  MG  --  1.3* 1.3* 2.2   GFR: Estimated Creatinine Clearance: 88.7 mL/min (by C-G formula based on SCr of 0.59 mg/dL). Liver Function Tests: Recent Labs  Lab 10/05/22 0722 10/06/22 0500 10/07/22 0425  AST 15 16 17   ALT 18 17 18   ALKPHOS 65 58 56  BILITOT 0.9 1.0 0.8  PROT 5.6* 4.9* 4.8*  ALBUMIN 2.3* 2.1* 2.0*   Recent Labs  Lab 10/05/22 0722  LIPASE 215*   No results for input(s): "AMMONIA" in the last 168 hours. Coagulation Profile: No results for input(s): "INR", "PROTIME" in the last 168 hours. Cardiac Enzymes: No results for input(s): "CKTOTAL", "CKMB", "CKMBINDEX", "TROPONINI" in the last 168 hours. BNP (last 3 results) No results for input(s): "PROBNP" in the last 8760 hours. HbA1C: No results for input(s): "HGBA1C" in the last 72 hours. CBG: Recent Labs  Lab 10/06/22 2003 10/07/22 0011 10/07/22 0405 10/07/22 0524 10/07/22 0738  GLUCAP 188* 97 63* 138* 132*   Lipid Profile: No results for input(s): "CHOL", "HDL", "LDLCALC", "TRIG", "CHOLHDL", "LDLDIRECT" in the last 72 hours. Thyroid Function Tests: No  results for input(s): "TSH", "T4TOTAL", "FREET4", "T3FREE", "THYROIDAB" in the last 72 hours. Anemia Panel: No results for input(s): "VITAMINB12", "FOLATE", "FERRITIN", "TIBC", "IRON", "RETICCTPCT" in the last 72 hours. Sepsis Labs: No results for input(s): "PROCALCITON", "LATICACIDVEN" in the last 168 hours.  No results found for this or any previous visit (from the past 240 hour(s)).    Radiology Studies: No results found.  Scheduled Meds:  Chlorhexidine Gluconate Cloth  6 each Topical Daily   clonazePAM  0.5 mg Oral BID   enoxaparin (LOVENOX) injection  40 mg Subcutaneous Q24H   famotidine  20 mg Oral Daily   folic acid  1 mg Oral Daily   insulin aspart  0-15 Units Subcutaneous Q4H   levETIRAcetam  1,000 mg Oral BID   levofloxacin  750 mg Oral Daily   lipase/protease/amylase  72,000 Units Oral TID WC   valACYclovir  500 mg Oral BID   Continuous Infusions:  0.9 % NaCl with KCl 20 mEq / L 75 mL/hr at 10/07/22 0730   micafungin (MYCAMINE) 100 mg in sodium chloride 0.9 % 100 mL IVPB 100 mg (10/06/22 2151)     LOS: 2 days   Hughie Closs, MD [facility]  10/07/2022, 10:22 AM   *Please note that this is a verbal dictation therefore any spelling or grammatical errors are due to the "Dragon Medical One" system interpretation.  Please page via Amion and do not message via secure chat for urgent patient care matters. Secure chat can be used for non urgent patient care matters.  How to contact the [facility] Attending or Consulting provider 7A - 7P or covering provider during after hours 7P -7A, for this patient?  Check the care team in [facility] and look for a) attending/consulting TRH provider listed and b) the [facility] team listed. Page or secure chat 7A-7P. Log into [email or web address] and use [place]'s universal password to access. If you do not have the password, please contact the hospital operator. Locate the [facility] provider you are looking for under [facility] and page to a number  that you can be directly reached. If you still have difficulty reaching the provider, please page the [facility] (Director on Call) for the Hospitalists listed on amion for assistance.

## 2022-10-08 ENCOUNTER — Other Ambulatory Visit (HOSPITAL_COMMUNITY): Payer: Self-pay

## 2022-10-08 ENCOUNTER — Other Ambulatory Visit: Payer: Self-pay | Admitting: Medical Oncology

## 2022-10-08 ENCOUNTER — Telehealth: Payer: Self-pay | Admitting: Medical Oncology

## 2022-10-08 DIAGNOSIS — K859 Acute pancreatitis without necrosis or infection, unspecified: Secondary | ICD-10-CM | POA: Diagnosis not present

## 2022-10-08 LAB — GLUCOSE, CAPILLARY
Glucose-Capillary: 113 mg/dL — ABNORMAL HIGH (ref 70–99)
Glucose-Capillary: 119 mg/dL — ABNORMAL HIGH (ref 70–99)
Glucose-Capillary: 125 mg/dL — ABNORMAL HIGH (ref 70–99)
Glucose-Capillary: 97 mg/dL (ref 70–99)

## 2022-10-08 MED ORDER — FLUCONAZOLE 200 MG PO TABS
800.0000 mg | ORAL_TABLET | Freq: Every day | ORAL | 0 refills | Status: DC
  Filled 2022-10-08: qty 28, 7d supply, fill #0

## 2022-10-08 MED ORDER — HEPARIN SOD (PORK) LOCK FLUSH 100 UNIT/ML IV SOLN
500.0000 [IU] | INTRAVENOUS | Status: DC | PRN
  Filled 2022-10-08: qty 5

## 2022-10-08 MED ORDER — MELATONIN 5 MG PO TABS: 5.0000 mg | ORAL_TABLET | Freq: Once | ORAL | Status: DC

## 2022-10-08 MED ORDER — FLUCONAZOLE 100 MG PO TABS
800.0000 mg | ORAL_TABLET | Freq: Every day | ORAL | Status: DC
  Administered 2022-10-08: 800 mg via ORAL
  Filled 2022-10-08: qty 8

## 2022-10-08 NOTE — Progress Notes (Signed)
OT Cancellation Note  Patient Details Name: Carolyn Hooper MRN: [id number] DOB: [birth year]   Cancelled Treatment:    Reason Eval/Treat Not Completed: Fatigue/lethargy limiting ability to participate Patient reported increased fatigue and frustrations with word finding difficulties asking therapy to check back another day. OT to continue to follow and check back on 7/24.  Rosalio Loud, MS Acute Rehabilitation Department Office# [phone]  10/08/2022, 1:03 PM

## 2022-10-08 NOTE — TOC Initial Note (Signed)
Transition of Care [facility]) - Initial/Assessment Note    Patient Details  Name: Carolyn Hooper MRN: [id number] Date of Birth: 1960/08/18  Transition of Care [facility]) CM/SW Contact:    Harriett Sine, RN Phone Number: 10/08/2022, 4:59 PM  Clinical Narrative:                 Spoke with pt at bedside, wants to speak later. No TOC needs at this time, will follow.        Patient Goals and CMS Choice            Expected Discharge Plan and Services         Expected Discharge Date: 10/08/22                                    Prior Living Arrangements/Services                       Activities of Daily Living Home Assistive Devices/Equipment: Gilmer Mor (specify quad or straight), Walker (specify type), CBG Meter (front wheel roller, straight cane) ADL Screening (condition at time of admission) Patient's cognitive ability adequate to safely complete daily activities?: Yes Is the patient deaf or have difficulty hearing?: Yes Does the patient have difficulty seeing, even when wearing glasses/contacts?: No Does the patient have difficulty concentrating, remembering, or making decisions?: Yes Patient able to express need for assistance with ADLs?: Yes Does the patient have difficulty dressing or bathing?: Yes Independently performs ADLs?: Yes (appropriate for developmental age) Does the patient have difficulty walking or climbing stairs?: Yes Weakness of Legs: Both Weakness of Arms/Hands: None  Permission Sought/Granted                  Emotional Assessment              Admission diagnosis:  Acute pancreatitis [K85.90] Pancreatic cyst [K86.2] Intractable pain [R52] Acute pancreatitis, unspecified complication status, unspecified pancreatitis type [K85.90] Patient Active Problem List   Diagnosis Date Noted   Intractable pain 09/22/2022   Abdominal pain 09/21/2022   GERD (gastroesophageal reflux disease) 09/21/2022   Elevated blood pressure reading  09/21/2022   Obesity (BMI 30-39.9) 09/10/2022   Acute pancreatitis 09/04/2022   Pyuria 09/04/2022   Cholecystitis, acute 07/27/2022   Fever, unknown origin 07/27/2022   Lower abdominal pain 07/27/2022   Diarrhea 07/27/2022   Fever 07/26/2022   Cholelithiasis 07/16/2022   Primary insomnia 07/12/2022   Swelling 06/20/2022   Encounter for antineoplastic chemotherapy 06/05/2022   Encounter for antineoplastic immunotherapy 06/05/2022   Port-A-Cath in place 04/24/2022   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) 03/07/2022   Focal seizure (HCC) 03/07/2022   Non-small cell lung cancer (HCC) 02/25/2022   Goals of care, counseling/discussion 02/25/2022   Lung mass 02/13/2022   Subarachnoid hemorrhage (HCC) 02/12/2022   Aphasia 02/12/2022   Brain mass 02/12/2022   Leukocytosis 02/12/2022   Mass of lower lobe of left lung 02/12/2022   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   Refusal of statin medication by patient 07/10/2020   Myalgia due to statin 07/10/2020   Controlled substance agreement signed 05/28/2018   Benzodiazepine dependence (HCC) 05/28/2018   Hyperlipidemia associated with type 2 diabetes mellitus (HCC) 04/11/2016   Morbid obesity (HCC) 04/11/2016   Former smoker 04/11/2016   Hidradenitis suppurativa 04/11/2016   GAD (generalized anxiety disorder) 08/19/2013   Type 2 diabetes mellitus (HCC) 08/19/2013  Hyperinsulinemia 08/19/2013   PCP:  [name], FNP Pharmacy:   [facility] [address] [place] [address] Phone: [phone] Fax: [phone]  [facility] [address] [place] - [address] [place] [address] [place] [address] [place] [address] Phone: [phone] Fax: [phone]     Social Determinants of Health (SDOH) Social History: SDOH Screenings   Food Insecurity: No Food Insecurity (10/05/2022)  Housing: Low Risk  (10/05/2022)  Transportation Needs: No Transportation Needs (10/05/2022)   Utilities: Not At Risk (10/05/2022)  Depression (PHQ2-9): Medium Risk (07/12/2022)  Tobacco Use: Medium Risk (10/05/2022)   SDOH Interventions:     Readmission Risk Interventions    10/08/2022    4:55 PM 09/24/2022    3:55 PM 09/07/2022    2:44 PM  Readmission Risk Prevention Plan  Transportation Screening Complete Complete Complete  Medication Review Oceanographer) Complete Complete Complete  PCP or Specialist appointment within 3-5 days of discharge Complete Complete Complete  HRI or Home Care Consult Complete Complete Complete  SW Recovery Care/Counseling Consult Complete Complete Complete  Palliative Care Screening Not Applicable Not Applicable Not Applicable  Skilled Nursing Facility Not Applicable Not Applicable Not Applicable

## 2022-10-08 NOTE — Telephone Encounter (Signed)
"  The PT  basically told me that I was dying. I am scared ." Per Dr. Arbutus Ped , I told her there is no note indicating she is dying. She needs to keep her appts on aug 1st.   She is scheduled for discharge today.  Speech Aerionna thinks her speech is getting worse . She is hesitating and trying to find her words during on phone call.

## 2022-10-08 NOTE — Discharge Summary (Addendum)
Physician Discharge Summary  Carolyn Hooper ZOX:[id number] DOB: [birth year] DOA: 10/05/2022  PCP: [name], FNP  Admit date: 10/05/2022 Discharge date: 10/08/2022 30 Day Unplanned Readmission Risk Score    Flowsheet Row ED to Hosp-Admission (Current) from 10/05/2022 in [facility]  30 Day Unplanned Readmission Risk Score (%) 48.49 Filed at 10/08/2022 0801       This score is the patient's risk of an unplanned readmission within 30 days of being discharged (0 -100%). The score is based on dignosis, age, lab data, medications, orders, and past utilization.   Low:  0-14.9   Medium: 15-21.9   High: 22-29.9   Extreme: 30 and above          Admitted From: Home Disposition: Home  Recommendations for Outpatient Follow-up:  Follow up with PCP in 1-2 weeks Please obtain BMP/CBC in one week Please follow up with your PCP on the following pending results: Unresulted Labs (From admission, onward)     Start     Ordered   10/12/22 0500  Creatinine, serum  (enoxaparin (LOVENOX)    CrCl >/= 30 ml/min)  Weekly,   R     Comments: while on enoxaparin therapy    10/05/22 1210              Home Health: Yes Equipment/Devices: None  Discharge Condition: Stable CODE STATUS: Full code Diet recommendation: Cardiac  Subjective: Seen and Carolyn Hooper feels well.  No nausea or abdominal pain.  Tolerating soft diet.  Agreeable with the discharge plan today.  Brief/Interim Summary:  Carolyn Hooper is a 62 y.o. 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  Currently on immunotherapy / chemotherapy with Dr. Shirline Frees, anxiety, recent hospitalization for symptomatic pancreatic cyst, she was hospitalized from 09/21/2022 through 09/30/2022 and underwent EUS as well as stent placement for the cyst who returned to the ED with a complaint of constipation for 5 days, having no bowel movement but passing very little gas.  Also complains of  intermittent generalized abdominal pain, more pronounced in the epigastric region with intermittent nausea.  No fever, chills, sweating, any problem with urination, chest pain, shortness of breath or any other complaint.  She was hemodynamically stable in the ED, CT abdomen showed evidence of yet another episode of acute pancreatitis with new small fluid collection favored as pseudocyst, previous large pseudocyst appears to be diminished in size.  She was admitted under hospitalist service and managed conservatively.  Seen by GI, they also recommended conservative management and no other intervention was recommended.  They signed off.  Patient was introduced liquid diet and advance to soft diet which she has tolerated.  She is pain-free and stable and she is going to be discharged home today.     Of note, During recent hospitalization, her pseudocyst aspiration grew candida and viridans streptococcus. ID consulted and she was started on Micafungin and levaquin and was discharged on those and we resumed dose and she will continue those at home.   Type 2 diabetes mellitus: Hemoglobin A1c 8.4 in June 2024.  Resume home medication since.   Malignant neoplasm of lung, metastatic to brain: Continue Keppra.  She is not on prednisone anymore.   Non-small cell lung cancer: Follows with Dr. Arbutus Ped.  Stable at the moment.  Follow-up as outpatient.   History of focal seizures: Continue Keppra.   Generalized anxiety disorder: Continue as needed Klonopin.   GERD: GI recently recommended to  stop PPI.  Discharge plan was discussed with patient and/or family member and they verbalized understanding and agreed with it.  Discharge Diagnoses:  Principal Problem:   Acute pancreatitis Active Problems:   GAD (generalized anxiety disorder)   GERD (gastroesophageal reflux disease)   Hyperlipidemia associated with type 2 diabetes mellitus (HCC)   Morbid obesity (HCC)   Brain mass    Discharge  Instructions   Allergies as of 10/08/2022       Reactions   Adhesive [tape] Dermatitis   Statins Itching, Other (See Comments)   Myalgias, also        Medication List     STOP taking these medications    micafungin in sodium chloride 0.9 % 100 mL       TAKE these medications    albuterol 108 (90 Base) MCG/ACT inhaler Commonly known as: VENTOLIN HFA Inhale 2 puffs into the lungs every 6 (six) hours as needed.   clonazePAM 0.5 MG tablet Commonly known as: KLONOPIN Take 1 tablet (0.5 mg total) by mouth 2 (two) times daily. What changed:  when to take this reasons to take this   Creon 36000 UNITS Cpep capsule Generic drug: lipase/protease/amylase Take 36,000-72,000 Units by mouth as directed. Take 2 Capsules by mouth (72,000 units) with meals and Take 1 Capsule by mouth (36,000 units) with snacks.   Ensure Max Protein Liqd Take 330 mLs (11 oz total) by mouth 2 (two) times daily.   famotidine 20 MG tablet Commonly known as: PEPCID Take 1 tablet (20 mg total) by mouth 2 (two) times daily.   fluconazole 150 MG tablet Commonly known as: DIFLUCAN Take 1 tablet by mouth every 3 days as needed (yeast infection). What changed: Another medication with the same name was added. Make sure you understand how and when to take each.   fluconazole 200 MG tablet Commonly known as: DIFLUCAN Take 4 tablets (800 mg total) by mouth daily for 7 days. Start taking on: October 09, 2022 What changed: You were already taking a medication with the same name, and this prescription was added. Make sure you understand how and when to take each.   folic acid 1 MG tablet Commonly known as: FOLVITE Take 1 tablet by mouth once daily   furosemide 20 MG tablet Commonly known as: LASIX Take 1 tablet (20 mg total) by mouth daily as needed for fluid or edema.   hydrOXYzine 10 MG tablet Commonly known as: ATARAX Take 1 tablet (10 mg) by mouth 3 times daily as needed for anxiety.   Lantus  SoloStar 100 UNIT/ML Solostar Pen Generic drug: insulin glargine Inject 15 Units into the skin daily.   levETIRAcetam 1000 MG tablet Commonly known as: KEPPRA Take 1 tablet (1,000 mg total) by mouth 2 (two) times daily.   levofloxacin 750 MG tablet Commonly known as: LEVAQUIN Take 1 tablet (750 mg total) by mouth daily.   lidocaine 5 % ointment Commonly known as: XYLOCAINE Apply topically as needed. What changed: reasons to take this   lidocaine-prilocaine cream Commonly known as: EMLA Apply to port prior to access What changed:  when to take this reasons to take this   metFORMIN 1000 MG tablet Commonly known as: GLUCOPHAGE Take 1 tablet (1,000 mg total) by mouth 2 (two) times daily.   Mylanta Maximum Strength 400-400-40 MG/5ML suspension Generic drug: alum & mag hydroxide-simeth Take 15 mLs by mouth 2 (two) times daily.   naloxone 4 MG/0.1ML Liqd nasal spray kit Commonly known as: NARCAN Place 1  spray into the nose daily as needed (For overdose).   ondansetron 4 MG tablet Commonly known as: ZOFRAN Take 1 tablet (4 mg total) by mouth every 6 (six) hours as needed for nausea. What changed: how much to take   oxyCODONE 5 MG immediate release tablet Commonly known as: Oxy IR/ROXICODONE Take 1 tablet (5 mg total) by mouth every 8 (eight) hours as needed for moderate pain or severe pain. What changed: Another medication with the same name was removed. Continue taking this medication, and follow the directions you see here.   pantoprazole 40 MG tablet Commonly known as: PROTONIX Take 1 tablet (40 mg total) by mouth daily.   polyethylene glycol powder 17 GM/SCOOP powder Commonly known as: GLYCOLAX/MIRALAX Take 17 g by mouth daily as needed for mild constipation.   prochlorperazine 10 MG tablet Commonly known as: COMPAZINE Take 1 tablet (10 mg total) by mouth every 6 (six) hours as needed. What changed: reasons to take this   TechLite Pen Needles 32G X 4 MM  Misc Generic drug: Insulin Pen Needle Use to inject insulin daily as directed.   triamcinolone ointment 0.5 % Commonly known as: KENALOG Apply topically to affected area twice daily as directed, What changed:  when to take this reasons to take this   TYLENOL 500 MG tablet Generic drug: acetaminophen Take 1,000 mg by mouth every 6 (six) hours as needed for mild pain or headache.   valACYclovir 500 MG tablet Commonly known as: Valtrex Take 1 tablet (500 mg) by mouth 2 times daily. What changed:  when to take this reasons to take this         Follow-up Information     [name], FNP Follow up in 1 week(s).   Specialty: Family Medicine Contact information: [address] [place] [address] [phone]                Allergies  Allergen Reactions   Adhesive [Tape] Dermatitis   Statins Itching and Other (See Comments)    Myalgias, also    Consultations: GI   Procedures/Studies: CT ABDOMEN PELVIS W CONTRAST  Result Date: 10/05/2022 CLINICAL DATA:  Abdominal pain. History of lung cancer. * Tracking Code: BO *. EXAM: CT ABDOMEN AND PELVIS WITH CONTRAST TECHNIQUE: Multidetector CT imaging of the abdomen and pelvis was performed using the standard protocol following bolus administration of intravenous contrast. RADIATION DOSE REDUCTION: This exam was performed according to the departmental dose-optimization program which includes automated exposure control, adjustment of the mA and/or kV according to patient size and/or use of iterative reconstruction technique. CONTRAST:  OMNIPAQUE IOHEXOL 300 MG/ML  SOLN COMPARISON:  09/21/2022 FINDINGS: Lower chest: Posteromedial left lower lobe lung mass is again noted measuring 6.1 x 4.8 cm, image 14/6. On the previous exam this measured 5.4 x 4.0 cm. Adjacent pleural thickening is identified. Hepatobiliary: No suspicious liver lesion identified. Large stone within the gallbladder measures 4.6 cm. Sludge noted  within the gallbladder fundus. The common bile duct measures up to 0.9 cm which when compared with the previous exam is unchanged. No signs of choledocholithiasis or obstructing mass. Pancreas: Mild soft tissue stranding about the pancreatic parenchyma is identified, increased from 09/21/2022. Small cystic lesion within the head of pancreas measures 9 mm and is unchanged. Arising off the anterior aspect of the distal tail of pancreas is a small fluid collection measuring 1.6 x 1.2 cm, image 22/3. Previously 1.6 x 0.8 cm. Mild increase caliber of the main duct at  the level of the body and tail which appears new from the previous exam measuring up to 4 mm. Spleen: Normal in size without focal abnormality. Adrenals/Urinary Tract: Normal adrenal glands. No kidney mass or hydronephrosis. Bladder is unremarkable. Stomach/Bowel: Since the previous exam there is been interval placement of a transgastric drainage catheter into the large lesser sac pseudocyst. The pseudocyst has significantly diminished in volume compared with the previous exam. Currently this measures 4.7 x 3.7 by 7.6 cm. Formally 12.3 x 6.8 by 9.3 cm. New small fluid collection between the pancreatic body and a and stomach measures 2.3 x 1.9 cm. No pathologic dilatation of the large or small bowel loops. No bowel wall thickening or inflammation Vascular/Lymphatic: Aortic atherosclerosis. The upper abdominal vascularity including the portal vein and splenic vein remain patent. No signs of abdominopelvic adenopathy. Reproductive: Status post hysterectomy. No adnexal masses. Other: No pneumoperitoneum identified.  No significant free fluid.2 Musculoskeletal: No acute or significant osseous findings. IMPRESSION: 1. Since the previous exam there has been interval placement of a transgastric drainage catheter into the large lesser sac pseudocyst. The pseudocyst has significantly diminished in volume compared with the previous exam. 2. New small fluid collection  between the pancreatic body and stomach measures 2.3 x 1.9 cm. Favor small pseudocysts. 3. Mild soft tissue stranding about the pancreatic parenchyma is identified, increased from the previous exam. Findings are favored to represent acute pancreatitis. 4. Mild increase caliber of the main duct at the level of the body and tail of the pancreas which appears new from the previous exam measuring up to 4 mm. 5. Cholelithiasis. 6. Mild increase in size of the left lower lobe lung mass. 7.  Aortic Atherosclerosis (ICD10-I70.0). Electronically Signed   By: Signa Kell M.D.   On: 10/05/2022 10:34   [place] EKG SITE RITE  Result Date: 09/30/2022 If Site Rite image not attached, placement could not be confirmed due to current cardiac rhythm.  MR BRAIN W WO CONTRAST  Result Date: 09/29/2022 CLINICAL DATA:  Metastatic disease evaluation. Non-small cell lung cancer. EXAM: MRI HEAD WITHOUT AND WITH CONTRAST TECHNIQUE: Multiplanar, multiecho pulse sequences of the brain and surrounding structures were obtained without and with intravenous contrast. CONTRAST:  10mL GADAVIST GADOBUTROL 1 MMOL/ML IV SOLN COMPARISON:  CT head without contrast 08/13/2022. MR head without and with contrast 08/07/2022 FINDINGS: Brain: A peripherally enhancing lesion in the posterior left frontal lobe is grossly stable in size measuring 15 x 13 x 14 mm. The surrounding vasogenic edema is slightly decreased from prior exam. A punctate enhancing metastasis in the medial right frontal lobe is stable. No new foci of enhancement are present. No acute infarct or hemorrhage is present. No other significant white matter disease is present. Deep brain nuclei are within normal limits. The ventricles are of normal size. No significant extraaxial fluid collection is present. Mild white matter changes extend into the brainstem. The cerebellum is within normal limits bilaterally. The internal auditory canals are within normal limits. Midline structures are within  normal limits. No other pathologic enhancement is present. Vascular: Flow is present in the major intracranial arteries. Skull and upper cervical spine: The craniocervical junction is normal. Upper cervical spine is within normal limits. Marrow signal is unremarkable. Sinuses/Orbits: Small mastoid effusions are present. Mild mucosal thickening is present within ethmoid air cells. Paranasal sinuses are otherwise clear. The globes and orbits are within normal limits. IMPRESSION: 1. Stable size of 15 x 13 x 14 mm peripherally enhancing metastasis in the posterior left  frontal lobe. The surrounding vasogenic edema is slightly decreased from prior exam. 2. Stable punctate enhancing metastasis in the medial right frontal lobe. 3. No new foci of enhancement to suggest new metastatic disease. 4. Minimal ethmoid sinus disease. Electronically Signed   By: Marin Roberts M.D.   On: 09/29/2022 18:17   [place] CHEST SOFT TISSUE  Result Date: 09/22/2022 CLINICAL DATA:  Mass left abdomen EXAM: LIMITED ULTRASOUND OF ABDOMINAL SOFT TISSUES TECHNIQUE: Ultrasound examination of the abdominal wall soft tissues was performed in the area of clinical concern. COMPARISON:  Abdominal CT 09/21/2022, MRI 09/05/2022 FINDINGS: No superficial abnormality noted. The fluid collection noted along the greater curvature of the stomach is again seen as noted on prior CT measuring 12.0 x 12.6 x 4.6 cm. This appears simple. IMPRESSION: 12.6 cm fluid collection along the greater curvature of the stomach as seen on prior CT and MRI. This is most compatible with acute pancreatic fluid collection/developing pseudo cyst. Electronically Signed   By: Charlett Nose M.D.   On: 09/22/2022 18:02   CT ABDOMEN PELVIS W CONTRAST  Result Date: 09/21/2022 CLINICAL DATA:  Acute abdominal pain EXAM: CT ABDOMEN AND PELVIS WITH CONTRAST TECHNIQUE: Multidetector CT imaging of the abdomen and pelvis was performed using the standard protocol following bolus  administration of intravenous contrast. RADIATION DOSE REDUCTION: This exam was performed according to the departmental dose-optimization program which includes automated exposure control, adjustment of the mA and/or kV according to patient size and/or use of iterative reconstruction technique. CONTRAST:  OMNIPAQUE IOHEXOL 300 MG/ML  SOLN COMPARISON:  CT abdomen and pelvis 09/04/2022. MRI abdomen 09/05/2022. FINDINGS: Lower chest: There is a new small right pleural effusion. Left lower lobe mass measures 3.7 x 4.8 cm and appears unchanged. Hepatobiliary: Gallstones are again seen including a large gallstone measuring 4.3 cm. There is no biliary ductal dilatation. No focal liver lesions are seen. Pancreas: Hypodensity in the head of the pancreas measuring 7 mm image 2/34 appears unchanged. Focal hypodensity in the pancreatic tail measuring 15 x 8 mm image 2/22 is unchanged. No acute inflammation or ductal dilatation. Spleen: Normal in size without focal abnormality. Adrenals/Urinary Tract: Adrenal glands are unremarkable. Kidneys are normal, without renal calculi, focal lesion, or hydronephrosis. Bladder is unremarkable. Stomach/Bowel: Enhancing fluid collection abutting the greater curvature of the stomach is again seen and has increased in size now measuring 9.3 by 12.3 by 6.8 cm (previously 6.1 by 8.7 x 5.3 cm). Second smaller fluid collection adjacent to the gastric fundus image 2/17 measures 1.7 x 1.3 by 1.0 cm and appears similar to the prior study. The stomach is otherwise within normal limits. There is no bowel obstruction, pneumatosis or free air. The appendix is not seen. There is sigmoid colon diverticulosis. Vascular/Lymphatic: Aortic atherosclerosis. No enlarged abdominal or pelvic lymph nodes. Reproductive: Status post hysterectomy. No adnexal masses. Other: There is a small amount of free fluid in the pelvis and right upper quadrant, unchanged. There is mild body wall edema, increased from prior.  Musculoskeletal: Degenerative changes affect the spine and hips. Sclerotic density in the left femoral head is unchanged. IMPRESSION: 1. Enhancing fluid collection abutting the greater curvature of the stomach has mildly increased in size. 2. Stable small amount of free fluid in the abdomen and pelvis. 3. New small right pleural effusion. 4. Stable left lower lobe mass. 5. Stable stable hypodense lesions in the pancreas, unchanged from prior MRI. 6. Increased body wall edema. 7. Cholelithiasis. Aortic Atherosclerosis (ICD10-I70.0). Electronically Signed   By:  Darliss Cheney M.D.   On: 09/21/2022 20:20     Discharge Exam: Vitals:   10/07/22 2029 10/08/22 0409  BP: (!) 115/52 (!) 109/49  Pulse: 81 79  Resp: 14 14  Temp: 98.4 F (36.9 C) 98.4 F (36.9 C)  SpO2: 97% 98%   Vitals:   10/07/22 0528 10/07/22 1321 10/07/22 2029 10/08/22 0409  BP: (!) 107/52 (!) 119/50 (!) 115/52 (!) 109/49  Pulse: 73 80 81 79  Resp: 16 20 14 14   Temp: 98.4 F (36.9 C) 98 F (36.7 C) 98.4 F (36.9 C) 98.4 F (36.9 C)  TempSrc: Oral Oral Oral Oral  SpO2: 93% 99% 97% 98%  Weight:      Height:        General: Pt is alert, awake, not in acute distress Cardiovascular: RRR, S1/S2 +, no rubs, no gallops Respiratory: CTA bilaterally, no wheezing, no rhonchi Abdominal: Soft, NT, ND, bowel sounds + Extremities: no edema, no cyanosis    The results of significant diagnostics from this hospitalization (including imaging, microbiology, ancillary and laboratory) are listed below for reference.     Microbiology: No results found for this or any previous visit (from the past 240 hour(s)).   Labs: BNP (last 3 results) No results for input(s): "BNP" in the last 8760 hours. Basic Metabolic Panel: Recent Labs  Lab 10/05/22 0722 10/05/22 1249 10/06/22 0500 10/07/22 0425  NA 132*  --  139 137  K 3.3*  --  3.4* 3.6  CL 95*  --  101 104  CO2 26  --  28 26  GLUCOSE 204*  --  132* 119*  BUN 12  --  6* <5*   CREATININE 0.74 0.64 0.61 0.59  CALCIUM 8.2*  --  8.1* 7.7*  MG  --  1.3* 1.3* 2.2   Liver Function Tests: Recent Labs  Lab 10/05/22 0722 10/06/22 0500 10/07/22 0425  AST 15 16 17   ALT 18 17 18   ALKPHOS 65 58 56  BILITOT 0.9 1.0 0.8  PROT 5.6* 4.9* 4.8*  ALBUMIN 2.3* 2.1* 2.0*   Recent Labs  Lab 10/05/22 0722  LIPASE 215*   No results for input(s): "AMMONIA" in the last 168 hours. CBC: Recent Labs  Lab 10/05/22 0722 10/05/22 1249 10/06/22 0500 10/07/22 0425  WBC 4.2 3.8* 4.2 3.3*  NEUTROABS  --   --   --  1.6*  HGB 9.3* 9.1* 8.7* 8.5*  HCT 29.4* 29.0* 27.9* 27.0*  MCV 97.0 96.7 97.2 98.5  PLT 187 192 189 194   Cardiac Enzymes: No results for input(s): "CKTOTAL", "CKMB", "CKMBINDEX", "TROPONINI" in the last 168 hours. BNP: Invalid input(s): "POCBNP" CBG: Recent Labs  Lab 10/07/22 1611 10/07/22 2031 10/08/22 0001 10/08/22 0408 10/08/22 0836  GLUCAP 109* 136* 113* 97 125*   D-Dimer No results for input(s): "DDIMER" in the last 72 hours. Hgb A1c No results for input(s): "HGBA1C" in the last 72 hours. Lipid Profile No results for input(s): "CHOL", "HDL", "LDLCALC", "TRIG", "CHOLHDL", "LDLDIRECT" in the last 72 hours. Thyroid function studies No results for input(s): "TSH", "T4TOTAL", "T3FREE", "THYROIDAB" in the last 72 hours.  Invalid input(s): "FREET3" Anemia work up No results for input(s): "VITAMINB12", "FOLATE", "FERRITIN", "TIBC", "IRON", "RETICCTPCT" in the last 72 hours. Urinalysis    Component Value Date/Time   COLORURINE YELLOW 10/05/2022 0900   APPEARANCEUR HAZY (A) 10/05/2022 0900   LABSPEC 1.012 10/05/2022 0900   PHURINE 5.0 10/05/2022 0900   GLUCOSEU NEGATIVE 10/05/2022 0900   HGBUR NEGATIVE 10/05/2022 0900  BILIRUBINUR NEGATIVE 10/05/2022 0900   KETONESUR NEGATIVE 10/05/2022 0900   PROTEINUR NEGATIVE 10/05/2022 0900   UROBILINOGEN 0.2 04/07/2011 2356   NITRITE NEGATIVE 10/05/2022 0900   LEUKOCYTESUR NEGATIVE 10/05/2022 0900    Sepsis Labs Recent Labs  Lab 10/05/22 0722 10/05/22 1249 10/06/22 0500 10/07/22 0425  WBC 4.2 3.8* 4.2 3.3*   Microbiology No results found for this or any previous visit (from the past 240 hour(s)).  FURTHER DISCHARGE INSTRUCTIONS:   Get Medicines reviewed and adjusted: Please take all your medications with you for your next visit with your Primary MD   Laboratory/radiological data: Please request your Primary MD to go over all hospital tests and procedure/radiological results at the follow up, please ask your Primary MD to get all Hospital records sent to his/her office.   In some cases, they will be blood work, cultures and biopsy results pending at the time of your discharge. Please request that your primary care M.D. goes through all the records of your hospital data and follows up on these results.   Also Note the following: If you experience worsening of your admission symptoms, develop shortness of breath, life threatening emergency, suicidal or homicidal thoughts you must seek medical attention immediately by calling 911 or calling your MD immediately  if symptoms less severe.   You must read complete instructions/literature along with all the possible adverse reactions/side effects for all the Medicines you take and that have been prescribed to you. Take any new Medicines after you have completely understood and accpet all the possible adverse reactions/side effects.    Do not drive when taking Pain medications or sleeping medications (Benzodaizepines)   Do not take more than prescribed Pain, Sleep and Anxiety Medications. It is not advisable to combine anxiety,sleep and pain medications without talking with your primary care practitioner   Special Instructions: If you have smoked or chewed Tobacco  in the last 2 yrs please stop smoking, stop any regular Alcohol  and or any Recreational drug use.   Wear Seat belts while driving.   Please note: You were cared for by a  hospitalist during your hospital stay. Once you are discharged, your primary care physician will handle any further medical issues. Please note that NO REFILLS for any discharge medications will be authorized once you are discharged, as it is imperative that you return to your primary care physician (or establish a relationship with a primary care physician if you do not have one) for your post hospital discharge needs so that they can reassess your need for medications and monitor your lab values  Time coordinating discharge: Over 30 minutes  SIGNED:   Hughie Closs, MD  [facility] 10/08/2022, 11:16 AM *Please note that this is a verbal dictation therefore any spelling or grammatical errors are due to the "[employer]" system interpretation. If 7PM-7AM, please contact night-coverage [email or web address]

## 2022-10-08 NOTE — Telephone Encounter (Signed)
Line rings no answer no voice mail will mail all information to her home address

## 2022-10-08 NOTE — Evaluation (Signed)
Physical Therapy Evaluation Patient Details Name: Carolyn Hooper MRN: [id number] DOB: 02/25/1961 Today's Date: 10/08/2022  History of Present Illness  Pt is a 62 y/o female presenting to ED on 10/05/2022 with CT revealing episode of acute pancreatitis with new small fluid collection and found to be hypokalemic. Pt seen by GI and recommendation for conservative measures. Pt provided with IV dilaudid and pain improved.  Pt hospitalized 7/6-7/15/2024 with abdominal pain and hospitalized 6/19-6/27 with epigastric pain. CT abdomen revealed enhancing fluid collection in stomach. S/p cystogastrostomy on 7/11. MRI of brain on 09/29/2022 reveals stable size of 15 x 13 x 14 mm peripherally enhancing metastasis in the posterior left frontal lobe, stable punctate enhancing metastasis in the medial right frontal lobe. Pt PMH includes but is not limited to:  non-small cell lung CA, brain metastasis, focal seizures, DM and pt currently on immunotherapy/chemotherapy.  Clinical Impression    Pt admitted with above diagnosis.  Pt currently with functional limitations due to the deficits listed below (see PT Problem List). Pt in bed resting when PT arrived. Pt agreeable to therapy pt required increased time for functional mobility tasks and indicated increased fatigue. Pt exhibits a significant decline in activity tolerance as compared to PT eval 09/30/2022. Pt reports she is having difficulty communicating. Pt is S for bed mobility, pt able to sit EOB and perform dressing tasks, S for toileting and gait assessed in personal room 15 feet x 2 with RW and shuffling pattern. Pt reported that there was no way she could go walk in the hallway she was exhausted. PT communicated with nursing staff per pt change in presentation. B UE tremors impacting pt ability to dial family phone numbers and PT assisted prior to exiting the room. Pt in bed and all needs met.  Pt abdominal pain has resolved at this time.  Pt will benefit from acute  skilled PT to increase their independence and safety with mobility to allow discharge to mothers home with family support and recommendation for [facility] services.         Assistance Recommended at Discharge Intermittent Supervision/Assistance  If plan is discharge home, recommend the following:  Can travel by private vehicle  A little help with walking and/or transfers;A little help with bathing/dressing/bathroom;Assistance with cooking/housework;Help with stairs or ramp for entrance;Assist for transportation        Equipment Recommendations Rolling walker (2 wheels)  Recommendations for Other Services       Functional Status Assessment Patient has had a recent decline in their functional status and demonstrates the ability to make significant improvements in function in a reasonable and predictable amount of time.     Precautions / Restrictions Precautions Precautions: Fall Restrictions Weight Bearing Restrictions: No      Mobility  Bed Mobility Overal bed mobility: Needs Assistance Bed Mobility: Supine to Sit, Sit to Supine     Supine to sit: Supervision Sit to supine: Supervision   General bed mobility comments: min cues    Transfers Overall transfer level: Needs assistance Equipment used: Rolling walker (2 wheels) Transfers: Sit to/from Stand Sit to Stand: Supervision           General transfer comment: min cues for UE placement with bed and commode transfer    Ambulation/Gait Ambulation/Gait assistance: Supervision Gait Distance (Feet): 15 Feet Assistive device: Rolling walker (2 wheels) Gait Pattern/deviations: Step-to pattern, Wide base of support, Trunk flexed, Shuffle Gait velocity: decreased     General Gait Details: gait limited due to fatigue today.  pt is able to amb in personal room with RW with S suffling pattern and slow cadence  Stairs            Wheelchair Mobility     Tilt Bed    Modified Rankin (Stroke Patients Only)        Balance Overall balance assessment: Needs assistance Sitting-balance support: Feet supported Sitting balance-Leahy Scale: Good     Standing balance support: During functional activity Standing balance-Leahy Scale: Fair Standing balance comment: static stand no UE support                             Pertinent Vitals/Pain Pain Assessment Pain Assessment: No/denies pain (no abdominal pain at time of eval)    Home Living Family/patient expects to be discharged to:: Private residence Living Arrangements: Parent;Children Available Help at Discharge: Family;Available PRN/intermittently Type of Home: Mobile home Home Access: Ramped entrance       Home Layout: One level Home Equipment: Cane - single point;Shower Counsellor (2 wheels) Additional Comments: pt reports d/c plan to transition home with mother and son    Prior Function Prior Level of Function : Independent/Modified Independent;Working/employed;Driving             Mobility Comments: RW for home navigation ADLs Comments: pt reports family assist with IADLs     Hand Dominance   Dominant Hand: Right    Extremity/Trunk Assessment   Upper Extremity Assessment Upper Extremity Assessment: Overall WFL for tasks assessed    Lower Extremity Assessment Lower Extremity Assessment: Generalized weakness    Cervical / Trunk Assessment Cervical / Trunk Assessment: Normal  Communication   Communication: No difficulties (delayed responses attributed to brain metastatic ca)  Cognition Arousal/Alertness: Awake/alert (pt sleeping and easily roused) Behavior During Therapy: WFL for tasks assessed/performed Overall Cognitive Status: Within Functional Limits for tasks assessed Area of Impairment: Problem solving, Attention, Following commands                   Current Attention Level: Focused   Following Commands: Follows multi-step commands consistently     Problem Solving: Slow  processing General Comments: some delayed responses,and mild wordfinding difficulties noted        General Comments General comments (skin integrity, edema, etc.): Pt reports increased fatigue slince last hospitalization and limited sleep last night, pt states it is hard to talk- choppy and delayed speach pattern as well as word finding as compared to last admission and L heel rubor and pain.    Exercises     Assessment/Plan    PT Assessment Patient needs continued PT services  PT Problem List Decreased activity tolerance;Decreased balance;Decreased mobility;Decreased coordination       PT Treatment Interventions DME instruction;Gait training;Stair training;Functional mobility training;Therapeutic activities;Therapeutic exercise;Balance training;Neuromuscular re-education;Patient/family education    PT Goals (Current goals can be found in the Care Plan section)  Acute Rehab PT Goals Patient Stated Goal: to be able to go home/mothers house today and to speak better PT Goal Formulation: With patient Time For Goal Achievement: 10/29/22 Potential to Achieve Goals: Good    Frequency Min 1X/week     Co-evaluation               AM-PAC PT "6 Clicks" Mobility  Outcome Measure Help needed turning from your back to your side while in a flat bed without using bedrails?: None Help needed moving from lying on your back to sitting on the side of  a flat bed without using bedrails?: None Help needed moving to and from a bed to a chair (including a wheelchair)?: None Help needed standing up from a chair using your arms (e.g., wheelchair or bedside chair)?: A Little Help needed to walk in hospital room?: A Little Help needed climbing 3-5 steps with a railing? : A Lot 6 Click Score: 20    End of Session Equipment Utilized During Treatment: Gait belt Activity Tolerance: Patient tolerated treatment well Patient left: in bed;with call bell/phone within reach;with bed alarm set Nurse  Communication: Mobility status PT Visit Diagnosis: Unsteadiness on feet (R26.81);Muscle weakness (generalized) (M62.81);Difficulty in walking, not elsewhere classified (R26.2)    Time: 6283-1517 PT Time Calculation (min) (ACUTE ONLY): 42 min   Charges:   PT Evaluation $PT Eval Low Complexity: 1 Low PT Treatments $Gait Training: 8-22 mins $Therapeutic Activity: 8-22 mins PT General Charges $$ ACUTE PT VISIT: 1 Visit         Johnny Bridge, PT Acute Rehab   Jacqualyn Posey 10/08/2022, 12:58 PM

## 2022-10-09 ENCOUNTER — Telehealth: Payer: Self-pay | Admitting: Medical Oncology

## 2022-10-09 ENCOUNTER — Encounter: Payer: Self-pay | Admitting: Internal Medicine

## 2022-10-09 NOTE — Telephone Encounter (Signed)
Appts- I told pt that Dr Barbaraann Cao can see her earlier than August 29th and that he cancelled her MRI brain for August .   I confirmed her appts for August 1st and told her she does not need a CT chest.

## 2022-10-10 ENCOUNTER — Telehealth: Payer: Self-pay

## 2022-10-10 ENCOUNTER — Telehealth: Payer: Self-pay | Admitting: Family

## 2022-10-10 ENCOUNTER — Telehealth: Payer: Self-pay | Admitting: Medical Oncology

## 2022-10-10 NOTE — Transitions of Care (Post Inpatient/ED Visit) (Signed)
   10/10/2022  Name: MAIREN WALLENSTEIN MRN: [id number] DOB: 07-Feb-1961  Today's TOC FU Call Status: Today's TOC FU Call Status:: Unsuccessul Call (1st Attempt) Unsuccessful Call (1st Attempt) Date: 10/10/22  Attempted to reach the patient regarding the most recent Inpatient/ED visit.  Follow Up Plan: Additional outreach attempts will be made to reach the patient to complete the Transitions of Care (Post Inpatient/ED visit) call.   Jodelle Gross, RN, BSN, CCM Care Management Coordinator [place] Phone: [phone]/Fax: [phone]

## 2022-10-10 NOTE — Telephone Encounter (Signed)
Patient aware of why the valtrex was sent in and reviewed labs.  Patient is now asking to cut back on her diabetic meds because she just thinks it is too much at this point. She does not want to wait for a response from [facility]. She wants covering to address does not think she can wait till Monday, Average blood sugar is running 142.

## 2022-10-10 NOTE — Telephone Encounter (Signed)
Pt wants to know why she was prescribed valACYclovir (VALTREX) 500 MG tablet  Pt also wants to talk to nurse about her taking too much insulin and metformin. She says that these two rx are affecting her diet and not eating much. Please call back

## 2022-10-10 NOTE — Telephone Encounter (Signed)
Medication management- Carolyn Hooper asked if she should continue Valtrex and she is taking in only clear liquids. Can she progress her diet from clear liquids?  I instructed her to call PCP re this and her diabetic meds.

## 2022-10-10 NOTE — Telephone Encounter (Signed)
Per son pt is lying down for a nap. Son is not on HIPAA. Advised to have pt call back on Monday to speak with [facility]

## 2022-10-11 ENCOUNTER — Telehealth: Payer: Self-pay | Admitting: Family

## 2022-10-11 ENCOUNTER — Telehealth: Payer: Self-pay

## 2022-10-11 NOTE — Telephone Encounter (Signed)
  Carolyn Hooper changed insulin to 15units a day and two 1000 mg of metformin, BS has been around 150, this morning it was 96 after a couple of sips of slim fast she is scared of going into a insulin coma. Pt states this is urgent she is a cancer pt   Patient has not taken any medication today because she is concerned it will bottom her out and wants to know what you think she should do.  Patient aware [name] is off.

## 2022-10-11 NOTE — Telephone Encounter (Signed)
She is not going into insulin coma. Take med as prescribed and continue to check blood sugars

## 2022-10-11 NOTE — Telephone Encounter (Signed)
Spoke with patient and she states that she is concerned her BS is low. It was 96 this morning and she did not take her insulin and hasn't taken metformin today either. I had her recheck her BS while we were on the phone and it was 117. She states that she is not able to eat anything because she doesn't want it. Advised that she needs to check her BS in the AM over the weekend and if its low to hold medication. Also advised to check her BS in the evening and keep a log. Patient would like for you to contact her when you return to the office to discuss. She is staying with her mother and her number is [phone].

## 2022-10-11 NOTE — Transitions of Care (Post Inpatient/ED Visit) (Signed)
   10/11/2022  Name: Carolyn Hooper MRN: [id number] DOB: 02/09/1961  Today's TOC FU Call Status: Today's TOC FU Call Status:: Unsuccessful Call (2nd Attempt) Unsuccessful Call (2nd Attempt) Date: 10/11/22  Attempted to reach the patient regarding the most recent Inpatient/ED visit.  Follow Up Plan: Additional outreach attempts will be made to reach the patient to complete the Transitions of Care (Post Inpatient/ED visit) call.   Jodelle Gross, RN, BSN, CCM Care Management Coordinator [place] Phone: [phone]/Fax: [phone]

## 2022-10-13 ENCOUNTER — Emergency Department (HOSPITAL_COMMUNITY): Payer: BC Managed Care – PPO

## 2022-10-13 ENCOUNTER — Inpatient Hospital Stay (HOSPITAL_COMMUNITY)
Admission: EM | Admit: 2022-10-13 | Discharge: 2022-11-04 | DRG: 054 | Disposition: A | Discharge status: Skilled Nursing Facility | Payer: BC Managed Care – PPO | Attending: Internal Medicine | Admitting: Internal Medicine

## 2022-10-13 DIAGNOSIS — K219 Gastro-esophageal reflux disease without esophagitis: Secondary | ICD-10-CM | POA: Diagnosis not present

## 2022-10-13 DIAGNOSIS — Z515 Encounter for palliative care: Secondary | ICD-10-CM

## 2022-10-13 DIAGNOSIS — I10 Essential (primary) hypertension: Secondary | ICD-10-CM | POA: Diagnosis not present

## 2022-10-13 DIAGNOSIS — G4700 Insomnia, unspecified: Secondary | ICD-10-CM | POA: Diagnosis present

## 2022-10-13 DIAGNOSIS — R531 Weakness: Secondary | ICD-10-CM | POA: Diagnosis not present

## 2022-10-13 DIAGNOSIS — Z7401 Bed confinement status: Secondary | ICD-10-CM | POA: Diagnosis not present

## 2022-10-13 DIAGNOSIS — Z79899 Other long term (current) drug therapy: Secondary | ICD-10-CM | POA: Diagnosis not present

## 2022-10-13 DIAGNOSIS — R4589 Other symptoms and signs involving emotional state: Secondary | ICD-10-CM | POA: Diagnosis not present

## 2022-10-13 DIAGNOSIS — R4587 Impulsiveness: Secondary | ICD-10-CM | POA: Diagnosis present

## 2022-10-13 DIAGNOSIS — G8929 Other chronic pain: Secondary | ICD-10-CM

## 2022-10-13 DIAGNOSIS — C3490 Malignant neoplasm of unspecified part of unspecified bronchus or lung: Secondary | ICD-10-CM | POA: Diagnosis present

## 2022-10-13 DIAGNOSIS — E1165 Type 2 diabetes mellitus with hyperglycemia: Secondary | ICD-10-CM | POA: Diagnosis present

## 2022-10-13 DIAGNOSIS — Z66 Do not resuscitate: Secondary | ICD-10-CM | POA: Diagnosis not present

## 2022-10-13 DIAGNOSIS — Z8249 Family history of ischemic heart disease and other diseases of the circulatory system: Secondary | ICD-10-CM

## 2022-10-13 DIAGNOSIS — B954 Other streptococcus as the cause of diseases classified elsewhere: Secondary | ICD-10-CM | POA: Diagnosis present

## 2022-10-13 DIAGNOSIS — Z8261 Family history of arthritis: Secondary | ICD-10-CM

## 2022-10-13 DIAGNOSIS — G9389 Other specified disorders of brain: Secondary | ICD-10-CM | POA: Diagnosis not present

## 2022-10-13 DIAGNOSIS — I4891 Unspecified atrial fibrillation: Secondary | ICD-10-CM | POA: Diagnosis not present

## 2022-10-13 DIAGNOSIS — F329 Major depressive disorder, single episode, unspecified: Secondary | ICD-10-CM | POA: Diagnosis not present

## 2022-10-13 DIAGNOSIS — R102 Pelvic and perineal pain: Secondary | ICD-10-CM

## 2022-10-13 DIAGNOSIS — Z794 Long term (current) use of insulin: Secondary | ICD-10-CM | POA: Diagnosis not present

## 2022-10-13 DIAGNOSIS — K863 Pseudocyst of pancreas: Secondary | ICD-10-CM | POA: Diagnosis present

## 2022-10-13 DIAGNOSIS — E876 Hypokalemia: Secondary | ICD-10-CM | POA: Diagnosis present

## 2022-10-13 DIAGNOSIS — E119 Type 2 diabetes mellitus without complications: Secondary | ICD-10-CM | POA: Diagnosis not present

## 2022-10-13 DIAGNOSIS — Z825 Family history of asthma and other chronic lower respiratory diseases: Secondary | ICD-10-CM

## 2022-10-13 DIAGNOSIS — F411 Generalized anxiety disorder: Secondary | ICD-10-CM | POA: Diagnosis present

## 2022-10-13 DIAGNOSIS — K8592 Acute pancreatitis with infected necrosis, unspecified: Secondary | ICD-10-CM | POA: Diagnosis not present

## 2022-10-13 DIAGNOSIS — Z7984 Long term (current) use of oral hypoglycemic drugs: Secondary | ICD-10-CM | POA: Diagnosis not present

## 2022-10-13 DIAGNOSIS — R109 Unspecified abdominal pain: Secondary | ICD-10-CM | POA: Diagnosis present

## 2022-10-13 DIAGNOSIS — G936 Cerebral edema: Secondary | ICD-10-CM | POA: Diagnosis present

## 2022-10-13 DIAGNOSIS — K851 Biliary acute pancreatitis without necrosis or infection: Secondary | ICD-10-CM | POA: Diagnosis present

## 2022-10-13 DIAGNOSIS — F419 Anxiety disorder, unspecified: Secondary | ICD-10-CM | POA: Diagnosis not present

## 2022-10-13 DIAGNOSIS — Z823 Family history of stroke: Secondary | ICD-10-CM

## 2022-10-13 DIAGNOSIS — Z841 Family history of disorders of kidney and ureter: Secondary | ICD-10-CM

## 2022-10-13 DIAGNOSIS — R569 Unspecified convulsions: Secondary | ICD-10-CM

## 2022-10-13 DIAGNOSIS — R45851 Suicidal ideations: Secondary | ICD-10-CM | POA: Diagnosis not present

## 2022-10-13 DIAGNOSIS — K859 Acute pancreatitis without necrosis or infection, unspecified: Secondary | ICD-10-CM | POA: Diagnosis not present

## 2022-10-13 DIAGNOSIS — Z91048 Other nonmedicinal substance allergy status: Secondary | ICD-10-CM

## 2022-10-13 DIAGNOSIS — B3789 Other sites of candidiasis: Secondary | ICD-10-CM | POA: Diagnosis not present

## 2022-10-13 DIAGNOSIS — M86171 Other acute osteomyelitis, right ankle and foot: Secondary | ICD-10-CM | POA: Diagnosis not present

## 2022-10-13 DIAGNOSIS — Z888 Allergy status to other drugs, medicaments and biological substances status: Secondary | ICD-10-CM

## 2022-10-13 DIAGNOSIS — L732 Hidradenitis suppurativa: Secondary | ICD-10-CM

## 2022-10-13 DIAGNOSIS — K5730 Diverticulosis of large intestine without perforation or abscess without bleeding: Secondary | ICD-10-CM | POA: Diagnosis not present

## 2022-10-13 DIAGNOSIS — Z8489 Family history of other specified conditions: Secondary | ICD-10-CM

## 2022-10-13 DIAGNOSIS — R42 Dizziness and giddiness: Secondary | ICD-10-CM | POA: Diagnosis not present

## 2022-10-13 DIAGNOSIS — Z833 Family history of diabetes mellitus: Secondary | ICD-10-CM

## 2022-10-13 DIAGNOSIS — M545 Low back pain, unspecified: Secondary | ICD-10-CM | POA: Diagnosis not present

## 2022-10-13 DIAGNOSIS — R9431 Abnormal electrocardiogram [ECG] [EKG]: Secondary | ICD-10-CM | POA: Diagnosis not present

## 2022-10-13 DIAGNOSIS — D333 Benign neoplasm of cranial nerves: Secondary | ICD-10-CM | POA: Diagnosis not present

## 2022-10-13 DIAGNOSIS — K8020 Calculus of gallbladder without cholecystitis without obstruction: Secondary | ICD-10-CM | POA: Diagnosis not present

## 2022-10-13 DIAGNOSIS — C7931 Secondary malignant neoplasm of brain: Principal | ICD-10-CM | POA: Diagnosis present

## 2022-10-13 DIAGNOSIS — D696 Thrombocytopenia, unspecified: Secondary | ICD-10-CM | POA: Diagnosis present

## 2022-10-13 DIAGNOSIS — C3492 Malignant neoplasm of unspecified part of left bronchus or lung: Secondary | ICD-10-CM

## 2022-10-13 DIAGNOSIS — Z87891 Personal history of nicotine dependence: Secondary | ICD-10-CM

## 2022-10-13 DIAGNOSIS — M549 Dorsalgia, unspecified: Secondary | ICD-10-CM | POA: Diagnosis not present

## 2022-10-13 DIAGNOSIS — K862 Cyst of pancreas: Secondary | ICD-10-CM | POA: Diagnosis not present

## 2022-10-13 DIAGNOSIS — F4323 Adjustment disorder with mixed anxiety and depressed mood: Secondary | ICD-10-CM | POA: Diagnosis not present

## 2022-10-13 DIAGNOSIS — Z7189 Other specified counseling: Secondary | ICD-10-CM | POA: Diagnosis not present

## 2022-10-13 LAB — COMPREHENSIVE METABOLIC PANEL
ALT: 18 U/L (ref 0–44)
AST: 31 U/L (ref 15–41)
Albumin: 2.6 g/dL — ABNORMAL LOW (ref 3.5–5.0)
Alkaline Phosphatase: 78 U/L (ref 38–126)
Anion gap: 17 — ABNORMAL HIGH (ref 5–15)
BUN: 10 mg/dL (ref 8–23)
CO2: 22 mmol/L (ref 22–32)
Calcium: 8.3 mg/dL — ABNORMAL LOW (ref 8.9–10.3)
Chloride: 96 mmol/L — ABNORMAL LOW (ref 98–111)
Creatinine, Ser: 1 mg/dL (ref 0.44–1.00)
GFR, Estimated: >60 mL/min (ref >60)
Glucose, Bld: 111 mg/dL — ABNORMAL HIGH (ref 70–99)
Potassium: 3.4 mmol/L — ABNORMAL LOW (ref 3.5–5.1)
Sodium: 135 mmol/L (ref 135–145)
Total Bilirubin: 1.2 mg/dL (ref 0.3–1.2)
Total Protein: 5.9 g/dL — ABNORMAL LOW (ref 6.5–8.1)

## 2022-10-13 LAB — CBC WITH DIFFERENTIAL/PLATELET
Abs Immature Granulocytes: 0.19 10*3/uL — ABNORMAL HIGH (ref 0.00–0.07)
Basophils Absolute: 0.1 10*3/uL (ref 0.0–0.1)
Basophils Relative: 1 %
Eosinophils Absolute: 0.1 10*3/uL (ref 0.0–0.5)
Eosinophils Relative: 1 %
HCT: 35.8 % — ABNORMAL LOW (ref 36.0–46.0)
Hemoglobin: 10.8 g/dL — ABNORMAL LOW (ref 12.0–15.0)
Immature Granulocytes: 4 %
Lymphocytes Relative: 20 %
Lymphs Abs: 0.9 10*3/uL (ref 0.7–4.0)
MCH: 29.8 pg (ref 26.0–34.0)
MCHC: 30.2 g/dL (ref 30.0–36.0)
MCV: 98.6 fL (ref 80.0–100.0)
Monocytes Absolute: 0.6 10*3/uL (ref 0.1–1.0)
Monocytes Relative: 13 %
Neutro Abs: 2.8 10*3/uL (ref 1.7–7.7)
Neutrophils Relative %: 61 %
Platelets: 186 10*3/uL (ref 150–400)
RBC: 3.63 MIL/uL — ABNORMAL LOW (ref 3.87–5.11)
RDW: 17.2 % — ABNORMAL HIGH (ref 11.5–15.5)
WBC: 4.5 10*3/uL (ref 4.0–10.5)
nRBC: 0 % (ref 0.0–0.2)

## 2022-10-13 LAB — GLUCOSE, CAPILLARY
Glucose-Capillary: 242 mg/dL — ABNORMAL HIGH (ref 70–99)
Glucose-Capillary: 231 mg/dL — ABNORMAL HIGH (ref 70–99)

## 2022-10-13 LAB — URINALYSIS, ROUTINE W REFLEX MICROSCOPIC
Bilirubin Urine: NEGATIVE
Glucose, UA: NEGATIVE mg/dL
Hgb urine dipstick: NEGATIVE
Ketones, ur: 20 mg/dL — AB
Nitrite: NEGATIVE
Protein, ur: NEGATIVE mg/dL
Specific Gravity, Urine: 1.009 (ref 1.005–1.030)
pH: 5 (ref 5.0–8.0)

## 2022-10-13 LAB — LIPASE, BLOOD: Lipase: 297 U/L — ABNORMAL HIGH (ref 11–51)

## 2022-10-13 LAB — CBG MONITORING, ED
Glucose-Capillary: 105 mg/dL — ABNORMAL HIGH (ref 70–99)
Glucose-Capillary: 97 mg/dL (ref 70–99)
Glucose-Capillary: 186 mg/dL — ABNORMAL HIGH (ref 70–99)

## 2022-10-13 MED ORDER — ONDANSETRON HCL 4 MG PO TABS
4.0000 mg | ORAL_TABLET | Freq: Four times a day (QID) | ORAL | Status: DC | PRN
  Administered 2022-10-20: 4 mg via ORAL
  Filled 2022-10-13: qty 1

## 2022-10-13 MED ORDER — PANTOPRAZOLE SODIUM 40 MG PO TBEC
40.0000 mg | DELAYED_RELEASE_TABLET | Freq: Every day | ORAL | Status: DC
  Administered 2022-10-13 – 2022-11-04 (×22): 40 mg via ORAL
  Filled 2022-10-13 (×23): qty 1

## 2022-10-13 MED ORDER — FLUCONAZOLE 200 MG PO TABS
800.0000 mg | ORAL_TABLET | Freq: Every day | ORAL | Status: AC
  Administered 2022-10-13 – 2022-10-23 (×11): 800 mg via ORAL
  Filled 2022-10-13 (×11): qty 4

## 2022-10-13 MED ORDER — FOLIC ACID 1 MG PO TABS
1.0000 mg | ORAL_TABLET | Freq: Every day | ORAL | Status: DC
  Administered 2022-10-13 – 2022-11-04 (×22): 1 mg via ORAL
  Filled 2022-10-13 (×22): qty 1

## 2022-10-13 MED ORDER — LEVETIRACETAM 500 MG PO TABS
1000.0000 mg | ORAL_TABLET | Freq: Two times a day (BID) | ORAL | Status: DC
  Administered 2022-10-13 – 2022-11-04 (×45): 1000 mg via ORAL
  Filled 2022-10-13 (×46): qty 2

## 2022-10-13 MED ORDER — LORAZEPAM 2 MG/ML IJ SOLN
1.0000 mg | INTRAMUSCULAR | Status: DC | PRN
  Administered 2022-10-26 – 2022-10-27 (×2): 1 mg via INTRAVENOUS
  Filled 2022-10-13 (×4): qty 1

## 2022-10-13 MED ORDER — CLONAZEPAM 0.5 MG PO TABS
0.5000 mg | ORAL_TABLET | Freq: Two times a day (BID) | ORAL | Status: DC | PRN
  Administered 2022-10-13 – 2022-10-22 (×14): 0.5 mg via ORAL
  Filled 2022-10-13 (×16): qty 1

## 2022-10-13 MED ORDER — LACTATED RINGERS IV SOLN
INTRAVENOUS | Status: DC
  Administered 2022-10-14 – 2022-10-16 (×2): 100 mL/h via INTRAVENOUS

## 2022-10-13 MED ORDER — CHLORHEXIDINE GLUCONATE CLOTH 2 % EX PADS
6.0000 | MEDICATED_PAD | Freq: Every day | CUTANEOUS | Status: DC
  Administered 2022-10-13 – 2022-11-04 (×23): 6 via TOPICAL

## 2022-10-13 MED ORDER — VALACYCLOVIR HCL 500 MG PO TABS: 500.0000 mg | ORAL_TABLET | Freq: Every day | ORAL | Status: DC | PRN

## 2022-10-13 MED ORDER — SODIUM CHLORIDE 0.9% FLUSH
10.0000 mL | Freq: Two times a day (BID) | INTRAVENOUS | Status: DC
  Administered 2022-10-18 – 2022-11-03 (×14): 10 mL

## 2022-10-13 MED ORDER — PANCRELIPASE (LIP-PROT-AMYL) 12000-38000 UNITS PO CPEP
36000.0000 [IU] | ORAL_CAPSULE | ORAL | Status: DC | PRN
  Administered 2022-10-13 – 2022-10-17 (×2): 36000 [IU] via ORAL
  Filled 2022-10-13 (×3): qty 3

## 2022-10-13 MED ORDER — MAGNESIUM SULFATE 2 GM/50ML IV SOLN
2.0000 g | Freq: Once | INTRAVENOUS | Status: AC
  Administered 2022-10-13: 2 g via INTRAVENOUS
  Filled 2022-10-13: qty 50

## 2022-10-13 MED ORDER — OXYCODONE HCL 5 MG PO TABS
5.0000 mg | ORAL_TABLET | Freq: Three times a day (TID) | ORAL | Status: DC | PRN
  Administered 2022-10-13 – 2022-10-23 (×24): 5 mg via ORAL
  Filled 2022-10-13 (×26): qty 1

## 2022-10-13 MED ORDER — SODIUM CHLORIDE 0.9% FLUSH: 10.0000 mL | INTRAVENOUS | Status: DC | PRN

## 2022-10-13 MED ORDER — DIAZEPAM 5 MG/ML IJ SOLN: 10.0000 mg | INTRAMUSCULAR | Status: DC | PRN

## 2022-10-13 MED ORDER — DEXAMETHASONE SODIUM PHOSPHATE 4 MG/ML IJ SOLN
4.0000 mg | Freq: Four times a day (QID) | INTRAMUSCULAR | Status: DC
  Administered 2022-10-13 – 2022-11-04 (×89): 4 mg via INTRAVENOUS
  Filled 2022-10-13 (×88): qty 1

## 2022-10-13 MED ORDER — POTASSIUM CHLORIDE CRYS ER 20 MEQ PO TBCR
40.0000 meq | EXTENDED_RELEASE_TABLET | Freq: Once | ORAL | Status: AC
  Administered 2022-10-13: 40 meq via ORAL
  Filled 2022-10-13: qty 2

## 2022-10-13 MED ORDER — LEVOFLOXACIN 500 MG PO TABS
500.0000 mg | ORAL_TABLET | Freq: Every day | ORAL | Status: DC
  Administered 2022-10-13 – 2022-11-04 (×23): 500 mg via ORAL
  Filled 2022-10-13 (×22): qty 1

## 2022-10-13 MED ORDER — ONDANSETRON HCL 4 MG/2ML IJ SOLN
4.0000 mg | Freq: Four times a day (QID) | INTRAMUSCULAR | Status: DC | PRN
  Administered 2022-10-21 – 2022-10-28 (×6): 4 mg via INTRAVENOUS
  Filled 2022-10-13 (×6): qty 2

## 2022-10-13 MED ORDER — ACETAMINOPHEN 650 MG RE SUPP: 650.0000 mg | Freq: Four times a day (QID) | RECTAL | Status: DC | PRN

## 2022-10-13 MED ORDER — PROCHLORPERAZINE MALEATE 10 MG PO TABS
5.0000 mg | ORAL_TABLET | Freq: Four times a day (QID) | ORAL | Status: DC | PRN
  Administered 2022-10-28: 5 mg via ORAL
  Filled 2022-10-13: qty 1

## 2022-10-13 MED ORDER — LEVETIRACETAM IN NACL 1000 MG/100ML IV SOLN
1000.0000 mg | Freq: Once | INTRAVENOUS | Status: AC
  Administered 2022-10-13: 1000 mg via INTRAVENOUS
  Filled 2022-10-13: qty 100

## 2022-10-13 MED ORDER — ACETAMINOPHEN 325 MG PO TABS
650.0000 mg | ORAL_TABLET | Freq: Four times a day (QID) | ORAL | Status: DC | PRN
  Administered 2022-10-13 – 2022-10-23 (×7): 650 mg via ORAL
  Filled 2022-10-13 (×7): qty 2

## 2022-10-13 MED ORDER — PANCRELIPASE (LIP-PROT-AMYL) 36000-114000 UNITS PO CPEP
72000.0000 [IU] | ORAL_CAPSULE | Freq: Three times a day (TID) | ORAL | Status: DC
  Filled 2022-10-13: qty 2

## 2022-10-13 MED ORDER — PANCRELIPASE (LIP-PROT-AMYL) 36000-114000 UNITS PO CPEP
72000.0000 [IU] | ORAL_CAPSULE | Freq: Three times a day (TID) | ORAL | Status: DC
  Administered 2022-10-13 – 2022-11-04 (×55): 72000 [IU] via ORAL
  Filled 2022-10-13 (×9): qty 2
  Filled 2022-10-13 (×2): qty 6
  Filled 2022-10-13 (×5): qty 2
  Filled 2022-10-13: qty 6
  Filled 2022-10-13 (×2): qty 2
  Filled 2022-10-13: qty 6
  Filled 2022-10-13 (×11): qty 2
  Filled 2022-10-13 (×3): qty 6
  Filled 2022-10-13 (×21): qty 2
  Filled 2022-10-13: qty 6
  Filled 2022-10-13 (×9): qty 2

## 2022-10-13 MED ORDER — INSULIN ASPART 100 UNIT/ML IJ SOLN
0.0000 [IU] | Freq: Three times a day (TID) | INTRAMUSCULAR | Status: DC
  Administered 2022-10-13: 5 [IU] via SUBCUTANEOUS
  Administered 2022-10-13: 3 [IU] via SUBCUTANEOUS
  Administered 2022-10-14 – 2022-10-15 (×5): 5 [IU] via SUBCUTANEOUS
  Administered 2022-10-15: 8 [IU] via SUBCUTANEOUS
  Administered 2022-10-16: 2 [IU] via SUBCUTANEOUS
  Administered 2022-10-16 – 2022-10-17 (×3): 5 [IU] via SUBCUTANEOUS
  Administered 2022-10-17 (×2): 8 [IU] via SUBCUTANEOUS
  Administered 2022-10-18: 5 [IU] via SUBCUTANEOUS
  Administered 2022-10-18 (×2): 8 [IU] via SUBCUTANEOUS
  Administered 2022-10-19 (×2): 5 [IU] via SUBCUTANEOUS
  Administered 2022-10-19: 3 [IU] via SUBCUTANEOUS
  Administered 2022-10-20 (×2): 8 [IU] via SUBCUTANEOUS
  Administered 2022-10-20: 15 [IU] via SUBCUTANEOUS
  Administered 2022-10-21: 8 [IU] via SUBCUTANEOUS
  Administered 2022-10-21: 5 [IU] via SUBCUTANEOUS
  Administered 2022-10-21: 8 [IU] via SUBCUTANEOUS
  Administered 2022-10-22: 2 [IU] via SUBCUTANEOUS
  Administered 2022-10-22 (×2): 8 [IU] via SUBCUTANEOUS
  Administered 2022-10-23: 11 [IU] via SUBCUTANEOUS
  Administered 2022-10-23 (×2): 5 [IU] via SUBCUTANEOUS
  Administered 2022-10-24: 8 [IU] via SUBCUTANEOUS
  Administered 2022-10-24: 11 [IU] via SUBCUTANEOUS
  Administered 2022-10-25 – 2022-10-26 (×4): 5 [IU] via SUBCUTANEOUS
  Administered 2022-10-26 – 2022-10-27 (×2): 8 [IU] via SUBCUTANEOUS
  Administered 2022-10-27 (×2): 5 [IU] via SUBCUTANEOUS
  Administered 2022-10-28: 3 [IU] via SUBCUTANEOUS
  Administered 2022-10-28 (×2): 5 [IU] via SUBCUTANEOUS
  Administered 2022-10-29: 3 [IU] via SUBCUTANEOUS
  Administered 2022-10-29: 2 [IU] via SUBCUTANEOUS
  Administered 2022-10-29: 3 [IU] via SUBCUTANEOUS
  Administered 2022-10-30: 5 [IU] via SUBCUTANEOUS
  Administered 2022-10-30 – 2022-10-31 (×4): 3 [IU] via SUBCUTANEOUS
  Administered 2022-11-01: 8 [IU] via SUBCUTANEOUS
  Administered 2022-11-01: 3 [IU] via SUBCUTANEOUS
  Administered 2022-11-01: 5 [IU] via SUBCUTANEOUS
  Administered 2022-11-02: 3 [IU] via SUBCUTANEOUS
  Administered 2022-11-02 (×2): 2 [IU] via SUBCUTANEOUS
  Administered 2022-11-03: 5 [IU] via SUBCUTANEOUS
  Administered 2022-11-03: 3 [IU] via SUBCUTANEOUS
  Administered 2022-11-03 – 2022-11-04 (×4): 5 [IU] via SUBCUTANEOUS
  Filled 2022-10-13: qty 0.15

## 2022-10-13 MED ORDER — LORAZEPAM 2 MG/ML IJ SOLN
INTRAMUSCULAR | Status: AC
  Filled 2022-10-13: qty 1

## 2022-10-13 NOTE — H&P (Signed)
History and Physical    Patient: Carolyn Hooper ION:[id number] DOB: Feb 23, 1961 DOA: 10/13/2022 DOS: the patient was seen and examined on 10/13/2022 PCP: [name], FNP  Patient coming from: Home  Chief Complaint:  Chief Complaint  Patient presents with   Seizures    Pt had a seizure at home witness by relative    HPI: Carolyn Hooper is a 62 y.o. female with medical history significant of hidradenitis suppurativa complicated by abscess, anxiety, type 2 diabetes, hyperinsulinemia, hypertension, gestational preeclampsia, auditory neuroma, history of pancreatitis, vertigo, seizures, history of lung cancer metastatic to brain who is brought to the emergency department by a relative after having seizures for the past 2 days home present as inability to talk and fast blinking of her eyelids. He denied fever, chills, rhinorrhea, sore throat, wheezing or hemoptysis.  No chest pain, palpitations, diaphoresis, PND, orthopnea or recent pitting edema of the lower extremities.  She can continues to have abdominal pain from her pancreatitis, had a recent episode of emesis, is frequently nauseated, her diarrhea has resolved.  Denied constipation, melena or hematochezia.  No flank pain, dysuria, frequency or hematuria.  No polyuria, polydipsia, polyphagia or blurred vision.   Lab work: Her urine analysis was hazy with ketones of 20 mg deciliter, small leukocyte esterase and rare bacteria microscopic examination.  CBC showed a white count of 4.5, hemoglobin 10.8 g/dL and platelets 244.  Lipase was 297 units/L.  CMP showed a sodium 135, potassium of 3.4, chloride of 96 and CO2 of 22 mmol/L with an anion gap of 17.  Renal function was normal.  Glucose 111 mg/dL.  Total protein 5.9 and albumin 2.6 g/dL.  The rest of the CMP measurements were normal after calcium correction.  Imaging: CT head without contrast showing progression of left frontal metastasis associated with vasogenic edema since 2 weeks ago.  Mild  new mass effect on the left lateral ventricle, but no midline shift or other complicating features.   ED course: Initial vital signs were temperature 97.7 F, pulse 90, respiration 18, BP 124/67 mmHg O2 sat 100% on room air.  The patient received Keppra 1000 mg IVPB and was started on dexamethasone 4 mg IVP every 6 hours per Dr. Liana Gerold recommendation.  Review of Systems: As mentioned in the history of present illness. All other systems reviewed and are negative. Past Medical History:  Diagnosis Date   Abscess    hydroadenitis superativa   Anxiety    DM (diabetes mellitus) (HCC)    Hyperinsulinemia    Hypertension 1983   gestational - pre-eclampsia   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[facility])    Neuroma    audiotry neuroma   Pancreatitis    Seizure (HCC)    Vertigo    Past Surgical History:  Procedure Laterality Date   ABDOMINAL HYSTERECTOMY  2006   remaining left ovary   BALLOON DILATION N/A 09/26/2022   Procedure: BALLOON DILATION;  Surgeon: Lemar Lofty., MD;  Location: [facility];  Service: Gastroenterology;  Laterality: N/A;   BIOPSY  09/26/2022   Procedure: BIOPSY;  Surgeon: Meridee Score Netty Starring., MD;  Location: [facility];  Service: Gastroenterology;;   BRONCHIAL BIOPSY  02/19/2022   Procedure: BRONCHIAL BIOPSIES;  Surgeon: Josephine Igo, DO;  Location: [facility];  Service: Pulmonary;;   BRONCHIAL BRUSHINGS  02/19/2022   Procedure: BRONCHIAL BRUSHINGS;  Surgeon: Josephine Igo, DO;  Location: [facility];  Service: Pulmonary;;   BRONCHIAL NEEDLE ASPIRATION BIOPSY  02/19/2022   Procedure:  BRONCHIAL NEEDLE ASPIRATION BIOPSIES;  Surgeon: Josephine Igo, DO;  Location: [facility];  Service: Pulmonary;;   ESOPHAGOGASTRODUODENOSCOPY (EGD) WITH PROPOFOL N/A 09/26/2022   Procedure: ESOPHAGOGASTRODUODENOSCOPY (EGD) WITH PROPOFOL;  Surgeon: Meridee Score Netty Starring., MD;  Location: [facility];  Service: Gastroenterology;  Laterality: N/A;   IR IMAGING GUIDED PORT  INSERTION  03/29/2022   PANCREATIC STENT PLACEMENT  09/26/2022   Procedure: PANCREATIC STENT PLACEMENT;  Surgeon: Meridee Score Netty Starring., MD;  Location: [facility];  Service: Gastroenterology;;  axios   SHOULDER SURGERY Right 1960s   UPPER ESOPHAGEAL ENDOSCOPIC ULTRASOUND (EUS) N/A 09/26/2022   Procedure: UPPER ESOPHAGEAL ENDOSCOPIC ULTRASOUND (EUS);  Surgeon: Lemar Lofty., MD;  Location: [facility];  Service: Gastroenterology;  Laterality: N/A;  For cyst drainage probably will need stent   Social History:  reports that she quit smoking about 8 months ago. Her smoking use included cigarettes. She has never used smokeless tobacco. She reports current alcohol use. She reports that she does not use drugs.  Allergies  Allergen Reactions   Adhesive [Tape] Dermatitis   Statins Itching and Other (See Comments)    Myalgias, also    Family History  Problem Relation Age of Onset   COPD Mother    Heart disease Father    Diabetes Father    Kidney disease Father    Arthritis Father        RA   Liver disease Father     Prior to Admission medications   Medication Sig Start Date End Date Taking? Authorizing Provider  albuterol (VENTOLIN HFA) 108 (90 Base) MCG/ACT inhaler Inhale 2 puffs into the lungs every 6 (six) hours as needed. Patient not taking: Reported on 10/05/2022 01/08/21   [name], FNP  clonazePAM (KLONOPIN) 0.5 MG tablet Take 1 tablet (0.5 mg total) by mouth 2 (two) times daily. Patient taking differently: Take 0.5 mg by mouth 2 (two) times daily as needed for anxiety. 08/30/22   [name], FNP  Ensure Max Protein (ENSURE MAX PROTEIN) LIQD Take 330 mLs (11 oz total) by mouth 2 (two) times daily. 09/11/22   Hongalgi, Maximino Greenland, MD  famotidine (PEPCID) 20 MG tablet Take 1 tablet (20 mg total) by mouth 2 (two) times daily. 08/22/22   [name], MD  fluconazole (DIFLUCAN) 150 MG tablet Take 1 tablet by mouth every 3 days as needed (yeast infection). 08/29/22    [name], FNP  fluconazole (DIFLUCAN) 200 MG tablet Take 4 tablets (800 mg total) by mouth daily for 7 days. 10/09/22 10/16/22  Danelle Earthly, MD  folic acid (FOLVITE) 1 MG tablet Take 1 tablet by mouth once daily 09/16/22   Heilingoetter, [name], PA-C  furosemide (LASIX) 20 MG tablet Take 1 tablet (20 mg total) by mouth daily as needed for fluid or edema. 09/20/22   [name], FNP  hydrOXYzine (ATARAX) 10 MG tablet Take 1 tablet (10 mg) by mouth 3 times daily as needed for anxiety. 09/30/22   Kathlen Mody, MD  insulin glargine (LANTUS SOLOSTAR) 100 UNIT/ML Solostar Pen Inject 15 Units into the skin daily. 09/12/22   Hongalgi, Maximino Greenland, MD  Insulin Pen Needle (PEN NEEDLES) 32G X 4 MM MISC Use to inject insulin daily as directed. 07/09/22   [name], FNP  levETIRAcetam (KEPPRA) 1000 MG tablet Take 1 tablet (1,000 mg total) by mouth 2 (two) times daily. 07/31/22   [name], MD  levofloxacin (LEVAQUIN) 750 MG tablet Take 1 tablet (750 mg total) by  mouth daily. Patient not taking: Reported on 10/05/2022 09/26/22 10/30/22  Kathlen Mody, MD  lidocaine (XYLOCAINE) 5 % ointment Apply topically as needed. Patient taking differently: Apply 1 Application topically as needed for mild pain. 05/30/22   [name], FNP  lidocaine-prilocaine (EMLA) cream Apply to port prior to access Patient taking differently: Apply 1 Application topically daily as needed (For port access). 04/25/22   [name], MD  lipase/protease/amylase (CREON) 36000 UNITS CPEP capsule Take 36,000-72,000 Units by mouth as directed. Take 2 Capsules by mouth (72,000 units) with meals and Take 1 Capsule by mouth (36,000 units) with snacks.    [provider]  metFORMIN (GLUCOPHAGE) 1000 MG tablet Take 1 tablet (1,000 mg total) by mouth 2 (two) times daily. 09/16/22   Hawks, [name] A, FNP  MYLANTA MAXIMUM STRENGTH 400-400-40 MG/5ML suspension Take 15 mLs by mouth 2 (two) times daily.    [provider]  naloxone [facility]) nasal spray 4 mg/0.1 mL Place 1 spray into the nose daily as needed (For overdose). 09/30/22   [provider]  ondansetron (ZOFRAN) 4 MG tablet Take 1 tablet (4 mg total) by mouth every 6 (six) hours as needed for nausea. Patient taking differently: Take 4-8 mg by mouth every 6 (six) hours as needed for nausea. 07/17/22   [name], [name], MD  oxyCODONE (OXY IR/ROXICODONE) 5 MG immediate release tablet Take 1 tablet (5 mg total) by mouth every 8 (eight) hours as needed for moderate pain or severe pain. 09/20/22   [name], FNP  pantoprazole (PROTONIX) 40 MG tablet Take 1 tablet (40 mg total) by mouth daily. 09/13/22   Hongalgi, Maximino Greenland, MD  polyethylene glycol powder (GLYCOLAX/MIRALAX) 17 GM/SCOOP powder Take 17 g by mouth daily as needed for mild constipation. 09/30/22   Kathlen Mody, MD  prochlorperazine (COMPAZINE) 10 MG tablet Take 1 tablet (10 mg total) by mouth every 6 (six) hours as needed. Patient taking differently: Take 10 mg by mouth every 6 (six) hours as needed for nausea or vomiting. 05/30/22   Heilingoetter, [name], PA-C  triamcinolone ointment (KENALOG) 0.5 % Apply topically to affected area twice daily as directed, Patient taking differently: Apply 1 Application topically 2 (two) times daily as needed (for rash). 07/30/22   Johnson, Clanford L, MD  TYLENOL 500 MG tablet Take 1,000 mg by mouth every 6 (six) hours as needed for mild pain or headache.    [provider]  valACYclovir (VALTREX) 500 MG tablet Take 1 tablet (500 mg) by mouth 2 times daily. Patient taking differently: Take 500 mg by mouth daily as needed (for cold sores). 09/23/22   [name], FNP    Physical Exam: Vitals:   10/13/22 0426 10/13/22 0428 10/13/22 0429 10/13/22 0630  BP:  124/67  113/61  Pulse:  90  77  Resp:  18  (!) 25  SpO2: 100% 100%  95%  Weight:   54.4 kg   Height:   5\' 8"  (1.727 m)    Physical Exam Vitals and nursing note  reviewed.  Constitutional:      General: She is awake. She is not in acute distress.    Appearance: She is underweight. She is ill-appearing. She is not toxic-appearing.  HENT:     Head: Normocephalic.     Nose: No rhinorrhea.     Mouth/Throat:     Mouth: Mucous membranes are moist.  Eyes:     General: No scleral icterus.    Pupils: Pupils  are equal, round, and reactive to light.  Neck:     Vascular: No JVD.  Cardiovascular:     Rate and Rhythm: Normal rate and regular rhythm.     Heart sounds: S1 normal and S2 normal.  Pulmonary:     Effort: Pulmonary effort is normal.     Breath sounds: Normal breath sounds.  Abdominal:     General: Bowel sounds are normal.     Palpations: Abdomen is soft.  Musculoskeletal:     Cervical back: Neck supple.     Right lower leg: No edema.     Left lower leg: No edema.  Skin:    General: Skin is warm and dry.  Neurological:     General: No focal deficit present.     Mental Status: She is alert and oriented to person, place, and time.  Psychiatric:        Mood and Affect: Mood normal.        Behavior: Behavior normal. Behavior is cooperative.    Data Reviewed:  Results are pending, will review when available.  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 Will continue dexamethasone 4 mg IVP every 6 hours. Continue Keppra 1000 mg p.o. twice daily. Seizure precautions/neurochecks. Neuro-oncology will evaluate in a.m.  Active Problems:   Abdominal pain In the setting of recent:   Acute pancreatitis Continue IV fluids. Analgesics as needed. Antiemetics as needed. Pantoprazole 40 mg p.o. daily. 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 daily per ID. Resume Levaquin 500 mg p.o. daily.    Type 2  diabetes mellitus (HCC) Last HbA1c was 8.4% on 09/04/2022. Carbohydrate modified diet. CBG monitoring with [facility].    GAD (generalized anxiety disorder) Continue clonazepam 0.5 mg p.o. twice daily as needed.    GERD (gastroesophageal reflux disease) On PPI as above. Antiacid, H2 blocker as needed.    Hypokalemia K. Dur 40 mEq p.o. daily. Magnesium sulfate 2 g IVPB x 1. Follow-up potassium level in the morning.     Advance Care Planning:   Code Status: Full Code   Consults:   Family Communication:   Severity of Illness: The appropriate patient status for this patient is OBSERVATION. Observation status is judged to be reasonable and necessary in order to provide the required intensity of service to ensure the patient's safety. The patient's presenting symptoms, physical exam findings, and initial radiographic and laboratory data in the context of their medical condition is felt to place them at decreased risk for further clinical deterioration. Furthermore, it is anticipated that the patient will be medically stable for discharge from the hospital within 2 midnights of admission.   Author: Bobette Mo, MD 10/13/2022 7:44 AM  For on call review [email or web address].   This document was prepared using Dragon voice recognition software and may contain some unintended transcription errors.

## 2022-10-13 NOTE — Plan of Care (Signed)

## 2022-10-13 NOTE — ED Notes (Signed)
ED TO INPATIENT HANDOFF REPORT  ED Nurse Name and Phone #: Suzanna Obey [phone]  S Name/Age/Gender Carolyn Hooper 62 y.o. female Room/Bed: WA12/WA12  Code Status   Code Status: Full Code  Home/SNF/Other Home Patient oriented to: self, place, time, and situation Is this baseline? Yes   Triage Complete: Triage complete  Chief Complaint Seizures (HCC) [R56.9]  Triage Note No notes on file   Allergies Allergies  Allergen Reactions   Adhesive [Tape] Dermatitis   Statins Itching and Other (See Comments)    Myalgias, also    Level of Care/Admitting Diagnosis ED Disposition     ED Disposition  Admit   Condition  --   Comment  Hospital Area: [facility] [[id number]]  Level of Care: Telemetry [5]  Admit to tele based on following criteria: Monitor for Ischemic changes  May place patient in observation at [facility] or [facility] if equivalent level of care is available:: No  Covid Evaluation: Asymptomatic - no recent exposure (last 10 days) testing not required  Diagnosis: Seizures [facility]) [205091]  Admitting Physician: Bobette Mo [[id number]]  Attending Physician: Bobette Mo [[id number]]          B Medical/Surgery History Past Medical History:  Diagnosis Date   Abscess    hydroadenitis superativa   Anxiety    DM (diabetes mellitus) (HCC)    Hyperinsulinemia    Hypertension 1983   gestational - pre-eclampsia   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[facility]    Neuroma    audiotry neuroma   Pancreatitis    Seizure (HCC)    Vertigo    Past Surgical History:  Procedure Laterality Date   ABDOMINAL HYSTERECTOMY  2006   remaining left ovary   BALLOON DILATION N/A 09/26/2022   Procedure: BALLOON DILATION;  Surgeon: Lemar Lofty., MD;  Location: [facility];  Service: Gastroenterology;  Laterality: N/A;   BIOPSY  09/26/2022   Procedure: BIOPSY;  Surgeon: Meridee Score Netty Starring., MD;  Location: [facility];  Service:  Gastroenterology;;   BRONCHIAL BIOPSY  02/19/2022   Procedure: BRONCHIAL BIOPSIES;  Surgeon: Josephine Igo, DO;  Location: [facility];  Service: Pulmonary;;   BRONCHIAL BRUSHINGS  02/19/2022   Procedure: BRONCHIAL BRUSHINGS;  Surgeon: Josephine Igo, DO;  Location: [facility];  Service: Pulmonary;;   BRONCHIAL NEEDLE ASPIRATION BIOPSY  02/19/2022   Procedure: BRONCHIAL NEEDLE ASPIRATION BIOPSIES;  Surgeon: Josephine Igo, DO;  Location: [facility];  Service: Pulmonary;;   ESOPHAGOGASTRODUODENOSCOPY (EGD) WITH PROPOFOL N/A 09/26/2022   Procedure: ESOPHAGOGASTRODUODENOSCOPY (EGD) WITH PROPOFOL;  Surgeon: Lemar Lofty., MD;  Location: [facility];  Service: Gastroenterology;  Laterality: N/A;   IR IMAGING GUIDED PORT INSERTION  03/29/2022   PANCREATIC STENT PLACEMENT  09/26/2022   Procedure: PANCREATIC STENT PLACEMENT;  Surgeon: Meridee Score Netty Starring., MD;  Location: [facility];  Service: Gastroenterology;;  axios   SHOULDER SURGERY Right 1960s   UPPER ESOPHAGEAL ENDOSCOPIC ULTRASOUND (EUS) N/A 09/26/2022   Procedure: UPPER ESOPHAGEAL ENDOSCOPIC ULTRASOUND (EUS);  Surgeon: Lemar Lofty., MD;  Location: [facility];  Service: Gastroenterology;  Laterality: N/A;  For cyst drainage probably will need stent     A IV Location/Drains/Wounds Patient Lines/Drains/Airways Status     Active Line/Drains/Airways     Name Placement date Placement time Site Days   Implanted Port Right Chest --  --  Chest  --   GI Stent 09/26/22  1611  --  17   GI Stent 09/26/22  1625  --  17            Intake/Output Last 24 hours  Intake/Output Summary (Last 24 hours) at 10/13/2022 1314 Last data filed at 10/13/2022 9562 Gross per 24 hour  Intake 99 ml  Output --  Net 99 ml    Labs/Imaging Results for orders placed or performed during the hospital encounter of 10/13/22 (from the past 48 hour(s))  CBG monitoring, ED     Status: Abnormal   Collection Time: 10/13/22  4:22 AM   Result Value Ref Range   Glucose-Capillary 105 (H) 70 - 99 mg/dL    Comment: Glucose reference range applies only to samples taken after fasting for at least 8 hours.  Comprehensive metabolic panel     Status: Abnormal   Collection Time: 10/13/22  4:25 AM  Result Value Ref Range   Sodium 135 135 - 145 mmol/L   Potassium 3.4 (L) 3.5 - 5.1 mmol/L   Chloride 96 (L) 98 - 111 mmol/L   CO2 22 22 - 32 mmol/L   Glucose, Bld 111 (H) 70 - 99 mg/dL    Comment: Glucose reference range applies only to samples taken after fasting for at least 8 hours.   BUN 10 8 - 23 mg/dL   Creatinine, Ser 1.30 0.44 - 1.00 mg/dL   Calcium 8.3 (L) 8.9 - 10.3 mg/dL   Total Protein 5.9 (L) 6.5 - 8.1 g/dL   Albumin 2.6 (L) 3.5 - 5.0 g/dL   AST 31 15 - 41 U/L   ALT 18 0 - 44 U/L   Alkaline Phosphatase 78 38 - 126 U/L   Total Bilirubin 1.2 0.3 - 1.2 mg/dL   GFR, Estimated >86 >57 mL/min    Comment: (NOTE) Calculated using the CKD-EPI Creatinine Equation (2021)    Anion gap 17 (H) 5 - 15    Comment: Performed at [facility], [address], [place], [place] [address]  CBC with Differential     Status: Abnormal   Collection Time: 10/13/22  4:25 AM  Result Value Ref Range   WBC 4.5 4.0 - 10.5 K/uL   RBC 3.63 (L) 3.87 - 5.11 MIL/uL   Hemoglobin 10.8 (L) 12.0 - 15.0 g/dL   HCT 29.5 (L) 28.4 - 13.2 %   MCV 98.6 80.0 - 100.0 fL   MCH 29.8 26.0 - 34.0 pg   MCHC 30.2 30.0 - 36.0 g/dL   RDW 44.0 (H) 10.2 - 72.5 %   Platelets 186 150 - 400 K/uL   nRBC 0.0 0.0 - 0.2 %   Neutrophils Relative % 61 %   Neutro Abs 2.8 1.7 - 7.7 K/uL   Lymphocytes Relative 20 %   Lymphs Abs 0.9 0.7 - 4.0 K/uL   Monocytes Relative 13 %   Monocytes Absolute 0.6 0.1 - 1.0 K/uL   Eosinophils Relative 1 %   Eosinophils Absolute 0.1 0.0 - 0.5 K/uL   Basophils Relative 1 %   Basophils Absolute 0.1 0.0 - 0.1 K/uL   Immature Granulocytes 4 %   Abs Immature Granulocytes 0.19 (H) 0.00 - 0.07 K/uL    Comment: Performed at  [facility], [address] [place], [place] [address]  Lipase, blood     Status: Abnormal   Collection Time: 10/13/22  4:25 AM  Result Value Ref Range   Lipase 297 (H) 11 - 51 U/L    Comment: Performed at [facility], [address] [place], [place] [address]  CBG monitoring, ED  Status: None   Collection Time: 10/13/22  6:52 AM  Result Value Ref Range   Glucose-Capillary 97 70 - 99 mg/dL    Comment: Glucose reference range applies only to samples taken after fasting for at least 8 hours.  Urinalysis, Routine w reflex microscopic -Urine, Clean Catch     Status: Abnormal   Collection Time: 10/13/22  8:30 AM  Result Value Ref Range   Color, Urine YELLOW YELLOW   APPearance HAZY (A) CLEAR   Specific Gravity, Urine 1.009 1.005 - 1.030   pH 5.0 5.0 - 8.0   Glucose, UA NEGATIVE NEGATIVE mg/dL   Hgb urine dipstick NEGATIVE NEGATIVE   Bilirubin Urine NEGATIVE NEGATIVE   Ketones, ur 20 (A) NEGATIVE mg/dL   Protein, ur NEGATIVE NEGATIVE mg/dL   Nitrite NEGATIVE NEGATIVE   Leukocytes,Ua SMALL (A) NEGATIVE   RBC / HPF 0-5 0 - 5 RBC/hpf   WBC, UA 21-50 0 - 5 WBC/hpf   Bacteria, UA RARE (A) NONE SEEN   Squamous Epithelial / HPF 0-5 0 - 5 /HPF   Mucus PRESENT    Hyaline Casts, UA PRESENT    Non Squamous Epithelial 0-5 (A) NONE SEEN    Comment: Performed at [facility], [address] [place], [place] [address]  CBG monitoring, ED     Status: Abnormal   Collection Time: 10/13/22 12:42 PM  Result Value Ref Range   Glucose-Capillary 186 (H) 70 - 99 mg/dL    Comment: Glucose reference range applies only to samples taken after fasting for at least 8 hours.   CT Head Wo Contrast  Result Date: 10/13/2022 CLINICAL DATA:  62 year old female with increasing seizures. Altered mental status. Lung cancer with brain metastases. EXAM: CT HEAD WITHOUT CONTRAST TECHNIQUE: Contiguous axial images were obtained from the base of the skull  through the vertex without intravenous contrast. RADIATION DOSE REDUCTION: This exam was performed according to the departmental dose-optimization program which includes automated exposure control, adjustment of the mA and/or kV according to patient size and/or use of iterative reconstruction technique. COMPARISON:  Brain MRI 09/29/2022 and earlier. FINDINGS: Brain: Posterior left frontal lobe middle and superior frontal gyrus vasogenic edema related to known cortically based metastasis (series 2, image 27) has progressed since 09/29/2022 (compare coronal image 44 today to series 15, image 17 at that time) and there is new mild mass effect on the left lateral ventricle. No acute intracranial hemorrhage identified. No ventriculomegaly. No midline shift. Normal basilar cisterns. No other areas of vasogenic edema by CT. No evidence of superimposed acute cortically based infarct. Vascular: No suspicious intracranial vascular hyperdensity. Skull: No acute or suspicious osseous lesion identified. Sinuses/Orbits: Visualized paranasal sinuses and mastoids are stable and well aerated. Other: No acute orbit or scalp soft tissue finding. IMPRESSION: Progression of left frontal metastasis associated vasogenic edema since 09/29/2022. Mild new mass effect on the left lateral ventricle. But no midline shift or other complicating features. Electronically Signed   By: Odessa Fleming M.D.   On: 10/13/2022 06:34    Pending Labs Unresulted Labs (From admission, onward)     Start     Ordered   10/14/22 0500  CBC  Tomorrow morning,   R        10/13/22 0813   10/14/22 0500  Comprehensive metabolic panel  Tomorrow morning,   R        10/13/22 0813            Vitals/Pain Today's Vitals   10/13/22 1000  10/13/22 1200 10/13/22 1230 10/13/22 1314  BP: 120/72 125/69 130/63   Pulse: 78 64 68   Resp: 15 (!) 22 20   Temp:  (!) 97.5 F (36.4 C)    TempSrc:  Oral    SpO2: 99% 100% 96%   Weight:      Height:      PainSc:    7      Isolation Precautions No active isolations  Medications Medications  LORazepam (ATIVAN) 2 MG/ML injection (  Not Given 10/13/22 0735)  lactated ringers infusion ( Intravenous New Bag/Given 10/13/22 0754)  dexamethasone (DECADRON) injection 4 mg (4 mg Intravenous Given 10/13/22 1256)  acetaminophen (TYLENOL) tablet 650 mg (has no administration in time range)    Or  acetaminophen (TYLENOL) suppository 650 mg (has no administration in time range)  ondansetron (ZOFRAN) tablet 4 mg (has no administration in time range)    Or  ondansetron (ZOFRAN) injection 4 mg (has no administration in time range)  clonazePAM (KLONOPIN) tablet 0.5 mg (has no administration in time range)  folic acid (FOLVITE) tablet 1 mg (1 mg Oral Given by Other 10/13/22 1141)  fluconazole (DIFLUCAN) tablet 800 mg (has no administration in time range)  levETIRAcetam (KEPPRA) tablet 1,000 mg (1,000 mg Oral Given by Other 10/13/22 1141)  lipase/protease/amylase (CREON) capsule 36,000-72,000 Units (has no administration in time range)  oxyCODONE (Oxy IR/ROXICODONE) immediate release tablet 5 mg (5 mg Oral Given 10/13/22 1209)  pantoprazole (PROTONIX) EC tablet 40 mg (40 mg Oral Given by Other 10/13/22 1142)  prochlorperazine (COMPAZINE) tablet 5 mg (has no administration in time range)  valACYclovir (VALTREX) tablet 500 mg (has no administration in time range)  insulin aspart (novoLOG) injection 0-15 Units (3 Units Subcutaneous Given 10/13/22 1256)  levETIRAcetam (KEPPRA) IVPB 1000 mg/100 mL premix (0 mg Intravenous Stopped 10/13/22 0842)    Mobility walks with device     Focused Assessments N/A   R Recommendations: See Admitting Provider Note  Report given to:   Additional Notes: N/A

## 2022-10-13 NOTE — ED Provider Notes (Signed)
[place] [facility]GENCY DEPARTMENT AT [facility] Provider Note   CSN: [id number] Arrival date & time: 10/13/22  0358     History  Chief Complaint  Patient presents with   Seizures    Pt had a seizure at home witness by relative     RAKEB BUCKNAM is a 62 y.o. female.  The history is provided by the patient and medical records.  Seizures GENISYS MACKALL is a 62 y.o. female who presents to the Emergency Department complaining of seizure.  She presents the emergency department by EMS for evaluation following a seizure event at home.  Patient describes these seizures as a fluttering of her eyelids and inability to speak.  She has complete awareness throughout these events.  She reports 2 seizures in the last 2 days.  She also reports that she recently had a fall.  She did not injure herself from the fall.  She also reports difficulty eating for the last 5 days due to pancreatitis.  No fever.  She does report occasional vomiting.  She has experienced diarrhea but this is now gone.  She currently lives at home with her mother.  She is compliant with her home medications. 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      Home Medications Prior to Admission medications   Medication Sig Start Date End Date Taking? Authorizing Provider  albuterol (VENTOLIN HFA) 108 (90 Base) MCG/ACT inhaler Inhale 2 puffs into the lungs every 6 (six) hours as needed. Patient not taking: Reported on 10/05/2022 01/08/21   [name], FNP  clonazePAM (KLONOPIN) 0.5 MG tablet Take 1 tablet (0.5 mg total) by mouth 2 (two) times daily. Patient taking differently: Take 0.5 mg by mouth 2 (two) times daily as needed for anxiety. 08/30/22   [name], FNP  Ensure Max Protein (ENSURE MAX PROTEIN) LIQD Take 330 mLs (11 oz total) by mouth 2 (two) times daily. 09/11/22   Hongalgi, Maximino Greenland, MD  famotidine (PEPCID) 20 MG tablet Take 1 tablet (20 mg total)  by mouth 2 (two) times daily. 08/22/22   [name], MD  fluconazole (DIFLUCAN) 150 MG tablet Take 1 tablet by mouth every 3 days as needed (yeast infection). 08/29/22   [name], FNP  fluconazole (DIFLUCAN) 200 MG tablet Take 4 tablets (800 mg total) by mouth daily for 7 days. 10/09/22 10/16/22  Danelle Earthly, MD  folic acid (FOLVITE) 1 MG tablet Take 1 tablet by mouth once daily 09/16/22   Heilingoetter, [name], PA-C  furosemide (LASIX) 20 MG tablet Take 1 tablet (20 mg total) by mouth daily as needed for fluid or edema. 09/20/22   [name], FNP  hydrOXYzine (ATARAX) 10 MG tablet Take 1 tablet (10 mg) by mouth 3 times daily as needed for anxiety. 09/30/22   Kathlen Mody, MD  insulin glargine (LANTUS SOLOSTAR) 100 UNIT/ML Solostar Pen Inject 15 Units into the skin daily. 09/12/22   Hongalgi, Maximino Greenland, MD  Insulin Pen Needle (PEN NEEDLES) 32G X 4 MM MISC Use to inject insulin daily as directed. 07/09/22   [name], FNP  levETIRAcetam (KEPPRA) 1000 MG tablet Take 1 tablet (1,000 mg total) by mouth 2 (two) times daily. 07/31/22   [name], MD  levofloxacin (LEVAQUIN) 750 MG tablet Take 1 tablet (750 mg total) by mouth daily. Patient not taking: Reported on 10/05/2022 09/26/22 10/30/22  Kathlen Mody, MD  lidocaine (XYLOCAINE) 5 %  ointment Apply topically as needed. Patient taking differently: Apply 1 Application topically as needed for mild pain. 05/30/22   [name], FNP  lidocaine-prilocaine (EMLA) cream Apply to port prior to access Patient taking differently: Apply 1 Application topically daily as needed (For port access). 04/25/22   [name], MD  lipase/protease/amylase (CREON) 36000 UNITS CPEP capsule Take 36,000-72,000 Units by mouth as directed. Take 2 Capsules by mouth (72,000 units) with meals and Take 1 Capsule by mouth (36,000 units) with snacks.    [provider]  metFORMIN (GLUCOPHAGE) 1000 MG tablet Take 1 tablet (1,000 mg total) by  mouth 2 (two) times daily. 09/16/22   Hawks, [name] A, FNP  MYLANTA MAXIMUM STRENGTH 400-400-40 MG/5ML suspension Take 15 mLs by mouth 2 (two) times daily.    [provider]  naloxone [facility]) nasal spray 4 mg/0.1 mL Place 1 spray into the nose daily as needed (For overdose). 09/30/22   [provider]  ondansetron (ZOFRAN) 4 MG tablet Take 1 tablet (4 mg total) by mouth every 6 (six) hours as needed for nausea. Patient taking differently: Take 4-8 mg by mouth every 6 (six) hours as needed for nausea. 07/17/22   [name], [name], MD  oxyCODONE (OXY IR/ROXICODONE) 5 MG immediate release tablet Take 1 tablet (5 mg total) by mouth every 8 (eight) hours as needed for moderate pain or severe pain. 09/20/22   [name], FNP  pantoprazole (PROTONIX) 40 MG tablet Take 1 tablet (40 mg total) by mouth daily. 09/13/22   Hongalgi, Maximino Greenland, MD  polyethylene glycol powder (GLYCOLAX/MIRALAX) 17 GM/SCOOP powder Take 17 g by mouth daily as needed for mild constipation. 09/30/22   Kathlen Mody, MD  prochlorperazine (COMPAZINE) 10 MG tablet Take 1 tablet (10 mg total) by mouth every 6 (six) hours as needed. Patient taking differently: Take 10 mg by mouth every 6 (six) hours as needed for nausea or vomiting. 05/30/22   Heilingoetter, [name], PA-C  triamcinolone ointment (KENALOG) 0.5 % Apply topically to affected area twice daily as directed, Patient taking differently: Apply 1 Application topically 2 (two) times daily as needed (for rash). 07/30/22   Johnson, Clanford L, MD  TYLENOL 500 MG tablet Take 1,000 mg by mouth every 6 (six) hours as needed for mild pain or headache.    [provider]  valACYclovir (VALTREX) 500 MG tablet Take 1 tablet (500 mg) by mouth 2 times daily. Patient taking differently: Take 500 mg by mouth daily as needed (for cold sores). 09/23/22   [name], FNP      Allergies    Adhesive [tape] and Statins    Review of Systems   Review of Systems   Neurological:  Positive for seizures.  All other systems reviewed and are negative.   Physical Exam Updated Vital Signs BP 113/61   Pulse 77   Resp (!) 25   Ht 5\' 8"  (1.727 m)   Wt 54.4 kg   SpO2 95%   BMI 18.25 kg/m  Physical Exam Vitals and nursing note reviewed.  Constitutional:      Appearance: She is well-developed.  HENT:     Head: Normocephalic and atraumatic.     Comments: Lacy erythematous changes to cheeks Cardiovascular:     Rate and Rhythm: Normal rate and regular rhythm.     Heart sounds: No murmur heard. Pulmonary:     Effort: Pulmonary effort is normal. No respiratory distress.     Breath sounds: Normal breath sounds.  Abdominal:     Palpations: Abdomen is soft.     Tenderness: There is no abdominal tenderness. There is no guarding or rebound.  Musculoskeletal:        General: No tenderness.  Skin:    General: Skin is warm and dry.  Neurological:     Mental Status: She is alert and oriented to person, place, and time.     Comments: No asymmetry of facial movements.  Slow speech.  Global weakness, perhaps greater weakness in the left upper and left upper extremity compared to the right although this is difficult to ascertain.  There is mild flattening of the right nasolabial fold.  Psychiatric:        Behavior: Behavior normal.     ED Results / Procedures / Treatments   Labs (all labs ordered are listed, but only abnormal results are displayed) Labs Reviewed  COMPREHENSIVE METABOLIC PANEL - Abnormal; Notable for the following components:      Result Value   Potassium 3.4 (*)    Chloride 96 (*)    Glucose, Bld 111 (*)    Calcium 8.3 (*)    Total Protein 5.9 (*)    Albumin 2.6 (*)    Anion gap 17 (*)    All other components within normal limits  CBC WITH DIFFERENTIAL/PLATELET - Abnormal; Notable for the following components:   RBC 3.63 (*)    Hemoglobin 10.8 (*)    HCT 35.8 (*)    RDW 17.2 (*)    Abs Immature Granulocytes 0.19 (*)    All  other components within normal limits  LIPASE, BLOOD - Abnormal; Notable for the following components:   Lipase 297 (*)    All other components within normal limits  CBG MONITORING, ED - Abnormal; Notable for the following components:   Glucose-Capillary 105 (*)    All other components within normal limits  URINALYSIS, ROUTINE W REFLEX MICROSCOPIC  CBG MONITORING, ED    EKG EKG Interpretation Date/Time:  Sunday October 13 2022 05:12:55 EDT Ventricular Rate:  77 PR Interval:  134 QRS Duration:  100 QT Interval:  425 QTC Calculation: 481 R Axis:   -68  Text Interpretation: Sinus rhythm Left anterior fascicular block Confirmed by Tilden Fossa [id number]) on 10/13/2022 5:15:23 AM  Radiology CT Head Wo Contrast  Result Date: 10/13/2022 CLINICAL DATA:  62 year old female with increasing seizures. Altered mental status. Lung cancer with brain metastases. EXAM: CT HEAD WITHOUT CONTRAST TECHNIQUE: Contiguous axial images were obtained from the base of the skull through the vertex without intravenous contrast. RADIATION DOSE REDUCTION: This exam was performed according to the departmental dose-optimization program which includes automated exposure control, adjustment of the mA and/or kV according to patient size and/or use of iterative reconstruction technique. COMPARISON:  Brain MRI 09/29/2022 and earlier. FINDINGS: Brain: Posterior left frontal lobe middle and superior frontal gyrus vasogenic edema related to known cortically based metastasis (series 2, image 27) has progressed since 09/29/2022 (compare coronal image 44 today to series 15, image 17 at that time) and there is new mild mass effect on the left lateral ventricle. No acute intracranial hemorrhage identified. No ventriculomegaly. No midline shift. Normal basilar cisterns. No other areas of vasogenic edema by CT. No evidence of superimposed acute cortically based infarct. Vascular: No suspicious intracranial vascular hyperdensity. Skull: No  acute or suspicious osseous lesion identified. Sinuses/Orbits: Visualized paranasal sinuses and mastoids are stable and well aerated. Other: No acute orbit or scalp soft tissue finding. IMPRESSION: Progression of left frontal  metastasis associated vasogenic edema since 09/29/2022. Mild new mass effect on the left lateral ventricle. But no midline shift or other complicating features. Electronically Signed   By: Odessa Fleming M.D.   On: 10/13/2022 06:34    Procedures Procedures    Medications Ordered in ED Medications  LORazepam (ATIVAN) 2 MG/ML injection (has no administration in time range)  levETIRAcetam (KEPPRA) IVPB 1000 mg/100 mL premix (has no administration in time range)  lactated ringers infusion (has no administration in time range)  dexamethasone (DECADRON) injection 4 mg (has no administration in time range)    ED Course/ Medical Decision Making/ A&P                             Medical Decision Making Amount and/or Complexity of Data Reviewed Labs: ordered. Radiology: ordered.  Risk Prescription drug management. Decision regarding hospitalization.   Patient with history of metastatic non-small cell lung cancer to the brain here for evaluation of reported seizure activity.  No witnessed seizure activity on examination although she does have a history of focal seizures.  No missed doses of her Keppra.  Will provide her morning dose of Keppra.  CT head does demonstrate new vasogenic edema compared to her recent MRI.  Discussed with Dr. Barbaraann Cao with radiation oncology-recommendation to start with Decadron 4 mg every 6 hours.  Given patient's significant functional decline, poor oral intake do not feel she is safe for discharge home at this time.  Discussed with patient findings of studies and recommendation for admission and she is in agreement treatment plan.  Hospitalist consulted for admission.        Final Clinical Impression(s) / ED Diagnoses Final diagnoses:  Seizure  [facility])    Rx / DC Orders ED Discharge Orders     None         Tilden Fossa, MD 10/13/22 [id number]

## 2022-10-14 ENCOUNTER — Ambulatory Visit: Payer: BC Managed Care – PPO | Admitting: Family

## 2022-10-14 ENCOUNTER — Telehealth: Payer: Self-pay

## 2022-10-14 ENCOUNTER — Telehealth: Payer: Self-pay | Admitting: Internal Medicine

## 2022-10-14 ENCOUNTER — Encounter (HOSPITAL_COMMUNITY): Payer: Self-pay | Admitting: Internal Medicine

## 2022-10-14 ENCOUNTER — Other Ambulatory Visit: Payer: Self-pay

## 2022-10-14 DIAGNOSIS — I4891 Unspecified atrial fibrillation: Secondary | ICD-10-CM | POA: Diagnosis present

## 2022-10-14 DIAGNOSIS — Z79899 Other long term (current) drug therapy: Secondary | ICD-10-CM | POA: Diagnosis not present

## 2022-10-14 DIAGNOSIS — R4587 Impulsiveness: Secondary | ICD-10-CM | POA: Diagnosis present

## 2022-10-14 DIAGNOSIS — K219 Gastro-esophageal reflux disease without esophagitis: Secondary | ICD-10-CM | POA: Diagnosis present

## 2022-10-14 DIAGNOSIS — D696 Thrombocytopenia, unspecified: Secondary | ICD-10-CM | POA: Diagnosis present

## 2022-10-14 DIAGNOSIS — Z794 Long term (current) use of insulin: Secondary | ICD-10-CM | POA: Diagnosis not present

## 2022-10-14 DIAGNOSIS — C3492 Malignant neoplasm of unspecified part of left bronchus or lung: Secondary | ICD-10-CM | POA: Diagnosis not present

## 2022-10-14 DIAGNOSIS — F329 Major depressive disorder, single episode, unspecified: Secondary | ICD-10-CM | POA: Diagnosis not present

## 2022-10-14 DIAGNOSIS — Z7189 Other specified counseling: Secondary | ICD-10-CM | POA: Diagnosis not present

## 2022-10-14 DIAGNOSIS — K863 Pseudocyst of pancreas: Secondary | ICD-10-CM | POA: Diagnosis present

## 2022-10-14 DIAGNOSIS — M86171 Other acute osteomyelitis, right ankle and foot: Secondary | ICD-10-CM | POA: Diagnosis not present

## 2022-10-14 DIAGNOSIS — R569 Unspecified convulsions: Secondary | ICD-10-CM | POA: Diagnosis present

## 2022-10-14 DIAGNOSIS — E876 Hypokalemia: Secondary | ICD-10-CM | POA: Diagnosis present

## 2022-10-14 DIAGNOSIS — C3490 Malignant neoplasm of unspecified part of unspecified bronchus or lung: Secondary | ICD-10-CM | POA: Diagnosis present

## 2022-10-14 DIAGNOSIS — B3789 Other sites of candidiasis: Secondary | ICD-10-CM | POA: Diagnosis present

## 2022-10-14 DIAGNOSIS — F4323 Adjustment disorder with mixed anxiety and depressed mood: Secondary | ICD-10-CM | POA: Diagnosis present

## 2022-10-14 DIAGNOSIS — G936 Cerebral edema: Secondary | ICD-10-CM | POA: Diagnosis present

## 2022-10-14 DIAGNOSIS — B954 Other streptococcus as the cause of diseases classified elsewhere: Secondary | ICD-10-CM | POA: Diagnosis present

## 2022-10-14 DIAGNOSIS — C7931 Secondary malignant neoplasm of brain: Secondary | ICD-10-CM | POA: Diagnosis present

## 2022-10-14 DIAGNOSIS — G9389 Other specified disorders of brain: Secondary | ICD-10-CM | POA: Diagnosis not present

## 2022-10-14 DIAGNOSIS — K851 Biliary acute pancreatitis without necrosis or infection: Secondary | ICD-10-CM | POA: Diagnosis present

## 2022-10-14 DIAGNOSIS — Z7984 Long term (current) use of oral hypoglycemic drugs: Secondary | ICD-10-CM | POA: Diagnosis not present

## 2022-10-14 DIAGNOSIS — K859 Acute pancreatitis without necrosis or infection, unspecified: Secondary | ICD-10-CM | POA: Diagnosis not present

## 2022-10-14 DIAGNOSIS — R45851 Suicidal ideations: Secondary | ICD-10-CM | POA: Diagnosis present

## 2022-10-14 DIAGNOSIS — R4589 Other symptoms and signs involving emotional state: Secondary | ICD-10-CM | POA: Diagnosis not present

## 2022-10-14 DIAGNOSIS — F411 Generalized anxiety disorder: Secondary | ICD-10-CM | POA: Diagnosis present

## 2022-10-14 DIAGNOSIS — Z515 Encounter for palliative care: Secondary | ICD-10-CM | POA: Diagnosis not present

## 2022-10-14 DIAGNOSIS — G4700 Insomnia, unspecified: Secondary | ICD-10-CM | POA: Diagnosis present

## 2022-10-14 DIAGNOSIS — I10 Essential (primary) hypertension: Secondary | ICD-10-CM | POA: Diagnosis present

## 2022-10-14 DIAGNOSIS — Z66 Do not resuscitate: Secondary | ICD-10-CM | POA: Diagnosis present

## 2022-10-14 DIAGNOSIS — E1165 Type 2 diabetes mellitus with hyperglycemia: Secondary | ICD-10-CM | POA: Diagnosis present

## 2022-10-14 LAB — GLUCOSE, CAPILLARY
Glucose-Capillary: 240 mg/dL — ABNORMAL HIGH (ref 70–99)
Glucose-Capillary: 226 mg/dL — ABNORMAL HIGH (ref 70–99)
Glucose-Capillary: 250 mg/dL — ABNORMAL HIGH (ref 70–99)
Glucose-Capillary: 220 mg/dL — ABNORMAL HIGH (ref 70–99)

## 2022-10-14 NOTE — Telephone Encounter (Signed)
This nurse received a call from this patient stating she is still in the hospital but she is confused about what is going on with her.  She wants to know more about a stent.  Patient is requesting to speak with this provider.  This nurse asked if she has requested to speak with the hospitalist.  Patient stated no she has not seen the hospitalist.  This nurse encouraged patient to ask for the hospitalist to get the most updated information on her plan of care.  However, her request will be made know to the provider.  Patient acknowledged understanding.  No further questions or concerns at this time.

## 2022-10-14 NOTE — Progress Notes (Signed)
       Overnight   NAME: Carolyn Hooper MRN: [id number] DOB : [birth year]    Date of Service   10/14/2022   HPI/Events of Note    Notification by RN Staff that Family POA has requested change of CODE status to DNR/DNI  I spoke with Mr Carolyn Hooper (son) who has verified POA as of today in presence of daytime RN staff and on the telephone. (Copy will be requested)    Mr Carolyn Hooper has verified:  NO - CPR NO - ETT/Ventilator NO - ACLS medications NO - Electrical shocks/defibrillation           YES - BiPAP           YES - Vasopressors as a temporary measure    Interventions/ Plan   DNR/DNI Vasoconstrictor medications ok  BiPAP ok Continue all previous orders      Carolyn Hooper BSN MSNA MSN ACNPC-AG Acute Care Nurse Practitioner [facility]

## 2022-10-14 NOTE — Telephone Encounter (Signed)
Called patient, no answer. She is currently hospitalized.   Jannifer Rodney, FNP

## 2022-10-14 NOTE — Progress Notes (Signed)
PROGRESS NOTE    Carolyn Hooper  CWC:[id number] DOB: [birth year] DOA: 10/13/2022 PCP: [name], FNP   Brief Narrative: This 62 yrs old female with medical history significant of hidradenitis suppurativa complicated by abscess, anxiety, type 2 diabetes, hyperinsulinemia, hypertension, gestational preeclampsia, auditory neuroma, history of pancreatitis, vertigo, seizures, history of lung cancer,  metastatic to brain who is brought to the emergency department by a relative after having seizures for the past 2 days. She describes her being unable to talk and fast blinking of her eyelids.  Patient also continued to complaining about abdominal pain from pancreatitis. Pertinent  labs include lipase 297, CT head showing progression of left frontal metastasis associated with vasogenic edema since 2 weeks ago.  Mild new mass effect on the left lateral ventricle but no midline shift or other complicating features.  Patient is admitted for further evaluation and started on Decadron as per Dr. Liana Gerold recommendation.  Assessment & Plan:   Principal Problem:   Seizures (HCC) Active Problems:   Acute pancreatitis   Abdominal pain   Type 2 diabetes mellitus (HCC)   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)   GAD (generalized anxiety disorder)   GERD (gastroesophageal reflux disease)   Pancreatic pseudocyst   Hypokalemia   Seizure: Likely secondary to metastatic brain lesion. 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 Case was discussed with neuro-oncology Dr. Barbaraann Cao. Continue dexamethasone 4 mg IVP every 6 hours. Continue Keppra 1000 mg p.o. twice daily. Seizure precautions/neurochecks. Neuro-oncology will evaluate in a.m.   Abdominal pain secondary to acute pancreatitis: Continue IV fluids. Analgesics as needed. Antiemetics as needed. Pantoprazole 40 mg p.o. daily. Pancreatic enzymes before meals/snacks   Pancreatic pseudocyst: Positive for Candida  and Streptococcus viridans Continue fluconazole 800 mg p.o. daily per ID. Resume Levaquin 500 mg p.o. daily.   Type 2 diabetes with hyperglycemia: Last HbA1c was 8.4% on 09/04/2022. Carbohydrate modified diet. CBG monitoring with RI SS.   GAD (generalized anxiety disorder) Continue Klonopin 0.5 mg every 12 hours as needed.   GERD (gastroesophageal reflux disease) Continue pantoprazole 40 mg daily Antiacid, H2 blocker as needed.   Hypokalemia Replaced.  Continue to monitor.  DVT prophylaxis:SCDs Code Status: Full code Family Communication: No family at bed side. Disposition Plan:   Status is: Inpatient Remains inpatient appropriate because: Admitted for seizures likely in the setting of brain metastasis.  Neuro-oncology was consulted recommended dexamethasone and Keppra    Consultants:  Neuro oncologist  Procedures:  Antimicrobials: Anti-infectives (From admission, onward)    Start     Dose/Rate Route Frequency Ordered Stop   10/13/22 1900  levofloxacin (LEVAQUIN) tablet 500 mg        500 mg Oral Daily 10/13/22 1812     10/13/22 1600  fluconazole (DIFLUCAN) tablet 800 mg        800 mg Oral Daily 10/13/22 1009     10/13/22 1005  valACYclovir (VALTREX) tablet 500 mg        500 mg Oral Daily PRN 10/13/22 1009        Subjective: Patient was seen and examined at bedside.  Overnight events noted. Patient reports doing better.  She has erythema on the face, states that is chronic.   Objective: Vitals:   10/13/22 1727 10/13/22 2126 10/14/22 0123 10/14/22 0533  BP: (!) 138/55 (!) 140/72 138/68 126/67  Pulse: 64 (!) 54 (!) 51 65  Resp: 18 20 20 20   Temp:  97.8 F (36.6 C) 98.4 F (  36.9 C) 97.7 F (36.5 C)  TempSrc:  Oral Oral Oral  SpO2: 97% 97% 99% 97%  Weight:      Height:        Intake/Output Summary (Last 24 hours) at 10/14/2022 1333 Last data filed at 10/14/2022 0800 Gross per 24 hour  Intake 3781.25 ml  Output 800 ml  Net 2981.25 ml   Filed Weights    10/13/22 0429  Weight: 54.4 kg    Examination:  General exam: Appears calm and comfortable, deconditioned, not in any acute distress. Respiratory system: Clear to auscultation. Respiratory effort normal.  RR 16 Cardiovascular system: S1 & S2 heard, RRR. No JVD, murmurs, rubs, gallops or clicks. Gastrointestinal system: Abdomen is nondistended, soft and nontender.Normal bowel sounds heard. Central nervous system: Alert and oriented x3. No focal neurological deficits. Extremities: No edema, no cyanosis, no clubbing Skin: No rashes, lesions or ulcers Psychiatry: Judgement and insight appear normal. Mood & affect appropriate.     Data Reviewed: I have personally reviewed following labs and imaging studies  CBC: Recent Labs  Lab 10/13/22 0425 10/14/22 0556  WBC 4.5 4.9  NEUTROABS 2.8  --   HGB 10.8* 10.1*  HCT 35.8* 32.9*  MCV 98.6 99.4  PLT 186 174   Basic Metabolic Panel: Recent Labs  Lab 10/13/22 0425 10/14/22 0556  NA 135 136  K 3.4* 4.6  CL 96* 100  CO2 22 24  GLUCOSE 111* 227*  BUN 10 9  CREATININE 1.00 0.74  CALCIUM 8.3* 8.4*   GFR: Estimated Creatinine Clearance: 62.6 mL/min (by C-G formula based on SCr of 0.74 mg/dL). Liver Function Tests: Recent Labs  Lab 10/13/22 0425 10/14/22 0556  AST 31 26  ALT 18 16  ALKPHOS 78 70  BILITOT 1.2 0.9  PROT 5.9* 5.6*  ALBUMIN 2.6* 2.4*   Recent Labs  Lab 10/13/22 0425  LIPASE 297*   No results for input(s): "AMMONIA" in the last 168 hours. Coagulation Profile: No results for input(s): "INR", "PROTIME" in the last 168 hours. Cardiac Enzymes: No results for input(s): "CKTOTAL", "CKMB", "CKMBINDEX", "TROPONINI" in the last 168 hours. BNP (last 3 results) No results for input(s): "PROBNP" in the last 8760 hours. HbA1C: No results for input(s): "HGBA1C" in the last 72 hours. CBG: Recent Labs  Lab 10/13/22 1242 10/13/22 1757 10/13/22 2123 10/14/22 0744 10/14/22 1239  GLUCAP 186* 242* 231* 240* 226*    Lipid Profile: No results for input(s): "CHOL", "HDL", "LDLCALC", "TRIG", "CHOLHDL", "LDLDIRECT" in the last 72 hours. Thyroid Function Tests: No results for input(s): "TSH", "T4TOTAL", "FREET4", "T3FREE", "THYROIDAB" in the last 72 hours. Anemia Panel: No results for input(s): "VITAMINB12", "FOLATE", "FERRITIN", "TIBC", "IRON", "RETICCTPCT" in the last 72 hours. Sepsis Labs: No results for input(s): "PROCALCITON", "LATICACIDVEN" in the last 168 hours.  No results found for this or any previous visit (from the past 240 hour(s)).   Radiology Studies: CT Head Wo Contrast  Result Date: 10/13/2022 CLINICAL DATA:  62 year old female with increasing seizures. Altered mental status. Lung cancer with brain metastases. EXAM: CT HEAD WITHOUT CONTRAST TECHNIQUE: Contiguous axial images were obtained from the base of the skull through the vertex without intravenous contrast. RADIATION DOSE REDUCTION: This exam was performed according to the departmental dose-optimization program which includes automated exposure control, adjustment of the mA and/or kV according to patient size and/or use of iterative reconstruction technique. COMPARISON:  Brain MRI 09/29/2022 and earlier. FINDINGS: Brain: Posterior left frontal lobe middle and superior frontal gyrus vasogenic edema related to known cortically  based metastasis (series 2, image 27) has progressed since 09/29/2022 (compare coronal image 44 today to series 15, image 17 at that time) and there is new mild mass effect on the left lateral ventricle. No acute intracranial hemorrhage identified. No ventriculomegaly. No midline shift. Normal basilar cisterns. No other areas of vasogenic edema by CT. No evidence of superimposed acute cortically based infarct. Vascular: No suspicious intracranial vascular hyperdensity. Skull: No acute or suspicious osseous lesion identified. Sinuses/Orbits: Visualized paranasal sinuses and mastoids are stable and well aerated. Other: No  acute orbit or scalp soft tissue finding. IMPRESSION: Progression of left frontal metastasis associated vasogenic edema since 09/29/2022. Mild new mass effect on the left lateral ventricle. But no midline shift or other complicating features. Electronically Signed   By: Odessa Fleming M.D.   On: 10/13/2022 06:34    Scheduled Meds:  Chlorhexidine Gluconate Cloth  6 each Topical Daily   dexamethasone (DECADRON) injection  4 mg Intravenous Q6H   fluconazole  800 mg Oral Daily   folic acid  1 mg Oral Daily   insulin aspart  0-15 Units Subcutaneous TID WC   levETIRAcetam  1,000 mg Oral BID   levofloxacin  500 mg Oral Daily   lipase/protease/amylase  72,000 Units Oral TID WC   pantoprazole  40 mg Oral Daily   sodium chloride flush  10-40 mL Intracatheter Q12H   Continuous Infusions:  lactated ringers 100 mL/hr at 10/14/22 1102     LOS: 0 days    Time spent: 50 mins.    Willeen Niece, MD [facility]   If 7PM-7AM, please contact night-coverage

## 2022-10-14 NOTE — Progress Notes (Signed)
Patient had pure wick and stated he have not been up for days but normally walks with a walker at home, assisted patient to the chair and patient did well getting up, encouraged patient to use the [facility], patient stated she rather keep the pure wick on, reminded patient that getting up to the [facility] will help improve her mobility and also help decrease her peri area redness and irritation if she is not using the pure wick. Encouraged patient to sit-up little longer in the chair as well.

## 2022-10-14 NOTE — Inpatient Diabetes Management (Addendum)
Inpatient Diabetes Program Recommendations  AACE/ADA: New Consensus Statement on Inpatient Glycemic Control (2015)  Target Ranges:  Prepandial:   less than 140 mg/dL      Peak postprandial:   less than 180 mg/dL (1-2 hours)      Critically ill patients:  140 - 180 mg/dL    Latest Reference Range & Units 10/13/22 06:52 10/13/22 12:42 10/13/22 17:57 10/13/22 21:23  Glucose-Capillary 70 - 99 mg/dL 97 161 (H)  3 units Novolog  242 (H)  5 units Novolog @1925  231 (H)  (H): Data is abnormally high  Latest Reference Range & Units 10/14/22 07:44  Glucose-Capillary 70 - 99 mg/dL 096 (H)  5 units Novolog   (H): Data is abnormally high    Admit with: Seizures--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--Acute Pancreatitis  History: DM  Home DM Meds: Lantus 15 units Daily (NOT taking)       Metformin 1000 mg BID (NOT taking)  Current Orders: Novolog Moderate Correction Scale/ SSI (0-15 units) TID AC    MD- Note Decadron 4 mg Q6 hours started yest AM.  CBGs now >200.  May consider starting low dose Semglee  Semglee 5 units Daily (0.1 units/kg)    --Will follow patient during hospitalization--  Ambrose Finland RN, MSN, CDCES Diabetes Coordinator Inpatient Glycemic Control Team Team Pager: [phone] (8a-5p)

## 2022-10-14 NOTE — Telephone Encounter (Signed)
Patient's son has stated that she is still currently hospitalized; we were able to reschedule appointment for earlier date if she is discharged before appointment dates if not patient son would contact to get appointment rescheduled; patient son is aware of this rescheduled appointment times/dates

## 2022-10-14 NOTE — TOC Progression Note (Signed)
Transition of Care [facility]) - Progression Note    Patient Details  Name: Carolyn Hooper MRN: [id number] Date of Birth: [birth year]  [facility]) CM/SW Contact  Geni Bers, RN Phone Number: 10/14/2022, 3:13 PM  Clinical Narrative:      Transition of Care (TOC) Screening Note   Patient Details  Name: Carolyn Hooper Date of Birth: Aug 10, 1960   Transition of Care [facility]) CM/SW Contact:    Geni Bers, RN Phone Number: 10/14/2022, 3:14 PM    Transition of Care Department [facility]) has reviewed patient and no TOC needs have been identified at this time. We will continue to monitor patient advancement through interdisciplinary progression rounds. If new patient transition needs arise, please place a TOC consult.         Expected Discharge Plan and Services                                               Social Determinants of Health (SDOH) Interventions SDOH Screenings   Food Insecurity: No Food Insecurity (10/14/2022)  Housing: Low Risk  (10/14/2022)  Transportation Needs: No Transportation Needs (10/14/2022)  Utilities: Not At Risk (10/14/2022)  Depression (PHQ2-9): Medium Risk (07/12/2022)  Tobacco Use: Medium Risk (10/14/2022)    Readmission Risk Interventions    10/08/2022    4:55 PM 09/24/2022    3:55 PM 09/07/2022    2:44 PM  Readmission Risk Prevention Plan  Transportation Screening Complete Complete Complete  Medication Review Oceanographer) Complete Complete Complete  PCP or Specialist appointment within 3-5 days of discharge Complete Complete Complete  HRI or Home Care Consult Complete Complete Complete  SW Recovery Care/Counseling Consult Complete Complete Complete  Palliative Care Screening Not Applicable Not Applicable Not Applicable  Skilled Nursing Facility Not Applicable Not Applicable Not Applicable

## 2022-10-14 NOTE — Telephone Encounter (Signed)
Called patient, but she is currently in hospital.   Jannifer Rodney, FNP

## 2022-10-14 NOTE — Plan of Care (Signed)

## 2022-10-15 ENCOUNTER — Ambulatory Visit: Payer: BC Managed Care – PPO | Admitting: Internal Medicine

## 2022-10-15 ENCOUNTER — Other Ambulatory Visit: Payer: BC Managed Care – PPO

## 2022-10-15 ENCOUNTER — Inpatient Hospital Stay: Payer: BC Managed Care – PPO | Admitting: Internal Medicine

## 2022-10-15 ENCOUNTER — Ambulatory Visit: Payer: BC Managed Care – PPO

## 2022-10-15 ENCOUNTER — Ambulatory Visit: Payer: BC Managed Care – PPO | Admitting: Physician Assistant

## 2022-10-15 ENCOUNTER — Telehealth: Payer: Self-pay

## 2022-10-15 DIAGNOSIS — R569 Unspecified convulsions: Secondary | ICD-10-CM | POA: Diagnosis not present

## 2022-10-15 LAB — GLUCOSE, CAPILLARY
Glucose-Capillary: 256 mg/dL — ABNORMAL HIGH (ref 70–99)
Glucose-Capillary: 247 mg/dL — ABNORMAL HIGH (ref 70–99)
Glucose-Capillary: 208 mg/dL — ABNORMAL HIGH (ref 70–99)
Glucose-Capillary: 172 mg/dL — ABNORMAL HIGH (ref 70–99)

## 2022-10-15 MED ORDER — INSULIN ASPART 100 UNIT/ML IJ SOLN
3.0000 [IU] | Freq: Three times a day (TID) | INTRAMUSCULAR | Status: DC
  Administered 2022-10-15 – 2022-10-27 (×30): 3 [IU] via SUBCUTANEOUS

## 2022-10-15 MED ORDER — INSULIN GLARGINE-YFGN 100 UNIT/ML ~~LOC~~ SOLN
5.0000 [IU] | Freq: Every day | SUBCUTANEOUS | Status: DC
  Administered 2022-10-15 – 2022-10-18 (×4): 5 [IU] via SUBCUTANEOUS
  Filled 2022-10-15 (×4): qty 0.05

## 2022-10-15 NOTE — Progress Notes (Signed)
Patient asking for the when Dr. Barbaraann Cao is coming to she her. Reinforced to patient that the hospitalist  is currently managing her care and is in contact to there oncologist and that she will follow-up with her oncologist, she wants to know what her radiation treatment plan is and that her oncologist is aware of her symptoms of hallucination and intermittent impaired vision on the right side. Reminded patient that the hospitalist is in contact with her oncologist. Dr. Idelle Leech made aware of patient's concerns.

## 2022-10-15 NOTE — Inpatient Diabetes Management (Signed)
Inpatient Diabetes Program Recommendations  AACE/ADA: New Consensus Statement on Inpatient Glycemic Control (2015)  Target Ranges:  Prepandial:   less than 140 mg/dL      Peak postprandial:   less than 180 mg/dL (1-2 hours)      Critically ill patients:  140 - 180 mg/dL    Latest Reference Range & Units 10/14/22 07:44 10/14/22 12:39 10/14/22 16:51 10/14/22 20:54  Glucose-Capillary 70 - 99 mg/dL 696 (H)  5 units Novolog  226 (H)  5 units Novolog  250 (H)  5 units Novolog  220 (H)  (H): Data is abnormally high  Latest Reference Range & Units 10/15/22 07:32  Glucose-Capillary 70 - 99 mg/dL 295 (H)  (H): Data is abnormally high   Home DM Meds: Lantus 15 units Daily (NOT taking)                             Metformin 1000 mg BID (NOT taking)   Current Orders: Novolog Moderate Correction Scale/ SSI (0-15 units) TID AC       MD- Note pt getting Decadron 4 mg Q6 hours CBGs now >200.   1. May consider starting low dose Semglee:  Semglee 5 units Daily (0.1 units/kg)  2. Please also consider starting Novolog Meal Coverage Novolog 3 units TID with meals HOLD if pt NPO HOLD if pt eats <50% meals    --Will follow patient during hospitalization--  Ambrose Finland RN, MSN, CDCES Diabetes Coordinator Inpatient Glycemic Control Team Team Pager: [phone] (8a-5p)

## 2022-10-15 NOTE — Progress Notes (Addendum)
PROGRESS NOTE    Carolyn Hooper  ION:[id number] DOB: Oct 12, 1960 DOA: 10/13/2022 PCP: [name], FNP   Brief Narrative: This 62 yrs old female with medical history significant of hidradenitis suppurativa complicated by abscess, anxiety, type 2 diabetes, hyperinsulinemia, hypertension, gestational preeclampsia, auditory neuroma, history of pancreatitis, vertigo, seizures, history of lung cancer,  metastatic to brain who is brought to the emergency department by a relative after having seizures for the past 2 days. She describes her being unable to talk and fast blinking of her eyelids.  Patient also continued to complaining about abdominal pain from pancreatitis. Pertinent  labs include lipase 297, CT head showing progression of left frontal metastasis associated with vasogenic edema since 2 weeks ago.  Mild new mass effect on the left lateral ventricle but no midline shift or other complicating features.  Patient is admitted for further evaluation and started on Decadron as per Dr. Liana Gerold recommendation.  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 Case was discussed with neuro-oncology Dr. Barbaraann Cao. Continue dexamethasone 4 mg IVP every 6 hours. Continue Keppra 1000 mg p.o. twice daily. Seizure precautions/neurochecks. Neuro-oncology will evaluate in a.m.  secure text sent to Dr. Barbaraann Cao.   Abdominal pain secondary to acute pancreatitis: Continue IV fluids. Analgesics as needed. Antiemetics as needed. Continue Pantoprazole 40 mg p.o. daily. Pancreatic enzymes before meals/snacks    Pancreatic pseudocyst: Positive for Candida and Streptococcus viridans. Continue fluconazole 800 mg p.o. daily per ID. Continue Levaquin 500 mg p.o. daily.   Type 2 diabetes with hyperglycemia: Last HbA1c was 8.4% on 09/04/2022. Carbohydrate modified diet. CBG monitoring with RI SS.   GAD (generalized anxiety disorder) Continue Klonopin 0.5 mg every 12 hours as needed.   GERD (gastroesophageal reflux disease) Continue pantoprazole 40 mg daily Antiacid, H2 blocker as needed.   Hypokalemia Replaced.  Continue to monitor.  DVT prophylaxis:SCDs Code Status: DNR Family Communication: No family at bed side. Disposition Plan:   Status is: Inpatient Remains inpatient appropriate because: Admitted for seizures likely in the setting of brain metastasis.  Neuro-oncology was consulted recommended dexamethasone and Keppra.    Consultants:  Neuro oncologist  Procedures:  Antimicrobials: Anti-infectives (From admission, onward)    Start     Dose/Rate Route Frequency Ordered Stop   10/13/22 1900  levofloxacin (LEVAQUIN) tablet 500 mg        500 mg Oral Daily 10/13/22 1812     10/13/22 1600  fluconazole (DIFLUCAN) tablet 800 mg        800 mg Oral Daily 10/13/22 1009 10/23/22 2359   10/13/22 1005  valACYclovir (VALTREX) tablet 500 mg        500 mg Oral Daily PRN 10/13/22 1009        Subjective: Patient was seen and examined at bedside.  Overnight events noted. Patient reports doing better.  She has erythema on the face which is improving. Patient is taking dexamethasone and blood sugar has been running high.   Objective: Vitals:   10/14/22 1700 10/14/22 2057 10/15/22 0645 10/15/22 1219  BP: 123/65 135/70 129/73 117/70  Pulse: 65 63 63 65  Resp:  20 16 15 16   Temp: 97.6 F (36.4 C) 98 F (36.7 C) 98.1 F (36.7 C) 97.6 F (36.4 C)  TempSrc: Oral Oral Oral Oral  SpO2: 99% 99% 97% 95%  Weight:      Height:        Intake/Output Summary (Last 24 hours) at 10/15/2022  1225 Last data filed at 10/15/2022 0825 Gross per 24 hour  Intake 3591.6 ml  Output 551 ml  Net 3040.6 ml   Filed Weights   10/13/22 0429  Weight: 54.4 kg    Examination:  General exam: Appears comfortable, deconditioned, not in any acute distress. Respiratory system: CTA bilaterally. Respiratory effort normal.  RR 14 Cardiovascular system: S1 & S2 heard, RRR. No JVD, murmurs, rubs, gallops or clicks. Gastrointestinal system: Abdomen is nondistended, soft and nontender. Normal bowel sounds heard. Central nervous system: Alert and oriented x 2. No focal neurological deficits. Extremities: No edema, no cyanosis, no clubbing Skin: No rashes, lesions or ulcers Psychiatry: Judgement and insight appear normal. Mood & affect appropriate.     Data Reviewed: I have personally reviewed following labs and imaging studies  CBC: Recent Labs  Lab 10/13/22 0425 10/14/22 0556 10/15/22 0443  WBC 4.5 4.9 7.2  NEUTROABS 2.8  --   --   HGB 10.8* 10.1* 9.8*  HCT 35.8* 32.9* 32.6*  MCV 98.6 99.4 100.9*  PLT 186 174 152   Basic Metabolic Panel: Recent Labs  Lab 10/13/22 0425 10/14/22 0556 10/15/22 0443  NA 135 136 139  K 3.4* 4.6 4.4  CL 96* 100 102  CO2 22 24 24   GLUCOSE 111* 227* 275*  BUN 10 9 13   CREATININE 1.00 0.74 0.86  CALCIUM 8.3* 8.4* 8.6*  MG  --   --  1.7  PHOS  --   --  3.7   GFR: Estimated Creatinine Clearance: 58.2 mL/min (by C-G formula based on SCr of 0.86 mg/dL). Liver Function Tests: Recent Labs  Lab 10/13/22 0425 10/14/22 0556 10/15/22 0443  AST 31 26 23   ALT 18 16 17   ALKPHOS 78 70 66  BILITOT 1.2 0.9 0.8  PROT 5.9* 5.6* 5.1*  ALBUMIN 2.6* 2.4* 2.2*   Recent Labs  Lab 10/13/22 0425  LIPASE 297*   No results for input(s): "AMMONIA" in the last 168 hours. Coagulation Profile: No results for input(s): "INR", "PROTIME" in the last 168 hours. Cardiac Enzymes: No results for input(s): "CKTOTAL", "CKMB", "CKMBINDEX", "TROPONINI" in the last 168  hours. BNP (last 3 results) No results for input(s): "PROBNP" in the last 8760 hours. HbA1C: No results for input(s): "HGBA1C" in the last 72 hours. CBG: Recent Labs  Lab 10/14/22 1239 10/14/22 1651 10/14/22 2054 10/15/22 0732 10/15/22 1139  GLUCAP 226* 250* 220* 256* 247*   Lipid Profile: No results for input(s): "CHOL", "HDL", "LDLCALC", "TRIG", "CHOLHDL", "LDLDIRECT" in the last 72 hours. Thyroid Function Tests: No results for input(s): "TSH", "T4TOTAL", "FREET4", "T3FREE", "THYROIDAB" in the last 72 hours. Anemia Panel: No results for input(s): "VITAMINB12", "FOLATE", "FERRITIN", "TIBC", "IRON", "RETICCTPCT" in the last 72 hours. Sepsis Labs: No results for input(s): "PROCALCITON", "LATICACIDVEN" in the last 168 hours.  No results found for this or any previous visit (from the past 240 hour(s)).   Radiology Studies: No results found.  Scheduled Meds:  Chlorhexidine Gluconate Cloth  6 each Topical Daily   dexamethasone (DECADRON) injection  4 mg Intravenous Q6H   fluconazole  800 mg Oral Daily   folic acid  1 mg Oral Daily  insulin aspart  0-15 Units Subcutaneous TID WC   insulin aspart  3 Units Subcutaneous TID WC   insulin glargine-yfgn  5 Units Subcutaneous Daily   levETIRAcetam  1,000 mg Oral BID   levofloxacin  500 mg Oral Daily   lipase/protease/amylase  72,000 Units Oral TID WC   pantoprazole  40 mg Oral Daily   sodium chloride flush  10-40 mL Intracatheter Q12H   Continuous Infusions:  lactated ringers 100 mL/hr at 10/15/22 0730     LOS: 1 day    Time spent: 35 mins.    Willeen Niece, MD [facility]   If 7PM-7AM, please contact night-coverage

## 2022-10-15 NOTE — Plan of Care (Signed)

## 2022-10-15 NOTE — Progress Notes (Signed)
Mobility Specialist - Progress Note   10/15/22 1028  Mobility  Activity Transferred to/from [facility];Ambulated with assistance in hallway  Level of Assistance Contact guard assist, steadying assist  Assistive Device Front wheel walker  Distance Ambulated (ft) 20 ft  Range of Motion/Exercises Active Assistive  Activity Response Tolerated fair  Mobility Referral Yes  $Mobility charge 1 Mobility  Mobility Specialist Start Time (ACUTE ONLY) 1000  Mobility Specialist Stop Time (ACUTE ONLY) 1028  Mobility Specialist Time Calculation (min) (ACUTE ONLY) 28 min   Pt was found on recliner chair and agreeable to ambulate. Pt was transferred to [facility] prior to session and then back to recliner chair due to being fatigued. After about 2 min stated being ready to try ambulation. Stated "okay I feel the floor moving" with session. At EOS returned to recliner chair with all needs met. Call bell in reach and chair alarm on.  Billey Chang Mobility Specialist

## 2022-10-15 NOTE — Telephone Encounter (Signed)
Patient is currently inpatient and reached out to this nurse, she appears very confused and is tearful.  Patient states that she does not know who her doctor is and which doctor is in charge.  This nurse advised that there is not just one assigned doctor.  If she has questions she can ask the nurse to get a questions to the doctor for her.  Patient states "I am just confused about what is going on and the brain cancer is not helping".  This nurse assured patient that the hospital staff will be her first contact while she is in the hospital.

## 2022-10-15 NOTE — Plan of Care (Signed)
  Problem: Fluid Volume: Goal: Ability to maintain a balanced intake and output will improve Outcome: Progressing   Problem: Health Behavior/Discharge Planning: Goal: Ability to identify and utilize available resources and services will improve Outcome: Progressing Goal: Ability to manage health-related needs will improve Outcome: Progressing   Problem: Metabolic: Goal: Ability to maintain appropriate glucose levels will improve Outcome: Progressing   Problem: Nutritional: Goal: Maintenance of adequate nutrition will improve Outcome: Progressing Goal: Progress toward achieving an optimal weight will improve Outcome: Progressing   Problem: Skin Integrity: Goal: Risk for impaired skin integrity will decrease Outcome: Progressing

## 2022-10-16 DIAGNOSIS — Z66 Do not resuscitate: Secondary | ICD-10-CM

## 2022-10-16 DIAGNOSIS — C7931 Secondary malignant neoplasm of brain: Secondary | ICD-10-CM

## 2022-10-16 DIAGNOSIS — R569 Unspecified convulsions: Secondary | ICD-10-CM | POA: Diagnosis not present

## 2022-10-16 DIAGNOSIS — C3490 Malignant neoplasm of unspecified part of unspecified bronchus or lung: Secondary | ICD-10-CM | POA: Diagnosis not present

## 2022-10-16 DIAGNOSIS — Z7189 Other specified counseling: Secondary | ICD-10-CM | POA: Diagnosis not present

## 2022-10-16 DIAGNOSIS — Z515 Encounter for palliative care: Secondary | ICD-10-CM | POA: Diagnosis not present

## 2022-10-16 LAB — GLUCOSE, CAPILLARY
Glucose-Capillary: 237 mg/dL — ABNORMAL HIGH (ref 70–99)
Glucose-Capillary: 249 mg/dL — ABNORMAL HIGH (ref 70–99)
Glucose-Capillary: 149 mg/dL — ABNORMAL HIGH (ref 70–99)
Glucose-Capillary: 208 mg/dL — ABNORMAL HIGH (ref 70–99)

## 2022-10-16 MED ORDER — BIOTENE DRY MOUTH MT LIQD
15.0000 mL | Freq: Two times a day (BID) | OROMUCOSAL | Status: DC
  Administered 2022-10-16 – 2022-11-04 (×30): 15 mL via OROMUCOSAL

## 2022-10-16 MED ORDER — SENNOSIDES-DOCUSATE SODIUM 8.6-50 MG PO TABS
1.0000 | ORAL_TABLET | Freq: Two times a day (BID) | ORAL | Status: DC
  Administered 2022-10-16 – 2022-11-04 (×30): 1 via ORAL
  Filled 2022-10-16 (×36): qty 1

## 2022-10-16 MED ORDER — DOCUSATE SODIUM 100 MG PO CAPS
100.0000 mg | ORAL_CAPSULE | Freq: Two times a day (BID) | ORAL | Status: DC
  Administered 2022-10-16 – 2022-11-04 (×30): 100 mg via ORAL
  Filled 2022-10-16 (×36): qty 1

## 2022-10-16 MED ORDER — LIDOCAINE 5 % EX PTCH
1.0000 | MEDICATED_PATCH | Freq: Once | CUTANEOUS | Status: DC
  Filled 2022-10-16: qty 1

## 2022-10-16 MED ORDER — LIDOCAINE 5 % EX PTCH
1.0000 | MEDICATED_PATCH | Freq: Once | CUTANEOUS | Status: AC | PRN
  Administered 2022-10-16: 1 via TRANSDERMAL

## 2022-10-16 NOTE — Progress Notes (Signed)
Chaplain engaged in an initial visit with Carolyn Hooper. Carolyn Hooper spent some time talking about her family support which includes her siblings, her mom, and her son. Carolyn Hooper shared a desire to have a family meeting/conversation in which she and her family can be honest and transparent about her health and how it has not only impacted her, but them as well. She voiced that the communication that she and her family once shared has changed and she attaches that to her health. She recognizes that she and her needs have changed within the last two years, and that her family has also changed. Carolyn Hooper was clear to say that she doesn't want to give up fighting her cancer but that she acknowledges that she is less independent and in need of more help and support. Chaplain assesses that Palliative Care would be a great resource for Carolyn Hooper to have a pertinent family discussion and to possibly have more clarity around her health.   Carolyn Hooper was also able to share that she really values when people around her give her the space to finish her sentences completely. She often feels like people are rushing her and finish her thoughts for her. This leaves her with the inability to remember her own thoughts. Carolyn Hooper feels her humanity when people have patience with her new way of conveying and dispensing information due to cancer. She has been utilizing a note pad to remember things and people around her.   Carolyn Hooper also recognizes that her oxygen levels are appropriate and well, but she still feels like she cannot breathe. She does find it helpful to have the nasal cannula on even though her levels may read as doing well. It helps calm her anxiety and fear. She voiced that medically her "numbers" have appeared to be fine for a long time, yet she has tumors and cysts. The nasal cannula is a measure of comfort for her.   Chaplain provided prayer over Carolyn Hooper at her request. Chaplain offered supportive and reflective listening, as well as support.      10/16/22 1300  Spiritual Encounters  Type of Visit Initial  Care provided to: Patient  Referral source Nurse (RN/NT/LPN)  Reason for visit Routine spiritual support  Spiritual Framework  Presenting Themes Community and relationships;Impactful experiences and emotions;Significant life change;Values and beliefs;Goals in life/care  Community/Connection Family  Strengths Clarity, Honesty, Communication  Interventions  Spiritual Care Interventions Made Established relationship of care and support;Compassionate presence;Reflective listening;Normalization of emotions;Reconciliation with self/others;Decision-making support/facilitation;Explored values/beliefs/practices/strengths;Prayer  Intervention Outcomes  Outcomes Awareness of support;Reduced anxiety;Reduced fear;Connection to spiritual care;Awareness around self/spiritual resourses;Connection to values and goals of care

## 2022-10-16 NOTE — Progress Notes (Signed)
PROGRESS NOTE    Carolyn Hooper  FAO:[id number] DOB: 22-Apr-1960 DOA: 10/13/2022  PCP: [name], FNP   Brief Narrative: This 62 yrs old female with medical history significant of hidradenitis suppurativa complicated by abscess, anxiety, type 2 diabetes, hyperinsulinemia, hypertension, gestational preeclampsia, auditory neuroma, history of pancreatitis, vertigo, seizures, history of lung cancer,  metastatic to brain who is brought to the emergency department by a relative after having seizures for the past 2 days. She describes her being unable to talk and fast blinking of her eyelids.  Patient also continued to complaining about abdominal pain from pancreatitis. Pertinent  labs include lipase 297, CT head showing progression of left frontal metastasis associated with vasogenic edema since 2 weeks ago.  Mild new mass effect on the left lateral ventricle but no midline shift or other complicating features.  Patient is admitted for further evaluation and started on Decadron as per Dr. Liana Gerold recommendation.  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 Case was discussed with neuro-oncology Dr. Barbaraann Cao. Continue dexamethasone 4 mg IVP every 6 hours. Continue Keppra 1000 mg p.o. twice daily. Seizure precautions /neurochecks. Discussed the case with Dr. Barbaraann Cao he recommended the patient can be discharged if she is doing better on Decadron and he will follow-up outpatient.   Abdominal pain secondary to acute pancreatitis: Continue IV fluids. Analgesics as needed. Antiemetics as  needed. Continue Pantoprazole 40 mg p.o. daily. Pancreatic enzymes before meals/snacks.   Pancreatic pseudocyst: Positive for Candida and Streptococcus viridans. Continue fluconazole 800 mg p.o. daily per ID. Continue Levaquin 500 mg p.o. daily.   Type 2 diabetes with hyperglycemia: Last HbA1c was 8.4% on 09/04/2022. Carbohydrate modified diet. CBG monitoring with RI SS.   GAD (generalized anxiety disorder) Continue Klonopin 0.5 mg every 12 hours as needed.   GERD (gastroesophageal reflux disease) Continue pantoprazole 40 mg daily Antiacid, H2 blocker as needed.   Hypokalemia Replaced.  Continue to monitor.  DVT prophylaxis:SCDs Code Status: DNR Family Communication: No family at bed side. Disposition Plan:   Status is: Inpatient Remains inpatient appropriate because: Admitted for seizures likely in the setting of brain metastasis.  Neuro-oncology was consulted recommended dexamethasone and Keppra.    Consultants:  Neuro oncologist  Procedures:  Antimicrobials: Anti-infectives (From admission, onward)    Start     Dose/Rate Route Frequency Ordered Stop   10/13/22 1900  levofloxacin (LEVAQUIN) tablet 500 mg        500 mg Oral Daily 10/13/22 1812     10/13/22 1600  fluconazole (DIFLUCAN) tablet 800 mg        800 mg Oral Daily 10/13/22 1009 10/23/22 2359   10/13/22 1005  valACYclovir (VALTREX) tablet 500 mg        500 mg Oral Daily PRN 10/13/22 1009        Subjective: Patient was seen and examined at bedside.  Overnight events noted. Patient reports doing better, She wants to know more about her treatment plan. Patient is taking dexamethasone and blood sugar has been running high. Patient wants to discuss with palliative care about goals of care.   Objective:  Vitals:   10/15/22 1219 10/15/22 2059 10/16/22 0658 10/16/22 1202  BP: 117/70 117/88 (!) 132/53 111/74  Pulse: 65 71 (!) 58 72  Resp: 16 16 17    Temp: 97.6 F (36.4 C) (!) 97.5 F (36.4 C) 97.7 F  (36.5 C) (!) 97.5 F (36.4 C)  TempSrc: Oral Oral Oral Oral  SpO2: 95% 100% 95% 99%  Weight:      Height:        Intake/Output Summary (Last 24 hours) at 10/16/2022 1407 Last data filed at 10/16/2022 1001 Gross per 24 hour  Intake 2518.64 ml  Output 2520 ml  Net -1.36 ml   Filed Weights   10/13/22 0429  Weight: 54.4 kg    Examination:  General exam: Appears comfortable, deconditioned, not in any acute distress. Respiratory system: CTA bilaterally. Respiratory effort normal.  RR 14 Cardiovascular system: S1 & S2 heard, RRR. No JVD, murmurs, rubs, gallops or clicks. Gastrointestinal system: Abdomen is non distended, soft and non tender. Normal bowel sounds heard. Central nervous system: Alert and oriented x 2. No focal neurological deficits. Extremities: No edema, no cyanosis, no clubbing Skin: No rashes, lesions or ulcers Psychiatry: Judgement and insight appear normal. Mood & affect appropriate.     Data Reviewed: I have personally reviewed following labs and imaging studies  CBC: Recent Labs  Lab 10/13/22 0425 10/14/22 0556 10/15/22 0443  WBC 4.5 4.9 7.2  NEUTROABS 2.8  --   --   HGB 10.8* 10.1* 9.8*  HCT 35.8* 32.9* 32.6*  MCV 98.6 99.4 100.9*  PLT 186 174 152   Basic Metabolic Panel: Recent Labs  Lab 10/13/22 0425 10/14/22 0556 10/15/22 0443  NA 135 136 139  K 3.4* 4.6 4.4  CL 96* 100 102  CO2 22 24 24   GLUCOSE 111* 227* 275*  BUN 10 9 13   CREATININE 1.00 0.74 0.86  CALCIUM 8.3* 8.4* 8.6*  MG  --   --  1.7  PHOS  --   --  3.7   GFR: Estimated Creatinine Clearance: 58.2 mL/min (by C-G formula based on SCr of 0.86 mg/dL). Liver Function Tests: Recent Labs  Lab 10/13/22 0425 10/14/22 0556 10/15/22 0443  AST 31 26 23   ALT 18 16 17   ALKPHOS 78 70 66  BILITOT 1.2 0.9 0.8  PROT 5.9* 5.6* 5.1*  ALBUMIN 2.6* 2.4* 2.2*   Recent Labs  Lab 10/13/22 0425  LIPASE 297*   No results for input(s): "AMMONIA" in the last 168 hours. Coagulation  Profile: No results for input(s): "INR", "PROTIME" in the last 168 hours. Cardiac Enzymes: No results for input(s): "CKTOTAL", "CKMB", "CKMBINDEX", "TROPONINI" in the last 168 hours. BNP (last 3 results) No results for input(s): "PROBNP" in the last 8760 hours. HbA1C: No results for input(s): "HGBA1C" in the last 72 hours. CBG: Recent Labs  Lab 10/15/22 1139 10/15/22 1641 10/15/22 2108 10/16/22 0737 10/16/22 1137  GLUCAP 247* 208* 172* 237* 249*   Lipid Profile: No results for input(s): "CHOL", "HDL", "LDLCALC", "TRIG", "CHOLHDL", "LDLDIRECT" in the last 72 hours. Thyroid Function Tests: No results for input(s): "TSH", "T4TOTAL", "FREET4", "T3FREE", "THYROIDAB" in the last 72 hours. Anemia Panel: No results for input(s): "VITAMINB12", "FOLATE", "FERRITIN", "TIBC", "IRON", "RETICCTPCT" in the last 72 hours. Sepsis Labs: No results for input(s): "PROCALCITON", "LATICACIDVEN" in the last 168 hours.  No results found for this or any previous visit (from the past 240 hour(s)).   Radiology Studies: No results found.  Scheduled Meds:  Chlorhexidine Gluconate Cloth  6 each  Topical Daily   dexamethasone (DECADRON) injection  4 mg Intravenous Q6H   docusate sodium  100 mg Oral BID   fluconazole  800 mg Oral Daily   folic acid  1 mg Oral Daily   insulin aspart  0-15 Units Subcutaneous TID WC   insulin aspart  3 Units Subcutaneous TID WC   insulin glargine-yfgn  5 Units Subcutaneous Daily   levETIRAcetam  1,000 mg Oral BID   levofloxacin  500 mg Oral Daily   lipase/protease/amylase  72,000 Units Oral TID WC   pantoprazole  40 mg Oral Daily   senna-docusate  1 tablet Oral BID   sodium chloride flush  10-40 mL Intracatheter Q12H   Continuous Infusions:  lactated ringers 100 mL/hr at 10/16/22 1347     LOS: 2 days    Time spent: 35 mins.    Willeen Niece, MD [facility]   If 7PM-7AM, please contact night-coverage

## 2022-10-16 NOTE — Evaluation (Signed)
Occupational Therapy Evaluation Patient Details Name: Carolyn Hooper MRN: [id number] DOB: 07-Feb-1961 Today's Date: 10/16/2022   History of Present Illness Pt is a 62 y/o female presenting to ED on 10/13/2022 due to reports of seizure like activity with fluttering eye lids and difficulty in speech. Pt indicates 2 seizures in 2 days, fall and poor PO intake with N and V.  Pt d/c from hospital following admission 7/20-7/23/2024 in which CT revealing episode of acute pancreatitis with new small fluid collection and found to be hypokalemic. Pt seen by GI and recommendation for conservative measures. Pt provided with IV dilaudid and pain improved.  Pt hospitalized 7/6-7/15/2024 with abdominal pain and hospitalized 6/19-6/27 with epigastric pain. CT abdomen revealed enhancing fluid collection in stomach. S/p cystogastrostomy on 7/11. MRI of brain on 09/29/2022 reveals stable size of 15 x 13 x 14 mm peripherally enhancing metastasis in the posterior left frontal lobe, stable punctate enhancing metastasis in the medial right frontal lobe. Pt PMH includes but is not limited to:  non-small cell lung CA, brain metastasis, focal seizures, DM and pt currently on immunotherapy/chemotherapy.   Clinical Impression   Patient is a 62 year old female who was admitted for above. Patient has had multiple hospitalizations in recent months with brain met diagnosis. Patient was noted to require max A for hygiene tasks with min A for rest of Adl tasks. Patient was noted to have decreased functional activity tolerance, decreased endurance, decreased standing balance, decreased safety awareness, and decreased knowledge of AD/AE impacting participation in ADLs. Patient plans to transition home with [facility] services and family support at time of d/c. Patient would continue to benefit from skilled OT services at this time while admitted and after d/c to address noted deficits in order to improve overall safety and independence in ADLs.         Recommendations for follow up therapy are one component of a multi-disciplinary discharge planning process, led by the attending physician.  Recommendations may be updated based on patient status, additional functional criteria and insurance authorization.   Assistance Recommended at Discharge Intermittent Supervision/Assistance  Patient can return home with the following A little help with walking and/or transfers;A little help with bathing/dressing/bathroom;Assistance with cooking/housework;Help with stairs or ramp for entrance;Direct supervision/assist for medications management;Direct supervision/assist for financial management;Assist for transportation    Functional Status Assessment  Patient has had a recent decline in their functional status and demonstrates the ability to make significant improvements in function in a reasonable and predictable amount of time.  Equipment Recommendations  None recommended by OT       Precautions / Restrictions Precautions Precautions: Fall Restrictions Weight Bearing Restrictions: No      Mobility Bed Mobility               General bed mobility comments: pt seated in recliner at start of session and returned to the same at end of session.          Balance Overall balance assessment: Needs assistance, History of Falls Sitting-balance support: Feet supported Sitting balance-Leahy Scale: Fair     Standing balance support: During functional activity Standing balance-Leahy Scale: Poor         ADL either performed or assessed with clinical judgement   ADL Overall ADL's : Needs assistance/impaired Eating/Feeding: Set up;Sitting   Grooming: Min guard;Sitting;Standing   Upper Body Bathing: Minimal assistance;Sitting   Lower Body Bathing: Moderate assistance;Sitting/lateral leans   Upper Body Dressing : Minimal assistance;Sitting   Lower Body Dressing: Sitting/lateral  leans;Sit to/from stand;Moderate assistance   Toilet  Transfer: Minimal assistance;Ambulation Toilet Transfer Details (indicate cue type and reason): with RW Toileting- Clothing Manipulation and Hygiene: Maximal assistance;Sit to/from stand Toileting - Clothing Manipulation Details (indicate cue type and reason): patient was unable to reach bottom to complete hygiene tasks after voiding urine. patient needed increased A patietn also anxious about placement of sacral pad for pressure prevention. education was provided that she was still able to particiapte in hygiene as needed with this in place.             Vision   Additional Comments: patient was unable to participate in full vision testing or even minimal testing. patient reported less vision in R eye but was very quick to fatigue with partial tracking testing,. patient was inconsistent in short test with deficits.            Pertinent Vitals/Pain Pain Assessment Pain Assessment: No/denies pain     Hand Dominance Right   Extremity/Trunk Assessment Upper Extremity Assessment Upper Extremity Assessment: Overall WFL for tasks assessed   Lower Extremity Assessment Lower Extremity Assessment: Defer to PT evaluation   Cervical / Trunk Assessment Cervical / Trunk Assessment: Kyphotic   Communication Communication Communication: No difficulties (delayed reponses)   Cognition Arousal/Alertness: Awake/alert Behavior During Therapy: WFL for tasks assessed/performed Overall Cognitive Status: Impaired/Different from baseline Area of Impairment: Problem solving, Attention, Following commands                   Current Attention Level: Focused   Following Commands: Follows multi-step commands consistently     Problem Solving: Slow processing General Comments: some delayed responses,and mild wordfinding difficulties noted,                Home Living Family/patient expects to be discharged to:: Private residence Living Arrangements: Parent (and son) Available Help at  Discharge: Family;Available PRN/intermittently Type of Home: Mobile home Home Access: Ramped entrance     Home Layout: One level     Bathroom Shower/Tub: Producer, television/film/video: Handicapped height     Home Equipment: Cane - single point;Shower Counsellor (2 wheels)   Additional Comments: pt reports d/c plan to transition home with mother and son      Prior Functioning/Environment Prior Level of Function : Independent/Modified Independent;Working/employed;Driving             Mobility Comments: RW for home navigation ADLs Comments: pt reports family assist with IADLs        OT Problem List: Decreased strength;Decreased range of motion;Decreased activity tolerance;Impaired balance (sitting and/or standing);Decreased cognition;Decreased safety awareness      OT Treatment/Interventions: Self-care/ADL training;Therapeutic exercise;DME and/or AE instruction;Energy conservation;Therapeutic activities;Patient/family education;Balance training;Cognitive remediation/compensation;Visual/perceptual remediation/compensation    OT Goals(Current goals can be found in the care plan section) Acute Rehab OT Goals Patient Stated Goal: to get back home with mother OT Goal Formulation: With patient Time For Goal Achievement: 10/30/22 Potential to Achieve Goals: Fair  OT Frequency: Min 1X/week    Co-evaluation PT/OT/SLP Co-Evaluation/Treatment: Yes Reason for Co-Treatment: To address functional/ADL transfers PT goals addressed during session: Mobility/safety with mobility OT goals addressed during session: ADL's and self-care      AM-PAC OT "6 Clicks" Daily Activity     Outcome Measure Help from another person eating meals?: A Little Help from another person taking care of personal grooming?: A Little Help from another person toileting, which includes using toliet, bedpan, or urinal?: A Little Help from another person bathing (including  washing, rinsing, drying)?:  A Little Help from another person to put on and taking off regular upper body clothing?: A Little Help from another person to put on and taking off regular lower body clothing?: A Lot 6 Click Score: 17   End of Session Equipment Utilized During Treatment: Gait belt;Rolling walker (2 wheels) Nurse Communication: Mobility status  Activity Tolerance: Patient tolerated treatment well Patient left: with call bell/phone within reach;in chair;with chair alarm set  OT Visit Diagnosis: Unsteadiness on feet (R26.81);Other abnormalities of gait and mobility (R26.89);Muscle weakness (generalized) (M62.81)                Time: 4098-1191 OT Time Calculation (min): 29 min Charges:  OT General Charges $OT Visit: 1 Visit OT Evaluation $OT Eval Moderate Complexity: 1 Mod  Delois Tolbert OTR/L, MS Acute Rehabilitation Department Office# [phone]   Selinda Flavin 10/16/2022, 1:25 PM

## 2022-10-16 NOTE — Consult Note (Cosign Needed)
Consultation Note Date: 10/16/2022   Patient Name: Carolyn Hooper  DOB: December 03, 1960  MRN: [id number]  Age / Sex: 62 y.o., female  PCP: [name], FNP Referring Physician: Willeen Niece, MD  Reason for Consultation: Establishing goals of care  HPI/Patient Profile: 62 y.o. female  with past medical history of hidradenitis suppurativa complicated by abscess, anxiety, type 2 diabetes, hyperinsulinemia, hypertension, gestational preeclampsia, auditory neuroma, history of pancreatitis, vertigo, seizures, and history of lung cancer metastatic to brain admitted on 10/13/2022 with seizures. Also with ongoing abdominal pain d/t pancreatitis. CT head revealed progression of left frontal metastasis and edema. PMT consulted to discuss GOC.    Clinical Assessment and Goals of Care: I have reviewed medical records including EPIC notes, labs and imaging, received report from RN, assessed the patient and then met with patient  to discuss diagnosis prognosis, GOC, EOL wishes, disposition and options.  I introduced Palliative Medicine as specialized medical care for people living with serious illness. It focuses on providing relief from the symptoms and stress of a serious illness. The goal is to improve quality of life for both the patient and the family.  We discussed a brief life review of the patient. Patient tells me about her son [name]. She tells me about her mother that she lives with. She tells me she is the oldest of her siblings. She tells me about her father who passed away at a hospice facility.   As far as functional and nutritional status Ms Solecki reports a decline. She tells me of using a walker for ambulation but ambulation has become more difficult, she feels weaker. She tells me of essentially no appetite - feels she has to force herself to eat. She also tells me of changes in her cognition, worsening.    We discussed patient's current  illness and what it means in the larger context of patient's on-going co-morbidities.  Natural disease trajectory and expectations at EOL were discussed. We discuss concerning that we are seeing declines in function, nutrition, and cognition. Discussed concern of poor prognosis. She understands and expected this.   She tells me she feels her family understands how ill she is but she thinks a family meeting is warranted. She doesn't want to have this in the hospital as she wants it to be "low drama" and she understands she may be discharged tomorrow. We discuss that this can be arranged outpatient. She wants her family to be prepared about what to expect moving forward. We discuss that an outpatient palliative referral may be helpful - discuss outpatient palliative in the cancer center. She is agreeable to referral.   She tells me of changes in her vision as well as vertigo. She tells me of her prior discussions with Dr Barbaraann Cao about this.   She tells me of her current complaint of dry mouth - will add biotene.   Ms Reily is interested in support of physical therapy outpatient - hoping to regain strength she has lost since being admitted.   I attempted to elicit values and goals of care important to the patient.  She shares that she is open to continued cancer treatment at the moment. She plans to follow up with Dr. Arbutus Ped and Dr. Barbaraann Cao.   She shares that whenever it is determined she is nearing end of life, she does not want to suffer. In fact, she shares it is really important to her to not be at home when she is dying as she fears she will  not have enough support and may suffer as she nears end of life. She feels hospice facility may better support her goals to be as comfortable as possible as she approaches end of life.   Ms Olliff shares with me she has completed advance directives - they are notarized and she has made copies. She tells me her son [name] is named as her HCPOA.   Discussed with  patient the importance of continued conversation with family and the medical providers regarding overall plan of care and treatment options, ensuring decisions are within the context of the patient's values and GOCs.    Questions and concerns were addressed. The family was encouraged to call with questions or concerns.    Primary Decision Maker PATIENT  Son [name] is named as HCPOA  SUMMARY OF RECOMMENDATIONS   - referral to outpatient palliative at cancer center - Athena Cousar Lowella Bandy) - alerted [facility] to referral and discussed - patient would like outpatient family meeting to prepare her family for what to expect moving forward - discussed decline in function, nutrition, and cognition - [name] is named HCPOA, family has notarized copies of advanced directives - main complaint at this time is dry mouth, add biotene - patient reports when she is nearing end of life she would like hospice facility, does not want hospice support at home - at this time, patient remains open to treatment options offered by Dr. Arbutus Ped and Dr. 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 The following aspects are pertinent.  Past Medical History:  Diagnosis Date   Abscess    hydroadenitis superativa   Anxiety    DM (diabetes mellitus) (HCC)    Hyperinsulinemia    Hypertension 1983   gestational - pre-eclampsia   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[facility])    Neuroma    audiotry neuroma   Pancreatitis    Seizure (HCC)    Vertigo    Social History   Socioeconomic History   Marital status: Divorced     Spouse name: Not on file   Number of children: Not on file   Years of education: Not on file   Highest education level: Not on file  Occupational History   Not on file  Tobacco Use   Smoking status: Former    Current packs/day: 0.00    Types: Cigarettes    Quit date: 02/11/2022    Years since quitting: 0.6   Smokeless tobacco: Never  Vaping Use   Vaping status: Never Used  Substance and Sexual Activity   Alcohol use: Yes    Comment: very rare   Drug use: No   Sexual activity: Not on file  Other Topics Concern   Not on file  Social History Narrative   Not on file   Social Determinants of Health   Financial Resource Strain: Not on file  Food Insecurity: No Food Insecurity (10/14/2022)   Hunger Vital Sign    Worried About Running Out of Food in the Last Year: Never true    Ran Out of Food in the Last Year: Never true  Transportation Needs: No Transportation Needs (10/14/2022)  PRAPARE - Administrator, Civil Service (Medical): No    Lack of Transportation (Non-Medical): No  Physical Activity: Not on file  Stress: Not on file  Social Connections: Not on file   Family History  Problem Relation Age of Onset   COPD Mother    Heart disease Father    Diabetes Father    Kidney disease Father    Arthritis Father        RA   Liver disease Father    Scheduled Meds:  antiseptic oral rinse  15 mL Mouth Rinse BID   Chlorhexidine Gluconate Cloth  6 each Topical Daily   dexamethasone (DECADRON) injection  4 mg Intravenous Q6H   docusate sodium  100 mg Oral BID   fluconazole  800 mg Oral Daily   folic acid  1 mg Oral Daily   insulin aspart  0-15 Units Subcutaneous TID WC   insulin aspart  3 Units Subcutaneous TID WC   insulin glargine-yfgn  5 Units Subcutaneous Daily   levETIRAcetam  1,000 mg Oral BID   levofloxacin  500 mg Oral Daily   lipase/protease/amylase  72,000 Units Oral TID WC   pantoprazole  40 mg Oral Daily   senna-docusate  1 tablet Oral BID    sodium chloride flush  10-40 mL Intracatheter Q12H   Continuous Infusions:  lactated ringers 100 mL/hr at 10/16/22 1347   PRN Meds:.acetaminophen **OR** acetaminophen, clonazePAM, lipase/protease/amylase, LORazepam, ondansetron **OR** ondansetron (ZOFRAN) IV, oxyCODONE, prochlorperazine, sodium chloride flush, valACYclovir Allergies  Allergen Reactions   Adhesive [Tape] Dermatitis   Statins Itching and Other (See Comments)    Myalgias, also   Review of Systems  Constitutional:  Positive for activity change, appetite change and fatigue.  Neurological:  Positive for dizziness, speech difficulty and weakness.  Psychiatric/Behavioral:  Positive for confusion and decreased concentration.     Physical Exam Constitutional:      General: She is not in acute distress.    Appearance: She is ill-appearing.  Pulmonary:     Effort: Pulmonary effort is normal.  Skin:    General: Skin is warm and dry.  Neurological:     Mental Status: She is alert and oriented to person, place, and time.     Comments: Slow processing     Vital Signs: BP 111/74 (BP Location: Right Arm)   Pulse 72   Temp (!) 97.5 F (36.4 C) (Oral)   Resp 17   Ht 5\' 8"  (1.727 m)   Wt 54.4 kg   SpO2 99%   BMI 18.25 kg/m  Pain Scale: 0-10   Pain Score: 4    SpO2: SpO2: 99 % O2 Device:SpO2: 99 % O2 Flow Rate: .O2 Flow Rate (L/min): 2 L/min  IO: Intake/output summary:  Intake/Output Summary (Last 24 hours) at 10/16/2022 1710 Last data filed at 10/16/2022 1700 Gross per 24 hour  Intake 1541 ml  Output 3270 ml  Net -1729 ml    LBM: Last BM Date : 10/13/22 Baseline Weight: Weight: 54.4 kg Most recent weight: Weight: 54.4 kg     Palliative Assessment/Data: PPS 50%     *Please note that this is a verbal dictation therefore any spelling or grammatical errors are due to the "[employer] One" system interpretation.  Gerlean Ren, DNP, AGNP-C Palliative Medicine Team [phone] Pager:  [phone]

## 2022-10-16 NOTE — Plan of Care (Signed)

## 2022-10-16 NOTE — Evaluation (Signed)
Physical Therapy Evaluation Patient Details Name: Carolyn Hooper MRN: [id number] DOB: 31-Dec-1960 Today's Date: 10/16/2022  History of Present Illness  Pt is a 62 y/o female presenting to ED on 10/13/2022 due to reports of seizure like activity with fluttering eye lids and difficulty in speech. Pt indicates 2 seizures in 2 days, fall and poor PO intake with N and V.  Pt d/c from hospital following admission 7/20-7/23/2024 in which CT revealing episode of acute pancreatitis with new small fluid collection and found to be hypokalemic. Pt seen by GI and recommendation for conservative measures. Pt provided with IV dilaudid and pain improved.  Pt hospitalized 7/6-7/15/2024 with abdominal pain and hospitalized 6/19-6/27 with epigastric pain. CT abdomen revealed enhancing fluid collection in stomach. S/p cystogastrostomy on 7/11. MRI of brain on 09/29/2022 reveals stable size of 15 x 13 x 14 mm peripherally enhancing metastasis in the posterior left frontal lobe, stable punctate enhancing metastasis in the medial right frontal lobe. Pt PMH includes but is not limited to:  non-small cell lung CA, brain metastasis, focal seizures, DM and pt currently on immunotherapy/chemotherapy.  Clinical Impression      Pt admitted with above diagnosis.  Pt currently with functional limitations due to the deficits listed below (see PT Problem List). Pt seated in recliner when therapist arrived and nursing staff present.  Pt agreeable to therapy evaluation. PT is familiar with pt from previous admissions and pt appears to be demonstrating improved ability with word finding and speech patterns today, pt reports she is focusing on it and taking her time to communicate. Pt expressed concern per B UE tremors, however not as pronounced as last admission. Pt required min guard for sit to stand  from recliner with cues, mod A for sit to stand  from commode with increased time, static standing with 1 UE support, gait 15 feet x 2 with RW,  shuffling pattern with slow cadence and min guard. Pt reports fatigue. Pt seated in recliner all needs in place. Pt indicates she plans on returning home with her mother and son. Pt will benefit from acute skilled PT to increase their independence and safety with mobility to allow discharge.       If plan is discharge home, recommend the following: A little help with walking and/or transfers;A little help with bathing/dressing/bathroom;Assistance with cooking/housework;Help with stairs or ramp for entrance;Assist for transportation   Can travel by private vehicle        Equipment Recommendations None recommended by PT (reports RW in home setting)  Recommendations for Other Services       Functional Status Assessment Patient has had a recent decline in their functional status and demonstrates the ability to make significant improvements in function in a reasonable and predictable amount of time.     Precautions / Restrictions Precautions Precautions: Fall Restrictions Weight Bearing Restrictions: No      Mobility  Bed Mobility               General bed mobility comments: pt seated in recliner when PT arrived    Transfers Overall transfer level: Needs assistance Equipment used: Rolling walker (2 wheels) Transfers: Sit to/from Stand Sit to Stand: Min assist           General transfer comment: min cues for UE and AD  placement with min gaurd from  recliner and mod A for commode transfer    Ambulation/Gait Ambulation/Gait assistance: Min guard Gait Distance (Feet): 15 Feet Assistive device: Rolling walker (2 wheels)  Gait Pattern/deviations: Step-to pattern, Wide base of support, Trunk flexed, Shuffle Gait velocity: decreased     General Gait Details: gait limited due to fatigue today. pt is able to amb in personal room with RW with shuffling pattern and slow cadence  Stairs            Wheelchair Mobility     Tilt Bed    Modified Rankin (Stroke  Patients Only)       Balance Overall balance assessment: Needs assistance, History of Falls (pt reports fall 3 days prior to readmission to hospital) Sitting-balance support: Feet supported Sitting balance-Leahy Scale: Fair     Standing balance support: During functional activity Standing balance-Leahy Scale: Poor Standing balance comment: pt required UE support at sink for handwashing and maintained 1 UE support for balance with hygiene tasks                             Pertinent Vitals/Pain Pain Assessment Pain Assessment: No/denies pain    Home Living Family/patient expects to be discharged to:: Private residence Living Arrangements: Parent (and son) Available Help at Discharge: Family;Available PRN/intermittently Type of Home: Mobile home Home Access: Ramped entrance       Home Layout: One level Home Equipment: Cane - single point;Shower Counsellor (2 wheels) Additional Comments: pt reports d/c plan to transition home with mother and son    Prior Function Prior Level of Function : Independent/Modified Independent;Working/employed;Driving             Mobility Comments: RW for home navigation ADLs Comments: pt reports family assist with IADLs     Hand Dominance   Dominant Hand: Right    Extremity/Trunk Assessment        Lower Extremity Assessment Lower Extremity Assessment: Generalized weakness    Cervical / Trunk Assessment Cervical / Trunk Assessment:  (slight head forward)  Communication   Communication: No difficulties (delayed reponses)  Cognition     Overall Cognitive Status: Impaired/Different from baseline Area of Impairment: Problem solving, Attention, Following commands                   Current Attention Level: Focused   Following Commands: Follows multi-step commands consistently     Problem Solving: Slow processing General Comments: some delayed responses,and mild wordfinding difficulties noted         General Comments      Exercises     Assessment/Plan    PT Assessment Patient needs continued PT services  PT Problem List Decreased activity tolerance;Decreased balance;Decreased mobility;Decreased coordination       PT Treatment Interventions DME instruction;Gait training;Stair training;Functional mobility training;Therapeutic activities;Therapeutic exercise;Balance training;Neuromuscular re-education;Patient/family education    PT Goals (Current goals can be found in the Care Plan section)  Acute Rehab PT Goals Patient Stated Goal: to be able to go home/mothers house today and to speak better PT Goal Formulation: With patient Time For Goal Achievement: 10/30/22 Potential to Achieve Goals: Good    Frequency Min 1X/week     Co-evaluation   Reason for Co-Treatment: To address functional/ADL transfers PT goals addressed during session: Mobility/safety with mobility OT goals addressed during session: ADL's and self-care       AM-PAC PT "6 Clicks" Mobility  Outcome Measure Help needed turning from your back to your side while in a flat bed without using bedrails?: A Little Help needed moving from lying on your back to sitting on the side of a flat bed  without using bedrails?: A Little Help needed moving to and from a bed to a chair (including a wheelchair)?: A Little Help needed standing up from a chair using your arms (e.g., wheelchair or bedside chair)?: A Little Help needed to walk in hospital room?: A Little Help needed climbing 3-5 steps with a railing? : A Lot 6 Click Score: 17    End of Session Equipment Utilized During Treatment: Gait belt Activity Tolerance: Patient limited by fatigue Patient left: in chair;with call bell/phone within reach;with chair alarm set Nurse Communication: Mobility status PT Visit Diagnosis: Unsteadiness on feet (R26.81);Muscle weakness (generalized) (M62.81);Difficulty in walking, not elsewhere classified (R26.2)    Time:  3244-0102 PT Time Calculation (min) (ACUTE ONLY): 29 min   Charges:   PT Evaluation $PT Eval Low Complexity: 1 Low   PT General Charges $$ ACUTE PT VISIT: 1 Visit         Johnny Bridge, PT Acute Rehab   Jacqualyn Posey 10/16/2022, 11:06 AM

## 2022-10-16 NOTE — TOC Progression Note (Signed)
Transition of Care [facility]) - Progression Note    Patient Details  Name: Carolyn Hooper MRN: [id number] Date of Birth: 11-24-1960  [facility] CM/SW Contact  [name], RN Phone Number: 10/16/2022, 2:09 PM  Clinical Narrative:  Met with pt at  bedside to introduce role of TOC/NCM and review for dc planning- PT recommendation for [facility] PT/OT, pt reports she has a hx of brain cancer, request time to process information and to follow up with her tomorrow for her decision. TOC will continue to follow.          Expected Discharge Plan and Services                                               Social Determinants of Health (SDOH) Interventions SDOH Screenings   Food Insecurity: No Food Insecurity (10/14/2022)  Housing: Low Risk  (10/14/2022)  Transportation Needs: No Transportation Needs (10/14/2022)  Utilities: Not At Risk (10/14/2022)  Depression (PHQ2-9): Medium Risk (07/12/2022)  Tobacco Use: Medium Risk (10/14/2022)    Readmission Risk Interventions    10/08/2022    4:55 PM 09/24/2022    3:55 PM 09/07/2022    2:44 PM  Readmission Risk Prevention Plan  Transportation Screening Complete Complete Complete  Medication Review Oceanographer) Complete Complete Complete  PCP or Specialist appointment within 3-5 days of discharge Complete Complete Complete  HRI or Home Care Consult Complete Complete Complete  SW Recovery Care/Counseling Consult Complete Complete Complete  Palliative Care Screening Not Applicable Not Applicable Not Applicable  Skilled Nursing Facility Not Applicable Not Applicable Not Applicable

## 2022-10-17 ENCOUNTER — Inpatient Hospital Stay: Payer: BC Managed Care – PPO | Admitting: Internal Medicine

## 2022-10-17 ENCOUNTER — Inpatient Hospital Stay: Payer: BC Managed Care – PPO

## 2022-10-17 DIAGNOSIS — R569 Unspecified convulsions: Secondary | ICD-10-CM | POA: Diagnosis not present

## 2022-10-17 LAB — GLUCOSE, CAPILLARY
Glucose-Capillary: 253 mg/dL — ABNORMAL HIGH (ref 70–99)
Glucose-Capillary: 279 mg/dL — ABNORMAL HIGH (ref 70–99)
Glucose-Capillary: 229 mg/dL — ABNORMAL HIGH (ref 70–99)
Glucose-Capillary: 215 mg/dL — ABNORMAL HIGH (ref 70–99)

## 2022-10-17 NOTE — Progress Notes (Signed)
Patient was constipated for past 4 days. Today she finally had 2 BM's after giving her prune juice.

## 2022-10-17 NOTE — Progress Notes (Signed)
PROGRESS NOTE    Carolyn Hooper  NWG:[id number] DOB: 21-Jun-1960 DOA: 10/13/2022  PCP: [name], FNP   Brief Narrative: This 62 yrs old female with medical history significant of hidradenitis suppurativa complicated by abscess, anxiety, type 2 diabetes, hyperinsulinemia, hypertension, gestational preeclampsia, auditory neuroma, history of pancreatitis, vertigo, seizures, history of lung cancer,  metastatic to brain who is brought to the emergency department by a relative after having seizures for the past 2 days. She describes her being unable to talk and fast blinking of her eyelids.  Patient also continued to complaining about abdominal pain from pancreatitis. Pertinent  labs include lipase 297, CT head showing progression of left frontal metastasis associated with vasogenic edema since 2 weeks ago.  Mild new mass effect on the left lateral ventricle but no midline shift or other complicating features.  Patient is admitted for further evaluation and started on Decadron as per Dr. Liana Gerold recommendation.  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 Case was discussed with neuro-oncology Dr. Barbaraann Cao. Continue dexamethasone 4 mg IVP every 6 hours. Continue Keppra 1000 mg p.o. twice daily. Seizure precautions /neurochecks. Discussed the case with Dr. Barbaraann Cao he recommended the patient can be discharged if she is doing better on Decadron and he will follow-up outpatient.   Abdominal pain secondary to acute pancreatitis: Continue IV fluids. Analgesics as needed. Antiemetics as needed. Continue  Pantoprazole 40 mg p.o. daily. Pancreatic enzymes before meals/snacks.   Pancreatic pseudocyst: Positive for Candida and Streptococcus viridans. Continue fluconazole 800 mg p.o. daily per ID. Continue Levaquin 500 mg p.o. daily.   Type 2 diabetes with hyperglycemia: Last HbA1c was 8.4% on 09/04/2022. Carbohydrate modified diet. CBG monitoring with RI SS.   GAD (generalized anxiety disorder) Continue Klonopin 0.5 mg every 12 hours as needed.   GERD (gastroesophageal reflux disease) Continue pantoprazole 40 mg daily Antiacid, H2 blocker as needed.   Hypokalemia Replaced.  Continue to monitor.  DVT prophylaxis:SCDs Code Status: DNR Family Communication: No family at bed side. Disposition Plan:   Status is: Inpatient Remains inpatient appropriate because: Admitted for seizures likely in the setting of brain metastasis.  Neuro-oncology was consulted recommended dexamethasone and Keppra.  Anticipated DC home with [facility]HS.    Consultants:  Neuro oncologist  Procedures:  Antimicrobials: Anti-infectives (From admission, onward)    Start     Dose/Rate Route Frequency Ordered Stop   10/13/22 1900  levofloxacin (LEVAQUIN) tablet 500 mg        500 mg Oral Daily 10/13/22 1812     10/13/22 1600  fluconazole (DIFLUCAN) tablet 800 mg        800 mg Oral Daily 10/13/22 1009 10/23/22 2359   10/13/22 1005  valACYclovir (VALTREX) tablet 500 mg        500 mg Oral Daily PRN 10/13/22 1009        Subjective: Patient was seen and examined at bedside.  Overnight events noted. Patient reports doing better.  She wants to know more about her treatment plan. Patient is taking dexamethasone and blood sugar has been running high. Seems depressed and anxious.   Objective: Vitals:  10/16/22 0658 10/16/22 1202 10/16/22 2116 10/17/22 0452  BP: (!) 132/53 111/74 132/86 120/78  Pulse: (!) 58 72 89 88  Resp: 17  16 14   Temp: 97.7 F (36.5 C) (!) 97.5 F (36.4 C) 97.6 F (36.4 C) (!) 97.5 F  (36.4 C)  TempSrc: Oral Oral Oral Oral  SpO2: 95% 99%  98%  Weight:      Height:        Intake/Output Summary (Last 24 hours) at 10/17/2022 1316 Last data filed at 10/17/2022 1120 Gross per 24 hour  Intake 988.05 ml  Output 3825 ml  Net -2836.95 ml   Filed Weights   10/13/22 0429  Weight: 54.4 kg    Examination:  General exam: Appears comfortable, deconditioned, not in any acute distress. Respiratory system: CTA bilaterally. Respiratory effort normal.  RR 14 Cardiovascular system: S1 & S2 heard, RRR. No JVD, murmurs, rubs, gallops or clicks. Gastrointestinal system: Abdomen is non distended, soft and non tender. Normal bowel sounds heard. Central nervous system: Alert and oriented x 2. No focal neurological deficits. Extremities: No edema, no cyanosis, no clubbing Skin: No rashes, lesions or ulcers Psychiatry: Judgement and insight appear normal. Mood & affect appropriate.     Data Reviewed: I have personally reviewed following labs and imaging studies  CBC: Recent Labs  Lab 10/13/22 0425 10/14/22 0556 10/15/22 0443  WBC 4.5 4.9 7.2  NEUTROABS 2.8  --   --   HGB 10.8* 10.1* 9.8*  HCT 35.8* 32.9* 32.6*  MCV 98.6 99.4 100.9*  PLT 186 174 152   Basic Metabolic Panel: Recent Labs  Lab 10/13/22 0425 10/14/22 0556 10/15/22 0443  NA 135 136 139  K 3.4* 4.6 4.4  CL 96* 100 102  CO2 22 24 24   GLUCOSE 111* 227* 275*  BUN 10 9 13   CREATININE 1.00 0.74 0.86  CALCIUM 8.3* 8.4* 8.6*  MG  --   --  1.7  PHOS  --   --  3.7   GFR: Estimated Creatinine Clearance: 58.2 mL/min (by C-G formula based on SCr of 0.86 mg/dL). Liver Function Tests: Recent Labs  Lab 10/13/22 0425 10/14/22 0556 10/15/22 0443  AST 31 26 23   ALT 18 16 17   ALKPHOS 78 70 66  BILITOT 1.2 0.9 0.8  PROT 5.9* 5.6* 5.1*  ALBUMIN 2.6* 2.4* 2.2*   Recent Labs  Lab 10/13/22 0425  LIPASE 297*   No results for input(s): "AMMONIA" in the last 168 hours. Coagulation Profile: No results for  input(s): "INR", "PROTIME" in the last 168 hours. Cardiac Enzymes: No results for input(s): "CKTOTAL", "CKMB", "CKMBINDEX", "TROPONINI" in the last 168 hours. BNP (last 3 results) No results for input(s): "PROBNP" in the last 8760 hours. HbA1C: No results for input(s): "HGBA1C" in the last 72 hours. CBG: Recent Labs  Lab 10/16/22 1137 10/16/22 1655 10/16/22 2112 10/17/22 0735 10/17/22 1214  GLUCAP 249* 149* 208* 253* 279*   Lipid Profile: No results for input(s): "CHOL", "HDL", "LDLCALC", "TRIG", "CHOLHDL", "LDLDIRECT" in the last 72 hours. Thyroid Function Tests: No results for input(s): "TSH", "T4TOTAL", "FREET4", "T3FREE", "THYROIDAB" in the last 72 hours. Anemia Panel: No results for input(s): "VITAMINB12", "FOLATE", "FERRITIN", "TIBC", "IRON", "RETICCTPCT" in the last 72 hours. Sepsis Labs: No results for input(s): "PROCALCITON", "LATICACIDVEN" in the last 168 hours.  No results found for this or any previous visit (from the past 240 hour(s)).   Radiology Studies: No results found.  Scheduled Meds:  antiseptic oral rinse  15 mL Mouth Rinse BID  Chlorhexidine Gluconate Cloth  6 each Topical Daily   dexamethasone (DECADRON) injection  4 mg Intravenous Q6H   docusate sodium  100 mg Oral BID   fluconazole  800 mg Oral Daily   folic acid  1 mg Oral Daily   insulin aspart  0-15 Units Subcutaneous TID WC   insulin aspart  3 Units Subcutaneous TID WC   insulin glargine-yfgn  5 Units Subcutaneous Daily   levETIRAcetam  1,000 mg Oral BID   levofloxacin  500 mg Oral Daily   lipase/protease/amylase  72,000 Units Oral TID WC   pantoprazole  40 mg Oral Daily   senna-docusate  1 tablet Oral BID   sodium chloride flush  10-40 mL Intracatheter Q12H   Continuous Infusions:  lactated ringers 100 mL/hr at 10/16/22 2323     LOS: 3 days    Time spent: 35 mins.    Willeen Niece, MD [facility]   If 7PM-7AM, please contact night-coverage

## 2022-10-17 NOTE — Progress Notes (Deleted)
Hypoglycemic Event  CBG: 56  Treatment: 8 oz juice/soda  Symptoms: None  Follow-up CBG: Time:0641 CBG Result:88  Possible Reasons for Event: Inadequate meal intake  Comments/MD notified:NP Virgel Manifold notified    Mc Hollen C Giorgia Wahler

## 2022-10-17 NOTE — Plan of Care (Signed)
  Problem: Fluid Volume: Goal: Ability to maintain a balanced intake and output will improve Outcome: Progressing   Problem: Education: Goal: Knowledge of General Education information will improve Description: Including pain rating scale, medication(s)/side effects and non-pharmacologic comfort measures Outcome: Progressing   Problem: Clinical Measurements: Goal: Cardiovascular complication will be avoided Outcome: Progressing   Problem: Activity: Goal: Risk for activity intolerance will decrease Outcome: Progressing   Problem: Coping: Goal: Level of anxiety will decrease Outcome: Progressing   Problem: Elimination: Goal: Will not experience complications related to bowel motility Outcome: Progressing

## 2022-10-17 NOTE — Progress Notes (Signed)
Mobility Specialist - Progress Note   10/17/22 0900  Mobility  Activity Transferred to/from [facility]  Level of Assistance Moderate assist, patient does 50-74%  Assistive Device BSC  Range of Motion/Exercises Active Assistive  Activity Response Tolerated fair  Mobility Referral Yes  $Mobility charge 1 Mobility  Mobility Specialist Start Time (ACUTE ONLY) [id number]  Mobility Specialist Stop Time (ACUTE ONLY) 0858  Mobility Specialist Time Calculation (min) (ACUTE ONLY) 16 min   Pt was found in bed wanting to use BSC. Was mod-A for bed mobility going from supine to sitting EOB. Pt was able to stand and pivot with cues. Afterwards to recliner chair. Pt c/o "freezing" and unable to have BM. At EOS was left on recliner chair with all needs met. Call bell in reach and chair alarm on.  Billey Chang Mobility Specialist

## 2022-10-17 NOTE — TOC Progression Note (Addendum)
Transition of Care [facility]) - Progression Note    Patient Details  Name: LABELLE TREMONTI MRN: [id number] Date of Birth: 07/21/1960  Transition of Care [facility]) CM/SW Contact  [name], RN Phone Number: 10/17/2022, 11:30 AM  Clinical Narrative:   Met with pt at bedside to review PT recommendation for [facility] PT, pt agreeable, no preference. Pt confirmed she  has a PCP and pharmacy in place, lives with her mother, reports no current home care services, has a walker at home she uses as needed for ambulation. TOC will follow for transportation needs at discharge  -2:38pm [facility] rep-[facility], accepted for [facility] PT/OT.         Expected Discharge Plan and Services                                               Social Determinants of Health (SDOH) Interventions SDOH Screenings   Food Insecurity: No Food Insecurity (10/14/2022)  Housing: Low Risk  (10/14/2022)  Transportation Needs: No Transportation Needs (10/14/2022)  Utilities: Not At Risk (10/14/2022)  Depression (PHQ2-9): Medium Risk (07/12/2022)  Tobacco Use: Medium Risk (10/14/2022)    Readmission Risk Interventions    10/08/2022    4:55 PM 09/24/2022    3:55 PM 09/07/2022    2:44 PM  Readmission Risk Prevention Plan  Transportation Screening Complete Complete Complete  Medication Review Oceanographer) Complete Complete Complete  PCP or Specialist appointment within 3-5 days of discharge Complete Complete Complete  HRI or Home Care Consult Complete Complete Complete  SW Recovery Care/Counseling Consult Complete Complete Complete  Palliative Care Screening Not Applicable Not Applicable Not Applicable  Skilled Nursing Facility Not Applicable Not Applicable Not Applicable

## 2022-10-18 DIAGNOSIS — R569 Unspecified convulsions: Secondary | ICD-10-CM | POA: Diagnosis not present

## 2022-10-18 LAB — GLUCOSE, CAPILLARY
Glucose-Capillary: 272 mg/dL — ABNORMAL HIGH (ref 70–99)
Glucose-Capillary: 233 mg/dL — ABNORMAL HIGH (ref 70–99)
Glucose-Capillary: 250 mg/dL — ABNORMAL HIGH (ref 70–99)

## 2022-10-18 MED ORDER — ZOLPIDEM TARTRATE 5 MG PO TABS
5.0000 mg | ORAL_TABLET | Freq: Every evening | ORAL | Status: DC | PRN
  Administered 2022-10-18: 5 mg via ORAL
  Filled 2022-10-18: qty 1

## 2022-10-18 MED ORDER — INSULIN GLARGINE-YFGN 100 UNIT/ML ~~LOC~~ SOLN
10.0000 [IU] | Freq: Every day | SUBCUTANEOUS | Status: DC
  Administered 2022-10-19 – 2022-10-23 (×5): 10 [IU] via SUBCUTANEOUS
  Filled 2022-10-18 (×5): qty 0.1

## 2022-10-18 NOTE — Progress Notes (Signed)
PT Cancellation Note  Patient Details Name: Carolyn Hooper MRN: [id number] DOB: Jan 25, 1961   Cancelled Treatment:    Reason Eval/Treat Not Completed: Medical issues which prohibited therapy, per RN, no a good time .  Blanchard Kelch PT Acute Rehabilitation Services Office [phone] Weekend pager-[phone]    Rada Hay 10/18/2022, 5:04 PM

## 2022-10-18 NOTE — Progress Notes (Signed)
PROGRESS NOTE    Carolyn Hooper  ZOX:[id number] DOB: May 25, 1960 DOA: 10/13/2022  PCP: [name], FNP   Brief Narrative: This 63 yrs old female with medical history significant of hidradenitis suppurativa complicated by abscess, anxiety, type 2 diabetes, hyperinsulinemia, hypertension, gestational preeclampsia, auditory neuroma, history of pancreatitis, vertigo, seizures, history of lung cancer,  metastatic to brain who is brought to the emergency department by a relative after having seizures for the past 2 days. She describes her being unable to talk and fast blinking of her eyelids.  Patient also continued to complaining about abdominal pain from pancreatitis. Pertinent  labs include lipase 297, CT head showing progression of left frontal metastasis associated with vasogenic edema since 2 weeks ago.  Mild new mass effect on the left lateral ventricle but no midline shift or other complicating features.  Patient is admitted for further evaluation and started on Decadron as per Dr. Liana Gerold recommendation.  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 Case was discussed with neuro-oncology Dr. Barbaraann Cao. Continue dexamethasone 4 mg IVP every 6 hours. Continue Keppra 1000 mg p.o. twice daily. Seizure precautions /neurochecks. Discussed the case with Dr. Barbaraann Cao he recommended the patient can be discharged if she is doing better on Decadron and he will follow-up outpatient.   Abdominal pain secondary to acute pancreatitis: Continue IV fluids. Analgesics as needed. Antiemetics as needed. Continue  Pantoprazole 40 mg p.o. daily. Pancreatic enzymes before meals/snacks.   Pancreatic pseudocyst: Positive for Candida and Streptococcus viridans. Continue fluconazole 800 mg p.o. daily per ID. Continue Levaquin 500 mg p.o. daily.   Type 2 diabetes with hyperglycemia: Last HbA1c was 8.4% on 09/04/2022. Carbohydrate modified diet. CBG monitoring with RI SS. Semglee increased to 10 units daily   GAD (generalized anxiety disorder) Continue Klonopin 0.5 mg every 12 hours as needed.   GERD (gastroesophageal reflux disease) Continue pantoprazole 40 mg daily Antiacid, H2 blocker as needed.   Hypokalemia Replaced.  Resolved.  DVT prophylaxis:SCDs Code Status: DNR Family Communication: No family at bed side. Disposition Plan:   Status is: Inpatient Remains inpatient appropriate because: Admitted for seizures likely in the setting of brain metastasis.  Neuro-oncology was consulted recommended dexamethasone and Keppra.  Anticipated DC home with [facility].    Consultants:  Neuro oncologist  Procedures:  Antimicrobials: Anti-infectives (From admission, onward)    Start     Dose/Rate Route Frequency Ordered Stop   10/13/22 1900  levofloxacin (LEVAQUIN) tablet 500 mg        500 mg Oral Daily 10/13/22 1812     10/13/22 1600  fluconazole (DIFLUCAN) tablet 800 mg        800 mg Oral Daily 10/13/22 1009 10/23/22 2359   10/13/22 1005  valACYclovir (VALTREX) tablet 500 mg        500 mg Oral Daily PRN 10/13/22 1009        Subjective: Patient was seen and examined at bedside.  Overnight events noted. Patient reports doing better.  She wants to know more about her treatment plan. Patient is taking dexamethasone and blood sugar has been running high. Seems depressed and anxious.  Objective: Vitals:   10/17/22 0452 10/17/22 1344 10/17/22 2238 10/18/22 0458  BP: 120/78 (!) 141/86 123/87 119/82  Pulse: 88 87 75 81  Resp: 14  20 14   Temp: (!) 97.5 F (36.4 C)  97.6 F (36.4 C) (!) 97.5 F  (36.4 C)  TempSrc: Oral  Oral Oral  SpO2: 98% 99% 100% 98%  Weight:      Height:        Intake/Output Summary (Last 24 hours) at 10/18/2022 1427 Last data filed at 10/18/2022 0800 Gross per 24 hour  Intake 4533.08 ml  Output 1500 ml  Net 3033.08 ml   Filed Weights   10/13/22 0429  Weight: 54.4 kg    Examination:  General exam: Appears Comfortable, deconditioned, not in any distress. Respiratory system: CTA bilaterally. Respiratory effort normal.  RR 15 Cardiovascular system: S1 & S2 heard, RRR. No JVD, murmurs, rubs, gallops or clicks. Gastrointestinal system: Abdomen is non distended, soft and non tender. Normal bowel sounds heard. Central nervous system: Alert and oriented x 2. No focal neurological deficits. Extremities: No edema, no cyanosis, no clubbing Skin: No rashes, lesions or ulcers Psychiatry: Judgement and insight appear normal. Mood & affect appropriate.     Data Reviewed: I have personally reviewed following labs and imaging studies  CBC: Recent Labs  Lab 10/13/22 0425 10/14/22 0556 10/15/22 0443 10/18/22 0329  WBC 4.5 4.9 7.2 8.3  NEUTROABS 2.8  --   --   --   HGB 10.8* 10.1* 9.8* 10.5*  HCT 35.8* 32.9* 32.6* 34.8*  MCV 98.6 99.4 100.9* 99.7  PLT 186 174 152 146*   Basic Metabolic Panel: Recent Labs  Lab 10/13/22 0425 10/14/22 0556 10/15/22 0443 10/18/22 0329  NA 135 136 139 138  K 3.4* 4.6 4.4 3.7  CL 96* 100 102 99  CO2 22 24 24 30   GLUCOSE 111* 227* 275* 270*  BUN 10 9 13 14   CREATININE 1.00 0.74 0.86 0.74  CALCIUM 8.3* 8.4* 8.6* 8.8*  MG  --   --  1.7 1.8  PHOS  --   --  3.7 3.5   GFR: Estimated Creatinine Clearance: 62.6 mL/min (by C-G formula based on SCr of 0.74 mg/dL). Liver Function Tests: Recent Labs  Lab 10/13/22 0425 10/14/22 0556 10/15/22 0443  AST 31 26 23   ALT 18 16 17   ALKPHOS 78 70 66  BILITOT 1.2 0.9 0.8  PROT 5.9* 5.6* 5.1*  ALBUMIN 2.6* 2.4* 2.2*   Recent Labs  Lab 10/13/22 0425  LIPASE 297*   No  results for input(s): "AMMONIA" in the last 168 hours. Coagulation Profile: No results for input(s): "INR", "PROTIME" in the last 168 hours. Cardiac Enzymes: No results for input(s): "CKTOTAL", "CKMB", "CKMBINDEX", "TROPONINI" in the last 168 hours. BNP (last 3 results) No results for input(s): "PROBNP" in the last 8760 hours. HbA1C: No results for input(s): "HGBA1C" in the last 72 hours. CBG: Recent Labs  Lab 10/17/22 1214 10/17/22 1604 10/17/22 2234 10/18/22 0750 10/18/22 1115  GLUCAP 279* 229* 215* 272* 233*   Lipid Profile: No results for input(s): "CHOL", "HDL", "LDLCALC", "TRIG", "CHOLHDL", "LDLDIRECT" in the last 72 hours. Thyroid Function Tests: No results for input(s): "TSH", "T4TOTAL", "FREET4", "T3FREE", "THYROIDAB" in the last 72 hours. Anemia Panel: No results for input(s): "VITAMINB12", "FOLATE", "FERRITIN", "TIBC", "IRON", "RETICCTPCT" in the last 72 hours. Sepsis Labs: No results for input(s): "PROCALCITON", "LATICACIDVEN" in the last 168 hours.  No results found for this or any previous visit (from the past 240  hour(s)).   Radiology Studies: No results found.  Scheduled Meds:  antiseptic oral rinse  15 mL Mouth Rinse BID   Chlorhexidine Gluconate Cloth  6 each Topical Daily   dexamethasone (DECADRON) injection  4 mg Intravenous Q6H   docusate sodium  100 mg Oral BID   fluconazole  800 mg Oral Daily   folic acid  1 mg Oral Daily   insulin aspart  0-15 Units Subcutaneous TID WC   insulin aspart  3 Units Subcutaneous TID WC   [START ON 10/19/2022] insulin glargine-yfgn  10 Units Subcutaneous Daily   levETIRAcetam  1,000 mg Oral BID   levofloxacin  500 mg Oral Daily   lipase/protease/amylase  72,000 Units Oral TID WC   pantoprazole  40 mg Oral Daily   senna-docusate  1 tablet Oral BID   sodium chloride flush  10-40 mL Intracatheter Q12H   Continuous Infusions:  lactated ringers 100 mL/hr at 10/18/22 0554     LOS: 4 days    Time spent: 35  mins.    Willeen Niece, MD [facility]   If 7PM-7AM, please contact night-coverage

## 2022-10-18 NOTE — Inpatient Diabetes Management (Signed)
Inpatient Diabetes Program Recommendations  AACE/ADA: New Consensus Statement on Inpatient Glycemic Control (2015)  Target Ranges:  Prepandial:   less than 140 mg/dL      Peak postprandial:   less than 180 mg/dL (1-2 hours)      Critically ill patients:  140 - 180 mg/dL   Lab Results  Component Value Date   GLUCAP 272 (H) 10/18/2022   HGBA1C 8.4 (H) 09/04/2022    Review of Glycemic Control  Latest Reference Range & Units 10/17/22 07:35 10/17/22 12:14 10/17/22 16:04 10/17/22 22:34 10/18/22 07:50  Glucose-Capillary 70 - 99 mg/dL 540 (H) 981 (H) 191 (H) 215 (H) 272 (H)  (H): Data is abnormally high  History: DM   Home DM Meds: Lantus 15 units every day (Not taking) Metformin 1000 mg BID(not taking)   Current Orders:  Novolog Moderate Correction Scale/ SSI (0-15 units) TID AC, Novolog 3 units TID with meals, Semglee 5 units every day, Decadron 4 mg Q6H    Please consider increasing basal insulin;  Semglee 10 units every day  Will continue to follow while inpatient.  Thank you, Dulce Sellar, MSN, CDCES Diabetes Coordinator Inpatient Diabetes Program [phone] (team pager from 8a-5p)

## 2022-10-19 ENCOUNTER — Other Ambulatory Visit: Payer: Self-pay

## 2022-10-19 DIAGNOSIS — F4323 Adjustment disorder with mixed anxiety and depressed mood: Secondary | ICD-10-CM | POA: Diagnosis not present

## 2022-10-19 DIAGNOSIS — G4700 Insomnia, unspecified: Secondary | ICD-10-CM | POA: Diagnosis not present

## 2022-10-19 DIAGNOSIS — R569 Unspecified convulsions: Secondary | ICD-10-CM | POA: Diagnosis not present

## 2022-10-19 DIAGNOSIS — F329 Major depressive disorder, single episode, unspecified: Secondary | ICD-10-CM | POA: Diagnosis not present

## 2022-10-19 LAB — GLUCOSE, CAPILLARY
Glucose-Capillary: 178 mg/dL — ABNORMAL HIGH (ref 70–99)
Glucose-Capillary: 209 mg/dL — ABNORMAL HIGH (ref 70–99)
Glucose-Capillary: 225 mg/dL — ABNORMAL HIGH (ref 70–99)
Glucose-Capillary: 246 mg/dL — ABNORMAL HIGH (ref 70–99)
Glucose-Capillary: 250 mg/dL — ABNORMAL HIGH (ref 70–99)

## 2022-10-19 MED ORDER — MIRTAZAPINE 15 MG PO TABS
7.5000 mg | ORAL_TABLET | Freq: Every day | ORAL | Status: DC
  Administered 2022-10-19: 7.5 mg via ORAL
  Filled 2022-10-19: qty 1

## 2022-10-19 NOTE — Plan of Care (Signed)
  Problem: Skin Integrity: Goal: Risk for impaired skin integrity will decrease Outcome: Progressing   Problem: Activity: Goal: Risk for activity intolerance will decrease Outcome: Progressing   Problem: Coping: Goal: Level of anxiety will decrease Outcome: Progressing

## 2022-10-19 NOTE — Progress Notes (Signed)
PROGRESS NOTE    Carolyn Hooper  ZOX:[id number] DOB: [birth year] DOA: 10/13/2022  PCP: [name], FNP   Brief Narrative: This 62 yrs old female with medical history significant of hidradenitis suppurativa complicated by abscess, anxiety, type 2 diabetes, hyperinsulinemia, hypertension, gestational preeclampsia, auditory neuroma, history of pancreatitis, vertigo, seizures, history of lung cancer,  metastatic to brain who is brought to the emergency department by a relative after having seizures for the past 2 days. She describes her being unable to talk and fast blinking of her eyelids.  Patient also continued to complaining about abdominal pain from pancreatitis. Pertinent  labs include lipase 297, CT head showing progression of left frontal metastasis associated with vasogenic edema since 2 weeks ago.  Mild new mass effect on the left lateral ventricle but no midline shift or other complicating features.  Patient is admitted for further evaluation and started on Decadron as per Dr. Liana Gerold recommendation.  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 Case was discussed with neuro-oncology Dr. Barbaraann Cao. Continue dexamethasone 4 mg IVP every 6 hours. Continue Keppra 1000 mg p.o. twice daily. Seizure precautions /neurochecks. Discussed the case with Dr. Barbaraann Cao,  he recommended the patient can be discharged if she is doing better on Decadron and he will follow-up outpatient.   Abdominal pain secondary to acute pancreatitis: Continue IV fluids. Analgesics as needed. Antiemetics as  needed. Continue Pantoprazole 40 mg p.o. daily. Pancreatic enzymes before meals/snacks.   Pancreatic pseudocyst: Positive for Candida and Streptococcus viridans. Continue fluconazole 800 mg p.o. daily per ID. Continue Levaquin 500 mg p.o. daily.   Type 2 diabetes with hyperglycemia: Last HbA1c was 8.4% on 09/04/2022. Carbohydrate modified diet. CBG monitoring with RI SS. Semglee increased to 10 units daily   GAD (generalized anxiety disorder) Continue Klonopin 0.5 mg every 12 hours as needed.   GERD (gastroesophageal reflux disease) Continue pantoprazole 40 mg daily Antiacid, H2 blocker as needed.   Hypokalemia Replaced.  Resolved.  Suicidal thoughts: Patient has told staff that she has thoughts about hurting herself.   Environmental education officer ordered.  Psychiatry is consulted,  awaiting recommendation.   DVT prophylaxis:SCDs Code Status: DNR Family Communication: No family at bed side. Disposition Plan:   Status is: Inpatient Remains inpatient appropriate because: Admitted for seizures likely in the setting of brain metastasis.  Neuro-oncology was consulted,  recommended dexamethasone and Keppra.  Anticipated DC home with [facility].    Consultants:  Neuro oncologist  Procedures:  Antimicrobials: Anti-infectives (From admission, onward)    Start     Dose/Rate Route Frequency Ordered Stop   10/13/22 1900  levofloxacin (LEVAQUIN) tablet 500 mg        500 mg Oral Daily 10/13/22 1812     10/13/22 1600  fluconazole (DIFLUCAN) tablet 800 mg        800 mg Oral Daily 10/13/22 1009 10/23/22 2359   10/13/22 1005  valACYclovir (VALTREX) tablet 500 mg        500 mg Oral Daily PRN 10/13/22 1009        Subjective: Patient was seen and examined at bedside. Overnight events noted.  Patient reports doing better, she has thoughts about hurting herself yesterday. She seemed so depressed and anxious. Safety sitter is at bedside    Objective: Vitals:   10/18/22 0458 10/18/22  2013 10/19/22 0439 10/19/22 0540  BP: 119/82 (!) 145/89 (!) 143/76 (!) 141/66  Pulse: 81 83 81 68  Resp: 14 16 15 20   Temp: (!) 97.5 F (36.4 C)  98.1 F (36.7 C) 98.7 F (37.1 C)  TempSrc: Oral   Oral  SpO2: 98% 98% 96% 94%  Weight:      Height:        Intake/Output Summary (Last 24 hours) at 10/19/2022 1117 Last data filed at 10/19/2022 0926 Gross per 24 hour  Intake 1924.54 ml  Output 2300 ml  Net -375.46 ml   Filed Weights   10/13/22 0429  Weight: 54.4 kg    Examination:  General exam: Appears comfortable, deconditioned, not in any distress. Respiratory system: CTA bilaterally. Respiratory effort normal.  RR 15 Cardiovascular system: S1 & S2 heard, RRR. No JVD, murmurs, rubs, gallops or clicks. Gastrointestinal system: Abdomen is non distended, soft and non tender. Normal bowel sounds heard. Central nervous system: Alert and oriented x 2. No focal neurological deficits. Extremities: No edema, no cyanosis, no clubbing Skin: No rashes, lesions or ulcers Psychiatry:  Mood & affect appropriate.     Data Reviewed: I have personally reviewed following labs and imaging studies  CBC: Recent Labs  Lab 10/13/22 0425 10/14/22 0556 10/15/22 0443 10/18/22 0329 10/19/22 0453  WBC 4.5 4.9 7.2 8.3 8.3  NEUTROABS 2.8  --   --   --   --   HGB 10.8* 10.1* 9.8* 10.5* 10.4*  HCT 35.8* 32.9* 32.6* 34.8* 34.3*  MCV 98.6 99.4 100.9* 99.7 100.3*  PLT 186 174 152 146* 134*   Basic Metabolic Panel: Recent Labs  Lab 10/13/22 0425 10/14/22 0556 10/15/22 0443 10/18/22 0329  NA 135 136 139 138  K 3.4* 4.6 4.4 3.7  CL 96* 100 102 99  CO2 22 24 24 30   GLUCOSE 111* 227* 275* 270*  BUN 10 9 13 14   CREATININE 1.00 0.74 0.86 0.74  CALCIUM 8.3* 8.4* 8.6* 8.8*  MG  --   --  1.7 1.8  PHOS  --   --  3.7 3.5   GFR: Estimated Creatinine Clearance: 62.6 mL/min (by C-G formula based on SCr of 0.74 mg/dL). Liver Function Tests: Recent Labs  Lab 10/13/22 0425 10/14/22 0556  10/15/22 0443  AST 31 26 23   ALT 18 16 17   ALKPHOS 78 70 66  BILITOT 1.2 0.9 0.8  PROT 5.9* 5.6* 5.1*  ALBUMIN 2.6* 2.4* 2.2*   Recent Labs  Lab 10/13/22 0425  LIPASE 297*   No results for input(s): "AMMONIA" in the last 168 hours. Coagulation Profile: No results for input(s): "INR", "PROTIME" in the last 168 hours. Cardiac Enzymes: No results for input(s): "CKTOTAL", "CKMB", "CKMBINDEX", "TROPONINI" in the last 168 hours. BNP (last 3 results) No results for input(s): "PROBNP" in the last 8760 hours. HbA1C: No results for input(s): "HGBA1C" in the last 72 hours. CBG: Recent Labs  Lab 10/18/22 0750 10/18/22 1115 10/18/22 1632 10/19/22 0038 10/19/22 0740  GLUCAP 272* 233* 250* 225* 246*   Lipid Profile: No results for input(s): "CHOL", "HDL", "LDLCALC", "TRIG", "CHOLHDL", "LDLDIRECT" in the last 72 hours. Thyroid Function Tests: No results for input(s): "TSH", "T4TOTAL", "FREET4", "T3FREE", "THYROIDAB" in the last 72 hours. Anemia Panel: No results for input(s): "VITAMINB12", "FOLATE", "FERRITIN", "TIBC", "IRON", "RETICCTPCT"  in the last 72 hours. Sepsis Labs: No results for input(s): "PROCALCITON", "LATICACIDVEN" in the last 168 hours.  No results found for this or any previous visit (from the past 240 hour(s)).   Radiology Studies: No results found.  Scheduled Meds:  antiseptic oral rinse  15 mL Mouth Rinse BID   Chlorhexidine Gluconate Cloth  6 each Topical Daily   dexamethasone (DECADRON) injection  4 mg Intravenous Q6H   docusate sodium  100 mg Oral BID   fluconazole  800 mg Oral Daily   folic acid  1 mg Oral Daily   insulin aspart  0-15 Units Subcutaneous TID WC   insulin aspart  3 Units Subcutaneous TID WC   insulin glargine-yfgn  10 Units Subcutaneous Daily   levETIRAcetam  1,000 mg Oral BID   levofloxacin  500 mg Oral Daily   lipase/protease/amylase  72,000 Units Oral TID WC   pantoprazole  40 mg Oral Daily   senna-docusate  1 tablet Oral BID    sodium chloride flush  10-40 mL Intracatheter Q12H   Continuous Infusions:  lactated ringers 100 mL/hr at 10/19/22 0253     LOS: 5 days    Time spent: 35 mins.    Willeen Niece, MD [facility]   If 7PM-7AM, please contact night-coverage

## 2022-10-19 NOTE — Consult Note (Signed)
[facility] Consult Evaluation  Service Date: October 19, 2022 LOS:  LOS: 5 days    Primary Psychiatric Diagnoses  Adjustment d/o with anxiety and depressed mood 2.  Insomnia 3.  MDD d/t medical condition  Assessment  Carolyn Hooper is a 62 y.o. female admitted medically for 10/13/2022  4:05 AM for seizures. She carries the psychiatric diagnoses of no formal dx and has a past medical history of  lung cancer metastatic to brain. Psychiatry was consulted for suicidal statements by Dr. Idelle Leech; exact statement not documented but was to nursing tech 1-2 days ago.   Patient's current presentation of poor sleep, decreased energy, decreased appetite and occasional low mood is most consistent with a diagnosis of adjustment disorder (all symptoms seem to be relatively new and tied to worsening in symptoms of metastatic cancer).  Some of her impulsivity (makes a couple of clearly inappropriate jokes during interview that she immediately regrets) might be mediated by new steroid prescription.  She has no prior episodes of suicidal ideation, self-harm, psychiatric inpatient admissions, etc and has a strong faith and grandchild being born within the next few months; no access to weapons.  She describes self as having a morbid sense of humor and is surprised (but ultimately reassured) that her statement is being taken so seriously.  She describes several reasons for living which are embedded into note.  She is open to psychotropic medication to address her sleep, appetite and mood-consents to mirtazapine after discussion of risk/benefits/side effects.  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    ## Safety and Observation Level:  - Based on my clinical evaluation, I estimate the patient to be at low risk of self harm in the current setting - At this time, we recommend a routine level of observation. This decision is based on my review of the chart including patient's history and current presentation, interview of the patient, mental status examination, and consideration of suicide risk including evaluating suicidal ideation, plan, intent, suicidal or self-harm behaviors, risk factors, and protective factors. This judgment is based on our ability to directly address suicide risk, implement suicide prevention strategies and develop a safety plan while the patient is in the clinical setting. Please contact our team if there is a concern that risk level has changed.  Suicide risk assessment  Patient has following modifiable risk factors for suicide: insomnia, adjustment d/o which we are addressing by prescribing mirtazapine.   Patient has following non-modifiable or demographic risk factors for suicide: none  Patient has the following protective factors against suicide: Supportive family, Cultural, spiritual, or religious beliefs that discourage suicide, Frustration tolerance, no history of suicide attempts, and no history of NSSIB  Thank you for this consult request. Recommendations have been communicated to the primary team.  We will see x1 at this time.    A   Psychiatric and Social  History   Relevant Aspects of Hospital Course:  Admitted on 10/13/2022 for seizures. They have been put on steroids.   Patient Report:  Patient seen in the late a.m.  She is fully alert and oriented, although has some minor word finding difficulties.  She has a good understanding of her overall medical condition and has been frustrated by lack of certainty and prognosis.  She is "embarrassed" by prior statements-states she was frustrated and attempting to use humor as a coping mechanism.  She does use humor a couple of times (fairly inappropriately) during examination, although stops short of making jokes about death.  In addition to uncertainty about prognosis, expresses frustration with new physical limitations and difficulty commanding the [place] language.  Does tell heart warming story about an aunt who suffered a stroke and with speech therapy was ultimately able to talk again (was making a point that she feels that her current life is worth living).  With several reasons to live including her son (first grand child being born probably in October), her mother, herself, her sisters, her relationship with God.  Generally her psychiatric symptoms listed below had gone up a little bit since she was diagnosed with cancer but have gotten much worse over the last few days.  Decreased appetite seems to have been going on the longest and she estimates having lost about 20 pounds over the last few months.  She is amenable to starting mirtazapine after discussing the risk/benefits/side effects including lightheadedness/orthostatic hypotension.  She is hoping that she will sleep well tonight and have increased appetite.  Psych ROS:  Depression: Brief periods of decreased mood, generally decreased sleep, energy and appetite.  Denies feelings of guilt/worthlessness.  Some difficulty concentrating. Anxiety: Endorses mild symptoms of generalized anxiety Mania (lifetime and current): No distractibility,  irritability, grandiosity, flight of ideas, increased goal-directed activity, decreased need for sleep (although not sleeping feels exhausted), pressured speech on exam Psychosis: (lifetime and current): 1 episode of seeing paint dripped down the wall while trying to fall asleep this hospitalization.  No lifetime episodes of psychosis  Collateral information:  none  Psychiatric History:  Information collected from patient  Prev Dx/Sx: Was diagnosed with some sort of anxiety disorder in her 88s, never medicated Current Psych Provider: None Home Meds (current): klonopin from PCP  Previous Med Trials: no prior med trial to her knowledge Therapy: none  Prior Psych Hospitalization: no  Prior Self Harm: no Prior Violence: no  Family Psych History: niece and nephew w/ depression Family Hx suicide: no  Social History:  Occupational Hx: postal servicd Legal Hx: non Living Situation: w/ mom since dx Spiritual Hx: quite spiritual, strong relationship with God Access to weapons: no  Substance History Tobacco use: quit when diagnosed.  Alcohol use: no Drug use: no   Exam Findings   Psychiatric Specialty Exam:  Presentation  General Appearance: Appropriate for Environment (short hair)  Eye Contact:Good  Speech:Clear and Coherent (rambling quality, monotone)  Speech Volume:Normal  Handedness:Right   Mood and Affect  Mood:-- ("I want to go home")  Affect:Appropriate; Full Range; Congruent   Thought Process  Thought Processes:Coherent; Goal Directed; Linear  Descriptions of Associations:Intact  Orientation:Full (Time, Place and Person)  Thought Content:-- (mild rumination)  Hallucinations:Hallucinations: None  Ideas of Reference:None  Suicidal Thoughts:Suicidal Thoughts: No  Homicidal  Thoughts:Homicidal Thoughts: No   Sensorium  Memory:Immediate Fair; Recent Fair; Remote Good  Judgment:Fair  Insight:Fair   Executive Functions   Concentration:Fair  Attention Span:Fair  Recall:Fair  Fund of Knowledge:Good  Language:Fair   Psychomotor Activity  Psychomotor Activity:Psychomotor Activity: Normal   Assets  Assets:Communication Skills; Desire for Improvement; Social Support   Sleep  Sleep:Sleep: Poor    Physical Exam: Vital signs:  Temp:  [98.1 F (36.7 C)-98.7 F (37.1 C)] 98.7 F (37.1 C) (08/03 0540) Pulse Rate:  [68-83] 68 (08/03 0540) Resp:  [15-20] 20 (08/03 0540) BP: (141-145)/(66-89) 141/66 (08/03 0540) SpO2:  [94 %-98 %] 94 % (08/03 0540) Physical Exam Eyes:     Conjunctiva/sclera: Conjunctivae normal.  Pulmonary:     Effort: Pulmonary effort is normal.  Neurological:     Mental Status: She is alert.     Blood pressure (!) 141/66, pulse 68, temperature 98.7 F (37.1 C), temperature source Oral, resp. rate 20, height 5\' 8"  (1.727 m), weight 54.4 kg, SpO2 94%. Body mass index is 18.25 kg/m.   Other History   These have been pulled in through the EMR, reviewed, and updated if appropriate.   Family History:  The patient's family history includes Arthritis in her father; COPD in her mother; Diabetes in her father; Heart disease in her father; Kidney disease in her father; Liver disease in her father.  Medical History: Past Medical History:  Diagnosis Date   Abscess    hydroadenitis superativa   Anxiety    DM (diabetes mellitus) (HCC)    Hyperinsulinemia    Hypertension 1983   gestational - pre-eclampsia   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[facility])    Neuroma    audiotry neuroma   Pancreatitis    Seizure (HCC)    Vertigo     Surgical History: Past Surgical History:  Procedure Laterality Date   ABDOMINAL HYSTERECTOMY  2006   remaining left ovary   BALLOON DILATION N/A 09/26/2022   Procedure: BALLOON DILATION;  Surgeon: Lemar Lofty., MD;  Location: [facility];  Service: Gastroenterology;  Laterality: N/A;   BIOPSY  09/26/2022   Procedure: BIOPSY;  Surgeon:  Meridee Score Netty Starring., MD;  Location: [facility];  Service: Gastroenterology;;   BRONCHIAL BIOPSY  02/19/2022   Procedure: BRONCHIAL BIOPSIES;  Surgeon: Josephine Igo, DO;  Location: [facility];  Service: Pulmonary;;   BRONCHIAL BRUSHINGS  02/19/2022   Procedure: BRONCHIAL BRUSHINGS;  Surgeon: Josephine Igo, DO;  Location: [facility];  Service: Pulmonary;;   BRONCHIAL NEEDLE ASPIRATION BIOPSY  02/19/2022   Procedure: BRONCHIAL NEEDLE ASPIRATION BIOPSIES;  Surgeon: Josephine Igo, DO;  Location: [facility];  Service: Pulmonary;;   ESOPHAGOGASTRODUODENOSCOPY (EGD) WITH PROPOFOL N/A 09/26/2022   Procedure: ESOPHAGOGASTRODUODENOSCOPY (EGD) WITH PROPOFOL;  Surgeon: Lemar Lofty., MD;  Location: [facility];  Service: Gastroenterology;  Laterality: N/A;   IR IMAGING GUIDED PORT INSERTION  03/29/2022   PANCREATIC STENT PLACEMENT  09/26/2022   Procedure: PANCREATIC STENT PLACEMENT;  Surgeon: Meridee Score Netty Starring., MD;  Location: [facility];  Service: Gastroenterology;;  axios   SHOULDER SURGERY Right 1960s   UPPER ESOPHAGEAL ENDOSCOPIC ULTRASOUND (EUS) N/A 09/26/2022   Procedure: UPPER ESOPHAGEAL ENDOSCOPIC ULTRASOUND (EUS);  Surgeon: Lemar Lofty., MD;  Location: [facility];  Service: Gastroenterology;  Laterality: N/A;  For cyst drainage probably will need stent    Medications:   Current Facility-Administered Medications:    acetaminophen (TYLENOL) tablet 650 mg, 650 mg, Oral, Q6H PRN, 650 mg at 10/19/22 0909 **OR** acetaminophen (  TYLENOL) suppository 650 mg, 650 mg, Rectal, Q6H PRN, Bobette Mo, MD   antiseptic oral rinse (BIOTENE) solution 15 mL, 15 mL, Mouth Rinse, BID, Shaffer, Luvenia Redden, NP, 15 mL at 10/19/22 0813   Chlorhexidine Gluconate Cloth 2 % PADS 6 each, 6 each, Topical, Daily, Bobette Mo, MD, 6 each at 10/19/22 0813   clonazePAM (KLONOPIN) tablet 0.5 mg, 0.5 mg, Oral, BID PRN, Bobette Mo, MD, 0.5 mg at 10/19/22 8469    dexamethasone (DECADRON) injection 4 mg, 4 mg, Intravenous, Q6H, Tilden Fossa, MD, 4 mg at 10/19/22 1648   docusate sodium (COLACE) capsule 100 mg, 100 mg, Oral, BID, Idelle Leech, Pardeep, MD, 100 mg at 10/19/22 0806   fluconazole (DIFLUCAN) tablet 800 mg, 800 mg, Oral, Daily, Idelle Leech, Pardeep, MD, 800 mg at 10/19/22 6295   folic acid (FOLVITE) tablet 1 mg, 1 mg, Oral, Daily, Bobette Mo, MD, 1 mg at 10/19/22 2841   insulin aspart (novoLOG) injection 0-15 Units, 0-15 Units, Subcutaneous, TID WC, Bobette Mo, MD, 3 Units at 10/19/22 1644   insulin aspart (novoLOG) injection 3 Units, 3 Units, Subcutaneous, TID WC, Willeen Niece, MD, 3 Units at 10/19/22 1645   insulin glargine-yfgn (SEMGLEE) injection 10 Units, 10 Units, Subcutaneous, Daily, Willeen Niece, MD, 10 Units at 10/19/22 3244   lactated ringers infusion, , Intravenous, Continuous, Tilden Fossa, MD, Last Rate: 100 mL/hr at 10/19/22 1322, New Bag at 10/19/22 1322   levETIRAcetam (KEPPRA) tablet 1,000 mg, 1,000 mg, Oral, BID, Bobette Mo, MD, 1,000 mg at 10/19/22 0804   levofloxacin (LEVAQUIN) tablet 500 mg, 500 mg, Oral, Daily, Idelle Leech, Pardeep, MD, 500 mg at 10/19/22 0805   lipase/protease/amylase (CREON) capsule 36,000 Units, 36,000 Units, Oral, PRN, Bobette Mo, MD, 36,000 Units at 10/17/22 1015   lipase/protease/amylase (CREON) capsule 72,000 Units, 72,000 Units, Oral, TID WC, Bobette Mo, MD, 72,000 Units at 10/19/22 1639   LORazepam (ATIVAN) injection 1 mg, 1 mg, Intravenous, Q4H PRN, Bobette Mo, MD   mirtazapine (REMERON) tablet 7.5 mg, 7.5 mg, Oral, QHS, ,  A   ondansetron (ZOFRAN) tablet 4 mg, 4 mg, Oral, Q6H PRN **OR** ondansetron (ZOFRAN) injection 4 mg, 4 mg, Intravenous, Q6H PRN, Bobette Mo, MD   oxyCODONE (Oxy IR/ROXICODONE) immediate release tablet 5 mg, 5 mg, Oral, Q8H PRN, Bobette Mo, MD, 5 mg at 10/19/22 1639   pantoprazole (PROTONIX) EC  tablet 40 mg, 40 mg, Oral, Daily, Bobette Mo, MD, 40 mg at 10/19/22 0102   prochlorperazine (COMPAZINE) tablet 5 mg, 5 mg, Oral, Q6H PRN, Bobette Mo, MD   senna-docusate (Senokot-S) tablet 1 tablet, 1 tablet, Oral, BID, Willeen Niece, MD, 1 tablet at 10/19/22 0804   sodium chloride flush (NS) 0.9 % injection 10-40 mL, 10-40 mL, Intracatheter, Q12H, Bobette Mo, MD, 10 mL at 10/18/22 7253   sodium chloride flush (NS) 0.9 % injection 10-40 mL, 10-40 mL, Intracatheter, PRN, Bobette Mo, MD   valACYclovir Ralph Dowdy) tablet 500 mg, 500 mg, Oral, Daily PRN, Bobette Mo, MD  Allergies: Allergies  Allergen Reactions   Adhesive [Tape] Dermatitis   Statins Itching and Other (See Comments)    Myalgias, also

## 2022-10-20 ENCOUNTER — Other Ambulatory Visit: Payer: Self-pay

## 2022-10-20 DIAGNOSIS — R569 Unspecified convulsions: Secondary | ICD-10-CM | POA: Diagnosis not present

## 2022-10-20 LAB — CBC
HCT: 35.2 % — ABNORMAL LOW (ref 36.0–46.0)
Hemoglobin: 10.9 g/dL — ABNORMAL LOW (ref 12.0–15.0)
MCH: 30.7 pg (ref 26.0–34.0)
MCHC: 31 g/dL (ref 30.0–36.0)
MCV: 99.2 fL (ref 80.0–100.0)
Platelets: 121 10*3/uL — ABNORMAL LOW (ref 150–400)
RBC: 3.55 MIL/uL — ABNORMAL LOW (ref 3.87–5.11)
RDW: 17.4 % — ABNORMAL HIGH (ref 11.5–15.5)
WBC: 8.5 10*3/uL (ref 4.0–10.5)
nRBC: 0 % (ref 0.0–0.2)

## 2022-10-20 LAB — BASIC METABOLIC PANEL WITH GFR
Anion gap: 10 (ref 5–15)
BUN: 15 mg/dL (ref 8–23)
CO2: 32 mmol/L (ref 22–32)
Calcium: 9.1 mg/dL (ref 8.9–10.3)
Chloride: 96 mmol/L — ABNORMAL LOW (ref 98–111)
Creatinine, Ser: 0.68 mg/dL (ref 0.44–1.00)
GFR, Estimated: >60 mL/min (ref >60)
Glucose, Bld: 267 mg/dL — ABNORMAL HIGH (ref 70–99)
Potassium: 3.5 mmol/L (ref 3.5–5.1)
Sodium: 138 mmol/L (ref 135–145)

## 2022-10-20 LAB — MAGNESIUM: Magnesium: 1.8 mg/dL (ref 1.7–2.4)

## 2022-10-20 LAB — GLUCOSE, CAPILLARY
Glucose-Capillary: 288 mg/dL — ABNORMAL HIGH (ref 70–99)
Glucose-Capillary: 364 mg/dL — ABNORMAL HIGH (ref 70–99)
Glucose-Capillary: 213 mg/dL — ABNORMAL HIGH (ref 70–99)
Glucose-Capillary: 169 mg/dL — ABNORMAL HIGH (ref 70–99)

## 2022-10-20 LAB — D-DIMER, QUANTITATIVE: D-Dimer, Quant: 2.31 ug/mL-FEU — ABNORMAL HIGH (ref 0.00–0.50)

## 2022-10-20 LAB — PHOSPHORUS: Phosphorus: 3.7 mg/dL (ref 2.5–4.6)

## 2022-10-20 MED ORDER — RAMELTEON 8 MG PO TABS
8.0000 mg | ORAL_TABLET | Freq: Every day | ORAL | Status: DC
  Administered 2022-10-20 – 2022-11-03 (×15): 8 mg via ORAL
  Filled 2022-10-20 (×17): qty 1

## 2022-10-20 NOTE — Progress Notes (Signed)
PROGRESS NOTE    Carolyn Hooper  WGN:[id number] DOB: 18-Apr-1960 DOA: 10/13/2022  PCP: [name], FNP   Brief Narrative: This 62 yrs old female with medical history significant of hidradenitis suppurativa complicated by abscess, anxiety, type 2 diabetes, hyperinsulinemia, hypertension, gestational preeclampsia, auditory neuroma, history of pancreatitis, vertigo, seizures, history of lung cancer,  metastatic to brain who is brought to the emergency department by a relative after having seizures for the past 2 days. She describes her being unable to talk and fast blinking of her eyelids.  Patient also continued to complaining about abdominal pain from pancreatitis. Pertinent  labs include lipase 297, CT head showing progression of left frontal metastasis associated with vasogenic edema since 2 weeks ago.  Mild new mass effect on the left lateral ventricle but no midline shift or other complicating features.  Patient is admitted for further evaluation and started on Decadron as per Dr. Liana Gerold recommendation.  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 Case was discussed with neuro-oncology Dr. Barbaraann Cao. Continue dexamethasone 4 mg IVP every 6 hours. Continue Keppra 1000 mg p.o. twice daily. Seizure precautions /neurochecks. Discussed the case with Dr. Barbaraann Cao,  he recommended the patient can be discharged if she is doing better on Decadron and he will follow-up outpatient.   Abdominal pain secondary to acute pancreatitis: Continue IV fluids. Analgesics as needed. Antiemetics as  needed. Continue Pantoprazole 40 mg p.o. daily. Pancreatic enzymes before meals/snacks.   Pancreatic pseudocyst: Positive for Candida and Streptococcus viridans. Continue fluconazole 800 mg p.o. daily per ID. Continue Levaquin 500 mg p.o. daily.   Type 2 diabetes with hyperglycemia: Last HbA1c was 8.4% on 09/04/2022. Carbohydrate modified diet. CBG monitoring with RI SS. Continue Semglee 10 units daily.   GAD (generalized anxiety disorder) Continue Klonopin 0.5 mg every 12 hours as needed.   GERD (gastroesophageal reflux disease) Continue pantoprazole 40 mg daily. Antiacid, H2 blocker as needed.   Hypokalemia Replaced.  Resolved.  Suicidal thoughts: Patient has told staff that she has thoughts about hurting herself.   Environmental education officer ordered.  Psychiatry is consulted, not a suicidal risk Safety sitter discontinued.   DVT prophylaxis:SCDs Code Status: DNR Family Communication: No family at bed side. Disposition Plan:   Status is: Inpatient Remains inpatient appropriate because: Admitted for seizures likely in the setting of brain metastasis.  Neuro-oncology was consulted,  recommended dexamethasone and Keppra.  Anticipated DC home with [facility] 10/21/22.    Consultants:  Neuro oncologist  Procedures:  Antimicrobials: Anti-infectives (From admission, onward)    Start     Dose/Rate Route Frequency Ordered Stop   10/13/22 1900  levofloxacin (LEVAQUIN) tablet 500 mg        500 mg Oral Daily 10/13/22 1812     10/13/22 1600  fluconazole (DIFLUCAN) tablet 800 mg        800 mg Oral Daily 10/13/22 1009 10/23/22 2359   10/13/22 1005  valACYclovir (VALTREX) tablet 500 mg        500 mg Oral Daily PRN 10/13/22 1009        Subjective: Patient was seen and examined at  bedside. Overnight events noted. Patient reports doing better.  She denies any thoughts about hurting herself. She seemed so depressed and anxious.  Safety sitter discontinued.   Objective: Vitals:    10/19/22 2046 10/20/22 0236 10/20/22 0452 10/20/22 0828  BP: (!) 164/71 102/68 (!) 155/82 (!) 130/95  Pulse: (!) 54 81 79 84  Resp: 16 16 20 18   Temp: 97.9 F (36.6 C) 97.9 F (36.6 C) (!) 97.5 F (36.4 C) 98.2 F (36.8 C)  TempSrc: Oral Oral Oral Oral  SpO2: 98% 90% 93% 92%  Weight:      Height:        Intake/Output Summary (Last 24 hours) at 10/20/2022 1147 Last data filed at 10/20/2022 0148 Gross per 24 hour  Intake 60 ml  Output 3750 ml  Net -3690 ml   Filed Weights   10/13/22 0429  Weight: 54.4 kg    Examination:  General exam: Appears deconditioned, but comfortable, not in any acute distress. Respiratory system: CTA bilaterally. Respiratory effort normal.  RR 15 Cardiovascular system: S1 & S2 heard, RRR. No JVD, murmurs, rubs, gallops or clicks. Gastrointestinal system: Abdomen is non distended, soft and non tender. Normal bowel sounds heard. Central nervous system: Alert and oriented x 2. No focal neurological deficits. Extremities: No edema, no cyanosis, no clubbing Skin: No rashes, lesions or ulcers Psychiatry:  Mood & affect appropriate.     Data Reviewed: I have personally reviewed following labs and imaging studies  CBC: Recent Labs  Lab 10/14/22 0556 10/15/22 0443 10/18/22 0329 10/19/22 0453 10/20/22 0226  WBC 4.9 7.2 8.3 8.3 8.5  HGB 10.1* 9.8* 10.5* 10.4* 10.9*  HCT 32.9* 32.6* 34.8* 34.3* 35.2*  MCV 99.4 100.9* 99.7 100.3* 99.2  PLT 174 152 146* 134* 121*   Basic Metabolic Panel: Recent Labs  Lab 10/14/22 0556 10/15/22 0443 10/18/22 0329 10/20/22 0226  NA 136 139 138 138  K 4.6 4.4 3.7 3.5  CL 100 102 99 96*  CO2 24 24 30  32  GLUCOSE 227* 275* 270* 267*  BUN 9 13 14 15   CREATININE 0.74 0.86 0.74 0.68  CALCIUM 8.4* 8.6* 8.8* 9.1  MG  --  1.7 1.8 1.8  PHOS  --  3.7 3.5 3.7   GFR: Estimated Creatinine Clearance: 62.6 mL/min (by C-G formula based on SCr of 0.68 mg/dL). Liver Function Tests: Recent Labs  Lab 10/14/22 0556  10/15/22 0443  AST 26 23  ALT 16 17  ALKPHOS 70 66  BILITOT 0.9 0.8  PROT 5.6* 5.1*  ALBUMIN 2.4* 2.2*   No results for input(s): "LIPASE", "AMYLASE" in the last 168 hours.  No results for input(s): "AMMONIA" in the last 168 hours. Coagulation Profile: No results for input(s): "INR", "PROTIME" in the last 168 hours. Cardiac Enzymes: No results for input(s): "CKTOTAL", "CKMB", "CKMBINDEX", "TROPONINI" in the last 168 hours. BNP (last 3 results) No results for input(s): "PROBNP" in the last 8760 hours. HbA1C: No results for input(s): "HGBA1C" in the last 72 hours. CBG: Recent Labs  Lab 10/19/22 1201 10/19/22 1624 10/19/22 2047 10/20/22 0834 10/20/22 1127  GLUCAP 250* 178* 209* 288* 364*   Lipid Profile: No results for input(s): "CHOL", "HDL", "LDLCALC", "TRIG", "CHOLHDL", "LDLDIRECT" in the last 72 hours. Thyroid Function Tests: No results for input(s): "TSH", "T4TOTAL", "FREET4", "T3FREE", "THYROIDAB" in the last 72 hours. Anemia Panel: No results for input(s): "VITAMINB12", "FOLATE", "FERRITIN", "TIBC", "IRON", "RETICCTPCT" in the last 72 hours. Sepsis Labs: No results for input(s): "PROCALCITON", "LATICACIDVEN" in the last 168  hours.  No results found for this or any previous visit (from the past 240 hour(s)).   Radiology Studies: No results found.  Scheduled Meds:  antiseptic oral rinse  15 mL Mouth Rinse BID   Chlorhexidine Gluconate Cloth  6 each Topical Daily   dexamethasone (DECADRON) injection  4 mg Intravenous Q6H   docusate sodium  100 mg Oral BID   fluconazole  800 mg Oral Daily   folic acid  1 mg Oral Daily   insulin aspart  0-15 Units Subcutaneous TID WC   insulin aspart  3 Units Subcutaneous TID WC   insulin glargine-yfgn  10 Units Subcutaneous Daily   levETIRAcetam  1,000 mg Oral BID   levofloxacin  500 mg Oral Daily   lipase/protease/amylase  72,000 Units Oral TID WC   mirtazapine  7.5 mg Oral QHS   pantoprazole  40 mg Oral Daily    senna-docusate  1 tablet Oral BID   sodium chloride flush  10-40 mL Intracatheter Q12H   Continuous Infusions:  lactated ringers 100 mL/hr at 10/20/22 1037     LOS: 6 days    Time spent: 35 mins.    Willeen Niece, MD [facility]   If 7PM-7AM, please contact night-coverage

## 2022-10-20 NOTE — Progress Notes (Signed)
This RN was called by Kendal Hymen at [facility] regarding patient being in atrial fibrillation.  Upon reviewing tele, pt noted to have high heart rate.  This RN notified Manuela Schwartz, NP and did EKG.  Pt confirmed to be in atrial fibrillations with rapid ventricular response per EKG.  Appears to be new onset, as there is no documented history in chart.  Provider made aware.  This RN notified to let provider know if heart rate sustains in the 150s.  No new orders provided at this time.  Labs drawn via IV team during this.  Provider made aware of this as well.  Pt otherwise has had no changes.  Will continue to monitor.   10/20/22 0236  Assess: MEWS Score  Temp 97.9 F (36.6 C)  BP 102/68  MAP (mmHg) 80  Pulse Rate 81  ECG Heart Rate (!) 129  Resp 16  SpO2 90 %  O2 Device Room Air  Assess: MEWS Score  MEWS Temp 0  MEWS Systolic 0  MEWS Pulse 2  MEWS RR 0  MEWS LOC 0  MEWS Score 2  MEWS Score Color Yellow  Assess: if the MEWS score is Yellow or Red  Were vital signs accurate and taken at a resting state? Yes  Does the patient meet 2 or more of the SIRS criteria? No  MEWS guidelines implemented  Yes, yellow  Treat  MEWS Interventions Considered administering scheduled or prn medications/treatments as ordered  Take Vital Signs  Increase Vital Sign Frequency  Yellow: Q2hr x1, continue Q4hrs until patient remains green for 12hrs  Escalate  MEWS: Escalate Yellow: Discuss with charge nurse and consider notifying provider and/or RRT  Provider Notification  Provider Name/Title Jon Billings, NP  Date Provider Notified 10/20/22  Time Provider Notified 0220  Method of Notification Page  Notification Reason Other (Comment) (pt in afib RVR, EKG confirmed)  Provider response No new orders;Evaluate remotely  Date of Provider Response 10/20/22  Time of Provider Response 0232  Assess: SIRS CRITERIA  SIRS Temperature  0  SIRS Pulse 1  SIRS Respirations  0  SIRS WBC 0  SIRS Score Sum  1

## 2022-10-20 NOTE — Plan of Care (Signed)
Cont with current plan of care

## 2022-10-20 NOTE — Progress Notes (Signed)
OT Cancellation Note  Patient Details Name: BETSI CRESPI MRN: [id number] DOB: 1960/12/01   Cancelled Treatment:    Reason Eval/Treat Not Completed: Patient not medically ready Patient resting in bed with HR up to 130s. OT to continue to follow and check back as schedule will allow.  Rosalio Loud, MS Acute Rehabilitation Department Office# [phone]  10/20/2022, 8:18 AM

## 2022-10-20 NOTE — Consult Note (Signed)
[facility] Psych[facility]ry Consult Evaluation  Service Date: October 20, 2022 LOS:  LOS: 6 days    Primary Psychiatric Diagnoses  Adjustment d/o with anxiety and depressed mood 2.  Insomnia 3.  MDD d/t medical condition  Assessment  Carolyn Hooper is a 62 y.o. female admitted medically for 10/13/2022  4:05 AM for seizures. She carries the psychiatric diagnoses of no formal dx and has a past medical history of  lung cancer metastatic to brain. Psychiatry was consulted for suicidal statements by Dr. Idelle Leech; exact statement not documented but was to nursing tech 1-2 days ago.   Patient's current presentation of poor sleep, decreased energy, decreased appetite and occasional low mood is most consistent with a diagnosis of adjustment disorder (all symptoms seem to be relatively new and tied to worsening in symptoms of metastatic cancer).  Some of her impulsivity (makes a couple of clearly inappropriate jokes during interview that she immediately regrets) might be mediated by new steroid prescription.  She has no prior episodes of suicidal ideation, self-harm, psychiatric inpatient admissions, etc and has a strong faith and grandchild being born within the next few months; no access to weapons.  She describes self as having a morbid sense of humor and is surprised (but ultimately reassured) that her statement is being taken so seriously.  She describes several reasons for living which are embedded into original note.  Had a bad experience with mirtazapine, open to rozerem for sleep - insomnia is largest modifiable risk factor for suicide.  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    ## Safety and Observation Level:  - Based on my clinical evaluation, I estimate the patient to be at low risk of self harm in the current setting - At this time, we recommend a routine level of observation. This decision is based on my review of the chart including patient's history and current presentation, interview of the patient, mental status examination, and consideration of suicide risk including evaluating suicidal ideation, plan, intent, suicidal or self-harm behaviors, risk factors, and protective factors. This judgment is based on our ability to directly address suicide risk, implement suicide prevention strategies and develop a safety plan while the patient is in the clinical setting. Please contact our team if there is a concern that risk level has changed.  Suicide risk assessment  Patient has following modifiable risk factors for suicide: insomnia, adjustment d/o which we are addressing by prescribing mirtazapine.   Patient has following non-modifiable or demographic risk factors for suicide: none  Patient has the following protective factors against suicide: Supportive family, Cultural, spiritual, or religious beliefs that discourage suicide, Frustration tolerance, no history of suicide attempts, and no history of NSSIB  Thank you for this consult request. Recommendations have been communicated to the primary team.  We will see x1 at this time.    A   Psychiatric and Social History   Relevant Aspects of  Hospital Course:  Admitted on 10/13/2022 for seizures. They have been put on steroids.   Patient Report:  He remembers this author from yesterday, although has some minor gaps in memory over the last 24 hours (has no recollection of discussion about Remeron yesterday - thinks it was started by primary team).  Says she felt "fuzzy" for hours after takign it but never fell asleep until a good nap this afternoon.  Expresses ongoing frustration with lack of autonomy in the hospital and not knowing when she will go home.  Feels she has to "yell" to get the attention of the nursing staff. Continues to deny suicidal ideations and remains frustrated that her "joke" was taken seriously and has warned her son not to be sarcastic around the medical team. Discussed sleep hygiene and recommended against 4 PM nap. Word finding difficulties ongoing (described infection where she makes insulin instead of pancreatitis, etc)   Psych ROS:  No SI, HI, AH/VH. Sleep remains poor but had good nap. No real change to appetite.  Depression: Brief periods of decreased mood, generally decreased sleep, energy and appetite.  Denies feelings of guilt/worthlessness.  Some difficulty concentrating. Anxiety: Endorses mild symptoms of generalized anxiety Mania (lifetime and current): No distractibility, irritability, grandiosity, flight of ideas, increased goal-directed activity, decreased need for sleep (although not sleeping feels exhausted), pressured speech on exam Psychosis: (lifetime and current): 1 episode of seeing paint dripped down the wall while trying to fall asleep this hospitalization.  No lifetime episodes of psychosis  Collateral information:  none  Psychiatric History:  Information collected from patient  Prev Dx/Sx: Was diagnosed with some sort of anxiety disorder in her 69s, never medicated Current Psych Provider: None Home Meds (current): klonopin from PCP  Previous Med Trials: no prior med trial to her  knowledge Therapy: none  Prior Psych Hospitalization: no  Prior Self Harm: no Prior Violence: no  Family Psych History: niece and nephew w/ depression Family Hx suicide: no  Social History:  Occupational Hx: postal servicd Legal Hx: non Living Situation: w/ mom since dx Spiritual Hx: quite spiritual, strong relationship with God Access to weapons: no  Substance History Tobacco use: quit when diagnosed.  Alcohol use: no Drug use: no   Exam Findings   Psychiatric Specialty Exam:  Presentation  General Appearance: Appropriate for Environment  Eye Contact:Good  Speech:Clear and Coherent  Speech Volume:Normal  Handedness:Right   Mood and Affect  Mood:-- (Better after my nap)  Affect:Appropriate; Congruent; Full Range   Thought Process  Thought Processes:Coherent; Goal Directed  Descriptions of Associations:Intact  Orientation:Full (Time, Place and Person)  Thought Content:Rumination  Hallucinations:Hallucinations: None  Ideas of Reference:None  Suicidal Thoughts:Suicidal Thoughts: No  Homicidal Thoughts:Homicidal Thoughts: No   Sensorium  Memory:Immediate Fair; Recent Fair; Remote Good  Judgment:Fair  Insight:Fair   Executive Functions  Concentration:Fair  Attention Span:Fair  Recall:Fair  Fund of Knowledge:Good  Language:Poor   Psychomotor Activity  Psychomotor Activity:Psychomotor Activity: Normal   Assets  Assets:Communication Skills; Desire for Improvement; Social Support   Sleep  Sleep:Sleep: Poor    Physical Exam: Vital signs:  Temp:  [97.5 F (36.4 C)-98.2 F (36.8 C)] 97.9 F (36.6 C) (08/04 1615) Pulse Rate:  [54-84] 68 (08/04 1615) Resp:  [16-20] 18 (08/04 1615) BP: (102-164)/(68-95) 158/70 (  08/04 1615) SpO2:  [90 %-100 %] 100 % (08/04 1615) Physical Exam Eyes:     Conjunctiva/sclera: Conjunctivae normal.  Pulmonary:     Effort: Pulmonary effort is normal.  Neurological:     Mental Status: She is  alert.     Blood pressure (!) 158/70, pulse 68, temperature 97.9 F (36.6 C), temperature source Oral, resp. rate 18, height 5\' 8"  (1.727 m), weight 54.4 kg, SpO2 100%. Body mass index is 18.25 kg/m.   Other History   These have been pulled in through the EMR, reviewed, and updated if appropriate.   Family History:  The patient's family history includes Arthritis in her father; COPD in her mother; Diabetes in her father; Heart disease in her father; Kidney disease in her father; Liver disease in her father.  Medical History: Past Medical History:  Diagnosis Date   Abscess    hydroadenitis superativa   Anxiety    DM (diabetes mellitus) (HCC)    Hyperinsulinemia    Hypertension 1983   gestational - pre-eclampsia   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[facility])    Neuroma    audiotry neuroma   Pancreatitis    Seizure (HCC)    Vertigo     Surgical History: Past Surgical History:  Procedure Laterality Date   ABDOMINAL HYSTERECTOMY  2006   remaining left ovary   BALLOON DILATION N/A 09/26/2022   Procedure: BALLOON DILATION;  Surgeon: Lemar Lofty., MD;  Location: [facility];  Service: Gastroenterology;  Laterality: N/A;   BIOPSY  09/26/2022   Procedure: BIOPSY;  Surgeon: Meridee Score Netty Starring., MD;  Location: [facility];  Service: Gastroenterology;;   BRONCHIAL BIOPSY  02/19/2022   Procedure: BRONCHIAL BIOPSIES;  Surgeon: Josephine Igo, DO;  Location: [facility];  Service: Pulmonary;;   BRONCHIAL BRUSHINGS  02/19/2022   Procedure: BRONCHIAL BRUSHINGS;  Surgeon: Josephine Igo, DO;  Location: [facility];  Service: Pulmonary;;   BRONCHIAL NEEDLE ASPIRATION BIOPSY  02/19/2022   Procedure: BRONCHIAL NEEDLE ASPIRATION BIOPSIES;  Surgeon: Josephine Igo, DO;  Location: [facility];  Service: Pulmonary;;   ESOPHAGOGASTRODUODENOSCOPY (EGD) WITH PROPOFOL N/A 09/26/2022   Procedure: ESOPHAGOGASTRODUODENOSCOPY (EGD) WITH PROPOFOL;  Surgeon: Lemar Lofty., MD;   Location: [facility];  Service: Gastroenterology;  Laterality: N/A;   IR IMAGING GUIDED PORT INSERTION  03/29/2022   PANCREATIC STENT PLACEMENT  09/26/2022   Procedure: PANCREATIC STENT PLACEMENT;  Surgeon: Meridee Score Netty Starring., MD;  Location: [facility];  Service: Gastroenterology;;  axios   SHOULDER SURGERY Right 1960s   UPPER ESOPHAGEAL ENDOSCOPIC ULTRASOUND (EUS) N/A 09/26/2022   Procedure: UPPER ESOPHAGEAL ENDOSCOPIC ULTRASOUND (EUS);  Surgeon: Lemar Lofty., MD;  Location: [facility];  Service: Gastroenterology;  Laterality: N/A;  For cyst drainage probably will need stent    Medications:   Current Facility-Administered Medications:    acetaminophen (TYLENOL) tablet 650 mg, 650 mg, Oral, Q6H PRN, 650 mg at 10/20/22 0242 **OR** acetaminophen (TYLENOL) suppository 650 mg, 650 mg, Rectal, Q6H PRN, Bobette Mo, MD   antiseptic oral rinse (BIOTENE) solution 15 mL, 15 mL, Mouth Rinse, BID, Shaffer, Luvenia Redden, NP, 15 mL at 10/20/22 1610   Chlorhexidine Gluconate Cloth 2 % PADS 6 each, 6 each, Topical, Daily, Bobette Mo, MD, 6 each at 10/20/22 0930   clonazePAM (KLONOPIN) tablet 0.5 mg, 0.5 mg, Oral, BID PRN, Bobette Mo, MD, 0.5 mg at 10/19/22 2101   dexamethasone (DECADRON) injection 4 mg, 4 mg, Intravenous, Q6H, Tilden Fossa, MD, 4 mg at 10/20/22  1729   docusate sodium (COLACE) capsule 100 mg, 100 mg, Oral, BID, Khatri, Pardeep, MD, 100 mg at 10/20/22 0937   fluconazole (DIFLUCAN) tablet 800 mg, 800 mg, Oral, Daily, Idelle Leech, Pardeep, MD, 800 mg at 10/20/22 1610   folic acid (FOLVITE) tablet 1 mg, 1 mg, Oral, Daily, Bobette Mo, MD, 1 mg at 10/20/22 0936   insulin aspart (novoLOG) injection 0-15 Units, 0-15 Units, Subcutaneous, TID WC, Bobette Mo, MD, 8 Units at 10/20/22 1727   insulin aspart (novoLOG) injection 3 Units, 3 Units, Subcutaneous, TID WC, Willeen Niece, MD, 3 Units at 10/20/22 1726   insulin glargine-yfgn (SEMGLEE)  injection 10 Units, 10 Units, Subcutaneous, Daily, Willeen Niece, MD, 10 Units at 10/20/22 9604   lactated ringers infusion, , Intravenous, Continuous, Tilden Fossa, MD, Last Rate: 100 mL/hr at 10/20/22 1037, New Bag at 10/20/22 1037   levETIRAcetam (KEPPRA) tablet 1,000 mg, 1,000 mg, Oral, BID, Bobette Mo, MD, 1,000 mg at 10/20/22 0936   levofloxacin (LEVAQUIN) tablet 500 mg, 500 mg, Oral, Daily, Idelle Leech, Pardeep, MD, 500 mg at 10/20/22 0935   lipase/protease/amylase (CREON) capsule 36,000 Units, 36,000 Units, Oral, PRN, Bobette Mo, MD, 36,000 Units at 10/17/22 1015   lipase/protease/amylase (CREON) capsule 72,000 Units, 72,000 Units, Oral, TID WC, Bobette Mo, MD, 72,000 Units at 10/20/22 1728   LORazepam (ATIVAN) injection 1 mg, 1 mg, Intravenous, Q4H PRN, Bobette Mo, MD   ondansetron [facility]) tablet 4 mg, 4 mg, Oral, Q6H PRN **OR** ondansetron (ZOFRAN) injection 4 mg, 4 mg, Intravenous, Q6H PRN, Bobette Mo, MD   oxyCODONE (Oxy IR/ROXICODONE) immediate release tablet 5 mg, 5 mg, Oral, Q8H PRN, Bobette Mo, MD, 5 mg at 10/20/22 0242   pantoprazole (PROTONIX) EC tablet 40 mg, 40 mg, Oral, Daily, Bobette Mo, MD, 40 mg at 10/20/22 5409   prochlorperazine (COMPAZINE) tablet 5 mg, 5 mg, Oral, Q6H PRN, Bobette Mo, MD   ramelteon (ROZEREM) tablet 8 mg, 8 mg, Oral, QHS, ,  A   senna-docusate (Senokot-S) tablet 1 tablet, 1 tablet, Oral, BID, Khatri, Pardeep, MD, 1 tablet at 10/20/22 0936   sodium chloride flush (NS) 0.9 % injection 10-40 mL, 10-40 mL, Intracatheter, Q12H, Bobette Mo, MD, 10 mL at 10/19/22 2057   sodium chloride flush (NS) 0.9 % injection 10-40 mL, 10-40 mL, Intracatheter, PRN, Bobette Mo, MD   valACYclovir Ralph Dowdy) tablet 500 mg, 500 mg, Oral, Daily PRN, Bobette Mo, MD  Allergies: Allergies  Allergen Reactions   Adhesive [Tape] Dermatitis   Statins Itching and Other  (See Comments)    Myalgias, also

## 2022-10-21 ENCOUNTER — Telehealth: Payer: Self-pay | Admitting: Radiation Therapy

## 2022-10-21 ENCOUNTER — Telehealth: Payer: Self-pay

## 2022-10-21 DIAGNOSIS — G9389 Other specified disorders of brain: Secondary | ICD-10-CM | POA: Diagnosis not present

## 2022-10-21 DIAGNOSIS — M86171 Other acute osteomyelitis, right ankle and foot: Secondary | ICD-10-CM

## 2022-10-21 DIAGNOSIS — Z7189 Other specified counseling: Secondary | ICD-10-CM | POA: Diagnosis not present

## 2022-10-21 DIAGNOSIS — R569 Unspecified convulsions: Secondary | ICD-10-CM | POA: Diagnosis not present

## 2022-10-21 LAB — GLUCOSE, CAPILLARY
Glucose-Capillary: 263 mg/dL — ABNORMAL HIGH (ref 70–99)
Glucose-Capillary: 268 mg/dL — ABNORMAL HIGH (ref 70–99)
Glucose-Capillary: 214 mg/dL — ABNORMAL HIGH (ref 70–99)
Glucose-Capillary: 163 mg/dL — ABNORMAL HIGH (ref 70–99)

## 2022-10-21 MED ORDER — ENSURE ENLIVE PO LIQD
237.0000 mL | Freq: Two times a day (BID) | ORAL | Status: DC
  Administered 2022-10-21 – 2022-11-04 (×13): 237 mL via ORAL

## 2022-10-21 MED ORDER — HYDROMORPHONE HCL 1 MG/ML IJ SOLN
0.5000 mg | Freq: Once | INTRAMUSCULAR | Status: AC
  Administered 2022-10-21: 0.5 mg via INTRAVENOUS
  Filled 2022-10-21: qty 0.5

## 2022-10-21 MED ORDER — HYDROMORPHONE HCL 1 MG/ML IJ SOLN
0.5000 mg | INTRAMUSCULAR | Status: AC | PRN
  Administered 2022-10-21 (×2): 0.5 mg via INTRAVENOUS
  Filled 2022-10-21 (×2): qty 0.5

## 2022-10-21 NOTE — Progress Notes (Signed)
PROGRESS NOTE    Carolyn Hooper  ZOX:[id number] DOB: 1960-10-18 DOA: 10/13/2022  PCP: [name], FNP   Brief Narrative: This 62 yrs old female with medical history significant of hidradenitis suppurativa complicated by abscess, anxiety, type 2 diabetes, hyperinsulinemia, hypertension, gestational preeclampsia, auditory neuroma, history of pancreatitis, vertigo, seizures, history of lung cancer,  metastatic to brain who is brought to the emergency department by a relative after having seizures for the past 2 days. She describes her being unable to talk and fast blinking of her eyelids.  Patient also continued to complaining about abdominal pain from pancreatitis. Pertinent  labs include lipase 297, CT head showing progression of left frontal metastasis associated with vasogenic edema since 2 weeks ago.  Mild new mass effect on the left lateral ventricle but no midline shift or other complicating features.  Patient is admitted for further evaluation and started on Decadron as per Dr. Liana Gerold recommendation.  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 Case was discussed with neuro-oncology Dr. Barbaraann Cao. Continue dexamethasone 4 mg IVP every 6 hours. Continue Keppra 1000 mg p.o. twice daily. Seizure precautions /neurochecks. Discussed the case with Dr. Barbaraann Cao,  he recommended the patient can be discharged if she is doing better on Decadron and he will follow-up outpatient.   Abdominal pain secondary to acute pancreatitis: Continue IV fluids. Analgesics as needed. Antiemetics as  needed. Continue Pantoprazole 40 mg p.o. daily. Pancreatic enzymes before meals/snacks.   Pancreatic pseudocyst: Positive for Candida and Streptococcus viridans. Continue fluconazole 800 mg p.o. daily per ID. Continue Levaquin 500 mg p.o. daily.   Type 2 diabetes with hyperglycemia: Last HbA1c was 8.4% on 09/04/2022. Carbohydrate modified diet. CBG monitoring with RI SS. Continue Semglee 10 units daily.   GAD (generalized anxiety disorder) Continue Klonopin 0.5 mg every 12 hours as needed.   GERD (gastroesophageal reflux disease) Continue pantoprazole 40 mg daily. Antiacid, H2 blocker as needed.   Hypokalemia Replaced.  Resolved.  Suicidal thoughts: Patient has told staff that she has thoughts about hurting herself.   Environmental education officer ordered.  Psychiatry is consulted, not a suicidal risk Safety sitter discontinued.   DVT prophylaxis:SCDs Code Status: DNR Family Communication: No family at bed side. Disposition Plan:   Status is: Inpatient Remains inpatient appropriate because: Admitted for seizures likely in the setting of brain metastasis.  Neuro-oncology was consulted,  recommended dexamethasone and Keppra.  Patient's mother called that she wants this patient to be discharged to hospice or palliative care facility.They cannot take care of her self at home,   Palliative care will have family meeting a conversation with mother today.    Consultants:  Neuro oncologist Psychiatry Palliative care.  Procedures:  Antimicrobials: Anti-infectives (From admission, onward)    Start     Dose/Rate Route Frequency Ordered Stop   10/13/22 1900  levofloxacin (LEVAQUIN) tablet 500 mg        500 mg Oral Daily 10/13/22 1812     10/13/22 1600  fluconazole (DIFLUCAN) tablet 800 mg        800 mg Oral Daily 10/13/22 1009 10/23/22 2359   10/13/22  1005  valACYclovir (VALTREX) tablet 500 mg        500 mg Oral Daily PRN 10/13/22 1009        Subjective: Patient was seen and  examined at bedside. Overnight events noted. Patient reports doing better . she denies any thoughts about hurting herself. She seemed so depressed and anxious.  Safety sitter discontinued.   Objective: Vitals:   10/20/22 0828 10/20/22 1615 10/20/22 2056 10/21/22 0641  BP: (!) 130/95 (!) 158/70 (!) 147/63 (!) 168/76  Pulse: 84 68 (!) 58 (!) 51  Resp: 18 18 14 15   Temp: 98.2 F (36.8 C) 97.9 F (36.6 C) 97.6 F (36.4 C) 97.8 F (36.6 C)  TempSrc: Oral Oral Oral Oral  SpO2: 92% 100% 100% 99%  Weight:      Height:        Intake/Output Summary (Last 24 hours) at 10/21/2022 1133 Last data filed at 10/21/2022 1610 Gross per 24 hour  Intake 5853.83 ml  Output 3650 ml  Net 2203.83 ml   Filed Weights   10/13/22 0429  Weight: 54.4 kg    Examination:  General exam: Appears deconditioned, comfortable, not in any acute distress. Respiratory system: CTA bilaterally. Respiratory effort normal.  RR 15 Cardiovascular system: S1 & S2 heard, RRR. No JVD, murmurs, rubs, gallops or clicks. Gastrointestinal system: Abdomen is non distended, soft and non tender. Normal bowel sounds heard. Central nervous system: Alert and oriented x 2. No focal neurological deficits. Extremities: No edema, no cyanosis, no clubbing Skin: No rashes, lesions or ulcers Psychiatry:  Mood & affect appropriate.     Data Reviewed: I have personally reviewed following labs and imaging studies  CBC: Recent Labs  Lab 10/15/22 0443 10/18/22 0329 10/19/22 0453 10/20/22 0226  WBC 7.2 8.3 8.3 8.5  HGB 9.8* 10.5* 10.4* 10.9*  HCT 32.6* 34.8* 34.3* 35.2*  MCV 100.9* 99.7 100.3* 99.2  PLT 152 146* 134* 121*   Basic Metabolic Panel: Recent Labs  Lab 10/15/22 0443 10/18/22 0329 10/20/22 0226  NA 139 138 138  K 4.4 3.7 3.5  CL 102 99 96*  CO2 24 30 32  GLUCOSE 275* 270* 267*  BUN 13 14 15   CREATININE 0.86 0.74 0.68  CALCIUM 8.6* 8.8* 9.1  MG 1.7 1.8 1.8  PHOS 3.7 3.5 3.7   GFR: Estimated Creatinine  Clearance: 62.6 mL/min (by C-G formula based on SCr of 0.68 mg/dL). Liver Function Tests: Recent Labs  Lab 10/15/22 0443  AST 23  ALT 17  ALKPHOS 66  BILITOT 0.8  PROT 5.1*  ALBUMIN 2.2*   No results for input(s): "LIPASE", "AMYLASE" in the last 168 hours.  No results for input(s): "AMMONIA" in the last 168 hours. Coagulation Profile: No results for input(s): "INR", "PROTIME" in the last 168 hours. Cardiac Enzymes: No results for input(s): "CKTOTAL", "CKMB", "CKMBINDEX", "TROPONINI" in the last 168 hours. BNP (last 3 results) No results for input(s): "PROBNP" in the last 8760 hours. HbA1C: No results for input(s): "HGBA1C" in the last 72 hours. CBG: Recent Labs  Lab 10/20/22 0834 10/20/22 1127 10/20/22 1639 10/20/22 2057 10/21/22 0727  GLUCAP 288* 364* 213* 169* 263*   Lipid Profile: No results for input(s): "CHOL", "HDL", "LDLCALC", "TRIG", "CHOLHDL", "LDLDIRECT" in the last 72 hours. Thyroid Function Tests: No results for input(s): "TSH", "T4TOTAL", "FREET4", "T3FREE", "THYROIDAB" in the last 72 hours. Anemia Panel: No results for input(s): "VITAMINB12", "FOLATE", "FERRITIN", "TIBC", "IRON", "RETICCTPCT" in the last 72 hours. Sepsis Labs: No results for input(s): "PROCALCITON", "LATICACIDVEN"  in the last 168 hours.  No results found for this or any previous visit (from the past 240 hour(s)).   Radiology Studies: No results found.  Scheduled Meds:  antiseptic oral rinse  15 mL Mouth Rinse BID   Chlorhexidine Gluconate Cloth  6 each Topical Daily   dexamethasone (DECADRON) injection  4 mg Intravenous Q6H   docusate sodium  100 mg Oral BID   fluconazole  800 mg Oral Daily   folic acid  1 mg Oral Daily   insulin aspart  0-15 Units Subcutaneous TID WC   insulin aspart  3 Units Subcutaneous TID WC   insulin glargine-yfgn  10 Units Subcutaneous Daily   levETIRAcetam  1,000 mg Oral BID   levofloxacin  500 mg Oral Daily   lipase/protease/amylase  72,000 Units Oral  TID WC   pantoprazole  40 mg Oral Daily   ramelteon  8 mg Oral QHS   senna-docusate  1 tablet Oral BID   sodium chloride flush  10-40 mL Intracatheter Q12H   Continuous Infusions:  lactated ringers 50 mL/hr at 10/21/22 0629     LOS: 7 days    Time spent: 35 mins.    Willeen Niece, MD [facility]   If 7PM-7AM, please contact night-coverage

## 2022-10-21 NOTE — Telephone Encounter (Signed)
This nurse spoke with patient twice today.  Patient  stated that she was calling because she does not understand what is going on. No body will tell her what is going on with her cancer.  This nurse asked patient what exactly is she looking to find out.  Patient states has there been any changes in her numbers.  This nurse has explained to patient multiple times that the hospital staff are the ones that is in charge of her care and can answer all of her questions.  Patient stated to the nurse "I understand that however, it  is like playing musical doctors and it is never the same person she is speaking with and she is not sure if she can trust them".  This nurse explained to patient again they can be trusted and will give her the most current and up to date information.  Patient stated thank you and that she understood.  No further questions or concerns noted at this time.

## 2022-10-21 NOTE — Plan of Care (Signed)
  Problem: Coping: Goal: Ability to adjust to condition or change in health will improve Outcome: Progressing   

## 2022-10-21 NOTE — TOC Progression Note (Addendum)
Transition of Care [facility]) - Progression Note    Patient Details  Name: Carolyn Hooper MRN: [id number] Date of Birth: 05-27-1960  Transition of [facility]) CM/SW Contact  [name], RN Phone Number: 10/21/2022, 10:57 AM  Clinical Narrative:  Teams chat received from [name], NP this morning "pt's mother called requesting placement into hospice or palliative care facility after discharge for this patient if possible. She is asking what her insurance will approve since they are no longer able to provide full care for her at home or continue paying for this service. She has needed complete care per her mom's message, since December" .  -10:58am NCM outreached to pt's son. [name], onfirmed he is pt's HCPOA and POA otherwise, informed attending may be reaching out to him to discuss dc planning options for pt, voiced understanding.   -11:00am Teams chat received from [name], NP for Palliative Medicine Team "  I'm the Palliative NP and I can discuss those with her- yes it does look like she has said that son is HCPOA and she has notorized documents, but they aren't on her chart- I'll request a copy- however, looks as though patient had capacity when we first saw her. I'll let you all know my findings after I see her" . TOC will continue to follow.   -3:33pm Per [name], NP, pt interested in short term rehab at [facility]. PT had planned session today, pt declined. PT will continue to follow. TOC will follow for updated recommendations.      Expected Discharge Plan and Services                                               Social Determinants of Health (SDOH) Interventions SDOH Screenings   Food Insecurity: No Food Insecurity (10/14/2022)  Housing: Low Risk  (10/14/2022)  Transportation Needs: No Transportation Needs (10/14/2022)  Utilities: Not At Risk (10/14/2022)  Depression (PHQ2-9): Medium Risk (07/12/2022)  Tobacco Use: Medium Risk (10/14/2022)    Readmission  Risk Interventions    10/08/2022    4:55 PM 09/24/2022    3:55 PM 09/07/2022    2:44 PM  Readmission Risk Prevention Plan  Transportation Screening Complete Complete Complete  Medication Review Oceanographer) Complete Complete Complete  PCP or Specialist appointment within 3-5 days of discharge Complete Complete Complete  HRI or Home Care Consult Complete Complete Complete  SW Recovery Care/Counseling Consult Complete Complete Complete  Palliative Care Screening Not Applicable Not Applicable Not Applicable  Skilled Nursing Facility Not Applicable Not Applicable Not Applicable

## 2022-10-21 NOTE — Progress Notes (Signed)
PT Cancellation Note  Patient Details Name: Carolyn Hooper MRN: [id number] DOB: Jan 03, 1961   Cancelled Treatment:    Reason Eval/Treat Not Completed:  Attempted PT tx session-pt declined to participate on today. Will check back another day.    Faye Ramsay, PT Acute Rehabilitation  Office: [phone]

## 2022-10-21 NOTE — Consult Note (Addendum)
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  ID was engaged patient discharged on ceftriaxone and micafungin, awaited clinical progress sensitivities.  On 7/23, micafungin was transition to full high-dose fluconazole(glabrata sens fluc SDD). initially plan was about 4 weeks from 7/11 with a EOT 8/6. - Admission 7/20-23 for acute pancreatitis.  CT abdomen pelvis on 7/20 showed pseudocyst with diminished volume compared to previous exam, new small fluid collection between peripancreatic body and stomach measuring 2.3X 1.9 cm favor small pseudocyst.  Mild soft tissue stranding about the pancreatic parenchyma favoring acute pancreatitis.  GI was engaged, no plans for endoscopic intervention at that time.  EGD planned on 8/22. Recommendations:  -CT abdomen pelvis with contrast to assess and dates for infected pseudocyst #Admission for seizure secondary to likely pain meds - Malignant neoplasm of lung, mets to brain - Primary communicated with neuro-oncology, on Decadron  #Diabetes mellitus - A1c 8.4  #Suicidal thoughts - Psychiatry engaged, one-to-one sitter.  Management per  psychiatry and family. Microbiology:   Antibiotics: Fluconazole and Levaquin   HPI: Carolyn Hooper is a 62 y.o. female with past medical history of hidradenitis suppurativa, type 2 diabetes, non-small cell lung cancer with mets to the brain, focal seizures on Keppra on immunotherapy/chemotherapy with Dr. Arbutus Ped, anxiety, hospitalization pancreatitis necrosis with cyst formation last month on Creon presented with abdominal pain that was worsening x 30 days status post 6, stent placement with cultures growing strep viridans and Candida glabrata, cultures of infected pseudocyst.  ID was engaged and patient transition to Levaquin and micafungin-> fluconazole SDD(7/23) x 4 weeks from EUS on 7/11, EOT 8/7 admitted to [facility] after having seizures x 2 days and inability to walk, blinking fast.CT head showed left frontal met metastasis.  Dr. Barbaraann Cao recommended Decadron neuro-oncology per primary.  SPECT seizure secondary to brain mets   Review of Systems: Review of Systems  All other systems reviewed and are negative.   Past Medical History:  Diagnosis Date   Abscess    hydroadenitis superativa   Anxiety    DM (diabetes mellitus) (HCC)    Hyperinsulinemia    Hypertension 1983   gestational - pre-eclampsia   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[facility])    Neuroma    audiotry neuroma   Pancreatitis    Seizure (HCC)    Vertigo     Social History   Tobacco Use   Smoking status: Former    Current packs/day: 0.00    Types: Cigarettes    Quit date: 02/11/2022    Years since quitting: 0.6   Smokeless tobacco: Never  Vaping Use   Vaping status: Never Used  Substance Use Topics   Alcohol use: Yes    Comment: very rare   Drug use: No    Family History  Problem Relation Age of Onset   COPD Mother    Heart disease Father    Diabetes Father    Kidney disease Father    Arthritis Father        RA   Liver disease Father    Scheduled Meds:  antiseptic oral rinse  15 mL Mouth Rinse BID    Chlorhexidine Gluconate Cloth  6 each Topical Daily   dexamethasone (DECADRON) injection  4 mg Intravenous Q6H   docusate sodium  100 mg Oral BID   feeding supplement  237 mL Oral BID BM   fluconazole  800 mg Oral Daily   folic acid  1 mg Oral Daily    HYDROmorphone (DILAUDID) injection  0.5 mg Intravenous Once   insulin aspart  0-15 Units Subcutaneous TID WC   insulin aspart  3 Units Subcutaneous TID WC   insulin glargine-yfgn  10 Units Subcutaneous Daily   levETIRAcetam  1,000 mg Oral BID   levofloxacin  500 mg Oral Daily   lipase/protease/amylase  72,000 Units Oral TID WC   pantoprazole  40 mg Oral Daily   ramelteon  8 mg Oral QHS   senna-docusate  1 tablet Oral BID   sodium chloride flush  10-40 mL Intracatheter Q12H   Continuous Infusions:  lactated ringers 50 mL/hr at 10/21/22 0629   PRN Meds:.acetaminophen **OR** acetaminophen, clonazePAM, lipase/protease/amylase, LORazepam, ondansetron **OR** ondansetron (ZOFRAN) IV, oxyCODONE, prochlorperazine, sodium chloride flush, valACYclovir Allergies  Allergen Reactions   Adhesive [Tape] Dermatitis   Statins Itching and Other (See Comments)    Myalgias, also    OBJECTIVE: Blood pressure (!) 155/89, pulse 76, temperature 97.8 F (36.6 C), temperature source Oral, resp. rate 20, height 5\' 8"  (1.727 m), weight 54.4 kg, SpO2 98%.  Physical Exam Constitutional:      Appearance: Normal appearance.  HENT:     Head: Normocephalic and atraumatic.     Right Ear: Tympanic membrane normal.     Left Ear: Tympanic membrane normal.     Nose: Nose normal.     Mouth/Throat:     Mouth: Mucous membranes are moist.  Eyes:     Extraocular Movements: Extraocular movements intact.     Conjunctiva/sclera: Conjunctivae normal.     Pupils: Pupils are equal, round, and reactive to light.  Cardiovascular:     Rate and Rhythm: Normal rate and regular rhythm.     Heart sounds: No murmur heard.    No friction rub. No gallop.  Pulmonary:      Effort: Pulmonary effort is normal.     Breath sounds: Normal breath sounds.  Abdominal:     General: Abdomen is flat.     Palpations: Abdomen is soft.  Musculoskeletal:        General: Normal range of motion.  Skin:    General: Skin is warm and dry.  Neurological:     General: No focal deficit present.     Mental Status: She is alert and oriented to person, place, and time.  Psychiatric:        Mood and Affect: Mood normal.     Lab Results Lab Results  Component Value Date   WBC 8.5 10/20/2022   HGB 10.9 (L) 10/20/2022   HCT 35.2 (L) 10/20/2022   MCV 99.2 10/20/2022   PLT 121 (L) 10/20/2022    Lab Results  Component  Value Date   CREATININE 0.68 10/20/2022   BUN 15 10/20/2022   NA 138 10/20/2022   K 3.5 10/20/2022   CL 96 (L) 10/20/2022   CO2 32 10/20/2022    Lab Results  Component Value Date   ALT 17 10/15/2022   AST 23 10/15/2022   ALKPHOS 66 10/15/2022   BILITOT 0.8 10/15/2022       Danelle Earthly, MD [facility] 10/21/2022, 3:38 PM   I have personally spent 82 minutes involved in face-to-face and non-face-to-face activities for this patient on the day of the visit. Professional time spent includes the following activities: Preparing to see the patient (review of tests), Obtaining and/or reviewing separately obtained history (admission/discharge record), Performing a medically appropriate examination and/or evaluation , Ordering medications/tests/procedures, referring and communicating with other health care professionals, Documenting clinical information in the EMR, Independently interpreting results (not separately reported), Communicating results to the patient/family/caregiver, Counseling and educating the patient/family/caregiver and Care coordination (not separately reported).

## 2022-10-21 NOTE — Telephone Encounter (Signed)
This nurse spoke with this patient today, who called and wants to know if she is prepared for discharge.  Patient states she does not know what services will be available to her once she is discharged.  Patient continues to state that no one will tell her what is going on.  This nurse advised patient again that the hospital staff will come and discuss her discharge with her.  Advised that she ask all the questions that she has at that time.  Patient states that if her having questions is going to postpone her discharge she will not ask.  This nurse assured her that having questions will not postpone her discharge.  Also encouraged patient to get a notebook or ask for a notepad so she can write down her questions and once they give her the answer she can write them down, and be able to refer back to them when she forgets.  Patient states that she will see if she can get her notebook returned to her.  No further questions or concerns noted at this time.

## 2022-10-21 NOTE — Telephone Encounter (Signed)
I received a voicemail from the pt's mother requesting placement into hospice or palliative care facility after discharge for this patient if possible. She is asking what her insurance will approve since they are no longer able to provide full care for her at home or continue paying for this service. She has needed complete care, per her mom's message, since December.   This message was sent to the [facility] palliative care team and [facility] palliative [facility] team.   Barbara Cower, NP and Mayra Reel, NP from palliative care are addressing this request for the patient's mother.    [name] R.T.(R)(T) Radiation Special Procedures Navigator

## 2022-10-21 NOTE — Progress Notes (Signed)
Daily Progress Note   Patient Name: Carolyn Hooper       Date: 10/21/2022 DOB: 1960-12-03  Age: 62 y.o. MRN#: [id number] Attending Physician: Carolyn Niece, MD Primary Care Physician: Carolyn Spencer, FNP Admit Date: 10/13/2022  Reason for Consultation/Follow-up: Establishing goals of care  Patient Profile/HPI:  63 y.o. female  with past medical history of hidradenitis suppurativa complicated by abscess, anxiety, type 2 diabetes, hyperinsulinemia, hypertension, gestational preeclampsia, auditory neuroma, history of pancreatitis, vertigo, seizures, and history of lung cancer metastatic to brain admitted on 10/13/2022 with seizures. Also with ongoing abdominal pain d/t pancreatitis. CT head revealed progression of left frontal metastasis and edema. PMT consulted to discuss GOC.    Subjective: Chart reviewed including labs, progress notes, imaging from this and previous encounters.  Patient's mother called Oncology navigator and requested hospice services.  I met with patient for further discussion of goals of care.  She is unclear about her options for future treatment. She has been attempting to contact Dr. Barbaraann Hooper for his input. She worries that she will die before she hears from him.  I answered her questions about hospice services at home and in a facility.  She has concerns due to her husband died at home from penile cancer in a great amount of pain d/t hospice RN could not get to their home fast enough.  She also notes that her father died in a hospice facility and her mother was dissatisfied with that outcome. She asked about when the right time for hospice referral? We discussed hospice is appropriate when patient has a terminal illness and either there are no further treatment options, or  patient does not want to continue with further treatment options.  Carolyn Hooper expresses she is unclear if there are further treatment options for her cancer or not.  I attempted to discuss with patient's Oncology team. Dr. Barbaraann Hooper recommends continue on steroids and Keppra and repeat MRI in one month- this plan does treat her symptoms- but is not likely to prevent progression of her brain metastasis and further decline. Dr. Arbutus Hooper notes that she is stable systemically on her maintenance chemotherapy, however, her brain metastasis is her primary concern.  [name] wishes to continue with any offered treatments for her cancer. Plan made for her to follow up with Dr. Barbaraann Hooper at scheduled appointment next  week, and with Carolyn Hawthorn, NP with Palliative at [facility] on 8/21 for further discussion about her options.  I also spoke with patient's mother per request of Carolyn Hooper, Navigator at [facility]. [place] expressed concerns for patient being adequately cared for at home in her current condition. I discussed patient's mother concerns with Carolyn Hooper and she is open to referral to rehab. I explained to Carolyn Hooper she would need PT eval before she could be referred to SNF. Carolyn Hooper noted she had had many care providers visit her today and she declined PT because she just wanted to rest. I discussed with her that she needs to participate in PT eval and she agreed.   Review of Systems  Constitutional:  Positive for malaise/fatigue.     Physical Exam Vitals and nursing note reviewed.  Neurological:     Mental Status: She is alert and oriented to person, place, and time.     Comments: Slow processing, HOH in L ear  Psychiatric:        Mood and Affect: Mood normal.        Behavior: Behavior normal.        Thought Content: Thought content normal.        Judgment: Judgment normal.             Vital Signs: BP (!) 168/76 (BP Location: Left Arm)   Pulse (!) 51   Temp 97.8 F (36.6 C) (Oral)   Resp 15   Ht 5\' 8"  (1.727 m)   Wt 54.4 kg    SpO2 99%   BMI 18.25 kg/m  SpO2: SpO2: 99 % O2 Device: O2 Device: Nasal Cannula O2 Flow Rate: O2 Flow Rate (L/min): 4 L/min  Intake/output summary:  Intake/Output Summary (Last 24 hours) at 10/21/2022 1237 Last data filed at 10/21/2022 1000 Gross per 24 hour  Intake 6213.83 ml  Output 3650 ml  Net 2563.83 ml   LBM: Last BM Date : 10/18/22 Baseline Weight: Weight: 54.4 kg Most recent weight: Weight: 54.4 kg       Palliative Assessment/Data: PPS: 50%      Patient Active Problem List   Diagnosis Date Noted   Seizures (HCC) 10/13/2022   Pancreatic pseudocyst 10/13/2022   Hypokalemia 10/13/2022   Intractable pain 09/22/2022   Abdominal pain 09/21/2022   GERD (gastroesophageal reflux disease) 09/21/2022   Elevated blood pressure reading 09/21/2022   Obesity (BMI 30-39.9) 09/10/2022   Acute pancreatitis 09/04/2022   Pyuria 09/04/2022   Cholecystitis, acute 07/27/2022   Fever, unknown origin 07/27/2022   Lower abdominal pain 07/27/2022   Diarrhea 07/27/2022   Fever 07/26/2022   Cholelithiasis 07/16/2022   Primary insomnia 07/12/2022   Swelling 06/20/2022   Encounter for antineoplastic chemotherapy 06/05/2022   Encounter for antineoplastic immunotherapy 06/05/2022   Port-A-Cath in place 04/24/2022   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) 03/07/2022   Focal seizure (HCC) 03/07/2022   Non-small cell lung cancer (HCC) 02/25/2022   Goals of care, counseling/discussion 02/25/2022   Lung mass 02/13/2022   Subarachnoid hemorrhage (HCC) 02/12/2022   Aphasia 02/12/2022   Brain mass 02/12/2022   Leukocytosis 02/12/2022   Mass of lower lobe of left lung 02/12/2022   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   Refusal of statin medication by patient 07/10/2020   Myalgia due to statin 07/10/2020   Controlled substance agreement signed 05/28/2018   Benzodiazepine dependence (HCC) 05/28/2018   Hyperlipidemia associated with type 2 diabetes mellitus (HCC)  04/11/2016   Morbid obesity (HCC) 04/11/2016  Former smoker 04/11/2016   Hidradenitis suppurativa 04/11/2016   GAD (generalized anxiety disorder) 08/19/2013   Type 2 diabetes mellitus (HCC) 08/19/2013   Hyperinsulinemia 08/19/2013    Palliative Care Assessment & Plan    Assessment/Recommendations/Plan  Patient with progression of brain metastasis- she needs to hear from Oncology:  1. Are there life prolonging Oncology treatments available to her?  2. Given that at this point the brain metastasis are the primary issue related to her morbidity and mortality- what would be the benefit of continuing systemic chemotherapy?  Janyiah is open to rehab- recommend PT eval for placement options- TOC to assist with patient's disposition- not a candidate for hospice facility at this time If there are not further treatment options for patient's cancer, then she wishes to have hospice at home- steroids and Keppra are symptomatic treatments that can be continued on hospice Plan for followup with [facility] Palliative   Code Status: DNR  Prognosis:  Unable to determine  Discharge Planning: To Be Determined  Care plan was discussed with multiple care providers, patient and her mother.   Thank you for allowing the Palliative Medicine Team to assist in the care of this patient.  Total time:  120 minutes  Greater than 50%  of this time was spent counseling and coordinating care related to the above assessment and plan.  [name], AGNP-C Palliative Medicine   Please contact Palliative Medicine Team phone at [phone] for questions and concerns.

## 2022-10-21 NOTE — Consult Note (Signed)
[facility] Psychiatry Consult Evaluation  Service Date: October 21, 2022 LOS:  LOS: 7 days    Primary Psychiatric Diagnoses  Adjustment d/o with anxiety and depressed mood 2.  Insomnia 3.  MDD d/t medical condition  Assessment  Carolyn Hooper is a 62 y.o. female admitted medically for 10/13/2022  4:05 AM for seizures. She carries the psychiatric diagnoses of no formal dx and has a past medical history of  lung cancer metastatic to brain. Psychiatry was consulted for suicidal statements by Dr. Idelle Leech; exact statement not documented but was to nursing tech 1-2 days ago.   Patient's current presentation of poor sleep, decreased energy, decreased appetite and occasional low mood is most consistent with a diagnosis of adjustment disorder (all symptoms seem to be relatively new and tied to worsening in symptoms of metastatic cancer).  On examination some of her detailed orientation, may be perceived as paranoia (spelling of name, department, reason for visit with every provider entering the room, has a list that she is handwriting) as previously noted this may be due to increase in steroids versus metastatic cancer.  At any rate she continues to decline current and or recent suicidal ideations, suicidal thoughts, suicidal intentions.  She does admit to vocalizing statements while frustrated, that indicated some suicidal intentions.  She does present with a lot of anxiety, uncertainty, and fear that likely can be addressed through the chaplain as she continues to have strong faith, believes in God, and is willing to fight for treatment.  "I am trying to stay alive as long as I can.  I may make some offhanded sarcastic remarks however I am not ready to die whether it is my time or not."  She continues to endorse having a fair appetite and fair sleep.  She has been compliant to current treatment.  She denies any side effects and/or adverse reactions at this time.  She further denies any acute psychosis,  paranoia, hallucination, and or delusions.  She does not present with these symptoms on evaluation.  She answers all questions with linear conversation, yet delayed speech.  She does apologize ahead of time for her delayed speech, and is hypervigilant and cautious about her statements and speech.  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  ## Behavioral / Environmental:  --  Utilize compassion and acknowledge the patient's experiences while setting clear and realistic expectations for care.    ## Safety and Observation Level:  - Based on my clinical evaluation, I estimate the patient to be at low risk of self harm in the current setting - At this time, we recommend a routine level of observation. This decision is based on my review of the chart including patient's history and current presentation, interview of the  patient, mental status examination, and consideration of suicide risk including evaluating suicidal ideation, plan, intent, suicidal or self-harm behaviors, risk factors, and protective factors. This judgment is based on our ability to directly address suicide risk, implement suicide prevention strategies and develop a safety plan while the patient is  in the clinical setting. Please contact our team if there is a concern that risk level has changed.  Suicide risk assessment Patient's risk factors remain unchanged, and continue to be a low suicide risk for acute suicide; and chronically low suicide risk.  Patient has following modifiable risk factors for suicide: insomnia, adjustment d/o which we are addressing by prescribing Rozerem.   Patient has following non-modifiable or demographic risk factors for suicide: none  Patient has the following protective factors against suicide: Supportive family, Cultural, spiritual, or religious beliefs that discourage suicide, Frustration tolerance, no history of suicide attempts, and no history of NSSIB   Thank you for this consult request. Recommendations have been communicated to the primary team.  Psychiatry consult service will sign off at this time.  Maryagnes Amos, FNP  Psychiatric and Social History   Relevant Aspects of Hospital Course:  Admitted on 10/13/2022 for seizures. They have been put on steroids.   Patient Report: She continues to have some memory and confusion as evidenced by her ability to write down name and department for every person that enters the room.  She reports improvement of sleep, likely secondary to medication.  She continues to be future oriented and motivated for all treatment options for her current metastatic cancer.  She further reports that she loves life, and is not ready to die soon.  She is aware of some coping skills that she can use to help during this time of uncertainty and fear.  She is further encouraged to resort back to her spiritual beliefs where she gets her strength.  She denies any suicidal ideations, homicidal ideations, and or auditory or visual hallucinations.  Psych ROS:  No SI, HI, AH/VH. Sleep fair.  -Recent parasuicidal statements while frustrated.  No history of suicide attempt  Collateral information:  none  Psychiatric History:   Information collected from patient  Prev Dx/Sx: Was diagnosed with some sort of anxiety disorder in her 72s, never medicated Current Psych Provider: None Home Meds (current): klonopin from PCP  Previous Med Trials: no prior med trial to her knowledge Therapy: none  Prior Psych Hospitalization: no  Prior Self Harm: no Prior Violence: no  Family Psych History: niece and nephew w/ depression Family Hx suicide: no  Social History:  Occupational Hx: postal servicd Legal Hx: non Living Situation: w/ mom since dx Spiritual Hx: quite spiritual, strong relationship with God Access to weapons: no  Substance History Tobacco use: quit when diagnosed.  Alcohol use: no Drug use: no   Exam Findings   Psychiatric Specialty Exam:  Presentation  General Appearance: Appropriate for Environment; Casual  Eye Contact:Good  Speech:Clear and Coherent (delayed(yet appropriate speed))  Speech Volume:Normal  Handedness:Right   Mood and Affect  Mood:Euthymic  Affect:Appropriate; Congruent   Thought Process  Thought Processes:Linear; Coherent  Descriptions of Associations:Intact  Orientation:Full (Time, Place and Person)  Thought Content:Logical  Hallucinations:Hallucinations: None  Ideas of Reference:None  Suicidal Thoughts:Suicidal Thoughts: No  Homicidal Thoughts:Homicidal Thoughts: No   Sensorium  Memory:Immediate Fair; Recent Fair; Remote Fair  Judgment:Fair  Insight:Fair   Executive Functions  Concentration:Fair  Attention Span:Fair  Recall:Fair  Fund of Knowledge:Fair  Language:Fair   Psychomotor Activity  Psychomotor Activity:Psychomotor  Activity: Normal   Assets  Assets:Communication Skills; Desire for Improvement; Social Support   Sleep  Sleep:\Improved   Physical Exam: Vital signs:  Temp:  [97.6 F (36.4 C)-97.9 F (36.6 C)] 97.8 F (36.6 C) (08/05 0641) Pulse Rate:  [51-76] 76 (08/05 1252) Resp:  [14-20] 20 (08/05 1252) BP:  (147-168)/(63-89) 155/89 (08/05 1252) SpO2:  [98 %-100 %] 98 % (08/05 1252) Physical Exam Vitals and nursing note reviewed.  Eyes:     Conjunctiva/sclera: Conjunctivae normal.  Pulmonary:     Effort: Pulmonary effort is normal.  Neurological:     General: No focal deficit present.     Mental Status: She is alert and oriented to person, place, and time. Mental status is at baseline.  Psychiatric:        Mood and Affect: Mood normal.        Behavior: Behavior normal.        Thought Content: Thought content normal.        Judgment: Judgment normal.     Blood pressure (!) 155/89, pulse 76, temperature 97.8 F (36.6 C), temperature source Oral, resp. rate 20, height 5\' 8"  (1.727 m), weight 54.4 kg, SpO2 98%. Body mass index is 18.25 kg/m.   Other History   These have been pulled in through the EMR, reviewed, and updated if appropriate.   Family History:  The patient's family history includes Arthritis in her father; COPD in her mother; Diabetes in her father; Heart disease in her father; Kidney disease in her father; Liver disease in her father.  Medical History: Past Medical History:  Diagnosis Date   Abscess    hydroadenitis superativa   Anxiety    DM (diabetes mellitus) (HCC)    Hyperinsulinemia    Hypertension 1983   gestational - pre-eclampsia   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[facility])    Neuroma    audiotry neuroma   Pancreatitis    Seizure (HCC)    Vertigo     Surgical History: Past Surgical History:  Procedure Laterality Date   ABDOMINAL HYSTERECTOMY  2006   remaining left ovary   BALLOON DILATION N/A 09/26/2022   Procedure: BALLOON DILATION;  Surgeon: Lemar Lofty., MD;  Location: [facility];  Service: Gastroenterology;  Laterality: N/A;   BIOPSY  09/26/2022   Procedure: BIOPSY;  Surgeon: Meridee Score Netty Starring., MD;  Location: [facility];  Service: Gastroenterology;;   BRONCHIAL BIOPSY  02/19/2022   Procedure: BRONCHIAL BIOPSIES;  Surgeon: Josephine Igo, DO;  Location: [facility];  Service: Pulmonary;;   BRONCHIAL BRUSHINGS  02/19/2022   Procedure: BRONCHIAL BRUSHINGS;  Surgeon: Josephine Igo, DO;  Location: [facility];  Service: Pulmonary;;   BRONCHIAL NEEDLE ASPIRATION BIOPSY  02/19/2022   Procedure: BRONCHIAL NEEDLE ASPIRATION BIOPSIES;  Surgeon: Josephine Igo, DO;  Location: [facility];  Service: Pulmonary;;   ESOPHAGOGASTRODUODENOSCOPY (EGD) WITH PROPOFOL N/A 09/26/2022   Procedure: ESOPHAGOGASTRODUODENOSCOPY (EGD) WITH PROPOFOL;  Surgeon: Lemar Lofty., MD;  Location: [facility];  Service: Gastroenterology;  Laterality: N/A;   IR IMAGING GUIDED PORT INSERTION  03/29/2022   PANCREATIC STENT PLACEMENT  09/26/2022   Procedure: PANCREATIC STENT PLACEMENT;  Surgeon: Meridee Score Netty Starring., MD;  Location: [facility];  Service: Gastroenterology;;  axios   SHOULDER SURGERY Right 1960s   UPPER ESOPHAGEAL ENDOSCOPIC ULTRASOUND (EUS) N/A 09/26/2022   Procedure: UPPER ESOPHAGEAL ENDOSCOPIC ULTRASOUND (EUS);  Surgeon: Lemar Lofty., MD;  Location: [facility];  Service: Gastroenterology;  Laterality: N/A;  For cyst drainage probably will need  stent    Medications:   Current Facility-Administered Medications:    acetaminophen (TYLENOL) tablet 650 mg, 650 mg, Oral, Q6H PRN, 650 mg at 10/20/22 2206 **OR** acetaminophen (TYLENOL) suppository 650 mg, 650 mg, Rectal, Q6H PRN, Bobette Mo, MD   antiseptic oral rinse (BIOTENE) solution 15 mL, 15 mL, Mouth Rinse, BID, Shaffer, Luvenia Redden, NP, 15 mL at 10/21/22 0810   Chlorhexidine Gluconate Cloth 2 % PADS 6 each, 6 each, Topical, Daily, Bobette Mo, MD, 6 each at 10/21/22 1239   clonazePAM (KLONOPIN) tablet 0.5 mg, 0.5 mg, Oral, BID PRN, Bobette Mo, MD, 0.5 mg at 10/21/22 9562   dexamethasone (DECADRON) injection 4 mg, 4 mg, Intravenous, Q6H, Tilden Fossa, MD, 4 mg at 10/21/22 1239   docusate sodium (COLACE) capsule 100 mg, 100 mg, Oral,  BID, Idelle Leech, Pardeep, MD, 100 mg at 10/21/22 1308   feeding supplement (ENSURE ENLIVE / ENSURE PLUS) liquid 237 mL, 237 mL, Oral, BID BM, Khatri, Pardeep, MD   fluconazole (DIFLUCAN) tablet 800 mg, 800 mg, Oral, Daily, Khatri, Pardeep, MD, 800 mg at 10/21/22 0809   folic acid (FOLVITE) tablet 1 mg, 1 mg, Oral, Daily, Bobette Mo, MD, 1 mg at 10/21/22 6578   HYDROmorphone (DILAUDID) injection 0.5 mg, 0.5 mg, Intravenous, Once, Khatri, Pardeep, MD   insulin aspart (novoLOG) injection 0-15 Units, 0-15 Units, Subcutaneous, TID WC, Bobette Mo, MD, 8 Units at 10/21/22 1239   insulin aspart (novoLOG) injection 3 Units, 3 Units, Subcutaneous, TID WC, Willeen Niece, MD, 3 Units at 10/21/22 1239   insulin glargine-yfgn (SEMGLEE) injection 10 Units, 10 Units, Subcutaneous, Daily, Willeen Niece, MD, 10 Units at 10/21/22 4696   lactated ringers infusion, , Intravenous, Continuous, Luiz Iron, NP, Last Rate: 50 mL/hr at 10/21/22 2952, Infusion Verify at 10/21/22 0629   levETIRAcetam (KEPPRA) tablet 1,000 mg, 1,000 mg, Oral, BID, Bobette Mo, MD, 1,000 mg at 10/21/22 8413   levofloxacin (LEVAQUIN) tablet 500 mg, 500 mg, Oral, Daily, Idelle Leech, Pardeep, MD, 500 mg at 10/21/22 0807   lipase/protease/amylase (CREON) capsule 36,000 Units, 36,000 Units, Oral, PRN, Bobette Mo, MD, 36,000 Units at 10/17/22 1015   lipase/protease/amylase (CREON) capsule 72,000 Units, 72,000 Units, Oral, TID WC, Bobette Mo, MD, 72,000 Units at 10/21/22 1239   LORazepam (ATIVAN) injection 1 mg, 1 mg, Intravenous, Q4H PRN, Bobette Mo, MD   ondansetron [facility]) tablet 4 mg, 4 mg, Oral, Q6H PRN, 4 mg at 10/20/22 1839 **OR** ondansetron (ZOFRAN) injection 4 mg, 4 mg, Intravenous, Q6H PRN, Bobette Mo, MD, 4 mg at 10/21/22 0559   oxyCODONE (Oxy IR/ROXICODONE) immediate release tablet 5 mg, 5 mg, Oral, Q8H PRN, Bobette Mo, MD, 5 mg at 10/21/22 1239   pantoprazole  (PROTONIX) EC tablet 40 mg, 40 mg, Oral, Daily, Bobette Mo, MD, 40 mg at 10/21/22 2440   prochlorperazine (COMPAZINE) tablet 5 mg, 5 mg, Oral, Q6H PRN, Bobette Mo, MD   ramelteon (ROZEREM) tablet 8 mg, 8 mg, Oral, QHS, Cinderella, Margaret A, 8 mg at 10/20/22 2206   senna-docusate (Senokot-S) tablet 1 tablet, 1 tablet, Oral, BID, Khatri, Pardeep, MD, 1 tablet at 10/20/22 0936   sodium chloride flush (NS) 0.9 % injection 10-40 mL, 10-40 mL, Intracatheter, Q12H, Bobette Mo, MD, 10 mL at 10/21/22 1027   sodium chloride flush (NS) 0.9 % injection 10-40 mL, 10-40 mL, Intracatheter, PRN, Bobette Mo, MD   valACYclovir (VALTREX) tablet 500 mg, 500 mg, Oral, Daily PRN,  Bobette Mo, MD  Allergies: Allergies  Allergen Reactions   Adhesive [Tape] Dermatitis   Statins Itching and Other (See Comments)    Myalgias, also

## 2022-10-21 NOTE — Progress Notes (Signed)
Chaplain engaged in a follow-up visit with Carolyn Hooper. Mahika wanted to talk about her plans for her future funeral service. Chaplain and Nelsie walked through what feels important for her at this time to relay to family and be within her service. Cristal expressed that she overall wants to have a service full of joy. Chaplain was able to record these significant pieces of service for By[place].   Chaplain offered reflective listening, a compassionate presence, and support.     10/21/22 1100  Spiritual Encounters  Type of Visit Follow up  Care provided to: Patient  Reason for visit Routine spiritual support  Spiritual Framework  Presenting Themes Values and beliefs;Goals in life/care;Meaning/purpose/sources of inspiration;Rituals and practive  Community/Connection Family;Friend(s)

## 2022-10-22 ENCOUNTER — Inpatient Hospital Stay (HOSPITAL_COMMUNITY): Payer: BC Managed Care – PPO

## 2022-10-22 DIAGNOSIS — R569 Unspecified convulsions: Secondary | ICD-10-CM | POA: Diagnosis not present

## 2022-10-22 DIAGNOSIS — Z7189 Other specified counseling: Secondary | ICD-10-CM | POA: Diagnosis not present

## 2022-10-22 DIAGNOSIS — C7931 Secondary malignant neoplasm of brain: Secondary | ICD-10-CM | POA: Diagnosis not present

## 2022-10-22 LAB — GLUCOSE, CAPILLARY
Glucose-Capillary: 273 mg/dL — ABNORMAL HIGH (ref 70–99)
Glucose-Capillary: 261 mg/dL — ABNORMAL HIGH (ref 70–99)
Glucose-Capillary: 146 mg/dL — ABNORMAL HIGH (ref 70–99)
Glucose-Capillary: 194 mg/dL — ABNORMAL HIGH (ref 70–99)

## 2022-10-22 MED ORDER — IOHEXOL 9 MG/ML PO SOLN
ORAL | Status: AC
  Filled 2022-10-22: qty 1000

## 2022-10-22 MED ORDER — IOHEXOL 300 MG/ML  SOLN
100.0000 mL | Freq: Once | INTRAMUSCULAR | Status: AC | PRN
  Administered 2022-10-22: 100 mL via INTRAVENOUS

## 2022-10-22 MED ORDER — HYDROMORPHONE HCL 1 MG/ML IJ SOLN
0.5000 mg | Freq: Once | INTRAMUSCULAR | Status: AC
  Administered 2022-10-22: 0.5 mg via INTRAVENOUS
  Filled 2022-10-22: qty 0.5

## 2022-10-22 NOTE — Progress Notes (Addendum)
Physical Therapy Treatment Patient Details Name: Carolyn Hooper MRN: [id number] DOB: 06/21/1960 Today's Date: 10/22/2022   History of Present Illness Pt is a 62 y/o female presenting to ED on 10/13/2022 due to reports of seizure like activity with fluttering eye lids and difficulty in speech. Pt indicates 2 seizures in 2 days, fall and poor PO intake with N and V.  Pt d/c from hospital following admission 7/20-7/23/2024 in which CT revealing episode of acute pancreatitis with new small fluid collection and found to be hypokalemic. Pt seen by GI and recommendation for conservative measures. Pt provided with IV dilaudid and pain improved.  Pt hospitalized 7/6-7/15/2024 with abdominal pain and hospitalized 6/19-6/27 with epigastric pain. CT abdomen revealed enhancing fluid collection in stomach. S/p cystogastrostomy on 7/11. MRI of brain on 09/29/2022 reveals stable size of 15 x 13 x 14 mm peripherally enhancing metastasis in the posterior left frontal lobe, stable punctate enhancing metastasis in the medial right frontal lobe. Pt PMH includes but is not limited to:  non-small cell lung CA, brain metastasis, focal seizures, DM and pt currently on immunotherapy/chemotherapy.    PT Comments  Secure chat from RN stating pt is requesting PT return to try to mobilize a bit more. Arrived to room to work with pt. Extensive discussion had with pt explaining how we can mobilize (pt concerned about having to urinate frequently), reasoning for OOB activity, potential need for continued rehab before going home, pt's right to choose to or decline participating with therapies, making most of time during sessions. Pt seems to have quite a bit of anxiety. This, along with prolonged conversation (multiple topics), limits the amount of time pt spends actually mobilizing (40 minutes discussion time before pt attempted any activity). Pt stated her goal is to "get home." Encouraged her to have discussion with family and [facility] team  regarding plan after this hospital stay. Patient will benefit from continued inpatient follow up therapy, <3 hours/day.    If plan is discharge home, recommend the following: A little help with walking and/or transfers;A little help with bathing/dressing/bathroom;Assistance with cooking/housework;Assist for transportation;Help with stairs or ramp for entrance   Can travel by private vehicle     No  Equipment Recommendations   (TBD at next venue)    Recommendations for Other Services       Precautions / Restrictions Precautions Precautions: Fall Restrictions Weight Bearing Restrictions: No     Mobility  Bed Mobility Overal bed mobility: Needs Assistance Bed Mobility: Sidelying to Sit, Sit to Sidelying   Sidelying to sit: HOB elevated     Sit to sidelying: HOB elevated General bed mobility comments: Increased time. Pt relied on bedrail. Cues provided. No assist given-contact guard.    Transfers Overall transfer level: Needs assistance Equipment used: Rolling walker (2 wheels) Transfers: Sit to/from Stand Sit to Stand: Min assist, From elevated surface          General transfer comment: Cues for safety, hand placement. Assist to steady pt and stabilize walker.    Ambulation/Gait Ambulation/Gait assistance: Min assist Gait Distance (Feet): 10 Feet Assistive device: Rolling walker (2 wheels) Gait Pattern/deviations: Step-through pattern, Decreased stride length       General Gait Details: Assist to steady pt throughout short distance. Distance limited by LE fatigue, weakness (per patient report).  Cues for safety.   Stairs             Wheelchair Mobility     Tilt Bed    Modified Rankin (Stroke Patients  Only)       Balance Overall balance assessment: Needs assistance, History of Falls         Standing balance support: During functional activity, Reliant on assistive device for balance, Bilateral upper extremity supported Standing  balance-Leahy Scale: Poor                              Cognition Arousal/Alertness: Awake/alert Behavior During Therapy: WFL for tasks assessed/performed Overall Cognitive Status: Impaired/Different from baseline Area of Impairment: Following commands                       Following Commands: Follows multi-step commands consistently       General Comments: some delayed responses,and mild word finding difficulties noted. requires encouragement to participate. extensive discussions required before actual participation        Exercises      General Comments        Pertinent Vitals/Pain Pain Assessment Pain Assessment: Faces Faces Pain Scale: No hurt    Home Living                          Prior Function            PT Goals (current goals can now be found in the care plan section) Progress towards PT goals: Progressing toward goals    Frequency    Min 1X/week      PT Plan Current plan remains appropriate    Co-evaluation              AM-PAC PT "6 Clicks" Mobility   Outcome Measure  Help needed turning from your back to your side while in a flat bed without using bedrails?: A Little Help needed moving from lying on your back to sitting on the side of a flat bed without using bedrails?: A Little Help needed moving to and from a bed to a chair (including a wheelchair)?: A Little Help needed standing up from a chair using your arms (e.g., wheelchair or bedside chair)?: A Little Help needed to walk in hospital room?: A Lot Help needed climbing 3-5 steps with a railing? : Total 6 Click Score: 15    End of Session Equipment Utilized During Treatment: Gait belt Activity Tolerance: Patient limited by fatigue;Patient tolerated treatment well Patient left: in bed;with call bell/phone within reach;with bed alarm set   PT Visit Diagnosis: Unsteadiness on feet (R26.81);Muscle weakness (generalized) (M62.81);Difficulty in walking,  not elsewhere classified (R26.2)     Time: 4268-3419 PT Time Calculation (min) (ACUTE ONLY): 60 min  Charges:    $Gait Training: 23-37 mins $Therapeutic Activity: 23-37 mins PT General Charges $$ ACUTE PT VISIT: 1 Visit                      Faye Ramsay, PT Acute Rehabilitation  Office: [phone]

## 2022-10-22 NOTE — Progress Notes (Addendum)
Pt went into Af[facility] nonsustained. Garner Nash. NP notified. Will continue with plan of care.

## 2022-10-22 NOTE — Progress Notes (Signed)
OT Cancellation Note  Patient Details Name: Carolyn Hooper MRN: [id number] DOB: [birth year]   Cancelled Treatment:    Reason Eval/Treat Not Completed: Patient at procedure or test/ unavailable Patient off the hall at CT. OT to continue to follow. Rosalio Loud, MS Acute Rehabilitation Department Office# [phone]  10/22/2022, 2:17 PM

## 2022-10-22 NOTE — Progress Notes (Signed)
PROGRESS NOTE    Carolyn Hooper  JJO:[id number] DOB: [birth year] DOA: 10/13/2022  PCP: [name], FNP   Brief Narrative: This 62 yrs old female with medical history significant of hidradenitis suppurativa complicated by abscess, anxiety, type 2 diabetes, hyperinsulinemia, hypertension, gestational preeclampsia, auditory neuroma, history of pancreatitis, vertigo, seizures, history of lung cancer,  metastatic to brain who is brought to the emergency department by a relative after having seizures for the past 2 days. She describes her being unable to talk and fast blinking of her eyelids.  Patient also continued to complaining about abdominal pain from pancreatitis. Pertinent  labs include lipase 297, CT head showing progression of left frontal metastasis associated with vasogenic edema since 2 weeks ago.  Mild new mass effect on the left lateral ventricle but no midline shift or other complicating features.  Patient is admitted for further evaluation and started on Decadron as per Dr. Liana Gerold recommendation.  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 Case was discussed with neuro-oncology Dr. Barbaraann Cao. Continue dexamethasone 4 mg IVP every 6 hours. Continue Keppra 1000 mg p.o. twice daily. Seizure precautions /neurochecks. Discussed the case with Dr. Barbaraann Cao,  he recommended the patient can be discharged if she is doing better on Decadron and he will follow-up outpatient.   Abdominal pain secondary to acute pancreatitis: Continue IV fluids. Analgesics as needed. Antiemetics as  needed. Continue Pantoprazole 40 mg p.o. daily. Pancreatic enzymes before meals/snacks.   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 Continue fluconazole 800 mg p.o. daily per ID. Continue Levaquin 500 mg p.o. daily. ID following recommended to repeat CTA/P.   Type 2 diabetes with hyperglycemia: Last HbA1c was 8.4% on 09/04/2022. Carbohydrate modified diet. CBG monitoring with RI SS. Continue Semglee 10 units daily.   GAD (generalized anxiety disorder) Continue Klonopin 0.5 mg every 12 hours as needed.   GERD (gastroesophageal reflux disease) Continue pantoprazole 40 mg daily. Antiacid, H2 blocker as needed.   Hypokalemia Replaced.  Resolved.  Suicidal thoughts: Patient has told staff that she has thoughts about hurting herself.   Environmental education officer ordered.  Psychiatry is consulted, not a suicidal risk Safety sitter discontinued. Psych signed off, Not suicidal.  Goals of care: Patient's mother called that she wants her daughter to be discharged to hospice or palliative care facility.They cannot take care of her at home,   Palliative care will have family meeting  a conversation with mother today.   DVT prophylaxis:SCDs Code Status: DNR Family Communication: No family at bed side. Disposition Plan:   Status is: Inpatient Remains inpatient appropriate because: Admitted for seizures likely in the setting of brain metastasis.  Neuro-oncology was consulted,  recommended dexamethasone and Keppra.  Patient's mother called that she wants this patient to be discharged to hospice or palliative care facility.They cannot take care of her self at home,   Palliative care will have family meeting a conversation with mother today.    Consultants:  Neuro oncologist Psychiatry Palliative care.  Procedures:  Antimicrobials: Anti-infectives (From admission, onward)    Start     Dose/Rate Route Frequency Ordered  Stop   10/13/22 1900   levofloxacin (LEVAQUIN) tablet 500 mg        500 mg Oral Daily 10/13/22 1812     10/13/22 1600  fluconazole (DIFLUCAN) tablet 800 mg        800 mg Oral Daily 10/13/22 1009 10/23/22 2359   10/13/22 1005  valACYclovir (VALTREX) tablet 500 mg        500 mg Oral Daily PRN 10/13/22 1009        Subjective: Patient was seen and examined at bedside. Overnight events noted. Patient reports doing better.  She denies any thoughts about hurting herself. She seemed so depressed and anxious.  Safety sitter discontinued.   Objective: Vitals:   10/21/22 0641 10/21/22 1252 10/21/22 2007 10/22/22 0658  BP: (!) 168/76 (!) 155/89 115/86 (!) 167/80  Pulse: (!) 51 76 67 (!) 54  Resp: 15 20 16    Temp: 97.8 F (36.6 C)  98.2 F (36.8 C) 97.6 F (36.4 C)  TempSrc: Oral  Oral Oral  SpO2: 99% 98% 100% 95%  Weight:      Height:        Intake/Output Summary (Last 24 hours) at 10/22/2022 1300 Last data filed at 10/22/2022 1222 Gross per 24 hour  Intake 2233.72 ml  Output 1500 ml  Net 733.72 ml   Filed Weights   10/13/22 0429  Weight: 54.4 kg    Examination:  General exam: Appears deconditioned, comfortable, not in any acute distress. Respiratory system: CTA bilaterally. Respiratory effort normal.  RR 14 Cardiovascular system: S1 & S2 heard, RRR. No JVD, murmurs, rubs, gallops or clicks. Gastrointestinal system: Abdomen is non distended, soft and non tender. Normal bowel sounds heard. Central nervous system: Alert and oriented x 2. No focal neurological deficits. Extremities: No edema, no cyanosis, no clubbing Skin: No rashes, lesions or ulcers Psychiatry:  Mood & affect appropriate.     Data Reviewed: I have personally reviewed following labs and imaging studies  CBC: Recent Labs  Lab 10/18/22 0329 10/19/22 0453 10/20/22 0226  WBC 8.3 8.3 8.5  HGB 10.5* 10.4* 10.9*  HCT 34.8* 34.3* 35.2*  MCV 99.7 100.3* 99.2  PLT 146* 134* 121*   Basic Metabolic Panel: Recent Labs  Lab  10/18/22 0329 10/20/22 0226  NA 138 138  K 3.7 3.5  CL 99 96*  CO2 30 32  GLUCOSE 270* 267*  BUN 14 15  CREATININE 0.74 0.68  CALCIUM 8.8* 9.1  MG 1.8 1.8  PHOS 3.5 3.7   GFR: Estimated Creatinine Clearance: 62.6 mL/min (by C-G formula based on SCr of 0.68 mg/dL). Liver Function Tests: No results for input(s): "AST", "ALT", "ALKPHOS", "BILITOT", "PROT", "ALBUMIN" in the last 168 hours.  No results for input(s): "LIPASE", "AMYLASE" in the last 168 hours.  No results for input(s): "AMMONIA" in the last 168 hours. Coagulation Profile: No results for input(s): "INR", "PROTIME" in the last 168 hours. Cardiac Enzymes: No results for input(s): "CKTOTAL", "CKMB", "CKMBINDEX", "TROPONINI" in the last 168 hours. BNP (last 3 results) No results for input(s): "PROBNP" in the last 8760 hours. HbA1C: No results for input(s): "HGBA1C" in the last 72 hours. CBG: Recent Labs  Lab 10/21/22 1142 10/21/22 1626 10/21/22 2128 10/22/22 0748 10/22/22 1122  GLUCAP 268* 214* 163* 273* 261*   Lipid Profile: No results for input(s): "CHOL", "HDL", "LDLCALC", "TRIG", "CHOLHDL", "LDLDIRECT" in the last 72 hours. Thyroid Function Tests: No results for input(s): "TSH", "T4TOTAL", "FREET4", "T3FREE", "THYROIDAB" in the last 72 hours. Anemia Panel: No results  for input(s): "VITAMINB12", "FOLATE", "FERRITIN", "TIBC", "IRON", "RETICCTPCT" in the last 72 hours. Sepsis Labs: No results for input(s): "PROCALCITON", "LATICACIDVEN" in the last 168 hours.  No results found for this or any previous visit (from the past 240 hour(s)).   Radiology Studies: No results found.  Scheduled Meds:  antiseptic oral rinse  15 mL Mouth Rinse BID   Chlorhexidine Gluconate Cloth  6 each Topical Daily   dexamethasone (DECADRON) injection  4 mg Intravenous Q6H   docusate sodium  100 mg Oral BID   feeding supplement  237 mL Oral BID BM   fluconazole  800 mg Oral Daily   folic acid  1 mg Oral Daily   insulin aspart   0-15 Units Subcutaneous TID WC   insulin aspart  3 Units Subcutaneous TID WC   insulin glargine-yfgn  10 Units Subcutaneous Daily   levETIRAcetam  1,000 mg Oral BID   levofloxacin  500 mg Oral Daily   lipase/protease/amylase  72,000 Units Oral TID WC   pantoprazole  40 mg Oral Daily   ramelteon  8 mg Oral QHS   senna-docusate  1 tablet Oral BID   sodium chloride flush  10-40 mL Intracatheter Q12H   Continuous Infusions:  lactated ringers 50 mL/hr at 10/22/22 1222     LOS: 8 days    Time spent: 35 mins.    Willeen Niece, MD [facility]   If 7PM-7AM, please contact night-coverage

## 2022-10-22 NOTE — Plan of Care (Signed)
  Problem: Coping: Goal: Ability to adjust to condition or change in health will improve Outcome: Progressing   Problem: Pain Managment: Goal: General experience of comfort will improve Outcome: Progressing   Problem: Safety: Goal: Ability to remain free from injury will improve Outcome: Progressing   

## 2022-10-22 NOTE — Plan of Care (Signed)
  Problem: Education: Goal: Ability to describe self-care measures that may prevent or decrease complications (Diabetes Survival Skills Education) will improve Outcome: Progressing   Problem: Coping: Goal: Ability to adjust to condition or change in health will improve Outcome: Progressing   Problem: Pain Managment: Goal: General experience of comfort will improve Outcome: Progressing

## 2022-10-22 NOTE — Progress Notes (Signed)
Daily Progress Note   Patient Name: Carolyn Hooper       Date: 10/22/2022 DOB: Jun 21, 1960  Age: 62 y.o. MRN#: [id number] Attending Physician: Willeen Niece, MD Primary Care Physician: [name], FNP Admit Date: 10/13/2022  Reason for Consultation/Follow-up: Establishing goals of care  Patient Profile/HPI: 62 y.o. female with past medical history of hidradenitis suppurativa complicated by abscess, anxiety, type 2 diabetes, hyperinsulinemia, hypertension, gestational preeclampsia, auditory neuroma, history of pancreatitis, vertigo, seizures, and history of lung cancer metastatic to brain admitted on 10/13/2022 with seizures. Also with ongoing abdominal pain d/t pancreatitis. CT head revealed progression of left frontal metastasis and edema. PMT consulted to discuss GOC.   Subjective: Chart reviewed including labs, progress notes, imaging from this and previous encounters.  Pt continues to complain of poor sleep- being treated by psych. She is agreeable to PT evaluation today. No other complaints or questions.   Vital Signs: BP (!) 154/67   Pulse (!) 58   Temp 98 F (36.7 C) (Oral)   Resp 16   Ht 5\' 8"  (1.727 m)   Wt 54.4 kg   SpO2 96%   BMI 18.25 kg/m  SpO2: SpO2: 96 % O2 Device: O2 Device: Room Air O2 Flow Rate: O2 Flow Rate (L/min): 4 L/min  Intake/output summary:  Intake/Output Summary (Last 24 hours) at 10/22/2022 1720 Last data filed at 10/22/2022 1633 Gross per 24 hour  Intake 2227.97 ml  Output 2500 ml  Net -272.03 ml   LBM: Last BM Date : 10/18/22 Baseline Weight: Weight: 54.4 kg Most recent weight: Weight: 54.4 kg       Palliative Assessment/Data: PPS: 50%      Patient Active Problem List   Diagnosis Date Noted   Seizures (HCC) 10/13/2022   Pancreatic  pseudocyst 10/13/2022   Hypokalemia 10/13/2022   Intractable pain 09/22/2022   Abdominal pain 09/21/2022   GERD (gastroesophageal reflux disease) 09/21/2022   Elevated blood pressure reading 09/21/2022   Obesity (BMI 30-39.9) 09/10/2022   Acute pancreatitis 09/04/2022   Pyuria 09/04/2022   Cholecystitis, acute 07/27/2022   Fever, unknown origin 07/27/2022   Lower abdominal pain 07/27/2022   Diarrhea 07/27/2022   Fever 07/26/2022   Cholelithiasis 07/16/2022   Primary insomnia 07/12/2022   Swelling 06/20/2022   Encounter for antineoplastic chemotherapy 06/05/2022   Encounter for  antineoplastic immunotherapy 06/05/2022   Port-A-Cath in place 04/24/2022   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 [facility]) 03/07/2022   Focal seizure (HCC) 03/07/2022   Non-small cell lung cancer (HCC) 02/25/2022   Goals of care, counseling/discussion 02/25/2022   Lung mass 02/13/2022   Subarachnoid hemorrhage (HCC) 02/12/2022   Aphasia 02/12/2022   Brain mass 02/12/2022   Leukocytosis 02/12/2022   Mass of lower lobe of left lung 02/12/2022   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   Refusal of statin medication by patient 07/10/2020   Myalgia due to statin 07/10/2020   Controlled substance agreement signed 05/28/2018   Benzodiazepine dependence (HCC) 05/28/2018   Hyperlipidemia associated with type 2 diabetes mellitus (HCC) 04/11/2016   Morbid obesity (HCC) 04/11/2016   Former smoker 04/11/2016   Hidradenitis suppurativa 04/11/2016   GAD (generalized anxiety disorder) 08/19/2013   Type 2 diabetes mellitus (HCC) 08/19/2013   Hyperinsulinemia 08/19/2013    Palliative Care Assessment & Plan    Assessment/Recommendations/Plan  Continue current plan Pt is agreeable to short term SNF for rehab with goal of returning home To followup with Oncology- if there are no further treatment options for her cancer then she is open to hospice support   Code Status: DNR  Prognosis:  < 6  months  Discharge Planning: To Be Determined  Care plan was discussed with patient and care team.   Thank you for allowing the Palliative Medicine Team to assist in the care of this patient.  Total time:  25 mins Prolonged billing:   Greater than 50%  of this time was spent counseling and coordinating care related to the above assessment and plan.  [name], AGNP-C Palliative Medicine   Please contact Palliative Medicine Team phone at [phone] for questions and concerns.

## 2022-10-22 NOTE — Progress Notes (Addendum)
Physical Therapy Treatment Patient Details Name: Carolyn Hooper MRN: [id number] DOB: 1961/01/06 Today's Date: 10/22/2022   History of Present Illness Pt is a 62 y/o female presenting to ED on 10/13/2022 due to reports of seizure like activity with fluttering eye lids and difficulty in speech. Pt indicates 2 seizures in 2 days, fall and poor PO intake with N and V.  Pt d/c from hospital following admission 7/20-7/23/2024 in which CT revealing episode of acute pancreatitis with new small fluid collection and found to be hypokalemic. Pt seen by GI and recommendation for conservative measures. Pt provided with IV dilaudid and pain improved.  Pt hospitalized 7/6-7/15/2024 with abdominal pain and hospitalized 6/19-6/27 with epigastric pain. CT abdomen revealed enhancing fluid collection in stomach. S/p cystogastrostomy on 7/11. MRI of brain on 09/29/2022 reveals stable size of 15 x 13 x 14 mm peripherally enhancing metastasis in the posterior left frontal lobe, stable punctate enhancing metastasis in the medial right frontal lobe. Pt PMH includes but is not limited to:  non-small cell lung CA, brain metastasis, focal seizures, DM and pt currently on immunotherapy/chemotherapy.    PT Comments  Pt OOB sitting on BSC upon my arrival-RN in room. Encouraged pt to work with therapy while she was already out of the bed. Pt declined to attempt ambulation. She requested to return to sitting EOB. Pt was Min guard A for stand from bsc and pivot back over to bed.She required 2 hand support to steady herself. Pt reports she has a CT scan scheduled for later-she expresses concerns about drinking prep-recommended she direct her questions to RN who can better answer them. Per chart review, pt/family are requesting SNF placement.    If plan is discharge home, recommend the following: A little help with walking and/or transfers;A little help with bathing/dressing/bathroom;Assistance with cooking/housework;Assist for  transportation;Help with stairs or ramp for entrance   Can travel by private vehicle     Yes  Equipment Recommendations   (TBD at next venue)    Recommendations for Other Services       Precautions / Restrictions Precautions Precautions: Fall Restrictions Weight Bearing Restrictions: No     Mobility  Bed Mobility Overal bed mobility: Needs Assistance             General bed mobility comments: NT-pt declined assistance back into supine-requested to remain sitting EOB.    Transfers Overall transfer level: Needs assistance Equipment used: None Transfers: Sit to/from Stand, Bed to chair/wheelchair/BSC Sit to Stand: Min guard Stand pivot transfers: Min guard         General transfer comment: Increased time. Pt able to rise from bsc without assistance. She supported herself on the bed while therapist assisted her with toileting hygiene (she declined to attempt ADL task on her own). She was then able to step over to beside with support of bedrail with time.    Ambulation/Gait                   Stairs             Wheelchair Mobility     Tilt Bed    Modified Rankin (Stroke Patients Only)       Balance Overall balance assessment: Needs assistance, History of Falls         Standing balance support: During functional activity Standing balance-Leahy Scale: Poor  Cognition Arousal/Alertness: Awake/alert Behavior During Therapy: WFL for tasks assessed/performed Overall Cognitive Status: Impaired/Different from baseline Area of Impairment: Following commands                       Following Commands: Follows multi-step commands consistently       General Comments: some delayed responses,and mild word finding difficulties noted. requires encouragement to participate        Exercises      General Comments        Pertinent Vitals/Pain Pain Assessment Pain Assessment: Faces Faces  Pain Scale: No hurt    Home Living                          Prior Function            PT Goals (current goals can now be found in the care plan section) Progress towards PT goals: Progressing toward goals    Frequency    Min 1X/week      PT Plan Current plan remains appropriate    Co-evaluation              AM-PAC PT "6 Clicks" Mobility   Outcome Measure  Help needed turning from your back to your side while in a flat bed without using bedrails?: A Little Help needed moving from lying on your back to sitting on the side of a flat bed without using bedrails?: A Little Help needed moving to and from a bed to a chair (including a wheelchair)?: A Little Help needed standing up from a chair using your arms (e.g., wheelchair or bedside chair)?: A Little Help needed to walk in hospital room?: A Little Help needed climbing 3-5 steps with a railing? : A Lot 6 Click Score: 17    End of Session   Activity Tolerance: Patient limited by fatigue Patient left: in bed;with call bell/phone within reach (sitting EOB-pt declined return to supine despite my encouragement)   PT Visit Diagnosis: Unsteadiness on feet (R26.81);Muscle weakness (generalized) (M62.81);Difficulty in walking, not elsewhere classified (R26.2)     Time: 3875-6433 PT Time Calculation (min) (ACUTE ONLY): 21 min  Charges:    $Therapeutic Activity: 8-22 mins PT General Charges $$ ACUTE PT VISIT: 1 Visit                         Faye Ramsay, PT Acute Rehabilitation  Office: [phone]

## 2022-10-23 ENCOUNTER — Ambulatory Visit: Payer: BC Managed Care – PPO

## 2022-10-23 ENCOUNTER — Telehealth: Payer: Self-pay | Admitting: Medical Oncology

## 2022-10-23 ENCOUNTER — Other Ambulatory Visit: Payer: BC Managed Care – PPO

## 2022-10-23 ENCOUNTER — Ambulatory Visit: Payer: BC Managed Care – PPO | Admitting: Internal Medicine

## 2022-10-23 DIAGNOSIS — R569 Unspecified convulsions: Secondary | ICD-10-CM | POA: Diagnosis not present

## 2022-10-23 DIAGNOSIS — R4589 Other symptoms and signs involving emotional state: Secondary | ICD-10-CM

## 2022-10-23 DIAGNOSIS — C3490 Malignant neoplasm of unspecified part of unspecified bronchus or lung: Secondary | ICD-10-CM

## 2022-10-23 DIAGNOSIS — Z7189 Other specified counseling: Secondary | ICD-10-CM | POA: Diagnosis not present

## 2022-10-23 DIAGNOSIS — K863 Pseudocyst of pancreas: Secondary | ICD-10-CM | POA: Diagnosis not present

## 2022-10-23 LAB — GLUCOSE, CAPILLARY
Glucose-Capillary: 235 mg/dL — ABNORMAL HIGH (ref 70–99)
Glucose-Capillary: 326 mg/dL — ABNORMAL HIGH (ref 70–99)
Glucose-Capillary: 231 mg/dL — ABNORMAL HIGH (ref 70–99)
Glucose-Capillary: 219 mg/dL — ABNORMAL HIGH (ref 70–99)

## 2022-10-23 MED ORDER — INSULIN GLARGINE-YFGN 100 UNIT/ML ~~LOC~~ SOLN
12.0000 [IU] | Freq: Every day | SUBCUTANEOUS | Status: DC
  Administered 2022-10-24: 12 [IU] via SUBCUTANEOUS
  Filled 2022-10-23: qty 0.12

## 2022-10-23 MED ORDER — HYDROMORPHONE HCL 1 MG/ML IJ SOLN
0.5000 mg | INTRAMUSCULAR | Status: DC | PRN
  Administered 2022-10-23 – 2022-11-04 (×33): 0.5 mg via INTRAVENOUS
  Filled 2022-10-23 (×34): qty 0.5

## 2022-10-23 MED ORDER — LORAZEPAM 1 MG PO TABS
1.0000 mg | ORAL_TABLET | ORAL | Status: DC | PRN
  Administered 2022-10-23 – 2022-10-24 (×2): 1 mg via ORAL
  Filled 2022-10-23 (×2): qty 1

## 2022-10-23 MED ORDER — CLONAZEPAM 0.5 MG PO TBDP
0.5000 mg | ORAL_TABLET | Freq: Two times a day (BID) | ORAL | Status: DC
  Administered 2022-10-23 – 2022-11-04 (×24): 0.5 mg via ORAL
  Filled 2022-10-23 (×25): qty 1

## 2022-10-23 MED ORDER — FLUCONAZOLE 200 MG PO TABS
800.0000 mg | ORAL_TABLET | Freq: Every day | ORAL | Status: DC
  Administered 2022-10-25 – 2022-11-04 (×11): 800 mg via ORAL
  Filled 2022-10-23 (×4): qty 4
  Filled 2022-10-23 (×2): qty 8
  Filled 2022-10-23 (×5): qty 4

## 2022-10-23 MED ORDER — HYDROMORPHONE HCL 2 MG PO TABS
2.0000 mg | ORAL_TABLET | ORAL | Status: DC | PRN
  Administered 2022-10-25 – 2022-11-03 (×35): 2 mg via ORAL
  Filled 2022-10-23 (×35): qty 1

## 2022-10-23 NOTE — Progress Notes (Signed)
Daily Progress Note   Patient Name: Carolyn Hooper       Date: 10/23/2022 DOB: 02-May-1960  Age: 62 y.o. MRN#: [id number] Attending Physician: Jerald Kief, MD Primary Care Physician: [name], FNP Admit Date: 10/13/2022  Reason for Consultation/Follow-up: Establishing goals of care  Patient Profile/HPI:  62 y.o. female with past medical history of hidradenitis suppurativa complicated by abscess, anxiety, type 2 diabetes, hyperinsulinemia, hypertension, gestational preeclampsia, auditory neuroma, history of pancreatitis, vertigo, seizures, and history of lung cancer metastatic to brain admitted on 10/13/2022 with seizures. Also with ongoing abdominal pain d/t pancreatitis- being treated by ID. CT head revealed progression of left frontal metastasis and edema. PMT consulted to discuss GOC.     Subjective: Chart reviewed including labs, progress notes, imaging from this and previous encounters.  Patient had abdominal CT yesterday with findings of known large cyst with internal drain in place, as well as 2 new likely cysts, left lower lung mass is stable. Per RN plan is for IR to drain cysts.  Evaluated patient. Spent extensive time with her in the am discussing her care plan and some general issues she is having with hospital care.  She continues to have issues with sleep and anxiety.  She also requested to change her oxycodone to dilaudid. She notes that while oxycodone relieves pain minimally- the dilaudid relieves it to a point where she can sleep.  She had questions about her prognosis. She is concerned that she is going to die before she can leave the hospital. Her goal is to continue living at least until she can see her grandchild born in October.  We discussed what imminent death  looks like most of the time, with the understanding that sometimes death occurs unexpectedly.  We discussed the process of looking for a rehab bed. She requested that I facilitate a meeting with her family. I returned in the afternoon to meet with her sister, mom, son and daughter in law.  Family expressed that Raymond has always been known as the verbous, intelligent member of the family. Her problems with communication at times, and her physical decline have been frustrating for Sirine, and hard for the family to see.    We reviewed her current acute and chronic illnesses, and goals of care. Dawne reiterated her goal to live until  October to see her grandchild born.  Answered questions about prognosis- difficult to predict at this time, but I would not be surprised if Adreanna was not here in a year. I am hopeful that she will be able to survive until October.  Her family provided encouragement that they support her goal, they also give her permission to let them know when she is suffering and would choose a more comfort focused path.  Goals were clarified for continued aggressive medical interventions, limit set at DNR, plan for d/c to SNF for rehab with hope of returning home.  In addition to acute care need we discussed the need to think about big picture and the likelihood at some point she will continue to decline in functional status- and to begin to consider how she can best be cared for as she nears end of life.  Patient and family were very grateful for discussion.   Review of Systems  Constitutional:  Positive for malaise/fatigue.  Psychiatric/Behavioral:  The patient is nervous/anxious.      Physical Exam Vitals and nursing note reviewed.  Pulmonary:     Effort: Pulmonary effort is normal.  Skin:    Comments: flushed  Neurological:     Mental Status: She is alert and oriented to person, place, and time.             Vital Signs: BP 133/69 (BP Location: Left Wrist)   Pulse (!) 49    Temp 97.8 F (36.6 C) (Oral)   Resp 20   Ht 5\' 8"  (1.727 m)   Wt 54.4 kg   SpO2 94%   BMI 18.25 kg/m  SpO2: SpO2: 94 % O2 Device: O2 Device: Room Air O2 Flow Rate: O2 Flow Rate (L/min): 4 L/min  Intake/output summary:  Intake/Output Summary (Last 24 hours) at 10/23/2022 1222 Last data filed at 10/23/2022 1100 Gross per 24 hour  Intake 620 ml  Output 3250 ml  Net -2630 ml   LBM: Last BM Date : 10/18/22 Baseline Weight: Weight: 54.4 kg Most recent weight: Weight: 54.4 kg       Palliative Assessment/Data: PPS: 40%      Patient Active Problem List   Diagnosis Date Noted   Seizures (HCC) 10/13/2022   Pancreatic pseudocyst 10/13/2022   Hypokalemia 10/13/2022   Intractable pain 09/22/2022   Abdominal pain 09/21/2022   GERD (gastroesophageal reflux disease) 09/21/2022   Elevated blood pressure reading 09/21/2022   Obesity (BMI 30-39.9) 09/10/2022   Acute pancreatitis 09/04/2022   Pyuria 09/04/2022   Cholecystitis, acute 07/27/2022   Fever, unknown origin 07/27/2022   Lower abdominal pain 07/27/2022   Diarrhea 07/27/2022   Fever 07/26/2022   Cholelithiasis 07/16/2022   Primary insomnia 07/12/2022   Swelling 06/20/2022   Encounter for antineoplastic chemotherapy 06/05/2022   Encounter for antineoplastic immunotherapy 06/05/2022   Port-A-Cath in place 04/24/2022   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) 03/07/2022   Focal seizure (HCC) 03/07/2022   Non-small cell lung cancer (HCC) 02/25/2022   Goals of care, counseling/discussion 02/25/2022   Lung mass 02/13/2022   Subarachnoid hemorrhage (HCC) 02/12/2022   Aphasia 02/12/2022   Brain mass 02/12/2022   Leukocytosis 02/12/2022   Mass of lower lobe of left lung 02/12/2022   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   Refusal of statin medication by patient 07/10/2020   Myalgia due to statin 07/10/2020   Controlled substance agreement signed 05/28/2018   Benzodiazepine dependence (HCC) 05/28/2018    Hyperlipidemia associated with type 2 diabetes  mellitus (HCC) 04/11/2016   Morbid obesity (HCC) 04/11/2016   Former smoker 04/11/2016   Hidradenitis suppurativa 04/11/2016   GAD (generalized anxiety disorder) 08/19/2013   Type 2 diabetes mellitus (HCC) 08/19/2013   Hyperinsulinemia 08/19/2013    Palliative Care Assessment & Plan    Assessment/Recommendations/Plan  Continue current plan of care- GOC is to d/c to rehab Symptom management Klonazepam 0.5mg  BID PO Lorazepam 1mg  po q4hr prn D/C oxycodone Hydromorphone 2mg  po q2hr prn for moderate pain Hydromorphone 0.5mg  IV q2hr prn for severe pain   Code Status: DNR  Prognosis:  Unable to determine  Discharge Planning: Skilled Nursing Facility for rehab with Palliative care service follow-up  Care plan was discussed with patient, care team, and family.   Thank you for allowing the Palliative Medicine Team to assist in the care of this patient.  Total time:  135 minutes  Greater than 50%  of this time was spent counseling and coordinating care related to the above assessment and plan.  [name], AGNP-C Palliative Medicine   Please contact Palliative Medicine Team phone at [phone] for questions and concerns.

## 2022-10-23 NOTE — Progress Notes (Signed)
Progress Note   Patient: Carolyn Hooper WRU:[id number] DOB: Oct 31, 1960 DOA: 10/13/2022     9 DOS: the patient was seen and examined on 10/23/2022   Brief hospital course: 62 yrs old female with medical history significant of hidradenitis suppurativa complicated by abscess, anxiety, type 2 diabetes, hyperinsulinemia, hypertension, gestational preeclampsia, auditory neuroma, history of pancreatitis, vertigo, seizures, history of lung cancer,  metastatic to brain who is brought to the emergency department by a relative after having seizures for the past 2 days. She describes her being unable to talk and fast blinking of her eyelids.  Patient also continued to complaining about abdominal pain from pancreatitis. Pertinent  labs include lipase 297, CT head showing progression of left frontal metastasis associated with vasogenic edema since 2 weeks ago.  Mild new mass effect on the left lateral ventricle but no midline shift or other complicating features.  Patient is admitted for further evaluation and started on Decadron as per Dr. Liana Gerold recommendation.   Assessment and Plan: Seizures: Likely secondary to metastatic brain lesion. 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 Case was discussed with neuro-oncology Dr. Barbaraann Cao. Continue dexamethasone 4 mg IVP every 6 hours. Continue Keppra 1000 mg p.o. twice daily. Seizure precautions /neurochecks. Dr. Idelle Leech discussed the case with Dr. Barbaraann Cao,  he recommended the patient can be discharged if she is doing better on Decadron and he will follow-up outpatient.   Abdominal pain secondary to acute pancreatitis: Continued on IV fluids. Analgesics as needed. Antiemetics as needed. Continue Pantoprazole 40 mg p.o. daily. Pancreatic enzymes before meals/snacks.   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 Continue fluconazole 800 mg p.o. daily per ID. Continue Levaquin 500 mg p.o. daily. ID  following. CT with findings of 28x55mm fluid collection between pancreatic body and stomach plus another 14x2mm fluid collection in the tail -Discussed with ID who initially considered diagnostic drain of fluid -Per IR, area not amenable to drainage   Type 2 diabetes with hyperglycemia: Last HbA1c was 8.4% on 09/04/2022. Carbohydrate modified diet. CBG monitoring with RI SS. Continue Semglee 10 units daily.   GAD (generalized anxiety disorder) Continue Klonopin 0.5 mg every 12 hours as needed.   GERD (gastroesophageal reflux disease) Continue pantoprazole 40 mg daily. Antiacid, H2 blocker as needed.   Hypokalemia Replaced.  Resolved.   Suicidal thoughts: Patient has told staff that she has thoughts about hurting herself.   Environmental education officer ordered.  Psychiatry is consulted, not a suicidal risk Safety sitter discontinued. Psych signed off, Not suicidal.   Goals of care: Palliative Care is following -recs noted for SNF for rehab with palliative care service follow up   Subjective: Feeling frustrated that she is having difficulty telling staff what she needs when she hits the call bell  Physical Exam: Vitals:   10/22/22 2116 10/22/22 2238 10/23/22 0535 10/23/22 1319  BP: 116/75 117/62 133/69 (!) 142/90  Pulse: 91 98 (!) 49 66  Resp: 14  20 18   Temp: 97.8 F (36.6 C) 98.1 F (36.7 C) 97.8 F (36.6 C) (!) 97.4 F (36.3 C)  TempSrc: Oral Oral Oral Oral  SpO2: 93% 96% 94% 96%  Weight:      Height:       General exam: Awake, laying in bed, in nad Respiratory system: Normal respiratory effort, no wheezing Cardiovascular system: regular rate, s1, s2 Gastrointestinal system: Soft, nondistended, positive BS Central nervous system: CN2-12 grossly intact, strength intact Extremities: Perfused, no clubbing Skin: Normal skin turgor, no  notable skin lesions seen Psychiatry: Mood normal // no visual hallucinations   Data Reviewed:  Labs reviewed: Na 137, K 3.7, Cr  0.72, WBC 8.4, Hgb 11.3  Family Communication: Pt in room, family not at bedside  Disposition: Status is: Inpatient Remains inpatient appropriate because: severity of illness  Planned Discharge Destination: Skilled nursing facility    Author: Rickey Barbara, MD 10/23/2022 6:34 PM  For on call review [email or web address].

## 2022-10-23 NOTE — [place] FL2 (Signed)
MEDICAID FL2 LEVEL OF CARE FORM     IDENTIFICATION  Patient Name: Carolyn Hooper Birthdate: 11/07/1960 Sex: female Admission Date (Current Location): 10/13/2022  [facility] and [place] Number:  Producer, television/film/video and Address:  [facility],  [address] [place], [place] [address]      Provider Number: [id number]  Attending Physician Name and Address:  Jerald Kief, MD  Relative Name and Phone Number:  [place], Milde)  [phone] (Mobile)    Current Level of Care: Hospital Recommended Level of Care: Skilled Nursing Facility Prior Approval Number:    Date Approved/Denied:   PASRR Number: pending  Discharge Plan: SNF    Current Diagnoses: Patient Active Problem List   Diagnosis Date Noted   Seizures (HCC) 10/13/2022   Pancreatic pseudocyst 10/13/2022   Hypokalemia 10/13/2022   Intractable pain 09/22/2022   Abdominal pain 09/21/2022   GERD (gastroesophageal reflux disease) 09/21/2022   Elevated blood pressure reading 09/21/2022   Obesity (BMI 30-39.9) 09/10/2022   Acute pancreatitis 09/04/2022   Pyuria 09/04/2022   Cholecystitis, acute 07/27/2022   Fever, unknown origin 07/27/2022   Lower abdominal pain 07/27/2022   Diarrhea 07/27/2022   Fever 07/26/2022   Cholelithiasis 07/16/2022   Primary insomnia 07/12/2022   Swelling 06/20/2022   Encounter for antineoplastic chemotherapy 06/05/2022   Encounter for antineoplastic immunotherapy 06/05/2022   Port-A-Cath in place 04/24/2022   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) 03/07/2022   Focal seizure (HCC) 03/07/2022   Non-small cell lung cancer (HCC) 02/25/2022   Goals of care, counseling/discussion 02/25/2022   Lung mass 02/13/2022   Subarachnoid hemorrhage (HCC) 02/12/2022   Aphasia 02/12/2022   Brain mass 02/12/2022   Leukocytosis 02/12/2022   Mass of lower lobe of left lung 02/12/2022   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   Refusal of statin medication by  patient 07/10/2020   Myalgia due to statin 07/10/2020   Controlled substance agreement signed 05/28/2018   Benzodiazepine dependence (HCC) 05/28/2018   Hyperlipidemia associated with type 2 diabetes mellitus (HCC) 04/11/2016   Morbid obesity (HCC) 04/11/2016   Former smoker 04/11/2016   Hidradenitis suppurativa 04/11/2016   GAD (generalized anxiety disorder) 08/19/2013   Type 2 diabetes mellitus (HCC) 08/19/2013   Hyperinsulinemia 08/19/2013    Orientation RESPIRATION BLADDER Height & Weight     Self, Situation, Place  Normal External catheter Weight: 54.4 kg Height:  5\' 8"  (172.7 cm)  BEHAVIORAL SYMPTOMS/MOOD NEUROLOGICAL BOWEL NUTRITION STATUS    Convulsions/Seizures Continent Diet  AMBULATORY STATUS COMMUNICATION OF NEEDS Skin   Extensive Assist Verbally Other (Comment) (echymosis to arms and legs)                       Personal Care Assistance Level of Assistance  Bathing, Feeding, Dressing Bathing Assistance: Limited assistance Feeding assistance: Limited assistance Dressing Assistance: Limited assistance     Functional Limitations Info  Sight, Hearing, Speech Sight Info: Impaired Hearing Info: Impaired Speech Info: Adequate    SPECIAL CARE FACTORS FREQUENCY  PT (By licensed PT), OT (By licensed OT)     PT Frequency: 5x/wk OT Frequency: 5x/wk            Contractures Contractures Info: Not present    Additional Factors Info  Code Status, Allergies, Psychotropic Code Status Info: DNR Allergies Info: Adhesive (Tape), Statins Psychotropic Info: Klonopin 0.5mg  BID prn         Current Medications (10/23/2022):  This is the current hospital active medication  list Current Facility-Administered Medications  Medication Dose Route Frequency Provider Last Rate Last Admin   acetaminophen (TYLENOL) tablet 650 mg  650 mg Oral Q6H PRN Bobette Mo, MD   650 mg at 10/23/22 7829   Or   acetaminophen (TYLENOL) suppository 650 mg  650 mg Rectal Q6H PRN  Bobette Mo, MD       antiseptic oral rinse (BIOTENE) solution 15 mL  15 mL Mouth Rinse BID Joylene Draft, NP   15 mL at 10/21/22 0810   Chlorhexidine Gluconate Cloth 2 % PADS 6 each  6 each Topical Daily Bobette Mo, MD   6 each at 10/22/22 1151   clonazePAM (KLONOPIN) tablet 0.5 mg  0.5 mg Oral BID PRN Bobette Mo, MD   0.5 mg at 10/22/22 2248   dexamethasone (DECADRON) injection 4 mg  4 mg Intravenous Q6H Tilden Fossa, MD   4 mg at 10/23/22 0540   docusate sodium (COLACE) capsule 100 mg  100 mg Oral BID Willeen Niece, MD   100 mg at 10/22/22 0829   feeding supplement (ENSURE ENLIVE / ENSURE PLUS) liquid 237 mL  237 mL Oral BID BM Willeen Niece, MD   237 mL at 10/21/22 1608   fluconazole (DIFLUCAN) tablet 800 mg  800 mg Oral Daily Willeen Niece, MD   800 mg at 10/22/22 5621   folic acid (FOLVITE) tablet 1 mg  1 mg Oral Daily Bobette Mo, MD   1 mg at 10/22/22 3086   insulin aspart (novoLOG) injection 0-15 Units  0-15 Units Subcutaneous TID WC Bobette Mo, MD   2 Units at 10/22/22 1824   insulin aspart (novoLOG) injection 3 Units  3 Units Subcutaneous TID WC Willeen Niece, MD   3 Units at 10/22/22 1823   insulin glargine-yfgn (SEMGLEE) injection 10 Units  10 Units Subcutaneous Daily Willeen Niece, MD   10 Units at 10/22/22 5784   lactated ringers infusion   Intravenous Continuous Luiz Iron, NP 50 mL/hr at 10/22/22 1222 Infusion Verify at 10/22/22 1222   levETIRAcetam (KEPPRA) tablet 1,000 mg  1,000 mg Oral BID Bobette Mo, MD   1,000 mg at 10/22/22 2102   levofloxacin (LEVAQUIN) tablet 500 mg  500 mg Oral Daily Willeen Niece, MD   500 mg at 10/22/22 6962   lipase/protease/amylase (CREON) capsule 36,000 Units  36,000 Units Oral PRN Bobette Mo, MD   36,000 Units at 10/17/22 1015   lipase/protease/amylase (CREON) capsule 72,000 Units  72,000 Units Oral TID WC Bobette Mo, MD   72,000 Units at 10/22/22 1837    LORazepam (ATIVAN) injection 1 mg  1 mg Intravenous Q4H PRN Bobette Mo, MD       ondansetron [facility]) tablet 4 mg  4 mg Oral Q6H PRN Bobette Mo, MD   4 mg at 10/20/22 1839   Or   ondansetron (ZOFRAN) injection 4 mg  4 mg Intravenous Q6H PRN Bobette Mo, MD   4 mg at 10/22/22 1150   oxyCODONE (Oxy IR/ROXICODONE) immediate release tablet 5 mg  5 mg Oral Q8H PRN Bobette Mo, MD   5 mg at 10/22/22 2117   pantoprazole (PROTONIX) EC tablet 40 mg  40 mg Oral Daily Bobette Mo, MD   40 mg at 10/22/22 9528   prochlorperazine (COMPAZINE) tablet 5 mg  5 mg Oral Q6H PRN Bobette Mo, MD       ramelteon (ROZEREM) tablet 8 mg  8  mg Oral QHS Cinderella, Margaret A   8 mg at 10/22/22 2105   senna-docusate (Senokot-S) tablet 1 tablet  1 tablet Oral BID Willeen Niece, MD   1 tablet at 10/22/22 2102   sodium chloride flush (NS) 0.9 % injection 10-40 mL  10-40 mL Intracatheter Q12H Bobette Mo, MD   10 mL at 10/21/22 1610   sodium chloride flush (NS) 0.9 % injection 10-40 mL  10-40 mL Intracatheter PRN Bobette Mo, MD       valACYclovir Ralph Dowdy) tablet 500 mg  500 mg Oral Daily PRN Bobette Mo, MD         Discharge Medications: Please see discharge summary for a list of discharge medications.  Relevant Imaging Results:  Relevant Lab Results:   Additional Information SSN: [id number]  [name], RN

## 2022-10-23 NOTE — Telephone Encounter (Signed)
Distressed- " I have to get out of this place  It is killing me.  My words are slow and no one has patience to listen to me . I am forgetful. When I ask for something it takes 45 mins and when the staff come in I have forgotten what I needed. I am quite capable of screaming and making a scene. I am ready to go to a rehab facility . I need to get out of this place. It is killing me. Please get me out of here, please".

## 2022-10-23 NOTE — Progress Notes (Signed)
OT Cancellation Note  Patient Details Name: Carolyn Hooper MRN: [id number] DOB: 1960/09/11   Cancelled Treatment:    Reason Eval/Treat Not Completed: Fatigue/lethargy limiting ability to participate. Pt was sleeping and asked for therapy to check back tomorrow.   Reuben Likes, OTR/L 10/23/2022, 4:43 PM

## 2022-10-23 NOTE — TOC PASRR Note (Signed)
30 Day PASRR Note   Patient Details  Name: Carolyn Hooper Date of Birth: 1960-04-12   Transition of Care [facility]) CM/SW Contact:    [name], RN Phone Number: 10/23/2022, 10:06 AM  To Whom It May Concern:  Please be advised that this patient will require a short-term nursing home stay - anticipated 30 days or less for rehabilitation and strengthening.   The plan is for return home.

## 2022-10-23 NOTE — Hospital Course (Addendum)
62 yrs old female with medical history significant of hidradenitis suppurativa complicated by abscess, anxiety, type 2 diabetes, hyperinsulinemia, hypertension, gestational preeclampsia, auditory neuroma, history of pancreatitis, vertigo, seizures, history of lung cancer,  metastatic to brain who is brought to the emergency department by a relative after having seizures for the past 2 days. She describes her being unable to talk and fast blinking of her eyelids.  Patient also continued to complaining about abdominal pain from pancreatitis. Pertinent  labs include lipase 297, CT head showing progression of left frontal metastasis associated with vasogenic edema since 2 weeks ago.  Mild new mass effect on the left lateral ventricle but no midline shift or other complicating features.  Patient is admitted for further evaluation and started on Decadron as per Dr. Liana Gerold recommendation.  Hospital course was complicated by positive Candida and Streptococcus viridans.  CT scan showed pancreatic infected pseudocyst versus abscess.  Patient was seen by infectious disease and GI.  No further intervention per GI at this time.  ID recommended fluconazole and Levaquin until November 29, 2022 until outpatient ID follow-up.  PT/OT has recommended SNF.   Palliative care team should follow patient as outpatient for further pain control, and continue goals of care discussions.    Assessment & Plan:  Principal Problem:   Seizures (HCC) Active Problems:   Acute pancreatitis   Abdominal pain   Type 2 diabetes mellitus (HCC)   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)   GAD (generalized anxiety disorder)   GERD (gastroesophageal reflux disease)   Pancreatic pseudocyst   Hypokalemia   Primary malignant neoplasm of lung metastatic to other site [facility])   Other chronic pain   Acute recurrent pancreatitis       Seizures: Secondary to metastatic brain disease, previous provide.  Discussed case with  neuro-oncology.  Continue Decadron and Keppra     Abdominal pain secondary to acute pancreatitis: Pain control, antiemetics, PPI.  Continue pancreatic enzymes   Pancreatic pseudocyst, Infected: Positive for Candida and Streptococcus viridans.  CT scan showed evidence of 28X 21 mm fluid collection in the pancreatic body and in the stomach concerning for infected pseudocyst versus abscess.  This is not amenable to drainage per IR.  Holding off on any endoscopic evaluation per GI.  ID recommending fluconazole 800 mg daily and Levaquin 500 mg daily until ID visit on 9/13   Type 2 diabetes with hyperglycemia: Last HbA1c was 8.4% on 09/04/2022. Adjust Semglee and Premeal insulin as necessary  Essential hypertension, uncontrolled - Norvasc 5 mg orally daily.  Adjust as necessary.  IV as needed   GAD (generalized anxiety disorder) Continue Klonopin 0.5 mg every 12 hours as needed.   GERD (gastroesophageal reflux disease) Continue pantoprazole 40 mg daily. Antiacid, H2 blocker as needed.   Hypokalemia Replaced.  Resolved.   Suicidal thoughts: Patient has told staff that she has thoughts about hurting herself.   Psychiatry was consulted and found to be not a suicidal risk, psych signed off   Goals of care: Palliative Care is following Transferred to rehab with palliative care services to follow the patient.   Afib -Currently rate controlled.  Holding off on anticoagulation due to brain mets which is high risk for bleeding   PT/OT = SNF with palliative care services to follow

## 2022-10-23 NOTE — TOC Progression Note (Addendum)
Transition of Care [facility]) - Progression Note    Patient Details  Name: Carolyn Hooper MRN: [id number] Date of Birth: 1961-02-18  Transition of Care [facility]) CM/SW Contact  [name], RN Phone Number: 10/23/2022, 11:09 AM  Clinical Narrative:   Call received from pt this morning inquiring regarding time frame of short term rehab request/authorization, NCM explained process, pt reports she is anxious to leave the hospital, explained to pt NCM has initiated the process and could possibly take up to 2 days or longer depending on insurance authorization.  Level 2 PASRR initiated, PASRR pending. Faxed out for bed offers in SNF HUB.   -3:42pm bed offers given to pt, reports will review with family.          Expected Discharge Plan and Services                                               Social Determinants of Health (SDOH) Interventions SDOH Screenings   Food Insecurity: No Food Insecurity (10/14/2022)  Housing: Low Risk  (10/14/2022)  Transportation Needs: No Transportation Needs (10/14/2022)  Utilities: Not At Risk (10/14/2022)  Depression (PHQ2-9): Medium Risk (07/12/2022)  Tobacco Use: Medium Risk (10/14/2022)    Readmission Risk Interventions    10/08/2022    4:55 PM 09/24/2022    3:55 PM 09/07/2022    2:44 PM  Readmission Risk Prevention Plan  Transportation Screening Complete Complete Complete  Medication Review Oceanographer) Complete Complete Complete  PCP or Specialist appointment within 3-5 days of discharge Complete Complete Complete  HRI or Home Care Consult Complete Complete Complete  SW Recovery Care/Counseling Consult Complete Complete Complete  Palliative Care Screening Not Applicable Not Applicable Not Applicable  Skilled Nursing Facility Not Applicable Not Applicable Not Applicable

## 2022-10-24 DIAGNOSIS — F411 Generalized anxiety disorder: Secondary | ICD-10-CM

## 2022-10-24 DIAGNOSIS — R569 Unspecified convulsions: Secondary | ICD-10-CM | POA: Diagnosis not present

## 2022-10-24 DIAGNOSIS — C3492 Malignant neoplasm of unspecified part of left bronchus or lung: Secondary | ICD-10-CM

## 2022-10-24 DIAGNOSIS — G9389 Other specified disorders of brain: Secondary | ICD-10-CM | POA: Diagnosis not present

## 2022-10-24 DIAGNOSIS — C3490 Malignant neoplasm of unspecified part of unspecified bronchus or lung: Secondary | ICD-10-CM | POA: Diagnosis not present

## 2022-10-24 DIAGNOSIS — K863 Pseudocyst of pancreas: Secondary | ICD-10-CM | POA: Diagnosis not present

## 2022-10-24 LAB — C-REACTIVE PROTEIN: CRP: 1.1 mg/dL — ABNORMAL HIGH (ref <1.0)

## 2022-10-24 LAB — SEDIMENTATION RATE: Sed Rate: 12 mm/hr (ref 0–22)

## 2022-10-24 LAB — GLUCOSE, CAPILLARY
Glucose-Capillary: 308 mg/dL — ABNORMAL HIGH (ref 70–99)
Glucose-Capillary: 270 mg/dL — ABNORMAL HIGH (ref 70–99)
Glucose-Capillary: 137 mg/dL — ABNORMAL HIGH (ref 70–99)
Glucose-Capillary: 173 mg/dL — ABNORMAL HIGH (ref 70–99)

## 2022-10-24 MED ORDER — LORAZEPAM 1 MG PO TABS
1.0000 mg | ORAL_TABLET | Freq: Four times a day (QID) | ORAL | Status: DC | PRN
  Administered 2022-10-24 – 2022-11-04 (×20): 1 mg via ORAL
  Filled 2022-10-24 (×21): qty 1

## 2022-10-24 MED ORDER — INSULIN GLARGINE-YFGN 100 UNIT/ML ~~LOC~~ SOLN
15.0000 [IU] | Freq: Every day | SUBCUTANEOUS | Status: DC
  Administered 2022-10-25: 15 [IU] via SUBCUTANEOUS
  Filled 2022-10-24: qty 0.15

## 2022-10-24 MED ORDER — AQUAPHOR EX OINT: TOPICAL_OINTMENT | CUTANEOUS | Status: DC | PRN

## 2022-10-24 NOTE — Plan of Care (Signed)
  Problem: Coping: Goal: Ability to adjust to condition or change in health will improve Outcome: Progressing   Problem: Education: Goal: Knowledge of General Education information will improve Description: Including pain rating scale, medication(s)/side effects and non-pharmacologic comfort measures Outcome: Progressing   Problem: Activity: Goal: Risk for activity intolerance will decrease Outcome: Progressing   Problem: Pain Managment: Goal: General experience of comfort will improve Outcome: Progressing

## 2022-10-24 NOTE — TOC Progression Note (Signed)
Transition of Care [facility]) - Progression Note    Patient Details  Name: Carolyn Hooper MRN: [id number] Date of Birth: January 24, 1961  Transition of Care [facility]) CM/SW Contact  [name], RN Phone Number: 10/24/2022, 2:27 PM  Clinical Narrative:  Met with pt at bedside to review decision for short term rehab, pt drowsy, did not open eyes when speaking with NCM. NCM will follow up tomorrow.          Expected Discharge Plan and Services                                               Social Determinants of Health (SDOH) Interventions SDOH Screenings   Food Insecurity: No Food Insecurity (10/14/2022)  Housing: Low Risk  (10/14/2022)  Transportation Needs: No Transportation Needs (10/14/2022)  Utilities: Not At Risk (10/14/2022)  Depression (PHQ2-9): Medium Risk (07/12/2022)  Tobacco Use: Medium Risk (10/14/2022)    Readmission Risk Interventions    10/08/2022    4:55 PM 09/24/2022    3:55 PM 09/07/2022    2:44 PM  Readmission Risk Prevention Plan  Transportation Screening Complete Complete Complete  Medication Review Oceanographer) Complete Complete Complete  PCP or Specialist appointment within 3-5 days of discharge Complete Complete Complete  HRI or Home Care Consult Complete Complete Complete  SW Recovery Care/Counseling Consult Complete Complete Complete  Palliative Care Screening Not Applicable Not Applicable Not Applicable  Skilled Nursing Facility Not Applicable Not Applicable Not Applicable

## 2022-10-24 NOTE — TOC Progression Note (Signed)
Transition of Care [facility]) - Progression Note    Patient Details  Name: Carolyn Hooper MRN: [id number] Date of Birth: [birth year]  Transition of Care [facility]) CM/SW Contact  [name], RN Phone Number: 10/24/2022, 8:19 AM  Clinical Narrative:   PASRR# [id number]         Expected Discharge Plan and Services                                               Social Determinants of Health (SDOH) Interventions SDOH Screenings   Food Insecurity: No Food Insecurity (10/14/2022)  Housing: Low Risk  (10/14/2022)  Transportation Needs: No Transportation Needs (10/14/2022)  Utilities: Not At Risk (10/14/2022)  Depression (PHQ2-9): Medium Risk (07/12/2022)  Tobacco Use: Medium Risk (10/14/2022)    Readmission Risk Interventions    10/08/2022    4:55 PM 09/24/2022    3:55 PM 09/07/2022    2:44 PM  Readmission Risk Prevention Plan  Transportation Screening Complete Complete Complete  Medication Review (RN Care Manager) Complete Complete Complete  PCP or Specialist appointment within 3-5 days of discharge Complete Complete Complete  HRI or Home Care Consult Complete Complete Complete  SW Recovery Care/Counseling Consult Complete Complete Complete  Palliative Care Screening Not Applicable Not Applicable Not Applicable  Skilled Nursing Facility Not Applicable Not Applicable Not Applicable

## 2022-10-24 NOTE — Progress Notes (Signed)
OT Cancellation Note  Patient Details Name: Carolyn Hooper MRN: [id number] DOB: 01/18/1961   Cancelled Treatment:    Reason Eval/Treat Not Completed: Patient declined, no reason specified. Pt did not wish to participate in OT treatment this AM. Reported that she was scared when asked if she needed anything although did not want to talk about it when prompted. Pt kept eyes closed during entire conversation. Will re-attempt OT treatment again at a later date. If pt continues to decline participation, OT will sign off.   Limmie Patricia, OTR/L,CBIS  Supplemental OT - [facility] and [facility] Secure Chat Preferred   10/24/2022, 1:30 PM

## 2022-10-24 NOTE — Progress Notes (Signed)
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 PD-L1 expression is negative.   PRIOR THERAPY: Status post SRS to brain metastasis completed March 26, 2022    CURRENT THERAPY: Systemic chemotherapy with carboplatin for AUC of 5, Alimta 500 Mg/M2 and Keytruda 200 Mg IV every 3 weeks status post 9 cycles. First dose was given on March 06, 2022.   Subjective: The patient is seen and examined today.  She requested to be seen in the hospital because she has some many issues and she want me to be aware of her problems.  She was a little bit sleepy this morning.  She denied having any current chest pain but has shortness of breath with exertion with mild cough and no hemoptysis.  She was admitted with seizure activity and she was seen by Dr. Barbaraann Cao who started her on Decadron and adjusted her dose of Keppra.  The patient also has history of pancreatitis that likely secondary to infected pancreatic pseudocyst and she is followed by infectious disease and gastroenterology.  Objective: Vital signs in last 24 hours: Temp:  [97.4 F (36.3 C)-97.8 F (36.6 C)] 97.8 F (36.6 C) (08/08 0456) Pulse Rate:  [52-67] 52 (08/08 0456) Resp:  [16-18] 16 (08/08 0456) BP: (123-151)/(58-90) 151/58 (08/08 0456) SpO2:  [95 %-99 %] 95 % (08/08 0456)  Intake/Output from previous day: 08/07 0701 - 08/08 0700 In: 2318.4 [P.O.:380; I.V.:1938.4] Out: 1750 [Urine:1750] Intake/Output this shift: No intake/output data recorded.  General appearance: cooperative, fatigued, and no  distress Resp: clear to auscultation bilaterally Cardio: regular rate and rhythm, S1, S2 normal, no murmur, click, rub or gallop GI: soft, non-tender; bowel sounds normal; no masses,  no organomegaly Extremities: extremities normal, atraumatic, no cyanosis or edema  Lab Results:  Recent Labs    10/23/22 0412 10/24/22 0245  WBC 8.4 10.1  HGB 11.3* 11.3*  HCT 36.4 36.5  PLT 116* 105*   BMET Recent Labs    10/23/22 0412 10/24/22 0245  NA 137 136  K 3.7 3.8  CL 96* 95*  CO2 31 30  GLUCOSE 229* 252*  BUN 19 23  CREATININE 0.72 0.81  CALCIUM 8.5* 8.6*    Studies/Results: CT ABDOMEN PELVIS W CONTRAST  Result Date: 10/22/2022 CLINICAL DATA:  Infected pseudocyst. EXAM: CT ABDOMEN AND PELVIS WITH CONTRAST TECHNIQUE: Multidetector CT imaging of the abdomen and pelvis was performed using the standard protocol following bolus administration of intravenous contrast. RADIATION DOSE REDUCTION: This exam was performed according to the departmental dose-optimization program which includes automated exposure control, adjustment of the mA and/or kV according to patient size and/or use of iterative reconstruction technique. CONTRAST:  OMNIPAQUE IOHEXOL 300 MG/ML  SOLN COMPARISON:  October 05, 2022. FINDINGS: Lower chest: Minimal right pleural effusion is noted with minimal adjacent subsegmental atelectasis. Stable 5.6 x 4.0 cm mass noted medially in left lower lobe. Hepatobiliary: Large gallstone is again noted. No or biliary dilatation is noted. Liver is unremarkable. Pancreas: 2.8 x 2.1 cm fluid collection is seen between pancreatic body and stomach which  is slightly enlarged compared to prior exam and most consistent with pseudocyst. 14 x 13 mm low density is noted in pancreatic tail most consistent with pseudocyst. 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 Overall, this  pseudocyst is significantly smaller, currently measuring 6.2 x 1.8 cm. Spleen: Normal in size without focal abnormality. Adrenals/Urinary Tract: Adrenal glands are unremarkable. Kidneys are normal, without renal calculi, focal lesion, or hydronephrosis. Bladder is unremarkable. Stomach/Bowel: As noted above, there is a drainage stent extending from pseudo cyst noted along greater curvature stomach into gastric lumen. There is no evidence of bowel obstruction or inflammation. Sigmoid diverticulosis is noted without inflammation. The appendix is unremarkable. Vascular/Lymphatic: Aortic atherosclerosis. No enlarged abdominal or pelvic lymph nodes. Reproductive: Status post hysterectomy. No adnexal masses. Other: No abdominal wall hernia or abnormality. No abdominopelvic ascites. Musculoskeletal: No acute or significant osseous findings. IMPRESSION: Largest pancreatic cyst noted along the greater curvature of stomach is significantly smaller status post placement of internal drainage stent into gastric lumen. However, there are 2 other probable pseudocysts that appear to be enlarged compared to prior exam, 1 measuring 28 x 21 mm between the pancreatic body and stomach and another measuring 14 x 13 mm in the pancreatic tail. Large gallstone is noted. Minimal right pleural effusion is noted with minimal adjacent subsegmental atelectasis. Grossly stable left lower lobe mass is noted. Aortic Atherosclerosis (ICD10-I70.0). Electronically Signed   By: Carolyn Hooper M.D.   On: 10/22/2022 17:46    Medications: I have reviewed the patient's current medications.  CODE STATUS: No CODE BLUE  Assessment/Plan: This is a very pleasant 62 years old white female with a stage IV non-small cell lung cancer, adenosquamous carcinoma diagnosed in November 2023 with no actionable mutations and negative PD-L1 expression.  She has history of brain metastasis status post SRS to brain lesions.  She started systemic chemotherapy initially  with carboplatin, Alimta and Keytruda and currently on maintenance treatment with Alimta and Keytruda starting from cycle #5 for a total dose of 9 cycles.  She has been tolerating her systemic therapy well but she had frequent admission to the hospital with seizure activity as well as abdominal pain secondary to acute pancreatitis from the pancreatic pseudocyst and cholelithiasis and biliary stone. The patient is feeling better today except for the confusion at time. For the seizure activity she is followed by Dr. Barbaraann Cao and he adjusted her treatment with Keppra and also Decadron. For the pancreatitis, she is currently followed by infectious disease and gastroenterology. Regarding the metastatic non-small cell lung cancer, I will hold any systemic therapy for now until improvement of her condition. Disposition the patient may benefit from discharge to a skilled nursing facility for rehabilitation and close monitoring. I will arrange a follow-up appointment for her on outpatient basis after discharge. Thank you for taking good care of Carolyn Hooper, please call if you have any questions.  LOS: 10 days    Lajuana Matte 10/24/2022

## 2022-10-24 NOTE — Progress Notes (Signed)
Daily Progress Note   Patient Name: Carolyn Hooper       Date: 10/24/2022 DOB: 01/17/1961  Age: 62 y.o. MRN#: [id number] Attending Physician: Jerald Kief, MD Primary Care Physician: [name], FNP Admit Date: 10/13/2022  Reason for Consultation/Follow-up: Establishing goals of care  Patient Profile/HPI:  62 y.o. female with past medical history of hidradenitis suppurativa complicated by abscess, anxiety, type 2 diabetes, hyperinsulinemia, hypertension, gestational preeclampsia, auditory neuroma, history of pancreatitis, vertigo, seizures, and history of lung cancer metastatic to brain admitted on 10/13/2022 with seizures. Also with ongoing abdominal pain d/t pancreatitis- being treated by ID. CT head revealed progression of left frontal metastasis and edema. PMT consulted to discuss GOC.     Subjective: Chart reviewed including labs, progress notes, imaging from this and previous encounters.  On evaluation patient was sleeping soundly. As she has complained of no sleep during my last few visits I did not make attempts to awaken her.  Noted visit with Dr. Arbutus Ped today- chemotherapy on hold while patient attempts to recover functional status.  Attempted to call son, [name] and sister Dorene Grebe. No answer.   ROS   Physical Exam Vitals and nursing note reviewed.  Pulmonary:     Effort: Pulmonary effort is normal.  Neurological:     Comments: sleeping             Vital Signs: BP (!) 151/58 (BP Location: Left Arm)   Pulse (!) 52   Temp 97.8 F (36.6 C) (Oral)   Resp 16   Ht 5\' 8"  (1.727 m)   Wt 54.4 kg   SpO2 95%   BMI 18.25 kg/m  SpO2: SpO2: 95 % O2 Device: O2 Device: Room Air O2 Flow Rate: O2 Flow Rate (L/min): 4 L/min  Intake/output summary:  Intake/Output Summary  (Last 24 hours) at 10/24/2022 1329 Last data filed at 10/24/2022 1158 Gross per 24 hour  Intake 1014.22 ml  Output 2600 ml  Net -1585.78 ml   LBM: Last BM Date : 10/18/22 Baseline Weight: Weight: 54.4 kg Most recent weight: Weight: 54.4 kg       Palliative Assessment/Data: PPS: 40%      Patient Active Problem List   Diagnosis Date Noted  . Seizures (HCC) 10/13/2022  . Pancreatic pseudocyst 10/13/2022  . Hypokalemia 10/13/2022  . Intractable pain  09/22/2022  . Abdominal pain 09/21/2022  . GERD (gastroesophageal reflux disease) 09/21/2022  . Elevated blood pressure reading 09/21/2022  . Obesity (BMI 30-39.9) 09/10/2022  . Acute pancreatitis 09/04/2022  . Pyuria 09/04/2022  . Cholecystitis, acute 07/27/2022  . Fever, unknown origin 07/27/2022  . Lower abdominal pain 07/27/2022  . Diarrhea 07/27/2022  . Fever 07/26/2022  . Cholelithiasis 07/16/2022  . Primary insomnia 07/12/2022  . Swelling 06/20/2022  . Encounter for antineoplastic chemotherapy 06/05/2022  . Encounter for antineoplastic immunotherapy 06/05/2022  . Port-A-Cath in place 04/24/2022  . 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) 03/07/2022  . Focal seizure (HCC) 03/07/2022  . Non-small cell lung cancer (HCC) 02/25/2022  . Goals of care, counseling/discussion 02/25/2022  . Lung mass 02/13/2022  . Subarachnoid hemorrhage (HCC) 02/12/2022  . Aphasia 02/12/2022  . Brain mass 02/12/2022  . Leukocytosis 02/12/2022  . Mass of lower lobe of left lung 02/12/2022  . 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  . Refusal of statin medication by patient 07/10/2020  . Myalgia due to statin 07/10/2020  . Controlled substance agreement signed 05/28/2018  . Benzodiazepine dependence (HCC) 05/28/2018  . Hyperlipidemia associated with type 2 diabetes mellitus (HCC) 04/11/2016  . Morbid obesity (HCC) 04/11/2016  . Former smoker 04/11/2016  . Hidradenitis suppurativa 04/11/2016  . GAD (generalized anxiety  disorder) 08/19/2013  . Type 2 diabetes mellitus (HCC) 08/19/2013  . Hyperinsulinemia 08/19/2013    Palliative Care Assessment & Plan    Assessment/Recommendations/Plan  Continue current plan Will refer for Palliative to follow at SNF GOC are to continue life prolonging measures, limit set at DNR   Code Status: DNR  Prognosis:  Unable to determine  Discharge Planning: Skilled Nursing Facility for rehab with Palliative care service follow-up  Care plan was discussed with patient, family and careteam.   Thank you for allowing the Palliative Medicine Team to assist in the care of this patient.  Total time: 35 mins  Greater than 50%  of this time was spent counseling and coordinating care related to the above assessment and plan.  [name], AGNP-C Palliative Medicine   Please contact Palliative Medicine Team phone at [phone] for questions and concerns.

## 2022-10-24 NOTE — Progress Notes (Signed)
Reviewed and educated patient about medications. She stated that she was not going to eat or take any of her medications. She said that she was sleeping all day. Notified Dr Rhona Leavens in regards to the patient refusing medications and not eating.

## 2022-10-24 NOTE — Progress Notes (Signed)
Progress Note   Patient: Carolyn Hooper MVH:[id number] DOB: 12-30-1960 DOA: 10/13/2022     10 DOS: the patient was seen and examined on 10/24/2022   Brief hospital course: 62 yrs old female with medical history significant of hidradenitis suppurativa complicated by abscess, anxiety, type 2 diabetes, hyperinsulinemia, hypertension, gestational preeclampsia, auditory neuroma, history of pancreatitis, vertigo, seizures, history of lung cancer,  metastatic to brain who is brought to the emergency department by a relative after having seizures for the past 2 days. She describes her being unable to talk and fast blinking of her eyelids.  Patient also continued to complaining about abdominal pain from pancreatitis. Pertinent  labs include lipase 297, CT head showing progression of left frontal metastasis associated with vasogenic edema since 2 weeks ago.  Mild new mass effect on the left lateral ventricle but no midline shift or other complicating features.  Patient is admitted for further evaluation and started on Decadron as per Dr. Liana Gerold recommendation.   Assessment and Plan: Seizures: Likely secondary to metastatic brain lesion. 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 Case was discussed with neuro-oncology Dr. Barbaraann Cao. Continue dexamethasone 4 mg IVP every 6 hours. Continue Keppra 1000 mg p.o. twice daily. Seizure precautions /neurochecks. Dr. Idelle Leech discussed the case with Dr. Barbaraann Cao,  he recommended the patient can be discharged if she is doing better on Decadron and he will follow-up outpatient.   Abdominal pain secondary to acute pancreatitis: Cont analgesics as needed. Antiemetics as needed. Continue Pantoprazole 40 mg p.o. daily. Pancreatic enzymes before meals/snacks.   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 Continue fluconazole 800 mg p.o. daily per ID. Continue Levaquin 500 mg p.o. daily. ID following. CT with  findings of 28x29mm fluid collection between pancreatic body and stomach plus another 14x41mm fluid collection in the tail -Discussed with ID who initially considered diagnostic drain of fluid -Per IR, area not amenable to drainage. Discussed with GI who can consider drainage, keep NPO after midnight for now   Type 2 diabetes with hyperglycemia: Last HbA1c was 8.4% on 09/04/2022. Carbohydrate modified diet. CBG monitoring with RI SS. Continue Semglee, increase to 15 units   GAD (generalized anxiety disorder) Continue Klonopin 0.5 mg every 12 hours as needed.   GERD (gastroesophageal reflux disease) Continue pantoprazole 40 mg daily. Antiacid, H2 blocker as needed.   Hypokalemia Replaced.  Resolved.   Suicidal thoughts: Patient has told staff that she has thoughts about hurting herself.   Environmental education officer ordered.  Psychiatry is consulted, not a suicidal risk Safety sitter discontinued. Psych signed off, Not suicidal.   Goals of care: Palliative Care is following -recs noted for SNF for rehab with palliative care service follow up  Afib -new diagnosis this visit -currently rate controlled -anticoagulation contraindicated given brain met   Subjective: Feeling very eager to be discharged soon. Complaining of abd pains  Physical Exam: Vitals:   10/23/22 0535 10/23/22 1319 10/23/22 2017 10/24/22 0456  BP: 133/69 (!) 142/90 123/72 (!) 151/58  Pulse: (!) 49 66 67 (!) 52  Resp: 20 18 18 16   Temp: 97.8 F (36.6 C) (!) 97.4 F (36.3 C) 97.6 F (36.4 C) 97.8 F (36.6 C)  TempSrc: Oral Oral Oral Oral  SpO2: 94% 96% 99% 95%  Weight:      Height:       General exam: Conversant, in no acute distress Respiratory system: normal chest rise, clear, no audible wheezing Cardiovascular system: regular rhythm, s1-s2 Gastrointestinal system: Nondistended,  nontender, pos BS Central nervous system: No seizures, no tremors Extremities: No cyanosis, no joint deformities Skin: No  rashes, no pallor Psychiatry: Affect normal // periods of tearfulness   Data Reviewed:  Labs reviewed: Na 136, K 3.8, Cr 0.81, WBC 10.1, Hgb 11.3, Plts 105  Family Communication: Pt in room, family not at bedside  Disposition: Status is: Inpatient Remains inpatient appropriate because: severity of illness  Planned Discharge Destination: Skilled nursing facility    Author: Rickey Barbara, MD 10/24/2022 6:19 PM  For on call review [email or web address].

## 2022-10-25 ENCOUNTER — Other Ambulatory Visit (HOSPITAL_COMMUNITY): Payer: Self-pay

## 2022-10-25 ENCOUNTER — Encounter (HOSPITAL_COMMUNITY)
Admission: EM | Disposition: A | Discharge status: Skilled Nursing Facility | Payer: Self-pay | Source: Home / Self Care | Attending: Family Medicine

## 2022-10-25 DIAGNOSIS — K863 Pseudocyst of pancreas: Secondary | ICD-10-CM | POA: Diagnosis not present

## 2022-10-25 DIAGNOSIS — C3490 Malignant neoplasm of unspecified part of unspecified bronchus or lung: Secondary | ICD-10-CM

## 2022-10-25 DIAGNOSIS — R569 Unspecified convulsions: Secondary | ICD-10-CM | POA: Diagnosis not present

## 2022-10-25 DIAGNOSIS — K859 Acute pancreatitis without necrosis or infection, unspecified: Secondary | ICD-10-CM

## 2022-10-25 DIAGNOSIS — M86171 Other acute osteomyelitis, right ankle and foot: Secondary | ICD-10-CM | POA: Diagnosis not present

## 2022-10-25 DIAGNOSIS — G8929 Other chronic pain: Secondary | ICD-10-CM

## 2022-10-25 DIAGNOSIS — C3492 Malignant neoplasm of unspecified part of left bronchus or lung: Secondary | ICD-10-CM | POA: Diagnosis not present

## 2022-10-25 LAB — GLUCOSE, CAPILLARY
Glucose-Capillary: 220 mg/dL — ABNORMAL HIGH (ref 70–99)
Glucose-Capillary: 204 mg/dL — ABNORMAL HIGH (ref 70–99)
Glucose-Capillary: 210 mg/dL — ABNORMAL HIGH (ref 70–99)
Glucose-Capillary: 218 mg/dL — ABNORMAL HIGH (ref 70–99)

## 2022-10-25 SURGERY — UPPER ENDOSCOPIC ULTRASOUND (EUS) LINEAR
Anesthesia: Monitor Anesthesia Care

## 2022-10-25 MED ORDER — ORAL CARE MOUTH RINSE: 15.0000 mL | OROMUCOSAL | Status: DC | PRN

## 2022-10-25 MED ORDER — INSULIN ASPART 100 UNIT/ML IJ SOLN: 3.0000 [IU] | Freq: Three times a day (TID) | INTRAMUSCULAR | Status: DC

## 2022-10-25 MED ORDER — INSULIN GLARGINE-YFGN 100 UNIT/ML ~~LOC~~ SOLN
18.0000 [IU] | Freq: Every day | SUBCUTANEOUS | Status: DC
  Administered 2022-10-26 – 2022-10-27 (×2): 18 [IU] via SUBCUTANEOUS
  Filled 2022-10-25 (×2): qty 0.18

## 2022-10-25 NOTE — Progress Notes (Signed)
Physical Therapy Treatment Patient Details Name: Carolyn Hooper MRN: [id number] DOB: 11/16/1960 Today's Date: 10/25/2022   History of Present Illness Pt is a 62 y/o female presenting to ED on 10/13/2022 due to reports of seizure like activity with fluttering eye lids and difficulty in speech. Pt indicates 2 seizures in 2 days, fall and poor PO intake with N and V.  Pt d/c from hospital following admission 7/20-7/23/2024 in which CT revealing episode of acute pancreatitis with new small fluid collection and found to be hypokalemic. Pt seen by GI and recommendation for conservative measures. Pt provided with IV dilaudid and pain improved.  Pt hospitalized 7/6-7/15/2024 with abdominal pain and hospitalized 6/19-6/27 with epigastric pain. CT abdomen revealed enhancing fluid collection in stomach. S/p cystogastrostomy on 7/11. MRI of brain on 09/29/2022 reveals stable size of 15 x 13 x 14 mm peripherally enhancing metastasis in the posterior left frontal lobe, stable punctate enhancing metastasis in the medial right frontal lobe. Pt PMH includes but is not limited to:  non-small cell lung CA, brain metastasis, focal seizures, DM and pt currently on immunotherapy/chemotherapy.    PT Comments  Pt c/o severe "hoochy pain", and pointed to her perineal area. She was able to stand at bedside with a RW briefly and take a few side steps, but was limited by pain. Pt stated she just wants to go home.     If plan is discharge home, recommend the following: A little help with walking and/or transfers;A little help with bathing/dressing/bathroom;Assistance with cooking/housework;Assist for transportation;Help with stairs or ramp for entrance   Can travel by private vehicle     No  Equipment Recommendations  None recommended by PT    Recommendations for Other Services       Precautions / Restrictions Precautions Precautions: Fall Restrictions Weight Bearing Restrictions: No     Mobility  Bed Mobility Overal  bed mobility: Needs Assistance Bed Mobility: Sidelying to Sit, Sit to Supine   Sidelying to sit: HOB elevated, Max assist, Used rails   Sit to supine: Mod assist   General bed mobility comments: assist to raise trunk for sidelying to sit, then mod A for LEs back into bed    Transfers Overall transfer level: Needs assistance Equipment used: Rolling walker (2 wheels) Transfers: Sit to/from Stand Sit to Stand: From elevated surface, Mod assist           General transfer comment: Cues for safety, hand placement. Assist to steady pt and stabilize walker.    Ambulation/Gait Ambulation/Gait assistance: Contact guard assist Gait Distance (Feet): 3 Feet Assistive device: Rolling walker (2 wheels)   Gait velocity: decreased     General Gait Details: 3 side steps at edge of bed, pt c/o severe "hoochy pain" in standing so assisted back to pain   Stairs             Wheelchair Mobility     Tilt Bed    Modified Rankin (Stroke Patients Only)       Balance Overall balance assessment: Needs assistance, History of Falls         Standing balance support: During functional activity, Reliant on assistive device for balance, Bilateral upper extremity supported Standing balance-Leahy Scale: Poor                              Cognition   Behavior During Therapy: WFL for tasks assessed/performed Overall Cognitive Status: Impaired/Different from baseline  General Comments: delayed responses,and word finding difficulties noted. requires encouragement to participate. extensive discussions required before actual participation        Exercises General Exercises - Lower Extremity Long Arc Quad: AROM, Both, 10 reps, Seated    General Comments        Pertinent Vitals/Pain Pain Assessment Faces Pain Scale: Hurts whole lot Pain Location: "hoochy" Pain Descriptors / Indicators: Sore Pain Intervention(s): Limited  activity within patient's tolerance, Monitored during session, Patient requesting pain meds-RN notified    Home Living                          Prior Function            PT Goals (current goals can now be found in the care plan section) Acute Rehab PT Goals Patient Stated Goal: to be able to go home/mothers house today and to speak better PT Goal Formulation: With patient Time For Goal Achievement: 10/30/22 Potential to Achieve Goals: Fair Progress towards PT goals: Progressing toward goals    Frequency    Min 1X/week      PT Plan Current plan remains appropriate    Co-evaluation              AM-PAC PT "6 Clicks" Mobility   Outcome Measure  Help needed turning from your back to your side while in a flat bed without using bedrails?: A Little Help needed moving from lying on your back to sitting on the side of a flat bed without using bedrails?: A Lot Help needed moving to and from a bed to a chair (including a wheelchair)?: A Little Help needed standing up from a chair using your arms (e.g., wheelchair or bedside chair)?: A Lot Help needed to walk in hospital room?: A Lot Help needed climbing 3-5 steps with a railing? : Total 6 Click Score: 13    End of Session Equipment Utilized During Treatment: Gait belt Activity Tolerance: Patient limited by pain Patient left: in bed;with call bell/phone within reach;with bed alarm set Nurse Communication: Mobility status;Patient requests pain meds PT Visit Diagnosis: Unsteadiness on feet (R26.81);Muscle weakness (generalized) (M62.81);Difficulty in walking, not elsewhere classified (R26.2)     Time: 3664-4034 PT Time Calculation (min) (ACUTE ONLY): 21 min  Charges:    $Therapeutic Activity: 8-22 mins PT General Charges $$ ACUTE PT VISIT: 1 Visit                     Tamala Ser PT 10/25/2022  Acute Rehabilitation Services  Office [phone]

## 2022-10-25 NOTE — Plan of Care (Signed)
  Problem: Tissue Perfusion: Goal: Adequacy of tissue perfusion will improve Outcome: Progressing   Problem: Coping: Goal: Level of anxiety will decrease Outcome: Not Progressing   Problem: Pain Managment: Goal: General experience of comfort will improve Outcome: Not Progressing   Problem: Safety: Goal: Ability to remain free from injury will improve Outcome: Progressing

## 2022-10-25 NOTE — Progress Notes (Signed)
Progress Note   Patient: Carolyn Hooper [id number]:[id number] DOB: 05-Mar-1961 DOA: 10/13/2022     11 DOS: the patient was seen and examined on 10/25/2022   Brief hospital course: 62 yrs old female with medical history significant of hidradenitis suppurativa complicated by abscess, anxiety, type 2 diabetes, hyperinsulinemia, hypertension, gestational preeclampsia, auditory neuroma, history of pancreatitis, vertigo, seizures, history of lung cancer,  metastatic to brain who is brought to the emergency department by a relative after having seizures for the past 2 days. She describes her being unable to talk and fast blinking of her eyelids.  Patient also continued to complaining about abdominal pain from pancreatitis. Pertinent  labs include lipase 297, CT head showing progression of left frontal metastasis associated with vasogenic edema since 2 weeks ago.  Mild new mass effect on the left lateral ventricle but no midline shift or other complicating features.  Patient is admitted for further evaluation and started on Decadron as per Dr. Liana Gerold recommendation.   Assessment and Plan: Seizures: Likely secondary to metastatic brain lesion. 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 Case was discussed with neuro-oncology Dr. Barbaraann Cao. Continue dexamethasone 4 mg IVP every 6 hours. Continue Keppra 1000 mg p.o. twice daily. Seizure precautions /neurochecks. Dr. Idelle Leech discussed the case with Dr. Barbaraann Cao,  he recommended the patient can be discharged if she is doing better on Decadron and he will follow-up outpatient.   Abdominal pain secondary to acute pancreatitis: Cont analgesics as needed. Antiemetics as needed. Continue Pantoprazole 40 mg p.o. daily. Pancreatic enzymes before meals/snacks.   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 Continue fluconazole 800 mg p.o. daily per ID. Continue Levaquin 500 mg p.o. daily. ID following. CT with  findings of 28x31mm fluid collection between pancreatic body and stomach plus another 14x57mm fluid collection in the tail -Discussed with ID who initially considered diagnostic drain of fluid -Per IR, area not amenable to drainage. Discussed with GI for consideration for pseudocyst drain. Appreciate input by GI. Per GI, small new pseudocysts are not concerning for overt infection. Recs to hold off on endoscopic procedure at this time -Cont to defer abx regimen to ID   Type 2 diabetes with hyperglycemia: Last HbA1c was 8.4% on 09/04/2022. Glycemic trends elevated to the 200's Continue Semglee, increased to 18 units Will add 3 units meal coverage   GAD (generalized anxiety disorder) Continue Klonopin 0.5 mg every 12 hours as needed.   GERD (gastroesophageal reflux disease) Continue pantoprazole 40 mg daily. Antiacid, H2 blocker as needed.   Hypokalemia Replaced.  Resolved.   Suicidal thoughts: Patient has told staff that she has thoughts about hurting herself.   Environmental education officer ordered.  Psychiatry is consulted, not a suicidal risk Safety sitter discontinued. Psych signed off, Not suicidal.   Goals of care: Palliative Care is following -recs noted for SNF for rehab with palliative care service follow up -Appreciate input by GI, noted. Consideration for hospice. Would continue to address GOC  Afib -new diagnosis this visit -currently rate controlled -anticoagulation contraindicated given brain met   Subjective: Feeling frustrated about remaining in the hospital  Physical Exam: Vitals:   10/24/22 2050 10/25/22 0449 10/25/22 0917 10/25/22 1210  BP: (!) 157/47 (!) 122/58 (!) 156/69 (!) 164/87  Pulse: (!) 59 72 62 63  Resp: 16 16 16 16   Temp: 97.9 F (36.6 C) 98 F (36.7 C) 97.8 F (36.6 C) (!) 97.5 F (36.4 C)  TempSrc: Oral Oral Oral Oral  SpO2: 100%  96% 96% 97%  Weight:      Height:       General exam: Awake, laying in bed, in nad Respiratory system: Normal  respiratory effort, no wheezing Cardiovascular system: regular rate, s1, s2 Gastrointestinal system: Soft, nondistended, positive BS Central nervous system: CN2-12 grossly intact, strength intact Extremities: Perfused, no clubbing Skin: Normal skin turgor, no notable skin lesions seen Psychiatry: Mood normal // no visual hallucinations   Data Reviewed:  Labs reviewed: Na 137, K 3.9, Cr 0.65, WBC 12.5, Hgb 11.3, Plts 110  Family Communication: Pt in room, family not at bedside  Disposition: Status is: Inpatient Remains inpatient appropriate because: severity of illness  Planned Discharge Destination: Skilled nursing facility    Author: Rickey Barbara, MD 10/25/2022 5:14 PM  For on call review [email or web address].

## 2022-10-25 NOTE — Progress Notes (Signed)
Assumed care of the pt.Pt condition stable at this time. No changes in initial AM assessment at this time

## 2022-10-25 NOTE — Progress Notes (Addendum)
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)   Acute pancreatitis   Abdominal pain   GERD (gastroesophageal reflux disease)   Pancreatic pseudocyst   Hypokalemia   Primary malignant neoplasm of lung metastatic to other site [facility])   Other chronic pain   Acute recurrent pancreatitis          Assessment: 62 year old female admitted with 2 seizures, found to have frontal metastasis with vasogenic edema 2 weeks ago, ID has been following in the past admission for infected pseudocyst: #Infected pseudocyst - During 7/6-15 addition patient had presented with abdominal pains x 30 days - Underwent EUS with stent placement and pseudocyst cultures growing strep viridans and Candida glabrata.  ID was engaged patient discharged on ceftriaxone and micafungin, awaited clinical progress sensitivities.  On 7/23, micafungin was transition to full high-dose fluconazole(glabrata sens fluc SDD). initially plan was about 4 weeks from 7/11 with a EOT 8/6. - Admission 7/20-23 for acute pancreatitis.  CT abdomen pelvis on 7/20 showed pseudocyst with diminished volume compared to previous exam, new small fluid collection between peripancreatic body and stomach measuring 2.3X 1.9 cm favor small pseudocyst.  Mild soft tissue stranding about the pancreatic parenchyma favoring acute pancreatitis.  GI was engaged, no plans for endoscopic intervention at that time. - Repeat CT on 8/6 showed 2 probable pseudocysts measuring 28X 21 millimeters between pancreatic and stomach and another more measuring 14X 13 mm in pancreatic tail, large pseudocyst along greater curvature of stomach significantly smaller postdrainage.  -IR engaged, there is no window for to aspirate the  pseudocyst.  GI engaged, recommend stopping antibiotics as they do not feel it is necessary, noted that "peripancreatic fluid collection that has the pseudocyst gastrostomy stents in place certainly can remain a larger size as a result of having the stents in place but once stents were removed the cyst would pose the gastric wall and then close off over time. " No plans for EUS for reasoning as above and unclear if patient has ability to consent. #Admission for seizure secondary to likely pain meds - Malignant neoplasm of lung, mets to brain - Primary communicated with neuro-oncology, on Decadron   Recommendations: -Agree with continuing to engage palliative care and aggressive goals of care discussion with family and patient.  Patient was unable to answer my questions today,  She has had an ongoing decline.  I agree with GI that it is unclear if patient has an ability to consent/comprehend her care. -If the result of GOC discussion is life-prolonging measures with limits of DNR per palliative note on 8/8 then would continue antibiotics as without microbiological data would not be able to tell if new fluid collection represents infection. -If goals of care discussion results in pursuing comfort care then would follow protocol of stopping antibiotics. -Patient has point with infectious disease with myself on 9/3.   ID will follow peripherally, please engage with any new findings. Microbiology:   Antibiotics: Fluconazole  SUBJECTIVE: Pt is repeating the same sentence that "get out of bed". Unable ot answer question appropriately Interval: Resting in bed.   Review of Systems: Review of Systems  All other systems reviewed and are negative.    Scheduled Meds:  antiseptic  oral rinse  15 mL Mouth Rinse BID   Chlorhexidine Gluconate Cloth  6 each Topical Daily   clonazepam  0.5 mg Oral BID   dexamethasone (DECADRON) injection  4 mg Intravenous Q6H   docusate sodium  100 mg Oral BID    feeding supplement  237 mL Oral BID BM   fluconazole  800 mg Oral Daily   folic acid  1 mg Oral Daily   insulin aspart  0-15 Units Subcutaneous TID WC   insulin aspart  3 Units Subcutaneous TID WC   insulin glargine-yfgn  15 Units Subcutaneous Daily   levETIRAcetam  1,000 mg Oral BID   levofloxacin  500 mg Oral Daily   lipase/protease/amylase  72,000 Units Oral TID WC   pantoprazole  40 mg Oral Daily   ramelteon  8 mg Oral QHS   senna-docusate  1 tablet Oral BID   sodium chloride flush  10-40 mL Intracatheter Q12H   Continuous Infusions:  lactated ringers 50 mL/hr at 10/25/22 0323   PRN Meds:.acetaminophen **OR** acetaminophen, HYDROmorphone (DILAUDID) injection, HYDROmorphone, lipase/protease/amylase, LORazepam, LORazepam, ondansetron **OR** ondansetron (ZOFRAN) IV, mouth rinse, prochlorperazine, sodium chloride flush, valACYclovir Allergies  Allergen Reactions   Adhesive [Tape] Dermatitis   Statins Itching and Other (See Comments)    Myalgias, also    OBJECTIVE: Vitals:   10/24/22 2050 10/25/22 0449 10/25/22 0917 10/25/22 1210  BP: (!) 157/47 (!) 122/58 (!) 156/69 (!) 164/87  Pulse: (!) 59 72 62 63  Resp: 16 16 16 16   Temp: 97.9 F (36.6 C) 98 F (36.7 C) 97.8 F (36.6 C) (!) 97.5 F (36.4 C)  TempSrc: Oral Oral Oral Oral  SpO2: 100% 96% 96% 97%  Weight:      Height:       Body mass index is 18.25 kg/m.  Physical Exam Constitutional:      Appearance: Normal appearance.  HENT:     Head: Normocephalic and atraumatic.     Right Ear: Tympanic membrane normal.     Left Ear: Tympanic membrane normal.     Nose: Nose normal.     Mouth/Throat:     Mouth: Mucous membranes are moist.  Eyes:     Extraocular Movements: Extraocular movements intact.     Conjunctiva/sclera: Conjunctivae normal.     Pupils: Pupils are equal, round, and reactive to light.  Cardiovascular:     Rate and Rhythm: Normal rate and regular rhythm.     Heart sounds: No murmur heard.    No  friction rub. No gallop.  Pulmonary:     Effort: Pulmonary effort is normal.     Breath sounds: Normal breath sounds.  Abdominal:     General: Abdomen is flat.     Palpations: Abdomen is soft.  Skin:    General: Skin is warm and dry.  Neurological:     General: No focal deficit present.     Mental Status: She is alert.  Psychiatric:        Mood and Affect: Mood normal.       Lab Results Lab Results  Component Value Date   WBC 12.5 (H) 10/25/2022   HGB 11.3 (L) 10/25/2022   HCT 36.1 10/25/2022   MCV 98.4 10/25/2022   PLT 110 (L) 10/25/2022    Lab Results  Component Value Date   CREATININE 0.65 10/25/2022   BUN 18 10/25/2022   NA 137 10/25/2022   K 3.9 10/25/2022   CL 97 (L) 10/25/2022   CO2 30 10/25/2022  Lab Results  Component Value Date   ALT 26 10/25/2022   AST 23 10/25/2022   ALKPHOS 84 10/25/2022   BILITOT 0.7 10/25/2022        Danelle Earthly, MD [facility] 10/25/2022, 3:12 PM   I have personally spent 55 minutes involved in face-to-face and non-face-to-face activities for this patient on the day of the visit. Professional time spent includes the following activities: Preparing to see the patient (review of tests), Obtaining and/or reviewing separately obtained history (admission/discharge record), Performing a medically appropriate examination and/or evaluation , Ordering medications/tests/procedures, referring and communicating with other health care professionals, Documenting clinical information in the EMR, Independently interpreting results (not separately reported), Communicating results to the patient/family/caregiver, Counseling and educating the patient/family/caregiver and Care coordination (not separately reported).

## 2022-10-25 NOTE — Plan of Care (Signed)
Pt not progressing. 

## 2022-10-25 NOTE — Consult Note (Signed)
Consultation  Referring Provider:   THN/ID Primary Care Physician:  [name], FNP Primary Gastroenterologist:  [facility] most recently saw patient 10/05/22 we were consulted due to recurrent pseudocyst with previous  Axios drain placement on 09/26/2022 placed Dr. Meridee Score at request of [facility]. [facility]     Reason for Consultation: Recurrent pseudocyst in setting of likely gallstone pancreatitis and metastatic lung cancer  DOA: 10/13/2022         Hospital Day: 13         HPI:   Carolyn Hooper is a 62 y.o. female with past medical history significant for lung cancer with mets to brain presenting with seizures secondary to frontal masses and edema, gallstones, pancreatitis with infected pseudocyst s/p axios drain placement 07/11 with Dr. Meridee Score, presented to the ER 7/28 with seizures for 2 days and associated aphasia.  7/6 through 7/15 abdominal pain found to have infected pseudocyst, status post EUS with Dr. Meridee Score on cultures strep viridans and Candida glabrata.  Patient was found to have continuing acute pancreatitis with progression of 2 new small pseudocyst, GI consulted for evaluation of potential EUS and resampling of pseudocyst to rule out continued infection and to help with sensitivity and culture.   Patient was alone in room, family at bedside. Very difficult historian with tangential thinking, difficult to retrieve history and patient.  Patient states she is had a lot of abdominal pain and her main concern is pain and getting out.  Patient mainly appears frustrated she is unable to get pain meds when she wants and states she was promised this. She also makes it very clear that   "I do not want to die here"  She also states "I do not want to scream and yell but I am capable of it, I want to get as comfortable as possible".  I did try to review with the patient and EUS that she had the procedure before, I did discuss that we would not have time today but potentially  tomorrow but I could also not guarantee the timing of an EUS.  Also discussed the real and distinct possibility of worsening pancreatitis which would worsen her pain and cause more nausea vomiting, and potentially prolong her hospitalization.  I am unaware if patient was able to follow this consent and explanation, very repetitive with what she was saying and her main concern is leaving and pain control.  Admitting lipase 10/13/2022 297, CT showing evidence of Largest pancreatic cyst noted along the greater curvature of stomach is significantly smaller status post placement of internal drainage stent into gastric lumen. However, there are 2 other probable pseudocysts that appear to be enlarged compared to prior exam, 1 measuring 28 x 21 mm between the pancreatic body and stomach and another measuring 14 x 13 mm in the pancreatic tail. Large gallstone is noted. Minimal right pleural effusion is noted with minimal adjacent subsegmental atelectasis. Grossly stable left lower lobe mass is noted. Most current labs show albumin 2.5, AST 2.2 ALT 27 alk phos 75. WBC 10.1, previously 12.5 while on steroids/Decadron for edema, platelets thrombocytopenia at 105, hemoglobin 11.3 previously 9.8, no overt GI bleeding, no blood transfusions. Patient has been afebrile  EUS 09/21/22 EUS impression: - Pancreatic parenchymal abnormalities consisting of diffusely increased echogenicity were noted in the entire pancreas. - A cystic lesion was seen in the pancreatic body. Tissue has not been obtained. However, the endosonographic appearance is suggestive of a pancreatic pseudocyst. Stented. -  There was no sign of significant pathology in the common bile duct and in the common hepatic duct. - No malignant- appearing lymph nodes were visualized in the celiac region ( level 20) and porta hepatis region. - Cystogastrostomy was performed.  Abnormal ED labs: Abnormal Labs Reviewed  COMPREHENSIVE METABOLIC PANEL - Abnormal;  Notable for the following components:      Result Value   Potassium 3.4 (*)    Chloride 96 (*)    Glucose, Bld 111 (*)    Calcium 8.3 (*)    Total Protein 5.9 (*)    Albumin 2.6 (*)    Anion gap 17 (*)    All other components within normal limits  CBC WITH DIFFERENTIAL/PLATELET - Abnormal; Notable for the following components:   RBC 3.63 (*)    Hemoglobin 10.8 (*)    HCT 35.8 (*)    RDW 17.2 (*)    Abs Immature Granulocytes 0.19 (*)    All other components within normal limits  LIPASE, BLOOD - Abnormal; Notable for the following components:   Lipase 297 (*)    All other components within normal limits  URINALYSIS, ROUTINE W REFLEX MICROSCOPIC - Abnormal; Notable for the following components:   APPearance HAZY (*)    Ketones, ur 20 (*)    Leukocytes,Ua SMALL (*)    Bacteria, UA RARE (*)    Non Squamous Epithelial 0-5 (*)    All other components within normal limits  CBC - Abnormal; Notable for the following components:   RBC 3.31 (*)    Hemoglobin 10.1 (*)    HCT 32.9 (*)    RDW 17.2 (*)    nRBC 0.4 (*)    All other components within normal limits  COMPREHENSIVE METABOLIC PANEL - Abnormal; Notable for the following components:   Glucose, Bld 227 (*)    Calcium 8.4 (*)    Total Protein 5.6 (*)    Albumin 2.4 (*)    All other components within normal limits  GLUCOSE, CAPILLARY - Abnormal; Notable for the following components:   Glucose-Capillary 242 (*)    All other components within normal limits  GLUCOSE, CAPILLARY - Abnormal; Notable for the following components:   Glucose-Capillary 231 (*)    All other components within normal limits  GLUCOSE, CAPILLARY - Abnormal; Notable for the following components:   Glucose-Capillary 240 (*)    All other components within normal limits  GLUCOSE, CAPILLARY - Abnormal; Notable for the following components:   Glucose-Capillary 226 (*)    All other components within normal limits  GLUCOSE, CAPILLARY - Abnormal; Notable for the  following components:   Glucose-Capillary 250 (*)    All other components within normal limits  CBC - Abnormal; Notable for the following components:   RBC 3.23 (*)    Hemoglobin 9.8 (*)    HCT 32.6 (*)    MCV 100.9 (*)    RDW 17.6 (*)    All other components within normal limits  COMPREHENSIVE METABOLIC PANEL - Abnormal; Notable for the following components:   Glucose, Bld 275 (*)    Calcium 8.6 (*)    Total Protein 5.1 (*)    Albumin 2.2 (*)    All other components within normal limits  GLUCOSE, CAPILLARY - Abnormal; Notable for the following components:   Glucose-Capillary 220 (*)    All other components within normal limits  GLUCOSE, CAPILLARY - Abnormal; Notable for the following components:   Glucose-Capillary 256 (*)    All other components  within normal limits  GLUCOSE, CAPILLARY - Abnormal; Notable for the following components:   Glucose-Capillary 247 (*)    All other components within normal limits  GLUCOSE, CAPILLARY - Abnormal; Notable for the following components:   Glucose-Capillary 208 (*)    All other components within normal limits  GLUCOSE, CAPILLARY - Abnormal; Notable for the following components:   Glucose-Capillary 172 (*)    All other components within normal limits  GLUCOSE, CAPILLARY - Abnormal; Notable for the following components:   Glucose-Capillary 237 (*)    All other components within normal limits  GLUCOSE, CAPILLARY - Abnormal; Notable for the following components:   Glucose-Capillary 249 (*)    All other components within normal limits  GLUCOSE, CAPILLARY - Abnormal; Notable for the following components:   Glucose-Capillary 149 (*)    All other components within normal limits  GLUCOSE, CAPILLARY - Abnormal; Notable for the following components:   Glucose-Capillary 208 (*)    All other components within normal limits  GLUCOSE, CAPILLARY - Abnormal; Notable for the following components:   Glucose-Capillary 253 (*)    All other components  within normal limits  GLUCOSE, CAPILLARY - Abnormal; Notable for the following components:   Glucose-Capillary 279 (*)    All other components within normal limits  GLUCOSE, CAPILLARY - Abnormal; Notable for the following components:   Glucose-Capillary 229 (*)    All other components within normal limits  CBC - Abnormal; Notable for the following components:   RBC 3.49 (*)    Hemoglobin 10.5 (*)    HCT 34.8 (*)    RDW 17.5 (*)    Platelets 146 (*)    All other components within normal limits  BASIC METABOLIC PANEL - Abnormal; Notable for the following components:   Glucose, Bld 270 (*)    Calcium 8.8 (*)    All other components within normal limits  GLUCOSE, CAPILLARY - Abnormal; Notable for the following components:   Glucose-Capillary 215 (*)    All other components within normal limits  GLUCOSE, CAPILLARY - Abnormal; Notable for the following components:   Glucose-Capillary 272 (*)    All other components within normal limits  GLUCOSE, CAPILLARY - Abnormal; Notable for the following components:   Glucose-Capillary 233 (*)    All other components within normal limits  GLUCOSE, CAPILLARY - Abnormal; Notable for the following components:   Glucose-Capillary 250 (*)    All other components within normal limits  CBC - Abnormal; Notable for the following components:   RBC 3.42 (*)    Hemoglobin 10.4 (*)    HCT 34.3 (*)    MCV 100.3 (*)    RDW 17.9 (*)    Platelets 134 (*)    All other components within normal limits  GLUCOSE, CAPILLARY - Abnormal; Notable for the following components:   Glucose-Capillary 225 (*)    All other components within normal limits  GLUCOSE, CAPILLARY - Abnormal; Notable for the following components:   Glucose-Capillary 246 (*)    All other components within normal limits  GLUCOSE, CAPILLARY - Abnormal; Notable for the following components:   Glucose-Capillary 250 (*)    All other components within normal limits  GLUCOSE, CAPILLARY - Abnormal;  Notable for the following components:   Glucose-Capillary 178 (*)    All other components within normal limits  CBC - Abnormal; Notable for the following components:   RBC 3.55 (*)    Hemoglobin 10.9 (*)    HCT 35.2 (*)    RDW 17.4 (*)  Platelets 121 (*)    All other components within normal limits  BASIC METABOLIC PANEL - Abnormal; Notable for the following components:   Chloride 96 (*)    Glucose, Bld 267 (*)    All other components within normal limits  GLUCOSE, CAPILLARY - Abnormal; Notable for the following components:   Glucose-Capillary 209 (*)    All other components within normal limits  D-DIMER, QUANTITATIVE - Abnormal; Notable for the following components:   D-Dimer, Quant 2.31 (*)    All other components within normal limits  GLUCOSE, CAPILLARY - Abnormal; Notable for the following components:   Glucose-Capillary 288 (*)    All other components within normal limits  GLUCOSE, CAPILLARY - Abnormal; Notable for the following components:   Glucose-Capillary 364 (*)    All other components within normal limits  GLUCOSE, CAPILLARY - Abnormal; Notable for the following components:   Glucose-Capillary 213 (*)    All other components within normal limits  GLUCOSE, CAPILLARY - Abnormal; Notable for the following components:   Glucose-Capillary 169 (*)    All other components within normal limits  GLUCOSE, CAPILLARY - Abnormal; Notable for the following components:   Glucose-Capillary 263 (*)    All other components within normal limits  GLUCOSE, CAPILLARY - Abnormal; Notable for the following components:   Glucose-Capillary 268 (*)    All other components within normal limits  GLUCOSE, CAPILLARY - Abnormal; Notable for the following components:   Glucose-Capillary 214 (*)    All other components within normal limits  GLUCOSE, CAPILLARY - Abnormal; Notable for the following components:   Glucose-Capillary 163 (*)    All other components within normal limits  GLUCOSE,  CAPILLARY - Abnormal; Notable for the following components:   Glucose-Capillary 273 (*)    All other components within normal limits  GLUCOSE, CAPILLARY - Abnormal; Notable for the following components:   Glucose-Capillary 261 (*)    All other components within normal limits  CBC - Abnormal; Notable for the following components:   RBC 3.67 (*)    Hemoglobin 11.3 (*)    RDW 17.4 (*)    Platelets 116 (*)    All other components within normal limits  COMPREHENSIVE METABOLIC PANEL - Abnormal; Notable for the following components:   Chloride 96 (*)    Glucose, Bld 229 (*)    Calcium 8.5 (*)    Total Protein 4.8 (*)    Albumin 2.3 (*)    All other components within normal limits  GLUCOSE, CAPILLARY - Abnormal; Notable for the following components:   Glucose-Capillary 146 (*)    All other components within normal limits  GLUCOSE, CAPILLARY - Abnormal; Notable for the following components:   Glucose-Capillary 194 (*)    All other components within normal limits  GLUCOSE, CAPILLARY - Abnormal; Notable for the following components:   Glucose-Capillary 235 (*)    All other components within normal limits  GLUCOSE, CAPILLARY - Abnormal; Notable for the following components:   Glucose-Capillary 326 (*)    All other components within normal limits  GLUCOSE, CAPILLARY - Abnormal; Notable for the following components:   Glucose-Capillary 231 (*)    All other components within normal limits  COMPREHENSIVE METABOLIC PANEL - Abnormal; Notable for the following components:   Chloride 95 (*)    Glucose, Bld 252 (*)    Calcium 8.6 (*)    Total Protein 4.8 (*)    Albumin 2.5 (*)    All other components within normal limits  CBC -  Abnormal; Notable for the following components:   RBC 3.72 (*)    Hemoglobin 11.3 (*)    RDW 17.1 (*)    Platelets 105 (*)    All other components within normal limits  GLUCOSE, CAPILLARY - Abnormal; Notable for the following components:   Glucose-Capillary 219 (*)     All other components within normal limits  GLUCOSE, CAPILLARY - Abnormal; Notable for the following components:   Glucose-Capillary 308 (*)    All other components within normal limits  GLUCOSE, CAPILLARY - Abnormal; Notable for the following components:   Glucose-Capillary 270 (*)    All other components within normal limits  C-REACTIVE PROTEIN - Abnormal; Notable for the following components:   CRP 1.1 (*)    All other components within normal limits  GLUCOSE, CAPILLARY - Abnormal; Notable for the following components:   Glucose-Capillary 137 (*)    All other components within normal limits  COMPREHENSIVE METABOLIC PANEL - Abnormal; Notable for the following components:   Chloride 97 (*)    Glucose, Bld 187 (*)    Calcium 8.6 (*)    Total Protein 5.0 (*)    Albumin 2.3 (*)    All other components within normal limits  CBC - Abnormal; Notable for the following components:   WBC 12.5 (*)    RBC 3.67 (*)    Hemoglobin 11.3 (*)    RDW 16.9 (*)    Platelets 110 (*)    All other components within normal limits  GLUCOSE, CAPILLARY - Abnormal; Notable for the following components:   Glucose-Capillary 173 (*)    All other components within normal limits  GLUCOSE, CAPILLARY - Abnormal; Notable for the following components:   Glucose-Capillary 220 (*)    All other components within normal limits  CBG MONITORING, ED - Abnormal; Notable for the following components:   Glucose-Capillary 105 (*)    All other components within normal limits  CBG MONITORING, ED - Abnormal; Notable for the following components:   Glucose-Capillary 186 (*)    All other components within normal limits    Past Medical History:  Diagnosis Date   Abscess    hydroadenitis superativa   Anxiety    DM (diabetes mellitus) (HCC)    Hyperinsulinemia    Hypertension 1983   gestational - pre-eclampsia   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[facility])    Neuroma    audiotry neuroma   Pancreatitis    Seizure (HCC)     Vertigo     Surgical History:  She  has a past surgical history that includes Shoulder surgery (Right, 1960s); Abdominal hysterectomy (2006); Bronchial needle aspiration biopsy (02/19/2022); Bronchial biopsy (02/19/2022); Bronchial brushings (02/19/2022); IR IMAGING GUIDED PORT INSERTION (03/29/2022); Upper esophageal endoscopic ultrasound (eus) (N/A, 09/26/2022); biopsy (09/26/2022); pancreatic stent placement (09/26/2022); Balloon dilation (N/A, 09/26/2022); and Esophagogastroduodenoscopy (egd) with propofol (N/A, 09/26/2022). Family History:  Her family history includes Arthritis in her father; COPD in her mother; Diabetes in her father; Heart disease in her father; Kidney disease in her father; Liver disease in her father. Social History:   reports that she quit smoking about 8 months ago. Her smoking use included cigarettes. She has never used smokeless tobacco. She reports current alcohol use. She reports that she does not use drugs.  Prior to Admission medications   Medication Sig Start Date End Date Taking? Authorizing Provider  clonazePAM (KLONOPIN) 0.5 MG tablet Take 1 tablet (0.5 mg total) by mouth 2 (two) times daily. Patient taking differently: Take 0.5  mg by mouth 2 (two) times daily as needed for anxiety. 08/30/22  Yes Hawks, Edilia Bo, FNP  Ensure Max Protein (ENSURE MAX PROTEIN) LIQD Take 330 mLs (11 oz total) by mouth 2 (two) times daily. 09/11/22  Yes Hongalgi, Maximino Greenland, MD  folic acid (FOLVITE) 1 MG tablet Take 1 tablet by mouth once daily 09/16/22  Yes Heilingoetter, [name], PA-C  furosemide (LASIX) 20 MG tablet Take 1 tablet (20 mg total) by mouth daily as needed for fluid or edema. 09/20/22  Yes Hawks, [name] A, FNP  levETIRAcetam (KEPPRA) 1000 MG tablet Take 1 tablet (1,000 mg total) by mouth 2 (two) times daily. 07/31/22  Yes Vaslow, Georgeanna Lea, MD  lipase/protease/amylase (CREON) 36000 UNITS CPEP capsule Take 36,000-72,000 Units by mouth as directed. Take 2 Capsules by mouth (72,000  units) with meals and Take 1 Capsule by mouth (36,000 units) with snacks.   Yes [provider]  ondansetron (ZOFRAN) 4 MG tablet Take 1 tablet (4 mg total) by mouth every 6 (six) hours as needed for nausea. Patient taking differently: Take 4-8 mg by mouth every 6 (six) hours as needed for nausea. 07/17/22  Yes [name], [name], MD  oxyCODONE (OXY IR/ROXICODONE) 5 MG immediate release tablet Take 1 tablet (5 mg total) by mouth every 8 (eight) hours as needed for moderate pain or severe pain. 09/20/22  Yes Hawks, [name] A, FNP  pantoprazole (PROTONIX) 40 MG tablet Take 1 tablet (40 mg total) by mouth daily. 09/13/22  Yes Hongalgi, Maximino Greenland, MD  prochlorperazine (COMPAZINE) 10 MG tablet Take 1 tablet (10 mg total) by mouth every 6 (six) hours as needed. Patient taking differently: Take 10 mg by mouth every 6 (six) hours as needed for nausea or vomiting. 05/30/22  Yes Heilingoetter, [name], PA-C  TYLENOL 500 MG tablet Take 1,000 mg by mouth every 6 (six) hours as needed for mild pain or headache.   Yes [provider]  valACYclovir (VALTREX) 500 MG tablet Take 1 tablet (500 mg) by mouth 2 times daily. Patient taking differently: Take 500 mg by mouth daily as needed (for cold sores). 09/23/22  Yes Hawks, [name] A, FNP  albuterol (VENTOLIN HFA) 108 (90 Base) MCG/ACT inhaler Inhale 2 puffs into the lungs every 6 (six) hours as needed. Patient not taking: Reported on 10/05/2022 01/08/21   [name], FNP  famotidine (PEPCID) 20 MG tablet Take 1 tablet (20 mg total) by mouth 2 (two) times daily. Patient not taking: Reported on 10/13/2022 08/22/22   [name], MD  fluconazole (DIFLUCAN) 150 MG tablet Take 1 tablet by mouth every 3 days as needed (yeast infection). Patient not taking: Reported on 10/13/2022 08/29/22   [name], FNP  hydrOXYzine (ATARAX) 10 MG tablet Take 1 tablet (10 mg) by mouth 3 times daily as needed for anxiety. Patient not taking: Reported on 10/13/2022  09/30/22   Kathlen Mody, MD  insulin glargine (LANTUS SOLOSTAR) 100 UNIT/ML Solostar Pen Inject 15 Units into the skin daily. Patient not taking: Reported on 10/13/2022 09/12/22   Elease Etienne, MD  Insulin Pen Needle (PEN NEEDLES) 32G X 4 MM MISC Use to inject insulin daily as directed. 07/09/22   [name], FNP  levofloxacin (LEVAQUIN) 750 MG tablet Take 1 tablet (750 mg total) by mouth daily. Patient not taking: Reported on 10/05/2022 09/26/22 10/30/22  Kathlen Mody, MD  lidocaine (XYLOCAINE) 5 % ointment Apply topically as needed. Patient not taking: Reported on 10/13/2022 05/30/22   Lendon Colonel,  [name] A, FNP  lidocaine-prilocaine (EMLA) cream Apply to port prior to access Patient not taking: Reported on 10/13/2022 04/25/22   [name], MD  metFORMIN (GLUCOPHAGE) 1000 MG tablet Take 1 tablet (1,000 mg total) by mouth 2 (two) times daily. Patient not taking: Reported on 10/13/2022 09/16/22   [name], FNP  MYLANTA MAXIMUM STRENGTH 400-400-40 MG/5ML suspension Take 15 mLs by mouth 2 (two) times daily. Patient not taking: Reported on 10/13/2022    [provider]  polyethylene glycol powder (GLYCOLAX/MIRALAX) 17 GM/SCOOP powder Take 17 g by mouth daily as needed for mild constipation. Patient not taking: Reported on 10/13/2022 09/30/22   Kathlen Mody, MD  triamcinolone ointment (KENALOG) 0.5 % Apply topically to affected area twice daily as directed, Patient not taking: Reported on 10/13/2022 07/30/22   [name], MD    Current Facility-Administered Medications  Medication Dose Route Frequency Provider Last Rate Last Admin   acetaminophen (TYLENOL) tablet 650 mg  650 mg Oral Q6H PRN Bobette Mo, MD   650 mg at 10/23/22 6578   Or   acetaminophen (TYLENOL) suppository 650 mg  650 mg Rectal Q6H PRN Bobette Mo, MD       antiseptic oral rinse (BIOTENE) solution 15 mL  15 mL Mouth Rinse BID Joylene Draft, NP   15 mL at 10/23/22 2124   Chlorhexidine  Gluconate Cloth 2 % PADS 6 each  6 each Topical Daily Bobette Mo, MD   6 each at 10/24/22 1523   clonazePAM (KLONOPIN) disintegrating tablet 0.5 mg  0.5 mg Oral BID Barbara Cower, NP   0.5 mg at 10/24/22 2050   dexamethasone (DECADRON) injection 4 mg  4 mg Intravenous Q6H Tilden Fossa, MD   4 mg at 10/25/22 0203   docusate sodium (COLACE) capsule 100 mg  100 mg Oral BID Willeen Niece, MD   100 mg at 10/24/22 2050   feeding supplement (ENSURE ENLIVE / ENSURE PLUS) liquid 237 mL  237 mL Oral BID BM Willeen Niece, MD   237 mL at 10/23/22 1208   fluconazole (DIFLUCAN) tablet 800 mg  800 mg Oral Daily Danelle Earthly, MD       folic acid (FOLVITE) tablet 1 mg  1 mg Oral Daily Bobette Mo, MD   1 mg at 10/23/22 1211   HYDROmorphone (DILAUDID) injection 0.5 mg  0.5 mg Intravenous Q2H PRN Barbara Cower, NP   0.5 mg at 10/25/22 4696   HYDROmorphone (DILAUDID) tablet 2 mg  2 mg Oral Q2H PRN Barbara Cower, NP       insulin aspart (novoLOG) injection 0-15 Units  0-15 Units Subcutaneous TID WC Bobette Mo, MD   8 Units at 10/24/22 1249   insulin aspart (novoLOG) injection 3 Units  3 Units Subcutaneous TID WC Willeen Niece, MD   3 Units at 10/24/22 1249   insulin glargine-yfgn (SEMGLEE) injection 15 Units  15 Units Subcutaneous Daily Jerald Kief, MD       lactated ringers infusion   Intravenous Continuous Luiz Iron, NP 50 mL/hr at 10/25/22 0323 New Bag at 10/25/22 0323   levETIRAcetam (KEPPRA) tablet 1,000 mg  1,000 mg Oral BID Bobette Mo, MD   1,000 mg at 10/24/22 2049   levofloxacin (LEVAQUIN) tablet 500 mg  500 mg Oral Daily Willeen Niece, MD   500 mg at 10/24/22 1535   lipase/protease/amylase (CREON) capsule 36,000 Units  36,000 Units Oral PRN Bobette Mo, MD  36,000 Units at 10/17/22 1015   lipase/protease/amylase (CREON) capsule 72,000 Units  72,000 Units Oral TID WC Bobette Mo, MD   72,000 Units at 10/24/22 1535   LORazepam  (ATIVAN) injection 1 mg  1 mg Intravenous Q4H PRN Bobette Mo, MD       LORazepam (ATIVAN) tablet 1 mg  1 mg Oral Q6H PRN Barbara Cower, NP   1 mg at 10/24/22 2222   ondansetron (ZOFRAN) tablet 4 mg  4 mg Oral Q6H PRN Bobette Mo, MD   4 mg at 10/20/22 1839   Or   ondansetron [facility]) injection 4 mg  4 mg Intravenous Q6H PRN Bobette Mo, MD   4 mg at 10/22/22 1150   Oral care mouth rinse  15 mL Mouth Rinse PRN Jerald Kief, MD       pantoprazole (PROTONIX) EC tablet 40 mg  40 mg Oral Daily Bobette Mo, MD   40 mg at 10/23/22 1210   prochlorperazine (COMPAZINE) tablet 5 mg  5 mg Oral Q6H PRN Bobette Mo, MD       ramelteon (ROZEREM) tablet 8 mg  8 mg Oral QHS Cinderella, Margaret A   8 mg at 10/24/22 2050   senna-docusate (Senokot-S) tablet 1 tablet  1 tablet Oral BID Willeen Niece, MD   1 tablet at 10/24/22 2050   sodium chloride flush (NS) 0.9 % injection 10-40 mL  10-40 mL Intracatheter Q12H Bobette Mo, MD   10 mL at 10/21/22 4696   sodium chloride flush (NS) 0.9 % injection 10-40 mL  10-40 mL Intracatheter PRN Bobette Mo, MD       valACYclovir (VALTREX) tablet 500 mg  500 mg Oral Daily PRN Bobette Mo, MD        Allergies as of 10/13/2022 - Review Complete 10/13/2022  Allergen Reaction Noted   Adhesive [tape] Dermatitis 04/07/2011   Statins Itching and Other (See Comments) 01/08/2021    Review of Systems:    Difficult ROS    Physical Exam:  Vital signs in last 24 hours: Temp:  [97.9 F (36.6 C)-98 F (36.7 C)] 98 F (36.7 C) (08/09 0449) Pulse Rate:  [59-72] 72 (08/09 0449) Resp:  [16] 16 (08/09 0449) BP: (122-157)/(47-58) 122/58 (08/09 0449) SpO2:  [96 %-100 %] 96 % (08/09 0449) Last BM Date : 10/18/22 Last BM recorded by nurses in past 5 days No data recorded  General:   Hard of hearing, tearful, obese, female  Head:  cushingoid facial features with toe ectasias Eyes: sclerae anicteric,conjunctive  pink  Heart:  regular rate and rhythm Pulm: Clear anteriorly; no wheezing Abdomen:  Soft, Obese AB, Sluggish bowel sounds. moderate tenderness in the epigastrium. With guarding and Without rebound  Extremities: with edema left greater than right.  Msk:  Symmetrical without gross deformities. Neurologic:  Alert and oriented x 2, severe slowed reflexes, aphasia Skin: Bilateral ecchymosis Psychiatric:  Cooperative but tangential thinking, tearful, flat effect.   LAB RESULTS: Recent Labs    10/23/22 0412 10/24/22 0245 10/25/22 0306  WBC 8.4 10.1 12.5*  HGB 11.3* 11.3* 11.3*  HCT 36.4 36.5 36.1  PLT 116* 105* 110*   BMET Recent Labs    10/23/22 0412 10/24/22 0245 10/25/22 0306  NA 137 136 137  K 3.7 3.8 3.9  CL 96* 95* 97*  CO2 31 30 30   GLUCOSE 229* 252* 187*  BUN 19 23 18   CREATININE 0.72 0.81 0.65  CALCIUM 8.5* 8.6* 8.6*  LFT Recent Labs    10/25/22 0306  PROT 5.0*  ALBUMIN 2.3*  AST 23  ALT 26  ALKPHOS 84  BILITOT 0.7   PT/INR No results for input(s): "LABPROT", "INR" in the last 72 hours.  STUDIES: No results found.    Impression    Recurrent acute pancreatitis most likely secondary to gallstones Previous admission 7/6 through 7/15 for infected pseudocyst status post EUS with Axios drain 7/11 with Dr. Meridee Score cultures strep viridans and Candida glabrata, has been following with ID and on antibiotics outpatient. CT large pancreatic cyst greater curvature of stomach smaller status post plate splint of internal drain, 2 probable pseudocyst enlarged compared to prior exam 28 x 21 mm and 14 x 13 mm  Patient is been afebrile, was initially admitted due to seizures with aphasia found to have worsening mets with associated edema on Decadron. Patient also had abdominal pain and found to have 2 new small pseudocyst, previous pseudocyst has decreased in size but does have stent placed this will not have a resolution. During patient's interview I do not believe  she would be consentable at this time, also with patient's lung cancer with mets currently on antibiotics without any current signs of infection with such small new pseudocyst do question if this would be low yield for an EUS and going against with the patient's wishing with being able to leave the hospital putting her at greater risk for recurrent pancreatitis and complications. Discussed with ID potentially doing trial off antibiotics to see if new pseudocyst or infectious versus pseudocyst from recurrent gallstone pancreatitis, ID is not comfortable with stopping antibiotics and last they had a sample of the pseudocyst. At this time I do think the EUS may be more risk than benefit. I think also at this time maybe there needs to be a meeting with the family and the patient in hospice/palliative care with a distinct plan with goals of care that align with the patient's wants and needs.  Very difficult situation, further recommendations per Dr. Meridee Score.  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   Doree Albee  10/25/2022, 8:26 AM

## 2022-10-25 NOTE — TOC Progression Note (Addendum)
Transition of Care [facility]) - Progression Note    Patient Details  Name: Carolyn Hooper MRN: [id number] Date of Birth: 1960-05-01  Transition of [facility]) CM/SW Contact  [name], RN Phone Number: 10/25/2022, 9:55 AM  Clinical Narrative:   Met with pt at bedside, pt reports she has not selected a SNF bed offer and requested NCM to choose for her, NCM explained I am unable to select for her. NCM outreached to pt's son [name]), reports he was not seen bed offers, requested bed offers be emailed to him at [email or web address].   -2:08pm Short term rehab bed offer list emailed to pt's son [name]) at email above per his request. NCM made a note instructing [name] to contact the 4E nurses stattion (phone number provided) and ask to speak with the SW or Nurse Case manager assigned to the unit to notify of SNF chosen. TOC will continue to follow.    -5:23PM Call received from [facility], pt's son, confirmed [facility] selected for short term rehab- SNF. Text sent to Kia at [facility] per her request to text pt information for initiation for insurance authorization on Monday 8/12. Text sent as requested, await authorization.         Expected Discharge Plan and Services                                               Social Determinants of Health (SDOH) Interventions SDOH Screenings   Food Insecurity: No Food Insecurity (10/14/2022)  Housing: Low Risk  (10/14/2022)  Transportation Needs: No Transportation Needs (10/14/2022)  Utilities: Not At Risk (10/14/2022)  Depression (PHQ2-9): Medium Risk (07/12/2022)  Tobacco Use: Medium Risk (10/14/2022)    Readmission Risk Interventions    10/08/2022    4:55 PM 09/24/2022    3:55 PM 09/07/2022    2:44 PM  Readmission Risk Prevention Plan  Transportation Screening Complete Complete Complete  Medication Review [employer]) Complete Complete Complete  PCP or Specialist appointment within 3-5 days of discharge  Complete Complete Complete  HRI or Home Care Consult Complete Complete Complete  SW Recovery Care/Counseling Consult Complete Complete Complete  Palliative Care Screening Not Applicable Not Applicable Not Applicable  Skilled Nursing Facility Not Applicable Not Applicable Not Applicable

## 2022-10-26 ENCOUNTER — Other Ambulatory Visit (HOSPITAL_COMMUNITY): Payer: Self-pay

## 2022-10-26 DIAGNOSIS — R569 Unspecified convulsions: Secondary | ICD-10-CM | POA: Diagnosis not present

## 2022-10-26 DIAGNOSIS — C3492 Malignant neoplasm of unspecified part of left bronchus or lung: Secondary | ICD-10-CM | POA: Diagnosis not present

## 2022-10-26 LAB — GLUCOSE, CAPILLARY
Glucose-Capillary: 236 mg/dL — ABNORMAL HIGH (ref 70–99)
Glucose-Capillary: 259 mg/dL — ABNORMAL HIGH (ref 70–99)
Glucose-Capillary: 228 mg/dL — ABNORMAL HIGH (ref 70–99)
Glucose-Capillary: 188 mg/dL — ABNORMAL HIGH (ref 70–99)

## 2022-10-26 NOTE — Progress Notes (Signed)
Progress Note   Patient: Carolyn Hooper [id number]:[id number] DOB: [birth year] DOA: 10/13/2022     12 DOS: the patient was seen and examined on 10/26/2022   Brief hospital course: 62 yrs old female with medical history significant of hidradenitis suppurativa complicated by abscess, anxiety, type 2 diabetes, hyperinsulinemia, hypertension, gestational preeclampsia, auditory neuroma, history of pancreatitis, vertigo, seizures, history of lung cancer,  metastatic to brain who is brought to the emergency department by a relative after having seizures for the past 2 days. She describes her being unable to talk and fast blinking of her eyelids.  Patient also continued to complaining about abdominal pain from pancreatitis. Pertinent  labs include lipase 297, CT head showing progression of left frontal metastasis associated with vasogenic edema since 2 weeks ago.  Mild new mass effect on the left lateral ventricle but no midline shift or other complicating features.  Patient is admitted for further evaluation and started on Decadron as per Dr. Liana Gerold recommendation.   Assessment and Plan: Seizures: Likely secondary to metastatic brain lesion. 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 Case was discussed with neuro-oncology Dr. Barbaraann Cao. Continue dexamethasone 4 mg IVP every 6 hours. Continue Keppra 1000 mg p.o. twice daily. Seizure precautions /neurochecks. Dr. Idelle Leech discussed the case with Dr. Barbaraann Cao,  he recommended the patient can be discharged if she is doing better on Decadron and he will follow-up outpatient.   Abdominal pain secondary to acute pancreatitis: Cont analgesics as needed. Antiemetics as needed. Continue Pantoprazole 40 mg p.o. daily. Pancreatic enzymes before meals/snacks.   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 Continue fluconazole 800 mg p.o. daily per ID. Continue Levaquin 500 mg p.o. daily. ID following. CT with  findings of 28x56mm fluid collection between pancreatic body and stomach plus another 14x47mm fluid collection in the tail -Discussed with ID who initially considered diagnostic drain of fluid -Per IR, area not amenable to drainage. Discussed with GI for consideration for pseudocyst drain. Appreciate input by GI. Per GI, small new pseudocysts are not concerning for overt infection. Recs to hold off on endoscopic procedure at this time -Deferring abx regimen to ID   Type 2 diabetes with hyperglycemia: Last HbA1c was 8.4% on 09/04/2022. Glycemic trends remain in the 200's Continue Semglee, increase to 20 units Increase meal coverage to 5 units   GAD (generalized anxiety disorder) Continue Klonopin 0.5 mg every 12 hours as needed.   GERD (gastroesophageal reflux disease) Continue pantoprazole 40 mg daily. Antiacid, H2 blocker as needed.   Hypokalemia Replaced.  Resolved.   Suicidal thoughts: Patient has told staff that she has thoughts about hurting herself.   Environmental education officer ordered.  Psychiatry is consulted, not a suicidal risk Safety sitter discontinued. Psych signed off, Not suicidal.   Goals of care: Palliative Care is following -recs noted for SNF for rehab with palliative care service follow up -Appreciate input by GI, noted. Consideration for hospice. Would continue to address GOC  Afib -new diagnosis this visit -currently rate controlled -anticoagulation contraindicated given brain met   Subjective: No issues  Physical Exam: Vitals:   10/25/22 2102 10/26/22 0623 10/26/22 1351 10/26/22 1553  BP: (!) 160/66 (!) 153/86 (!) 159/76 (!) 153/58  Pulse: (!) 54 67 (!) 49 (!) 55  Resp: 18 18 20    Temp: 98.2 F (36.8 C) 97.7 F (36.5 C) 97.7 F (36.5 C)   TempSrc: Oral Oral Oral   SpO2: 98% 97% 98% 96%  Weight:  Height:       General exam: Laying in bed, in no acute distress Respiratory system: normal chest rise, clear, no audible wheezing Cardiovascular  system: regular rhythm, s1-s2 Gastrointestinal system: Nondistended, nontender, pos BS Central nervous system: No seizures, no tremors Extremities: No cyanosis, no joint deformities Skin: No rashes, no pallor Psychiatry:not currently conversant  Data Reviewed:  Labs reviewed: Na 135, K 3.9, Cr 0.53, WBC 12.9, Hgb 11.7, Plts 101  Family Communication: Pt in room, family not at bedside  Disposition: Status is: Inpatient Remains inpatient appropriate because: severity of illness  Planned Discharge Destination: Skilled nursing facility    Author: Rickey Barbara, MD 10/26/2022 5:54 PM  For on call review [email or web address].

## 2022-10-26 NOTE — Plan of Care (Signed)
Reinforce education. Discharge to skilled nursing facility.

## 2022-10-26 NOTE — Plan of Care (Signed)
  Problem: Education: Goal: Knowledge of General Education information will improve Description: Including pain rating scale, medication(s)/side effects and non-pharmacologic comfort measures Outcome: Progressing   Problem: Pain Managment: Goal: General experience of comfort will improve Outcome: Progressing   Problem: Safety: Goal: Ability to remain free from injury will improve Outcome: Progressing   

## 2022-10-27 DIAGNOSIS — R569 Unspecified convulsions: Secondary | ICD-10-CM | POA: Diagnosis not present

## 2022-10-27 DIAGNOSIS — C3492 Malignant neoplasm of unspecified part of left bronchus or lung: Secondary | ICD-10-CM | POA: Diagnosis not present

## 2022-10-27 LAB — GLUCOSE, CAPILLARY
Glucose-Capillary: 244 mg/dL — ABNORMAL HIGH (ref 70–99)
Glucose-Capillary: 236 mg/dL — ABNORMAL HIGH (ref 70–99)
Glucose-Capillary: 284 mg/dL — ABNORMAL HIGH (ref 70–99)
Glucose-Capillary: 259 mg/dL — ABNORMAL HIGH (ref 70–99)

## 2022-10-27 MED ORDER — INSULIN ASPART 100 UNIT/ML IJ SOLN
5.0000 [IU] | Freq: Three times a day (TID) | INTRAMUSCULAR | Status: DC
  Administered 2022-10-27 – 2022-11-04 (×19): 5 [IU] via SUBCUTANEOUS

## 2022-10-27 MED ORDER — INSULIN GLARGINE-YFGN 100 UNIT/ML ~~LOC~~ SOLN
20.0000 [IU] | Freq: Every day | SUBCUTANEOUS | Status: DC
  Administered 2022-10-28 – 2022-11-04 (×8): 20 [IU] via SUBCUTANEOUS
  Filled 2022-10-27 (×8): qty 0.2

## 2022-10-27 NOTE — Plan of Care (Signed)
Patient repeats words unable to articulate words most needs unknown states pain all over unable to say where and what type of pain. Patient total care. All meds taken PO patient not wanting to eat able to get ensure in with medication pass Problem: Education: Goal: Ability to describe self-care measures that may prevent or decrease complications (Diabetes Survival Skills Education) will improve Outcome: Not Progressing Goal: Individualized Educational Video(s) Outcome: Not Progressing   Problem: Coping: Goal: Ability to adjust to condition or change in health will improve Outcome: Not Progressing   Problem: Fluid Volume: Goal: Ability to maintain a balanced intake and output will improve Outcome: Not Progressing   Problem: Health Behavior/Discharge Planning: Goal: Ability to identify and utilize available resources and services will improve Outcome: Not Progressing Goal: Ability to manage health-related needs will improve Outcome: Not Progressing   Problem: Metabolic: Goal: Ability to maintain appropriate glucose levels will improve Outcome: Not Progressing   Problem: Nutritional: Goal: Maintenance of adequate nutrition will improve Outcome: Not Progressing Goal: Progress toward achieving an optimal weight will improve Outcome: Not Progressing   Problem: Skin Integrity: Goal: Risk for impaired skin integrity will decrease Outcome: Not Progressing   Problem: Tissue Perfusion: Goal: Adequacy of tissue perfusion will improve Outcome: Not Progressing   Problem: Education: Goal: Knowledge of General Education information will improve Description: Including pain rating scale, medication(s)/side effects and non-pharmacologic comfort measures Outcome: Not Progressing   Problem: Health Behavior/Discharge Planning: Goal: Ability to manage health-related needs will improve Outcome: Not Progressing   Problem: Clinical Measurements: Goal: Ability to maintain clinical measurements  within normal limits will improve Outcome: Not Progressing Goal: Will remain free from infection Outcome: Not Progressing Goal: Diagnostic test results will improve Outcome: Not Progressing Goal: Respiratory complications will improve Outcome: Not Progressing Goal: Cardiovascular complication will be avoided Outcome: Not Progressing   Problem: Activity: Goal: Risk for activity intolerance will decrease Outcome: Not Progressing   Problem: Nutrition: Goal: Adequate nutrition will be maintained Outcome: Not Progressing   Problem: Coping: Goal: Level of anxiety will decrease Outcome: Not Progressing   Problem: Elimination: Goal: Will not experience complications related to bowel motility Outcome: Not Progressing Goal: Will not experience complications related to urinary retention Outcome: Not Progressing   Problem: Pain Managment: Goal: General experience of comfort will improve Outcome: Not Progressing   Problem: Safety: Goal: Ability to remain free from injury will improve Outcome: Not Progressing   Problem: Skin Integrity: Goal: Risk for impaired skin integrity will decrease Outcome: Not Progressing

## 2022-10-27 NOTE — Progress Notes (Signed)
Progress Note   Patient: Carolyn Hooper ZOX:[id number] DOB: 12-16-1960 DOA: 10/13/2022     13 DOS: the patient was seen and examined on 10/27/2022   Brief hospital course: 62 yrs old female with medical history significant of hidradenitis suppurativa complicated by abscess, anxiety, type 2 diabetes, hyperinsulinemia, hypertension, gestational preeclampsia, auditory neuroma, history of pancreatitis, vertigo, seizures, history of lung cancer,  metastatic to brain who is brought to the emergency department by a relative after having seizures for the past 2 days. She describes her being unable to talk and fast blinking of her eyelids.  Patient also continued to complaining about abdominal pain from pancreatitis. Pertinent  labs include lipase 297, CT head showing progression of left frontal metastasis associated with vasogenic edema since 2 weeks ago.  Mild new mass effect on the left lateral ventricle but no midline shift or other complicating features.  Patient is admitted for further evaluation and started on Decadron as per Dr. Liana Gerold recommendation.   Assessment and Plan: Seizures: Likely secondary to metastatic brain lesion. 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 Case was discussed with neuro-oncology Dr. Barbaraann Cao. Continue dexamethasone 4 mg IVP every 6 hours. Continue Keppra 1000 mg p.o. twice daily. Seizure precautions /neurochecks. Dr. Idelle Leech discussed the case with Dr. Barbaraann Cao,  he recommended the patient can be discharged if she is doing better on Decadron and he will follow-up outpatient.   Abdominal pain secondary to acute pancreatitis: Cont analgesics as needed. Antiemetics as needed. Continue Pantoprazole 40 mg p.o. daily. Pancreatic enzymes before meals/snacks.   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 Continue fluconazole 800 mg p.o. daily per ID. Continue Levaquin 500 mg p.o. daily. ID following. CT with  findings of 28x65mm fluid collection between pancreatic body and stomach plus another 14x42mm fluid collection in the tail -Discussed with ID who initially considered diagnostic drain of fluid -Per IR, area not amenable to drainage. Discussed with GI for consideration for pseudocyst drain. Appreciate input by GI. Per GI, small new pseudocysts are not concerning for overt infection. Recs to hold off on endoscopic procedure at this time -Per ID, continue fluconazole 800mg  qday and levaquin 500mg  daily till ID visit on 9/3 -If goals of care later align with comfort care, then would stop abx   Type 2 diabetes with hyperglycemia: Last HbA1c was 8.4% on 09/04/2022. Continue Semglee, increase to 20 units Increase meal coverage to 5 units   GAD (generalized anxiety disorder) Continue Klonopin 0.5 mg every 12 hours as needed.   GERD (gastroesophageal reflux disease) Continue pantoprazole 40 mg daily. Antiacid, H2 blocker as needed.   Hypokalemia Replaced.  Resolved.   Suicidal thoughts: Patient has told staff that she has thoughts about hurting herself.   Environmental education officer ordered.  Psychiatry is consulted, not a suicidal risk Safety sitter discontinued. Psych signed off, Not suicidal.   Goals of care: Palliative Care is following -recs noted for SNF for rehab with palliative care service follow up -Appreciate input by GI, noted. Consideration for hospice. Would continue to address GOC  Afib -new diagnosis this visit -currently rate controlled -anticoagulation contraindicated given brain met   Subjective: Without issues  Physical Exam: Vitals:   10/26/22 1553 10/26/22 1950 10/27/22 0418 10/27/22 1117  BP: (!) 153/58 (!) 175/70 (!) 161/78 (!) 153/65  Pulse: (!) 55 63 63 (!) 54  Resp:  18 18 17   Temp:  97.6 F (36.4 C) (!) 97.5 F (36.4 C) 97.9 F (36.6 C)  TempSrc:  Oral Oral Oral  SpO2: 96% 99% 96% 97%  Weight:      Height:       General exam: Laying in bed, in  nad Respiratory system: Normal respiratory effort, no audible wheezing Cardiovascular system: regular rate, s1, s2 Gastrointestinal system: Soft, nondistended, positive BS Central nervous system: CN2-12 grossly intact, strength intact Extremities: Perfused, no clubbing Skin: Normal skin turgor, no notable skin lesions seen Psychiatry: unable to assess given mentation  Data Reviewed:  There are no new results to review at this time.  Family Communication: Pt in room, family not at bedside  Disposition: Status is: Inpatient Remains inpatient appropriate because: severity of illness  Planned Discharge Destination: Skilled nursing facility    Author: Rickey Barbara, MD 10/27/2022 4:55 PM  For on call review [email or web address].

## 2022-10-27 NOTE — Progress Notes (Signed)
ID Brief Note #Infected pseudocyst/abscess  -If the result of GOC discussion is life-prolonging measures with limits of DNR per palliative note on 8/8 then would continue antibiotics with fluconazole 800 mg every day(qtc on 8/4 391) and levaquin 500mg  pO every day till ID visit on 9/3 -If goals of care discussion results in pursuing comfort care then would follow protocol of stopping antibiotics.  ID will sign off

## 2022-10-28 ENCOUNTER — Telehealth: Payer: Self-pay

## 2022-10-28 DIAGNOSIS — C3492 Malignant neoplasm of unspecified part of left bronchus or lung: Secondary | ICD-10-CM | POA: Diagnosis not present

## 2022-10-28 DIAGNOSIS — R569 Unspecified convulsions: Secondary | ICD-10-CM | POA: Diagnosis not present

## 2022-10-28 LAB — GLUCOSE, CAPILLARY
Glucose-Capillary: 252 mg/dL — ABNORMAL HIGH (ref 70–99)
Glucose-Capillary: 241 mg/dL — ABNORMAL HIGH (ref 70–99)
Glucose-Capillary: 158 mg/dL — ABNORMAL HIGH (ref 70–99)
Glucose-Capillary: 116 mg/dL — ABNORMAL HIGH (ref 70–99)

## 2022-10-28 NOTE — Progress Notes (Signed)
Occupational Therapy Treatment Patient Details Name: Carolyn Hooper MRN: [id number] DOB: [birth year] Today's Date: 10/28/2022   History of present illness Pt is a 62 y/o female presenting to ED on 10/13/2022 due to reports of seizure like activity with fluttering eye lids and difficulty in speech. Pt indicates 2 seizures in 2 days, fall and poor PO intake with N and V.  Pt d/c from hospital following admission 7/20-7/23/2024 in which CT revealing episode of acute pancreatitis with new small fluid collection and found to be hypokalemic. Pt seen by GI and recommendation for conservative measures. Pt provided with IV dilaudid and pain improved.  Pt hospitalized 7/6-7/15/2024 with abdominal pain and hospitalized 6/19-6/27 with epigastric pain. CT abdomen revealed enhancing fluid collection in stomach. S/p cystogastrostomy on 7/11. MRI of brain on 09/29/2022 reveals stable size of 15 x 13 x 14 mm peripherally enhancing metastasis in the posterior left frontal lobe, stable punctate enhancing metastasis in the medial right frontal lobe. Pt PMH includes but is not limited to:  non-small cell lung CA, brain metastasis, focal seizures, DM and pt currently on immunotherapy/chemotherapy.   OT comments  Patient was noted to have had a significant decline with +2 for standing in STEDY with increased fear of falling. Patient noted to have increased difficulty with communication (word finding) compared to evaluation. Patient's family unable to support patient at current physical assist level at home. Patient will benefit from continued inpatient follow up therapy, <3 hours/day       If plan is discharge home, recommend the following:  A little help with walking and/or transfers;A little help with bathing/dressing/bathroom;Assistance with cooking/housework;Help with stairs or ramp for entrance;Direct supervision/assist for medications management;Direct supervision/assist for financial management;Assist for transportation    Equipment Recommendations  None recommended by OT       Precautions / Restrictions Precautions Precautions: Fall Restrictions Weight Bearing Restrictions: No       Mobility Bed Mobility Overal bed mobility: Needs Assistance Bed Mobility: Sidelying to Sit, Sit to Supine   Sidelying to sit: HOB elevated, Max assist, Used rails     Sit to sidelying: Total assist, +2 for physical assistance, +2 for safety/equipment General bed mobility comments: with trunk to guide back into bed with increase fear of falling with transitions       Balance Overall balance assessment: Needs assistance, History of Falls Sitting-balance support: Feet supported Sitting balance-Leahy Scale: Fair     Standing balance support: During functional activity, Reliant on assistive device for balance, Bilateral upper extremity supported Standing balance-Leahy Scale: Zero     ADL either performed or assessed with clinical judgement   ADL Overall ADL's : Needs assistance/impaired       General ADL Comments: patient expressed increased fear of falling with sitting EOB. PT, OT and rehab tech in room with STEDY with increased education on how to use it. patient noted to be anxious with standing in stedy but able to complete with max A x2 and third to manipulate seat behind patient. patient ntoed to have tremor like movement in LUE with holding rail. patient when returned to bed with +2 asking for anti nausea meds and dilaudid. nurse made aware. nurse walking into room upon therapy leaving.      Cognition Arousal: Alert Behavior During Therapy: WFL for tasks assessed/performed, Anxious Overall Cognitive Status: Impaired/Different from baseline         Following Commands: Follows one step commands with increased time     Problem Solving: Slow processing General Comments:  patient with delayed responses. patient having difficulty finding words and has word salad at times during session. still very  apprechiative of all help                   Pertinent Vitals/ Pain       Pain Assessment Pain Assessment: Faces Faces Pain Scale: Hurts little more Pain Location: perineal area with adjustment of pure wic Pain Descriptors / Indicators: Grimacing Pain Intervention(s): Limited activity within patient's tolerance, Monitored during session, Patient requesting pain meds-RN notified, Repositioned         Frequency  Min 1X/week        Progress Toward Goals  OT Goals(current goals can now be found in the care plan section)  Progress towards OT goals: Not progressing toward goals - comment (patient had a significant change in status)     Plan      Co-evaluation    PT/OT/SLP Co-Evaluation/Treatment: Yes Reason for Co-Treatment: To address functional/ADL transfers;For patient/therapist safety PT goals addressed during session: Mobility/safety with mobility OT goals addressed during session: ADL's and self-care      AM-PAC OT "6 Clicks" Daily Activity     Outcome Measure   Help from another person eating meals?: A Little Help from another person taking care of personal grooming?: A Little Help from another person toileting, which includes using toliet, bedpan, or urinal?: Total Help from another person bathing (including washing, rinsing, drying)?: Total Help from another person to put on and taking off regular upper body clothing?: A Little Help from another person to put on and taking off regular lower body clothing?: A Lot 6 Click Score: 13    End of Session Equipment Utilized During Treatment: Gait belt;Rolling walker (2 wheels)  OT Visit Diagnosis: Unsteadiness on feet (R26.81);Other abnormalities of gait and mobility (R26.89);Muscle weakness (generalized) (M62.81)   Activity Tolerance Patient tolerated treatment well   Patient Left in bed;with call bell/phone within reach;with bed alarm set   Nurse Communication Mobility status;Patient requests pain meds         Time: 2536-6440 OT Time Calculation (min): 29 min  Charges: OT General Charges $OT Visit: 1 Visit OT Treatments $Therapeutic Activity: 8-22 mins  Rosalio Loud, MS Acute Rehabilitation Department Office# [phone]   Selinda Flavin 10/28/2022, 4:44 PM

## 2022-10-28 NOTE — Telephone Encounter (Signed)
Appt has been moved to 12/12/22 at 8 am with GM  Spoke with family member and notified of change and new instructions also sent to My Chart.  Pt will also be advised at discharge.

## 2022-10-28 NOTE — Plan of Care (Signed)
Problem: Education: Goal: Ability to describe self-care measures that may prevent or decrease complications (Diabetes Survival Skills Education) will improve 10/28/2022 1106 by Aretta Nip, RN Outcome: Not Progressing 10/28/2022 1105 by Aretta Nip, RN Outcome: Not Progressing Goal: Individualized Educational Video(s) 10/28/2022 1106 by Aretta Nip, RN Outcome: Not Progressing 10/28/2022 1105 by Aretta Nip, RN Outcome: Not Progressing   Problem: Coping: Goal: Ability to adjust to condition or change in health will improve 10/28/2022 1106 by Aretta Nip, RN Outcome: Not Progressing 10/28/2022 1105 by Aretta Nip, RN Outcome: Not Progressing   Problem: Fluid Volume: Goal: Ability to maintain a balanced intake and output will improve 10/28/2022 1106 by Aretta Nip, RN Outcome: Progressing 10/28/2022 1105 by Aretta Nip, RN Outcome: Progressing   Problem: Health Behavior/Discharge Planning: Goal: Ability to identify and utilize available resources and services will improve 10/28/2022 1106 by Aretta Nip, RN Outcome: Not Progressing 10/28/2022 1105 by Aretta Nip, RN Outcome: Not Progressing Goal: Ability to manage health-related needs will improve 10/28/2022 1106 by Aretta Nip, RN Outcome: Not Progressing 10/28/2022 1105 by Aretta Nip, RN Outcome: Not Progressing   Problem: Metabolic: Goal: Ability to maintain appropriate glucose levels will improve 10/28/2022 1106 by Aretta Nip, RN Outcome: Not Progressing Note: Insulin increased 10/27/22 10/28/2022 1105 by Aretta Nip, RN Outcome: Not Progressing Note: Insulin increased yesterday    Problem: Nutritional: Goal: Maintenance of adequate nutrition will improve 10/28/2022 1106 by Aretta Nip, RN Outcome: Progressing Note: Patient refusing to eat only drinking ensure 10/28/2022 1105 by Aretta Nip, RN Outcome: Not Progressing Note: Patient refusing meals only drinking  ensure  Goal: Progress toward achieving an optimal weight will improve 10/28/2022 1106 by Aretta Nip, RN Outcome: Progressing 10/28/2022 1105 by Aretta Nip, RN Outcome: Not Progressing   Problem: Skin Integrity: Goal: Risk for impaired skin integrity will decrease 10/28/2022 1106 by Aretta Nip, RN Outcome: Progressing 10/28/2022 1105 by Aretta Nip, RN Outcome: Not Progressing   Problem: Tissue Perfusion: Goal: Adequacy of tissue perfusion will improve 10/28/2022 1106 by Aretta Nip, RN Outcome: Progressing 10/28/2022 1105 by Aretta Nip, RN Outcome: Progressing   Problem: Education: Goal: Knowledge of General Education information will improve Description: Including pain rating scale, medication(s)/side effects and non-pharmacologic comfort measures 10/28/2022 1106 by Aretta Nip, RN Outcome: Progressing 10/28/2022 1105 by Aretta Nip, RN Outcome: Not Progressing   Problem: Health Behavior/Discharge Planning: Goal: Ability to manage health-related needs will improve 10/28/2022 1106 by Aretta Nip, RN Outcome: Progressing 10/28/2022 1105 by Aretta Nip, RN Outcome: Not Progressing   Problem: Clinical Measurements: Goal: Ability to maintain clinical measurements within normal limits will improve 10/28/2022 1106 by Aretta Nip, RN Outcome: Progressing 10/28/2022 1105 by Aretta Nip, RN Outcome: Not Progressing Goal: Will remain free from infection 10/28/2022 1106 by Aretta Nip, RN Outcome: Progressing 10/28/2022 1105 by Aretta Nip, RN Outcome: Not Progressing Goal: Diagnostic test results will improve 10/28/2022 1106 by Aretta Nip, RN Outcome: Progressing 10/28/2022 1105 by Aretta Nip, RN Outcome: Not Progressing Goal: Respiratory complications will improve 10/28/2022 1106 by Aretta Nip, RN Outcome: Progressing 10/28/2022 1105 by Aretta Nip, RN Outcome: Not Progressing Goal: Cardiovascular  complication will be avoided 10/28/2022 1106 by Aretta Nip, RN Outcome: Progressing 10/28/2022 1105 by Aretta Nip, RN Outcome: Not Progressing   Problem: Activity: Goal: Risk for activity intolerance will decrease  10/28/2022 1106 by Aretta Nip, RN Outcome: Progressing 10/28/2022 1105 by Aretta Nip, RN Outcome: Not Progressing   Problem: Nutrition: Goal: Adequate nutrition will be maintained 10/28/2022 1106 by Aretta Nip, RN Outcome: Not Progressing Note: Refusing to eat 10/28/2022 1105 by Aretta Nip, RN Outcome: Not Progressing   Problem: Coping: Goal: Level of anxiety will decrease 10/28/2022 1106 by Aretta Nip, RN Outcome: Progressing 10/28/2022 1105 by Aretta Nip, RN Outcome: Not Progressing   Problem: Elimination: Goal: Will not experience complications related to bowel motility 10/28/2022 1106 by Aretta Nip, RN Outcome: Not Progressing Note: No BM with senna and colace 10/28/2022 1105 by Aretta Nip, RN Outcome: Not Progressing Goal: Will not experience complications related to urinary retention 10/28/2022 1106 by Aretta Nip, RN Outcome: Progressing 10/28/2022 1105 by Aretta Nip, RN Outcome: Not Progressing   Problem: Pain Managment: Goal: General experience of comfort will improve 10/28/2022 1106 by Aretta Nip, RN Outcome: Not Progressing Note: Constant pain 10/28/2022 1105 by Aretta Nip, RN Outcome: Not Progressing   Problem: Safety: Goal: Ability to remain free from injury will improve 10/28/2022 1106 by Aretta Nip, RN Outcome: Not Progressing Note: weakness 10/28/2022 1105 by Aretta Nip, RN Outcome: Not Progressing   Problem: Skin Integrity: Goal: Risk for impaired skin integrity will decrease 10/28/2022 1106 by Aretta Nip, RN Outcome: Progressing 10/28/2022 1105 by Aretta Nip, RN Outcome: Not Progressing

## 2022-10-28 NOTE — Progress Notes (Signed)
Progress Note   Patient: Carolyn Hooper NGE:[id number] DOB: [birth year] DOA: 10/13/2022     14 DOS: the patient was seen and examined on 10/28/2022   Brief hospital course: 62 yrs old female with medical history significant of hidradenitis suppurativa complicated by abscess, anxiety, type 2 diabetes, hyperinsulinemia, hypertension, gestational preeclampsia, auditory neuroma, history of pancreatitis, vertigo, seizures, history of lung cancer,  metastatic to brain who is brought to the emergency department by a relative after having seizures for the past 2 days. She describes her being unable to talk and fast blinking of her eyelids.  Patient also continued to complaining about abdominal pain from pancreatitis. Pertinent  labs include lipase 297, CT head showing progression of left frontal metastasis associated with vasogenic edema since 2 weeks ago.  Mild new mass effect on the left lateral ventricle but no midline shift or other complicating features.  Patient is admitted for further evaluation and started on Decadron as per Dr. Liana Gerold recommendation.   Assessment and Plan: Seizures: Likely secondary to metastatic brain lesion. 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 Case was discussed with neuro-oncology Dr. Barbaraann Cao. Continue dexamethasone 4 mg IVP every 6 hours. Continue Keppra 1000 mg p.o. twice daily. Seizure precautions /neurochecks. Dr. Idelle Leech discussed the case with Dr. Barbaraann Cao,  he recommended the patient can be discharged if she is doing better on Decadron and he will follow-up outpatient.   Abdominal pain secondary to acute pancreatitis: Cont analgesics as needed. Antiemetics as needed. Continue Pantoprazole 40 mg p.o. daily. Pancreatic enzymes before meals/snacks.   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 Continue fluconazole 800 mg p.o. daily per ID. Continue Levaquin 500 mg p.o. daily. ID following. CT with  findings of 28x60mm fluid collection between pancreatic body and stomach plus another 14x45mm fluid collection in the tail -Discussed with ID who initially considered diagnostic drain of fluid -Per IR, area not amenable to drainage. Discussed with GI for consideration for pseudocyst drain. Appreciate input by GI. Per GI, small new pseudocysts are not concerning for overt infection. Recs to hold off on endoscopic procedure at this time -Per ID, continue fluconazole 800mg  qday and levaquin 500mg  daily till ID visit on 9/3 -If goals of care later align with comfort care, then would stop abx at that time   Type 2 diabetes with hyperglycemia: Last HbA1c was 8.4% on 09/04/2022. Continue Semglee, increase to 20 units and 5 units meal coverage   GAD (generalized anxiety disorder) Continue Klonopin 0.5 mg every 12 hours as needed.   GERD (gastroesophageal reflux disease) Continue pantoprazole 40 mg daily. Antiacid, H2 blocker as needed.   Hypokalemia Replaced.  Resolved.   Suicidal thoughts: Patient has told staff that she has thoughts about hurting herself.   Environmental education officer ordered.  Psychiatry is consulted, not a suicidal risk Safety sitter discontinued. Psych signed off, Not suicidal.   Goals of care: Palliative Care is following -recs noted for SNF for rehab with palliative care service follow up -Appreciate input by GI, noted. Consideration for hospice. Would continue to address GOC  Afib -new diagnosis this visit -currently rate controlled -anticoagulation contraindicated given brain met   Subjective: Asking to close the blinds  Physical Exam: Vitals:   10/28/22 0329 10/28/22 1038 10/28/22 1340 10/28/22 1531  BP: (!) 164/86 (!) 151/71 (!) 156/70 (!) 145/80  Pulse: 74 (!) 56 64 73  Resp:  16 16 18   Temp:  97.7 F (36.5 C) 97.7 F (36.5 C) 98.6 F (  37 C)  TempSrc:  Oral Oral Oral  SpO2: 99% 96% 96% 95%  Weight:      Height:       General exam: Conversant, in no  acute distress Respiratory system: normal chest rise, clear, no audible wheezing Cardiovascular system: regular rhythm, s1-s2 Gastrointestinal system: Nondistended, nontender, pos BS Central nervous system: No seizures, no tremors Extremities: No cyanosis, no joint deformities Skin: No rashes, no pallor Psychiatry: mood seems normal, affect seems normal   Data Reviewed:  There are no new results to review at this time.  Family Communication: Pt in room, family not at bedside  Disposition: Status is: Inpatient Remains inpatient appropriate because: severity of illness  Planned Discharge Destination: Skilled nursing facility    Author: Rickey Barbara, MD 10/28/2022 5:46 PM  For on call review [email or web address].

## 2022-10-28 NOTE — TOC Progression Note (Addendum)
Transition of Care [facility]) - Progression Note    Patient Details  Name: Carolyn Hooper MRN: [id number] Date of Birth: 12-12-1960  Transition of Care [facility]) CM/SW Contact  [name], RN Phone Number: 10/28/2022, 12:16 PM  Clinical Narrative:  Call received from pt's son, Jomarie Longs, request short term rehab facility change to [facility]. Kia with [facility] notified of change of facility. Call to [facility], admissions rep with [facility], to request initiation of auth with insurance, [facility] sent text she will call me back  -3:07pm Call to main facility, transferred to [facility] in admissions, vm left informing pt's family has accepted bed offer, request for initiation of [employer] authorization, left NCM name and phone number.   -3:15pm Call received from Whitney with [facility], requesting [facility] and hospital documents (H&P, therapy notes ) be emailed or faxed to [email or web address] or fax [phone], documents will be reviewed for bed offer acceptance and availability.          Expected Discharge Plan and Services                                               Social Determinants of Health (SDOH) Interventions SDOH Screenings   Food Insecurity: No Food Insecurity (10/14/2022)  Housing: Low Risk  (10/14/2022)  Transportation Needs: No Transportation Needs (10/14/2022)  Utilities: Not At Risk (10/14/2022)  Depression (PHQ2-9): Medium Risk (07/12/2022)  Tobacco Use: Medium Risk (10/14/2022)    Readmission Risk Interventions    10/08/2022    4:55 PM 09/24/2022    3:55 PM 09/07/2022    2:44 PM  Readmission Risk Prevention Plan  Transportation Screening Complete Complete Complete  Medication Review Oceanographer) Complete Complete Complete  PCP or Specialist appointment within 3-5 days of discharge Complete Complete Complete  HRI or Home Care Consult Complete Complete Complete  SW Recovery Care/Counseling Consult Complete Complete Complete   Palliative Care Screening Not Applicable Not Applicable Not Applicable  Skilled Nursing Facility Not Applicable Not Applicable Not Applicable

## 2022-10-28 NOTE — Progress Notes (Signed)
1645 Patient has refused to eat today intake was 1 apple sauce and 1 ensure and water

## 2022-10-28 NOTE — Progress Notes (Signed)
Physical Therapy Treatment Patient Details Name: Carolyn Hooper MRN: [id number] DOB: Mar 28, 1960 Today's Date: 10/28/2022   History of Present Illness Pt is a 62 y/o female presenting to ED on 10/13/2022 due to reports of seizure like activity with fluttering eye lids and difficulty in speech. Pt indicates 2 seizures in 2 days, fall and poor PO intake with N and V.  Pt d/c from hospital following admission 7/20-7/23/2024 in which CT revealing episode of acute pancreatitis with new small fluid collection and found to be hypokalemic. Pt seen by GI and recommendation for conservative measures. Pt provided with IV dilaudid and pain improved.  Pt hospitalized 7/6-7/15/2024 with abdominal pain and hospitalized 6/19-6/27 with epigastric pain. CT abdomen revealed enhancing fluid collection in stomach. S/p cystogastrostomy on 7/11. MRI of brain on 09/29/2022 reveals stable size of 15 x 13 x 14 mm peripherally enhancing metastasis in the posterior left frontal lobe, stable punctate enhancing metastasis in the medial right frontal lobe. Pt PMH includes but is not limited to:  non-small cell lung CA, brain metastasis, focal seizures, DM and pt currently on immunotherapy/chemotherapy.    PT Comments  Pt with significant decline in mobility level-goals adjusted.  RN reports not eating or drinking well.  She required max x 2 for all transfers and only tolerated standing in STEDY for < 1 min (sitting on pads).  Pt requiring increased time and encouragement for all transfers for reassurance (fear of falling) and for communication. Pt having increased difficulty with word finding and word salad.     If plan is discharge home, recommend the following: Two people to help with walking and/or transfers;Two people to help with bathing/dressing/bathroom   Can travel by private vehicle     No  Equipment Recommendations  None recommended by PT    Recommendations for Other Services       Precautions / Restrictions  Precautions Precautions: Fall Restrictions Weight Bearing Restrictions: No     Mobility  Bed Mobility Overal bed mobility: Needs Assistance Bed Mobility: Sidelying to Sit, Sit to Supine   Sidelying to sit: HOB elevated, Max assist, Used rails     Sit to sidelying: Total assist, +2 for physical assistance, +2 for safety/equipment General bed mobility comments: Increased time and max A for trunk to sit; To return to supine total A x 2 with trunk and legs to guide back into bed due to fear of falling    Transfers Overall transfer level: Needs assistance Equipment used: Rolling walker (2 wheels) Transfers: Sit to/from Stand Sit to Stand: Max assist, +2 physical assistance           General transfer comment: RN reports tried standing earlier and pt buckling.  Utilized STEDY and pt max x 2 to stand in STEDY, repositioned toward HOB, and max x 2 to stand from [facility].  Pt very fearful of falling, needed increased time and reassurement, only tolreating in STEDY enough to reposition Transfer via Lift Equipment: Stedy  Ambulation/Gait                   Stairs             Wheelchair Mobility     Tilt Bed    Modified Rankin (Stroke Patients Only)       Balance Overall balance assessment: Needs assistance, History of Falls Sitting-balance support: Feet supported, No upper extremity supported Sitting balance-Leahy Scale: Fair Did dangle EOB at least 15 mins     Standing balance support: During functional  activity, Reliant on assistive device for balance, Bilateral upper extremity supported Standing balance-Leahy Scale: Zero Standing balance comment: Max x 2 to stand into STEDY; tolerating <1 min on STEDY pads                            Cognition Arousal: Alert Behavior During Therapy: Anxious, Flat affect Overall Cognitive Status: Impaired/Different from baseline                                 General Comments: Patient with  significant delayed responses. Patient having significant difficulty finding words and has word salad at times during session. Pt still very appreciative of all help        Exercises      General Comments        Pertinent Vitals/Pain Pain Assessment Pain Assessment: Faces Faces Pain Scale: Hurts little more Pain Location: perineal area with adjustment of pure wic Pain Descriptors / Indicators: Grimacing Pain Intervention(s): Limited activity within patient's tolerance, Monitored during session, Patient requesting pain meds-RN notified, Premedicated before session, Repositioned    Home Living                          Prior Function            PT Goals (current goals can now be found in the care plan section) Acute Rehab PT Goals Patient Stated Goal: not fall PT Goal Formulation: With patient Time For Goal Achievement: 11/11/22 Potential to Achieve Goals: Poor Progress towards PT goals: Not progressing toward goals - comment (Pt with significant decline. Increased confusion, increased difficulty finding words, decreased mobility.  RN reports pt not eating or drinking much.)    Frequency    Min 1X/week      PT Plan Current plan remains appropriate    Co-evaluation PT/OT/SLP Co-Evaluation/Treatment: Yes Reason for Co-Treatment: Complexity of the patient's impairments (multi-system involvement);For patient/therapist safety PT goals addressed during session: Mobility/safety with mobility OT goals addressed during session: ADL's and self-care      AM-PAC PT "6 Clicks" Mobility   Outcome Measure  Help needed turning from your back to your side while in a flat bed without using bedrails?: A Lot Help needed moving from lying on your back to sitting on the side of a flat bed without using bedrails?: A Lot Help needed moving to and from a bed to a chair (including a wheelchair)?: Total Help needed standing up from a chair using your arms (e.g., wheelchair or  bedside chair)?: Total Help needed to walk in hospital room?: Total Help needed climbing 3-5 steps with a railing? : Total 6 Click Score: 8    End of Session Equipment Utilized During Treatment: Gait belt Activity Tolerance: Patient limited by pain (cognition , and anxiety) Patient left: in bed;with call bell/phone within reach;with bed alarm set Nurse Communication: Mobility status;Patient requests pain meds PT Visit Diagnosis: Unsteadiness on feet (R26.81);Muscle weakness (generalized) (M62.81);Difficulty in walking, not elsewhere classified (R26.2)     Time: 3329-5188 PT Time Calculation (min) (ACUTE ONLY): 39 min  Charges:    $Therapeutic Activity: 23-37 mins PT General Charges $$ ACUTE PT VISIT: 1 Visit                     Anise Salvo, PT Acute Rehab Services [facility] [phone]    Carolyn Hooper   10/28/2022, 5:43 PM

## 2022-10-28 NOTE — Telephone Encounter (Signed)
-----   Message from [facility] sent at 10/26/2022  2:43 PM EDT ----- Regarding: Change date of date for EGD , This patient is doing poorly. I'm not sure what overall GOC are and hospice may be coming into the picture. Please move her repeat EGD with stent removal date out 4-6 weeks from what is scheduled so we can find out what happens between now and then. Thanks. GM

## 2022-10-29 ENCOUNTER — Inpatient Hospital Stay: Payer: BC Managed Care – PPO | Admitting: Internal Medicine

## 2022-10-29 ENCOUNTER — Other Ambulatory Visit: Payer: Self-pay

## 2022-10-29 DIAGNOSIS — C3490 Malignant neoplasm of unspecified part of unspecified bronchus or lung: Secondary | ICD-10-CM | POA: Diagnosis not present

## 2022-10-29 DIAGNOSIS — R569 Unspecified convulsions: Secondary | ICD-10-CM | POA: Diagnosis not present

## 2022-10-29 DIAGNOSIS — C3492 Malignant neoplasm of unspecified part of left bronchus or lung: Secondary | ICD-10-CM | POA: Diagnosis not present

## 2022-10-29 LAB — GLUCOSE, CAPILLARY
Glucose-Capillary: 162 mg/dL — ABNORMAL HIGH (ref 70–99)
Glucose-Capillary: 190 mg/dL — ABNORMAL HIGH (ref 70–99)
Glucose-Capillary: 142 mg/dL — ABNORMAL HIGH (ref 70–99)
Glucose-Capillary: 131 mg/dL — ABNORMAL HIGH (ref 70–99)

## 2022-10-29 NOTE — Progress Notes (Addendum)
Ms. Bottaro fell asleep at 1455. I checked her again at 1525 and tried to wake her up, but she slept deeply. I checked her blood sugar at 1533 and was 142. I checked her Vital sign and it is normal. Pt's mom and sister came to visit her. I attempted to wake her up again to talk to her family. Pt answered me, " no." Mom and sister told me, " it's normal for her to sleep deeply." I will keep checking on her.   Update: She is awake and alerted and kept talking again.

## 2022-10-29 NOTE — Plan of Care (Signed)
  Problem: Education: Goal: Ability to describe self-care measures that may prevent or decrease complications (Diabetes Survival Skills Education) will improve Outcome: Progressing   Problem: Fluid Volume: Goal: Ability to maintain a balanced intake and output will improve Outcome: Progressing   Problem: Metabolic: Goal: Ability to maintain appropriate glucose levels will improve Outcome: Progressing   Problem: Nutritional: Goal: Maintenance of adequate nutrition will improve Outcome: Progressing   Problem: Skin Integrity: Goal: Risk for impaired skin integrity will decrease Outcome: Progressing   Problem: Education: Goal: Knowledge of General Education information will improve Description: Including pain rating scale, medication(s)/side effects and non-pharmacologic comfort measures Outcome: Progressing   Problem: Nutrition: Goal: Adequate nutrition will be maintained Outcome: Progressing   Problem: Coping: Goal: Level of anxiety will decrease Outcome: Progressing   Problem: Elimination: Goal: Will not experience complications related to bowel motility Outcome: Progressing   Problem: Skin Integrity: Goal: Risk for impaired skin integrity will decrease Outcome: Progressing   Problem: Coping: Goal: Ability to adjust to condition or change in health will improve Outcome: Not Progressing   Problem: Health Behavior/Discharge Planning: Goal: Ability to manage health-related needs will improve Outcome: Not Progressing   Problem: Activity: Goal: Risk for activity intolerance will decrease Outcome: Not Progressing

## 2022-10-29 NOTE — Progress Notes (Signed)
Carolyn Hooper is keeping taking her gown off and kicking her blanket off. She also continues keeping talking and doesn't make any sense.

## 2022-10-29 NOTE — Progress Notes (Signed)
Progress Note   Patient: Carolyn Hooper NUU:[id number] DOB: Aug 04, 1960 DOA: 10/13/2022     15 DOS: the patient was seen and examined on 10/29/2022   Brief hospital course: 62 yrs old female with medical history significant of hidradenitis suppurativa complicated by abscess, anxiety, type 2 diabetes, hyperinsulinemia, hypertension, gestational preeclampsia, auditory neuroma, history of pancreatitis, vertigo, seizures, history of lung cancer,  metastatic to brain who is brought to the emergency department by a relative after having seizures for the past 2 days. She describes her being unable to talk and fast blinking of her eyelids.  Patient also continued to complaining about abdominal pain from pancreatitis. Pertinent  labs include lipase 297, CT head showing progression of left frontal metastasis associated with vasogenic edema since 2 weeks ago.  Mild new mass effect on the left lateral ventricle but no midline shift or other complicating features.  Patient is admitted for further evaluation and started on Decadron as per Dr. Liana Gerold recommendation.   Assessment and Plan: Seizures: Likely secondary to metastatic brain lesion. 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 Case was discussed with neuro-oncology Dr. Barbaraann Cao. Continue dexamethasone 4 mg IVP every 6 hours. Continue Keppra 1000 mg p.o. twice daily. Seizure precautions /neurochecks. Dr. Idelle Leech discussed the case with Dr. Barbaraann Cao,  he recommended the patient can be discharged if she is doing better on Decadron and he will follow-up outpatient.   Abdominal pain secondary to acute pancreatitis: Cont analgesics as needed. Antiemetics as needed. Continue Pantoprazole 40 mg p.o. daily. Pancreatic enzymes before meals/snacks.   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 ID had been following, later signed off. CT with findings of 28x66mm fluid collection between pancreatic body and  stomach plus another 14x78mm fluid collection in the tail -Discussed with ID who initially considered diagnostic drain of fluid -Per IR, area not amenable to drainage. Discussed with GI for consideration for pseudocyst drain. Appreciate input by GI. Per GI, small new pseudocysts are not concerning for overt infection. Recs to hold off on endoscopic procedure at this time -Per ID, continue fluconazole 800mg  qday and levaquin 500mg  daily till ID visit on 9/3   Type 2 diabetes with hyperglycemia: Last HbA1c was 8.4% on 09/04/2022. Continue Semglee, increase to 20 units and 5 units meal coverage -glycemic trends stable   GAD (generalized anxiety disorder) Continue Klonopin 0.5 mg every 12 hours as needed.   GERD (gastroesophageal reflux disease) Continue pantoprazole 40 mg daily. Antiacid, H2 blocker as needed.   Hypokalemia Replaced.  Resolved.   Suicidal thoughts: Patient has told staff that she has thoughts about hurting herself.   Psychiatry was consulted and found to be not a suicidal risk, psych signed off   Goals of care: Palliative Care is following -recs noted for SNF for rehab with palliative care service follow up  Afib -new diagnosis this visit -currently rate controlled -anticoagulation contraindicated given brain met   Subjective: Asking to turn over in bed. RN at bedside  Physical Exam: Vitals:   10/28/22 2016 10/29/22 0316 10/29/22 1121 10/29/22 1535  BP: 122/71 (!) 167/90 (!) 154/69 138/65  Pulse: 71 85 65 69  Resp: 20  17 18   Temp: (!) 97.5 F (36.4 C) 97.8 F (36.6 C) 97.9 F (36.6 C) 97.7 F (36.5 C)  TempSrc: Oral Oral Oral Oral  SpO2: 97% 96% 97% 96%  Weight:      Height:       General exam: Awake, laying in bed, in nad  Respiratory system: Normal respiratory effort, no wheezing Cardiovascular system: regular rate, s1, s2 Gastrointestinal system: Soft, nondistended, positive BS Central nervous system: CN2-12 grossly intact, strength  intact Extremities: Perfused, no clubbing Skin: Normal skin turgor, no notable skin lesions seen Psychiatry: unable to assess given mentation   Data Reviewed:  There are no new results to review at this time.  Family Communication: Pt in room, family not at bedside  Disposition: Status is: Inpatient Remains inpatient appropriate because: severity of illness  Planned Discharge Destination: Skilled nursing facility    Author: Rickey Barbara, MD 10/29/2022 5:22 PM  For on call review [email or web address].

## 2022-10-29 NOTE — TOC Progression Note (Signed)
Transition of Care [facility]) - Progression Note    Patient Details  Name: Carolyn Hooper MRN: [id number] Date of Birth: January 09, 1961  [facility] CM/SW Contact  [name], RN Phone Number: 10/29/2022, 3:13 PM  Clinical Narrative:   unable to contact [facility], facility not answering main phone, email sent to facility  at [email or web address] requesting call back for status of bed acceptance. NCM will continue to call facility.          Expected Discharge Plan and Services                                               Social Determinants of Health (SDOH) Interventions SDOH Screenings   Food Insecurity: No Food Insecurity (10/14/2022)  Housing: Low Risk  (10/14/2022)  Transportation Needs: No Transportation Needs (10/14/2022)  Utilities: Not At Risk (10/14/2022)  Depression (PHQ2-9): Medium Risk (07/12/2022)  Tobacco Use: Medium Risk (10/14/2022)    Readmission Risk Interventions    10/08/2022    4:55 PM 09/24/2022    3:55 PM 09/07/2022    2:44 PM  Readmission Risk Prevention Plan  Transportation Screening Complete Complete Complete  Medication Review [employer]) Complete Complete Complete  PCP or Specialist appointment within 3-5 days of discharge Complete Complete Complete  HRI or Home Care Consult Complete Complete Complete  SW Recovery Care/Counseling Consult Complete Complete Complete  Palliative Care Screening Not Applicable Not Applicable Not Applicable  Skilled Nursing Facility Not Applicable Not Applicable Not Applicable

## 2022-10-30 DIAGNOSIS — R569 Unspecified convulsions: Secondary | ICD-10-CM | POA: Diagnosis not present

## 2022-10-30 LAB — GLUCOSE, CAPILLARY
Glucose-Capillary: 198 mg/dL — ABNORMAL HIGH (ref 70–99)
Glucose-Capillary: 204 mg/dL — ABNORMAL HIGH (ref 70–99)
Glucose-Capillary: 166 mg/dL — ABNORMAL HIGH (ref 70–99)
Glucose-Capillary: 146 mg/dL — ABNORMAL HIGH (ref 70–99)

## 2022-10-30 MED ORDER — OXYCODONE HCL 5 MG PO TABS
5.0000 mg | ORAL_TABLET | Freq: Three times a day (TID) | ORAL | 0 refills | Status: DC | PRN
Start: 2022-10-30 — End: 2022-11-27

## 2022-10-30 MED ORDER — RAMELTEON 8 MG PO TABS: 8.0000 mg | ORAL_TABLET | Freq: Every day | ORAL | 0 refills | Status: DC

## 2022-10-30 MED ORDER — DEXAMETHASONE 4 MG PO TABS: 4.0000 mg | ORAL_TABLET | Freq: Two times a day (BID) | ORAL | Status: DC

## 2022-10-30 MED ORDER — CLONAZEPAM 0.5 MG PO TBDP: 0.5000 mg | ORAL_TABLET | Freq: Two times a day (BID) | ORAL | 0 refills | Status: DC

## 2022-10-30 MED ORDER — LORAZEPAM 1 MG PO TABS: 1.0000 mg | ORAL_TABLET | Freq: Four times a day (QID) | ORAL | 0 refills | Status: DC | PRN

## 2022-10-30 MED ORDER — DOCUSATE SODIUM 100 MG PO CAPS: 100.0000 mg | ORAL_CAPSULE | Freq: Two times a day (BID) | ORAL | Status: DC | PRN

## 2022-10-30 MED ORDER — LEVOFLOXACIN 500 MG PO TABS: 500.0000 mg | ORAL_TABLET | Freq: Every day | ORAL | Status: DC

## 2022-10-30 MED ORDER — FLUCONAZOLE 200 MG PO TABS: 800.0000 mg | ORAL_TABLET | Freq: Every day | ORAL | Status: DC

## 2022-10-30 NOTE — Progress Notes (Signed)
Physical Therapy Treatment Patient Details Name: Carolyn Hooper MRN: [id number] DOB: 22-Feb-1961 Today's Date: 10/30/2022   History of Present Illness Pt is a 62 y/o female presenting to ED on 10/13/2022 due to reports of seizure like activity with fluttering eye lids and difficulty in speech. Pt indicates 2 seizures in 2 days, fall and poor PO intake with N and V.  Pt d/c from hospital following admission 7/20-7/23/2024 in which CT revealing episode of acute pancreatitis with new small fluid collection and found to be hypokalemic. Pt seen by GI and recommendation for conservative measures. Pt provided with IV dilaudid and pain improved.  Pt hospitalized 7/6-7/15/2024 with abdominal pain and hospitalized 6/19-6/27 with epigastric pain. CT abdomen revealed enhancing fluid collection in stomach. S/p cystogastrostomy on 7/11. MRI of brain on 09/29/2022 reveals stable size of 15 x 13 x 14 mm peripherally enhancing metastasis in the posterior left frontal lobe, stable punctate enhancing metastasis in the medial right frontal lobe. Pt PMH includes but is not limited to:  non-small cell lung CA, brain metastasis, focal seizures, DM and pt currently on immunotherapy/chemotherapy.    PT Comments  Pt agreeable to working with therapies (PT and OT). She continues to require significant +2 assist for safe mobility. She remains at high risk for falls. Some improvement with bed mobility on today but pt was unable to stand with +2 assist and STEDY despite multiple attempts. Pt is weak. She was better able to participate with conversation on today compared to last session's notes. She is motivated to regain her strength and function. She has difficulty focusing on task at hand (multiple conversation topics), at times, and thus this interferes with time actually spent on mobility.     If plan is discharge home, recommend the following: Two people to help with walking and/or transfers;Two people to help with  bathing/dressing/bathroom   Can travel by private vehicle     No  Equipment Recommendations  None recommended by PT    Recommendations for Other Services       Precautions / Restrictions Precautions Precautions: Fall Restrictions Weight Bearing Restrictions: No     Mobility  Bed Mobility Overal bed mobility: Needs Assistance Bed Mobility: Rolling, Sidelying to Sit Rolling: Min assist Sidelying to sit: Max assist, Used rails, HOB elevated     Sit to sidelying: Mod assist, +2 for physical assistance, +2 for safety/equipment, Used rails General bed mobility comments: Increased time and max A for trunk to sit. To return to supine Mod A x 2 with trunk and legs to guide back into bed. Cues provided    Transfers Overall transfer level: Needs assistance Equipment used: Rolling walker (2 wheels) Transfers: Sit to/from Stand Sit to Stand: Total assist, +2 physical assistance, +2 safety/equipment, From elevated surface, Via lift equipment           General transfer comment: Began with standing trials from STEDY-pt unable to achieve full standing position on today. Pt again expressed fear but it did not hinder her from putting forth effort to participate. Very weak. Remains high fall risk. Cues for safety, technique, hand/feet position, need for elevated bed surface. Pt then requested to have a female staff member in to attempt standing-RN Nate obliged-pt unable to stand using RW. Transfer via Lift Equipment: Producer, television/film/video  Tilt Bed    Modified Rankin (Stroke Patients Only)       Balance Overall balance assessment: Needs assistance, History of Falls Sitting-balance support: Feet supported, Bilateral upper extremity supported Sitting balance-Leahy Scale: Fair       Standing balance-Leahy Scale: Zero                              Cognition Arousal: Alert Behavior During  Therapy: Flat affect Overall Cognitive Status: Impaired/Different from baseline Area of Impairment: Following commands                       Following Commands: Follows one step commands with increased time     Problem Solving: Slow processing General Comments: Responses still slow. Pt was able to maintain conversation a bit better compared to last session notes. Extensive time spent on discussion with pt on today-this tends to impact activity time during session        Exercises      General Comments        Pertinent Vitals/Pain Pain Assessment Pain Assessment: Faces Faces Pain Scale: No hurt    Home Living                          Prior Function            PT Goals (current goals can now be found in the care plan section) Progress towards PT goals: Progressing toward goals (but no= making signifiant gains;performance flucutates)    Frequency    Min 1X/week      PT Plan Current plan remains appropriate    Co-evaluation PT/OT/SLP Co-Evaluation/Treatment: Yes Reason for Co-Treatment: Complexity of the patient's impairments (multi-system involvement);For patient/therapist safety PT goals addressed during session: Mobility/safety with mobility        AM-PAC PT "6 Clicks" Mobility   Outcome Measure  Help needed turning from your back to your side while in a flat bed without using bedrails?: A Lot Help needed moving from lying on your back to sitting on the side of a flat bed without using bedrails?: A Lot Help needed moving to and from a bed to a chair (including a wheelchair)?: Total Help needed standing up from a chair using your arms (e.g., wheelchair or bedside chair)?: Total Help needed to walk in hospital room?: Total Help needed climbing 3-5 steps with a railing? : Total 6 Click Score: 8    End of Session Equipment Utilized During Treatment: Gait belt Activity Tolerance: Patient tolerated treatment well Patient left: in bed;with  nursing/sitter in room Nurse Communication: Mobility status PT Visit Diagnosis: Unsteadiness on feet (R26.81);Muscle weakness (generalized) (M62.81);Difficulty in walking, not elsewhere classified (R26.2)     Time: 1000-1053 PT Time Calculation (min) (ACUTE ONLY): 53 min  Charges:    $Therapeutic Activity: 23-37 mins PT General Charges $$ ACUTE PT VISIT: 1 Visit                         Faye Ramsay, PT Acute Rehabilitation  Office: [phone]

## 2022-10-30 NOTE — Progress Notes (Signed)
PROGRESS NOTE    Carolyn Hooper  TGG:[id number] DOB: [birth year] DOA: 10/13/2022 PCP: [name], FNP   Brief Narrative:  62 yrs old female with medical history significant of hidradenitis suppurativa complicated by abscess, anxiety, type 2 diabetes, hyperinsulinemia, hypertension, gestational preeclampsia, auditory neuroma, history of pancreatitis, vertigo, seizures, history of lung cancer,  metastatic to brain who is brought to the emergency department by a relative after having seizures for the past 2 days. She describes her being unable to talk and fast blinking of her eyelids.  Patient also continued to complaining about abdominal pain from pancreatitis. Pertinent  labs include lipase 297, CT head showing progression of left frontal metastasis associated with vasogenic edema since 2 weeks ago.  Mild new mass effect on the left lateral ventricle but no midline shift or other complicating features.  Patient is admitted for further evaluation and started on Decadron as per Dr. Liana Gerold recommendation.    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  Discussed case with neuro-oncology.  Recommending Decadron and Keppra    Abdominal pain secondary to acute pancreatitis: Pain control, antiemetics, PPI.  Continue pancreatic enzymes   Pancreatic pseudocyst, Infected: Positive for Candida and Streptococcus viridans.  CT scan showed evidence of 28X 21 mm fluid collection in the pancreatic body and in the stomach concerning for  infected pseudocyst versus abscess.  This is not amenable to drainage per IR.  Holding off on any endoscopic evaluation per GI.  ID recommending fluconazole 800 mg daily and Levaquin 500 mg daily until ID visit on 9/13   Type 2 diabetes with hyperglycemia: Last HbA1c was 8.4% on 09/04/2022. Adjust Semglee and Premeal insulin as necessary   GAD (generalized anxiety disorder) Continue Klonopin 0.5 mg every 12 hours as needed.   GERD (gastroesophageal reflux disease) Continue pantoprazole 40 mg daily. Antiacid, H2 blocker as needed.   Hypokalemia Replaced.  Resolved.   Suicidal thoughts: Patient has told staff that she has thoughts about hurting herself.   Psychiatry was consulted and found to be not a suicidal risk, psych signed off   Goals of care: Palliative Care is following -recs noted for SNF for rehab with palliative care service follow up   Afib -Currently rate controlled.  Holding off on anticoagulation due to brain mets which is high risk for bleeding  PT/OT = SNF  DVT prophylaxis: SCDs Start: 10/13/22 0813   Code Status: DNR Family Communication:   Status is: Inpatient Remains inpatient appropriate because: SNF pending placement.  Overall medically stable from my standpoint at this time.        Diet Orders (From admission, onward)     Start     Ordered   10/25/22 1825  Diet regular Room service appropriate? Yes; Fluid consistency: Thin  Diet effective now       Question Answer Comment  Room service appropriate? Yes   Fluid consistency: Thin      10/25/22 1824            Subjective: No  complaints.   Examination:  General exam: Appears calm and comfortable, forgetful Respiratory system: Clear to auscultation. Respiratory effort normal. Cardiovascular system: S1 & S2 heard, RRR. No JVD, murmurs, rubs, gallops or clicks. No pedal edema. Gastrointestinal system: Abdomen is nondistended, soft and nontender. No organomegaly or masses felt. Normal  bowel sounds heard. Central nervous system: Alert and oriented. No focal neurological deficits. Extremities: Symmetric 5 x 5 power. Skin: No rashes, lesions or ulcers Psychiatry: Judgement and insight appear Poor  Objective: Vitals:   10/29/22 1535 10/29/22 2312 10/30/22 0620 10/30/22 0621  BP: 138/65 (!) 154/97 (!) 190/84 (!) 156/67  Pulse: 69 (!) 53 66 75  Resp: 18     Temp: 97.7 F (36.5 C) 97.7 F (36.5 C) (!) 97.4 F (36.3 C)   TempSrc: Oral Oral Oral   SpO2: 96% 94% 95%   Weight:      Height:        Intake/Output Summary (Last 24 hours) at 10/30/2022 0916 Last data filed at 10/30/2022 0700 Gross per 24 hour  Intake 1424.18 ml  Output 1350 ml  Net 74.18 ml   Filed Weights   10/13/22 0429  Weight: 54.4 kg    Scheduled Meds:  antiseptic oral rinse  15 mL Mouth Rinse BID   Chlorhexidine Gluconate Cloth  6 each Topical Daily   clonazepam  0.5 mg Oral BID   dexamethasone (DECADRON) injection  4 mg Intravenous Q6H   docusate sodium  100 mg Oral BID   feeding supplement  237 mL Oral BID BM   fluconazole  800 mg Oral Daily   folic acid  1 mg Oral Daily   insulin aspart  0-15 Units Subcutaneous TID WC   insulin aspart  5 Units Subcutaneous TID WC   insulin glargine-yfgn  20 Units Subcutaneous Daily   levETIRAcetam  1,000 mg Oral BID   levofloxacin  500 mg Oral Daily   lipase/protease/amylase  72,000 Units Oral TID WC   pantoprazole  40 mg Oral Daily   ramelteon  8 mg Oral QHS   senna-docusate  1 tablet Oral BID   sodium chloride flush  10-40 mL Intracatheter Q12H   Continuous Infusions:  lactated ringers 50 mL/hr at 10/29/22 1740    Nutritional status     Body mass index is 18.25 kg/m.  Data Reviewed:   CBC: Recent Labs  Lab 10/24/22 0245 10/25/22 0306 10/26/22 0152 10/30/22 0346  WBC 10.1 12.5* 12.9* 15.1*  HGB 11.3* 11.3* 11.7* 12.6  HCT 36.5 36.1 36.8 39.0  MCV 98.1 98.4 97.4 97.0  PLT 105* 110* 101* 101*   Basic Metabolic Panel: Recent  Labs  Lab 10/24/22 0245 10/25/22 0306 10/26/22 0152 10/30/22 0346  NA 136 137 135 135  K 3.8 3.9 3.9 3.6  CL 95* 97* 97* 100  CO2 30 30 29 26   GLUCOSE 252* 187* 238* 182*  BUN 23 18 17 20   CREATININE 0.81 0.65 0.53 0.64  CALCIUM 8.6* 8.6* 8.4* 8.4*   GFR: Estimated Creatinine Clearance: 62.6 mL/min (by C-G formula based on SCr of 0.64 mg/dL). Liver Function Tests: Recent Labs  Lab 10/24/22 0245 10/25/22 0306 10/26/22 0152 10/30/22 0346  AST 22 23 22 22   ALT 27 26 28  35  ALKPHOS 75 84 89 118  BILITOT 0.9 0.7 0.8 0.9  PROT 4.8* 5.0* 4.9* 5.1*  ALBUMIN 2.5* 2.3* 2.3* 2.4*   No results for input(s): "LIPASE", "AMYLASE" in the last 168 hours. No results for input(s): "AMMONIA" in the last  168 hours. Coagulation Profile: No results for input(s): "INR", "PROTIME" in the last 168 hours. Cardiac Enzymes: No results for input(s): "CKTOTAL", "CKMB", "CKMBINDEX", "TROPONINI" in the last 168 hours. BNP (last 3 results) No results for input(s): "PROBNP" in the last 8760 hours. HbA1C: No results for input(s): "HGBA1C" in the last 72 hours. CBG: Recent Labs  Lab 10/29/22 1119 10/29/22 1533 10/29/22 1705 10/29/22 2315 10/30/22 0826  GLUCAP 190* 142* 131* 81 198*   Lipid Profile: No results for input(s): "CHOL", "HDL", "LDLCALC", "TRIG", "CHOLHDL", "LDLDIRECT" in the last 72 hours. Thyroid Function Tests: No results for input(s): "TSH", "T4TOTAL", "FREET4", "T3FREE", "THYROIDAB" in the last 72 hours. Anemia Panel: No results for input(s): "VITAMINB12", "FOLATE", "FERRITIN", "TIBC", "IRON", "RETICCTPCT" in the last 72 hours. Sepsis Labs: No results for input(s): "PROCALCITON", "LATICACIDVEN" in the last 168 hours.  No results found for this or any previous visit (from the past 240 hour(s)).       Radiology Studies: No results found.         LOS: 16 days   Time spent= 35 mins     Joline Maxcy, MD [facility]  If 7PM-7AM, please contact  night-coverage  10/30/2022, 9:16 AM

## 2022-10-30 NOTE — Progress Notes (Signed)
Occupational Therapy Treatment Patient Details Name: Carolyn Hooper MRN: [id number] DOB: 07-17-1960 Today's Date: 10/30/2022   History of present illness Pt is a 62 y/o female presenting to ED on 10/13/2022 due to reports of seizure like activity with fluttering eye lids and difficulty in speech. Pt indicates 2 seizures in 2 days, fall and poor PO intake with N and V.  Pt d/c from hospital following admission 7/20-7/23/2024 in which CT revealing episode of acute pancreatitis with new small fluid collection and found to be hypokalemic. Pt seen by GI and recommendation for conservative measures. Pt provided with IV dilaudid and pain improved.  Pt hospitalized 7/6-7/15/2024 with abdominal pain and hospitalized 6/19-6/27 with epigastric pain. CT abdomen revealed enhancing fluid collection in stomach. S/p cystogastrostomy on 7/11. MRI of brain on 09/29/2022 reveals stable size of 15 x 13 x 14 mm peripherally enhancing metastasis in the posterior left frontal lobe, stable punctate enhancing metastasis in the medial right frontal lobe. Pt PMH includes but is not limited to:  non-small cell lung CA, brain metastasis, focal seizures, DM and pt currently on immunotherapy/chemotherapy.   OT comments  Patient expressed increased fear of falling and desire to go home. Patient currently unable to stand with +2 and STEDY assistance with multiple attempts. Patient very frustrated during session with slow speech and word salad noted at times. Patient transitioning to various topics during session interfering with time to focus on movement. Patient's discharge plan remains appropriate at this time. OT will continue to follow acutely.        If plan is discharge home, recommend the following:  A little help with walking and/or transfers;A little help with bathing/dressing/bathroom;Assistance with cooking/housework;Help with stairs or ramp for entrance;Direct supervision/assist for medications management;Direct  supervision/assist for financial management;Assist for transportation   Equipment Recommendations  None recommended by OT       Precautions / Restrictions Precautions Precautions: Fall Restrictions Weight Bearing Restrictions: No       Mobility Bed Mobility Overal bed mobility: Needs Assistance Bed Mobility: Rolling, Sidelying to Sit Rolling: Min assist Sidelying to sit: Max assist, Used rails, HOB elevated     Sit to sidelying: Mod assist, +2 for physical assistance, +2 for safety/equipment, Used rails General bed mobility comments: Increased time and max A for trunk to sit. To return to supine Mod A x 2 with trunk and legs to guide back into bed. Cues provided          Balance Overall balance assessment: Needs assistance, History of Falls Sitting-balance support: Feet supported, Bilateral upper extremity supported Sitting balance-Leahy Scale: Fair     Standing balance support: During functional activity, Reliant on assistive device for balance, Bilateral upper extremity supported Standing balance-Leahy Scale: Zero Standing balance comment: unable to clear bottom off bed.           ADL either performed or assessed with clinical judgement   ADL Overall ADL's : Needs assistance/impaired         General ADL Comments: attempted standing at EOB with patient requesting to have a man present x2 during session. patient reproted " two men can stand me up". patient's female nurse was called into room and patient attempted to stand on 4th attempt with patient unable to clear bottom from bed. patient very upset with not being able to go home. patient was educated that if she was unable to stand she would be unable to go home per her family report. patient verbalized understanding but continued to verbalize frustrations  about situation.      Cognition Arousal: Alert Behavior During Therapy: Flat affect Overall Cognitive Status: Impaired/Different from baseline Area of  Impairment: Following commands     Following Commands: Follows one step commands with increased time     Problem Solving: Slow processing General Comments: Responses still slow. Pt was able to maintain conversation a bit better compared to last session notes. Extensive time spent on discussion with pt on today-this tends to impact activity time during session                   Pertinent Vitals/ Pain       Pain Assessment Pain Assessment: Faces Faces Pain Scale: No hurt         Frequency  Min 1X/week        Progress Toward Goals  OT Goals(current goals can now be found in the care plan section)  Progress towards OT goals: Not progressing toward goals - comment (unable to stand in STEDY today)     Plan      Co-evaluation    PT/OT/SLP Co-Evaluation/Treatment: Yes Reason for Co-Treatment: Complexity of the patient's impairments (multi-system involvement);For patient/therapist safety PT goals addressed during session: Mobility/safety with mobility OT goals addressed during session: ADL's and self-care      AM-PAC OT "6 Clicks" Daily Activity     Outcome Measure   Help from another person eating meals?: A Little Help from another person taking care of personal grooming?: A Little Help from another person toileting, which includes using toliet, bedpan, or urinal?: Total Help from another person bathing (including washing, rinsing, drying)?: Total Help from another person to put on and taking off regular upper body clothing?: A Little Help from another person to put on and taking off regular lower body clothing?: Total 6 Click Score: 12    End of Session Equipment Utilized During Treatment: Gait belt;Rolling walker (2 wheels)  OT Visit Diagnosis: Unsteadiness on feet (R26.81);Other abnormalities of gait and mobility (R26.89);Muscle weakness (generalized) (M62.81)   Activity Tolerance Patient tolerated treatment well   Patient Left in bed;with call bell/phone  within reach;with bed alarm set   Nurse Communication Mobility status        Time: 7829-5621 OT Time Calculation (min): 54 min  Charges: OT General Charges $OT Visit: 1 Visit OT Treatments $Therapeutic Activity: 23-37 mins  Rosalio Loud, MS Acute Rehabilitation Department Office# [phone]   Selinda Flavin 10/30/2022, 12:27 PM

## 2022-10-30 NOTE — Plan of Care (Signed)

## 2022-10-30 NOTE — TOC Progression Note (Addendum)
Transition of Care [facility]) - Progression Note    Patient Details  Name: Carolyn Hooper MRN: [id number] Date of Birth: [birth year]  Transition of Care [facility]) CM/SW Contact  [name], RN Phone Number: 10/30/2022, 11:42 AM  Clinical Narrative:  Return call from [place] at [facility], requesting FL2, therapy and MD notes be emailed to her at [email or web address], states curently do not have an admissions coordinator, DON will review for bed acceptance.    -3:28pm Call to [place] at [facility], confirmed bed acceptance pending. NCM emailed updated PT/OT/MD notes emailed to [place] at email above.  NCM outreached to pt's mother Carolyn Hooper), discussed delay in bed acceptance at [facility], requested family look at other facilities that made bed offers, [facility] agreeable. Will follow up with [facility], if no decision, will outreach family to discuss and decide on new bed offer selection.          Expected Discharge Plan and Services                                               Social Determinants of Health (SDOH) Interventions SDOH Screenings   Food Insecurity: No Food Insecurity (10/14/2022)  Housing: Low Risk  (10/14/2022)  Transportation Needs: No Transportation Needs (10/14/2022)  Utilities: Not At Risk (10/14/2022)  Depression (PHQ2-9): Medium Risk (07/12/2022)  Tobacco Use: Medium Risk (10/14/2022)    Readmission Risk Interventions    10/08/2022    4:55 PM 09/24/2022    3:55 PM 09/07/2022    2:44 PM  Readmission Risk Prevention Plan  Transportation Screening Complete Complete Complete  Medication Review [facility]) Complete Complete Complete  PCP or Specialist appointment within 3-5 days of discharge Complete Complete Complete  HRI or Home Care Consult Complete Complete Complete  SW Recovery Care/Counseling Consult Complete Complete Complete  Palliative Care Screening Not Applicable Not Applicable Not Applicable  Skilled Nursing Facility Not  Applicable Not Applicable Not Applicable

## 2022-10-31 DIAGNOSIS — R569 Unspecified convulsions: Secondary | ICD-10-CM | POA: Diagnosis not present

## 2022-10-31 LAB — GLUCOSE, CAPILLARY
Glucose-Capillary: 153 mg/dL — ABNORMAL HIGH (ref 70–99)
Glucose-Capillary: 153 mg/dL — ABNORMAL HIGH (ref 70–99)
Glucose-Capillary: 98 mg/dL (ref 70–99)
Glucose-Capillary: 196 mg/dL — ABNORMAL HIGH (ref 70–99)

## 2022-10-31 MED ORDER — HYDRALAZINE HCL 20 MG/ML IJ SOLN
5.0000 mg | Freq: Once | INTRAMUSCULAR | Status: AC
  Administered 2022-10-31: 5 mg via INTRAVENOUS
  Filled 2022-10-31: qty 1

## 2022-10-31 MED ORDER — AMLODIPINE BESYLATE 5 MG PO TABS: 5.0000 mg | ORAL_TABLET | Freq: Every day | ORAL | Status: DC

## 2022-10-31 MED ORDER — AMLODIPINE BESYLATE 5 MG PO TABS
5.0000 mg | ORAL_TABLET | Freq: Every day | ORAL | Status: DC
  Administered 2022-10-31 – 2022-11-04 (×5): 5 mg via ORAL
  Filled 2022-10-31 (×5): qty 1

## 2022-10-31 NOTE — Progress Notes (Signed)
PROGRESS NOTE    Carolyn Hooper  ZOX:[id number] DOB: 1960-05-24 DOA: 10/13/2022 PCP: [name], FNP    62 yrs old female with medical history significant of hidradenitis suppurativa complicated by abscess, anxiety, type 2 diabetes, hyperinsulinemia, hypertension, gestational preeclampsia, auditory neuroma, history of pancreatitis, vertigo, seizures, history of lung cancer,  metastatic to brain who is brought to the emergency department by a relative after having seizures for the past 2 days. She describes her being unable to talk and fast blinking of her eyelids.  Patient also continued to complaining about abdominal pain from pancreatitis. Pertinent  labs include lipase 297, CT head showing progression of left frontal metastasis associated with vasogenic edema since 2 weeks ago.  Mild new mass effect on the left lateral ventricle but no midline shift or other complicating features.  Patient is admitted for further evaluation and started on Decadron as per Dr. Liana Gerold recommendation.  Hospital course was complicated by positive Candida and Streptococcus viridans.  CT scan showed pancreatic infected pseudocyst versus abscess.  Patient was seen by infectious disease and GI.  No further intervention per GI at this time.  ID recommended fluconazole and Levaquin until November 29, 2022 until outpatient ID follow-up.  PT/OT has recommended SNF.     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  Discussed case with neuro-oncology.   Continue Decadron and Keppra     Abdominal pain secondary to acute pancreatitis: Pain control, antiemetics, PPI.  Continue pancreatic enzymes   Pancreatic pseudocyst, Infected: Positive for Candida and Streptococcus viridans.  CT scan showed evidence of 28X 21 mm fluid collection in the pancreatic body and in the stomach concerning for infected pseudocyst versus abscess.  This is not amenable to drainage per IR.  Holding off on any endoscopic evaluation per GI.  ID recommending fluconazole 800 mg daily and Levaquin 500 mg daily until ID visit on 9/13   Type 2 diabetes with hyperglycemia: Last HbA1c was 8.4% on 09/04/2022. Adjust Semglee and Premeal insulin as necessary  Essential hypertension, uncontrolled - Will start Norvasc 5 mg daily.   GAD (generalized anxiety disorder) Continue Klonopin 0.5 mg every 12 hours as needed.   GERD (gastroesophageal reflux disease) Continue pantoprazole 40 mg daily. Antiacid, H2 blocker as needed.   Hypokalemia Replaced.  Resolved.   Suicidal thoughts: Patient has told staff that she has thoughts about hurting herself.   Psychiatry was consulted and found to be not a suicidal risk, psych signed off   Goals of care: Palliative Care is following Transferred to rehab with palliative care services to follow the patient.   Afib -Currently rate controlled.  Holding off on anticoagulation due to brain mets which is high risk for bleeding   PT/OT = SNF with palliative care services to follow             Diet Orders (From admission, onward)     Start     Ordered   10/25/22 1825  Diet regular Room service appropriate? Yes; Fluid  consistency: Thin  Diet effective now       Question Answer Comment  Room service appropriate? Yes   Fluid consistency: Thin      10/25/22 1824            Subjective: No complaints. Does not necessarily make sense what she is speaking at times.  Examination:  General exam: Appears calm and  comfortable  Respiratory system: Clear to auscultation. Respiratory effort normal. Cardiovascular system: S1 & S2 heard, RRR. No JVD, murmurs, rubs, gallops or clicks. No pedal edema. Gastrointestinal system: Abdomen is nondistended, soft and nontender. No organomegaly or masses felt. Normal bowel sounds heard. Central nervous system: Alert and oriented. No focal neurological deficits. Extremities: Symmetric 4 x 5 power. Skin: No rashes, lesions or ulcers Psychiatry: Judgement and insight appear poor  Objective: Vitals:   10/30/22 1132 10/30/22 1230 10/30/22 2106 10/31/22 0613  BP: (!) 161/81 (!) 154/73 (!) 152/66 (!) 189/82  Pulse: 66 70 63 64  Resp: 17  18 16   Temp: 97.6 F (36.4 C) 97.8 F (36.6 C) 98.1 F (36.7 C) 97.8 F (36.6 C)  TempSrc: Oral Oral Oral Oral  SpO2: 99% 98% 98% 97%  Weight:      Height:        Intake/Output Summary (Last 24 hours) at 10/31/2022 1020 Last data filed at 10/31/2022 0300 Gross per 24 hour  Intake 1037.38 ml  Output 900 ml  Net 137.38 ml   Filed Weights   10/13/22 0429  Weight: 54.4 kg    Scheduled Meds:  amLODipine  5 mg Oral Daily   antiseptic oral rinse  15 mL Mouth Rinse BID   Chlorhexidine Gluconate Cloth  6 each Topical Daily   clonazepam  0.5 mg Oral BID   dexamethasone (DECADRON) injection  4 mg Intravenous Q6H   docusate sodium  100 mg Oral BID   feeding supplement  237 mL Oral BID BM   fluconazole  800 mg Oral Daily   folic acid  1 mg Oral Daily   insulin aspart  0-15 Units Subcutaneous TID WC   insulin aspart  5 Units Subcutaneous TID WC   insulin glargine-yfgn  20 Units Subcutaneous Daily   levETIRAcetam  1,000 mg Oral BID   levofloxacin  500 mg Oral Daily   lipase/protease/amylase  72,000 Units Oral TID WC   pantoprazole  40 mg Oral Daily   ramelteon  8 mg Oral QHS   senna-docusate  1 tablet Oral BID   sodium chloride flush  10-40 mL Intracatheter Q12H   Continuous Infusions:  lactated ringers 50 mL/hr at  10/31/22 0607    Nutritional status     Body mass index is 18.25 kg/m.  Data Reviewed:   CBC: Recent Labs  Lab 10/25/22 0306 10/26/22 0152 10/30/22 0346  WBC 12.5* 12.9* 15.1*  HGB 11.3* 11.7* 12.6  HCT 36.1 36.8 39.0  MCV 98.4 97.4 97.0  PLT 110* 101* 101*   Basic Metabolic Panel: Recent Labs  Lab 10/25/22 0306 10/26/22 0152 10/30/22 0346  NA 137 135 135  K 3.9 3.9 3.6  CL 97* 97* 100  CO2 30 29 26   GLUCOSE 187* 238* 182*  BUN 18 17 20   CREATININE 0.65 0.53 0.64  CALCIUM 8.6* 8.4* 8.4*   GFR: Estimated Creatinine Clearance: 62.6 mL/min (by C-G formula based on SCr of 0.64 mg/dL). Liver Function Tests: Recent Labs  Lab 10/25/22 0306 10/26/22 0152 10/30/22 0346  AST 23 22 22   ALT 26 28 35  ALKPHOS 84 89 118  BILITOT 0.7 0.8 0.9  PROT 5.0* 4.9* 5.1*  ALBUMIN 2.3* 2.3* 2.4*   No results for input(s): "LIPASE", "AMYLASE" in the last 168 hours. No results for input(s): "AMMONIA" in the last 168 hours. Coagulation Profile: No results for input(s): "INR", "PROTIME" in the last 168 hours. Cardiac Enzymes: No results for input(s): "CKTOTAL", "CKMB", "CKMBINDEX", "TROPONINI" in the last 168 hours. BNP (last 3 results) No results for input(s): "PROBNP" in the last 8760 hours. HbA1C: No results for input(s): "HGBA1C" in the last 72 hours. CBG: Recent Labs  Lab 10/30/22 0826 10/30/22 1128 10/30/22 1630 10/30/22 2108 10/31/22 0757  GLUCAP 198* 204* 166* 146* 153*   Lipid Profile: No results for input(s): "CHOL", "HDL", "LDLCALC", "TRIG", "CHOLHDL", "LDLDIRECT" in the last 72 hours. Thyroid Function Tests: No results for input(s): "TSH", "T4TOTAL", "FREET4", "T3FREE", "THYROIDAB" in the last 72 hours. Anemia Panel: No results for input(s): "VITAMINB12", "FOLATE", "FERRITIN", "TIBC", "IRON", "RETICCTPCT" in the last 72 hours. Sepsis Labs: No results for input(s): "PROCALCITON", "LATICACIDVEN" in the last 168 hours.  No results found for this or any  previous visit (from the past 240 hour(s)).       Radiology Studies: No results found.         LOS: 17 days   Time spent= 35 mins     Joline Maxcy, MD [facility]  If 7PM-7AM, please contact night-coverage  10/31/2022, 10:20 AM

## 2022-10-31 NOTE — TOC Progression Note (Signed)
Transition of Care [facility]) - Progression Note    Patient Details  Name: Carolyn Hooper MRN: [id number] Date of Birth: [birth year]  Transition of Care [facility]) CM/SW Contact  [name], RN Phone Number: 10/31/2022, 12:17 PM  Clinical Narrative:   Call received this morning from Hiram Comber CM, discussed current discharge plan, awaiting to hear from [facility] for bed acceptance. Christine offered list of contracted SNF for short term rehab, NCM provided email, will provide to family as needed   -12:19pm Call to [place], SW at [facility], reports per facility DON, they are not able to meet's pt's needs.  NCM called to [name] (son) , provided update on [facility] unable to offer bed. NCM advised [name] of list of SNF received from [facility] CM, requested list be emailed to him at [email or web address], list emailed as requested, NCM requested review and to contact NCM with facility choice asap.          Expected Discharge Plan and Services                                               Social Determinants of Health (SDOH) Interventions SDOH Screenings   Food Insecurity: No Food Insecurity (10/14/2022)  Housing: Low Risk  (10/14/2022)  Transportation Needs: No Transportation Needs (10/14/2022)  Utilities: Not At Risk (10/14/2022)  Depression (PHQ2-9): Medium Risk (07/12/2022)  Tobacco Use: Medium Risk (10/14/2022)    Readmission Risk Interventions    10/08/2022    4:55 PM 09/24/2022    3:55 PM 09/07/2022    2:44 PM  Readmission Risk Prevention Plan  Transportation Screening Complete Complete Complete  Medication Review Oceanographer) Complete Complete Complete  PCP or Specialist appointment within 3-5 days of discharge Complete Complete Complete  HRI or Home Care Consult Complete Complete Complete  SW Recovery Care/Counseling Consult Complete Complete Complete  Palliative Care Screening Not Applicable Not Applicable Not Applicable  Skilled Nursing Facility  Not Applicable Not Applicable Not Applicable

## 2022-10-31 NOTE — Plan of Care (Signed)
  Problem: Education: Goal: Knowledge of General Education information will improve Description: Including pain rating scale, medication(s)/side effects and non-pharmacologic comfort measures Outcome: Progressing   Problem: Coping: Goal: Level of anxiety will decrease Outcome: Progressing   Problem: Elimination: Goal: Will not experience complications related to bowel motility Outcome: Progressing   Problem: Pain Managment: Goal: General experience of comfort will improve Outcome: Progressing   Problem: Skin Integrity: Goal: Risk for impaired skin integrity will decrease Outcome: Progressing   Problem: Coping: Goal: Ability to adjust to condition or change in health will improve Outcome: Not Progressing   Problem: Fluid Volume: Goal: Ability to maintain a balanced intake and output will improve Outcome: Not Progressing   Problem: Health Behavior/Discharge Planning: Goal: Ability to identify and utilize available resources and services will improve Outcome: Not Progressing Goal: Ability to manage health-related needs will improve Outcome: Not Progressing   Problem: Nutritional: Goal: Maintenance of adequate nutrition will improve Outcome: Not Progressing   Problem: Health Behavior/Discharge Planning: Goal: Ability to manage health-related needs will improve Outcome: Not Progressing   Problem: Activity: Goal: Risk for activity intolerance will decrease Outcome: Not Progressing   Problem: Nutrition: Goal: Adequate nutrition will be maintained Outcome: Not Progressing

## 2022-10-31 NOTE — Plan of Care (Signed)
  Problem: Coping: Goal: Ability to adjust to condition or change in health will improve Outcome: Not Progressing   Problem: Metabolic: Goal: Ability to maintain appropriate glucose levels will improve Outcome: Progressing   Problem: Skin Integrity: Goal: Risk for impaired skin integrity will decrease Outcome: Progressing   Problem: Tissue Perfusion: Goal: Adequacy of tissue perfusion will improve Outcome: Progressing   Problem: Coping: Goal: Level of anxiety will decrease Outcome: Not Progressing   Problem: Pain Managment: Goal: General experience of comfort will improve Outcome: Progressing

## 2022-11-01 DIAGNOSIS — R569 Unspecified convulsions: Secondary | ICD-10-CM | POA: Diagnosis not present

## 2022-11-01 LAB — GLUCOSE, CAPILLARY
Glucose-Capillary: 238 mg/dL — ABNORMAL HIGH (ref 70–99)
Glucose-Capillary: 265 mg/dL — ABNORMAL HIGH (ref 70–99)
Glucose-Capillary: 188 mg/dL — ABNORMAL HIGH (ref 70–99)
Glucose-Capillary: 155 mg/dL — ABNORMAL HIGH (ref 70–99)

## 2022-11-01 MED ORDER — METOPROLOL TARTRATE 5 MG/5ML IV SOLN: 5.0000 mg | INTRAVENOUS | Status: DC | PRN

## 2022-11-01 MED ORDER — POLYETHYLENE GLYCOL 3350 17 G PO PACK
17.0000 g | PACK | Freq: Two times a day (BID) | ORAL | Status: AC
  Administered 2022-11-01 – 2022-11-03 (×6): 17 g via ORAL
  Filled 2022-11-01 (×6): qty 1

## 2022-11-01 MED ORDER — HYDRALAZINE HCL 20 MG/ML IJ SOLN
10.0000 mg | INTRAMUSCULAR | Status: DC | PRN
  Administered 2022-11-01: 10 mg via INTRAVENOUS
  Filled 2022-11-01: qty 1

## 2022-11-01 NOTE — Progress Notes (Signed)
PROGRESS NOTE    Carolyn Hooper  XLK:[id number] DOB: [birth year] DOA: 10/13/2022 PCP: [name], FNP    62 yrs old female with medical history significant of hidradenitis suppurativa complicated by abscess, anxiety, type 2 diabetes, hyperinsulinemia, hypertension, gestational preeclampsia, auditory neuroma, history of pancreatitis, vertigo, seizures, history of lung cancer,  metastatic to brain who is brought to the emergency department by a relative after having seizures for the past 2 days. She describes her being unable to talk and fast blinking of her eyelids.  Patient also continued to complaining about abdominal pain from pancreatitis. Pertinent  labs include lipase 297, CT head showing progression of left frontal metastasis associated with vasogenic edema since 2 weeks ago.  Mild new mass effect on the left lateral ventricle but no midline shift or other complicating features.  Patient is admitted for further evaluation and started on Decadron as per Dr. Liana Gerold recommendation.  Hospital course was complicated by positive Candida and Streptococcus viridans.  CT scan showed pancreatic infected pseudocyst versus abscess.  Patient was seen by infectious disease and GI.  No further intervention per GI at this time.  ID recommended fluconazole and Levaquin until November 29, 2022 until outpatient ID follow-up.  PT/OT has recommended SNF.     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  Discussed case with neuro-oncology.   Continue Decadron and Keppra     Abdominal pain secondary to acute pancreatitis: Pain control, antiemetics, PPI.  Continue pancreatic enzymes   Pancreatic pseudocyst, Infected: Positive for Candida and Streptococcus viridans.  CT scan showed evidence of 28X 21 mm fluid collection in the pancreatic body and in the stomach concerning for infected pseudocyst versus abscess.  This is not amenable to drainage per IR.  Holding off on any endoscopic evaluation per GI.  ID recommending fluconazole 800 mg daily and Levaquin 500 mg daily until ID visit on 9/13   Type 2 diabetes with hyperglycemia: Last HbA1c was 8.4% on 09/04/2022. Adjust Semglee and Premeal insulin as necessary  Essential hypertension, uncontrolled - Norvasc 5 mg orally daily.  Adjust as necessary.  IV as needed   GAD (generalized anxiety disorder) Continue Klonopin 0.5 mg every 12 hours as needed.   GERD (gastroesophageal reflux disease) Continue pantoprazole 40 mg daily. Antiacid, H2 blocker as needed.   Hypokalemia Replaced.  Resolved.   Suicidal thoughts: Patient has told staff that she has thoughts about hurting herself.   Psychiatry was consulted and found to be not a suicidal risk, psych signed off   Goals of care: Palliative Care is following Transferred to rehab with palliative care services to follow the patient.   Afib -Currently rate controlled.  Holding off on anticoagulation due to brain mets which is high risk for bleeding   PT/OT = SNF with palliative care services to follow             Diet Orders (From admission, onward)     Start     Ordered   10/25/22 1825  Diet regular Room service appropriate? Yes; Fluid consistency: Thin  Diet effective now       Question Answer Comment  Room service appropriate? Yes   Fluid consistency: Thin      10/25/22 1824            Subjective: Awaiting placement.  No meaningful conversation.  Family taking a long time to agree on placement.     Examination:  General exam: Appears calm and comfortable ; no meaningful convo.  Respiratory system: Clear to auscultation. Respiratory effort normal. Cardiovascular system: S1 & S2 heard, RRR. No JVD, murmurs, rubs, gallops or clicks. No pedal edema. Gastrointestinal system: Abdomen is nondistended, soft and nontender. No organomegaly or masses felt. Normal bowel sounds heard. Central nervous system: Alert and oriented. No focal neurological deficits. Extremities: Symmetric 5 x 5 power. Skin: No rashes, lesions or ulcers Psychiatry: Judgement and insight appear normal. Mood & affect appropriate.poor  Objective: Vitals:   10/31/22 2223 11/01/22 0606 11/01/22 0623 11/01/22 0902  BP: (!) 160/74 (!) 150/57 116/65 (!) 160/62  Pulse: 83 60 60 86  Resp: 18 16 17 18   Temp: (!) 97.5 F (36.4 C) 98.1 F (36.7 C) 97.9 F (36.6 C) 97.6 F (36.4 C)  TempSrc: Oral Oral Oral Oral  SpO2: 95% 92% 94% 95%  Weight:      Height:        Intake/Output Summary (Last 24 hours) at 11/01/2022 1253 Last data filed at 11/01/2022 2725 Gross per 24 hour  Intake 200 ml  Output 1325 ml  Net -1125 ml   Filed Weights   10/13/22 0429  Weight: 54.4 kg    Scheduled Meds:  amLODipine  5 mg Oral Daily   antiseptic oral rinse  15 mL Mouth Rinse BID   Chlorhexidine Gluconate Cloth  6 each Topical Daily   clonazepam  0.5 mg Oral BID   dexamethasone (DECADRON) injection  4 mg Intravenous Q6H   docusate sodium  100 mg Oral BID   feeding supplement  237 mL Oral BID BM   fluconazole  800 mg Oral Daily   folic acid  1 mg Oral Daily   insulin aspart  0-15 Units Subcutaneous TID WC   insulin aspart  5 Units Subcutaneous TID WC   insulin glargine-yfgn  20 Units Subcutaneous Daily   levETIRAcetam  1,000 mg Oral BID   levofloxacin  500 mg Oral Daily   lipase/protease/amylase  72,000 Units Oral TID WC   pantoprazole  40 mg Oral Daily   polyethylene glycol  17 g Oral BID   ramelteon  8 mg Oral QHS    senna-docusate  1 tablet Oral BID   sodium chloride flush  10-40 mL Intracatheter Q12H   Continuous Infusions:  lactated ringers 50 mL/hr at 11/01/22 0054    Nutritional status     Body mass index is 18.25 kg/m.  Data Reviewed:   CBC: Recent Labs  Lab 10/26/22 0152 10/30/22 0346  WBC 12.9* 15.1*  HGB 11.7* 12.6  HCT 36.8 39.0  MCV 97.4 97.0  PLT 101* 101*   Basic Metabolic Panel: Recent Labs  Lab 10/26/22 0152 10/30/22 0346  NA 135 135  K 3.9 3.6  CL 97* 100  CO2 29 26  GLUCOSE 238* 182*  BUN 17 20  CREATININE 0.53 0.64  CALCIUM 8.4* 8.4*   GFR: Estimated Creatinine Clearance: 62.6 mL/min (by C-G formula based on SCr of 0.64 mg/dL). Liver Function Tests: Recent Labs  Lab 10/26/22 0152 10/30/22 0346  AST 22 22  ALT 28 35  ALKPHOS 89 118  BILITOT 0.8 0.9  PROT 4.9* 5.1*  ALBUMIN 2.3* 2.4*   No results for input(s): "LIPASE", "AMYLASE" in the last 168 hours. No results for input(s): "AMMONIA" in the last 168 hours. Coagulation Profile: No results for input(s): "INR", "PROTIME" in the last 168 hours. Cardiac Enzymes: No results for input(s): "CKTOTAL", "CKMB", "CKMBINDEX", "TROPONINI" in the last 168 hours. BNP (last 3 results) No results for input(s): "PROBNP" in the last 8760 hours. HbA1C: No results for input(s): "HGBA1C" in the last 72 hours. CBG: Recent Labs  Lab 10/31/22 1125 10/31/22 1632 10/31/22 2226 11/01/22 0815 11/01/22 1129  GLUCAP 153* 98 196* 238* 265*   Lipid Profile: No results for input(s): "CHOL", "HDL", "LDLCALC", "TRIG", "CHOLHDL", "LDLDIRECT" in the last 72 hours. Thyroid Function Tests: No results for input(s): "TSH", "T4TOTAL", "FREET4", "T3FREE", "THYROIDAB" in the last 72 hours. Anemia Panel: No results for input(s): "VITAMINB12", "FOLATE", "FERRITIN", "TIBC", "IRON", "RETICCTPCT" in the last 72 hours. Sepsis Labs: No results for input(s): "PROCALCITON", "LATICACIDVEN" in the last 168 hours.  No results found for  this or any previous visit (from the past 240 hour(s)).       Radiology Studies: No results found.         LOS: 18 days   Time spent= 35 mins    Miguel Rota, MD [facility]  If 7PM-7AM, please contact night-coverage  11/01/2022, 12:53 PM

## 2022-11-01 NOTE — Plan of Care (Signed)
  Problem: Education: Goal: Individualized Educational Video(s) Outcome: Progressing   Problem: Coping: Goal: Ability to adjust to condition or change in health will improve Outcome: Progressing   Problem: Fluid Volume: Goal: Ability to maintain a balanced intake and output will improve Outcome: Progressing   Problem: Metabolic: Goal: Ability to maintain appropriate glucose levels will improve Outcome: Progressing   Problem: Skin Integrity: Goal: Risk for impaired skin integrity will decrease Outcome: Progressing   Problem: Education: Goal: Knowledge of General Education information will improve Description: Including pain rating scale, medication(s)/side effects and non-pharmacologic comfort measures Outcome: Progressing   Problem: Health Behavior/Discharge Planning: Goal: Ability to manage health-related needs will improve Outcome: Progressing   Problem: Coping: Goal: Level of anxiety will decrease Outcome: Progressing   Problem: Pain Managment: Goal: General experience of comfort will improve Outcome: Progressing   Problem: Safety: Goal: Ability to remain free from injury will improve Outcome: Progressing   Problem: Skin Integrity: Goal: Risk for impaired skin integrity will decrease Outcome: Progressing   Problem: Nutritional: Goal: Maintenance of adequate nutrition will improve Outcome: Not Progressing   Problem: Activity: Goal: Risk for activity intolerance will decrease Outcome: Not Progressing   Problem: Nutrition: Goal: Adequate nutrition will be maintained Outcome: Not Progressing   Problem: Education: Goal: Ability to describe self-care measures that may prevent or decrease complications (Diabetes Survival Skills Education) will improve Outcome: Adequate for Discharge   Problem: Clinical Measurements: Goal: Diagnostic test results will improve Outcome: Adequate for Discharge Goal: Respiratory complications will improve Outcome: Adequate for  Discharge Goal: Cardiovascular complication will be avoided Outcome: Adequate for Discharge

## 2022-11-01 NOTE — TOC Progression Note (Addendum)
Transition of Care [facility]) - Progression Note    Patient Details  Name: Carolyn Hooper MRN: [id number] Date of Birth: May 07, 1960  Transition of Care [facility]) CM/SW Contact  Xitlalic Maslin, Olegario Messier, RN Phone Number: 11/01/2022, 10:40 AM  Clinical Narrative:  Attempted TC to [name](son)-vm full. TC [name](mother) informed of Insurance denial continued stay;informed of [name] decline to accept;informed to choose another ST SNF facility.List left in rm however [name] already has the ST SNF list from prior note.[name] voiced that she is doing the best she can to asst since her son([name]) & herself are the primary decision makers for patient. [name] very helpful & will contact [name] to asst in choice of ST SNF facility from bed offers. Await choice.MD/Team updated. -10:58a-[name] chose [name] will have their team to initiate auth. Await auth. -2:44p-[facility] declined bed offer;[name](mother) chose [facility] rep [name] will initiate auth bed available Monday if auth.[facility] rep [name] awaiting PT visit prior initiating auth. Team updated.   Expected Discharge Plan: Skilled Nursing Facility Barriers to Discharge: Other (must enter comment) (Insurance denial continued stay 8/16;awaiting ST SNF choice)  Expected Discharge Plan and Services     Post Acute Care Choice: Skilled Nursing Facility Living arrangements for the past 2 months: Single Family Home                                       Social Determinants of Health (SDOH) Interventions SDOH Screenings   Food Insecurity: No Food Insecurity (10/14/2022)  Housing: Low Risk  (10/14/2022)  Transportation Needs: No Transportation Needs (10/14/2022)  Utilities: Not At Risk (10/14/2022)  Depression (PHQ2-9): Medium Risk (07/12/2022)  Tobacco Use: Medium Risk (10/14/2022)    Readmission Risk Interventions    10/08/2022    4:55 PM 09/24/2022    3:55 PM 09/07/2022    2:44 PM  Readmission Risk Prevention Plan  Transportation  Screening Complete Complete Complete  Medication Review Oceanographer) Complete Complete Complete  PCP or Specialist appointment within 3-5 days of discharge Complete Complete Complete  HRI or Home Care Consult Complete Complete Complete  SW Recovery Care/Counseling Consult Complete Complete Complete  Palliative Care Screening Not Applicable Not Applicable Not Applicable  Skilled Nursing Facility Not Applicable Not Applicable Not Applicable

## 2022-11-01 NOTE — Progress Notes (Signed)
PT Cancellation Note  Patient Details Name: Carolyn Hooper MRN: [id number] DOB: 1960/04/08   Cancelled Treatment:     Pt was given pain meds earlier and now in a deep sleep un aroused with room dark.  Pt has been evaluated with rec for SNF.  PT staff to attempt to see another day as schedule permits.   Armando Reichert 11/01/2022, 3:48 PM

## 2022-11-01 NOTE — Progress Notes (Signed)
Called to the room to assess pt for seizure activity. Upon assessment pt told this RN she was having a Seizure. She was verbally speaking to this RN. No seizure activity noted. Pt the told this RN she was having pain and not a seizure. Noted that pt just got PO ativan and IV dil @1755 . Mother at the bedside telling this RN that the doctor told her(the pateint) she could have medicine every hour if she need it. Will discuss with RN caring for the patient and the CN

## 2022-11-02 DIAGNOSIS — R569 Unspecified convulsions: Secondary | ICD-10-CM | POA: Diagnosis not present

## 2022-11-02 LAB — GLUCOSE, CAPILLARY
Glucose-Capillary: 150 mg/dL — ABNORMAL HIGH (ref 70–99)
Glucose-Capillary: 191 mg/dL — ABNORMAL HIGH (ref 70–99)
Glucose-Capillary: 125 mg/dL — ABNORMAL HIGH (ref 70–99)
Glucose-Capillary: 165 mg/dL — ABNORMAL HIGH (ref 70–99)

## 2022-11-02 MED ORDER — ZINC OXIDE 40 % EX OINT
TOPICAL_OINTMENT | CUTANEOUS | Status: DC | PRN
  Filled 2022-11-02: qty 57

## 2022-11-02 NOTE — Progress Notes (Signed)
PROGRESS NOTE    Carolyn Hooper  ZOX:[id number] DOB: Sep 04, 1960 DOA: 10/13/2022 PCP: [name], FNP    62 yrs old female with medical history significant of hidradenitis suppurativa complicated by abscess, anxiety, type 2 diabetes, hyperinsulinemia, hypertension, gestational preeclampsia, auditory neuroma, history of pancreatitis, vertigo, seizures, history of lung cancer,  metastatic to brain who is brought to the emergency department by a relative after having seizures for the past 2 days. She describes her being unable to talk and fast blinking of her eyelids.  Patient also continued to complaining about abdominal pain from pancreatitis. Pertinent  labs include lipase 297, CT head showing progression of left frontal metastasis associated with vasogenic edema since 2 weeks ago.  Mild new mass effect on the left lateral ventricle but no midline shift or other complicating features.  Patient is admitted for further evaluation and started on Decadron as per Dr. Liana Gerold recommendation.  Hospital course was complicated by positive Candida and Streptococcus viridans.  CT scan showed pancreatic infected pseudocyst versus abscess.  Patient was seen by infectious disease and GI.  No further intervention per GI at this time.  ID recommended fluconazole and Levaquin until November 29, 2022 until outpatient ID follow-up.  PT/OT has recommended SNF.     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  Discussed case with neuro-oncology.   Continue Decadron and Keppra     Abdominal pain secondary to acute pancreatitis: Pain control, antiemetics, PPI.  Continue pancreatic enzymes   Pancreatic pseudocyst, Infected: Positive for Candida and Streptococcus viridans.  CT scan showed evidence of 28X 21 mm fluid collection in the pancreatic body and in the stomach concerning for infected pseudocyst versus abscess.  This is not amenable to drainage per IR.  Holding off on any endoscopic evaluation per GI.  ID recommending fluconazole 800 mg daily and Levaquin 500 mg daily until ID visit on 9/13   Type 2 diabetes with hyperglycemia: Last HbA1c was 8.4% on 09/04/2022. Adjust Semglee and Premeal insulin as necessary  Essential hypertension, uncontrolled - Norvasc 5 mg orally daily.  Adjust as necessary.  IV as needed   GAD (generalized anxiety disorder) Continue Klonopin 0.5 mg every 12 hours as needed.   GERD (gastroesophageal reflux disease) Continue pantoprazole 40 mg daily. Antiacid, H2 blocker as needed.   Hypokalemia Replaced.  Resolved.   Suicidal thoughts: Patient has told staff that she has thoughts about hurting herself.   Psychiatry was consulted and found to be not a suicidal risk, psych signed off   Goals of care: Palliative Care is following Transferred to rehab with palliative care services to follow the patient.   Afib -Currently rate controlled.  Holding off on anticoagulation due to brain mets which is high risk for bleeding   PT/OT = SNF with palliative care services to follow             Diet Orders (From admission, onward)     Start     Ordered   10/25/22 1825  Diet regular Room service appropriate? Yes; Fluid consistency: Thin  Diet effective now       Question Answer Comment  Room service appropriate? Yes   Fluid consistency: Thin      10/25/22 1824            Subjective: Overnight concerns of seizures but patient was talking through it.  I do not believe this was a  seizure. No other complaints.   Examination:  General exam: Appears calm and comfortable  Respiratory system: Clear to auscultation. Respiratory effort normal. Cardiovascular system: S1 & S2 heard, RRR. No JVD, murmurs, rubs, gallops or clicks. No pedal edema. Gastrointestinal system: Abdomen is nondistended, soft and nontender. No organomegaly or masses felt. Normal bowel sounds heard. Central nervous system: Alert and oriented. No focal neurological deficits. Extremities: Symmetric 5 x 5 power. Skin: No rashes, lesions or ulcers Psychiatry: Poor judgment and insight.  Objective: Vitals:   11/01/22 0902 11/01/22 1500 11/01/22 1948 11/02/22 0620  BP: (!) 160/62 (!) 176/83 (!) 142/73 135/64  Pulse: 86 81 72 68  Resp: 18 18 16 18   Temp: 97.6 F (36.4 C) (!) 97.5 F (36.4 C) 97.8 F (36.6 C) 99.1 F (37.3 C)  TempSrc: Oral Oral Oral Oral  SpO2: 95% 96% 94% 93%  Weight:      Height:        Intake/Output Summary (Last 24 hours) at 11/02/2022 1158 Last data filed at 11/02/2022 0719 Gross per 24 hour  Intake 2784.53 ml  Output 1700 ml  Net 1084.53 ml   Filed Weights   10/13/22 0429  Weight: 54.4 kg    Scheduled Meds:  amLODipine  5 mg Oral Daily   antiseptic oral rinse  15 mL Mouth Rinse BID   Chlorhexidine Gluconate Cloth  6 each Topical Daily   clonazepam  0.5 mg Oral BID   dexamethasone (DECADRON) injection  4 mg Intravenous Q6H   docusate sodium  100 mg Oral BID   feeding supplement  237 mL Oral BID BM   fluconazole  800 mg Oral Daily   folic acid  1 mg Oral Daily   insulin aspart  0-15 Units Subcutaneous TID WC   insulin aspart  5 Units Subcutaneous TID WC   insulin glargine-yfgn  20 Units Subcutaneous Daily   levETIRAcetam  1,000 mg Oral BID   levofloxacin  500 mg Oral Daily   lipase/protease/amylase  72,000 Units Oral TID WC   pantoprazole  40 mg Oral Daily   polyethylene glycol  17 g Oral BID   ramelteon  8 mg Oral QHS   senna-docusate  1 tablet Oral BID    sodium chloride flush  10-40 mL Intracatheter Q12H   Continuous Infusions:  lactated ringers 50 mL/hr at 11/01/22 2129    Nutritional status     Body mass index is 18.25 kg/m.  Data Reviewed:   CBC: Recent Labs  Lab 10/30/22 0346  WBC 15.1*  HGB 12.6  HCT 39.0  MCV 97.0  PLT 101*   Basic Metabolic Panel: Recent Labs  Lab 10/30/22 0346  NA 135  K 3.6  CL 100  CO2 26  GLUCOSE 182*  BUN 20  CREATININE 0.64  CALCIUM 8.4*   GFR: Estimated Creatinine Clearance: 62.6 mL/min (by C-G formula based on SCr of 0.64 mg/dL). Liver Function Tests: Recent Labs  Lab 10/30/22 0346  AST 22  ALT 35  ALKPHOS 118  BILITOT 0.9  PROT 5.1*  ALBUMIN 2.4*   No results for input(s): "  LIPASE", "AMYLASE" in the last 168 hours. No results for input(s): "AMMONIA" in the last 168 hours. Coagulation Profile: No results for input(s): "INR", "PROTIME" in the last 168 hours. Cardiac Enzymes: No results for input(s): "CKTOTAL", "CKMB", "CKMBINDEX", "TROPONINI" in the last 168 hours. BNP (last 3 results) No results for input(s): "PROBNP" in the last 8760 hours. HbA1C: No results for input(s): "HGBA1C" in the last 72 hours. CBG: Recent Labs  Lab 11/01/22 0815 11/01/22 1129 11/01/22 1634 11/01/22 2035 11/02/22 0738  GLUCAP 238* 265* 188* 155* 150*   Lipid Profile: No results for input(s): "CHOL", "HDL", "LDLCALC", "TRIG", "CHOLHDL", "LDLDIRECT" in the last 72 hours. Thyroid Function Tests: No results for input(s): "TSH", "T4TOTAL", "FREET4", "T3FREE", "THYROIDAB" in the last 72 hours. Anemia Panel: No results for input(s): "VITAMINB12", "FOLATE", "FERRITIN", "TIBC", "IRON", "RETICCTPCT" in the last 72 hours. Sepsis Labs: No results for input(s): "PROCALCITON", "LATICACIDVEN" in the last 168 hours.  No results found for this or any previous visit (from the past 240 hour(s)).       Radiology Studies: No results found.         LOS: 19 days   Time spent= 35  mins    Miguel Rota, MD [facility]  If 7PM-7AM, please contact night-coverage  11/02/2022, 11:58 AM

## 2022-11-02 NOTE — TOC Progression Note (Addendum)
Transition of Care [facility]) - Progression Note    Patient Details  Name: NAVIE SALVESEN MRN: [id number] Date of Birth: 1960-07-05  Transition of Care [facility]) CM/SW Contact  Jailon Schaible, Olegario Messier, RN Phone Number: 11/02/2022, 3:11 PM  Clinical Narrative:  [facility] no beds available until Monday-facility will start auth Monday. PT to see patient in am. MD notified.     Expected Discharge Plan: Skilled Nursing Facility Barriers to Discharge: Other (must enter comment) (Insurance denial continued stay 8/16;awaiting ST SNF choice)  Expected Discharge Plan and Services     Post Acute Care Choice: Skilled Nursing Facility Living arrangements for the past 2 months: Single Family Home                                       Social Determinants of Health (SDOH) Interventions SDOH Screenings   Food Insecurity: No Food Insecurity (10/14/2022)  Housing: Low Risk  (10/14/2022)  Transportation Needs: No Transportation Needs (10/14/2022)  Utilities: Not At Risk (10/14/2022)  Depression (PHQ2-9): Medium Risk (07/12/2022)  Tobacco Use: Medium Risk (10/14/2022)    Readmission Risk Interventions    10/08/2022    4:55 PM 09/24/2022    3:55 PM 09/07/2022    2:44 PM  Readmission Risk Prevention Plan  Transportation Screening Complete Complete Complete  Medication Review [employer]) Complete Complete Complete  PCP or Specialist appointment within 3-5 days of discharge Complete Complete Complete  HRI or Home Care Consult Complete Complete Complete  SW Recovery Care/Counseling Consult Complete Complete Complete  Palliative Care Screening Not Applicable Not Applicable Not Applicable  Skilled Nursing Facility Not Applicable Not Applicable Not Applicable

## 2022-11-02 NOTE — Plan of Care (Signed)

## 2022-11-03 DIAGNOSIS — R569 Unspecified convulsions: Secondary | ICD-10-CM | POA: Diagnosis not present

## 2022-11-03 LAB — GLUCOSE, CAPILLARY
Glucose-Capillary: 241 mg/dL — ABNORMAL HIGH (ref 70–99)
Glucose-Capillary: 232 mg/dL — ABNORMAL HIGH (ref 70–99)
Glucose-Capillary: 161 mg/dL — ABNORMAL HIGH (ref 70–99)
Glucose-Capillary: 198 mg/dL — ABNORMAL HIGH (ref 70–99)

## 2022-11-03 MED ORDER — LORAZEPAM 2 MG/ML IJ SOLN
0.5000 mg | INTRAMUSCULAR | Status: DC | PRN
  Administered 2022-11-03 – 2022-11-04 (×2): 0.5 mg via INTRAVENOUS
  Filled 2022-11-03: qty 1

## 2022-11-03 NOTE — Plan of Care (Signed)
  Problem: Health Behavior/Discharge Planning: Goal: Ability to identify and utilize available resources and services will improve Outcome: Progressing Goal: Ability to manage health-related needs will improve Outcome: Progressing   Problem: Tissue Perfusion: Goal: Adequacy of tissue perfusion will improve Outcome: Progressing   Problem: Education: Goal: Knowledge of General Education information will improve Description: Including pain rating scale, medication(s)/side effects and non-pharmacologic comfort measures Outcome: Progressing

## 2022-11-03 NOTE — Progress Notes (Signed)
Occupational Therapy Treatment Patient Details Name: Carolyn Hooper MRN: [id number] DOB: [birth year] Today's Date: 11/03/2022   History of present illness Pt is a 62 y/o female presenting to ED on 10/13/2022 due to reports of seizure like activity with fluttering eye lids and difficulty in speech. Pt indicates 2 seizures in 2 days, fall and poor PO intake with N and V.  Pt d/c from hospital following admission 7/20-7/23/2024 in which CT revealing episode of acute pancreatitis with new small fluid collection and found to be hypokalemic. Pt seen by GI and recommendation for conservative measures. Pt provided with IV dilaudid and pain improved.  Pt hospitalized 7/6-7/15/2024 with abdominal pain and hospitalized 6/19-6/27 with epigastric pain. CT abdomen revealed enhancing fluid collection in stomach. S/p cystogastrostomy on 7/11. MRI of brain on 09/29/2022 reveals stable size of 15 x 13 x 14 mm peripherally enhancing metastasis in the posterior left frontal lobe, stable punctate enhancing metastasis in the medial right frontal lobe. Pt PMH includes but is not limited to:  non-small cell lung CA, brain metastasis, focal seizures, DM and pt currently on immunotherapy/chemotherapy.   OT comments  Patient was agreeable to attempt standing with STEDY with +3 on this date. Patient continues to have increased word salad speech making it more difficult to understand what patient is requesting. Patient continues to also have increased fear of falling impacting participation and safety with attempts at transitions. Patient will need 24/7 caregiver support in the next level of care. Patients rehab potential is guarded at this time with changes in status. Patient's discharge plan remains appropriate at this time. OT will continue to follow acutely.        If plan is discharge home, recommend the following:  A little help with walking and/or transfers;A little help with bathing/dressing/bathroom;Assistance with  cooking/housework;Help with stairs or ramp for entrance;Direct supervision/assist for medications management;Direct supervision/assist for financial management;Assist for transportation   Equipment Recommendations  None recommended by OT       Precautions / Restrictions Precautions Precautions: Fall Restrictions Weight Bearing Restrictions: No       Mobility Bed Mobility Overal bed mobility: Needs Assistance Bed Mobility: Rolling, Sidelying to Sit, Supine to Sit   Sidelying to sit: +2 for physical assistance, +2 for safety/equipment, Max assist, Used rails     Sit to sidelying: +2 for physical assistance, Total assist, +2 for safety/equipment General bed mobility comments: Increased time and max A for trunk to sit. To return to supine +3 with trunk and legs to guide back into bed patients increased fear of falling impacting patients transition back into bed. multimodal cues provided            ADL either performed or assessed with clinical judgement   ADL Overall ADL's : Needs assistance/impaired     General ADL Comments: Patient was lying in bed during session. patient noted to have hard time finding words and communicating with therapists leaving patient frustrated during session. patient was +2 for supine to sit on edge of bed with increased time and cues for proper hand and foot positioning. patient needed +3 to attempt to stand up in STEDY with patient able to clear bottom off the bed on this date. patient able to engage in standing x2 on this date. patient needed +3 for transition back to bed after standing with patient increased fear of falling and patient opushing forwards instead of guiding back into supine in bed.      Cognition Arousal: Alert Behavior During Therapy: Flat  affect Overall Cognitive Status: Impaired/Different from baseline Area of Impairment: Following commands       Following Commands: Follows one step commands with increased time     Problem  Solving: Slow processing General Comments: Responses still slow. Extensive time spent on discussion with pt on today-this tends to impact activity time during session. patient with word salad at times during session.patient clearly able to get prayers out but not many thoughts during session.                   Pertinent Vitals/ Pain       Pain Assessment Pain Assessment: Faces Faces Pain Scale: No hurt         Frequency  Min 1X/week        Progress Toward Goals  OT Goals(current goals can now be found in the care plan section)  Progress towards OT goals: Not progressing toward goals - comment (+3 to stand. goals were downgraded today.)  ADL Goals Pt Will Transfer to Toilet: with max assist;with +2 assist Pt Will Perform Toileting - Clothing Manipulation and hygiene: with total assist;bed level Additional ADL Goal #1: patient to participate in sitting EOB with no LOB and engage in grooming tasks with CGA to prevent learned helplessness.  Plan      Co-evaluation    PT/OT/SLP Co-Evaluation/Treatment: Yes Reason for Co-Treatment: Complexity of the patient's impairments (multi-system involvement);For patient/therapist safety PT goals addressed during session: Mobility/safety with mobility OT goals addressed during session: ADL's and self-care      AM-PAC OT "6 Clicks" Daily Activity     Outcome Measure   Help from another person eating meals?: A Little Help from another person taking care of personal grooming?: A Little Help from another person toileting, which includes using toliet, bedpan, or urinal?: Total Help from another person bathing (including washing, rinsing, drying)?: Total Help from another person to put on and taking off regular upper body clothing?: A Lot Help from another person to put on and taking off regular lower body clothing?: Total 6 Click Score: 11    End of Session Equipment Utilized During Treatment: Other (comment) (STEDY)  OT Visit  Diagnosis: Unsteadiness on feet (R26.81);Other abnormalities of gait and mobility (R26.89);Muscle weakness (generalized) (M62.81)   Activity Tolerance Patient limited by fatigue;Patient limited by pain   Patient Left in bed;with call bell/phone within reach;with bed alarm set   Nurse Communication Other (comment) (patients increased discomfort and difficulty communicating wants/needs)        Time: 1610-9604 OT Time Calculation (min): 51 min  Charges: OT General Charges $OT Visit: 1 Visit OT Treatments $Therapeutic Activity: 23-37 mins  Rosalio Loud, MS Acute Rehabilitation Department Office# [phone]   Selinda Flavin 11/03/2022, 1:26 PM

## 2022-11-03 NOTE — Plan of Care (Signed)

## 2022-11-03 NOTE — Progress Notes (Signed)
PROGRESS NOTE    Carolyn Hooper  UYQ:[id number] DOB: 02-12-1961 DOA: 10/13/2022 PCP: [name], FNP    62 yrs old female with medical history significant of hidradenitis suppurativa complicated by abscess, anxiety, type 2 diabetes, hyperinsulinemia, hypertension, gestational preeclampsia, auditory neuroma, history of pancreatitis, vertigo, seizures, history of lung cancer,  metastatic to brain who is brought to the emergency department by a relative after having seizures for the past 2 days. She describes her being unable to talk and fast blinking of her eyelids.  Patient also continued to complaining about abdominal pain from pancreatitis. Pertinent  labs include lipase 297, CT head showing progression of left frontal metastasis associated with vasogenic edema since 2 weeks ago.  Mild new mass effect on the left lateral ventricle but no midline shift or other complicating features.  Patient is admitted for further evaluation and started on Decadron as per Dr. Liana Gerold recommendation.  Hospital course was complicated by positive Candida and Streptococcus viridans.  CT scan showed pancreatic infected pseudocyst versus abscess.  Patient was seen by infectious disease and GI.  No further intervention per GI at this time.  ID recommended fluconazole and Levaquin until November 29, 2022 until outpatient ID follow-up.  PT/OT has recommended SNF.     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  Discussed case with neuro-oncology.   Continue Decadron and Keppra     Abdominal pain secondary to acute pancreatitis: Pain control, antiemetics, PPI.  Continue pancreatic enzymes   Pancreatic pseudocyst, Infected: Positive for Candida and Streptococcus viridans.  CT scan showed evidence of 28X 21 mm fluid collection in the pancreatic body and in the stomach concerning for infected pseudocyst versus abscess.  This is not amenable to drainage per IR.  Holding off on any endoscopic evaluation per GI.  ID recommending fluconazole 800 mg daily and Levaquin 500 mg daily until ID visit on 9/13   Type 2 diabetes with hyperglycemia: Last HbA1c was 8.4% on 09/04/2022. Adjust Semglee and Premeal insulin as necessary  Essential hypertension, uncontrolled - Norvasc 5 mg orally daily.  Adjust as necessary.  IV as needed   GAD (generalized anxiety disorder) Continue Klonopin 0.5 mg every 12 hours as needed.   GERD (gastroesophageal reflux disease) Continue pantoprazole 40 mg daily. Antiacid, H2 blocker as needed.   Hypokalemia Replaced.  Resolved.   Suicidal thoughts: Patient has told staff that she has thoughts about hurting herself.   Psychiatry was consulted and found to be not a suicidal risk, psych signed off   Goals of care: Palliative Care is following Transferred to rehab with palliative care services to follow the patient.   Afib -Currently rate controlled.  Holding off on anticoagulation due to brain mets which is high risk for bleeding   PT/OT = SNF with palliative care services to follow             Diet Orders (From admission, onward)     Start     Ordered   10/25/22 1825  Diet regular Room service appropriate? Yes; Fluid consistency: Thin  Diet effective now       Question Answer Comment  Room service appropriate? Yes   Fluid consistency: Thin      10/25/22 1824            Subjective: Doing ok no complaints.    Examination:  General exam: Appears calm and comfortable  Respiratory  system: Clear to auscultation. Respiratory effort normal. Cardiovascular system: S1 & S2 heard, RRR. No JVD, murmurs, rubs, gallops or clicks. No pedal edema. Gastrointestinal system: Abdomen is nondistended, soft and nontender. No organomegaly or masses felt. Normal bowel sounds heard. Central nervous system: Alert and oriented. No focal neurological deficits. Extremities: Symmetric 5 x 5 power. Skin: No rashes, lesions or ulcers Psychiatry: Judgement and insight appear Poor  Objective: Vitals:   11/02/22 0620 11/02/22 1105 11/02/22 2122 11/03/22 0513  BP: 135/64 (!) 144/68 (!) 168/76 (!) 145/79  Pulse: 68 66 82 73  Resp: 18  18 15   Temp: 99.1 F (37.3 C)  98.4 F (36.9 C) 97.8 F (36.6 C)  TempSrc: Oral  Oral Oral  SpO2: 93%  97% 93%  Weight:      Height:        Intake/Output Summary (Last 24 hours) at 11/03/2022 1158 Last data filed at 11/03/2022 0556 Gross per 24 hour  Intake 1571.88 ml  Output 700 ml  Net 871.88 ml   Filed Weights   10/13/22 0429  Weight: 54.4 kg    Scheduled Meds:  amLODipine  5 mg Oral Daily   antiseptic oral rinse  15 mL Mouth Rinse BID   Chlorhexidine Gluconate Cloth  6 each Topical Daily   clonazepam  0.5 mg Oral BID   dexamethasone (DECADRON) injection  4 mg Intravenous Q6H   docusate sodium  100 mg Oral BID   feeding supplement  237 mL Oral BID BM   fluconazole  800 mg Oral Daily   folic acid  1 mg Oral Daily   insulin aspart  0-15 Units Subcutaneous TID WC   insulin aspart  5 Units Subcutaneous TID WC   insulin glargine-yfgn  20 Units Subcutaneous Daily   levETIRAcetam  1,000 mg Oral BID   levofloxacin  500 mg Oral Daily   lipase/protease/amylase  72,000 Units Oral TID WC   pantoprazole  40 mg Oral Daily   polyethylene glycol  17 g Oral BID   ramelteon  8 mg Oral QHS   senna-docusate  1 tablet Oral BID   sodium chloride flush  10-40 mL Intracatheter Q12H   Continuous Infusions:  lactated ringers 50 mL/hr at 11/02/22 1651     Nutritional status     Body mass index is 18.25 kg/m.  Data Reviewed:   CBC: Recent Labs  Lab 10/30/22 0346  WBC 15.1*  HGB 12.6  HCT 39.0  MCV 97.0  PLT 101*   Basic Metabolic Panel: Recent Labs  Lab 10/30/22 0346  NA 135  K 3.6  CL 100  CO2 26  GLUCOSE 182*  BUN 20  CREATININE 0.64  CALCIUM 8.4*   GFR: Estimated Creatinine Clearance: 62.6 mL/min (by C-G formula based on SCr of 0.64 mg/dL). Liver Function Tests: Recent Labs  Lab 10/30/22 0346  AST 22  ALT 35  ALKPHOS 118  BILITOT 0.9  PROT 5.1*  ALBUMIN 2.4*   No results for input(s): "LIPASE", "AMYLASE" in the last 168 hours. No results for input(s): "AMMONIA" in the last 168 hours. Coagulation Profile: No  results for input(s): "INR", "PROTIME" in the last 168 hours. Cardiac Enzymes: No results for input(s): "CKTOTAL", "CKMB", "CKMBINDEX", "TROPONINI" in the last 168 hours. BNP (last 3 results) No results for input(s): "PROBNP" in the last 8760 hours. HbA1C: No results for input(s): "HGBA1C" in the last 72 hours. CBG: Recent Labs  Lab 11/02/22 1155 11/02/22 1727 11/02/22 2125 11/03/22 0724 11/03/22 1135  GLUCAP 191* 125* 165* 241* 232*   Lipid Profile: No results for input(s): "CHOL", "HDL", "LDLCALC", "TRIG", "CHOLHDL", "LDLDIRECT" in the last 72 hours. Thyroid Function Tests: No results for input(s): "TSH", "T4TOTAL", "FREET4", "T3FREE", "THYROIDAB" in the last 72 hours. Anemia Panel: No results for input(s): "VITAMINB12", "FOLATE", "FERRITIN", "TIBC", "IRON", "RETICCTPCT" in the last 72 hours. Sepsis Labs: No results for input(s): "PROCALCITON", "LATICACIDVEN" in the last 168 hours.  No results found for this or any previous visit (from the past 240 hour(s)).       Radiology Studies: No results found.         LOS: 20 days   Time spent= 35 mins    Miguel Rota, MD [facility]  If 7PM-7AM, please contact night-coverage  11/03/2022, 11:58 AM

## 2022-11-03 NOTE — Progress Notes (Signed)
Physical Therapy Treatment Patient Details Name: Carolyn Hooper MRN: [id number] DOB: 1961-01-17 Today's Date: 11/03/2022   History of Present Illness Pt is a 62 y/o female presenting to ED on 10/13/2022 due to reports of seizure like activity with fluttering eye lids and difficulty in speech. Pt indicates 2 seizures in 2 days, fall and poor PO intake with N and V.  Pt d/c from hospital following admission 7/20-7/23/2024 in which CT revealing episode of acute pancreatitis with new small fluid collection and found to be hypokalemic. Pt seen by GI and recommendation for conservative measures. Pt provided with IV dilaudid and pain improved.  Pt hospitalized 7/6-7/15/2024 with abdominal pain and hospitalized 6/19-6/27 with epigastric pain. CT abdomen revealed enhancing fluid collection in stomach. S/p cystogastrostomy on 7/11. MRI of brain on 09/29/2022 reveals stable size of 15 x 13 x 14 mm peripherally enhancing metastasis in the posterior left frontal lobe, stable punctate enhancing metastasis in the medial right frontal lobe. Pt PMH includes but is not limited to:  non-small cell lung CA, brain metastasis, focal seizures, DM and pt currently on immunotherapy/chemotherapy.    PT Comments  Pt requiring increased time due to cognition.  Pt presenting with significant word salad today.  Pt also fearful of falling. Pt assisted with attempts to perform standing x2 utilizing stedy equipment for safety.  Pt occasionally able to state her needs with simple phrases.  Attempted to keep to yes/no questions (to ensure clarifying pt needs) however pt attempting to explain or talk with long word salad sentences.  Pt attempting to cooperate and participate as able. Continue to recommend current d/c recommendations.  Patient will benefit from continued inpatient follow up therapy, <3 hours/day    If plan is discharge home, recommend the following: Two people to help with walking and/or transfers;Two people to help with  bathing/dressing/bathroom   Can travel by private vehicle     No  Equipment Recommendations  None recommended by PT    Recommendations for Other Services       Precautions / Restrictions Precautions Precautions: Fall Restrictions Weight Bearing Restrictions: No     Mobility  Bed Mobility Overal bed mobility: Needs Assistance Bed Mobility: Rolling, Sidelying to Sit, Supine to Sit   Sidelying to sit: +2 for physical assistance, +2 for safety/equipment, Max assist, Used rails     Sit to sidelying: +2 for physical assistance, Total assist, +2 for safety/equipment General bed mobility comments: Increased time and max A for trunk to sit. To return to supine +3 with trunk and legs to guide back into bed patients increased fear of falling impacting patients transition back into bed. multimodal cues provided    Transfers Overall transfer level: Needs assistance   Transfers: Sit to/from Stand Sit to Stand: Max assist, +2 physical assistance, From elevated surface           General transfer comment: utilized stedy for safety and due to pt's fear of falling, requiring assist for positioning LEs on equipment, pt able to reach and place hands on stedy bar; requiring assist and multimodal cues for rise and controlling descent (utilized 3 people for safety and due to pt's fear of falling); pt agreeable to perform standing twice with seated break inbetween, reports fatigue limiting ability to continue and requested to return to bed Transfer via Lift Equipment: Stedy  Ambulation/Gait                   Stairs  Wheelchair Mobility     Tilt Bed    Modified Rankin (Stroke Patients Only)       Balance                                            Cognition Arousal: Alert Behavior During Therapy: Flat affect Overall Cognitive Status: Impaired/Different from baseline Area of Impairment: Following commands                        Following Commands: Follows one step commands with increased time     Problem Solving: Slow processing General Comments: Responses still slow. Extensive time spent on discussion with pt on today-this tends to impact activity time during session. patient with word salad at times during session.  patient clearly able to get prayers out but not many thoughts during session.        Exercises      General Comments        Pertinent Vitals/Pain Pain Assessment Pain Assessment: Faces Faces Pain Scale: No hurt Pain Intervention(s): Monitored during session, Repositioned    Home Living                          Prior Function            PT Goals (current goals can now be found in the care plan section) Acute Rehab PT Goals PT Goal Formulation: With patient Time For Goal Achievement: 11/11/22 Potential to Achieve Goals: Poor Progress towards PT goals: Progressing toward goals (variable, cognition impacting time)    Frequency    Min 1X/week      PT Plan Current plan remains appropriate    Co-evaluation PT/OT/SLP Co-Evaluation/Treatment: Yes Reason for Co-Treatment: Complexity of the patient's impairments (multi-system involvement);For patient/therapist safety PT goals addressed during session: Mobility/safety with mobility OT goals addressed during session: ADL's and self-care      AM-PAC PT "6 Clicks" Mobility   Outcome Measure  Help needed turning from your back to your side while in a flat bed without using bedrails?: A Lot Help needed moving from lying on your back to sitting on the side of a flat bed without using bedrails?: A Lot Help needed moving to and from a bed to a chair (including a wheelchair)?: Total Help needed standing up from a chair using your arms (e.g., wheelchair or bedside chair)?: Total Help needed to walk in hospital room?: Total Help needed climbing 3-5 steps with a railing? : Total 6 Click Score: 8    End of Session Equipment  Utilized During Treatment: Gait belt Activity Tolerance: Patient tolerated treatment well Patient left: in bed;with call bell/phone within reach;with nursing/sitter in room   PT Visit Diagnosis: Muscle weakness (generalized) (M62.81);Other abnormalities of gait and mobility (R26.89)     Time: 1610-9604 PT Time Calculation (min) (ACUTE ONLY): 51 min  Charges:    $Therapeutic Activity: 8-22 mins PT General Charges $$ ACUTE PT VISIT: 1 Visit                     Paulino Door, DPT Physical Therapist Acute Rehabilitation Services Office: [phone]    Carolyn Hooper 11/03/2022, 1:34 PM

## 2022-11-04 DIAGNOSIS — K668 Other specified disorders of peritoneum: Secondary | ICD-10-CM | POA: Diagnosis not present

## 2022-11-04 DIAGNOSIS — G893 Neoplasm related pain (acute) (chronic): Secondary | ICD-10-CM | POA: Diagnosis not present

## 2022-11-04 DIAGNOSIS — C7931 Secondary malignant neoplasm of brain: Secondary | ICD-10-CM | POA: Diagnosis not present

## 2022-11-04 DIAGNOSIS — Z7401 Bed confinement status: Secondary | ICD-10-CM | POA: Diagnosis not present

## 2022-11-04 DIAGNOSIS — R55 Syncope and collapse: Secondary | ICD-10-CM | POA: Diagnosis not present

## 2022-11-04 DIAGNOSIS — K8592 Acute pancreatitis with infected necrosis, unspecified: Secondary | ICD-10-CM | POA: Diagnosis not present

## 2022-11-04 DIAGNOSIS — Z515 Encounter for palliative care: Secondary | ICD-10-CM | POA: Diagnosis not present

## 2022-11-04 DIAGNOSIS — C3490 Malignant neoplasm of unspecified part of unspecified bronchus or lung: Secondary | ICD-10-CM | POA: Diagnosis not present

## 2022-11-04 DIAGNOSIS — R5381 Other malaise: Secondary | ICD-10-CM | POA: Diagnosis not present

## 2022-11-04 DIAGNOSIS — F419 Anxiety disorder, unspecified: Secondary | ICD-10-CM | POA: Diagnosis not present

## 2022-11-04 DIAGNOSIS — K219 Gastro-esophageal reflux disease without esophagitis: Secondary | ICD-10-CM | POA: Diagnosis not present

## 2022-11-04 DIAGNOSIS — K858 Other acute pancreatitis without necrosis or infection: Secondary | ICD-10-CM | POA: Diagnosis not present

## 2022-11-04 DIAGNOSIS — G40909 Epilepsy, unspecified, not intractable, without status epilepticus: Secondary | ICD-10-CM | POA: Diagnosis not present

## 2022-11-04 DIAGNOSIS — J189 Pneumonia, unspecified organism: Secondary | ICD-10-CM | POA: Diagnosis not present

## 2022-11-04 DIAGNOSIS — E119 Type 2 diabetes mellitus without complications: Secondary | ICD-10-CM | POA: Diagnosis not present

## 2022-11-04 DIAGNOSIS — I639 Cerebral infarction, unspecified: Secondary | ICD-10-CM | POA: Diagnosis not present

## 2022-11-04 DIAGNOSIS — R1084 Generalized abdominal pain: Secondary | ICD-10-CM | POA: Diagnosis not present

## 2022-11-04 DIAGNOSIS — D333 Benign neoplasm of cranial nerves: Secondary | ICD-10-CM | POA: Diagnosis not present

## 2022-11-04 DIAGNOSIS — E876 Hypokalemia: Secondary | ICD-10-CM | POA: Diagnosis not present

## 2022-11-04 DIAGNOSIS — J984 Other disorders of lung: Secondary | ICD-10-CM | POA: Diagnosis not present

## 2022-11-04 DIAGNOSIS — E1159 Type 2 diabetes mellitus with other circulatory complications: Secondary | ICD-10-CM | POA: Diagnosis not present

## 2022-11-04 DIAGNOSIS — K859 Acute pancreatitis without necrosis or infection, unspecified: Secondary | ICD-10-CM | POA: Diagnosis not present

## 2022-11-04 DIAGNOSIS — Z66 Do not resuscitate: Secondary | ICD-10-CM | POA: Diagnosis not present

## 2022-11-04 DIAGNOSIS — R531 Weakness: Secondary | ICD-10-CM | POA: Diagnosis not present

## 2022-11-04 DIAGNOSIS — G9340 Encephalopathy, unspecified: Secondary | ICD-10-CM | POA: Diagnosis not present

## 2022-11-04 DIAGNOSIS — I10 Essential (primary) hypertension: Secondary | ICD-10-CM | POA: Diagnosis not present

## 2022-11-04 DIAGNOSIS — R42 Dizziness and giddiness: Secondary | ICD-10-CM | POA: Diagnosis not present

## 2022-11-04 DIAGNOSIS — D6859 Other primary thrombophilia: Secondary | ICD-10-CM | POA: Diagnosis not present

## 2022-11-04 DIAGNOSIS — Z452 Encounter for adjustment and management of vascular access device: Secondary | ICD-10-CM | POA: Diagnosis not present

## 2022-11-04 DIAGNOSIS — J90 Pleural effusion, not elsewhere classified: Secondary | ICD-10-CM | POA: Diagnosis not present

## 2022-11-04 DIAGNOSIS — R45851 Suicidal ideations: Secondary | ICD-10-CM | POA: Diagnosis not present

## 2022-11-04 DIAGNOSIS — F411 Generalized anxiety disorder: Secondary | ICD-10-CM | POA: Diagnosis not present

## 2022-11-04 DIAGNOSIS — R569 Unspecified convulsions: Secondary | ICD-10-CM | POA: Diagnosis not present

## 2022-11-04 DIAGNOSIS — I2699 Other pulmonary embolism without acute cor pulmonale: Secondary | ICD-10-CM | POA: Diagnosis not present

## 2022-11-04 LAB — GLUCOSE, CAPILLARY
Glucose-Capillary: 243 mg/dL — ABNORMAL HIGH (ref 70–99)
Glucose-Capillary: 238 mg/dL — ABNORMAL HIGH (ref 70–99)
Glucose-Capillary: 207 mg/dL — ABNORMAL HIGH (ref 70–99)

## 2022-11-04 MED ORDER — HEPARIN SOD (PORK) LOCK FLUSH 100 UNIT/ML IV SOLN
500.0000 [IU] | Freq: Once | INTRAVENOUS | Status: AC
  Administered 2022-11-04: 500 [IU] via INTRAVENOUS
  Filled 2022-11-04: qty 5

## 2022-11-04 NOTE — TOC Progression Note (Addendum)
Transition of Care [facility]) - Progression Note    Patient Details  Name: Carolyn Hooper MRN: [id number] Date of Birth: 1960/11/24  Transition of Care [facility] CM/SW Contact  [name], RN Phone Number: 11/04/2022, 9:41 AM  Clinical Narrative:   Call to [facility], admissions coordinator at [facility], left vm with NCM name and phone number for contact, facility to initiate insurance auth request, await determination.   -11:38am Call from [place] with [facility], insurance auth initiated, await auth.    -1:11pm Call received from [place] with [facility], reports authorization received for SNF, bed is available today. RM  [phone]. Nurse call report: [phone], if not answer, call main facility at [phone] and ask for 400 hall nurse. Team notified.   -2:30pm Call from [place] at [place], [place] confirmed pt is transferring today, attending working on dc orders now. Lorene Dy confirmed facility has Palliative Medicine Team through [place].     Expected Discharge Plan: Skilled Nursing Facility Barriers to Discharge: Other (must enter comment) (Insurance denial continued stay 8/16;awaiting ST SNF choice)  Expected Discharge Plan and Services     Post Acute Care Choice: Skilled Nursing Facility Living arrangements for the past 2 months: Single Family Home                                       Social Determinants of Health (SDOH) Interventions SDOH Screenings   Food Insecurity: No Food Insecurity (10/14/2022)  Housing: Low Risk  (10/14/2022)  Transportation Needs: No Transportation Needs (10/14/2022)  Utilities: Not At Risk (10/14/2022)  Depression (PHQ2-9): Medium Risk (07/12/2022)  Tobacco Use: Medium Risk (10/14/2022)    Readmission Risk Interventions    10/08/2022    4:55 PM 09/24/2022    3:55 PM 09/07/2022    2:44 PM  Readmission Risk Prevention Plan  Transportation Screening Complete Complete Complete  Medication Review [employer]) Complete Complete  Complete  PCP or Specialist appointment within 3-5 days of discharge Complete Complete Complete  HRI or Home Care Consult Complete Complete Complete  SW Recovery Care/Counseling Consult Complete Complete Complete  Palliative Care Screening Not Applicable Not Applicable Not Applicable  Skilled Nursing Facility Not Applicable Not Applicable Not Applicable

## 2022-11-04 NOTE — Progress Notes (Signed)
Called report to facility to [facility] nurse. All questions answered.

## 2022-11-04 NOTE — Discharge Summary (Signed)
Physician Discharge Summary  Carolyn Hooper ZOX:[id number] DOB: 1960-11-04 DOA: 10/13/2022  PCP: [name], FNP  Admit date: 10/13/2022 Discharge date: 11/04/2022  Admitted From: Home Disposition:  SNF  Recommendations for Outpatient Follow-up:  Follow up with PCP in 1-2 weeks Please obtain BMP/CBC in one week your next doctors visit.  Outpatient Palliative care referral Cont Keppra and Steroids.  Levaquin and Fluconazole until Infectious Disease Follow up on 9/13   Discharge Condition: Stable CODE STATUS: DNR Diet recommendation: Diabetic.   Brief/Interim Summary: 62 yrs old female with medical history significant of hidradenitis suppurativa complicated by abscess, anxiety, type 2 diabetes, hyperinsulinemia, hypertension, gestational preeclampsia, auditory neuroma, history of pancreatitis, vertigo, seizures, history of lung cancer,  metastatic to brain who is brought to the emergency department by a relative after having seizures for the past 2 days. She describes her being unable to talk and fast blinking of her eyelids.  Patient also continued to complaining about abdominal pain from pancreatitis. Pertinent  labs include lipase 297, CT head showing progression of left frontal metastasis associated with vasogenic edema since 2 weeks ago.  Mild new mass effect on the left lateral ventricle but no midline shift or other complicating features.  Patient is admitted for further evaluation and started on Decadron as per Dr. Liana Gerold recommendation.  Hospital course was complicated by positive Candida and Streptococcus viridans.  CT scan showed pancreatic infected pseudocyst versus abscess.  Patient was seen by infectious disease and GI.  No further intervention per GI at this time.  ID recommended fluconazole and Levaquin until November 29, 2022 until outpatient ID follow-up.  PT/OT has recommended SNF.   Palliative care team should follow patient as outpatient for further pain control, and  continue goals of care discussions.    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  Discussed case with neuro-oncology.  Continue Decadron and Keppra     Abdominal pain secondary to acute pancreatitis: Pain control, antiemetics, PPI.  Continue pancreatic enzymes   Pancreatic pseudocyst, Infected: Positive for Candida and Streptococcus viridans.  CT scan showed evidence of 28X 21 mm fluid collection in the pancreatic body and in the stomach concerning for infected pseudocyst versus abscess.  This is not amenable to drainage per IR.  Holding off on any endoscopic evaluation per GI.  ID recommending fluconazole 800 mg daily and Levaquin 500 mg daily until ID visit on 9/13   Type 2 diabetes with hyperglycemia: Last HbA1c was 8.4% on 09/04/2022. Adjust Semglee and Premeal insulin as necessary  Essential hypertension, uncontrolled - Norvasc 5 mg orally daily.  Adjust as necessary.  IV as needed   GAD (generalized anxiety disorder) Continue Klonopin 0.5 mg every 12 hours as needed.   GERD (gastroesophageal reflux disease) Continue pantoprazole 40 mg daily. Antiacid, H2 blocker as needed.   Hypokalemia Replaced.  Resolved.   Suicidal thoughts: Patient has told staff that she has thoughts about hurting herself.   Psychiatry was consulted and found to be not a suicidal risk,  psych signed off   Goals of care: Palliative Care is following Transferred to rehab with palliative care services to follow the patient.    Afib -Currently rate controlled.  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   Discharge Exam: Vitals:   11/04/22 1100 11/04/22 1300  BP:  (!) 106/57  Pulse:  80  Resp:  18  Temp:  (!) 97.5 F (36.4 C)  SpO2: 97% 97%   Vitals:   11/04/22 0520 11/04/22 0929 11/04/22 1100 11/04/22 1300  BP: 135/64 (!) 156/62  (!) 106/57  Pulse: 65 78  80  Resp: 16 20  18   Temp: (!) 97.5 F (36.4 C) (!) 97.4 F (36.3 C)  (!) 97.5 F (36.4 C)  TempSrc: Oral Oral    SpO2: 96% 98% 97% 97%  Weight:      Height:          Discharge Instructions  Discharge Instructions     Amb Referral to Palliative Care   Complete by: As directed    Clinic Appointment Request   Complete by: Nov 06, 2022    Contact your oncology clinic or infusion center to schedule this appointment.   Infusion Appointment Request   Complete by: Nov 06, 2022    Contact your oncology clinic or infusion center to schedule this appointment.   Lab Appointment Request   Complete by: Nov 06, 2022    Contact your oncology clinic or infusion center to schedule this appointment.      Allergies as of 11/04/2022       Reactions   Adhesive [tape] Dermatitis   Statins Itching, Other (See Comments)   Myalgias, also        Medication List     STOP taking these medications    clonazePAM 0.5 MG  tablet Commonly known as: KLONOPIN Replaced by: clonazePAM 0.5 MG disintegrating tablet   famotidine 20 MG tablet Commonly known as: PEPCID   furosemide 20 MG tablet Commonly known as: LASIX       TAKE these medications    albuterol 108 (90 Base) MCG/ACT inhaler Commonly known as: VENTOLIN HFA Inhale 2 puffs into the lungs every 6 (six) hours as needed.   amLODipine 5 MG tablet Commonly known as: NORVASC Take 1 tablet (5 mg total) by mouth daily.   clonazePAM 0.5 MG disintegrating tablet Commonly known as: KLONOPIN Take 1 tablet (0.5 mg total) by mouth 2 (two) times daily. Replaces: clonazePAM 0.5 MG tablet   Creon 36000 UNITS Cpep capsule Generic drug: lipase/protease/amylase Take 36,000-72,000 Units by mouth as directed. Take 2 Capsules by mouth (72,000 units) with meals and Take 1 Capsule by mouth (36,000 units) with snacks.   dexamethasone 4 MG tablet Commonly known as: Decadron Take 1 tablet (4 mg total) by mouth 2 (two) times daily with a meal.   docusate sodium  100 MG capsule Commonly known as: COLACE Take 1 capsule (100 mg total) by mouth 2 (two) times daily as needed for mild constipation.   Ensure Max Protein Liqd Take 330 mLs (11 oz total) by mouth 2 (two) times daily.   fluconazole 200 MG tablet Commonly known as: DIFLUCAN Take 4 tablets (800 mg total) by mouth daily. What changed: Another medication with the same name was removed. Continue taking this medication, and follow the directions you see here.   folic acid 1 MG tablet Commonly known as: FOLVITE Take 1 tablet by mouth once daily   hydrOXYzine 10 MG tablet Commonly known as: ATARAX Take 1 tablet (10 mg) by mouth 3 times daily as needed for anxiety.   Lantus SoloStar 100 UNIT/ML Solostar Pen Generic drug: insulin glargine Inject 15 Units into the skin daily.   levETIRAcetam 1000 MG tablet Commonly known as: KEPPRA Take 1 tablet (1,000 mg total) by mouth 2 (two) times daily.    levofloxacin 500 MG tablet Commonly known as: LEVAQUIN Take 1 tablet (500 mg total) by mouth daily. What changed:  medication strength how much to take   lidocaine 5 % ointment Commonly known as: XYLOCAINE Apply topically as needed.   lidocaine-prilocaine cream Commonly known as: EMLA Apply to port prior to access   LORazepam 1 MG tablet Commonly known as: ATIVAN Take 1 tablet (1 mg total) by mouth every 6 (six) hours as needed (anxiety).   metFORMIN 1000 MG tablet Commonly known as: GLUCOPHAGE Take 1 tablet (1,000 mg total) by mouth 2 (two) times daily.   Mylanta Maximum Strength 400-400-40 MG/5ML suspension Generic drug: alum & mag hydroxide-simeth Take 15 mLs by mouth 2 (two) times daily.   ondansetron 4 MG tablet Commonly known as: ZOFRAN Take 1 tablet (4 mg total) by mouth every 6 (six) hours as needed for nausea. What changed: how much to take   oxyCODONE 5 MG immediate release tablet Commonly known as: Oxy IR/ROXICODONE Take 1 tablet (5 mg total) by mouth every 8 (eight) hours as needed for moderate pain or severe pain.   pantoprazole 40 MG tablet Commonly known as: PROTONIX Take 1 tablet (40 mg total) by mouth daily.   polyethylene glycol powder 17 GM/SCOOP powder Commonly known as: GLYCOLAX/MIRALAX Take 17 g by mouth daily as needed for mild constipation.   prochlorperazine 10 MG tablet Commonly known as: COMPAZINE Take 1 tablet (10 mg total) by mouth every 6 (six) hours as needed. What changed: reasons to take this   ramelteon 8 MG tablet Commonly known as: ROZEREM Take 1 tablet (8 mg total) by mouth at bedtime.   TechLite Pen Needles 32G X 4 MM Misc Generic drug: Insulin Pen Needle Use to inject insulin daily as directed.   triamcinolone ointment 0.5 % Commonly known as: KENALOG Apply topically to affected area twice daily as directed,   TYLENOL 500 MG tablet Generic drug: acetaminophen Take 1,000 mg by mouth every 6 (six) hours as needed  for mild pain or headache.   valACYclovir 500 MG tablet Commonly known as: Valtrex Take 1 tablet (500 mg) by mouth 2 times daily. What changed:  when to take this reasons to take this        Contact information for follow-up providers     [place] Follow up.   Why: [facility]   Home Physical Therapy and Occupational Therapy Contact information: [address] [place] [address] [phone]  Contact information for after-discharge care     Destination     [facility] .   Service: Skilled Nursing Contact information: [address] [place] [address] [phone]                    Allergies  Allergen Reactions   Adhesive [Tape] Dermatitis   Statins Itching and Other (See Comments)    Myalgias, also    You were cared for by a hospitalist during your hospital stay. If you have any questions about your discharge medications or the care you received while you were in the hospital after you are discharged, you can call the unit and asked to speak with the hospitalist on call if the hospitalist that took care of you is not available. Once you are discharged, your primary care physician will handle any further medical issues. Please note that no refills for any discharge medications will be authorized once you are discharged, as it is imperative that you return to your primary care physician (or establish a relationship with a primary care physician if you do not have one) for your aftercare needs so that they can reassess your need for medications and monitor your lab values.  You were cared for by a hospitalist during your hospital stay. If you have any questions about your discharge medications or the care you received while you were in the hospital after you are discharged, you can call the unit and asked to speak with the hospitalist on call if the  hospitalist that took care of you is not available. Once you are discharged, your primary care physician will handle any further medical issues. Please note that NO REFILLS for any discharge medications will be authorized once you are discharged, as it is imperative that you return to your primary care physician (or establish a relationship with a primary care physician if you do not have one) for your aftercare needs so that they can reassess your need for medications and monitor your lab values.  Please request your Prim.MD to go over all Hospital Tests and Procedure/Radiological results at the follow up, please get all Hospital records sent to your Prim MD by signing hospital release before you go home.  Get CBC, CMP, 2 view Chest X ray checked  by Primary MD during your next visit or SNF MD in 5-7 days ( we routinely change or add medications that can affect your baseline labs and fluid status, therefore we recommend that you get the mentioned basic workup next visit with your PCP, your PCP may decide not to get them or add new tests based on their clinical decision)  On your next visit with your primary care physician please Get Medicines reviewed and adjusted.  If you experience worsening of your admission symptoms, develop shortness of breath, life threatening emergency, suicidal or homicidal thoughts you must seek medical attention immediately by calling 911 or calling your MD immediately  if symptoms less severe.  You Must read complete instructions/literature along with all the possible adverse reactions/side effects for all the Medicines you take and that have been prescribed to you. Take any new Medicines after you have completely understood and accpet all the possible adverse reactions/side effects.   Do not drive, operate heavy machinery, perform activities at heights, swimming or participation in water activities or provide baby sitting services if your were admitted for syncope or  siezures until you have seen by Primary MD or a  Neurologist and advised to do so again.  Do not drive when taking Pain medications.   Procedures/Studies: CT ABDOMEN PELVIS W CONTRAST  Result Date: 10/22/2022 CLINICAL DATA:  Infected pseudocyst. EXAM: CT ABDOMEN AND PELVIS WITH CONTRAST TECHNIQUE: Multidetector CT imaging of the abdomen and pelvis was performed using the standard protocol following bolus administration of intravenous contrast. RADIATION DOSE REDUCTION: This exam was performed according to the departmental dose-optimization program which includes automated exposure control, adjustment of the mA and/or kV according to patient size and/or use of iterative reconstruction technique. CONTRAST:  OMNIPAQUE IOHEXOL 300 MG/ML  SOLN COMPARISON:  October 05, 2022. FINDINGS: Lower chest: Minimal right pleural effusion is noted with minimal adjacent subsegmental atelectasis. Stable 5.6 x 4.0 cm mass noted medially in left lower lobe. Hepatobiliary: Large gallstone is again noted. No or biliary dilatation is noted. Liver is unremarkable. Pancreas: 2.8 x 2.1 cm fluid collection is seen between pancreatic body and stomach which is slightly enlarged compared to prior exam and most consistent with pseudocyst. 14 x 13 mm low density is noted in pancreatic tail most consistent with pseudocyst. 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 Overall, this pseudocyst is significantly smaller, currently measuring 6.2 x 1.8 cm. Spleen: Normal in size without focal abnormality. Adrenals/Urinary Tract: Adrenal glands are unremarkable. Kidneys are normal, without renal calculi, focal lesion, or hydronephrosis. Bladder is unremarkable. Stomach/Bowel: As noted above, there is a drainage stent extending from pseudo cyst noted along greater curvature stomach into gastric lumen. There is no evidence of  bowel obstruction or inflammation. Sigmoid diverticulosis is noted without inflammation. The appendix is unremarkable. Vascular/Lymphatic: Aortic atherosclerosis. No enlarged abdominal or pelvic lymph nodes. Reproductive: Status post hysterectomy. No adnexal masses. Other: No abdominal wall hernia or abnormality. No abdominopelvic ascites. Musculoskeletal: No acute or significant osseous findings. IMPRESSION: Largest pancreatic cyst noted along the greater curvature of stomach is significantly smaller status post placement of internal drainage stent into gastric lumen. However, there are 2 other probable pseudocysts that appear to be enlarged compared to prior exam, 1 measuring 28 x 21 mm between the pancreatic body and stomach and another measuring 14 x 13 mm in the pancreatic tail. Large gallstone is noted. Minimal right pleural effusion is noted with minimal adjacent subsegmental atelectasis. Grossly stable left lower lobe mass is noted. Aortic Atherosclerosis (ICD10-I70.0). Electronically Signed   By: [name] M.D.   On: 10/22/2022 17:46   CT Head Wo Contrast  Result Date: 10/13/2022 CLINICAL DATA:  62 year old female with increasing seizures. Altered mental status. Lung cancer with brain metastases. EXAM: CT HEAD WITHOUT CONTRAST TECHNIQUE: Contiguous axial images were obtained from the base of the skull through the vertex without intravenous contrast. RADIATION DOSE REDUCTION: This exam was performed according to the departmental dose-optimization program which includes automated exposure control, adjustment of the mA and/or kV according to patient size and/or use of iterative reconstruction technique. COMPARISON:  Brain MRI 09/29/2022 and earlier. FINDINGS: Brain: Posterior left frontal lobe middle and superior frontal gyrus vasogenic edema related to known cortically based metastasis (series 2, image 27) has progressed since 09/29/2022 (compare coronal image 44 today to series 15, image 17 at that  time) and there is new mild mass effect on the left lateral ventricle. No acute intracranial hemorrhage identified. No ventriculomegaly. No midline shift. Normal basilar cisterns. No other areas of vasogenic edema by CT. No evidence of  superimposed acute cortically based infarct. Vascular: No suspicious intracranial vascular hyperdensity. Skull: No acute or suspicious osseous lesion identified. Sinuses/Orbits: Visualized paranasal sinuses and mastoids are stable and well aerated. Other: No acute orbit or scalp soft tissue finding. IMPRESSION: Progression of left frontal metastasis associated vasogenic edema since 09/29/2022. Mild new mass effect on the left lateral ventricle. But no midline shift or other complicating features. Electronically Signed   By: Odessa Fleming M.D.   On: 10/13/2022 06:34     The results of significant diagnostics from this hospitalization (including imaging, microbiology, ancillary and laboratory) are listed below for reference.     Microbiology: No results found for this or any previous visit (from the past 240 hour(s)).   Labs: BNP (last 3 results) No results for input(s): "BNP" in the last 8760 hours. Basic Metabolic Panel: Recent Labs  Lab 10/30/22 0346  NA 135  K 3.6  CL 100  CO2 26  GLUCOSE 182*  BUN 20  CREATININE 0.64  CALCIUM 8.4*   Liver Function Tests: Recent Labs  Lab 10/30/22 0346  AST 22  ALT 35  ALKPHOS 118  BILITOT 0.9  PROT 5.1*  ALBUMIN 2.4*   No results for input(s): "LIPASE", "AMYLASE" in the last 168 hours. No results for input(s): "AMMONIA" in the last 168 hours. CBC: Recent Labs  Lab 10/30/22 0346  WBC 15.1*  HGB 12.6  HCT 39.0  MCV 97.0  PLT 101*   Cardiac Enzymes: No results for input(s): "CKTOTAL", "CKMB", "CKMBINDEX", "TROPONINI" in the last 168 hours. BNP: Invalid input(s): "POCBNP" CBG: Recent Labs  Lab 11/03/22 1135 11/03/22 1618 11/03/22 2045 11/04/22 0749 11/04/22 1120  GLUCAP 232* 161* 198* 243* 238*    D-Dimer No results for input(s): "DDIMER" in the last 72 hours. Hgb A1c No results for input(s): "HGBA1C" in the last 72 hours. Lipid Profile No results for input(s): "CHOL", "HDL", "LDLCALC", "TRIG", "CHOLHDL", "LDLDIRECT" in the last 72 hours. Thyroid function studies No results for input(s): "TSH", "T4TOTAL", "T3FREE", "THYROIDAB" in the last 72 hours.  Invalid input(s): "FREET3" Anemia work up No results for input(s): "VITAMINB12", "FOLATE", "FERRITIN", "TIBC", "IRON", "RETICCTPCT" in the last 72 hours. Urinalysis    Component Value Date/Time   COLORURINE YELLOW 10/13/2022 0830   APPEARANCEUR HAZY (A) 10/13/2022 0830   LABSPEC 1.009 10/13/2022 0830   PHURINE 5.0 10/13/2022 0830   GLUCOSEU NEGATIVE 10/13/2022 0830   HGBUR NEGATIVE 10/13/2022 0830   BILIRUBINUR NEGATIVE 10/13/2022 0830   KETONESUR 20 (A) 10/13/2022 0830   PROTEINUR NEGATIVE 10/13/2022 0830   UROBILINOGEN 0.2 04/07/2011 2356   NITRITE NEGATIVE 10/13/2022 0830   LEUKOCYTESUR SMALL (A) 10/13/2022 0830   Sepsis Labs Recent Labs  Lab 10/30/22 0346  WBC 15.1*   Microbiology No results found for this or any previous visit (from the past 240 hour(s)).   Time coordinating discharge:  I have spent 35 minutes face to face with the patient and on the ward discussing the patients care, assessment, plan and disposition with other care givers. >50% of the time was devoted counseling the patient about the risks and benefits of treatment/Discharge disposition and coordinating care.   SIGNED:   Miguel Rota, MD  [facility] 11/04/2022, 2:32 PM   If 7PM-7AM, please contact night-coverage

## 2022-11-04 NOTE — Progress Notes (Signed)
PROGRESS NOTE    Carolyn Hooper  ZDG:[id number] DOB: Apr 12, 1960 DOA: 10/13/2022 PCP: [name], FNP    62 yrs old female with medical history significant of hidradenitis suppurativa complicated by abscess, anxiety, type 2 diabetes, hyperinsulinemia, hypertension, gestational preeclampsia, auditory neuroma, history of pancreatitis, vertigo, seizures, history of lung cancer,  metastatic to brain who is brought to the emergency department by a relative after having seizures for the past 2 days. She describes her being unable to talk and fast blinking of her eyelids.  Patient also continued to complaining about abdominal pain from pancreatitis. Pertinent  labs include lipase 297, CT head showing progression of left frontal metastasis associated with vasogenic edema since 2 weeks ago.  Mild new mass effect on the left lateral ventricle but no midline shift or other complicating features.  Patient is admitted for further evaluation and started on Decadron as per Dr. Liana Gerold recommendation.  Hospital course was complicated by positive Candida and Streptococcus viridans.  CT scan showed pancreatic infected pseudocyst versus abscess.  Patient was seen by infectious disease and GI.  No further intervention per GI at this time.  ID recommended fluconazole and Levaquin until November 29, 2022 until outpatient ID follow-up.  PT/OT has recommended SNF.     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  Discussed case with neuro-oncology.   Continue Decadron and Keppra     Abdominal pain secondary to acute pancreatitis: Pain control, antiemetics, PPI.  Continue pancreatic enzymes   Pancreatic pseudocyst, Infected: Positive for Candida and Streptococcus viridans.  CT scan showed evidence of 28X 21 mm fluid collection in the pancreatic body and in the stomach concerning for infected pseudocyst versus abscess.  This is not amenable to drainage per IR.  Holding off on any endoscopic evaluation per GI.  ID recommending fluconazole 800 mg daily and Levaquin 500 mg daily until ID visit on 9/13   Type 2 diabetes with hyperglycemia: Last HbA1c was 8.4% on 09/04/2022. Adjust Semglee and Premeal insulin as necessary  Essential hypertension, uncontrolled - Norvasc 5 mg orally daily.  Adjust as necessary.  IV as needed   GAD (generalized anxiety disorder) Continue Klonopin 0.5 mg every 12 hours as needed.   GERD (gastroesophageal reflux disease) Continue pantoprazole 40 mg daily. Antiacid, H2 blocker as needed.   Hypokalemia Replaced.  Resolved.   Suicidal thoughts: Patient has told staff that she has thoughts about hurting herself.   Psychiatry was consulted and found to be not a suicidal risk, psych signed off   Goals of care: Palliative Care is following Transferred to rehab with palliative care services to follow the patient.   Afib -Currently rate controlled.  Holding off on anticoagulation due to brain mets which is high risk for bleeding   PT/OT = SNF with palliative care services to follow             Diet Orders (From admission, onward)     Start     Ordered   10/25/22 1825  Diet regular Room service appropriate? Yes; Fluid consistency: Thin  Diet effective now       Question Answer Comment  Room service appropriate? Yes   Fluid consistency: Thin      10/25/22 1824            Subjective: No complaints.    Examination:  General exam: Appears calm and comfortable  Respiratory system: Clear  to auscultation. Respiratory effort normal. Cardiovascular system: S1 & S2 heard, RRR. No JVD, murmurs, rubs, gallops or clicks. No pedal edema. Gastrointestinal system: Abdomen is nondistended, soft and nontender. No organomegaly or masses felt. Normal bowel sounds heard. Central nervous system: Alert and oriented. No focal neurological deficits. Extremities: Symmetric 5 x 5 power. Skin: No rashes, lesions or ulcers Psychiatry: Judgement and insight appear poor  Objective: Vitals:   11/03/22 1222 11/03/22 2049 11/04/22 0520 11/04/22 0929  BP: (!) 150/70 (!) 161/78 135/64 (!) 156/62  Pulse: 74 73 65 78  Resp: 20 19 16 20   Temp: 97.7 F (36.5 C) 97.9 F (36.6 C) (!) 97.5 F (36.4 C) (!) 97.4 F (36.3 C)  TempSrc: Oral Oral Oral Oral  SpO2: 97% 94% 96% 98%  Weight:      Height:        Intake/Output Summary (Last 24 hours) at 11/04/2022 1143 Last data filed at 11/04/2022 1610 Gross per 24 hour  Intake 1442.09 ml  Output 1400 ml  Net 42.09 ml   Filed Weights   10/13/22 0429  Weight: 54.4 kg    Scheduled Meds:  amLODipine  5 mg Oral Daily   antiseptic oral rinse  15 mL Mouth Rinse BID   Chlorhexidine Gluconate Cloth  6 each Topical Daily   clonazepam  0.5 mg Oral BID   dexamethasone (DECADRON) injection  4 mg Intravenous Q6H   docusate sodium  100 mg Oral BID   feeding supplement  237 mL Oral BID BM   fluconazole  800 mg Oral Daily   folic acid  1 mg Oral Daily   insulin aspart  0-15 Units Subcutaneous TID WC   insulin aspart  5 Units Subcutaneous TID WC   insulin glargine-yfgn  20 Units Subcutaneous Daily   levETIRAcetam  1,000 mg Oral BID   levofloxacin  500 mg Oral Daily   lipase/protease/amylase  72,000 Units Oral TID WC   pantoprazole  40 mg Oral Daily   ramelteon  8 mg Oral QHS   senna-docusate  1 tablet Oral BID   sodium chloride flush  10-40 mL Intracatheter Q12H   Continuous Infusions:  lactated ringers 50 mL/hr at 11/04/22 0948    Nutritional status      Body mass index is 18.25 kg/m.  Data Reviewed:   CBC: Recent Labs  Lab 10/30/22 0346  WBC 15.1*  HGB 12.6  HCT 39.0  MCV 97.0  PLT 101*   Basic Metabolic Panel: Recent Labs  Lab 10/30/22 0346  NA 135  K 3.6  CL 100  CO2 26  GLUCOSE 182*  BUN 20  CREATININE 0.64  CALCIUM 8.4*   GFR: Estimated Creatinine Clearance: 62.6 mL/min (by C-G formula based on SCr of 0.64 mg/dL). Liver Function Tests: Recent Labs  Lab 10/30/22 0346  AST 22  ALT 35  ALKPHOS 118  BILITOT 0.9  PROT 5.1*  ALBUMIN 2.4*   No results for input(s): "LIPASE", "AMYLASE" in the last 168 hours. No results for input(s): "AMMONIA" in the last 168 hours. Coagulation Profile: No results for input(s): "INR", "PROTIME" in  the last 168 hours. Cardiac Enzymes: No results for input(s): "CKTOTAL", "CKMB", "CKMBINDEX", "TROPONINI" in the last 168 hours. BNP (last 3 results) No results for input(s): "PROBNP" in the last 8760 hours. HbA1C: No results for input(s): "HGBA1C" in the last 72 hours. CBG: Recent Labs  Lab 11/03/22 1135 11/03/22 1618 11/03/22 2045 11/04/22 0749 11/04/22 1120  GLUCAP 232* 161* 198* 243* 238*   Lipid Profile: No results for input(s): "CHOL", "HDL", "LDLCALC", "TRIG", "CHOLHDL", "LDLDIRECT" in the last 72 hours. Thyroid Function Tests: No results for input(s): "TSH", "T4TOTAL", "FREET4", "T3FREE", "THYROIDAB" in the last 72 hours. Anemia Panel: No results for input(s): "VITAMINB12", "FOLATE", "FERRITIN", "TIBC", "IRON", "RETICCTPCT" in the last 72 hours. Sepsis Labs: No results for input(s): "PROCALCITON", "LATICACIDVEN" in the last 168 hours.  No results found for this or any previous visit (from the past 240 hour(s)).       Radiology Studies: No results found.         LOS: 21 days   Time spent= 35 mins    Miguel Rota, MD [facility]  If 7PM-7AM, please contact night-coverage  11/04/2022, 11:43 AM

## 2022-11-04 NOTE — TOC Progression Note (Signed)
Transition of Care [facility]) - Progression Note    Patient Details  Name: Carolyn Hooper MRN: [id number] Date of Birth: [birth year]  [facility]) CM/SW Contact  [name], RN Phone Number: 11/04/2022, 2:52 PM  Clinical Narrative:  DC order to [facility], pt's mother [place] [facility]) notified. [facility] called for transportation. No further TOC needs identified.      Expected Discharge Plan: Skilled Nursing Facility Barriers to Discharge: Barriers Resolved  Expected Discharge Plan and Services     Post Acute Care Choice: Skilled Nursing Facility Living arrangements for the past 2 months: Single Family Home Expected Discharge Date: 11/04/22                                     Social Determinants of Health (SDOH) Interventions SDOH Screenings   Food Insecurity: No Food Insecurity (10/14/2022)  Housing: Low Risk  (10/14/2022)  Transportation Needs: No Transportation Needs (10/14/2022)  Utilities: Not At Risk (10/14/2022)  Depression (PHQ2-9): Medium Risk (07/12/2022)  Tobacco Use: Medium Risk (10/14/2022)    Readmission Risk Interventions    10/08/2022    4:55 PM 09/24/2022    3:55 PM 09/07/2022    2:44 PM  Readmission Risk Prevention Plan  Transportation Screening Complete Complete Complete  Medication Review [employer]) Complete Complete Complete  PCP or Specialist appointment within 3-5 days of discharge Complete Complete Complete  HRI or Home Care Consult Complete Complete Complete  SW Recovery Care/Counseling Consult Complete Complete Complete  Palliative Care Screening Not Applicable Not Applicable Not Applicable  Skilled Nursing Facility Not Applicable Not Applicable Not Applicable

## 2022-11-05 ENCOUNTER — Ambulatory Visit: Payer: BC Managed Care – PPO

## 2022-11-05 ENCOUNTER — Ambulatory Visit: Payer: BC Managed Care – PPO | Admitting: Internal Medicine

## 2022-11-05 ENCOUNTER — Other Ambulatory Visit: Payer: BC Managed Care – PPO

## 2022-11-05 NOTE — Progress Notes (Deleted)
Palliative Medicine [facility]  [phone] [phone]) [phone]   Name: Carolyn Hooper Date: 11/05/2022 MRN: [id number]  DOB: Apr 21, 1960  Patient Care Team: [name], FNP as PCP - General (Nurse Practitioner) [place], MD (Inactive) as Consulting Physician (Gastroenterology)    REASON FOR CONSULTATION: Carolyn Hooper is a 62 y.o. 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 Palliative ask to see for symptom management and goals of care.    SOCIAL HISTORY:     reports that she quit smoking about 8 months ago. Her smoking use included cigarettes. She has never used smokeless tobacco. She reports current alcohol use. She reports that she does not use drugs.  ADVANCE DIRECTIVES:  None on file  CODE STATUS: DNR  PAST MEDICAL HISTORY: Past Medical History:  Diagnosis Date   Abscess    hydroadenitis superativa   Anxiety    DM (diabetes mellitus) (HCC)    Hyperinsulinemia    Hypertension 1983   gestational - pre-eclampsia   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[facility])    Neuroma    audiotry neuroma   Pancreatitis    Seizure (HCC)    Vertigo     PAST SURGICAL HISTORY:  Past Surgical History:  Procedure Laterality Date   ABDOMINAL HYSTERECTOMY  2006   remaining left ovary   BALLOON DILATION N/A 09/26/2022   Procedure: BALLOON DILATION;  Surgeon: Lemar Lofty., MD;  Location: [facility];  Service: Gastroenterology;  Laterality: N/A;   BIOPSY  09/26/2022   Procedure: BIOPSY;  Surgeon: Meridee Score Netty Starring., MD;  Location: [facility];  Service: Gastroenterology;;   BRONCHIAL BIOPSY  02/19/2022   Procedure: BRONCHIAL BIOPSIES;  Surgeon: Josephine Igo, DO;  Location: [facility];   Service: Pulmonary;;   BRONCHIAL BRUSHINGS  02/19/2022   Procedure: BRONCHIAL BRUSHINGS;  Surgeon: Josephine Igo, DO;  Location: [facility];  Service: Pulmonary;;   BRONCHIAL NEEDLE ASPIRATION BIOPSY  02/19/2022   Procedure: BRONCHIAL NEEDLE ASPIRATION BIOPSIES;  Surgeon: Josephine Igo, DO;  Location: [facility];  Service: Pulmonary;;   ESOPHAGOGASTRODUODENOSCOPY (EGD) WITH PROPOFOL N/A 09/26/2022   Procedure: ESOPHAGOGASTRODUODENOSCOPY (EGD) WITH PROPOFOL;  Surgeon: Lemar Lofty., MD;  Location: [facility];  Service: Gastroenterology;  Laterality: N/A;   IR IMAGING GUIDED PORT INSERTION  03/29/2022   PANCREATIC STENT PLACEMENT  09/26/2022   Procedure: PANCREATIC STENT PLACEMENT;  Surgeon: Meridee Score Netty Starring., MD;  Location: [facility];  Service: Gastroenterology;;  axios   SHOULDER SURGERY Right 1960s   UPPER ESOPHAGEAL ENDOSCOPIC ULTRASOUND (EUS) N/A 09/26/2022   Procedure: UPPER ESOPHAGEAL ENDOSCOPIC ULTRASOUND (EUS);  Surgeon: Lemar Lofty., MD;  Location: [facility];  Service: Gastroenterology;  Laterality: N/A;  For cyst drainage probably will need stent    HEMATOLOGY/ONCOLOGY HISTORY:  Oncology History  Non-small cell lung cancer (HCC)  02/25/2022 Initial Diagnosis   Non-small cell lung cancer (HCC)   03/06/2022 -  Chemotherapy   Patient is on Treatment Plan : LUNG Carboplatin (5) + Pemetrexed (500) + Pembrolizumab (200) D1 q21d Induction x 4 cycles / Maintenance Pemetrexed (500) + Pembrolizumab (200) D1 q21d       ALLERGIES:  is allergic to adhesive [tape] and statins.  MEDICATIONS:  Current Outpatient Medications  Medication Sig Dispense Refill   albuterol (VENTOLIN HFA) 108 (90 Base) MCG/ACT inhaler  Inhale 2 puffs into the lungs every 6 (six) hours as needed. (Patient not taking: Reported on 10/05/2022) 18 g 2   amLODipine (NORVASC) 5 MG tablet Take 1 tablet (5 mg total) by mouth daily.     clonazePAM (KLONOPIN) 0.5 MG disintegrating tablet  Take 1 tablet (0.5 mg total) by mouth 2 (two) times daily. 30 tablet 0   dexamethasone (DECADRON) 4 MG tablet Take 1 tablet (4 mg total) by mouth 2 (two) times daily with a meal.     docusate sodium (COLACE) 100 MG capsule Take 1 capsule (100 mg total) by mouth 2 (two) times daily as needed for mild constipation.     Ensure Max Protein (ENSURE MAX PROTEIN) LIQD Take 330 mLs (11 oz total) by mouth 2 (two) times daily. 330 mL 15   fluconazole (DIFLUCAN) 200 MG tablet Take 4 tablets (800 mg total) by mouth daily.     folic acid (FOLVITE) 1 MG tablet Take 1 tablet by mouth once daily 30 tablet 0   hydrOXYzine (ATARAX) 10 MG tablet Take 1 tablet (10 mg) by mouth 3 times daily as needed for anxiety. (Patient not taking: Reported on 10/13/2022) 30 tablet 0   insulin glargine (LANTUS SOLOSTAR) 100 UNIT/ML Solostar Pen Inject 15 Units into the skin daily. (Patient not taking: Reported on 10/13/2022)     Insulin Pen Needle (PEN NEEDLES) 32G X 4 MM MISC Use to inject insulin daily as directed. 100 each 11   levETIRAcetam (KEPPRA) 1000 MG tablet Take 1 tablet (1,000 mg total) by mouth 2 (two) times daily. 60 tablet 2   levofloxacin (LEVAQUIN) 500 MG tablet Take 1 tablet (500 mg total) by mouth daily.     lidocaine (XYLOCAINE) 5 % ointment Apply topically as needed. (Patient not taking: Reported on 10/13/2022) 60 g 1   lidocaine-prilocaine (EMLA) cream Apply to port prior to access (Patient not taking: Reported on 10/13/2022) 30 g 0   lipase/protease/amylase (CREON) 36000 UNITS CPEP capsule Take 36,000-72,000 Units by mouth as directed. Take 2 Capsules by mouth (72,000 units) with meals and Take 1 Capsule by mouth (36,000 units) with snacks.     LORazepam (ATIVAN) 1 MG tablet Take 1 tablet (1 mg total) by mouth every 6 (six) hours as needed (anxiety). 30 tablet 0   metFORMIN (GLUCOPHAGE) 1000 MG tablet Take 1 tablet (1,000 mg total) by mouth 2 (two) times daily. (Patient not taking: Reported on 10/13/2022) 180 tablet 1    MYLANTA MAXIMUM STRENGTH 400-400-40 MG/5ML suspension Take 15 mLs by mouth 2 (two) times daily. (Patient not taking: Reported on 10/13/2022)     ondansetron (ZOFRAN) 4 MG tablet Take 1 tablet (4 mg total) by mouth every 6 (six) hours as needed for nausea. (Patient taking differently: Take 4-8 mg by mouth every 6 (six) hours as needed for nausea.) 30 tablet 2   oxyCODONE (OXY IR/ROXICODONE) 5 MG immediate release tablet Take 1 tablet (5 mg total) by mouth every 8 (eight) hours as needed for moderate pain or severe pain. 30 tablet 0   pantoprazole (PROTONIX) 40 MG tablet Take 1 tablet (40 mg total) by mouth daily. 30 tablet 1   polyethylene glycol powder (GLYCOLAX/MIRALAX) 17 GM/SCOOP powder Take 17 g by mouth daily as needed for mild constipation. (Patient not taking: Reported on 10/13/2022) 238 g 0   prochlorperazine (COMPAZINE) 10 MG tablet Take 1 tablet (10 mg total) by mouth every 6 (six) hours as needed. (Patient taking differently: Take 10 mg by mouth every 6 (  six) hours as needed for nausea or vomiting.) 30 tablet 2   ramelteon (ROZEREM) 8 MG tablet Take 1 tablet (8 mg total) by mouth at bedtime. 15 tablet 0   triamcinolone ointment (KENALOG) 0.5 % Apply topically to affected area twice daily as directed, (Patient not taking: Reported on 10/13/2022) 30 g 0   TYLENOL 500 MG tablet Take 1,000 mg by mouth every 6 (six) hours as needed for mild pain or headache.     valACYclovir (VALTREX) 500 MG tablet Take 1 tablet (500 mg) by mouth 2 times daily. (Patient taking differently: Take 500 mg by mouth daily as needed (for cold sores).) 60 tablet 2   No current facility-administered medications for this visit.    VITAL SIGNS: There were no vitals taken for this visit. There were no vitals filed for this visit.  Estimated body mass index is 18.25 kg/m as calculated from the following:   Height as of 10/13/22: 5\' 8"  (1.727 m).   Weight as of 10/13/22: 120 lb (54.4 kg).  LABS: CBC:    Component  Value Date/Time   WBC 15.1 (H) 10/30/2022 0346   HGB 12.6 10/30/2022 0346   HGB 11.4 (L) 09/16/2022 1415   HGB 11.2 07/02/2022 0832   HCT 39.0 10/30/2022 0346   HCT 34.0 07/02/2022 0832   PLT 101 (L) 10/30/2022 0346   PLT 119 (L) 09/16/2022 1415   PLT 149 (L) 07/02/2022 0832   MCV 97.0 10/30/2022 0346   MCV 102 (H) 07/02/2022 0832   NEUTROABS 2.8 10/13/2022 0425   NEUTROABS 3.3 07/02/2022 0832   LYMPHSABS 0.9 10/13/2022 0425   LYMPHSABS 2.1 07/02/2022 0832   MONOABS 0.6 10/13/2022 0425   EOSABS 0.1 10/13/2022 0425   EOSABS 0.3 07/02/2022 0832   BASOSABS 0.1 10/13/2022 0425   BASOSABS 0.1 07/02/2022 0832   Comprehensive Metabolic Panel:    Component Value Date/Time   NA 135 10/30/2022 0346   NA 141 07/12/2022 1114   K 3.6 10/30/2022 0346   CL 100 10/30/2022 0346   CO2 26 10/30/2022 0346   BUN 20 10/30/2022 0346   BUN 11 07/12/2022 1114   CREATININE 0.64 10/30/2022 0346   CREATININE 0.81 09/16/2022 1415   GLUCOSE 182 (H) 10/30/2022 0346   CALCIUM 8.4 (L) 10/30/2022 0346   AST 22 10/30/2022 0346   AST 18 09/16/2022 1415   ALT 35 10/30/2022 0346   ALT 68 (H) 09/16/2022 1415   ALKPHOS 118 10/30/2022 0346   BILITOT 0.9 10/30/2022 0346   BILITOT 0.6 09/16/2022 1415   PROT 5.1 (L) 10/30/2022 0346   PROT 5.9 (L) 07/12/2022 1114   ALBUMIN 2.4 (L) 10/30/2022 0346   ALBUMIN 3.8 (L) 07/12/2022 1114    RADIOGRAPHIC STUDIES: CT ABDOMEN PELVIS W CONTRAST  Result Date: 10/22/2022 CLINICAL DATA:  Infected pseudocyst. EXAM: CT ABDOMEN AND PELVIS WITH CONTRAST TECHNIQUE: Multidetector CT imaging of the abdomen and pelvis was performed using the standard protocol following bolus administration of intravenous contrast. RADIATION DOSE REDUCTION: This exam was performed according to the departmental dose-optimization program which includes automated exposure control, adjustment of the mA and/or kV according to patient size and/or use of iterative reconstruction technique. CONTRAST:   OMNIPAQUE IOHEXOL 300 MG/ML  SOLN COMPARISON:  October 05, 2022. FINDINGS: Lower chest: Minimal right pleural effusion is noted with minimal adjacent subsegmental atelectasis. Stable 5.6 x 4.0 cm mass noted medially in left lower lobe. Hepatobiliary: Large gallstone is again noted. No or biliary dilatation is noted. Liver is unremarkable.  Pancreas: 2.8 x 2.1 cm fluid collection is seen between pancreatic body and stomach which is slightly enlarged compared to prior exam and most consistent with pseudocyst. 14 x 13 mm low density is noted in pancreatic tail most consistent with pseudocyst. 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 Overall, this pseudocyst is significantly smaller, currently measuring 6.2 x 1.8 cm. Spleen: Normal in size without focal abnormality. Adrenals/Urinary Tract: Adrenal glands are unremarkable. Kidneys are normal, without renal calculi, focal lesion, or hydronephrosis. Bladder is unremarkable. Stomach/Bowel: As noted above, there is a drainage stent extending from pseudo cyst noted along greater curvature stomach into gastric lumen. There is no evidence of bowel obstruction or inflammation. Sigmoid diverticulosis is noted without inflammation. The appendix is unremarkable. Vascular/Lymphatic: Aortic atherosclerosis. No enlarged abdominal or pelvic lymph nodes. Reproductive: Status post hysterectomy. No adnexal masses. Other: No abdominal wall hernia or abnormality. No abdominopelvic ascites. Musculoskeletal: No acute or significant osseous findings. IMPRESSION: Largest pancreatic cyst noted along the greater curvature of stomach is significantly smaller status post placement of internal drainage stent into gastric lumen. However, there are 2 other probable pseudocysts that appear to be enlarged compared to prior exam, 1 measuring 28 x 21 mm between the pancreatic body and  stomach and another measuring 14 x 13 mm in the pancreatic tail. Large gallstone is noted. Minimal right pleural effusion is noted with minimal adjacent subsegmental atelectasis. Grossly stable left lower lobe mass is noted. Aortic Atherosclerosis (ICD10-I70.0). Electronically Signed   By: [name] M.D.   On: 10/22/2022 17:46   CT Head Wo Contrast  Result Date: 10/13/2022 CLINICAL DATA:  62 year old female with increasing seizures. Altered mental status. Lung cancer with brain metastases. EXAM: CT HEAD WITHOUT CONTRAST TECHNIQUE: Contiguous axial images were obtained from the base of the skull through the vertex without intravenous contrast. RADIATION DOSE REDUCTION: This exam was performed according to the departmental dose-optimization program which includes automated exposure control, adjustment of the mA and/or kV according to patient size and/or use of iterative reconstruction technique. COMPARISON:  Brain MRI 09/29/2022 and earlier. FINDINGS: Brain: Posterior left frontal lobe middle and superior frontal gyrus vasogenic edema related to known cortically based metastasis (series 2, image 27) has progressed since 09/29/2022 (compare coronal image 44 today to series 15, image 17 at that time) and there is new mild mass effect on the left lateral ventricle. No acute intracranial hemorrhage identified. No ventriculomegaly. No midline shift. Normal basilar cisterns. No other areas of vasogenic edema by CT. No evidence of superimposed acute cortically based infarct. Vascular: No suspicious intracranial vascular hyperdensity. Skull: No acute or suspicious osseous lesion identified. Sinuses/Orbits: Visualized paranasal sinuses and mastoids are stable and well aerated. Other: No acute orbit or scalp soft tissue finding. IMPRESSION: Progression of left frontal metastasis associated vasogenic edema since 09/29/2022. Mild new mass effect on the left lateral ventricle. But no midline shift or other complicating  features. Electronically Signed   By: Odessa Fleming M.D.   On: 10/13/2022 06:34    PERFORMANCE STATUS (ECOG) : {CHL ONC ECOG ON:[id number]}  Review of Systems Unless otherwise noted, a complete review of systems is negative.  Physical Exam General: NAD Cardiovascular: regular rate and rhythm Pulmonary: clear ant fields Abdomen: soft, nontender, + bowel sounds Extremities: no edema, no joint deformities Skin: no rashes Neurological:  IMPRESSION: *** I introduced myself, Laden Fieldhouse RN, and Palliative's role in collaboration  with the oncology team. Concept of Palliative Care was introduced as specialized medical care for people and their families living with serious illness.  It focuses on providing relief from the symptoms and stress of a serious illness.  The goal is to improve quality of life for both the patient and the family. Values and goals of care important to patient and family were attempted to be elicited.    We discussed *** current illness and what it means in the larger context of *** on-going co-morbidities. Natural disease trajectory and expectations were discussed.  I discussed the importance of continued conversation with family and their medical providers regarding overall plan of care and treatment options, ensuring decisions are within the context of the patients values and GOCs.  PLAN: Established therapeutic relationship. Education provided on palliative's role in collaboration with their Oncology/Radiation team. I will plan to see patient back in 2-4 weeks in collaboration to other oncology appointments.    Patient expressed understanding and was in agreement with this plan. She also understands that She can call the clinic at any time with any questions, concerns, or complaints.   Thank you for your referral and allowing Palliative to assist in Mrs. Julianne Buckhannon Stankovich's care.   Number and complexity of problems addressed: ***HIGH - 1 or more chronic illnesses with SEVERE  exacerbation, progression, or side effects of treatment - advanced cancer, pain. Any controlled substances utilized were prescribed in the context of palliative care.   Visit consisted of counseling and education dealing with the complex and emotionally intense issues of symptom management and palliative care in the setting of serious and potentially life-threatening illness.Greater than 50%  of this time was spent counseling and coordinating care related to the above assessment and plan.  Signed by: Willette Alma, AGPCNP-BC Palliative Medicine Team/[facility]   *Please note that this is a verbal dictation therefore any spelling or grammatical errors are due to the "[employer]" system interpretation.

## 2022-11-06 ENCOUNTER — Inpatient Hospital Stay: Payer: BC Managed Care – PPO

## 2022-11-06 ENCOUNTER — Inpatient Hospital Stay: Payer: BC Managed Care – PPO | Admitting: Internal Medicine

## 2022-11-06 ENCOUNTER — Encounter: Payer: Self-pay | Admitting: Medical Oncology

## 2022-11-06 ENCOUNTER — Encounter: Payer: BC Managed Care – PPO | Admitting: Dietician

## 2022-11-06 ENCOUNTER — Telehealth: Payer: Self-pay | Admitting: Medical Oncology

## 2022-11-06 ENCOUNTER — Inpatient Hospital Stay: Payer: BC Managed Care – PPO | Admitting: Nurse Practitioner

## 2022-11-06 DIAGNOSIS — K859 Acute pancreatitis without necrosis or infection, unspecified: Secondary | ICD-10-CM | POA: Diagnosis not present

## 2022-11-06 DIAGNOSIS — G40909 Epilepsy, unspecified, not intractable, without status epilepticus: Secondary | ICD-10-CM | POA: Diagnosis not present

## 2022-11-06 DIAGNOSIS — R45851 Suicidal ideations: Secondary | ICD-10-CM | POA: Diagnosis not present

## 2022-11-06 DIAGNOSIS — R5381 Other malaise: Secondary | ICD-10-CM | POA: Diagnosis not present

## 2022-11-06 NOTE — Progress Notes (Signed)
Carolyn Hooper's appointments  faxed to Gwyndolyn Kaufman @ [facility].

## 2022-11-07 ENCOUNTER — Telehealth: Payer: Self-pay | Admitting: Medical Oncology

## 2022-11-07 ENCOUNTER — Other Ambulatory Visit: Payer: BC Managed Care – PPO

## 2022-11-07 ENCOUNTER — Encounter: Payer: Self-pay | Admitting: Internal Medicine

## 2022-11-07 DIAGNOSIS — C3490 Malignant neoplasm of unspecified part of unspecified bronchus or lung: Secondary | ICD-10-CM | POA: Diagnosis not present

## 2022-11-07 DIAGNOSIS — R569 Unspecified convulsions: Secondary | ICD-10-CM | POA: Diagnosis not present

## 2022-11-07 DIAGNOSIS — C7931 Secondary malignant neoplasm of brain: Secondary | ICD-10-CM | POA: Diagnosis not present

## 2022-11-07 DIAGNOSIS — E1159 Type 2 diabetes mellitus with other circulatory complications: Secondary | ICD-10-CM | POA: Diagnosis not present

## 2022-11-07 NOTE — Telephone Encounter (Signed)
err

## 2022-11-07 NOTE — Telephone Encounter (Signed)
Returned pt call. LVM to call me back

## 2022-11-10 DIAGNOSIS — R1084 Generalized abdominal pain: Secondary | ICD-10-CM | POA: Diagnosis not present

## 2022-11-10 DIAGNOSIS — K8020 Calculus of gallbladder without cholecystitis without obstruction: Secondary | ICD-10-CM | POA: Diagnosis not present

## 2022-11-10 DIAGNOSIS — I639 Cerebral infarction, unspecified: Secondary | ICD-10-CM | POA: Diagnosis not present

## 2022-11-10 DIAGNOSIS — J189 Pneumonia, unspecified organism: Secondary | ICD-10-CM | POA: Diagnosis not present

## 2022-11-10 DIAGNOSIS — R4182 Altered mental status, unspecified: Secondary | ICD-10-CM | POA: Diagnosis not present

## 2022-11-10 DIAGNOSIS — K858 Other acute pancreatitis without necrosis or infection: Secondary | ICD-10-CM | POA: Diagnosis not present

## 2022-11-10 DIAGNOSIS — K859 Acute pancreatitis without necrosis or infection, unspecified: Secondary | ICD-10-CM | POA: Diagnosis not present

## 2022-11-10 DIAGNOSIS — Z515 Encounter for palliative care: Secondary | ICD-10-CM | POA: Diagnosis not present

## 2022-11-10 DIAGNOSIS — R531 Weakness: Secondary | ICD-10-CM | POA: Diagnosis not present

## 2022-11-10 DIAGNOSIS — R0600 Dyspnea, unspecified: Secondary | ICD-10-CM | POA: Diagnosis not present

## 2022-11-10 DIAGNOSIS — Z7189 Other specified counseling: Secondary | ICD-10-CM | POA: Diagnosis not present

## 2022-11-10 DIAGNOSIS — Z66 Do not resuscitate: Secondary | ICD-10-CM | POA: Diagnosis not present

## 2022-11-10 DIAGNOSIS — R1013 Epigastric pain: Secondary | ICD-10-CM | POA: Diagnosis not present

## 2022-11-10 DIAGNOSIS — I2693 Single subsegmental pulmonary embolism without acute cor pulmonale: Secondary | ICD-10-CM | POA: Diagnosis not present

## 2022-11-10 DIAGNOSIS — R918 Other nonspecific abnormal finding of lung field: Secondary | ICD-10-CM | POA: Diagnosis not present

## 2022-11-10 DIAGNOSIS — K8689 Other specified diseases of pancreas: Secondary | ICD-10-CM | POA: Diagnosis not present

## 2022-11-10 DIAGNOSIS — D6859 Other primary thrombophilia: Secondary | ICD-10-CM | POA: Diagnosis not present

## 2022-11-10 DIAGNOSIS — G9340 Encephalopathy, unspecified: Secondary | ICD-10-CM | POA: Diagnosis not present

## 2022-11-10 DIAGNOSIS — C7931 Secondary malignant neoplasm of brain: Secondary | ICD-10-CM | POA: Diagnosis not present

## 2022-11-10 DIAGNOSIS — E119 Type 2 diabetes mellitus without complications: Secondary | ICD-10-CM | POA: Diagnosis not present

## 2022-11-10 DIAGNOSIS — R55 Syncope and collapse: Secondary | ICD-10-CM | POA: Diagnosis not present

## 2022-11-10 DIAGNOSIS — K668 Other specified disorders of peritoneum: Secondary | ICD-10-CM | POA: Diagnosis not present

## 2022-11-10 DIAGNOSIS — F411 Generalized anxiety disorder: Secondary | ICD-10-CM | POA: Diagnosis not present

## 2022-11-10 DIAGNOSIS — G893 Neoplasm related pain (acute) (chronic): Secondary | ICD-10-CM | POA: Diagnosis not present

## 2022-11-10 DIAGNOSIS — J984 Other disorders of lung: Secondary | ICD-10-CM | POA: Diagnosis not present

## 2022-11-10 DIAGNOSIS — J90 Pleural effusion, not elsewhere classified: Secondary | ICD-10-CM | POA: Diagnosis not present

## 2022-11-10 DIAGNOSIS — R109 Unspecified abdominal pain: Secondary | ICD-10-CM | POA: Diagnosis not present

## 2022-11-10 DIAGNOSIS — I2699 Other pulmonary embolism without acute cor pulmonale: Secondary | ICD-10-CM | POA: Diagnosis not present

## 2022-11-10 DIAGNOSIS — E876 Hypokalemia: Secondary | ICD-10-CM | POA: Diagnosis not present

## 2022-11-10 DIAGNOSIS — Z452 Encounter for adjustment and management of vascular access device: Secondary | ICD-10-CM | POA: Diagnosis not present

## 2022-11-10 DIAGNOSIS — C3490 Malignant neoplasm of unspecified part of unspecified bronchus or lung: Secondary | ICD-10-CM | POA: Diagnosis not present

## 2022-11-11 ENCOUNTER — Telehealth: Payer: Self-pay | Admitting: *Deleted

## 2022-11-11 ENCOUNTER — Inpatient Hospital Stay: Payer: BC Managed Care – PPO

## 2022-11-11 NOTE — Telephone Encounter (Signed)
Received after hours report form 11/09/2022.  Patients son called to report that his mother is having slurred speech.  Sh eis having memory loss as well, with nightime hallucinations.  She has only had one dose of radiation and no chemotherapy.  Caller stated that pseech is moderately slurred in comparison.  Diagnosis stage 4 lung cancer metastasized to brain tumors, located in the center for language and fine mother skills.  She is receiving dexamethasone as a steroid to relieve swelling in the brain.  Nurse Ella Bodo RN advised to go to call 911 immediately.    Called understands recommendation but states his mother is at her baseline and juste wanted to schedule a follow up.  Caller declined EMS 911.      11/11/2022 Attempted to reach patient or son today to follow up.  Left message pending call back.

## 2022-11-13 ENCOUNTER — Other Ambulatory Visit: Payer: BC Managed Care – PPO

## 2022-11-13 ENCOUNTER — Ambulatory Visit: Payer: BC Managed Care – PPO | Admitting: Physician Assistant

## 2022-11-13 ENCOUNTER — Ambulatory Visit: Payer: BC Managed Care – PPO

## 2022-11-14 ENCOUNTER — Ambulatory Visit: Payer: BC Managed Care – PPO | Admitting: Internal Medicine

## 2022-11-15 ENCOUNTER — Other Ambulatory Visit: Payer: Self-pay

## 2022-11-19 ENCOUNTER — Inpatient Hospital Stay: Payer: BC Managed Care – PPO | Admitting: Internal Medicine

## 2022-11-20 NOTE — Progress Notes (Deleted)
Palliative Medicine [facility]  [phone] [phone]   Name: Carolyn Hooper Date: 11/20/2022 MRN: [id number]  DOB: 12/27/1960  Patient Care Team: [name], FNP as PCP - General (Nurse Practitioner) [place], MD (Inactive) as Consulting Physician (Gastroenterology)    REASON FOR CONSULTATION: Carolyn Hooper is a 62 y.o. 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 Palliative ask to see for symptom management and goals of care.    SOCIAL HISTORY:     reports that she quit smoking about 9 months ago. Her smoking use included cigarettes. She has never used smokeless tobacco. She reports current alcohol use. She reports that she does not use drugs.  ADVANCE DIRECTIVES:  None on file  CODE STATUS: DNR  PAST MEDICAL HISTORY: Past Medical History:  Diagnosis Date   Abscess    hydroadenitis superativa   Anxiety    DM (diabetes mellitus) (HCC)    Hyperinsulinemia    Hypertension 1983   gestational - pre-eclampsia   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[facility])    Neuroma    audiotry neuroma   Pancreatitis    Seizure (HCC)    Vertigo     PAST SURGICAL HISTORY:  Past Surgical History:  Procedure Laterality Date   ABDOMINAL HYSTERECTOMY  2006   remaining left ovary   BALLOON DILATION N/A 09/26/2022   Procedure: BALLOON DILATION;  Surgeon: Lemar Lofty., MD;  Location: [facility];  Service: Gastroenterology;  Laterality: N/A;   BIOPSY  09/26/2022   Procedure: BIOPSY;  Surgeon: Meridee Score Netty Starring., MD;  Location: [facility];  Service: Gastroenterology;;   BRONCHIAL BIOPSY  02/19/2022   Procedure: BRONCHIAL BIOPSIES;  Surgeon: Josephine Igo, DO;  Location: [facility];   Service: Pulmonary;;   BRONCHIAL BRUSHINGS  02/19/2022   Procedure: BRONCHIAL BRUSHINGS;  Surgeon: Josephine Igo, DO;  Location: [facility];  Service: Pulmonary;;   BRONCHIAL NEEDLE ASPIRATION BIOPSY  02/19/2022   Procedure: BRONCHIAL NEEDLE ASPIRATION BIOPSIES;  Surgeon: Josephine Igo, DO;  Location: [facility];  Service: Pulmonary;;   ESOPHAGOGASTRODUODENOSCOPY (EGD) WITH PROPOFOL N/A 09/26/2022   Procedure: ESOPHAGOGASTRODUODENOSCOPY (EGD) WITH PROPOFOL;  Surgeon: Lemar Lofty., MD;  Location: [facility];  Service: Gastroenterology;  Laterality: N/A;   IR IMAGING GUIDED PORT INSERTION  03/29/2022   PANCREATIC STENT PLACEMENT  09/26/2022   Procedure: PANCREATIC STENT PLACEMENT;  Surgeon: Meridee Score Netty Starring., MD;  Location: [facility];  Service: Gastroenterology;;  axios   SHOULDER SURGERY Right 1960s   UPPER ESOPHAGEAL ENDOSCOPIC ULTRASOUND (EUS) N/A 09/26/2022   Procedure: UPPER ESOPHAGEAL ENDOSCOPIC ULTRASOUND (EUS);  Surgeon: Lemar Lofty., MD;  Location: [facility];  Service: Gastroenterology;  Laterality: N/A;  For cyst drainage probably will need stent    HEMATOLOGY/ONCOLOGY HISTORY:  Oncology History  Non-small cell lung cancer (HCC)  02/25/2022 Initial Diagnosis   Non-small cell lung cancer (HCC)   03/06/2022 -  Chemotherapy   Patient is on Treatment Plan : LUNG Carboplatin (5) + Pemetrexed (500) + Pembrolizumab (200) D1 q21d Induction x 4 cycles / Maintenance Pemetrexed (500) + Pembrolizumab (200) D1 q21d       ALLERGIES:  is allergic to adhesive [tape] and statins.  MEDICATIONS:  Current Outpatient Medications  Medication Sig Dispense Refill   albuterol (VENTOLIN HFA) 108 (90 Base) MCG/ACT inhaler  Inhale 2 puffs into the lungs every 6 (six) hours as needed. (Patient not taking: Reported on 10/05/2022) 18 g 2   amLODipine (NORVASC) 5 MG tablet Take 1 tablet (5 mg total) by mouth daily.     clonazePAM (KLONOPIN) 0.5 MG disintegrating tablet  Take 1 tablet (0.5 mg total) by mouth 2 (two) times daily. 30 tablet 0   dexamethasone (DECADRON) 4 MG tablet Take 1 tablet (4 mg total) by mouth 2 (two) times daily with a meal.     docusate sodium (COLACE) 100 MG capsule Take 1 capsule (100 mg total) by mouth 2 (two) times daily as needed for mild constipation.     Ensure Max Protein (ENSURE MAX PROTEIN) LIQD Take 330 mLs (11 oz total) by mouth 2 (two) times daily. 330 mL 15   fluconazole (DIFLUCAN) 200 MG tablet Take 4 tablets (800 mg total) by mouth daily.     folic acid (FOLVITE) 1 MG tablet Take 1 tablet by mouth once daily 30 tablet 0   hydrOXYzine (ATARAX) 10 MG tablet Take 1 tablet (10 mg) by mouth 3 times daily as needed for anxiety. (Patient not taking: Reported on 10/13/2022) 30 tablet 0   insulin glargine (LANTUS SOLOSTAR) 100 UNIT/ML Solostar Pen Inject 15 Units into the skin daily. (Patient not taking: Reported on 10/13/2022)     Insulin Pen Needle (PEN NEEDLES) 32G X 4 MM MISC Use to inject insulin daily as directed. 100 each 11   levETIRAcetam (KEPPRA) 1000 MG tablet Take 1 tablet (1,000 mg total) by mouth 2 (two) times daily. 60 tablet 2   levofloxacin (LEVAQUIN) 500 MG tablet Take 1 tablet (500 mg total) by mouth daily.     lidocaine (XYLOCAINE) 5 % ointment Apply topically as needed. (Patient not taking: Reported on 10/13/2022) 60 g 1   lidocaine-prilocaine (EMLA) cream Apply to port prior to access (Patient not taking: Reported on 10/13/2022) 30 g 0   lipase/protease/amylase (CREON) 36000 UNITS CPEP capsule Take 36,000-72,000 Units by mouth as directed. Take 2 Capsules by mouth (72,000 units) with meals and Take 1 Capsule by mouth (36,000 units) with snacks.     LORazepam (ATIVAN) 1 MG tablet Take 1 tablet (1 mg total) by mouth every 6 (six) hours as needed (anxiety). 30 tablet 0   metFORMIN (GLUCOPHAGE) 1000 MG tablet Take 1 tablet (1,000 mg total) by mouth 2 (two) times daily. (Patient not taking: Reported on 10/13/2022) 180 tablet 1    MYLANTA MAXIMUM STRENGTH 400-400-40 MG/5ML suspension Take 15 mLs by mouth 2 (two) times daily. (Patient not taking: Reported on 10/13/2022)     ondansetron (ZOFRAN) 4 MG tablet Take 1 tablet (4 mg total) by mouth every 6 (six) hours as needed for nausea. (Patient taking differently: Take 4-8 mg by mouth every 6 (six) hours as needed for nausea.) 30 tablet 2   oxyCODONE (OXY IR/ROXICODONE) 5 MG immediate release tablet Take 1 tablet (5 mg total) by mouth every 8 (eight) hours as needed for moderate pain or severe pain. 30 tablet 0   pantoprazole (PROTONIX) 40 MG tablet Take 1 tablet (40 mg total) by mouth daily. 30 tablet 1   polyethylene glycol powder (GLYCOLAX/MIRALAX) 17 GM/SCOOP powder Take 17 g by mouth daily as needed for mild constipation. (Patient not taking: Reported on 10/13/2022) 238 g 0   prochlorperazine (COMPAZINE) 10 MG tablet Take 1 tablet (10 mg total) by mouth every 6 (six) hours as needed. (Patient taking differently: Take 10 mg by mouth every 6 (  six) hours as needed for nausea or vomiting.) 30 tablet 2   ramelteon (ROZEREM) 8 MG tablet Take 1 tablet (8 mg total) by mouth at bedtime. 15 tablet 0   triamcinolone ointment (KENALOG) 0.5 % Apply topically to affected area twice daily as directed, (Patient not taking: Reported on 10/13/2022) 30 g 0   TYLENOL 500 MG tablet Take 1,000 mg by mouth every 6 (six) hours as needed for mild pain or headache.     valACYclovir (VALTREX) 500 MG tablet Take 1 tablet (500 mg) by mouth 2 times daily. (Patient taking differently: Take 500 mg by mouth daily as needed (for cold sores).) 60 tablet 2   No current facility-administered medications for this visit.    VITAL SIGNS: There were no vitals taken for this visit. There were no vitals filed for this visit.  Estimated body mass index is 18.25 kg/m as calculated from the following:   Height as of 10/13/22: 5\' 8"  (1.727 m).   Weight as of 10/13/22: 120 lb (54.4 kg).  LABS: CBC:    Component  Value Date/Time   WBC 15.1 (H) 10/30/2022 0346   HGB 12.6 10/30/2022 0346   HGB 11.4 (L) 09/16/2022 1415   HGB 11.2 07/02/2022 0832   HCT 39.0 10/30/2022 0346   HCT 34.0 07/02/2022 0832   PLT 101 (L) 10/30/2022 0346   PLT 119 (L) 09/16/2022 1415   PLT 149 (L) 07/02/2022 0832   MCV 97.0 10/30/2022 0346   MCV 102 (H) 07/02/2022 0832   NEUTROABS 2.8 10/13/2022 0425   NEUTROABS 3.3 07/02/2022 0832   LYMPHSABS 0.9 10/13/2022 0425   LYMPHSABS 2.1 07/02/2022 0832   MONOABS 0.6 10/13/2022 0425   EOSABS 0.1 10/13/2022 0425   EOSABS 0.3 07/02/2022 0832   BASOSABS 0.1 10/13/2022 0425   BASOSABS 0.1 07/02/2022 0832   Comprehensive Metabolic Panel:    Component Value Date/Time   NA 135 10/30/2022 0346   NA 141 07/12/2022 1114   K 3.6 10/30/2022 0346   CL 100 10/30/2022 0346   CO2 26 10/30/2022 0346   BUN 20 10/30/2022 0346   BUN 11 07/12/2022 1114   CREATININE 0.64 10/30/2022 0346   CREATININE 0.81 09/16/2022 1415   GLUCOSE 182 (H) 10/30/2022 0346   CALCIUM 8.4 (L) 10/30/2022 0346   AST 22 10/30/2022 0346   AST 18 09/16/2022 1415   ALT 35 10/30/2022 0346   ALT 68 (H) 09/16/2022 1415   ALKPHOS 118 10/30/2022 0346   BILITOT 0.9 10/30/2022 0346   BILITOT 0.6 09/16/2022 1415   PROT 5.1 (L) 10/30/2022 0346   PROT 5.9 (L) 07/12/2022 1114   ALBUMIN 2.4 (L) 10/30/2022 0346   ALBUMIN 3.8 (L) 07/12/2022 1114    RADIOGRAPHIC STUDIES: CT ABDOMEN PELVIS W CONTRAST  Result Date: 10/22/2022 CLINICAL DATA:  Infected pseudocyst. EXAM: CT ABDOMEN AND PELVIS WITH CONTRAST TECHNIQUE: Multidetector CT imaging of the abdomen and pelvis was performed using the standard protocol following bolus administration of intravenous contrast. RADIATION DOSE REDUCTION: This exam was performed according to the departmental dose-optimization program which includes automated exposure control, adjustment of the mA and/or kV according to patient size and/or use of iterative reconstruction technique. CONTRAST:   OMNIPAQUE IOHEXOL 300 MG/ML  SOLN COMPARISON:  October 05, 2022. FINDINGS: Lower chest: Minimal right pleural effusion is noted with minimal adjacent subsegmental atelectasis. Stable 5.6 x 4.0 cm mass noted medially in left lower lobe. Hepatobiliary: Large gallstone is again noted. No or biliary dilatation is noted. Liver is unremarkable.  Pancreas: 2.8 x 2.1 cm fluid collection is seen between pancreatic body and stomach which is slightly enlarged compared to prior exam and most consistent with pseudocyst. 14 x 13 mm low density is noted in pancreatic tail most consistent with pseudocyst. 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 Overall, this pseudocyst is significantly smaller, currently measuring 6.2 x 1.8 cm. Spleen: Normal in size without focal abnormality. Adrenals/Urinary Tract: Adrenal glands are unremarkable. Kidneys are normal, without renal calculi, focal lesion, or hydronephrosis. Bladder is unremarkable. Stomach/Bowel: As noted above, there is a drainage stent extending from pseudo cyst noted along greater curvature stomach into gastric lumen. There is no evidence of bowel obstruction or inflammation. Sigmoid diverticulosis is noted without inflammation. The appendix is unremarkable. Vascular/Lymphatic: Aortic atherosclerosis. No enlarged abdominal or pelvic lymph nodes. Reproductive: Status post hysterectomy. No adnexal masses. Other: No abdominal wall hernia or abnormality. No abdominopelvic ascites. Musculoskeletal: No acute or significant osseous findings. IMPRESSION: Largest pancreatic cyst noted along the greater curvature of stomach is significantly smaller status post placement of internal drainage stent into gastric lumen. However, there are 2 other probable pseudocysts that appear to be enlarged compared to prior exam, 1 measuring 28 x 21 mm between the pancreatic body and  stomach and another measuring 14 x 13 mm in the pancreatic tail. Large gallstone is noted. Minimal right pleural effusion is noted with minimal adjacent subsegmental atelectasis. Grossly stable left lower lobe mass is noted. Aortic Atherosclerosis (ICD10-I70.0). Electronically Signed   By: [name] M.D.   On: 10/22/2022 17:46    PERFORMANCE STATUS (ECOG) : {CHL ONC ECOG ZH:[id number]}  Review of Systems Unless otherwise noted, a complete review of systems is negative.  Physical Exam General: NAD Cardiovascular: regular rate and rhythm Pulmonary: clear ant fields Abdomen: soft, nontender, + bowel sounds Extremities: no edema, no joint deformities Skin: no rashes Neurological:  IMPRESSION: *** I introduced myself, Holmes Hays RN, and Palliative's role in collaboration with the oncology team. Concept of Palliative Care was introduced as specialized medical care for people and their families living with serious illness.  It focuses on providing relief from the symptoms and stress of a serious illness.  The goal is to improve quality of life for both the patient and the family. Values and goals of care important to patient and family were attempted to be elicited.    We discussed *** current illness and what it means in the larger context of *** on-going co-morbidities. Natural disease trajectory and expectations were discussed.  I discussed the importance of continued conversation with family and their medical providers regarding overall plan of care and treatment options, ensuring decisions are within the context of the patients values and GOCs.  PLAN: Established therapeutic relationship. Education provided on palliative's role in collaboration with their Oncology/Radiation team. I will plan to see patient back in 2-4 weeks in collaboration to other oncology appointments.    Patient expressed understanding and was in agreement with this plan. She also understands that She can call the  clinic at any time with any questions, concerns, or complaints.   Thank you for your referral and allowing Palliative to assist in Carolyn Hooper's care.   Number and complexity of problems addressed: ***HIGH - 1 or more chronic illnesses with SEVERE exacerbation, progression, or side effects of treatment - advanced cancer, pain. Any controlled substances utilized were prescribed in the  context of palliative care.   Visit consisted of counseling and education dealing with the complex and emotionally intense issues of symptom management and palliative care in the setting of serious and potentially life-threatening illness.Greater than 50%  of this time was spent counseling and coordinating care related to the above assessment and plan.  Signed by: Willette Alma, AGPCNP-BC Palliative Medicine Team/[facility]   *Please note that this is a verbal dictation therefore any spelling or grammatical errors are due to the "[employer] One" system interpretation.

## 2022-11-26 ENCOUNTER — Telehealth: Payer: Self-pay | Admitting: Medical Oncology

## 2022-11-26 NOTE — Telephone Encounter (Signed)
Pt passed away on Sept 3rd, 2024.

## 2022-11-26 NOTE — Telephone Encounter (Addendum)
Expressed our condolences to Carolyn Hooper and unable to contact son, Carolyn Hooper.

## 2022-11-27 ENCOUNTER — Inpatient Hospital Stay: Payer: BC Managed Care – PPO

## 2022-11-27 ENCOUNTER — Inpatient Hospital Stay: Payer: BC Managed Care – PPO | Admitting: Internal Medicine

## 2022-11-27 ENCOUNTER — Inpatient Hospital Stay: Payer: BC Managed Care – PPO | Admitting: Dietician

## 2022-12-12 ENCOUNTER — Ambulatory Visit (HOSPITAL_COMMUNITY)
Admission: RE | Admit: 2022-12-12 | Payer: BC Managed Care – PPO | Source: Home / Self Care | Admitting: Gastroenterology

## 2022-12-12 SURGERY — ESOPHAGOGASTRODUODENOSCOPY (EGD) WITH PROPOFOL
Anesthesia: Monitor Anesthesia Care

## 2022-12-18 ENCOUNTER — Ambulatory Visit: Payer: BC Managed Care – PPO | Admitting: Internal Medicine

## 2022-12-18 ENCOUNTER — Other Ambulatory Visit: Payer: BC Managed Care – PPO

## 2022-12-18 ENCOUNTER — Ambulatory Visit: Payer: BC Managed Care – PPO

## 2023-01-08 ENCOUNTER — Ambulatory Visit: Payer: BC Managed Care – PPO

## 2023-01-08 ENCOUNTER — Ambulatory Visit: Payer: BC Managed Care – PPO | Admitting: Internal Medicine

## 2023-01-08 ENCOUNTER — Other Ambulatory Visit: Payer: BC Managed Care – PPO

## 2023-02-04 ENCOUNTER — Other Ambulatory Visit (HOSPITAL_COMMUNITY): Payer: Self-pay

## 2023-05-07 ENCOUNTER — Other Ambulatory Visit (HOSPITAL_COMMUNITY): Payer: Self-pay

## 2023-06-20 IMAGING — MR MR HEAD WO/W CM
15 of 17 series · 35 of 48 positions shown · IV contrast (gadavist)
Comparison: 04/08/2011.

CLINICAL DATA: Fascicular schwannoma, assess treatment response

EXAM:
MRI HEAD WITHOUT AND WITH CONTRAST
TECHNIQUE: Multiplanar, multiecho pulse sequences of the brain and surrounding
structures were obtained without and with intravenous contrast.
CONTRAST:  10mL GADAVIST GADOBUTROL 1 MMOL/ML IV SOLN

[Series 5: DWI · axial · 3.0mm · 0.83mm/px · z∈[-59,+89]mm · 4 of 55 slices shown (1 of 6)]
[im 1/55]
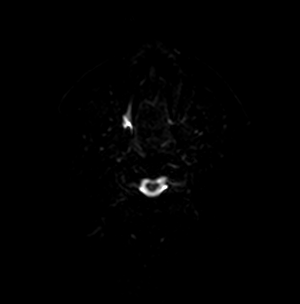
[im 19/55]
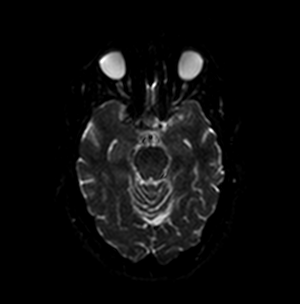
[im 37/55]
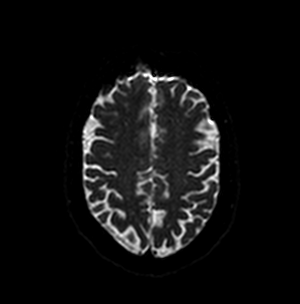
[im 55/55]
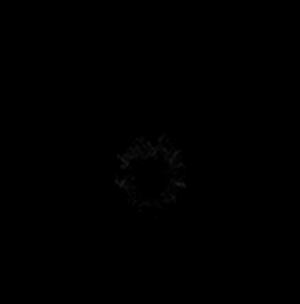

[Series 5: DWI · axial · 3.0mm · 0.83mm/px · z∈[-59,+89]mm · 3 of 55 slices shown (2 of 6)]
[im 1/55]
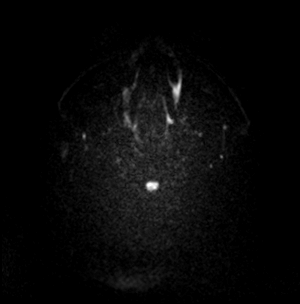
[im 28/55]
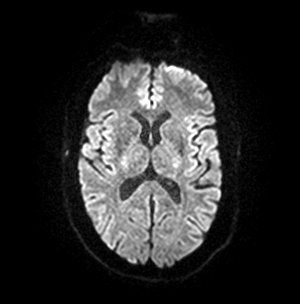
[im 55/55]
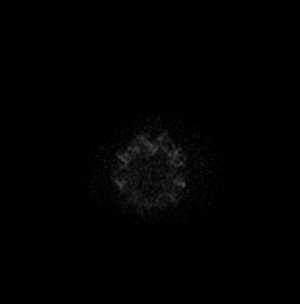

[Series 6: DWI · axial · 3.0mm · 0.83mm/px · z∈[-59,+89]mm · 3 of 55 slices shown (3 of 6)]
[im 1/55]
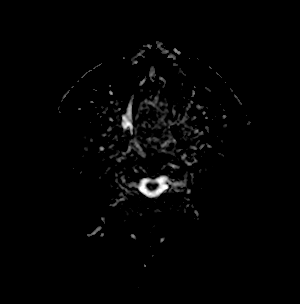
[im 28/55]
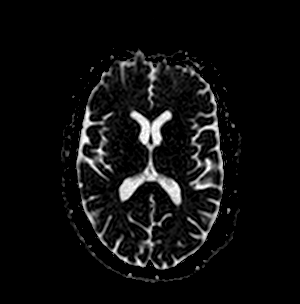
[im 55/55]
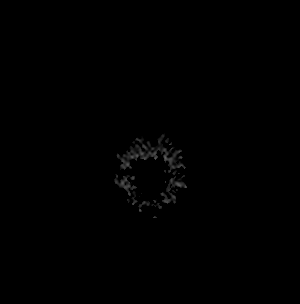

[Series 7: DWI · coronal · 5.0mm · 0.88mm/px · 2 of 30 slices shown (4 of 6)]
[im 1/30]
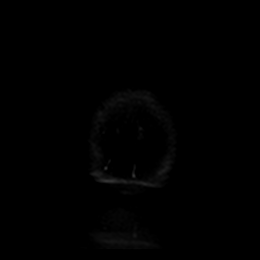
[im 30/30]
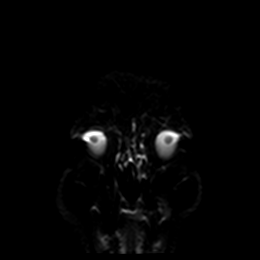

[Series 7: DWI · coronal · 5.0mm · 0.88mm/px · 2 of 30 slices shown (5 of 6)]
[im 1/30]
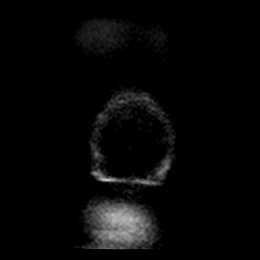
[im 30/30]
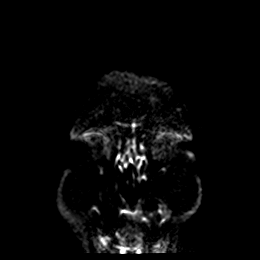

[Series 8: DWI · coronal · 5.0mm · 0.88mm/px · 2 of 30 slices shown (6 of 6)]
[im 1/30]
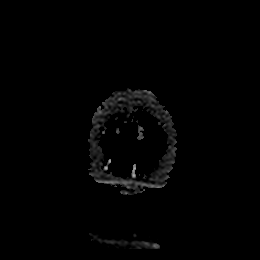
[im 30/30]
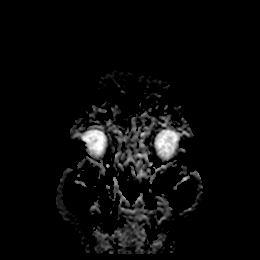

[Series 9: T1 · sagittal · 5.0mm · 0.75mm/px · 1 of 21 slices shown (1 of 2)]
[im 1/21]
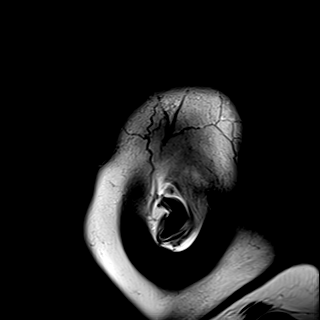

[Series 10: T2 · axial · 5.0mm · 0.75mm/px · 1 of 24 slices shown (1 of 2)]
[im 1/24]
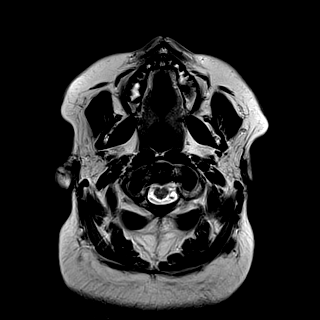

[Series 11: mag_images · axial · 3.0mm · 0.94mm/px · z∈[-61,+90]mm · 3 of 56 slices shown]
[im 1/56]
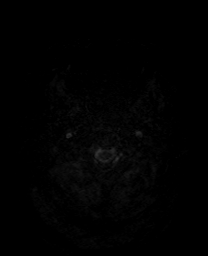
[im 28/56]
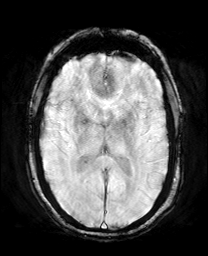
[im 56/56]
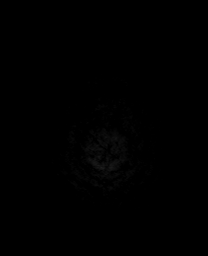

[Series 12: pha_images · axial · 3.0mm · 0.94mm/px · z∈[-61,+90]mm · 3 of 56 slices shown]
[im 1/56]
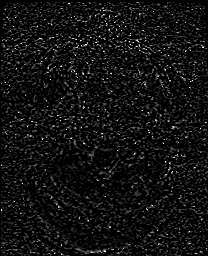
[im 28/56]
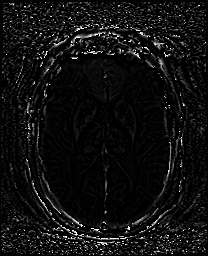
[im 56/56]
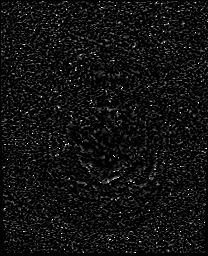

[Series 13: swi_images · axial · 3.0mm · 0.94mm/px · z∈[-61,+90]mm · 3 of 56 slices shown]
[im 1/56]
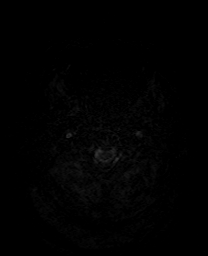
[im 28/56]
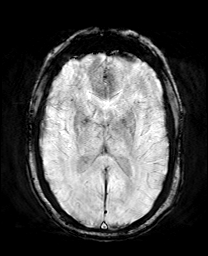
[im 56/56]
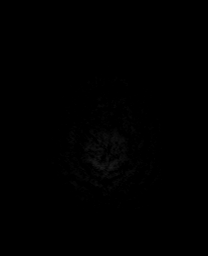

[Series 15: FLAIR · axial · 3.0mm · 0.47mm/px · z∈[-59,+87]mm · 3 of 54 slices shown]
[im 1/54]
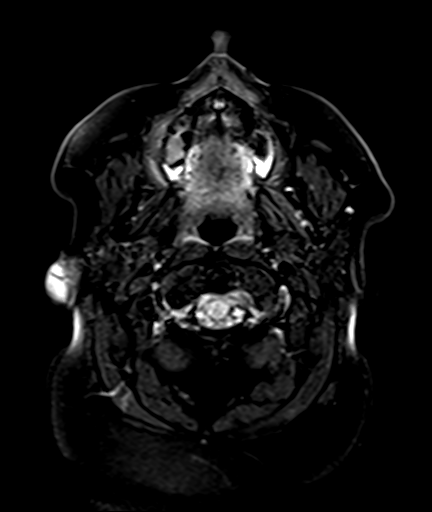
[im 27/54]
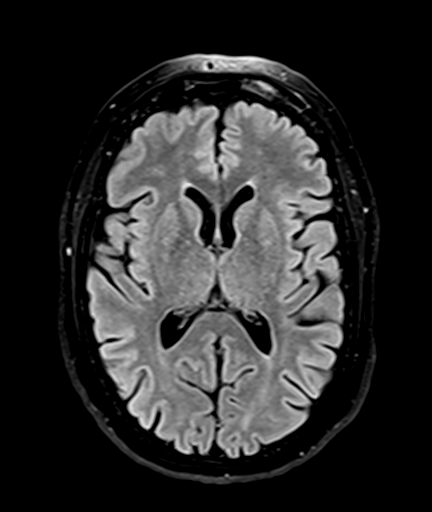
[im 54/54]
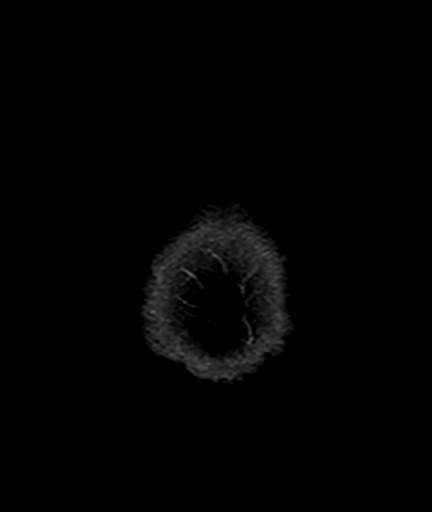

[Series 17: T2 · coronal · 5.0mm · 0.72mm/px · 2 of 28 slices shown (2 of 2)]
[im 1/28]
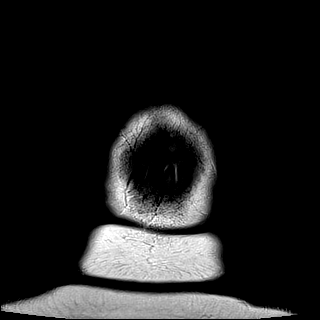
[im 28/28]
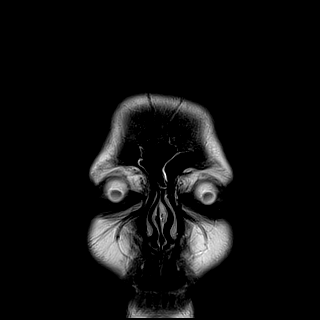

[Series 19: T1 post-contrast · coronal · 5.0mm · 0.34mm/px · 2 of 29 slices shown]
[im 1/29]
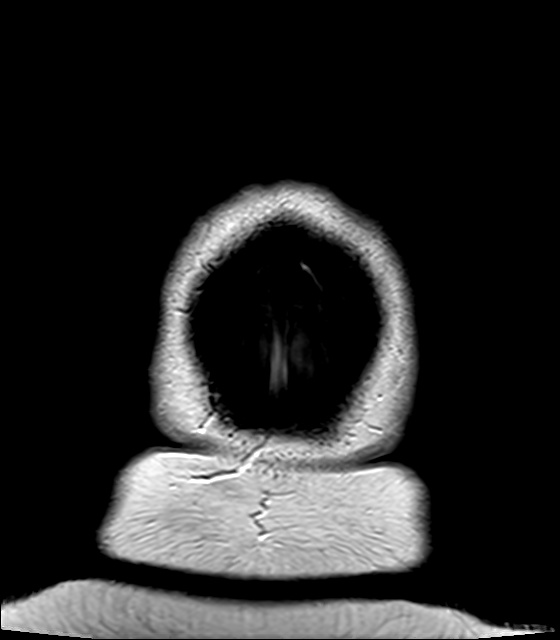
[im 29/29]
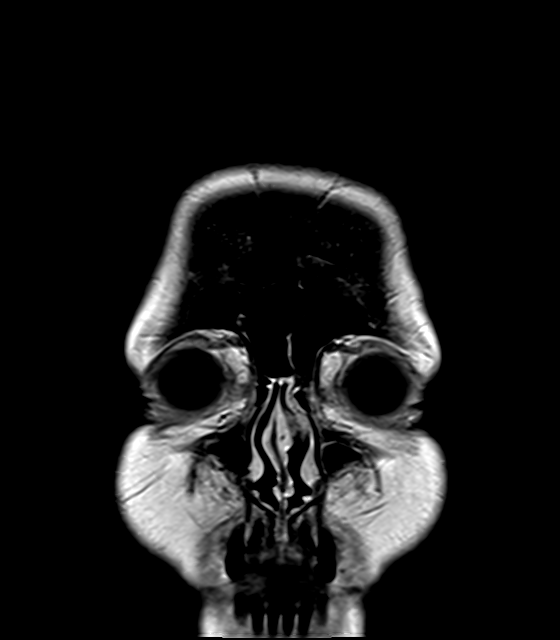

[Series 20: T1 · sagittal · 5.0mm · 0.94mm/px · 1 of 25 slices shown (2 of 2)]
[im 1/25]
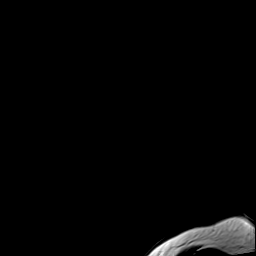

[35 of 48 positions shown; findings below may reference images not displayed]

FINDINGS: Brain: Rounded enhancing mass in the right internal auditory canal,
which appears associated with the right seventh and eighth nerve
segments, now measuring 5 x 3 x 2 mm (series 18, image 16 and series
19, image 15), previously 6 x 4 x 5 mm when remeasured similarly.
The mass does not extend into the right cerebellopontine angle. No
new abnormal enhancement is seen.

No restricted diffusion to suggest acute infarct. No acute
hemorrhage, mass, mass effect, or midline shift. No hydrocephalus or
extra-axial collection. Minimal T2 hyperintense signal in the
periventricular white matter, likely the sequela of mild chronic
small vessel ischemic disease.

Vascular: Normal flow voids.

Skull and upper cervical spine: Normal marrow signal.

Sinuses/Orbits: Negative.

Other: Trace fluid in the bilateral mastoid air cells.
IMPRESSION: 1. Redemonstrated right IAC vestibular schwannoma, which has
decreased in size compared to 5474.

2.  No acute intracranial process.
